# Patient Record
Sex: Female | Born: 1935 | Race: White | Hispanic: No | Smoking: Never smoker
Health system: Southern US, Community
[De-identification: ages and names within clinical notes are randomized; demographics above are authoritative.]

## PROBLEM LIST (undated history)

## (undated) DIAGNOSIS — E785 Hyperlipidemia, unspecified: Secondary | ICD-10-CM

## (undated) DIAGNOSIS — R011 Cardiac murmur, unspecified: Secondary | ICD-10-CM

## (undated) DIAGNOSIS — Z950 Presence of cardiac pacemaker: Secondary | ICD-10-CM

## (undated) DIAGNOSIS — F32A Depression, unspecified: Secondary | ICD-10-CM

## (undated) DIAGNOSIS — M179 Osteoarthritis of knee, unspecified: Secondary | ICD-10-CM

## (undated) DIAGNOSIS — I499 Cardiac arrhythmia, unspecified: Secondary | ICD-10-CM

## (undated) DIAGNOSIS — M1710 Unilateral primary osteoarthritis, unspecified knee: Secondary | ICD-10-CM

## (undated) DIAGNOSIS — C801 Malignant (primary) neoplasm, unspecified: Secondary | ICD-10-CM

## (undated) DIAGNOSIS — Z86010 Personal history of colonic polyps: Secondary | ICD-10-CM

## (undated) DIAGNOSIS — Z860101 Personal history of adenomatous and serrated colon polyps: Secondary | ICD-10-CM

## (undated) DIAGNOSIS — F329 Major depressive disorder, single episode, unspecified: Secondary | ICD-10-CM

## (undated) HISTORY — PX: CHOLECYSTECTOMY: SHX55

## (undated) HISTORY — DX: Personal history of colonic polyps: Z86.010

## (undated) HISTORY — DX: Personal history of adenomatous and serrated colon polyps: Z86.0101

## (undated) HISTORY — DX: Hyperlipidemia, unspecified: E78.5

## (undated) HISTORY — PX: CHOLECYSTECTOMY, LAPAROSCOPIC: SHX56

## (undated) HISTORY — DX: Depression, unspecified: F32.A

## (undated) HISTORY — DX: Osteoarthritis of knee, unspecified: M17.9

## (undated) HISTORY — PX: KNEE ARTHROSCOPY: SHX127

## (undated) HISTORY — PX: ATRIAL FLUTTER ABLATION: SHX5733

## (undated) HISTORY — PX: TOOTH EXTRACTION: SUR596

## (undated) HISTORY — DX: Unilateral primary osteoarthritis, unspecified knee: M17.10

## (undated) HISTORY — DX: Major depressive disorder, single episode, unspecified: F32.9

## (undated) HISTORY — PX: OTHER SURGICAL HISTORY: SHX169

---

## 1998-01-10 ENCOUNTER — Emergency Department (HOSPITAL_COMMUNITY): Admission: EM | Admit: 1998-01-10 | Discharge: 1998-01-10 | Payer: Self-pay | Admitting: Emergency Medicine

## 2000-04-02 ENCOUNTER — Other Ambulatory Visit: Admission: RE | Admit: 2000-04-02 | Discharge: 2000-04-02 | Payer: Self-pay | Admitting: Obstetrics & Gynecology

## 2001-04-07 ENCOUNTER — Other Ambulatory Visit: Admission: RE | Admit: 2001-04-07 | Discharge: 2001-04-07 | Payer: Self-pay | Admitting: Obstetrics & Gynecology

## 2002-06-30 ENCOUNTER — Other Ambulatory Visit: Admission: RE | Admit: 2002-06-30 | Discharge: 2002-06-30 | Payer: Self-pay | Admitting: Obstetrics & Gynecology

## 2002-12-21 ENCOUNTER — Other Ambulatory Visit: Admission: RE | Admit: 2002-12-21 | Discharge: 2002-12-21 | Payer: Self-pay | Admitting: Obstetrics & Gynecology

## 2003-02-26 ENCOUNTER — Ambulatory Visit (HOSPITAL_COMMUNITY): Admission: RE | Admit: 2003-02-26 | Discharge: 2003-02-26 | Payer: Self-pay | Admitting: Gastroenterology

## 2003-07-02 ENCOUNTER — Other Ambulatory Visit: Admission: RE | Admit: 2003-07-02 | Discharge: 2003-07-02 | Payer: Self-pay | Admitting: Obstetrics & Gynecology

## 2004-03-27 ENCOUNTER — Other Ambulatory Visit: Admission: RE | Admit: 2004-03-27 | Discharge: 2004-03-27 | Payer: Self-pay | Admitting: Obstetrics & Gynecology

## 2004-06-15 ENCOUNTER — Encounter (INDEPENDENT_AMBULATORY_CARE_PROVIDER_SITE_OTHER): Payer: Self-pay | Admitting: *Deleted

## 2004-06-15 ENCOUNTER — Ambulatory Visit (HOSPITAL_COMMUNITY): Admission: RE | Admit: 2004-06-15 | Discharge: 2004-06-15 | Payer: Self-pay | Admitting: Obstetrics & Gynecology

## 2004-11-28 ENCOUNTER — Other Ambulatory Visit: Admission: RE | Admit: 2004-11-28 | Discharge: 2004-11-28 | Payer: Self-pay | Admitting: Obstetrics & Gynecology

## 2005-01-16 ENCOUNTER — Encounter (INDEPENDENT_AMBULATORY_CARE_PROVIDER_SITE_OTHER): Payer: Self-pay | Admitting: Specialist

## 2005-01-16 ENCOUNTER — Ambulatory Visit (HOSPITAL_COMMUNITY): Admission: RE | Admit: 2005-01-16 | Discharge: 2005-01-16 | Payer: Self-pay | Admitting: General Surgery

## 2005-07-19 ENCOUNTER — Other Ambulatory Visit: Admission: RE | Admit: 2005-07-19 | Discharge: 2005-07-19 | Payer: Self-pay | Admitting: Obstetrics & Gynecology

## 2005-11-19 ENCOUNTER — Encounter: Admission: RE | Admit: 2005-11-19 | Discharge: 2005-11-19 | Payer: Self-pay | Admitting: Internal Medicine

## 2005-11-26 ENCOUNTER — Encounter: Admission: RE | Admit: 2005-11-26 | Discharge: 2005-11-26 | Payer: Self-pay | Admitting: Internal Medicine

## 2005-12-07 ENCOUNTER — Encounter: Admission: RE | Admit: 2005-12-07 | Discharge: 2005-12-07 | Payer: Self-pay | Admitting: Internal Medicine

## 2008-03-30 LAB — HM COLONOSCOPY

## 2008-06-11 HISTORY — PX: BREAST EXCISIONAL BIOPSY: SUR124

## 2009-02-22 ENCOUNTER — Encounter (INDEPENDENT_AMBULATORY_CARE_PROVIDER_SITE_OTHER): Payer: Self-pay | Admitting: Diagnostic Radiology

## 2009-02-22 ENCOUNTER — Encounter: Admission: RE | Admit: 2009-02-22 | Discharge: 2009-02-22 | Payer: Self-pay | Admitting: Obstetrics & Gynecology

## 2009-03-02 ENCOUNTER — Encounter: Admission: RE | Admit: 2009-03-02 | Discharge: 2009-03-02 | Payer: Self-pay | Admitting: Obstetrics & Gynecology

## 2009-03-21 ENCOUNTER — Encounter: Admission: RE | Admit: 2009-03-21 | Discharge: 2009-03-21 | Payer: Self-pay | Admitting: Surgery

## 2009-03-22 ENCOUNTER — Ambulatory Visit (HOSPITAL_BASED_OUTPATIENT_CLINIC_OR_DEPARTMENT_OTHER): Admission: RE | Admit: 2009-03-22 | Discharge: 2009-03-22 | Payer: Self-pay | Admitting: Surgery

## 2009-03-22 ENCOUNTER — Encounter: Admission: RE | Admit: 2009-03-22 | Discharge: 2009-03-22 | Payer: Self-pay | Admitting: Surgery

## 2009-03-22 ENCOUNTER — Encounter (INDEPENDENT_AMBULATORY_CARE_PROVIDER_SITE_OTHER): Payer: Self-pay | Admitting: Surgery

## 2009-03-25 ENCOUNTER — Ambulatory Visit: Payer: Self-pay | Admitting: Oncology

## 2009-03-30 LAB — CBC WITH DIFFERENTIAL/PLATELET
Basophils Absolute: 0.1 10*3/uL (ref 0.0–0.1)
EOS%: 3.7 % (ref 0.0–7.0)
HGB: 13.5 g/dL (ref 11.6–15.9)
MCH: 35.3 pg — ABNORMAL HIGH (ref 25.1–34.0)
NEUT#: 5.3 10*3/uL (ref 1.5–6.5)
RDW: 13.1 % (ref 11.2–14.5)
WBC: 8.4 10*3/uL (ref 3.9–10.3)
lymph#: 1.7 10*3/uL (ref 0.9–3.3)

## 2009-03-30 LAB — COMPREHENSIVE METABOLIC PANEL
AST: 18 U/L (ref 0–37)
Albumin: 3.3 g/dL — ABNORMAL LOW (ref 3.5–5.2)
Alkaline Phosphatase: 57 U/L (ref 39–117)
BUN: 15 mg/dL (ref 6–23)
Creatinine, Ser: 0.69 mg/dL (ref 0.40–1.20)
Glucose, Bld: 93 mg/dL (ref 70–99)
Potassium: 3.9 mEq/L (ref 3.5–5.3)
Total Bilirubin: 1 mg/dL (ref 0.3–1.2)

## 2009-03-31 LAB — VITAMIN D 25 HYDROXY (VIT D DEFICIENCY, FRACTURES): Vit D, 25-Hydroxy: 34 ng/mL (ref 30–89)

## 2009-03-31 LAB — CANCER ANTIGEN 27.29: CA 27.29: 15 U/mL (ref 0–39)

## 2009-06-11 DIAGNOSIS — C50919 Malignant neoplasm of unspecified site of unspecified female breast: Secondary | ICD-10-CM | POA: Insufficient documentation

## 2009-07-29 ENCOUNTER — Ambulatory Visit: Payer: Self-pay | Admitting: Oncology

## 2009-08-25 ENCOUNTER — Ambulatory Visit: Payer: Self-pay | Admitting: Internal Medicine

## 2009-09-04 DIAGNOSIS — F329 Major depressive disorder, single episode, unspecified: Secondary | ICD-10-CM

## 2009-09-04 DIAGNOSIS — E785 Hyperlipidemia, unspecified: Secondary | ICD-10-CM

## 2009-09-04 DIAGNOSIS — R609 Edema, unspecified: Secondary | ICD-10-CM | POA: Insufficient documentation

## 2009-09-04 DIAGNOSIS — K8020 Calculus of gallbladder without cholecystitis without obstruction: Secondary | ICD-10-CM | POA: Insufficient documentation

## 2009-09-04 DIAGNOSIS — Z860100 Personal history of colon polyps, unspecified: Secondary | ICD-10-CM | POA: Insufficient documentation

## 2009-09-04 DIAGNOSIS — M1710 Unilateral primary osteoarthritis, unspecified knee: Secondary | ICD-10-CM

## 2009-09-04 DIAGNOSIS — M179 Osteoarthritis of knee, unspecified: Secondary | ICD-10-CM | POA: Insufficient documentation

## 2009-09-04 DIAGNOSIS — D0590 Unspecified type of carcinoma in situ of unspecified breast: Secondary | ICD-10-CM

## 2009-10-14 ENCOUNTER — Ambulatory Visit: Payer: Self-pay | Admitting: Internal Medicine

## 2010-01-25 ENCOUNTER — Telehealth: Payer: Self-pay | Admitting: Internal Medicine

## 2010-01-26 ENCOUNTER — Encounter: Payer: Self-pay | Admitting: Internal Medicine

## 2010-01-26 ENCOUNTER — Ambulatory Visit: Payer: Self-pay | Admitting: Internal Medicine

## 2010-01-26 DIAGNOSIS — M549 Dorsalgia, unspecified: Secondary | ICD-10-CM

## 2010-01-26 DIAGNOSIS — N390 Urinary tract infection, site not specified: Secondary | ICD-10-CM

## 2010-01-26 LAB — CONVERTED CEMR LAB
Nitrite: NEGATIVE
Total Protein, Urine: 100 mg/dL
pH: 6.5 (ref 5.0–8.0)

## 2010-03-23 ENCOUNTER — Encounter: Admission: RE | Admit: 2010-03-23 | Discharge: 2010-03-23 | Payer: Self-pay | Admitting: Obstetrics & Gynecology

## 2010-04-11 ENCOUNTER — Ambulatory Visit: Payer: Self-pay | Admitting: Oncology

## 2010-04-13 LAB — COMPREHENSIVE METABOLIC PANEL
ALT: 17 U/L (ref 0–35)
AST: 19 U/L (ref 0–37)
Alkaline Phosphatase: 44 U/L (ref 39–117)
BUN: 13 mg/dL (ref 6–23)
Chloride: 106 mEq/L (ref 96–112)
Creatinine, Ser: 0.81 mg/dL (ref 0.40–1.20)

## 2010-04-13 LAB — CBC WITH DIFFERENTIAL/PLATELET
BASO%: 0.5 % (ref 0.0–2.0)
Basophils Absolute: 0.1 10*3/uL (ref 0.0–0.1)
EOS%: 0.6 % (ref 0.0–7.0)
HCT: 40 % (ref 34.8–46.6)
HGB: 13.9 g/dL (ref 11.6–15.9)
MCH: 34.7 pg — ABNORMAL HIGH (ref 25.1–34.0)
MCHC: 34.7 g/dL (ref 31.5–36.0)
MCV: 100 fL (ref 79.5–101.0)
MONO%: 9.2 % (ref 0.0–14.0)
NEUT%: 67.7 % (ref 38.4–76.8)
RDW: 13.4 % (ref 11.2–14.5)
lymph#: 2.4 10*3/uL (ref 0.9–3.3)

## 2010-04-20 ENCOUNTER — Encounter: Payer: Self-pay | Admitting: Internal Medicine

## 2010-07-13 NOTE — Assessment & Plan Note (Signed)
Summary: acute pain in lower back/norins/pt walk in/lb   Vital Signs:  Patient profile:   75 year old female Height:      63 inches Weight:      161 pounds BMI:     28.62 O2 Sat:      97 % on Room air Temp:     99.5 degrees F oral Pulse rate:   75 / minute Pulse rhythm:   regular Resp:     16 per minute BP sitting:   120 / 78  (left arm) Cuff size:   regular  Vitals Entered By: Bill Salinas CMA (January 26, 2010 9:27 AM)  Nutrition Counseling: Patient's BMI is greater than 25 and therefore counseled on weight management options.  O2 Flow:  Room air CC: pt here with c/o low back pain/ ab, Back pain, Dysuria Is Patient Diabetic? No Pain Assessment Patient in pain? yes     Location: lower back Intensity: 3 Type: dull Onset of pain  today   Primary Care Provider:  Jacques Navy MD  CC:  pt here with c/o low back pain/ ab, Back pain, and Dysuria.  History of Present Illness:  Back Pain      This is a 75 year old woman who presents with Back pain.  The symptoms began 1 day ago.  The intensity is described as moderate.  The patient reports dysuria, but denies fever, chills, weakness, loss of sensation, fecal incontinence, urinary incontinence, urinary retention, rest pain, inability to work, and inability to care for self.  The pain is located in the mid low back.  The pain began at home and gradually.  The pain is made worse by activity.  The pain is made better by inactivity.  Risk factors for serious underlying conditions include age >= 50 years and history of osteoporosis.    Dysuria      The patient also presents with Dysuria.  The symptoms began 2 days ago.  The intensity is described as mild.  The patient reports burning with urination, urinary frequency, and urgency, but denies hematuria, vaginal discharge, vaginal itching, and vaginal sores.  Associated symptoms include back pain.  The patient denies the following associated symptoms: nausea, vomiting, fever, shaking  chills, flank pain, abdominal pain, pelvic pain, and arthralgias.  The patient denies the following risk factors: diabetes, prior antibiotics, immunosuppression, history of GU anomaly, history of pyelonephritis, pregnancy, history of STD, and analgesic abuse.  History is significant for no urinary tract problems.    Preventive Screening-Counseling & Management  Alcohol-Tobacco     Alcohol drinks/day: 0     Smoking Status: never  Hep-HIV-STD-Contraception     Hepatitis Risk: no risk noted     HIV Risk: no risk noted     STD Risk: no risk noted      Sexual History:  currently monogamous.        Drug Use:  never.        Blood Transfusions:  no.    Medications Prior to Update: 1)  Zocor 5 Mg Tabs (Simvastatin) .Marland Kitchen.. 1 Daily 2)  Tamoxifen Citrate 20 Mg Tabs (Tamoxifen Citrate) .Marland Kitchen.. 1 Tab Daily 3)  Fluoxetine Hcl 10 Mg Caps (Fluoxetine Hcl) .Marland Kitchen.. 1 Cap Daily 4)  Triamterene-Hctz 37.5-25 Mg Tabs (Triamterene-Hctz) .... Every Other Day  Current Medications (verified): 1)  Zocor 5 Mg Tabs (Simvastatin) .Marland Kitchen.. 1 Daily 2)  Tamoxifen Citrate 20 Mg Tabs (Tamoxifen Citrate) .Marland Kitchen.. 1 Tab Daily 3)  Fluoxetine Hcl 10 Mg Caps (  Fluoxetine Hcl) .Marland Kitchen.. 1 Cap Daily 4)  Triamterene-Hctz 37.5-25 Mg Tabs (Triamterene-Hctz) .... Every Other Day 5)  Ciprofloxacin Hcl 250 Mg Tabs (Ciprofloxacin Hcl) .... One By Mouth Two Times A Day For 7 Days 6)  Tramadol Hcl 50 Mg Tabs (Tramadol Hcl) .Marland Kitchen.. 1-2 By Mouth Qid As Needed For Pain  Allergies (verified): No Known Drug Allergies  Past History:  Past Medical History: Last updated: 09/19/2009 UCD  CARCINOMA IN SITU OF BREAST (ICD-233.0) COLONIC POLYPS, ADENOMATOUS, HX OF (ICD-V12.72) OTHER AND UNSPECIFIED HYPERLIPIDEMIA (ICD-272.4) DEPRESSION, PROLONGED (ICD-309.1) Hx of CHOLELITHIASIS (ICD-574.20) DEGENERATIVE JOINT DISEASE, KNEES, BILATERAL (EAV-409.81)  Past Surgical History: Last updated: 2009/09/19 lumpectomy -remote: benign Arthroscopy-left knee '00  (Rendal) Arthroscopy - right knee '08 Lap chole - '06 [facility] Amos) Left breast lumpectomy - Oct '10  G3P3  Family History: Last updated: 09-19-09 Father - deceased @ 57: liver cancer Mother - deceased @ 15: NIDDM, CAD/MI-fatal Neg - breast or colon cancer  Social History: Last updated: 19-Sep-2009 HSG Married '59 3 daughters - '61, '64, '73; 2 grandchildren work - Museum/gallery exhibitions officer x 20 years, 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 Marriage is in good heatlh exercise - goes to gym 5/wk  Risk Factors: Alcohol Use: 0 (01/26/2010) Diet: pt planning to start new diet changes under recommendation of personal trainer (10/14/2009) Exercise: yes (10/14/2009)  Risk Factors: Smoking Status: never (01/26/2010)  Family History: Reviewed history from 19-Sep-2009 and no changes required. Father - deceased @ 23: liver cancer Mother - deceased @ 20: NIDDM, CAD/MI-fatal Neg - breast or colon cancer  Social History: Reviewed history from 09/19/09 and no changes required. 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 Marriage is in good heatlh exercise - goes to gym 5/wk Hepatitis Risk:  no risk noted HIV Risk:  no risk noted STD Risk:  no risk noted Sexual History:  currently monogamous Drug Use:  never Blood Transfusions:  no  Review of Systems  The patient denies anorexia, fever, weight loss, weight gain, chest pain, syncope, dyspnea on exertion, peripheral edema, prolonged cough, headaches, hemoptysis, abdominal pain, melena, hematuria, incontinence, suspicious skin lesions, difficulty walking, abnormal bleeding, enlarged lymph nodes, and angioedema.    Physical Exam  General:  alert, well-developed, well-nourished, well-hydrated, appropriate dress, normal appearance, healthy-appearing, cooperative to examination, good hygiene, and overweight-appearing.   Head:  normocephalic, atraumatic, no abnormalities observed,  and no abnormalities palpated.   Eyes:  vision grossly intact, pupils equal, pupils round, and pupils reactive to light.   Ears:  R ear normal and L ear normal.   Nose:  External nasal examination shows no deformity or inflammation. Nasal mucosa are pink and moist without lesions or exudates. Mouth:  Oral mucosa and oropharynx without lesions or exudates.  Teeth in good repair. Neck:  supple, full ROM, no masses, no thyromegaly, no thyroid nodules or tenderness, and normal carotid upstroke.   Lungs:  normal respiratory effort, no intercostal retractions, no accessory muscle use, normal breath sounds, no dullness, no fremitus, no crackles, and no wheezes.   Heart:  normal rate, regular rhythm, no murmur, no gallop, no rub, and no JVD.   Abdomen:  soft, non-tender, normal bowel sounds, no distention, no masses, no guarding, no rigidity, no rebound tenderness, no abdominal hernia, no inguinal hernia, no hepatomegaly, and no splenomegaly.  No CVAT. Msk:  normal ROM, no joint tenderness, no joint swelling, no joint warmth, no redness over joints, no joint deformities, no joint instability, no crepitation,  and no muscle atrophy.   Pulses:  R and L carotid,radial,femoral,dorsalis pedis and posterior tibial pulses are full and equal bilaterally Extremities:  No clubbing, cyanosis, edema, or deformity noted with normal full range of motion of all joints.   Neurologic:  No cranial nerve deficits noted. Station and gait are normal. Plantar reflexes are down-going bilaterally. DTRs are symmetrical throughout. Sensory, motor and coordinative functions appear intact. Skin:  turgor normal, color normal, no rashes, no suspicious lesions, no ecchymoses, no petechiae, no purpura, no ulcerations, and no edema.   Cervical Nodes:  no anterior cervical adenopathy and no posterior cervical adenopathy.   Axillary Nodes:  no R axillary adenopathy and no L axillary adenopathy.   Inguinal Nodes:  no R inguinal adenopathy and  no L inguinal adenopathy.   Psych:  Cognition and judgment appear intact. Alert and cooperative with normal attention span and concentration. No apparent delusions, illusions, hallucinations   Detailed Back/Spine Exam  General:    obese.    Gait:    Normal heel-toe gait pattern bilaterally.    Lumbosacral Exam:  Inspection-deformity:    Normal Palpation-spinal tenderness:  Normal Range of Motion:    Forward Flexion:   85 degrees    Hyperextension:   30 degrees    Right Lateral Bend:   30 degrees    Left Lateral Bend:   30 degrees Squatting:  normal Lying Straight Leg Raise:    Right:  negative    Left:  negative Sitting Straight Leg Raise:    Right:  negative    Left:  negative Contralateral Straight Leg Raise:    Right:  negative    Left:  negative Sciatic Notch:    There is no sciatic notch tenderness. Toe Walking:    Right:  normal    Left:  normal Heel Walking:    Right:  normal    Left:  normal   Impression & Recommendations:  Problem # 1:  UTI (ICD-599.0)  UA shows TNTC WBC's and some RBC's Her updated medication list for this problem includes:    Ciprofloxacin Hcl 250 Mg Tabs (Ciprofloxacin hcl) ..... One by mouth two times a day for 7 days  Encouraged to push clear liquids, get enough rest, and take acetaminophen as needed. To be seen in 10 days if no improvement, sooner if worse.  Problem # 2:  BACK PAIN (ICD-724.5)  Orders: T-Lumbar Spine Complete, 5 Views (81191YN) Admin of Therapeutic Inj  intramuscular or subcutaneous (82956) Ketorolac-Toradol 15mg  (O1308)  Her updated medication list for this problem includes:    Tramadol Hcl 50 Mg Tabs (Tramadol hcl) .Marland Kitchen... 1-2 by mouth qid as needed for pain  Discussed use of moist heat or ice, modified activities, medications, and stretching/strengthening exercises. Back care instructions given. To be seen in 2 weeks if no improvement; sooner if worsening of symptoms.   Complete Medication List: 1)   Zocor 5 Mg Tabs (Simvastatin) .Marland Kitchen.. 1 daily 2)  Tamoxifen Citrate 20 Mg Tabs (Tamoxifen citrate) .Marland Kitchen.. 1 tab daily 3)  Fluoxetine Hcl 10 Mg Caps (Fluoxetine hcl) .Marland Kitchen.. 1 cap daily 4)  Triamterene-hctz 37.5-25 Mg Tabs (Triamterene-hctz) .... Every other day 5)  Ciprofloxacin Hcl 250 Mg Tabs (Ciprofloxacin hcl) .... One by mouth two times a day for 7 days 6)  Tramadol Hcl 50 Mg Tabs (Tramadol hcl) .Marland Kitchen.. 1-2 by mouth qid as needed for pain  Patient Instructions: 1)  Please schedule a follow-up appointment in 2 weeks. 2)  Take 650-1000mg  of Tylenol every  4-6 hours as needed for relief of pain or comfort of fever AVOID taking more than 4000mg   in a 24 hour period (can cause liver damage in higher doses). 3)  Take 400-600mg  of Ibuprofen (Advil, Motrin) with food every 4-6 hours as needed for relief of pain or comfort of fever. 4)  Take your antibiotic as prescribed until ALL of it is gone, but stop if you develop a rash or swelling and contact our office as soon as possible. 5)  Most patients (90%) with low back pain will improve with time (2-6 weeks). Keep active but avoid activities that are painful. Apply moist heat and/or ice to lower back several times a day. Prescriptions: TRAMADOL HCL 50 MG TABS (TRAMADOL HCL) 1-2 by mouth QID as needed for pain  #35 x 1   Entered and Authorized by:   Etta Grandchild MD   Signed by:   Etta Grandchild MD on 01/26/2010   Method used:   Print then Give to Patient   RxID:   [id number] CIPROFLOXACIN HCL 250 MG TABS (CIPROFLOXACIN HCL) One by mouth two times a day for 7 days  #14 x 0   Entered and Authorized by:   Etta Grandchild MD   Signed by:   Etta Grandchild MD on 01/26/2010   Method used:   Print then Give to Patient   RxID:   [id number]    Immunization History:  Tetanus/Td Immunization History:    Tetanus/Td:  historical (08/11/2002)  Influenza Immunization History:    Influenza:  historical (03/15/2009)   Medication  Administration  Injection # 1:    Medication: Ketorolac-Toradol 15mg     Diagnosis: BACK PAIN (ICD-724.5)    Route: IM    Site: RUOQ gluteus    Exp Date: 10/10/2010    Lot #: [id number]    Mfr: [email or web address]    Comments: Patient got 60mg     Patient tolerated injection without complications    Given by: Rock Nephew CMA (January 26, 2010 10:04 AM)  Orders Added: 1)  T-Lumbar Spine Complete, 5 Views [71110TC] 2)  Admin of Therapeutic Inj  intramuscular or subcutaneous [96372] 3)  Ketorolac-Toradol 15mg  [J1885] 4)  Est. Patient Level IV [64403]

## 2010-07-13 NOTE — Assessment & Plan Note (Signed)
Summary: re-establish it has been a while per pt/cd - ok MEN.   Vital Signs:  Patient profile:   75 year old female Height:      63 inches Weight:      170 pounds BMI:     30.22 O2 Sat:      98 % on Room air Temp:     97.9 degrees F oral Pulse rate:   70 / minute BP sitting:   124 / 72  (left arm) Cuff size:   regular  Vitals Entered By: Bill Salinas CMA (August 25, 2009 11:55 AM)  O2 Flow:  Room air CC: pt here t re- est care with primary/ ab   CC:  pt here t re- est care with primary/ ab.  History of Present Illness: Ms. Delia presents on the recommendation of her oncologist, Dr. Darnelle Catalan, to reestablish for on-going continuity care. She was last seens April '00. In the interval she has been diagnosed and treated for breast cancer with lumpectomy and adjuvant therapy. By her report she has not had any other major medical problems and is doing well at this time. Her medical record is pulled from storage for complete review in addition to a full history interview today.   Current Medications (verified): 1)  Zocor 5 Mg Tabs (Simvastatin) .Marland Kitchen.. 1 Daily 2)  Tamoxifen Citrate 20 Mg Tabs (Tamoxifen Citrate) .Marland Kitchen.. 1 Tab Daily 3)  Fluoxetine Hcl 10 Mg Caps (Fluoxetine Hcl) .Marland Kitchen.. 1 Cap Daily 4)  Triamterene-Hctz 37.5-25 Mg Tabs (Triamterene-Hctz) .... Every Other Day  Allergies (verified): No Known Drug Allergies  Past History:  Past Medical History: UCD  CARCINOMA IN SITU OF BREAST (ICD-233.0) COLONIC POLYPS, ADENOMATOUS, HX OF (ICD-V12.72) OTHER AND UNSPECIFIED HYPERLIPIDEMIA (ICD-272.4) DEPRESSION, PROLONGED (ICD-309.1) Hx of CHOLELITHIASIS (ICD-574.20) DEGENERATIVE JOINT DISEASE, KNEES, BILATERAL (ZOX-096.04)  Past Surgical History: lumpectomy -remote: benign Arthroscopy-left knee '00 (Rendal) Arthroscopy - right knee '08 Lap chole - '06 [facility]agnes Amos) Left breast lumpectomy - Oct '10  G3P3  Family History: Father - deceased @ 72: liver cancer Mother - deceased @ 68:  NIDDM, CAD/MI-fatal Neg - breast or colon cancer  Social History: HSG Married '59 3 daughters - '61, '64, '73; 2 grandchildren work - Museum/gallery exhibitions officer x 20 years, 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 Marriage is in good heatlh exercise - goes to gym 5/wk  Review of Systems       The patient complains of peripheral edema.  The patient denies anorexia, fever, weight loss, weight gain, decreased hearing, chest pain, syncope, dyspnea on exertion, prolonged cough, abdominal pain, severe indigestion/heartburn, incontinence, muscle weakness, suspicious skin lesions, difficulty walking, abnormal bleeding, and enlarged lymph nodes.    Physical Exam  General:  WNWD white female in no distress Head:  normocephalic and atraumatic.   Eyes:  pupils equal, pupils round, corneas and lenses clear, and no injection.   Ears:  no external deformities.   Nose:  no external deformity and no external erythema.   Lungs:  normal respiratory effort and normal breath sounds.   Heart:  normal rate and regular rhythm.   Neurologic:  alert & oriented X3, cranial nerves II-XII intact, and gait normal.   Skin:  turgor normal and color normal.   Psych:  Oriented X3, memory intact for recent and remote, normally interactive, and good eye contact.     Impression & Recommendations:  Problem # 1:  CARCINOMA IN SITU OF BREAST (ICD-233.0) Doing well. Continues to see Dr. Darnelle Catalan. She is on tamoxfin adjuvant therapy.  Problem #  2:  COLONIC POLYPS, ADENOMATOUS, HX OF (ICD-V12.72) Per patient last colonoscopy Mar 30, 2008  Problem # 3:  OTHER AND UNSPECIFIED HYPERLIPIDEMIA (ICD-272.4) Currently on Zocor 20mg .  Plan - will obtain whatever labs are available and repeat labs as appropriate.  Her updated medication list for this problem includes:    Zocor 5 Mg Tabs (Simvastatin) .Marland Kitchen... 1 daily  Problem # 4:  DEPRESSION, PROLONGED (ICD-309.1) Seems to be in good spirits. She continues to take fluoxetine.  Problem # 5:   DEGENERATIVE JOINT DISEASE, KNEES, BILATERAL (ICD-715.96) She has had knee surgery x 2. She reports that she continues to have a great deal of pain but she does get adequate relief with Aleve.  Problem # 6:  PERIPHERAL EDEMA (ICD-782.3) Patient reports that she continues to have peripheral edema which by description may be venous insufficieincy.  Plan - mechanical treatment with elevation of legs and use of support hose if she is going to be sitting or standing for long periods           Maxzide every other day as needed.  Her updated medication list for this problem includes:    Triamterene-hctz 37.5-25 Mg Tabs (Triamterene-hctz) ..... Every other day  Problem # 7:  Preventive Health Care (ICD-V70.0) Past records reviewed - Mrs. Fitzgerald is apparently up-to-date with colorectal cancer screening and with mammography. Given her age and long-term marriage she may not need routine pelvic/PAP. Will obtain recent labs for review with ordering of labs as appropriate. She is asked to return for general physical exam.   Complete Medication List: 1)  Zocor 5 Mg Tabs (Simvastatin) .Marland Kitchen.. 1 daily 2)  Tamoxifen Citrate 20 Mg Tabs (Tamoxifen citrate) .Marland Kitchen.. 1 tab daily 3)  Fluoxetine Hcl 10 Mg Caps (Fluoxetine hcl) .Marland Kitchen.. 1 cap daily 4)  Triamterene-hctz 37.5-25 Mg Tabs (Triamterene-hctz) .... Every other day

## 2010-07-13 NOTE — Letter (Signed)
Summary: Results Follow-up Letter  [facility]  [address] [place], [place] [address]   Phone: [phone]  Fax: [phone]    01/26/2010  [address] [place], [place]  [address]  Dear Ms. Halbert,   The following are the results of your recent test(s):  Test     Result     Back Xray     slipped disc and arthritis   _________________________________________________________  Please call for an appointment in 2 weeks _________________________________________________________ _________________________________________________________ _________________________________________________________  Sincerely,  Sanda Linger MD [facility]

## 2010-07-13 NOTE — Progress Notes (Signed)
Summary: U/A?  Phone Note Call from Patient Call back at [facility] Phone [phone]   Summary of Call: Patient c/o urinary frequency. U/a? or office visit?  Initial call taken by: Lamar Sprinkles, CMA,  January 25, 2010 11:16 AM  Follow-up for Phone Call        U/A  595.0 Follow-up by: Jacques Navy MD,  January 25, 2010 12:28 PM  Additional Follow-up for Phone Call Additional follow up Details #1::        Pt informed, order in idx. Hold phone note for results. Additional Follow-up by: Lamar Sprinkles, CMA,  January 25, 2010 3:35 PM    Additional Follow-up for Phone Call Additional follow up Details #2::    Patient was given cipro at office visit today w/Dr Yetta Barre. Follow-up by: Lamar Sprinkles, CMA,  January 26, 2010 12:29 PM

## 2010-07-13 NOTE — Assessment & Plan Note (Signed)
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 She also states that she has had pnuemonia vaccine, she did not have them here and is unsure of dates. 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 She also states that she has had pnuemonia vaccine, and she did not have them here and is unsure of dates. Pt has yearly mammograms and gets annual paps with Dr Aldona Bar ab.  History of Present Illness: Patient is here for follow-up after re-establishing care in March.  She has had no changes in her health since her previous visit and has no current concerns about her health.  Her history was reviewed with her in detail and was found to be correct.  Preventive Screening-Counseling & Management  Alcohol-Tobacco     Smoking Status: never  Caffeine-Diet-Exercise     Diet Comments: pt planning to start new diet changes under recommendation of personal trainer     Does Patient Exercise: yes     Type of exercise: pt planning to start a new exercise program with a personal trainer     Times/week: 5  Current Medications (verified): 1)  Zocor 5 Mg Tabs (Simvastatin) .Marland Kitchen.. 1 Daily 2)  Tamoxifen Citrate 20 Mg Tabs (Tamoxifen Citrate) .Marland Kitchen.. 1 Tab Daily 3)  Fluoxetine Hcl 10 Mg Caps (Fluoxetine Hcl) .Marland Kitchen.. 1 Cap Daily 4)  Triamterene-Hctz 37.5-25 Mg Tabs (Triamterene-Hctz) .... Every Other Day  Allergies (verified): No Known Drug Allergies  Past History:  Past Medical History: Last updated: 09-22-09 UCD    CARCINOMA IN SITU OF BREAST (ICD-233.0) COLONIC POLYPS, ADENOMATOUS, HX OF (ICD-V12.72) OTHER AND UNSPECIFIED HYPERLIPIDEMIA (ICD-272.4) DEPRESSION, PROLONGED (ICD-309.1) Hx of CHOLELITHIASIS (ICD-574.20) DEGENERATIVE JOINT DISEASE, KNEES, BILATERAL (ZOX-096.04)  Past Surgical History: Last updated: 09/22/2009 lumpectomy -remote: benign Arthroscopy-left knee '00 (Rendal) Arthroscopy - right knee '08 Lap chole - '06 Maryagnes Amos) Left breast lumpectomy - Oct '10  G3P3  Family History: Last updated: September 22, 2009 Father - deceased @ 37: liver cancer Mother - deceased @ 56: NIDDM, CAD/MI-fatal Neg - breast or colon cancer  Social History: Last updated: 09-22-2009 HSG Married '59 3 daughters - '61, '64, '73; 2 grandchildren work - Museum/gallery exhibitions officer x 20 years, 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 Marriage is in good heatlh exercise - goes to gym 5/wk  Social History: Smoking Status:  never Does Patient Exercise:  yes  Review of Systems       The patient complains of weight gain and peripheral edema.  The patient denies anorexia, fever, weight loss, chest pain, dyspnea on exertion, and abdominal pain.         Peripheral edema is a long term issue- no recent worsening.  Physical Exam  General:  alert, well-developed, and healthy-appearing.   Head:  normocephalic and  atraumatic.   Eyes:  vision grossly intact, pupils equal, pupils round, pupils reactive to light, pupils react to accomodation, and no injection.   Ears:  R ear normal and L ear normal.   Nose:  no external deformity.   Mouth:  good dentition and pharynx pink and moist.   Neck:  supple, no masses, no thyromegaly, and no thyroid nodules or tenderness.   Lungs:  normal respiratory effort, no accessory muscle use, normal breath sounds, no crackles, and no wheezes.   Heart:  normal rate, regular rhythm, no murmur, no gallop, and no rub.   Abdomen:  soft, non-tender, normal bowel sounds, no distention, no masses, no guarding, and no  rigidity.   Msk:  small nodule at right index DIP joint Pulses:  R radial normal, R posterior tibial normal, L radial normal, and L posterior tibial normal.   Extremities:  no pedal edema noted Neurologic:  alert & oriented X3, cranial nerves II-XII intact, and gait normal.   Skin:  turgor normal.   Cervical Nodes:  no anterior cervical adenopathy and no posterior cervical adenopathy.   Psych:  Oriented X3, memory intact for recent and remote, normally interactive, and not depressed appearing.     Impression & Recommendations:  Problem # 1:  CARCINOMA IN SITU OF BREAST (ICD-233.0) s/p lumpectomy Oct 2010.  Followed by Dr. Darnelle Catalan  Problem # 2:  Preventive Health Care (ICD-V70.0) Colon screening- History of adenomatous polyps.  Most recent colonoscopy in Oct 2009  Breast screening- Breast exam and yearly mammogram with Dr. Aldona Bar.  Pap- Completed yearly by Dr. Aldona Bar.    Vaccinations- Patient believes she has received the pneumovax.  Was given a prescription today to get the Zostavax.  Eye exam one month ago with no new problems.  Dental exam within the past month with no problems.  Problem # 3:  DEGENERATIVE JOINT DISEASE, KNEES, BILATERAL (ICD-715.96) Patient continues to have bilateral knee pain.  She has been taking Aleve as needed or when she is planning to be on her feet more than usual.  She was counseled that it is acceptable to take Aleve daily to reduce pain.  She expressed concerns about possible stomach side effects and was told that she could take OTC Pepcid or Zantac daily to help prevent damage to her stomach.  Problem # 4:  PERIPHERAL EDEMA (ICD-782.3) She reports her edema to be fairly stable.  She is not noted to have any swelling on exam today.  She takes Maxzide approximately every other day to reduce swelling.     Her updated medication list for this problem includes:    Triamterene-hctz 37.5-25 Mg Tabs (Triamterene-hctz) ..... Every other day  Complete  Medication List: 1)  Zocor 5 Mg Tabs (Simvastatin) .Marland Kitchen.. 1 daily 2)  Tamoxifen Citrate 20 Mg Tabs (Tamoxifen citrate) .Marland Kitchen.. 1 tab daily 3)  Fluoxetine Hcl 10 Mg Caps (Fluoxetine hcl) .Marland Kitchen.. 1 cap daily 4)  Triamterene-hctz 37.5-25 Mg Tabs (Triamterene-hctz) .... Every other day   Preventive Care Screening  Colonoscopy:    Date:  03/30/2008    Results:  Adenomatous Polyp

## 2010-07-18 ENCOUNTER — Other Ambulatory Visit: Payer: Self-pay | Admitting: Oncology

## 2010-07-18 DIAGNOSIS — C50919 Malignant neoplasm of unspecified site of unspecified female breast: Secondary | ICD-10-CM

## 2010-07-26 ENCOUNTER — Ambulatory Visit (HOSPITAL_COMMUNITY): Payer: Self-pay

## 2010-07-26 ENCOUNTER — Encounter (HOSPITAL_COMMUNITY): Payer: Self-pay

## 2010-07-27 NOTE — Letter (Signed)
Summary: [facility]  [facility]   Imported By: Sherian Rein 07/20/2010 07:29:20  _____________________________________________________________________  External Attachment:    Type:   Image     Comment:   External Document

## 2010-08-02 ENCOUNTER — Encounter (HOSPITAL_COMMUNITY): Payer: Self-pay

## 2010-08-02 ENCOUNTER — Ambulatory Visit (HOSPITAL_COMMUNITY)
Admission: RE | Admit: 2010-08-02 | Discharge: 2010-08-02 | Disposition: A | Discharge status: Home / Self Care | Payer: MEDICARE | Source: Ambulatory Visit | Attending: Oncology | Admitting: Oncology

## 2010-08-02 ENCOUNTER — Encounter (HOSPITAL_COMMUNITY)
Admission: RE | Admit: 2010-08-02 | Discharge: 2010-08-02 | Disposition: A | Discharge status: Home / Self Care | Payer: Medicare Other | Source: Ambulatory Visit | Attending: Oncology | Admitting: Oncology

## 2010-08-02 DIAGNOSIS — Z09 Encounter for follow-up examination after completed treatment for conditions other than malignant neoplasm: Secondary | ICD-10-CM | POA: Insufficient documentation

## 2010-08-02 DIAGNOSIS — Z853 Personal history of malignant neoplasm of breast: Secondary | ICD-10-CM | POA: Insufficient documentation

## 2010-08-02 DIAGNOSIS — C50919 Malignant neoplasm of unspecified site of unspecified female breast: Secondary | ICD-10-CM

## 2010-08-02 HISTORY — DX: Malignant (primary) neoplasm, unspecified: C80.1

## 2010-08-02 MED ORDER — TECHNETIUM TC 99M MEDRONATE IV KIT
23.5000 | PACK | Freq: Once | INTRAVENOUS | Status: AC | PRN
  Administered 2010-08-02: 24 via INTRAVENOUS

## 2010-09-14 LAB — COMPREHENSIVE METABOLIC PANEL
ALT: 19 U/L (ref 0–35)
BUN: 16 mg/dL (ref 6–23)
CO2: 29 mEq/L (ref 19–32)
Calcium: 9.4 mg/dL (ref 8.4–10.5)
Creatinine, Ser: 0.71 mg/dL (ref 0.4–1.2)
GFR calc non Af Amer: >60 mL/min (ref >60)
Glucose, Bld: 95 mg/dL (ref 70–99)
Total Protein: 6.4 g/dL (ref 6.0–8.3)

## 2010-09-14 LAB — DIFFERENTIAL
Eosinophils Absolute: 0.1 10*3/uL (ref 0.0–0.7)
Lymphs Abs: 1.7 10*3/uL (ref 0.7–4.0)
Neutro Abs: 4.9 10*3/uL (ref 1.7–7.7)
Neutrophils Relative %: 66 % (ref 43–77)

## 2010-09-14 LAB — CBC
HCT: 41.1 % (ref 36.0–46.0)
Hemoglobin: 14.2 g/dL (ref 12.0–15.0)
MCHC: 34.5 g/dL (ref 30.0–36.0)
MCV: 102.8 fL — ABNORMAL HIGH (ref 78.0–100.0)
RBC: 4 MIL/uL (ref 3.87–5.11)
RDW: 13.4 % (ref 11.5–15.5)

## 2010-10-19 ENCOUNTER — Other Ambulatory Visit: Payer: Self-pay | Admitting: Oncology

## 2010-10-19 ENCOUNTER — Encounter (HOSPITAL_BASED_OUTPATIENT_CLINIC_OR_DEPARTMENT_OTHER): Payer: MEDICARE | Admitting: Oncology

## 2010-10-19 DIAGNOSIS — Z9889 Other specified postprocedural states: Secondary | ICD-10-CM

## 2010-10-19 DIAGNOSIS — C50919 Malignant neoplasm of unspecified site of unspecified female breast: Secondary | ICD-10-CM

## 2010-10-19 DIAGNOSIS — C50419 Malignant neoplasm of upper-outer quadrant of unspecified female breast: Secondary | ICD-10-CM

## 2010-10-19 DIAGNOSIS — R232 Flushing: Secondary | ICD-10-CM

## 2010-10-19 LAB — COMPREHENSIVE METABOLIC PANEL
Albumin: 4.2 g/dL (ref 3.5–5.2)
BUN: 15 mg/dL (ref 6–23)
CO2: 21 mEq/L (ref 19–32)
Glucose, Bld: 92 mg/dL (ref 70–99)
Potassium: 4.1 mEq/L (ref 3.5–5.3)
Sodium: 136 mEq/L (ref 135–145)
Total Protein: 6.9 g/dL (ref 6.0–8.3)

## 2010-10-19 LAB — CBC WITH DIFFERENTIAL/PLATELET
Basophils Absolute: 0.1 10*3/uL (ref 0.0–0.1)
Eosinophils Absolute: 0.1 10*3/uL (ref 0.0–0.5)
HGB: 14 g/dL (ref 11.6–15.9)
LYMPH%: 25.5 % (ref 14.0–49.7)
MONO#: 0.8 10*3/uL (ref 0.1–0.9)
NEUT#: 5.1 10*3/uL (ref 1.5–6.5)
Platelets: 184 10*3/uL (ref 145–400)
RBC: 4.11 10*6/uL (ref 3.70–5.45)
RDW: 13 % (ref 11.2–14.5)
WBC: 8 10*3/uL (ref 3.9–10.3)

## 2010-10-27 NOTE — Op Note (Signed)
NAME:  Sheila Schmidt, Sheila Schmidt NO.:  [id number]   MEDICAL RECORD NO.:  [id number]          PATIENT TYPE:  AMB   LOCATION:  SDC                           FACILITY:  [facility]   PHYSICIAN:  Gerrit Friends. Aldona Bar, M.D.   DATE OF BIRTH:  Jul 12, 1935   DATE OF PROCEDURE:  06/15/2004  DATE OF DISCHARGE:                                 OPERATIVE REPORT   PREOPERATIVE DIAGNOSES:  CIN 2 of the uterine cervix, indecisive colposcopy.   POSTOPERATIVE DIAGNOSES:  CIN 2 of the uterine cervix, indecisive  colposcopy.   PATHOLOGY:  Pending.   PROCEDURE:  Conization of the cervix carried out with the LEEP apparatus.   SURGEON:  Gerrit Friends. Aldona Bar, M.D.   ANESTHESIA:  Intravenous conscious sedation and local 1% Xylocaine with  epinephrine circumferentially to the cervix.   HISTORY:  This 75 year old female has had through the years some atypical  squamous cells on her Pap smears off and on but in 2005 had a Pap smear  returned as showing moderate dysplasia. She underwent colposcopy which was  indecisive--the transformation zone was not well visualized and the  endocervical curetting produced some atypical cells. She is now taken to the  operating room for conization of her cervix for further definition of her  problem as well as hopeful treatment of her problem.   Once in the operating room, the patient was placed in the modified lithotomy  position in short Allen stirrups. She was prepped and draped and her bladder  was drained.  A speculum was placed. The cervix did not pull down well. A  single tooth tenaculum was placed anteriorly and then posteriorly and  actually worked better posteriorly. The cervix was then circumferentially  injected with 25 mL of 1% Xylocaine without epinephrine.  The endocervix  appeared to sound to a depth of approximately 1 to 1 1/2 cm.  Thereafter  using the large electrode wire LEEP conization was carried out. The first  swipe produced the exocervix and some of the  endocervix.  Thereafter using a  smaller wire. The remnants of the endocervix were removed but unfortunately  were fragmented but sent appropriately labeled nonetheless.  Thereafter  sounding of the endocervix found that we were almost at the internal os.  Good coagulation of the cervical bed then was carried out using the ball and  at the conclusion of this everything was noted to be dry.  Nonetheless  Monsel solution was placed in the cervical bed and at this time the  procedure was felt to be completed and was terminated. The specimen was  pinned and labeled appropriately.   The patient was transported to the recovery room in satisfactory condition  having tolerated the procedure well.  She will be discharged to home with  appropriate instructions. She will return to the office for followup in  approximately 3-4 weeks time to look at her cervix. Her left bundle branch  block noted on her preoperative EKG will be addressed by referring her to a  cardiologist. She is asymptomatic at this point.  She will use Advil or  Aleve  as needed for cramping and as mentioned have appropriate instructions  of her followup.   The lesion which apparently was in the endocervix with the amount of  instruction carried out with the electrode ball for coagulation should have  been adequately treated although the specimen itself being that the  endocervical button was somewhat fragmented may limit the end diagnoses.   Arrival in the recovery room satisfactory.      RMW/MEDQ  D:  06/15/2004  T:  06/15/2004  Job:  [id number]

## 2010-10-27 NOTE — Op Note (Signed)
NAME:  Sheila Schmidt, VANBERGEN NO.:  [id number]   MEDICAL RECORD NO.:  [id number]          PATIENT TYPE:  AMB   LOCATION:  DAY                          FACILITY:  [facility]   PHYSICIAN:  Gita Kudo, M.D. DATE OF BIRTH:  [birth year]   DATE OF PROCEDURE:  01/16/2005  DATE OF DISCHARGE:                                 OPERATIVE REPORT   CHIEF COMPLAINT:  Gallstones.   OPERATIVE PROCEDURE:  Laparoscopic cholecystectomy with intraoperative  cholangiogram.   SURGEON:  Gita Kudo, M.D.   ASSISTANT:  Anselm Pancoast. Zachery Dakins, M.D.   ANESTHESIA:  General endotracheal.   PREOPERATIVE DIAGNOSIS:  Gallstones.   POSTOPERATIVE DIAGNOSIS:  Gallstones,  normal cholangiogram.   CLINICAL SUMMARY:  A 75 year old female admitted for elective  cholecystectomy. She has had bouts of abdominal pain. Gallbladder ultrasound  has multiple small stones. She has never been jaundiced.   OPERATIVE FINDINGS:  The gallbladder was thin-walled, had some filmy  adhesions around it. The cholangiogram looked normal - no obstruction or  dilatation. Good filling.   OPERATIVE PROCEDURE:  Under satisfactory general endotracheal anesthesia,  the patient was positioned, prepped and draped in the standard fashion. She  received 1.0 grams Ancef preop.   A total of 30 mL of 0.5% Marcaine was infiltrated for postop analgesia. Then  a midline incision made below the umbilicus, peritoneum entered. Defect  approximated and secured with a #0 Vicryl suture and Hussan port inserted.  Good CO2 pneumoperitoneum was established and camera placed. Under direct  vision, two #5 ports placed laterally and a second #10 medially. Operating  through the medial port, I took adhesions down from the gallbladder and then  the cystic duct identified, circumferentially dissected and incised. A  percutaneous catheter placed, cholangiogram obtained which looked normal.  Following this, the catheter was removed and the duct  controlled with  multiple clips and divided. The cystic artery was likewise dissected,  clipped and divided. The gallbladder then removed from below upward using  coagulating current for hemostasis and dissection. After the gallbladder was  taken off the liver bed, the bed was lavaged with saline, made hemostatic by  cautery, suctioned dry.   Camera moved to the upper port and through the lower port, a large grasper  used to remove the gallbladder intact without spillage or complication.  Operative site again checked, lavaged and suction. Then all CO2 released.  Ports  removed. The wound then closed. Midline approximated with a previous figure-  of-eight and a second #0 Vicryl suture. Following this, the subcu was  approximated with 4-0 Vicryl. Steri-Strips used to approximate the skin. No  complications and the sponge and needle counts correct.       MRL/MEDQ  D:  01/16/2005  T:  01/16/2005  Job:  [id number]

## 2010-10-27 NOTE — Op Note (Signed)
   NAME:  Sheila Schmidt, Sheila Schmidt                         ACCOUNT NO.:  [id number]   MEDICAL RECORD NO.:  [id number]                   PATIENT TYPE:  AMB   LOCATION:  ENDO                                 [facility]AC[facility]  [facility]   PHYSICIAN:  John C. Madilyn Fireman, M.D.                 DATE OF BIRTH:  05-Jan-1936   DATE OF PROCEDURE:  02/26/2003  DATE OF DISCHARGE:                                 OPERATIVE REPORT   PROCEDURE:  Colonoscopy.   INDICATION FOR PROCEDURE:  Colon cancer screening as well as left upper  quadrant pain.   DESCRIPTION OF PROCEDURE:  The patient was placed in the left lateral  decubitus position and placed on the pulse monitor with continuous low-flow  oxygen delivered by nasal cannula.  She was sedated with 100 mcg IV fentanyl  and 10 mg IV Versed.  The Olympus video colonoscope was inserted into the  rectum and advanced to the cecum, confirmed by transillumination at  McBurney's point and visualization of the ileocecal valve and appendiceal  orifice.  The prep was excellent.  The cecum, ascending, transverse,  descending, and sigmoid colon all appeared normal with no masses, polyps,  diverticula, or other mucosal abnormalities.  The rectum likewise appeared  normal, and retroflexed view of the anus revealed no obvious internal  hemorrhoids.  The scope was then withdrawn and the patient returned to the  recovery room in stable condition.  She tolerated the procedure well, and  there were no immediate complications.   IMPRESSION:  Normal colonoscopy.   PLAN:  Next colon screening by flexible sigmoidoscopy in five years.                                               John C. Madilyn Fireman, M.D.    JCH/MEDQ  D:  02/26/2003  T:  02/26/2003  Job:  [id number]   cc:   Gerrit Friends. Aldona Bar, M.D.  [address]  [place]  [place] [address]  Fax: [phone]

## 2010-12-11 ENCOUNTER — Encounter: Payer: Self-pay | Admitting: Internal Medicine

## 2010-12-12 ENCOUNTER — Ambulatory Visit (INDEPENDENT_AMBULATORY_CARE_PROVIDER_SITE_OTHER): Payer: 59 | Admitting: Internal Medicine

## 2010-12-12 VITALS — BP 128/76 | HR 70 | Temp 97.1°F | Wt 168.0 lb

## 2010-12-12 DIAGNOSIS — Z01810 Encounter for preprocedural cardiovascular examination: Secondary | ICD-10-CM

## 2010-12-12 DIAGNOSIS — R9431 Abnormal electrocardiogram [ECG] [EKG]: Secondary | ICD-10-CM

## 2010-12-12 DIAGNOSIS — E785 Hyperlipidemia, unspecified: Secondary | ICD-10-CM

## 2010-12-12 DIAGNOSIS — M1710 Unilateral primary osteoarthritis, unspecified knee: Secondary | ICD-10-CM

## 2010-12-12 DIAGNOSIS — Z136 Encounter for screening for cardiovascular disorders: Secondary | ICD-10-CM

## 2010-12-12 DIAGNOSIS — IMO0002 Reserved for concepts with insufficient information to code with codable children: Secondary | ICD-10-CM

## 2010-12-12 NOTE — Assessment & Plan Note (Signed)
Patient is for left TKR. AS part of pre-op she has an abnormal EKG.  Plan - further cardiologic testing prior to clearing for surgery.

## 2010-12-12 NOTE — Progress Notes (Signed)
  Subjective:    Patient ID: Sheila Schmidt, female    DOB: 02-May-1936, 75 y.o.   MRN: [id number]  HPI Mrs. Bhullar presents for pre-surgical clearance for TKR left. She has been doing well: she follows with her oncologist and surgeon every 6 months due to h/o breast cancer in '10. Her last mammogram Oct '11 was negative; whole body bone scan Feb '12 was negative. She has no h/o heart disease but has several risk factors: post-menopausal, hyperlipidemia on medication, mother died of CAD/MI. She has had no cardiac symptoms.   Past Medical History  Diagnosis Date  . Cancer     breast carcinoma in situ  . Depression   . Hyperlipidemia   . Hx of adenomatous colonic polyps   . Cholelithiasis   . DJD (degenerative joint disease) of knee     bilateral   Past Surgical History  Procedure Date  . Breast lumpectomy oct '10    Left  . Knee arthroscopy     Left '00 Rendall/ Right '08  . Cholecystectomy, laparoscopic '06    [name]  . Lumpectomy- remote     benign   Family History  Problem Relation Age of Onset  . Diabetes Mother   . Heart disease Mother     cad/MI-fatal  . Breast cancer Neg Hx   . Colon cancer Neg Hx   . Cancer Father     liver   History   Social History  . Marital Status: Married    Spouse Name: N/A    Number of Children: 3  . Years of Education: N/A   Occupational History  . 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 Smoking status: Not on file  . Smokeless tobacco: Not on file  . Alcohol Use:   . Drug Use:   . Sexually Active:    Other Topics Concern  . Not on file   Social History Narrative   [employer]. HSG. Married '59, 3 daughters- '61, '64, '73; 2 grandchildren.Marriage in good health. Exercise - goes to gym 5/wk.       Review of Systems Review of Systems  Constitutional:  Negative for fever, chills, activity change and unexpected weight change.  HEENT:  Negative for hearing loss, ear pain,  congestion, neck stiffness and postnasal drip. Negative for sore throat or swallowing problems. Negative for dental complaints.   Eyes: Negative for vision loss or change in visual acuity.  Respiratory: Negative for chest tightness and wheezing.   Cardiovascular: Negative for chest pain and palpitation. No decreased exercise tolerance Gastrointestinal: No change in bowel habit. No bloating or gas. No reflux or indigestion Genitourinary: Negative for urgency, frequency, flank pain and difficulty urinating.  Musculoskeletal: Negative for myalgias, back pain, arthralgias and gait problem.  Neurological: Negative for dizziness, tremors, weakness and headaches.  Hematological: Negative for adenopathy.  Psychiatric/Behavioral: Negative for behavioral problems and dysphoric mood.       Objective:   Physical Exam Vital reviewed Gen'l - WNWD overweight white woman in no distress HEENT - [place]/AT, C&S clear, PERRLA, funduscopic exam is normal Neck - supple Chest - CTAP Cor- no JVD, no carotid bruits, RRR with occasional PVCs, no murmur Abdomen - obese, soft, BS + , no guarding or rebound Ext - normal Skin - too tanned.        Assessment & Plan:

## 2010-12-12 NOTE — Assessment & Plan Note (Signed)
On treatment. Sheila Schmidt believes that Dr. Aldona Bar has checked her cholesterol in the last several months.   Plan - have requested copy of lab.

## 2010-12-28 ENCOUNTER — Ambulatory Visit (HOSPITAL_COMMUNITY): Payer: Medicare Other | Attending: Internal Medicine | Admitting: Radiology

## 2010-12-28 DIAGNOSIS — Z8249 Family history of ischemic heart disease and other diseases of the circulatory system: Secondary | ICD-10-CM | POA: Insufficient documentation

## 2010-12-28 DIAGNOSIS — Z01810 Encounter for preprocedural cardiovascular examination: Secondary | ICD-10-CM | POA: Insufficient documentation

## 2010-12-28 DIAGNOSIS — R9431 Abnormal electrocardiogram [ECG] [EKG]: Secondary | ICD-10-CM | POA: Insufficient documentation

## 2010-12-28 DIAGNOSIS — E785 Hyperlipidemia, unspecified: Secondary | ICD-10-CM | POA: Insufficient documentation

## 2010-12-28 DIAGNOSIS — E669 Obesity, unspecified: Secondary | ICD-10-CM | POA: Insufficient documentation

## 2010-12-28 DIAGNOSIS — I447 Left bundle-branch block, unspecified: Secondary | ICD-10-CM

## 2010-12-28 MED ORDER — TECHNETIUM TC 99M TETROFOSMIN IV KIT
11.0000 | PACK | Freq: Once | INTRAVENOUS | Status: AC | PRN
  Administered 2010-12-28: 11 via INTRAVENOUS

## 2010-12-28 MED ORDER — ADENOSINE (DIAGNOSTIC) 3 MG/ML IV SOLN
0.5600 mg/kg | Freq: Once | INTRAVENOUS | Status: AC
  Administered 2010-12-28 (×2): 43.5 mg via INTRAVENOUS

## 2010-12-28 MED ORDER — TECHNETIUM TC 99M TETROFOSMIN IV KIT
33.0000 | PACK | Freq: Once | INTRAVENOUS | Status: AC | PRN
  Administered 2010-12-28: 33 via INTRAVENOUS

## 2010-12-28 NOTE — Progress Notes (Signed)
[facility] SITE 3 NUCLEAR MED [address] [place] [address] [phone]  Cardiology Nuclear Med Study  Sheila Schmidt is a 75 y.o. female [id number] [birth year]   Nuclear Med Background Indication for Stress Test:  Evaluation for Ischemia, Surgical Clearance: pending (L) TKR by Dr. Priscille Kluver, and Abnormal EKG History:  No previous documented CAD Cardiac Risk Factors: Family History - CAD and Lipids  Symptoms:  Palpitations   Nuclear Pre-Procedure Caffeine/Decaff Intake:  None NPO After: 7:00pm   Lungs:  clear IV 0.9% NS with Angio Cath:  22g  IV Site: R Hand  IV Started by:  Irean Hong, RN  Chest Size (in):  36 Cup Size: C  Height: 5\' 3"  (1.6 m)  Weight:  171 lb (77.565 kg)  BMI:  Body mass index is 30.29 kg/(m^2). Tech Comments:  BP/IV (R) arm    Nuclear Med Study 1 or 2 day study: 1 day  Stress Test Type:  Adenosine  Reading MD: Dietrich Pates, MD  Order Authorizing Provider:  M.Norins  Resting Radionuclide: Technetium 47m Tetrofosmin  Resting Radionuclide Dose: 11 mCi   Stress Radionuclide:  Technetium 32m Tetrofosmin  Stress Radionuclide Dose: 33 mCi           Stress Protocol Rest HR: 69 Stress HR: 71  Rest BP: 129/84 Stress BP: 123/68  Exercise Time (min): n/a METS: n/a   Predicted Max HR: 148 bpm % Max HR: 47.97 bpm Rate Pressure Product: 8733   Dose of Adenosine (mg):  43.5 Dose of Lexiscan: n/a mg  Dose of Atropine (mg): n/a Dose of Dobutamine: n/a mcg/kg/min (at max HR)  Stress Test Technologist: Milana Na, EMT-P  Nuclear Technologist:  Domenic Polite, CNMT     Rest Procedure:  Myocardial perfusion imaging was performed at rest 45 minutes following the intravenous administration of Technetium 40m Tetrofosmin. Rest ECG: NSR-LBBB  Stress Procedure:  The patient received IV adenosine at 140 mcg/kg/min for 4 minutes.  There were no significant changes with infusion. No symptoms with infusion. Technetium 60m Tetrofosmin was  injected at the 2 minute mark and quantitative spect images were obtained after a 45 minute delay. Stress ECG: No significant change from baseline ECG  QPS Raw Data Images:  Images were motion corrected.  Soft tissue (diaphragm, breast) surround heart. Stress Images:  Mild thinning in the anterior wall (mid, distal).  Otherwise normal perfusion. Rest Images:  Mild improvement from the stress images. Subtraction (SDS):  No evidence of ischemia. Transient Ischemic Dilatation (Normal <1.22):  1.15 Lung/Heart Ratio (Normal <0.45):  .28  Quantitative Gated Spect Images QGS EDV:  109 ml QGS ESV:  41 ml QGS cine images:  NL LV Function; NL Wall Motion QGS EF: 63%  Impression Exercise Capacity:  Lexiscan with no exercise. BP Response:  Normal blood pressure response. Clinical Symptoms:  No chest pain. ECG Impression:  No significant ST segment change suggestive of ischemia. Comparison with Prior Nuclear Study: No prior study.  Overall Impression:  Probable normal perfusion and some shift soft tissue attenuation anteriorly.  There does not appear to be any significant ischemia.

## 2010-12-29 NOTE — Progress Notes (Signed)
NUCLEAR REPORT ROUTED TO DR. Debby Bud. Sheila Schmidt, Sheila Schmidt

## 2011-01-01 ENCOUNTER — Telehealth: Payer: Self-pay | Admitting: *Deleted

## 2011-01-01 NOTE — Telephone Encounter (Signed)
Patient requesting results of stress test when avail.

## 2011-01-02 NOTE — Telephone Encounter (Signed)
Study from the 19th read ot as a normal study : no evidence to suggest any blockages

## 2011-01-02 NOTE — Telephone Encounter (Signed)
Husband informed

## 2011-01-03 ENCOUNTER — Telehealth: Payer: Self-pay | Admitting: *Deleted

## 2011-01-03 NOTE — Telephone Encounter (Signed)
Patient requesting clearance for knee surgery. She had stress test last week.

## 2011-01-04 NOTE — Telephone Encounter (Signed)
Faxed to Dr Priscille Kluver, Patient informed

## 2011-01-04 NOTE — Telephone Encounter (Signed)
Myoview is OK - ok for surgery. Please send a copy of the report to her surgeon. thanks

## 2011-02-05 HISTORY — PX: TOTAL KNEE ARTHROPLASTY: SHX125

## 2011-02-18 ENCOUNTER — Emergency Department (INDEPENDENT_AMBULATORY_CARE_PROVIDER_SITE_OTHER): Payer: Medicare Other

## 2011-02-18 ENCOUNTER — Encounter (HOSPITAL_BASED_OUTPATIENT_CLINIC_OR_DEPARTMENT_OTHER): Payer: Self-pay | Admitting: Emergency Medicine

## 2011-02-18 ENCOUNTER — Emergency Department (HOSPITAL_BASED_OUTPATIENT_CLINIC_OR_DEPARTMENT_OTHER)
Admission: EM | Admit: 2011-02-18 | Discharge: 2011-02-18 | Disposition: A | Discharge status: Home / Self Care | Payer: Medicare Other | Attending: Emergency Medicine | Admitting: Emergency Medicine

## 2011-02-18 DIAGNOSIS — R1084 Generalized abdominal pain: Secondary | ICD-10-CM

## 2011-02-18 DIAGNOSIS — E785 Hyperlipidemia, unspecified: Secondary | ICD-10-CM | POA: Insufficient documentation

## 2011-02-18 DIAGNOSIS — F329 Major depressive disorder, single episode, unspecified: Secondary | ICD-10-CM | POA: Insufficient documentation

## 2011-02-18 DIAGNOSIS — N390 Urinary tract infection, site not specified: Secondary | ICD-10-CM

## 2011-02-18 DIAGNOSIS — R509 Fever, unspecified: Secondary | ICD-10-CM | POA: Insufficient documentation

## 2011-02-18 DIAGNOSIS — R109 Unspecified abdominal pain: Secondary | ICD-10-CM | POA: Insufficient documentation

## 2011-02-18 DIAGNOSIS — R739 Hyperglycemia, unspecified: Secondary | ICD-10-CM

## 2011-02-18 DIAGNOSIS — F3289 Other specified depressive episodes: Secondary | ICD-10-CM | POA: Insufficient documentation

## 2011-02-18 DIAGNOSIS — R7309 Other abnormal glucose: Secondary | ICD-10-CM | POA: Insufficient documentation

## 2011-02-18 DIAGNOSIS — R1031 Right lower quadrant pain: Secondary | ICD-10-CM

## 2011-02-18 LAB — CBC
MCH: 34.4 pg — ABNORMAL HIGH (ref 26.0–34.0)
MCHC: 35.8 g/dL (ref 30.0–36.0)
MCV: 96.2 fL (ref 78.0–100.0)
Platelets: 208 10*3/uL (ref 150–400)
RBC: 3.98 MIL/uL (ref 3.87–5.11)
RDW: 11.3 % — ABNORMAL LOW (ref 11.5–15.5)

## 2011-02-18 LAB — COMPREHENSIVE METABOLIC PANEL
ALT: 9 U/L (ref 0–35)
AST: 12 U/L (ref 0–37)
Alkaline Phosphatase: 49 U/L (ref 39–117)
CO2: 25 mEq/L (ref 19–32)
Chloride: 105 mEq/L (ref 96–112)
Creatinine, Ser: 0.8 mg/dL (ref 0.50–1.10)
GFR calc non Af Amer: >60 mL/min (ref >60)
Potassium: 3.8 mEq/L (ref 3.5–5.1)
Total Bilirubin: 0.6 mg/dL (ref 0.3–1.2)

## 2011-02-18 LAB — URINALYSIS, ROUTINE W REFLEX MICROSCOPIC
Nitrite: NEGATIVE
Specific Gravity, Urine: 1.026 (ref 1.005–1.030)
Urobilinogen, UA: 1 mg/dL (ref 0.0–1.0)
pH: 6 (ref 5.0–8.0)

## 2011-02-18 LAB — DIFFERENTIAL
Basophils Absolute: 0 10*3/uL (ref 0.0–0.1)
Basophils Relative: 0 % (ref 0–1)
Eosinophils Absolute: 0.1 10*3/uL (ref 0.0–0.7)
Eosinophils Relative: 1 % (ref 0–5)
Neutrophils Relative %: 82 % — ABNORMAL HIGH (ref 43–77)

## 2011-02-18 LAB — URINE MICROSCOPIC-ADD ON

## 2011-02-18 MED ORDER — CEFUROXIME AXETIL 500 MG PO TABS: 500.0000 mg | ORAL_TABLET | Freq: Two times a day (BID) | ORAL | Status: AC

## 2011-02-18 NOTE — ED Provider Notes (Signed)
History     CSN: [id number] Arrival date & time: 02/18/2011  9:51 AM  Chief Complaint  Patient presents with  . Abdominal Pain  . Fever   HPI complains of diffuse abdominal pain onset 4 days ago crampy waxes and wanes resolved since yesterday. Patient presently asymptomatic. Had one episode of diarrhea 4 days ago and subjective fever. She did not take her temperature treated herself with an over-the-counter pain reliever 4 days ago no treatment since. No vomiting no anorexia no other complaint comes to get "checked out" and she is scheduled for elective knee surgery several days now. Nothing makes symptoms better or worse however she is presently asymptomatic . She ate a BLT sandwich morning HPI  Past Medical History  Diagnosis Date  . Cancer     breast carcinoma in situ  . Depression   . Hyperlipidemia   . Hx of adenomatous colonic polyps   . Cholelithiasis   . DJD (degenerative joint disease) of knee     bilateral    Past Surgical History  Procedure Date  . Breast lumpectomy oct '10    Left  . Knee arthroscopy     Left '00 Rendall/ Right '08  . Cholecystectomy, laparoscopic '06    [name]  . Lumpectomy- remote     benign    Family History  Problem Relation Age of Onset  . Diabetes Mother   . Heart disease Mother     cad/MI-fatal  . Breast cancer Neg Hx   . Colon cancer Neg Hx   . Cancer Father     liver    History  Substance Use Topics  . Smoking status: Never Smoker   . Smokeless tobacco: Not on file  . Alcohol Use: 4.2 oz/week    7 Glasses of wine per week     daily    OB History    Grav Para Term Preterm Abortions TAB SAB Ect Mult Living   3 3              Review of Systems  Constitutional: Positive for fever.  HENT: Negative.   Respiratory: Negative.   Cardiovascular: Negative.   Gastrointestinal: Positive for abdominal pain and diarrhea.  Musculoskeletal: Negative.   Skin: Negative.   Neurological: Negative.   Hematological: Negative.     Psychiatric/Behavioral: Negative.     Physical Exam  BP 150/84  Pulse 91  Temp 98.1 F (36.7 C)  Resp 22  Ht 5\' 4"  (1.626 m)  SpO2 97%  Physical Exam  Nursing note and vitals reviewed. Constitutional: She appears well-developed and well-nourished.  HENT:  Head: Normocephalic and atraumatic.  Eyes: Conjunctivae are normal. Pupils are equal, round, and reactive to light.  Neck: Neck supple. No tracheal deviation present. No thyromegaly present.  Cardiovascular: Normal rate and regular rhythm.   No murmur heard. Pulmonary/Chest: Effort normal and breath sounds normal.  Abdominal: Soft. Bowel sounds are normal. She exhibits no distension.       Normal active bowel sounds mild diffuse tenderness  Musculoskeletal: Normal range of motion. She exhibits no edema and no tenderness.  Neurological: She is alert. Coordination normal.  Skin: Skin is warm and dry. No rash noted.  Psychiatric: She has a normal mood and affect.    ED Course  Procedures At 11:43 AM patient resting comfortably offers no complaint MDM Urine consistent with urinary tract infection. Hyperglycemia noted. Plan prescription Ceftin . Patient instructed to call Dr. Debby Bud to be seen in the office this week  urine sent for culture      Doug Sou, MD 02/18/11 1148

## 2011-02-18 NOTE — ED Notes (Signed)
Pt having lower abdominal pain x 1 week.  No N/V.  Some diarrhea.  Low grade fever for a few days.  No dysuria.  Pain initiated in LUQ and has radiated to pelvic area.  No vaginal discharge.

## 2011-02-20 LAB — URINE CULTURE: Colony Count: >=100000

## 2011-03-01 ENCOUNTER — Other Ambulatory Visit (HOSPITAL_COMMUNITY): Payer: Self-pay | Admitting: Orthopedic Surgery

## 2011-03-01 ENCOUNTER — Other Ambulatory Visit: Payer: Self-pay | Admitting: Orthopedic Surgery

## 2011-03-01 ENCOUNTER — Encounter (HOSPITAL_COMMUNITY): Payer: Medicare Other

## 2011-03-01 ENCOUNTER — Ambulatory Visit (HOSPITAL_COMMUNITY)
Admission: RE | Admit: 2011-03-01 | Discharge: 2011-03-01 | Disposition: A | Discharge status: Home / Self Care | Payer: Medicare Other | Source: Ambulatory Visit | Attending: Orthopedic Surgery | Admitting: Orthopedic Surgery

## 2011-03-01 DIAGNOSIS — Z9089 Acquired absence of other organs: Secondary | ICD-10-CM | POA: Insufficient documentation

## 2011-03-01 DIAGNOSIS — Z01818 Encounter for other preprocedural examination: Secondary | ICD-10-CM | POA: Insufficient documentation

## 2011-03-01 DIAGNOSIS — Z01811 Encounter for preprocedural respiratory examination: Secondary | ICD-10-CM

## 2011-03-01 DIAGNOSIS — M1710 Unilateral primary osteoarthritis, unspecified knee: Secondary | ICD-10-CM | POA: Insufficient documentation

## 2011-03-01 DIAGNOSIS — Z01812 Encounter for preprocedural laboratory examination: Secondary | ICD-10-CM | POA: Insufficient documentation

## 2011-03-01 LAB — PROTIME-INR
INR: 1.03 (ref 0.00–1.49)
Prothrombin Time: 13.7 seconds (ref 11.6–15.2)

## 2011-03-01 LAB — DIFFERENTIAL
Basophils Absolute: 0.1 10*3/uL (ref 0.0–0.1)
Basophils Relative: 1 % (ref 0–1)
Lymphocytes Relative: 28 % (ref 12–46)
Monocytes Absolute: 0.6 10*3/uL (ref 0.1–1.0)
Neutro Abs: 4.8 10*3/uL (ref 1.7–7.7)

## 2011-03-01 LAB — CBC
HCT: 40.5 % (ref 36.0–46.0)
Hemoglobin: 13.9 g/dL (ref 12.0–15.0)
MCHC: 34.3 g/dL (ref 30.0–36.0)
RDW: 11.6 % (ref 11.5–15.5)
WBC: 7.7 10*3/uL (ref 4.0–10.5)

## 2011-03-01 LAB — COMPREHENSIVE METABOLIC PANEL
ALT: 11 U/L (ref 0–35)
Albumin: 3.5 g/dL (ref 3.5–5.2)
Alkaline Phosphatase: 45 U/L (ref 39–117)
BUN: 10 mg/dL (ref 6–23)
Chloride: 104 mEq/L (ref 96–112)
Glucose, Bld: 94 mg/dL (ref 70–99)
Potassium: 3.8 mEq/L (ref 3.5–5.1)
Sodium: 138 mEq/L (ref 135–145)
Total Bilirubin: 0.4 mg/dL (ref 0.3–1.2)
Total Protein: 7.1 g/dL (ref 6.0–8.3)

## 2011-03-01 LAB — URINALYSIS, ROUTINE W REFLEX MICROSCOPIC
Bilirubin Urine: NEGATIVE
Glucose, UA: NEGATIVE mg/dL
Ketones, ur: NEGATIVE mg/dL
Protein, ur: NEGATIVE mg/dL
pH: 6.5 (ref 5.0–8.0)

## 2011-03-01 LAB — APTT: aPTT: 29 seconds (ref 24–37)

## 2011-03-01 LAB — ABO/RH: ABO/RH(D): O POS

## 2011-03-01 LAB — SURGICAL PCR SCREEN: MRSA, PCR: NEGATIVE

## 2011-03-08 ENCOUNTER — Inpatient Hospital Stay (HOSPITAL_COMMUNITY)
Admission: RE | Admit: 2011-03-08 | Discharge: 2011-03-11 | DRG: 470 | Disposition: A | Discharge status: Home Health | Payer: Medicare Other | Source: Ambulatory Visit | Attending: Orthopedic Surgery | Admitting: Orthopedic Surgery

## 2011-03-08 DIAGNOSIS — Z01812 Encounter for preprocedural laboratory examination: Secondary | ICD-10-CM

## 2011-03-08 DIAGNOSIS — E785 Hyperlipidemia, unspecified: Secondary | ICD-10-CM | POA: Diagnosis present

## 2011-03-08 DIAGNOSIS — M1710 Unilateral primary osteoarthritis, unspecified knee: Principal | ICD-10-CM | POA: Diagnosis present

## 2011-03-08 DIAGNOSIS — M21169 Varus deformity, not elsewhere classified, unspecified knee: Secondary | ICD-10-CM | POA: Diagnosis present

## 2011-03-08 DIAGNOSIS — R7309 Other abnormal glucose: Secondary | ICD-10-CM | POA: Diagnosis present

## 2011-03-08 DIAGNOSIS — Z791 Long term (current) use of non-steroidal anti-inflammatories (NSAID): Secondary | ICD-10-CM

## 2011-03-08 DIAGNOSIS — I259 Chronic ischemic heart disease, unspecified: Secondary | ICD-10-CM | POA: Diagnosis present

## 2011-03-08 DIAGNOSIS — I10 Essential (primary) hypertension: Secondary | ICD-10-CM | POA: Diagnosis present

## 2011-03-08 DIAGNOSIS — D62 Acute posthemorrhagic anemia: Secondary | ICD-10-CM | POA: Diagnosis not present

## 2011-03-08 DIAGNOSIS — Z79899 Other long term (current) drug therapy: Secondary | ICD-10-CM

## 2011-03-08 DIAGNOSIS — C50919 Malignant neoplasm of unspecified site of unspecified female breast: Secondary | ICD-10-CM | POA: Diagnosis present

## 2011-03-08 LAB — TYPE AND SCREEN

## 2011-03-09 LAB — BASIC METABOLIC PANEL
CO2: 29 mEq/L (ref 19–32)
Calcium: 8 mg/dL — ABNORMAL LOW (ref 8.4–10.5)
Creatinine, Ser: 0.54 mg/dL (ref 0.50–1.10)
GFR calc non Af Amer: >60 mL/min (ref >60)
Sodium: 134 mEq/L — ABNORMAL LOW (ref 135–145)

## 2011-03-09 LAB — CBC
MCH: 33.3 pg (ref 26.0–34.0)
MCHC: 33.3 g/dL (ref 30.0–36.0)
MCV: 100 fL (ref 78.0–100.0)
Platelets: 178 10*3/uL (ref 150–400)
RBC: 2.85 MIL/uL — ABNORMAL LOW (ref 3.87–5.11)
RDW: 12.2 % (ref 11.5–15.5)

## 2011-03-10 LAB — CBC
MCV: 100.4 fL — ABNORMAL HIGH (ref 78.0–100.0)
Platelets: 161 10*3/uL (ref 150–400)
RDW: 12.2 % (ref 11.5–15.5)
WBC: 9.6 10*3/uL (ref 4.0–10.5)

## 2011-03-10 LAB — BASIC METABOLIC PANEL
Calcium: 8.6 mg/dL (ref 8.4–10.5)
Chloride: 107 mEq/L (ref 96–112)
Creatinine, Ser: 0.48 mg/dL — ABNORMAL LOW (ref 0.50–1.10)
GFR calc Af Amer: >60 mL/min (ref >60)
Sodium: 139 mEq/L (ref 135–145)

## 2011-03-11 LAB — CBC
HCT: 26.6 % — ABNORMAL LOW (ref 36.0–46.0)
Platelets: 161 10*3/uL (ref 150–400)
RBC: 2.65 MIL/uL — ABNORMAL LOW (ref 3.87–5.11)
RDW: 12.2 % (ref 11.5–15.5)
WBC: 9.2 10*3/uL (ref 4.0–10.5)

## 2011-03-14 NOTE — Op Note (Signed)
NAME:  Sheila Schmidt, Sheila Schmidt NO.:  [id number]  MEDICAL RECORD NO.:  [id number]  LOCATION:  1610                         FACILITY:  [facility]  PHYSICIAN:  Kier Smead L. Rendall, M.D.  DATE OF BIRTH:  07-24-1935  DATE OF PROCEDURE:  03/08/2011 DATE OF DISCHARGE:                              OPERATIVE REPORT   PREOPERATIVE DIAGNOSIS:  Osteoarthritis, left knee.  SURGICAL PROCEDURES:  Left total knee arthroplasty with computer navigation assistance.  POSTOPERATIVE DIAGNOSIS:  Osteoarthritis, left knee.  SURGEON:  Avyay Coger L. Rendall, MD  ASSISTANT:  Legrand Pitts. Duffy, P.A. - present and participating in entire procedure.  ANESTHESIA:  General with femoral nerve block.  PATHOLOGY:  The patient is bone against bone medially left knee.  She has failed all conservative measures.  There are spurs all about the distal femur.  She is also a breast cancer survivor and wants to optimize her time.  PROCEDURE:  Under general anesthesia, the left leg was prepared with DuraPrep and draped as a sterile field.  Sterile tourniquet was applied proximally.  Legs wrapped out with the Esmarch and the tourniquet was used to 350 mm.  Standard time-out was observed.  Antibiotics were given before the tourniquet.  At this point, a midline incision was made.  The patella was everted.  The knee was debrided in preparation for computer navigation.  Schanz pins were placed to the superomedial tibia and distal femur.  The comparison of the femoral size with trial suggests a standard femur.  Using the arrays on the distal femur and proximal tibia, computer mapping is completed, then using the first guide, the proximal tibial resection is carried out within 1 degree of anatomic alignment.  It should be noted that the leg was in 8.4 degrees varus and 4 degrees of fixed flexion prior to start of the procedure.  At this point, the collateral ligaments were balanced using the balancer. Attention was then turned  to the femur.  The anterior and posterior flare of the distal femur were resected within 1 degree of anatomic rotation.  Distal femoral cut was then made within 1 degree of anatomic alignment.  Flexion and extension gaps were balanced at 10 mm.  Lamina spreader was inserted.  Remnants of the menisci and cruciates were resected.  Spurs were taken off the back of the femoral condyles.  The recessing guide is then used.  With the femur prepared, attention was turned to the tibia.  A Mchale was placed posteriorly, Hohmann laterally.  It is sized to 2-1/2, a center peg hole with keel were inserted.  The trial was then done with a 10 mm bearing and a standard femur.  Excellent fit, alignment and stability were encountered with excellent stability and alignment within 1 degree of anatomic.  At this point, the patella was osteotomized through peg hole.  Patella was trialed and with excellent fit, alignment and stability, the arrays were taken down.  Permanent components obtained.  Cement was mixed while bony surfaces were prepared with pulse irrigation.  Cement, once it was hardened, the tourniquet was let down after 1 hour and 1 minute.  All excess cement was removed.  Multiple small vessels were cauterized.  A synovectomy was done while cement was hardening.  The knee was then closed in layers using #1 Tycron, 0 Vicryl, 2-0 Vicryl, and skin clips.  Component sizes, standard LCS femur 10 mm rotating bearing, 2-1/2 tibial tray, and standard patella.  The patient tolerated the procedure well and returned to recovery in excellent condition with a medium Hemovac drain.     Suleman Gunning L. Priscille Kluver, M.D.     Renato Gails  D:  03/08/2011  T:  03/08/2011  Job:  [id number]  Electronically Signed by Erasmo Leventhal M.D. on 03/14/2011 02:05:41 PM

## 2011-03-27 ENCOUNTER — Other Ambulatory Visit: Payer: MEDICARE

## 2011-03-30 NOTE — Discharge Summary (Signed)
NAME:  Sheila Schmidt, Sheila Schmidt NO.:  [id number]  MEDICAL RECORD NO.:  [id number]  LOCATION:  1610                         FACILITY:  [facility]  PHYSICIAN:  December Hedtke L. Rendall, M.D.  DATE OF BIRTH:  [birth year]  DATE OF ADMISSION:  03/08/2011 DATE OF DISCHARGE:  03/11/2011                              DISCHARGE SUMMARY   ADMISSION DIAGNOSES: 1. End-stage osteoarthritis, left knee. 2. Ischemic heart disease. 3. Hyperlipidemia. 4. History of breast cancer.  DISCHARGE DIAGNOSES: 1. End-stage osteoarthritis, left knee; status post left total knee     arthroplasty. 2. Acute blood loss anemia secondary to surgery. 3. Ischemic heart disease. 4. Hyperlipidemia. 5. History of breast cancer.  SURGICAL PROCEDURE:  On March 08, 2011, Sheila Schmidt underwent a left total knee arthroplasty with computer navigation by Dr. Jonny Ruiz L. Rendall assisted by Legrand Pitts. Duffy, P.A.C.  COMPLICATIONS:  None.  During the procedure she had and an LCS primary femoral component, cemented size standard left, placed with an LCS complete tibial insert rotating platform 10 mm thickness standard.  A tibial tray rotating platform MBT keel size 2.5 cemented and LCS complete metal-backed patella cemented size standard.  CONSULTS: 1. Physical therapy consult, March 09, 2011. 2. Occupational therapy consult, March 10, 2011. 3. Case management consult, March 11, 2011.  HISTORY OF PRESENT ILLNESS:  This 75 year old white female patient presented with history of left knee pain since 2000.  She has failed conservative care with NSAIDS, steroid injections, PT, and rest; and the pain has increased enough that is interfering with her ADL, sleep, and is preventing good ambulation and safe transfers.  X-rays show bone-on- bone in the medial compartment of the knee with varus deformity and because of this, she wishes to proceed with a left knee replacement.  HOSPITAL COURSE:  Sheila Schmidt tolerated  her surgical procedure well without immediate postoperative complications.  She was transferred to the orthopedic floor.  She did have some difficulty with drainage from the knee initially, but that is low.  She was afebrile.  Vitals stable on postop day #1 with hemoglobin 9.5, hematocrit 28.5.  She was tolerating the CPM.  She was weaned off the oxygen and started on therapy per protocol.  Postop day #2, T-max 73 99.5, BP a little low at 98/63, hemoglobin 9.3, hematocrit 28.1.  She tolerated CPM and continued on therapy.  She had some difficulty with low O2 with ambulation and that was monitored.  On March 11, 2011, she was tolerating her diet and voiding without difficulty.  Hemoglobin 8.7, hematocrit 26.6.  She was felt to be ready for discharge to home, was discharged home later that day.  DISCHARGE INSTRUCTIONS:  Diet:  She is to resume her regular prehospitalization diet.  Medications:  Please see the patient med discharge instruction sheet for complete documentation of her meds, but we did place her on new meds: Celebrex, Robaxin, Percocet, and Xarelto, and we had her hold her ibuprofen.  Activity:  She can be out of ,bed weightbearing as tolerated on the left leg with use of a walker.  No lifting or driving for 6 weeks.  Please see the white total joint discharge sheet for further activity instructions.  Wound care:  Please see the white total joint discharge sheet for further wound care instructions.  Follow up:  She is to follow up with Dr. Priscille Kluver in our office on Tuesday, March 20, 2011, and needs to call [phone] for that appointment.  LABORATORY DATA:  Hemoglobin and hematocrit ranged from 9.5 and 28.5 on March 09, 2011; 8.7 and 26.6 on March 11, 2011.  Sodium went from 134 on March 09, 2011, to 139 on March 10, 2011.  Glucose went from 104 on March 09, 2011, to 109 on September 29; BUN and creatinine were 9 and 0.54 on March 09, 2011,  and 8 and 0.48 on March 10, 2011.  Calcium was low at 8 on March 09, 2011, and then was back within normal limits.  All other laboratory studies were within normal limits.     Legrand Pitts Duffy, P.A.   ______________________________ [name], M.D.    KED/MEDQ  D:  03/22/2011  T:  03/22/2011  Job:  [id number]  Electronically Signed by Otilio Jefferson. on 03/27/2011 08:03:13 AM Electronically Signed by Erasmo Leventhal M.D. on 03/30/2011 08:21:44 AM

## 2011-04-16 ENCOUNTER — Other Ambulatory Visit: Payer: Self-pay | Admitting: Oncology

## 2011-04-16 ENCOUNTER — Other Ambulatory Visit (HOSPITAL_BASED_OUTPATIENT_CLINIC_OR_DEPARTMENT_OTHER): Payer: Medicare Other | Admitting: Lab

## 2011-04-16 DIAGNOSIS — C50419 Malignant neoplasm of upper-outer quadrant of unspecified female breast: Secondary | ICD-10-CM

## 2011-04-16 LAB — COMPREHENSIVE METABOLIC PANEL
Albumin: 4 g/dL (ref 3.5–5.2)
BUN: 16 mg/dL (ref 6–23)
CO2: 25 mEq/L (ref 19–32)
Calcium: 9.8 mg/dL (ref 8.4–10.5)
Chloride: 107 mEq/L (ref 96–112)
Glucose, Bld: 90 mg/dL (ref 70–99)
Potassium: 4.2 mEq/L (ref 3.5–5.3)

## 2011-04-16 LAB — CBC WITH DIFFERENTIAL/PLATELET
Basophils Absolute: 0 10*3/uL (ref 0.0–0.1)
Eosinophils Absolute: 0.1 10*3/uL (ref 0.0–0.5)
HGB: 12.6 g/dL (ref 11.6–15.9)
NEUT#: 4.8 10*3/uL (ref 1.5–6.5)
RDW: 14 % (ref 11.2–14.5)
lymph#: 1.7 10*3/uL (ref 0.9–3.3)

## 2011-04-16 LAB — CANCER ANTIGEN 27.29: CA 27.29: 8 U/mL (ref 0–39)

## 2011-04-23 ENCOUNTER — Ambulatory Visit (HOSPITAL_BASED_OUTPATIENT_CLINIC_OR_DEPARTMENT_OTHER): Payer: Medicare Other | Admitting: Oncology

## 2011-04-23 ENCOUNTER — Telehealth: Payer: Self-pay | Admitting: *Deleted

## 2011-04-23 VITALS — BP 160/93 | HR 71 | Temp 97.9°F | Ht 62.5 in | Wt 166.1 lb

## 2011-04-23 DIAGNOSIS — Z23 Encounter for immunization: Secondary | ICD-10-CM

## 2011-04-23 DIAGNOSIS — Z171 Estrogen receptor negative status [ER-]: Secondary | ICD-10-CM

## 2011-04-23 DIAGNOSIS — C50419 Malignant neoplasm of upper-outer quadrant of unspecified female breast: Secondary | ICD-10-CM

## 2011-04-23 NOTE — Progress Notes (Signed)
ID: Phoebe Perch Boulden   Interval History: Lyliana returns today for followup of her breast cancer in the interval she has had a left total knee replacement under John Rendall. . She didn't feel it warranted it occasionally she has bilateral ankle swelling she knows to use a water  The review of systems is very stable and she has minimal problems with hot flashes and she never started the gabapentin at bedtime She was not aware that she needed to take antibiotic prophylaxis before dental procedures and so her teeth cleaning had to be canceled. She also ended up responding her mammography which actually will be done tomorrow so we do not yet have those results. Aside from all this she is generally doing well and she is planning to spend the Thanksgiving holidays with her daughter in [place] There has been no bleeding and no fever She does have some pain and she is using mostly Aleve for that She is getting rehabilitation 3 times a week and a ready feels she is doing better than before, not using a walker anymore, and really not even needing a walking cane.  ROS: The review of systems is generally stable and aside from on file hot flashes in the postoperative issues it is noncontributory. She does have bilateral ankle swelling at times and sometimes she takes a "water pill" for this. Otherwise having no unusual headaches visual changes cough phlegm production pleurisy shortness of breath change in bowel or bladder habits bleeding fever or rash. Has been no unusual pain and no unexplained weight loss or fatigue. A detailed review of systems was otherwise noncontributory  Medications: I have reviewed the patient's current medications.   Current Outpatient Prescriptions  Medication Sig Dispense Refill  . Diphenhydramine-APAP 25-500 MG TABS Take 1 capsule by mouth at bedtime as needed.        Marland Kitchen FLUoxetine (PROZAC) 10 MG capsule Take 10 mg by mouth daily.        Marland Kitchen ibuprofen (ADVIL,MOTRIN) 800 MG tablet Take 800  mg by mouth every 8 (eight) hours as needed.        . simvastatin (ZOCOR) 5 MG tablet Take 20 mg by mouth at bedtime.       . TAMOXIFEN CITRATE PO Take 20 mg by mouth daily.       . traMADol (ULTRAM) 50 MG tablet Take 50-100 mg by mouth 4 (four) times daily as needed.        . triamterene-hydrochlorothiazide (MAXZIDE-25) 37.5-25 MG per tablet Take 1 tablet by mouth every morning.          Objective: Vital signs in last 24 hours: BP 160/93  Pulse 71  Temp(Src) 97.9 F (36.6 C) (Oral)  Ht 5' 2.5" (1.588 m)  Wt 166 lb 1.6 oz (75.342 kg)  BMI 29.90 kg/m2   Physical Exam:    Sclerae unicteric  Oropharynx clear  No peripheral adenopathy  Lungs clear -- no rales or rhonchi  Heart regular rate and rhythm  Abdomen benign  MSK no focal spinal tenderness, no peripheral edema  Neuro nonfocal  Breast exam: Right breast no suspicious findings left breast status post lumpectomy no evidence of local recurrence  Lab Results:   BMET    Component Value Date/Time   NA 139 04/16/2011 1210   NA 139 04/16/2011 1210   K 4.2 04/16/2011 1210   K 4.2 04/16/2011 1210   CL 107 04/16/2011 1210   CL 107 04/16/2011 1210   CO2 25 04/16/2011 1210  CO2 25 04/16/2011 1210   GLUCOSE 90 04/16/2011 1210   GLUCOSE 90 04/16/2011 1210   BUN 16 04/16/2011 1210   BUN 16 04/16/2011 1210   CREATININE 0.57 04/16/2011 1210   CREATININE 0.57 04/16/2011 1210   CALCIUM 9.8 04/16/2011 1210   CALCIUM 9.8 04/16/2011 1210   GFRNONAA >60 03/10/2011 0500   GFRAA >60 03/10/2011 0500     CMP     Component Value Date/Time   NA 139 04/16/2011 1210   NA 139 04/16/2011 1210   K 4.2 04/16/2011 1210   K 4.2 04/16/2011 1210   CL 107 04/16/2011 1210   CL 107 04/16/2011 1210   CO2 25 04/16/2011 1210   CO2 25 04/16/2011 1210   GLUCOSE 90 04/16/2011 1210   GLUCOSE 90 04/16/2011 1210   BUN 16 04/16/2011 1210   BUN 16 04/16/2011 1210   CREATININE 0.57 04/16/2011 1210   CREATININE 0.57 04/16/2011 1210   CALCIUM 9.8 04/16/2011 1210   CALCIUM 9.8  04/16/2011 1210   PROT 6.3 04/16/2011 1210   PROT 6.3 04/16/2011 1210   ALBUMIN 4.0 04/16/2011 1210   ALBUMIN 4.0 04/16/2011 1210   AST 12 04/16/2011 1210   AST 12 04/16/2011 1210   ALT 8 04/16/2011 1210   ALT 8 04/16/2011 1210   ALKPHOS 47 04/16/2011 1210   ALKPHOS 47 04/16/2011 1210   BILITOT 0.8 04/16/2011 1210   BILITOT 0.8 04/16/2011 1210   GFRNONAA >60 03/10/2011 0500   GFRAA >60 03/10/2011 0500    CBC    Component Value Date/Time   WBC 9.2 03/11/2011 0531   RBC 2.65* 03/11/2011 0531   HGB 12.6 04/16/2011 1210   HGB 8.7* 03/11/2011 0531   HCT 37.5 04/16/2011 1210   HCT 26.6* 03/11/2011 0531   PLT 206 04/16/2011 1210   PLT 161 03/11/2011 0531   MCV 96.0 04/16/2011 1210   MCV 100.4* 03/11/2011 0531   MCH 32.8 03/11/2011 0531   MCHC 32.7 03/11/2011 0531   RDW 12.2 03/11/2011 0531   LYMPHSABS 2.1 03/01/2011 1145   MONOABS 0.6 03/01/2011 1145   EOSABS 0.1 04/16/2011 1210   EOSABS 0.1 03/01/2011 1145   BASOSABS 0.0 04/16/2011 1210   BASOSABS 0.1 03/01/2011 1145        Studies/Results: No results found.  Assessment: 75 year old [place] woman status post left lumpectomy and sentinel lymph node sampling October of 2010 for a T1BN0 grade 1 invasive ductal carcinoma which was 100% estrogen and progesterone receptor positive, HER-2/neu negative with an MIB-1-1 of 11%. She decided against radiation and has been on tamoxifen since October of 2010 with good tolerance   Plan: We spent the better part of her visit today discussing whether or not to switch to an aromatase inhibitor and she understands the possible side effects toxicities and complications of these medications in addition to the possible benefits after full discussion we decided what we would do is stop tamoxifen December 1 and start letrozole 2.5 mg on January 1 she will call us with any problems and particularly with any side effects otherwise the plan is for her to return in 6 months and continue anti-estrogens to total 5 years at which  will be October of 2015  I should note she received her flu shot today.  Barbie Croston C 04/23/2011    2

## 2011-04-23 NOTE — Telephone Encounter (Signed)
GAVE PATIENT APPOINTMENT FOR MAMMOGRAM AND BONE DENSITY ON 04-24-2011 STARTING AT10:00AM. GAVE PATIENT APPOINTMENT FOR LAB 11-2011 AND THE WEEK AFTER SEE DR.MAGRINAT [phone]

## 2011-04-24 ENCOUNTER — Ambulatory Visit
Admission: RE | Admit: 2011-04-24 | Discharge: 2011-04-24 | Disposition: A | Discharge status: Home / Self Care | Payer: Medicare Other | Source: Ambulatory Visit | Attending: Oncology | Admitting: Oncology

## 2011-04-24 DIAGNOSIS — C50919 Malignant neoplasm of unspecified site of unspecified female breast: Secondary | ICD-10-CM

## 2011-04-24 DIAGNOSIS — Z9889 Other specified postprocedural states: Secondary | ICD-10-CM

## 2011-04-27 ENCOUNTER — Encounter: Payer: Self-pay | Admitting: *Deleted

## 2011-11-06 ENCOUNTER — Ambulatory Visit (INDEPENDENT_AMBULATORY_CARE_PROVIDER_SITE_OTHER): Payer: Self-pay | Admitting: Surgery

## 2011-11-13 ENCOUNTER — Other Ambulatory Visit: Payer: Self-pay | Admitting: *Deleted

## 2011-11-13 DIAGNOSIS — D0590 Unspecified type of carcinoma in situ of unspecified breast: Secondary | ICD-10-CM

## 2011-11-14 ENCOUNTER — Encounter (INDEPENDENT_AMBULATORY_CARE_PROVIDER_SITE_OTHER): Payer: Self-pay | Admitting: Surgery

## 2011-11-14 ENCOUNTER — Other Ambulatory Visit (HOSPITAL_BASED_OUTPATIENT_CLINIC_OR_DEPARTMENT_OTHER): Payer: Medicare Other | Admitting: Lab

## 2011-11-14 ENCOUNTER — Ambulatory Visit (INDEPENDENT_AMBULATORY_CARE_PROVIDER_SITE_OTHER): Payer: Medicare Other | Admitting: Surgery

## 2011-11-14 VITALS — BP 136/88 | HR 68 | Temp 98.2°F | Resp 18 | Ht 63.0 in | Wt 166.4 lb

## 2011-11-14 DIAGNOSIS — D0590 Unspecified type of carcinoma in situ of unspecified breast: Secondary | ICD-10-CM

## 2011-11-14 DIAGNOSIS — Z853 Personal history of malignant neoplasm of breast: Secondary | ICD-10-CM

## 2011-11-14 LAB — COMPREHENSIVE METABOLIC PANEL
Alkaline Phosphatase: 55 U/L (ref 39–117)
BUN: 19 mg/dL (ref 6–23)
Creatinine, Ser: 0.9 mg/dL (ref 0.50–1.10)
Glucose, Bld: 90 mg/dL (ref 70–99)
Sodium: 141 mEq/L (ref 135–145)
Total Bilirubin: 0.9 mg/dL (ref 0.3–1.2)

## 2011-11-14 LAB — CBC WITH DIFFERENTIAL/PLATELET
Basophils Absolute: 0.1 10*3/uL (ref 0.0–0.1)
EOS%: 1 % (ref 0.0–7.0)
HGB: 13.1 g/dL (ref 11.6–15.9)
LYMPH%: 20.4 % (ref 14.0–49.7)
MCH: 33.8 pg (ref 25.1–34.0)
MCV: 99 fL (ref 79.5–101.0)
MONO%: 7.8 % (ref 0.0–14.0)
NEUT%: 70 % (ref 38.4–76.8)
Platelets: 213 10*3/uL (ref 145–400)
RDW: 14 % (ref 11.2–14.5)

## 2011-11-14 NOTE — Patient Instructions (Signed)
Return 1 year. 

## 2011-11-14 NOTE — Progress Notes (Signed)
NAME: MASAKO OVERALL Bazen       DOB: 23-Aug-1935           DATE: 11/14/2011       MRN: [id number]   Kalley Nicholl Newcom is a 76 y.o.Marland Kitchenfemale who presents for routine followup of her Stage 1 left breast cancerdiagnosed in 10/10 /10 and treated with breast conservation . She has no problems or concerns on either side.  PFSH: She has had no significant changes since the last visit here.  ROS: There have been no significant changes since the last visit here  EXAM:  VS: BP 136/88  Pulse 68  Temp(Src) 98.2 F (36.8 C) (Temporal)  Resp 18  Ht 5\' 3"  (1.6 m)  Wt 166 lb 6.4 oz (75.479 kg)  BMI 29.48 kg/m2   General: The patient is alert, oriented, generally healty appearing, NAD. Mood and affect are normal.  Breasts:  Left breast with post surgical changes. No mass. Right breast normal.  No mass  Lymphatics: She has no axillary or supraclavicular adenopathy on either side.  Extremities: Full ROM of the surgical side with no lymphedema noted.  Data Reviewed: mammogram 11/12  birads 2  Impression: Doing well, with no evidence of recurrent cancer or new cancer  Plan: Will continue to follow up on an annual basis here.

## 2011-11-21 ENCOUNTER — Ambulatory Visit (HOSPITAL_BASED_OUTPATIENT_CLINIC_OR_DEPARTMENT_OTHER): Payer: Medicare Other | Admitting: Oncology

## 2011-11-21 VITALS — BP 157/91 | HR 66 | Temp 98.0°F | Ht 63.0 in | Wt 165.1 lb

## 2011-11-21 DIAGNOSIS — D0590 Unspecified type of carcinoma in situ of unspecified breast: Secondary | ICD-10-CM

## 2011-11-21 NOTE — Progress Notes (Signed)
ID: Numa Heatwole Dashner   DOB: [birth year]  MR#: [id number]  WUJ#:[id number]  HISTORY OF PRESENT ILLNESS: Sheila Schmidt had a screening mammogram at Dr. Mechele Collin office at [facility] 02/11/2009, which showed a possible mass in the left breast.  She was referred to [facility] for diagnostic studies and on 02/22/2009, Dr. Chilton Si confirmed the presence of a spiculated mass in the left breast at the 9 o'clock location, which was not palpable by physical exam.  Ultrasound showed a spiculated hypoechoic shadowing mass in the left breast measuring 6 mm corresponding to the screening abnormality.  Biopsy was recommended and performed the same day.  This showed ([id number] and [id number]), a low-grade ductal carcinoma which was ER and PR positive, both at 100%, with a low MIB-1 at 11%.  The tumor did not over-express HER-2 by CISH, with a ratio of 1.27.    The patient was referred to Dr. Luisa Hart and on 03/02/2009 bilateral breast MRIs were obtained.  This showed no additional abnormality.  The solitary irregular mass in the left breast measured 9 mm by MRI.  There was no evidence of abnormal adenopathy, and no findings in the right breast.  There was a small intramammary lymph node in the middle third of the outer left breast corresponding to a stable intramammary lymph node dating back all the way to 2007.  With this information, the patient proceeded to definitive left lumpectomy and sentinel node sampling 03/22/2009.  The final pathology there ([id number]) confirmed a 7-mm invasive ductal carcinoma, grade 1, with negative and ample margins.  There was no lymphovascular invasion; 0 of 2 sentinel lymph nodes were involved. Her subsequent history is as detailed below.  INTERVAL HISTORY: Sheila Schmidt returns today for followup of her breast cancer. The interval history is generally unremarkable. She switched from tamoxifen to letrozole in January of this year. So far she is tolerating it quite well.  REVIEW OF SYSTEMS: In  particular she has had no worsening problems with hot flashes. She does have vaginal dryness issues and she tells me she is getting a lubricant through Dr. Mechele Collin office. Gene was at the gym a week ago, on the treadmill, and she developed some back pain after words. There was no fall, she was not carrying anything, and she was not playing golf for doing a twisting motions at that time. The pain was bad enough that it took her to and after care clinic, where she tells me that they did do films of her lower back and hips and told her "everything was fine". They put her on Motrin and methocarbamol, which are really not helping very much. He feels better to her when she is walking. There has been no swelling, erythema, or focal weakness. A detailed review of systems was otherwise noncontributory  PAST MEDICAL HISTORY: Past Medical History  Diagnosis Date  . Depression   . Hyperlipidemia   . Hx of adenomatous colonic polyps   . Cholelithiasis   . 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 Cancer      Left breast carcinoma in situ    PAST SURGICAL HISTORY: Past Surgical History  Procedure Date  . Breast lumpectomy oct '10    Left  . Knee arthroscopy     Left '00 Rendall/ Right '08  . Cholecystectomy, laparoscopic '06    [name]  . Lumpectomy- remote     benign  . Total knee arthroplasty 02/05/11    left    FAMILY  HISTORY Family History  Problem Relation Age of Onset  . Diabetes Mother   . Heart disease Mother     cad/MI-fatal  . Breast cancer Neg Hx   . Colon cancer Neg Hx   . Cancer Father     liver  The patient's father died at the age of 69 from "liver cancer".  The patient's mother died in her 62s from heart problems.  The patient has 1 sister.  There is no history of breast or ovarian cancer in the family to her knowledge  GYNECOLOGIC HISTORY: She is GX, P3, first pregnancy to term in her early 46s.  She went through the change of life around age 3 and took  hormones "for many years" without any complications, to her knowledge.  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  Her husband, Ed, worked for [employer] for many years.  They are both now retired and avid golfers.  Daughter, Fannie Knee, lives in [place] and works for [employer].  Daughter, Andrey Campanile, lives in [place] and has 2 daughters.  Daughter Melvenia Beam, lives in [place], [place].  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  The patient attends [employer] in [place].   ADVANCED DIRECTIVES:  HEALTH MAINTENANCE: History  Substance Use Topics  . Smoking status: Never Smoker   . Smokeless tobacco: Not on file  . Alcohol Use: 4.2 oz/week    7 Glasses of wine per week     daily     Colonoscopy:  PAP:  Bone density:  Lipid panel:  Not on File  Current Outpatient Prescriptions  Medication Sig Dispense Refill  . FLUoxetine (PROZAC) 10 MG capsule Take 10 mg by mouth daily.        Marland Kitchen ibuprofen (ADVIL,MOTRIN) 800 MG tablet Take 800 mg by mouth every 8 (eight) hours as needed.        Marland Kitchen letrozole (FEMARA) 2.5 MG tablet Take 2.5 mg by mouth daily.        . methocarbamol (ROBAXIN) 500 MG tablet Take 500 mg by mouth 4 (four) times daily.      . simvastatin (ZOCOR) 5 MG tablet Take 20 mg by mouth at bedtime.       . triamterene-hydrochlorothiazide (MAXZIDE-25) 37.5-25 MG per tablet Take 1 tablet by mouth every morning.         OBJECTIVE: Middle-aged white woman who appears slightly uncomfortable Filed Vitals:   11/21/11 1132  BP: 157/91  Pulse: 66  Temp: 98 F (36.7 C)     Body mass index is 29.25 kg/(m^2).    ECOG FS: 1  Sclerae unicteric Oropharynx clear No cervical or supraclavicular adenopathy Lungs no rales or rhonchi Heart regular rate and rhythm Abd benign MSK no focal spinal tenderness to careful palpation and percussion; no peripheral edema. She is able to do straight leg raising to 85 bilaterally, but with pain  at the hip joint on the left  Neuro: nonfocal Breasts: The right breast is unremarkable. The left breast is status post lumpectomy and radiation. There is no evidence of local recurrence.  LAB RESULTS: Lab Results  Component Value Date   WBC 8.0 11/14/2011   NEUTROABS 5.6 11/14/2011   HGB 13.1 11/14/2011   HCT 38.3 11/14/2011   MCV 99.0 11/14/2011   PLT 213 11/14/2011      Chemistry      Component Value Date/Time   NA 141 11/14/2011 1015   K 4.5 11/14/2011 1015  CL 107 11/14/2011 1015   CO2 25 11/14/2011 1015   BUN 19 11/14/2011 1015   CREATININE 0.90 11/14/2011 1015      Component Value Date/Time   CALCIUM 9.7 11/14/2011 1015   ALKPHOS 55 11/14/2011 1015   AST 11 11/14/2011 1015   ALT 12 11/14/2011 1015   BILITOT 0.9 11/14/2011 1015       Lab Results  Component Value Date   LABCA2 8 04/16/2011    No components found with this basename: [id number]    No results found for this basename: INR:1;PROTIME:1 in the last 168 hours  Urinalysis    Component Value Date/Time   COLORURINE YELLOW 03/01/2011 1140   APPEARANCEUR CLEAR 03/01/2011 1140   LABSPEC 1.013 03/01/2011 1140   PHURINE 6.5 03/01/2011 1140   GLUCOSEU NEGATIVE 03/01/2011 1140   HGBUR NEGATIVE 03/01/2011 1140   BILIRUBINUR NEGATIVE 03/01/2011 1140   KETONESUR NEGATIVE 03/01/2011 1140   PROTEINUR NEGATIVE 03/01/2011 1140   UROBILINOGEN 0.2 03/01/2011 1140   NITRITE NEGATIVE 03/01/2011 1140   LEUKOCYTESUR NEGATIVE 03/01/2011 1140    STUDIES: Bone density November 2012 showed a T score of -0.9 at the spine, and -1.6 at the left femoral neck. Mammography at the same time was unchanged  ASSESSMENT: 76 y.o. [place] woman status post left lumpectomy and sentinel lymph node sampling in October 2010 for a T1B N0, stage IA invasive ductal carcinoma,  grade 1, 100% ER/PR-positive, HER2/neu negative with a low MIB-1 at 11%.  Decided against radiation, on tamoxifen between October 2010 and January 2013 at which time she switched to letrozole.   PLAN:  She is doing very well on the letrozole, and today we discussed issues relating to bone density. She was on calcium but not vitamin D so I have asked her to take vitamin D 1000 mg daily. I have encouraged her to walk a minimum of 45 minutes 5 times a week. As far as her left hip discomfort concerned I, clinically it seems to have been a muscle sprain, and I think with some rest, gentle range of motion exercises and a gradual resumption of her normal activities, it should be fine. If not she will let her primary care physician now.  She will see [place] again in one year. She will see Dr. Luisa Hart again in 6 months from now. She knows to call for any problems that may develop before the next visit      Gor Vestal C    11/21/2011

## 2012-04-02 ENCOUNTER — Ambulatory Visit (INDEPENDENT_AMBULATORY_CARE_PROVIDER_SITE_OTHER): Payer: 59 | Admitting: Internal Medicine

## 2012-04-02 ENCOUNTER — Encounter: Payer: Self-pay | Admitting: Internal Medicine

## 2012-04-02 ENCOUNTER — Other Ambulatory Visit (INDEPENDENT_AMBULATORY_CARE_PROVIDER_SITE_OTHER): Payer: 59

## 2012-04-02 VITALS — BP 130/80 | HR 76 | Temp 97.0°F | Resp 16 | Wt 166.0 lb

## 2012-04-02 DIAGNOSIS — IMO0002 Reserved for concepts with insufficient information to code with codable children: Secondary | ICD-10-CM

## 2012-04-02 DIAGNOSIS — Z23 Encounter for immunization: Secondary | ICD-10-CM

## 2012-04-02 DIAGNOSIS — M1710 Unilateral primary osteoarthritis, unspecified knee: Secondary | ICD-10-CM

## 2012-04-02 DIAGNOSIS — E785 Hyperlipidemia, unspecified: Secondary | ICD-10-CM

## 2012-04-02 DIAGNOSIS — D0590 Unspecified type of carcinoma in situ of unspecified breast: Secondary | ICD-10-CM

## 2012-04-02 DIAGNOSIS — Z0000 Encounter for general adult medical examination without abnormal findings: Secondary | ICD-10-CM

## 2012-04-02 LAB — LIPID PANEL
Cholesterol: 183 mg/dL (ref 0–200)
HDL: 64.5 mg/dL (ref >39.00)
LDL Cholesterol: 105 mg/dL — ABNORMAL HIGH (ref 0–99)
Total CHOL/HDL Ratio: 3
Triglycerides: 70 mg/dL (ref 0.0–149.0)
VLDL: 14 mg/dL (ref 0.0–40.0)

## 2012-04-02 NOTE — Progress Notes (Signed)
Subjective:    Patient ID: Sheila Schmidt, female    DOB: 03-13-1936, 76 y.o.   MRN: [id number]  HPI Ms. Revard presents for evaluation of swelling in the hands right > left and stiffness in the hands. She has had changes in the joints as well.   She is followed by Dr. Darnelle Catalan with last visit in May '13 and she was doing fine.  She is current with Dr. Aldona Bar and is doing well.   Chart reviewed - last colonoscopy was in oct '09; immunizations - Tetanus March '04; Shingles March '12. For Pneumonia vaccine today.   She is on simvastatin 5 mg for a while and tolerates this well but there is no lipid panel in [facility].  She did have an episode back pain and was treated by Dr. Andre Lefort and was treated with steroid. She had a TKR left Sept '12 and has done well with a full recovery.   The patient is here for annual Medicare wellness examination and management of other chronic and acute problems.   The risk factors are reflected in the social history.  The roster of all physicians providing medical care to patient - is listed in the Snapshot section of the chart.  Activities of daily living:  The patient is 100% inedpendent in all ADLs: dressing, toileting, feeding as well as independent mobility  Home safety : The patient has smoke detectors in the home. Fall - no falls, no grab bars in bathroom. They wear seatbelts.  firearms are present in the home, kept in a safe fashion. There is no violence in the home.   There is no risks for hepatitis, STDs or HIV. There is no   history of blood transfusion. They have no travel history to infectious disease endemic areas of the world.  The patient has seen their dentist in the last six month. They have seen their eye doctor in the last year. They deny any hearing difficulty and have not had audiologic testing in the last year.    They do have excessive sun exposure. Discussed the need for sun protection: hats, long sleeves and use of sunscreen if there is  significant sun exposure.   Diet: the importance of a healthy diet is discussed. They do have a healthy diet.  The patient has a regular exercise program: gym - variety of things including spin class , 45-60 min duration, 5 per week.  The benefits of regular aerobic exercise were discussed.  Depression screen: there are no signs or vegative symptoms of depression- irritability, change in appetite, anhedonia, sadness/tearfullness.  Cognitive assessment: the patient manages all their financial and personal affairs and is actively engaged.   Vision, hearing, body mass index were assessed and reviewed.   During the course of the visit the patient was educated and counseled about appropriate screening and preventive services including : fall prevention , diabetes screening, nutrition counseling, colorectal cancer screening, and recommended immunizations.   Past Medical History  Diagnosis Date  . Depression   . Hyperlipidemia   . Hx of adenomatous colonic polyps   . Cholelithiasis   . 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 Cancer      Left breast carcinoma in situ   Past Surgical History  Procedure Date  . Breast lumpectomy oct '10    Left  . Knee arthroscopy     Left '00 Rendall/ Right '08  . Cholecystectomy, laparoscopic '06    [name]  .  Lumpectomy- remote     benign  . Total knee arthroplasty 02/05/11    left   Family History  Problem Relation Age of Onset  . Diabetes Mother   . Heart disease Mother     cad/MI-fatal  . Breast cancer Neg Hx   . Colon cancer Neg Hx   . Cancer Father     liver   History   Social History  . Marital Status: Married    Spouse Name: N/A    Number of Children: 3  . Years of Education: N/A   Occupational History  . 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 Smoking status: Never Smoker   . Smokeless tobacco: Never Used  . Alcohol Use: 4.2 oz/week    7 Glasses of  wine per week     daily  . Drug Use: No  . Sexually Active: No   Other Topics Concern  . Not on file   Social History Narrative   [employer]. [employer]. Married '59, 3 daughters- '61, '64, '73; 2 grandchildren.Marriage in good health. Exercise - goes to gym 5/wk. ACP - directed to the [email or web address]    Current Outpatient Prescriptions on File Prior to Visit  Medication Sig Dispense Refill  . FLUoxetine (PROZAC) 10 MG capsule Take 10 mg by mouth daily.        Marland Kitchen ibuprofen (ADVIL,MOTRIN) 800 MG tablet Take 800 mg by mouth every 8 (eight) hours as needed.        Marland Kitchen letrozole (FEMARA) 2.5 MG tablet Take 2.5 mg by mouth daily.        . methocarbamol (ROBAXIN) 500 MG tablet Take 500 mg by mouth 4 (four) times daily.      . simvastatin (ZOCOR) 5 MG tablet Take 20 mg by mouth at bedtime.       . triamterene-hydrochlorothiazide (MAXZIDE-25) 37.5-25 MG per tablet Take 1 tablet by mouth every morning.           Review of Systems Constitutional:  Negative for fever, chills, activity change and unexpected weight change.  HEENT:  Negative for hearing loss, ear pain, congestion, neck stiffness and postnasal drip. Negative for sore throat or swallowing problems. Negative for dental complaints.   Eyes: Negative for vision loss or change in visual acuity.  Respiratory: Negative for chest tightness and wheezing. Negative for DOE.   Cardiovascular: Negative for chest pain or palpitations. No decreased exercise tolerance Gastrointestinal: No change in bowel habit. No bloating or gas. No reflux or indigestion Genitourinary: Negative for urgency, frequency, flank pain and difficulty urinating.  Musculoskeletal: Negative for myalgias, back pain, arthralgias and gait problem. Positive for transient paresthesia in both hands.  Neurological: Negative for dizziness, tremors, weakness and headaches.  Hematological: Negative for adenopathy.  Psychiatric/Behavioral: Negative for behavioral problems and dysphoric  mood.       Objective:   Physical Exam Filed Vitals:   04/02/12 1418  BP: 130/80  Pulse: 76  Temp: 97 F (36.1 C)  Resp: 16   Wt Readings from Last 3 Encounters:  04/02/12 166 lb (75.297 kg)  11/21/11 165 lb 1.6 oz (74.889 kg)  11/14/11 166 lb 6.4 oz (75.479 kg)   Gen'l: well nourished, well developed white woman in no distress HEENT - Claude/AT, EACs/TMs normal, oropharynx with native dentition in good condition, no buccal or palatal lesions, posterior pharynx clear, mucous membranes moist. C&S clear, PERRLA, fundi - normal Neck - supple, no  thyromegaly Nodes- negative submental, cervical, supraclavicular regions Chest - no deformity, no CVAT Lungs - clear without rales, wheezes. No increased work of breathing Breast - deferred Cardiovascular - regular rate and rhythm, quiet precordium, no murmurs, rubs or gallops, 2+ radial, DP and PT pulses Abdomen - BS+ x 4, no HSM, no guarding or rebound or tenderness Pelvic - deferred  Rectal - deferred  Extremities - no clubbing, cyanosis, edema or deformity.  Neuro - A&O x 3, CN II-XII normal, motor strength normal and equal, DTRs 2+ and symmetrical biceps, radial, and patellar tendons. Cerebellar - no tremor, no rigidity, fluid movement and normal gait. Derm - Head, neck, back, abdomen and extremities without suspicious lesions  Lab Results  Component Value Date   WBC 8.0 11/14/2011   HGB 13.1 11/14/2011   HCT 38.3 11/14/2011   PLT 213 11/14/2011   GLUCOSE 90 11/14/2011   CHOL 183 04/02/2012   TRIG 70.0 04/02/2012   HDL 64.50 04/02/2012   LDLCALC 105* 04/02/2012   ALT 12 11/14/2011   AST 11 11/14/2011   NA 141 11/14/2011   K 4.5 11/14/2011   CL 107 11/14/2011   CREATININE 0.90 11/14/2011   BUN 19 11/14/2011   CO2 25 11/14/2011   INR 1.03 03/01/2011           Assessment & Plan:

## 2012-04-02 NOTE — Patient Instructions (Addendum)
Hand swelling and stiffness with enlarged joints is consistent with osteoarthritis. Plan-  Use it or loose it  Heat helps - when your hands are stiff a warm soak will help a lot  For any pain it is ok to use otc Aleve or Motrin (generics always good)  Elevated cholesterol - will need to check lab today with recommendations to follow.   Immunizations - will give you pneumonia vaccine today and your will be done for life.   Mammogram - I don't know if "3-D" is better or not.   Bone health - bone density study with mild bone loss = osteopenia. Treatment: calcium 1200 mg daily (diet + supplement), Vitamin D 800-1,000 iu daily. You did have a vitamin D level check and it was 34 = normal.   In general - you seem to be doing fine.

## 2012-04-06 DIAGNOSIS — Z0000 Encounter for general adult medical examination without abnormal findings: Secondary | ICD-10-CM | POA: Insufficient documentation

## 2012-04-06 NOTE — Assessment & Plan Note (Signed)
Stable and doing well. She follows with Dr. Darnelle Catalan - recent notes reviewed.

## 2012-04-06 NOTE — Assessment & Plan Note (Signed)
On very low dose simvastatin LDL is just fine.  Plan - continue present medication

## 2012-04-06 NOTE — Assessment & Plan Note (Signed)
Chronic problem that does limit her activities to some degree  Plan  She doesn't want surgery at this point  She should discuss hyaluronidase injections (synvisc,etc) with her orthopedist.

## 2012-04-06 NOTE — Assessment & Plan Note (Signed)
Interval history notable for on-going knee trouble otherwise she is doing well. Limited physical exam is normal. Labs reviewed and are in normal range. She is current with colorectal cancer screening and up to date with radiographic follow up of her breast cancer. Immunizations are up to date.  IN summary - a delightful woman who appears to be medically stable at this time. She will return in 1 year, sooner as needed.

## 2012-05-12 ENCOUNTER — Other Ambulatory Visit: Payer: Self-pay | Admitting: Oncology

## 2012-05-12 DIAGNOSIS — D0590 Unspecified type of carcinoma in situ of unspecified breast: Secondary | ICD-10-CM

## 2012-06-18 ENCOUNTER — Other Ambulatory Visit: Payer: Self-pay | Admitting: Oncology

## 2012-06-18 DIAGNOSIS — Z853 Personal history of malignant neoplasm of breast: Secondary | ICD-10-CM

## 2012-06-26 ENCOUNTER — Ambulatory Visit
Admission: RE | Admit: 2012-06-26 | Discharge: 2012-06-26 | Disposition: A | Discharge status: Home / Self Care | Payer: 59 | Source: Ambulatory Visit | Attending: Oncology | Admitting: Oncology

## 2012-06-26 DIAGNOSIS — Z853 Personal history of malignant neoplasm of breast: Secondary | ICD-10-CM

## 2012-09-19 ENCOUNTER — Encounter (INDEPENDENT_AMBULATORY_CARE_PROVIDER_SITE_OTHER): Payer: Self-pay | Admitting: Surgery

## 2012-09-24 ENCOUNTER — Telehealth: Payer: Self-pay | Admitting: *Deleted

## 2012-09-24 NOTE — Telephone Encounter (Signed)
sw pt informed her that AGB will be on pal 6/12. gv appt d/t for 6/11. Pt is aware...td

## 2012-10-09 ENCOUNTER — Emergency Department (HOSPITAL_BASED_OUTPATIENT_CLINIC_OR_DEPARTMENT_OTHER): Payer: Medicare Other

## 2012-10-09 ENCOUNTER — Emergency Department (HOSPITAL_BASED_OUTPATIENT_CLINIC_OR_DEPARTMENT_OTHER)
Admission: EM | Admit: 2012-10-09 | Discharge: 2012-10-09 | Disposition: A | Discharge status: Home / Self Care | Payer: Medicare Other | Attending: Emergency Medicine | Admitting: Emergency Medicine

## 2012-10-09 ENCOUNTER — Encounter (HOSPITAL_BASED_OUTPATIENT_CLINIC_OR_DEPARTMENT_OTHER): Payer: Self-pay

## 2012-10-09 DIAGNOSIS — Z8719 Personal history of other diseases of the digestive system: Secondary | ICD-10-CM | POA: Insufficient documentation

## 2012-10-09 DIAGNOSIS — Z853 Personal history of malignant neoplasm of breast: Secondary | ICD-10-CM | POA: Insufficient documentation

## 2012-10-09 DIAGNOSIS — Z860100 Personal history of colon polyps, unspecified: Secondary | ICD-10-CM | POA: Insufficient documentation

## 2012-10-09 DIAGNOSIS — H6120 Impacted cerumen, unspecified ear: Secondary | ICD-10-CM | POA: Insufficient documentation

## 2012-10-09 DIAGNOSIS — F3289 Other specified depressive episodes: Secondary | ICD-10-CM | POA: Insufficient documentation

## 2012-10-09 DIAGNOSIS — Z79899 Other long term (current) drug therapy: Secondary | ICD-10-CM | POA: Insufficient documentation

## 2012-10-09 DIAGNOSIS — J189 Pneumonia, unspecified organism: Secondary | ICD-10-CM | POA: Insufficient documentation

## 2012-10-09 DIAGNOSIS — H6121 Impacted cerumen, right ear: Secondary | ICD-10-CM

## 2012-10-09 DIAGNOSIS — Z8739 Personal history of other diseases of the musculoskeletal system and connective tissue: Secondary | ICD-10-CM | POA: Insufficient documentation

## 2012-10-09 DIAGNOSIS — F329 Major depressive disorder, single episode, unspecified: Secondary | ICD-10-CM | POA: Insufficient documentation

## 2012-10-09 DIAGNOSIS — J069 Acute upper respiratory infection, unspecified: Secondary | ICD-10-CM | POA: Insufficient documentation

## 2012-10-09 DIAGNOSIS — E785 Hyperlipidemia, unspecified: Secondary | ICD-10-CM | POA: Insufficient documentation

## 2012-10-09 MED ORDER — CEFTRIAXONE SODIUM 1 G IJ SOLR
1.0000 g | Freq: Once | INTRAMUSCULAR | Status: AC
  Administered 2012-10-09: 1 g via INTRAMUSCULAR
  Filled 2012-10-09: qty 10

## 2012-10-09 MED ORDER — LIDOCAINE HCL (PF) 1 % IJ SOLN
INTRAMUSCULAR | Status: AC
  Administered 2012-10-09: 5 mL
  Filled 2012-10-09: qty 5

## 2012-10-09 MED ORDER — DOCUSATE SODIUM 50 MG/5ML PO LIQD
ORAL | Status: AC
  Administered 2012-10-09: 100 mg
  Filled 2012-10-09: qty 10

## 2012-10-09 MED ORDER — AZITHROMYCIN 250 MG PO TABS: ORAL_TABLET | ORAL | Status: DC

## 2012-10-09 NOTE — ED Notes (Signed)
C/o dry cough, runny nose and right ear "stopped up" x 4 days

## 2012-10-09 NOTE — ED Notes (Signed)
Chart reviewed and care assumed. 

## 2012-10-09 NOTE — ED Notes (Signed)
Pt. Reports she may have a sinus infection due to her R ear is stopped up and she has had a runny nose.

## 2012-10-09 NOTE — ED Provider Notes (Signed)
History     CSN: [id number]  Arrival date & time 10/09/12  1311   First MD Initiated Contact with Patient 10/09/12 1431      Chief Complaint  Patient presents with  . URI    (Consider location/radiation/quality/duration/timing/severity/associated sxs/prior treatment) Patient is a 77 y.o. female presenting with URI. The history is provided by the patient. No language interpreter was used.  URI Presenting symptoms: rhinorrhea   Severity:  Moderate Onset quality:  Sudden Duration:  4 days Timing:  Constant Progression:  Worsening Chronicity:  New Relieved by:  Nothing Worsened by:  Nothing tried Ineffective treatments:  None tried Risk factors: being elderly   Pt also complains of decreased hearing from right ear  Past Medical History  Diagnosis Date  . Depression   . Hyperlipidemia   . Hx of adenomatous colonic polyps   . Cholelithiasis   . 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 Cancer      Left breast carcinoma in situ    Past Surgical History  Procedure Laterality Date  . Breast lumpectomy  oct '10    Left  . Knee arthroscopy      Left '00 Rendall/ Right '08  . Cholecystectomy, laparoscopic  '06    [name]  . Lumpectomy- remote      benign  . Total knee arthroplasty  02/05/11    left  . Cholecystectomy      Family History  Problem Relation Age of Onset  . Diabetes Mother   . Heart disease Mother     cad/MI-fatal  . Breast cancer Neg Hx   . Colon cancer Neg Hx   . Cancer Father     liver    History  Substance Use Topics  . Smoking status: Never Smoker   . Smokeless tobacco: Never Used  . Alcohol Use: 0.0 oz/week    OB History   Grav Para Term Preterm Abortions TAB SAB Ect Mult Living   3 3              Review of Systems  HENT: Positive for rhinorrhea.   All other systems reviewed and are negative.    Allergies  Review of patient's allergies indicates no known allergies.  Home Medications   Current Outpatient Rx   Name  Route  Sig  Dispense  Refill  . FLUoxetine (PROZAC) 10 MG capsule   Oral   Take 10 mg by mouth daily.           Marland Kitchen ibuprofen (ADVIL,MOTRIN) 800 MG tablet   Oral   Take 800 mg by mouth every 8 (eight) hours as needed.           Marland Kitchen letrozole (FEMARA) 2.5 MG tablet      TAKE 1 TABLET BY MOUTH ONE TIME DAILY STARTING JANUARY 1 , 2013   30 tablet   6   . methocarbamol (ROBAXIN) 500 MG tablet   Oral   Take 500 mg by mouth 4 (four) times daily.         . simvastatin (ZOCOR) 5 MG tablet   Oral   Take 20 mg by mouth at bedtime.          . triamterene-hydrochlorothiazide (MAXZIDE-25) 37.5-25 MG per tablet   Oral   Take 1 tablet by mouth every morning.            BP 130/77  Pulse 72  Temp(Src) 97.9 F (36.6 C) (Oral)  Resp 20  Ht 5\' 2"  (1.575 m)  Wt 155 lb (70.308 kg)  BMI 28.34 kg/m2  SpO2 95%  Physical Exam  Nursing note and vitals reviewed. Constitutional: She appears well-developed and well-nourished.  HENT:  Head: Normocephalic and atraumatic.  rm tm occluded by wax,  Eyes: Conjunctivae and EOM are normal. Pupils are equal, round, and reactive to light.  Neck: Normal range of motion. Neck supple.  Cardiovascular: Normal rate.   Pulmonary/Chest: Effort normal and breath sounds normal.  Abdominal: Soft.  Musculoskeletal: Normal range of motion.  Neurological: She is alert.  Skin: Skin is warm.    ED Course  Procedures (including critical care time)  Labs Reviewed - No data to display Dg Chest 2 View  10/09/2012  *RADIOLOGY REPORT*  Clinical Data: Cough, chest pain  CHEST - 2 VIEW  Comparison: 03/01/2011  Findings: Mild patchy opacity in the left upper lobe, possibly reflecting pneumonia.  No pleural effusion or pneumothorax.  The heart is normal in size.  Mild degenerative changes of the visualized thoracolumbar spine.  Cholecystectomy clips.  IMPRESSION: Mild patchy opacity in the left upper lobe, possibly reflecting pneumonia.   Original Report  Authenticated By: Charline Bills, M.D.      No diagnosis found.    MDM  Ears irrigatted,   Chest xray shows possible pneumonia.  Pt given rocephin and zithromax        Elson Areas, PA-C 10/09/12 1533

## 2012-10-10 NOTE — ED Provider Notes (Signed)
Medical screening examination/treatment/procedure(s) were conducted as a shared visit with non-physician practitioner(s) and myself.  I personally evaluated the patient during the encounter   Rolan Bucco, MD 10/10/12 1451

## 2012-10-17 ENCOUNTER — Ambulatory Visit (INDEPENDENT_AMBULATORY_CARE_PROVIDER_SITE_OTHER): Payer: 59 | Admitting: Internal Medicine

## 2012-10-17 ENCOUNTER — Encounter: Payer: Self-pay | Admitting: Internal Medicine

## 2012-10-17 VITALS — BP 138/90 | HR 68 | Temp 98.4°F | Resp 16 | Wt 169.0 lb

## 2012-10-17 DIAGNOSIS — J189 Pneumonia, unspecified organism: Secondary | ICD-10-CM | POA: Insufficient documentation

## 2012-10-17 NOTE — Progress Notes (Signed)
  Subjective:    Patient ID: Sheila Schmidt, female    DOB: 1936/01/09, 77 y.o.   MRN: [id number]  HPI  F/u ER visit -for CAP - treated. Doing well  5/1/14CXR: IMPRESSION:  Mild patchy opacity in the left upper lobe, possibly reflecting  pneumonia.  Review of Systems  Constitutional: Negative.  Negative for fever, chills, diaphoresis, activity change, appetite change, fatigue and unexpected weight change.  HENT: Negative for hearing loss, ear pain, nosebleeds, congestion, sore throat, facial swelling, rhinorrhea, sneezing, mouth sores, trouble swallowing, neck pain, neck stiffness, postnasal drip, sinus pressure and tinnitus.   Eyes: Negative for pain, discharge, redness, itching and visual disturbance.  Respiratory: Positive for cough (much better). Negative for chest tightness, shortness of breath, wheezing and stridor.   Cardiovascular: Negative for chest pain, palpitations and leg swelling.  Gastrointestinal: Negative for nausea, diarrhea, constipation, blood in stool, abdominal distention, anal bleeding and rectal pain.  Genitourinary: Negative for dysuria, urgency, frequency, hematuria, flank pain, vaginal bleeding, vaginal discharge, difficulty urinating, genital sores and pelvic pain.  Musculoskeletal: Negative for back pain, joint swelling, arthralgias and gait problem.  Skin: Negative.  Negative for rash.  Neurological: Negative for dizziness, tremors, seizures, syncope, speech difficulty, weakness, numbness and headaches.  Hematological: Negative for adenopathy. Does not bruise/bleed easily.  Psychiatric/Behavioral: Negative for suicidal ideas, behavioral problems, sleep disturbance, dysphoric mood and decreased concentration. The patient is not nervous/anxious.        Objective:   Physical Exam  Constitutional: She appears well-developed. No distress.  obese  HENT:  Head: Normocephalic.  Right Ear: External ear normal.  Left Ear: External ear normal.  Nose: Nose normal.   Mouth/Throat: Oropharynx is clear and moist.  Eyes: Conjunctivae are normal. Pupils are equal, round, and reactive to light. Right eye exhibits no discharge. Left eye exhibits no discharge.  Neck: Normal range of motion. Neck supple. No JVD present. No tracheal deviation present. No thyromegaly present.  Cardiovascular: Normal rate, regular rhythm and normal heart sounds.   Pulmonary/Chest: No stridor. No respiratory distress. She has no wheezes. She has no rales. She exhibits no tenderness.  Abdominal: Soft. Bowel sounds are normal. She exhibits no distension and no mass. There is no tenderness. There is no rebound and no guarding.  Musculoskeletal: She exhibits no edema and no tenderness.  Lymphadenopathy:    She has no cervical adenopathy.  Neurological: She displays normal reflexes. No cranial nerve deficit. She exhibits normal muscle tone. Coordination normal.  Skin: No rash noted. No erythema.  Psychiatric: She has a normal mood and affect. Her behavior is normal. Judgment and thought content normal.      CXR    Assessment & Plan:

## 2012-10-17 NOTE — Assessment & Plan Note (Addendum)
5/1/14CXR: IMPRESSION:  Mild patchy opacity in the left upper lobe, possibly reflecting  pneumonia.   Better, finished Z pac - clinically is doing well RTC 2 mo w/CXR Dr Debby Bud

## 2012-11-13 ENCOUNTER — Other Ambulatory Visit (HOSPITAL_BASED_OUTPATIENT_CLINIC_OR_DEPARTMENT_OTHER): Payer: Medicare Other | Admitting: Lab

## 2012-11-13 DIAGNOSIS — Z23 Encounter for immunization: Secondary | ICD-10-CM

## 2012-11-13 DIAGNOSIS — C50119 Malignant neoplasm of central portion of unspecified female breast: Secondary | ICD-10-CM

## 2012-11-13 LAB — CBC WITH DIFFERENTIAL/PLATELET
Basophils Absolute: 0.1 10*3/uL (ref 0.0–0.1)
Eosinophils Absolute: 0.1 10*3/uL (ref 0.0–0.5)
HGB: 14.5 g/dL (ref 11.6–15.9)
MONO#: 0.8 10*3/uL (ref 0.1–0.9)
NEUT#: 4.7 10*3/uL (ref 1.5–6.5)
RDW: 12.6 % (ref 11.2–14.5)
lymph#: 2.6 10*3/uL (ref 0.9–3.3)

## 2012-11-13 LAB — COMPREHENSIVE METABOLIC PANEL (CC13)
AST: 13 U/L (ref 5–34)
Albumin: 3.7 g/dL (ref 3.5–5.0)
BUN: 13.1 mg/dL (ref 7.0–26.0)
Calcium: 10.1 mg/dL (ref 8.4–10.4)
Chloride: 106 mEq/L (ref 98–107)
Glucose: 95 mg/dl (ref 70–99)
Potassium: 4.4 mEq/L (ref 3.5–5.1)

## 2012-11-19 ENCOUNTER — Telehealth: Payer: Self-pay | Admitting: *Deleted

## 2012-11-19 ENCOUNTER — Ambulatory Visit (HOSPITAL_BASED_OUTPATIENT_CLINIC_OR_DEPARTMENT_OTHER): Payer: Medicare Other | Admitting: Physician Assistant

## 2012-11-19 ENCOUNTER — Encounter: Payer: Self-pay | Admitting: Physician Assistant

## 2012-11-19 VITALS — BP 138/78 | HR 68 | Temp 98.4°F | Resp 20 | Ht 62.0 in | Wt 167.5 lb

## 2012-11-19 DIAGNOSIS — Z853 Personal history of malignant neoplasm of breast: Secondary | ICD-10-CM

## 2012-11-19 DIAGNOSIS — M8580 Other specified disorders of bone density and structure, unspecified site: Secondary | ICD-10-CM | POA: Insufficient documentation

## 2012-11-19 DIAGNOSIS — D0592 Unspecified type of carcinoma in situ of left breast: Secondary | ICD-10-CM

## 2012-11-19 DIAGNOSIS — R17 Unspecified jaundice: Secondary | ICD-10-CM

## 2012-11-19 DIAGNOSIS — Z780 Asymptomatic menopausal state: Secondary | ICD-10-CM

## 2012-11-19 DIAGNOSIS — Z170 Estrogen receptor positive status [ER+]: Secondary | ICD-10-CM

## 2012-11-19 DIAGNOSIS — C50119 Malignant neoplasm of central portion of unspecified female breast: Secondary | ICD-10-CM

## 2012-11-19 MED ORDER — LETROZOLE 2.5 MG PO TABS: 2.5000 mg | ORAL_TABLET | Freq: Every day | ORAL | Status: DC

## 2012-11-19 NOTE — Telephone Encounter (Signed)
appts made and printed.the patient is aware that Dr. Luisa Hart office will call her with her Jan. appt in Dec....td

## 2012-11-19 NOTE — Progress Notes (Signed)
ID: Sheila Schmidt   DOB: 1936-04-02  MR#: [id number]  WUJ#:[id number]  PCP:  Illene Regulus, MD GYN:  Annamaria Helling, MD SU:  Harriette Bouillon, MD Other:      HISTORY OF PRESENT ILLNESS: Sheila Schmidt had a screening mammogram at Dr. Mechele Collin office at [facility] 02/11/2009, which showed a possible mass in the left breast.  She was referred to [facility] for diagnostic studies and on 02/22/2009, Dr. Chilton Si confirmed the presence of a spiculated mass in the left breast at the 9 o'clock location, which was not palpable by physical exam.  Ultrasound showed a spiculated hypoechoic shadowing mass in the left breast measuring 6 mm corresponding to the screening abnormality.  Biopsy was recommended and performed the same day.  This showed ([id number] and [id number]), a low-grade ductal carcinoma which was ER and PR positive, both at 100%, with a low MIB-1 at 11%.  The tumor did not over-express HER-2 by CISH, with a ratio of 1.27.    The patient was referred to Dr. Luisa Hart and on 03/02/2009 bilateral breast MRIs were obtained.  This showed no additional abnormality.  The solitary irregular mass in the left breast measured 9 mm by MRI.  There was no evidence of abnormal adenopathy, and no findings in the right breast.  There was a small intramammary lymph node in the middle third of the outer left breast corresponding to a stable intramammary lymph node dating back all the way to 2007.  With this information, the patient proceeded to definitive left lumpectomy and sentinel node sampling 03/22/2009.  The final pathology there ([id number]) confirmed a 7-mm invasive ductal carcinoma, grade 1, with negative and ample margins.  There was no lymphovascular invasion; 0 of 2 sentinel lymph nodes were involved. Her subsequent history is as detailed below.  INTERVAL HISTORY: Sheila Schmidt returns today for followup of her left breast cancer. She continues on letrozole which she is tolerating well. She has occasional hot flashes,  although they're now to be problematic. She has mild vaginal dryness. She denies any increased joint pain.  Shahad continues to stay very active, going to the gym at least 5 days weekly, and usually playing golf on Sundays. Interval history is remarkable only for recent diagnosis of pneumonia in early May for which she is recovering well. This is being followed by Dr. Debby Bud.     REVIEW OF SYSTEMS: Sheila Schmidt has had no recent fevers or chills. She's had no abnormal bruising or bleeding. She's eating and drinking well and has had no nausea or change in bowel or bladder habits. She denies any abdominal pain. She is due for her next colonoscopy later this year in October. She denies any increased cough, phlegm production, shortness of breath, chest pain, palpitations. She's had no recent headaches and denies any dizziness or change in vision. She's had no peripheral swelling.  A detailed review of systems is otherwise stable and noncontributory.    PAST MEDICAL HISTORY: Past Medical History  Diagnosis Date  . Depression   . Hyperlipidemia   . Hx of adenomatous colonic polyps   . Cholelithiasis   . 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 Cancer      Left breast carcinoma in situ    PAST SURGICAL HISTORY: Past Surgical History  Procedure Laterality Date  . Breast lumpectomy  oct '10    Left  . Knee arthroscopy      Left '00 Rendall/ Right '08  . Cholecystectomy, laparoscopic  '  06    [name]  . Lumpectomy- remote      benign  . Total knee arthroplasty  02/05/11    left  . Cholecystectomy      FAMILY HISTORY Family History  Problem Relation Age of Onset  . Diabetes Mother   . Heart disease Mother     cad/MI-fatal  . Breast cancer Neg Hx   . Colon cancer Neg Hx   . Cancer Father     liver  The patient's father died at the age of 16 from "liver cancer".  The patient's mother died in her 41s from heart problems.  The patient has 1 sister.  There is no history of  breast or ovarian cancer in the family to her knowledge  GYNECOLOGIC HISTORY: She is GX, P3, first pregnancy to term in her early 42s.  She went through the change of life around age 33 and took hormones "for many years" without any complications, to her knowledge.  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  Her husband, Ed, worked for [employer] for many years.  They are both now retired and avid golfers.  Daughter, Fannie Knee, lives in [place] and works for [employer].  Daughter, Andrey Campanile, lives in [place] and has 2 daughters.  Daughter Melvenia Beam, lives in [place], [place].  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  The patient attends [employer] in [place].   ADVANCED DIRECTIVES:  HEALTH MAINTENANCE: History  Substance Use Topics  . Smoking status: Never Smoker   . Smokeless tobacco: Never Used  . Alcohol Use: 0.0 oz/week     Colonoscopy: Oct 2009, Dr. Madilyn Fireman, 1 polyp removed  PAP: Dr. Aldona Bar, June 2014  Bone density:  04/24/2011, osteopenia  Lipid panel:  UTD, Dr. Debby Bud   No Known Allergies  Current Outpatient Prescriptions  Medication Sig Dispense Refill  . Cholecalciferol 1000 UNITS tablet Take 1,000 Units by mouth daily.      Marland Kitchen FLUoxetine (PROZAC) 10 MG capsule Take 10 mg by mouth daily.        Marland Kitchen ibuprofen (ADVIL,MOTRIN) 800 MG tablet Take 800 mg by mouth every 8 (eight) hours as needed.        Marland Kitchen letrozole (FEMARA) 2.5 MG tablet Take 1 tablet (2.5 mg total) by mouth daily.  90 tablet  3  . simvastatin (ZOCOR) 5 MG tablet Take 20 mg by mouth at bedtime.       . triamterene-hydrochlorothiazide (MAXZIDE-25) 37.5-25 MG per tablet Take 1 tablet by mouth every morning.        No current facility-administered medications for this visit.    OBJECTIVE: Middle-aged white woman who appears comfortable and is in no acute distress Filed Vitals:   11/19/12 1136  BP: 138/78  Pulse: 68  Temp: 98.4 F (36.9 C)  Resp: 20      Body mass index is 30.63 kg/(m^2).    ECOG FS: 1 Filed Weights   11/19/12 1136  Weight: 167 lb 8 oz (75.978 kg)    Sclerae unicteric Oropharynx clear No cervical or supraclavicular adenopathy Lungs clear to auscultation bilaterally, no wheezes, no rales or rhonchi Heart regular rate and rhythm Abdomen soft, nontender to palpation, positive bowel sounds, no hepatomegaly MSK no focal spinal tenderness to palpation No peripheral edema.  Neuro: nonfocal,  Well oriented,  friendly affect Breasts: The right breast is unremarkable. The left breast is status post lumpectomy and radiation. There is no evidence of local recurrence.  Axillae are benign bilaterally with no palpable adenopathy    LAB RESULTS: Lab Results  Component Value Date   WBC 8.2 11/13/2012   NEUTROABS 4.7 11/13/2012   HGB 14.5 11/13/2012   HCT 42.7 11/13/2012   MCV 98.6 11/13/2012   PLT 232 11/13/2012      Chemistry      Component Value Date/Time   NA 141 11/13/2012 1100   NA 141 11/14/2011 1015   K 4.4 11/13/2012 1100   K 4.5 11/14/2011 1015   CL 106 11/13/2012 1100   CL 107 11/14/2011 1015   CO2 29 11/13/2012 1100   CO2 25 11/14/2011 1015   BUN 13.1 11/13/2012 1100   BUN 19 11/14/2011 1015   CREATININE 0.8 11/13/2012 1100   CREATININE 0.90 11/14/2011 1015      Component Value Date/Time   CALCIUM 10.1 11/13/2012 1100   CALCIUM 9.7 11/14/2011 1015   ALKPHOS 84 11/13/2012 1100   ALKPHOS 55 11/14/2011 1015   AST 13 11/13/2012 1100   AST 11 11/14/2011 1015   ALT 10 11/13/2012 1100   ALT 12 11/14/2011 1015   BILITOT 1.48* 11/13/2012 1100   BILITOT 0.9 11/14/2011 1015       STUDIES: Bone density November 2012 showed osteopenia with a T score of -0.9 at the spine, and -1.6 at the left femoral neck.  Most recent bilateral mammogram was on 06/26/2012 and was unremarkable.      ASSESSMENT: 78 y.o. [place] woman   (1)  status post left lumpectomy and sentinel lymph node sampling in October 2010 for a T1B N0, stage IA invasive ductal carcinoma,   grade 1, 100% ER/PR-positive, HER2/neu negative with a low MIB-1 at 11%.    (2)  Decided against radiation,   (3)  on tamoxifen between October 2010 and January 2013 at which time she switched to letrozole.    PLAN:  With regards to her breast cancer, Pluma continues to do very well, with no clinical evidence of disease recurrence. She'll continue on the letrozole which I have refilled for another year.  I am going to ask her to come back in approximately 3 weeks to recheck her metabolic panel, specifically to follow the slight elevation in total bilirubin which is a new abnormality. She'll continue to followup with Dr. Debby Bud with whom she has an appointment in July. Per her report, he is planning to repeat a chest x-ray at that time to make sure her pneumonia has cleared.  Otherwise, she is due for her next mammogram and bone density in January, after which she'll be seeing Dr. Luisa Hart for routine followup. We will then see her one year from now, June 2015 for repeat labs and physical exam. Patient voices understanding and agreement with our plan, and knows to call with any changes or problems prior to her next appointment.    Fount Bahe    11/19/2012

## 2012-11-20 ENCOUNTER — Ambulatory Visit: Payer: Medicare Other | Admitting: Physician Assistant

## 2012-12-08 ENCOUNTER — Other Ambulatory Visit: Payer: Self-pay | Admitting: Physician Assistant

## 2012-12-08 ENCOUNTER — Other Ambulatory Visit (HOSPITAL_BASED_OUTPATIENT_CLINIC_OR_DEPARTMENT_OTHER): Payer: Medicare Other

## 2012-12-08 DIAGNOSIS — C50419 Malignant neoplasm of upper-outer quadrant of unspecified female breast: Secondary | ICD-10-CM

## 2012-12-08 DIAGNOSIS — Z853 Personal history of malignant neoplasm of breast: Secondary | ICD-10-CM

## 2012-12-08 DIAGNOSIS — R17 Unspecified jaundice: Secondary | ICD-10-CM

## 2012-12-08 LAB — COMPREHENSIVE METABOLIC PANEL (CC13)
ALT: 12 U/L (ref 0–55)
AST: 12 U/L (ref 5–34)
Calcium: 10.1 mg/dL (ref 8.4–10.4)
Chloride: 106 mEq/L (ref 98–109)
Creatinine: 0.8 mg/dL (ref 0.6–1.1)
Potassium: 4 mEq/L (ref 3.5–5.1)

## 2012-12-15 ENCOUNTER — Ambulatory Visit (INDEPENDENT_AMBULATORY_CARE_PROVIDER_SITE_OTHER): Payer: Self-pay | Admitting: Surgery

## 2012-12-16 ENCOUNTER — Encounter: Payer: Self-pay | Admitting: Internal Medicine

## 2012-12-16 ENCOUNTER — Ambulatory Visit (INDEPENDENT_AMBULATORY_CARE_PROVIDER_SITE_OTHER): Payer: Medicare Other | Admitting: Internal Medicine

## 2012-12-16 VITALS — BP 150/92 | HR 64 | Temp 97.9°F | Resp 12 | Ht 62.0 in | Wt 165.8 lb

## 2012-12-16 DIAGNOSIS — J189 Pneumonia, unspecified organism: Secondary | ICD-10-CM

## 2012-12-16 NOTE — Assessment & Plan Note (Signed)
Soft call on diagnosis of pneumonia but successful treatment with no evidence of residual disease.

## 2012-12-16 NOTE — Progress Notes (Signed)
Subjective:    Patient ID: Sheila Schmidt, female    DOB: 20-Jul-1935, 77 y.o.   MRN: [id number]  HPI Sheila Schmidt presents for follow up. May 1 she was seen at [facility] for cerumen impaction. Due to a cough she had a cxr revealing questionable LUL opacity. She had no fever, no purulent sputum, normal chest exam and no lab was done. She was treated with single dose of Rocephin and a course of azithromycin. She saw Dr. Roena Malady in follow up May 9th and she was clear.  At the cancer center she had lab June 5th: CBCD was normal; total Bilirubin had risen from 0.7 to 1.48. Repeat lab June 30th with total bilirubin 1.25. At both times all other liver functions, as well as Bmet, were normal.   She feels good. She is going going to the gym 5 days/wk with 2 days being a "body pump" class for an hour. She plays golf on Sunday.  Past Medical History  Diagnosis Date  . Depression   . Hyperlipidemia   . Hx of adenomatous colonic polyps   . Cholelithiasis   . 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 Cancer      Left breast carcinoma in situ   Past Surgical History  Procedure Laterality Date  . Breast lumpectomy  oct '10    Left  . Knee arthroscopy      Left '00 Rendall/ Right '08  . Cholecystectomy, laparoscopic  '06    [name]  . Lumpectomy- remote      benign  . Total knee arthroplasty  02/05/11    left  . Cholecystectomy     Family History  Problem Relation Age of Onset  . Diabetes Mother   . Heart disease Mother     cad/MI-fatal  . Breast cancer Neg Hx   . Colon cancer Neg Hx   . Cancer Father     liver   History   Social History  . Marital Status: Married    Spouse Name: N/A    Number of Children: 3  . Years of Education: N/A   Occupational History  . 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 Smoking status: Never Smoker   . Smokeless tobacco: Never Used  . Alcohol Use: 0.0 oz/week  . Drug Use: No   . Sexually Active: Not on file   Other Topics Concern  . Not on file   Social History Narrative   [employer]. HSG. Married '59, 3 daughters- '61, '64, '73; 2 grandchildren.Marriage in good health. Exercise - goes to gym 5/wk. ACP - directed to the [email or web address]    Current Outpatient Prescriptions on File Prior to Visit  Medication Sig Dispense Refill  . Cholecalciferol 1000 UNITS tablet Take 1,000 Units by mouth daily.      Marland Kitchen FLUoxetine (PROZAC) 10 MG capsule Take 10 mg by mouth daily.        Marland Kitchen ibuprofen (ADVIL,MOTRIN) 800 MG tablet Take 800 mg by mouth every 8 (eight) hours as needed.        Marland Kitchen letrozole (FEMARA) 2.5 MG tablet Take 1 tablet (2.5 mg total) by mouth daily.  90 tablet  3  . simvastatin (ZOCOR) 5 MG tablet Take 20 mg by mouth at bedtime.       . triamterene-hydrochlorothiazide (MAXZIDE-25) 37.5-25 MG per tablet Take 1 tablet by mouth every morning.  No current facility-administered medications on file prior to visit.      Review of Systems System review is negative for any constitutional, cardiac, pulmonary, GI or neuro symptoms or complaints other than as described in the HPI.     Objective:   Physical Exam Filed Vitals:   12/16/12 1309  BP: 150/92  Pulse: 64  Temp: 97.9 F (36.6 C)  Resp: 12   Wt Readings from Last 3 Encounters:  12/16/12 165 lb 12.8 oz (75.206 kg)  11/19/12 167 lb 8 oz (75.978 kg)  10/17/12 169 lb (76.658 kg)   Gen'l: well nourished, well developed white Woman in no distress HEENT - [place]/AT, EACs/TMs normal, oropharynx with native dentition in good condition, no buccal or palatal lesions, posterior pharynx clear, mucous membranes moist. C&S clear, PERRLA Neck - supple, no thyromegaly Nodes- negative submental, cervical, supraclavicular regions Chest - no deformity, no CVAT Lungs - clear without rales, wheezes. No increased work of breathing Breast - deferred Cardiovascular - regular rate and rhythm, quiet precordium, no  murmurs with careful auscultation, rubs or gallops, 2+ radial Pelvic - deferred to gyn Rectal - deferred to gyn Extremities - no clubbing, cyanosis, edema or deformity.  Neuro - A&O x 3, CN II-XII normal, motor strength normal and equal, DTRs 2+ and symmetrical biceps, radial, and patellar tendons. Cerebellar - no tremor, no rigidity, fluid movement and normal gait. Derm - Head, neck, back, abdomen and extremities without suspicious lesions        Assessment & Plan:  Reviewed the data from May 1: only 2 of 5 criteria met for true pneumonia and the chest x-ray is a soft call. Follow up with Dr. Posey Rea - all was fine. Exam today is normal.  Labs from cancer center reviewed and all was normal except for a mild elevation in total Bilirubin which when rechecked was coming down. No serious issue here  You are current with immunizations, mammography, Gyn exam and colonoscopy.  You are doing GREAT. Enjoy.

## 2012-12-16 NOTE — Patient Instructions (Addendum)
Good to see you - I am very proud of you for making such an investment in your health by going to the gym 5 days a week. On the golf course - broad brimmed hat and sun screen!!  Reviewed the data from May 1: only 2 of 5 criteria met for true pneumonia and the chest x-ray is a soft call. Follow up with Dr. Posey Rea - all was fine. Exam today is normal.  Labs from cancer center reviewed and all was normal except for a mild elevation in total Bilirubin which when rechecked was coming down. No serious issue here  You are current with immunizations, mammography, Gyn exam and colonoscopy.  You are doing GREAT. Enjoy.

## 2013-03-23 ENCOUNTER — Encounter: Payer: Self-pay | Admitting: Internal Medicine

## 2013-03-23 ENCOUNTER — Ambulatory Visit (INDEPENDENT_AMBULATORY_CARE_PROVIDER_SITE_OTHER): Payer: Medicare Other | Admitting: Internal Medicine

## 2013-03-23 VITALS — BP 130/90 | HR 64 | Temp 98.5°F | Wt 169.4 lb

## 2013-03-23 DIAGNOSIS — H6121 Impacted cerumen, right ear: Secondary | ICD-10-CM

## 2013-03-23 DIAGNOSIS — M76892 Other specified enthesopathies of left lower limb, excluding foot: Secondary | ICD-10-CM

## 2013-03-23 DIAGNOSIS — Z23 Encounter for immunization: Secondary | ICD-10-CM

## 2013-03-23 DIAGNOSIS — M6580 Other synovitis and tenosynovitis, unspecified site: Secondary | ICD-10-CM

## 2013-03-23 DIAGNOSIS — H6120 Impacted cerumen, unspecified ear: Secondary | ICD-10-CM

## 2013-03-23 NOTE — Progress Notes (Signed)
Subjective:    Patient ID: Sheila Schmidt, female    DOB: 13-Jan-1936, 77 y.o.   MRN: [id number]  HPI Flesch presents for 3-4 right ear pain/fullness and decreased hearing. She has had no URI symptoms: no fever or chills, no coryza, no rhinorrhea.  A second problem has been pain in the lateral aspect of the left knee that is worse with walking. No crepitus is reported in the knee and no prior h/o knee problems.  Past Medical History  Diagnosis Date  . Depression   . Hyperlipidemia   . Hx of adenomatous colonic polyps   . Cholelithiasis   . 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 Cancer      Left breast carcinoma in situ   Past Surgical History  Procedure Laterality Date  . Breast lumpectomy  oct '10    Left  . Knee arthroscopy      Left '00 Rendall/ Right '08  . Cholecystectomy, laparoscopic  '06    [name]  . Lumpectomy- remote      benign  . Total knee arthroplasty  02/05/11    left  . Cholecystectomy     Family History  Problem Relation Age of Onset  . Diabetes Mother   . Heart disease Mother     cad/MI-fatal  . Breast cancer Neg Hx   . Colon cancer Neg Hx   . Cancer Father     liver   History   Social History  . Marital Status: Married    Spouse Name: N/A    Number of Children: 3  . Years of Education: N/A   Occupational History  . 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 Smoking status: Never Smoker   . Smokeless tobacco: Never Used  . Alcohol Use: 0.0 oz/week  . Drug Use: No  . Sexual Activity: Not on file   Other Topics Concern  . Not on file   Social History Narrative   [employer]. HSG. Married '59, 3 daughters- '61, '64, '73; 2 grandchildren.Marriage in good health. Exercise - goes to gym 5/wk. ACP - directed to the [email or web address]    Current Outpatient Prescriptions on File Prior to Visit  Medication Sig Dispense Refill  . Cholecalciferol 1000 UNITS tablet Take  1,000 Units by mouth daily.      Marland Kitchen FLUoxetine (PROZAC) 10 MG capsule Take 10 mg by mouth daily.        Marland Kitchen ibuprofen (ADVIL,MOTRIN) 800 MG tablet Take 800 mg by mouth every 8 (eight) hours as needed.        Marland Kitchen letrozole (FEMARA) 2.5 MG tablet Take 1 tablet (2.5 mg total) by mouth daily.  90 tablet  3  . simvastatin (ZOCOR) 5 MG tablet Take 20 mg by mouth at bedtime.       . triamterene-hydrochlorothiazide (MAXZIDE-25) 37.5-25 MG per tablet Take 1 tablet by mouth every morning.        No current facility-administered medications on file prior to visit.      Review of Systems System review is negative for any constitutional, cardiac, pulmonary, GI or neuro symptoms or complaints other than as described in the HPI.     Objective:   Physical Exam Filed Vitals:   03/23/13 1057  BP: 130/90  Pulse: 64  Temp: 98.5 F (36.9 C)   Wt Readings from Last 3 Encounters:  03/23/13 169 lb 6.4 oz (76.839  kg)  12/16/12 165 lb 12.8 oz (75.206 kg)  11/19/12 167 lb 8 oz (75.978 kg)   Gen'l- pleasant woman in no distress HEENT - right EAC occluded with cerumen. Left EAC/TM normal Cor- 2+ radial pulse Pulm - normal respirations MSK - left knee with full ROM, no crepitus. Point tenderness is elicited at the lateral aspect of the Tibia at the site of insertion of the vastus lateralis/ tensor fascia latae.       Assessment & Plan:  1. Cerumen impaction right Plan simple irrigation done without difficulty by Ms. Caralee Ates, CMA  2. Tendonitis left knee  Plan Lineament of choice  Patient handout on tendonitis.

## 2013-05-19 ENCOUNTER — Ambulatory Visit (INDEPENDENT_AMBULATORY_CARE_PROVIDER_SITE_OTHER): Payer: Medicare Other | Admitting: Surgery

## 2013-05-19 ENCOUNTER — Encounter (INDEPENDENT_AMBULATORY_CARE_PROVIDER_SITE_OTHER): Payer: Self-pay | Admitting: Surgery

## 2013-05-19 VITALS — BP 156/88 | HR 60 | Resp 14 | Ht 62.0 in | Wt 171.2 lb

## 2013-05-19 DIAGNOSIS — Z853 Personal history of malignant neoplasm of breast: Secondary | ICD-10-CM

## 2013-05-19 NOTE — Patient Instructions (Signed)
Return 1 year. 

## 2013-05-19 NOTE — Progress Notes (Signed)
NAME: Sheila Schmidt       DOB: 11/14/1935           DATE: 05/19/2013       MRN: [id number]   Sheila Schmidt is a 77 y.o.Marland Kitchenfemale who presents for routine followup of her Stage 1 left breast cancerdiagnosed in 10/10 /10 and treated with breast conservation . She has no problems or concerns on either side.  PFSH: She has had no significant changes since the last visit here.  ROS: There have been no significant changes since the last visit here  EXAM:  VS: BP 156/88  Pulse 60  Resp 14  Ht 5\' 2"  (1.575 m)  Wt 171 lb 3.2 oz (77.656 kg)  BMI 31.31 kg/m2   General: The patient is alert, oriented, generally healty appearing, NAD. Mood and affect are normal.  Breasts:  Left breast with post surgical changes. No mass. Right breast normal.  No mass  Lymphatics: She has no axillary or supraclavicular adenopathy on either side.  Extremities: Full ROM of the surgical side with no lymphedema noted.  Data Reviewed: Clinical Data: 77 year old female for annual bilateral mammograms  - history of left breast cancer and lumpectomy in 2010.  DIGITAL DIAGNOSTIC BILATERAL MAMMOGRAM WITH CAD  Comparison: 04/24/2011 and prior mammograms dating back to  03/23/2010.  Findings:  ACR Breast Density Category 2: There is a scattered fibroglandular  pattern.  Routine views of both breasts and a magnification view of the  lumpectomy site demonstrate left breast scarring.  There is no evidence of suspicious mass, nonsurgical distortion or  worrisome calcifications bilaterally.  Mammographic images were processed with CAD.  IMPRESSION:  No mammographic evidence of breast malignancy.  Left breast scarring.  BI-RADS CATEGORY 2: Benign finding(s).  RECOMMENDATION:  Bilateral diagnostic mammograms in 1 year.  I discussed the findings and recommendations with the patient and  her questions answered. She was encouraged to begin/continue  monthly self exams and to contact her primary physician if any   changes noted. A written report was given to the patient.  Original Report Authenticated By: Harmon Pier, M.D.   Impression: Doing well, with no evidence of recurrent cancer or new cancer  Plan: Will continue to follow up on an annual basis here.

## 2013-06-29 ENCOUNTER — Other Ambulatory Visit: Payer: 59

## 2013-07-07 ENCOUNTER — Ambulatory Visit
Admission: RE | Admit: 2013-07-07 | Discharge: 2013-07-07 | Disposition: A | Discharge status: Home / Self Care | Payer: Self-pay | Source: Ambulatory Visit | Attending: Physician Assistant | Admitting: Physician Assistant

## 2013-07-07 ENCOUNTER — Ambulatory Visit
Admission: RE | Admit: 2013-07-07 | Discharge: 2013-07-07 | Disposition: A | Discharge status: Home / Self Care | Payer: Medicare Other | Source: Ambulatory Visit | Attending: Physician Assistant | Admitting: Physician Assistant

## 2013-07-07 DIAGNOSIS — Z780 Asymptomatic menopausal state: Secondary | ICD-10-CM

## 2013-07-07 DIAGNOSIS — M8580 Other specified disorders of bone density and structure, unspecified site: Secondary | ICD-10-CM

## 2013-07-07 DIAGNOSIS — Z853 Personal history of malignant neoplasm of breast: Secondary | ICD-10-CM

## 2013-08-21 ENCOUNTER — Encounter: Payer: Self-pay | Admitting: Internal Medicine

## 2013-09-13 ENCOUNTER — Encounter: Payer: Self-pay | Admitting: Internal Medicine

## 2013-09-25 ENCOUNTER — Encounter: Payer: Self-pay | Admitting: Physician Assistant

## 2013-09-25 ENCOUNTER — Ambulatory Visit (INDEPENDENT_AMBULATORY_CARE_PROVIDER_SITE_OTHER): Payer: Medicare Other | Admitting: Physician Assistant

## 2013-09-25 VITALS — BP 138/86 | HR 68 | Temp 98.0°F | Resp 16 | Ht 62.0 in | Wt 170.2 lb

## 2013-09-25 DIAGNOSIS — Z299 Encounter for prophylactic measures, unspecified: Secondary | ICD-10-CM

## 2013-09-25 DIAGNOSIS — Z23 Encounter for immunization: Secondary | ICD-10-CM

## 2013-09-25 DIAGNOSIS — Z0000 Encounter for general adult medical examination without abnormal findings: Secondary | ICD-10-CM

## 2013-09-25 DIAGNOSIS — E785 Hyperlipidemia, unspecified: Secondary | ICD-10-CM

## 2013-09-25 DIAGNOSIS — H6691 Otitis media, unspecified, right ear: Secondary | ICD-10-CM | POA: Insufficient documentation

## 2013-09-25 DIAGNOSIS — H6690 Otitis media, unspecified, unspecified ear: Secondary | ICD-10-CM

## 2013-09-25 LAB — LIPID PANEL
CHOLESTEROL: 179 mg/dL (ref 0–200)
HDL: 66 mg/dL (ref >39)
LDL Cholesterol: 95 mg/dL (ref 0–99)
TRIGLYCERIDES: 89 mg/dL (ref <150)
Total CHOL/HDL Ratio: 2.7 Ratio
VLDL: 18 mg/dL (ref 0–40)

## 2013-09-25 MED ORDER — AMOXICILLIN 875 MG PO TABS: 875.0000 mg | ORAL_TABLET | Freq: Two times a day (BID) | ORAL | Status: DC

## 2013-09-25 NOTE — Assessment & Plan Note (Signed)
Repeat lipid panel and CMP.

## 2013-09-25 NOTE — Progress Notes (Signed)
Pre visit review using our clinic review tool, if applicable. No additional management support is needed unless otherwise documented below in the visit note/SLS  

## 2013-09-25 NOTE — Progress Notes (Signed)
Sheila Schmidt presents to clinic today to transfer care from Dr. Linda Hedges who has just retired.  Sheila Schmidt also has acute concern.  Sheila Schmidt is c/o right ear pain, pressure, mild tinnitus and decrease in hearing.  Sheila Schmidt with history of deviated septum  Denies allergies.  Denies sinus pain but notes r-sided sinus pressure.  Denies cough, ST, watery eyes, sneezing.   Chronic Issues: Hyperlipidemia -- states lipids checked by Oncology. Last Lipid in system is from 2013.  Sheila Schmidt is fasting.  Endorses taking Zocor daily without myalgias/arthraligias.  Hx Breast Cancer -- followed by Oncology.  Is seen twice per year.  Currently on Femara daily.  Depression -- well-controlled on Prozac daily.  Denies SI/HI.  Medication is prescribed by her OB/GYN.  Health Maintenance: Dentist -- [facility] -- Overdue. Colonoscopy -- last in 2009.  Found a polyp.  Sheila Schmidt is due for repeat colonoscopy. Mammogram -- last in 1/15.  Sheila Schmidt followed by OB/GYN. DEXA -- Endorses osteopenia.  Followed by OB/GYN.  On supplement and exercise regimen.  Past Medical History  Diagnosis Date  . Depression   . Hyperlipidemia   . Hx of adenomatous colonic polyps   . Cholelithiasis   . 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 Cancer      Left breast carcinoma in situ    Current Outpatient Prescriptions on File Prior to Visit  Medication Sig Dispense Refill  . Cholecalciferol 1000 UNITS tablet Take 1,000 Units by mouth daily.      Marland Kitchen FLUoxetine (PROZAC) 10 MG capsule Take 10 mg by mouth daily.        Marland Kitchen ibuprofen (ADVIL,MOTRIN) 800 MG tablet Take 800 mg by mouth every 8 (eight) hours as needed.        Marland Kitchen letrozole (FEMARA) 2.5 MG tablet Take 1 tablet (2.5 mg total) by mouth daily.  90 tablet  3  . simvastatin (ZOCOR) 5 MG tablet Take 20 mg by mouth at bedtime.       . triamterene-hydrochlorothiazide (MAXZIDE-25) 37.5-25 MG per tablet Take 1 tablet by mouth every morning.        No current facility-administered  medications on file prior to visit.    No Known Allergies  Family History  Problem Relation Age of Onset  . Diabetes Mother     Deceased in 68s  . Heart disease Mother     cad/MI-fatal  . Breast cancer Neg Hx   . Colon cancer Neg Hx   . Cancer Father     liver, Deceased in 58s  . Heart attack Mother   . Heart disease Sister     History   Social History  . Marital Status: Married    Spouse Name: N/A    Number of Children: 3  . Years of Education: N/A   Occupational History  . 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 Smoking status: Never Smoker   . Smokeless tobacco: Never Used  . Alcohol Use: 6.0 oz/week    10 Glasses of wine per week  . Drug Use: No  . Sexual Activity: None   Other Topics Concern  . None   Social History Narrative   [employer]. Married '59, 3 daughters- '61, '64, '73; 2 grandchildren.Marriage in good health. Exercise - goes to gym 5/wk. ACP - directed to the [email or web address]   Review of Systems - See HPI.  All other ROS are negative.  BP 138/86  Pulse 68  Temp(Src) 98 F (36.7 C) (Oral)  Resp 16  Ht 5\' 2"  (1.575 m)  Wt 170 lb 4 oz (77.225 kg)  BMI 31.13 kg/m2  SpO2 97%  Physical Exam  Vitals reviewed. Constitutional: She is oriented to person, place, and time and well-developed, well-nourished, and in no distress.  HENT:  Head: Normocephalic and atraumatic.  Right Ear: External ear and ear canal normal. Tympanic membrane is erythematous and bulging. Decreased hearing is noted.  Left Ear: Tympanic membrane, external ear and ear canal normal.  Nose: Mucosal edema, rhinorrhea and septal deviation present. Right sinus exhibits no maxillary sinus tenderness and no frontal sinus tenderness. Left sinus exhibits no maxillary sinus tenderness and no frontal sinus tenderness.  Mouth/Throat: Uvula is midline, oropharynx is clear and moist and mucous membranes are normal.  Eyes:  Conjunctivae and EOM are normal. Pupils are equal, round, and reactive to light.  Neck: Neck supple.  Cardiovascular: Normal rate, regular rhythm, normal heart sounds and intact distal pulses.   Pulmonary/Chest: Effort normal and breath sounds normal. No respiratory distress. She has no wheezes. She has no rales. She exhibits no tenderness.  Lymphadenopathy:    She has no cervical adenopathy.  Neurological: She is alert and oriented to person, place, and time.  Skin: Skin is warm and dry. No rash noted.  Psychiatric: Affect normal.   Assessment/Plan: Hyperlipidemia Repeat lipid panel and CMP.  Routine health maintenance TDap given by nursing.  Sheila Schmidt to schedule colonoscopy.  Right otitis media Ear canal irrigated for better visualization of TM.  TM erythematous and bulging.  Rx Amoxicillin.  Encourage Flonase.  If symptoms persist, will need referral to ENT.

## 2013-09-25 NOTE — Assessment & Plan Note (Signed)
TDap given by nursing.  Patient to schedule colonoscopy.

## 2013-09-25 NOTE — Patient Instructions (Signed)
Please obtain labs.  I will call you with your results.  For Ear Infection -- Take antibiotic twice daily as prescribed.  Take with food.  Increase your fluid intake.  Use Flonase daily.  If symptoms do not improve with antibiotic I want you to call the office so that I can set you up with an ENT specialist.    Please follow-up with your OB/GYN as directed.  Also call your Gastroenterologist to set up a Colonoscopy.  Preventive Care for Adults, Female A healthy lifestyle and preventive care can promote health and wellness. Preventive health guidelines for women include the following key practices.  A routine yearly physical is a good way to check with your health care provider about your health and preventive screening. It is a chance to share any concerns and updates on your health and to receive a thorough exam.  Visit your dentist for a routine exam and preventive care every 6 months. Brush your teeth twice a day and floss once a day. Good oral hygiene prevents tooth decay and gum disease.  The frequency of eye exams is based on your age, health, family medical history, use of contact lenses, and other factors. Follow your health care provider's recommendations for frequency of eye exams.  Eat a healthy diet. Foods like vegetables, fruits, whole grains, low-fat dairy products, and lean protein foods contain the nutrients you need without too many calories. Decrease your intake of foods high in solid fats, added sugars, and salt. Eat the right amount of calories for you.Get information about a proper diet from your health care provider, if necessary.  Regular physical exercise is one of the most important things you can do for your health. Most adults should get at least 150 minutes of moderate-intensity exercise (any activity that increases your heart rate and causes you to sweat) each week. In addition, most adults need muscle-strengthening exercises on 2 or more days a week.  Maintain a  healthy weight. The body mass index (BMI) is a screening tool to identify possible weight problems. It provides an estimate of body fat based on height and weight. Your health care provider can find your BMI, and can help you achieve or maintain a healthy weight.For adults 20 years and older:  A BMI below 18.5 is considered underweight.  A BMI of 18.5 to 24.9 is normal.  A BMI of 25 to 29.9 is considered overweight.  A BMI of 30 and above is considered obese.  Maintain normal blood lipids and cholesterol levels by exercising and minimizing your intake of saturated fat. Eat a balanced diet with plenty of fruit and vegetables. Blood tests for lipids and cholesterol should begin at age 19 and be repeated every 5 years. If your lipid or cholesterol levels are high, you are over 50, or you are at high risk for heart disease, you may need your cholesterol levels checked more frequently.Ongoing high lipid and cholesterol levels should be treated with medicines if diet and exercise are not working.  If you smoke, find out from your health care provider how to quit. If you do not use tobacco, do not start.  Lung cancer screening is recommended for adults aged 57 80 years who are at high risk for developing lung cancer because of a history of smoking. A yearly low-dose CT scan of the lungs is recommended for people who have at least a 30-pack-year history of smoking and are a current smoker or have quit within the past 15 years. A  pack year of smoking is smoking an average of 1 pack of cigarettes a day for 1 year (for example: 1 pack a day for 30 years or 2 packs a day for 15 years). Yearly screening should continue until the smoker has stopped smoking for at least 15 years. Yearly screening should be stopped for people who develop a health problem that would prevent them from having lung cancer treatment.  If you are pregnant, do not drink alcohol. If you are breastfeeding, be very cautious about drinking  alcohol. If you are not pregnant and choose to drink alcohol, do not have more than 1 drink per day. One drink is considered to be 12 ounces (355 mL) of beer, 5 ounces (148 mL) of wine, or 1.5 ounces (44 mL) of liquor.  Avoid use of street drugs. Do not share needles with anyone. Ask for help if you need support or instructions about stopping the use of drugs.  High blood pressure causes heart disease and increases the risk of stroke. Your blood pressure should be checked at least every 1 to 2 years. Ongoing high blood pressure should be treated with medicines if weight loss and exercise do not work.  If you are 65 76 years old, ask your health care provider if you should take aspirin to prevent strokes.  Diabetes screening involves taking a blood sample to check your fasting blood sugar level. This should be done once every 3 years, after age 83, if you are within normal weight and without risk factors for diabetes. Testing should be considered at a younger age or be carried out more frequently if you are overweight and have at least 1 risk factor for diabetes.  Breast cancer screening is essential preventive care for women. You should practice "breast self-awareness." This means understanding the normal appearance and feel of your breasts and may include breast self-examination. Any changes detected, no matter how small, should be reported to a health care provider. Women in their 52s and 30s should have a clinical breast exam (CBE) by a health care provider as part of a regular health exam every 1 to 3 years. After age 26, women should have a CBE every year. Starting at age 30, women should consider having a mammogram (breast X-ray test) every year. Women who have a family history of breast cancer should talk to their health care provider about genetic screening. Women at a high risk of breast cancer should talk to their health care providers about having an MRI and a mammogram every year.  Breast  cancer gene (BRCA)-related cancer risk assessment is recommended for women who have family members with BRCA-related cancers. BRCA-related cancers include breast, ovarian, tubal, and peritoneal cancers. Having family members with these cancers may be associated with an increased risk for harmful changes (mutations) in the breast cancer genes BRCA1 and BRCA2. Results of the assessment will determine the need for genetic counseling and BRCA1 and BRCA2 testing.  The Pap test is a screening test for cervical cancer. A Pap test can show cell changes on the cervix that might become cervical cancer if left untreated. A Pap test is a procedure in which cells are obtained and examined from the lower end of the uterus (cervix).  Women should have a Pap test starting at age 23.  Between ages 48 and 68, Pap tests should be repeated every 2 years.  Beginning at age 53, you should have a Pap test every 3 years as long as the past 3  Pap tests have been normal.  Some women have medical problems that increase the chance of getting cervical cancer. Talk to your health care provider about these problems. It is especially important to talk to your health care provider if a new problem develops soon after your last Pap test. In these cases, your health care provider may recommend more frequent screening and Pap tests.  The above recommendations are the same for women who have or have not gotten the vaccine for human papillomavirus (HPV).  If you had a hysterectomy for a problem that was not cancer or a condition that could lead to cancer, then you no longer need Pap tests. Even if you no longer need a Pap test, a regular exam is a good idea to make sure no other problems are starting.  If you are between ages 20 and 68 years, and you have had normal Pap tests going back 10 years, you no longer need Pap tests. Even if you no longer need a Pap test, a regular exam is a good idea to make sure no other problems are  starting.  If you have had past treatment for cervical cancer or a condition that could lead to cancer, you need Pap tests and screening for cancer for at least 20 years after your treatment.  If Pap tests have been discontinued, risk factors (such as a new sexual partner) need to be reassessed to determine if screening should be resumed.  The HPV test is an additional test that may be used for cervical cancer screening. The HPV test looks for the virus that can cause the cell changes on the cervix. The cells collected during the Pap test can be tested for HPV. The HPV test could be used to screen women aged 52 years and older, and should be used in women of any age who have unclear Pap test results. After the age of 76, women should have HPV testing at the same frequency as a Pap test.  Colorectal cancer can be detected and often prevented. Most routine colorectal cancer screening begins at the age of 37 years and continues through age 50 years. However, your health care provider may recommend screening at an earlier age if you have risk factors for colon cancer. On a yearly basis, your health care provider may provide home test kits to check for hidden blood in the stool. Use of a small camera at the end of a tube, to directly examine the colon (sigmoidoscopy or colonoscopy), can detect the earliest forms of colorectal cancer. Talk to your health care provider about this at age 28, when routine screening begins. Direct exam of the colon should be repeated every 5 10 years through age 30 years, unless early forms of pre-cancerous polyps or small growths are found.  People who are at an increased risk for hepatitis B should be screened for this virus. You are considered at high risk for hepatitis B if:  You were born in a country where hepatitis B occurs often. Talk with your health care provider about which countries are considered high risk.  Your parents were born in a high-risk country and you  have not received a shot to protect against hepatitis B (hepatitis B vaccine).  You have HIV or AIDS.  You use needles to inject street drugs.  You live with, or have sex with, someone who has Hepatitis B.  You get hemodialysis treatment.  You take certain medicines for conditions like cancer, organ transplantation, and autoimmune  conditions.  Hepatitis C blood testing is recommended for all people born from [birth year] through 1965 and any individual with known risks for hepatitis C.  Practice safe sex. Use condoms and avoid high-risk sexual practices to reduce the spread of sexually transmitted infections (STIs). STIs include gonorrhea, chlamydia, syphilis, trichomonas, herpes, HPV, and human immunodeficiency virus (HIV). Herpes, HIV, and HPV are viral illnesses that have no cure. They can result in disability, cancer, and death. Sexually active women aged 75 years and younger should be checked for chlamydia. Older women with new or multiple partners should also be tested for chlamydia. Testing for other STIs is recommended if you are sexually active and at increased risk.  Osteoporosis is a disease in which the bones lose minerals and strength with aging. This can result in serious bone fractures or breaks. The risk of osteoporosis can be identified using a bone density scan. Women ages 59 years and over and women at risk for fractures or osteoporosis should discuss screening with their health care providers. Ask your health care provider whether you should take a calcium supplement or vitamin D to reduce the rate of osteoporosis.  Menopause can be associated with physical symptoms and risks. Hormone replacement therapy is available to decrease symptoms and risks. You should talk to your health care provider about whether hormone replacement therapy is right for you.  Use sunscreen. Apply sunscreen liberally and repeatedly throughout the day. You should seek shade when your shadow is shorter than  you. Protect yourself by wearing long sleeves, pants, a wide-brimmed hat, and sunglasses year round, whenever you are outdoors.  Once a month, do a whole body skin exam, using a mirror to look at the skin on your back. Tell your health care provider of new moles, moles that have irregular borders, moles that are larger than a pencil eraser, or moles that have changed in shape or color.  Stay current with required vaccines (immunizations).  Influenza vaccine. All adults should be immunized every year.  Tetanus, diphtheria, and acellular pertussis (Td, Tdap) vaccine. Pregnant women should receive 1 dose of Tdap vaccine during each pregnancy. The dose should be obtained regardless of the length of time since the last dose. Immunization is preferred during the 27th 36th week of gestation. An adult who has not previously received Tdap or who does not know her vaccine status should receive 1 dose of Tdap. This initial dose should be followed by tetanus and diphtheria toxoids (Td) booster doses every 10 years. Adults with an unknown or incomplete history of completing a 3-dose immunization series with Td-containing vaccines should begin or complete a primary immunization series including a Tdap dose. Adults should receive a Td booster every 10 years.  Varicella vaccine. An adult without evidence of immunity to varicella should receive 2 doses or a second dose if she has previously received 1 dose. Pregnant females who do not have evidence of immunity should receive the first dose after pregnancy. This first dose should be obtained before leaving the health care facility. The second dose should be obtained 4 8 weeks after the first dose.  Human papillomavirus (HPV) vaccine. Females aged 75 26 years who have not received the vaccine previously should obtain the 3-dose series. The vaccine is not recommended for use in pregnant females. However, pregnancy testing is not needed before receiving a dose. If a female  is found to be pregnant after receiving a dose, no treatment is needed. In that case, the remaining doses should be delayed  until after the pregnancy. Immunization is recommended for any person with an immunocompromised condition through the age of 80 years if she did not get any or all doses earlier. During the 3-dose series, the second dose should be obtained 4 8 weeks after the first dose. The third dose should be obtained 24 weeks after the first dose and 16 weeks after the second dose.  Zoster vaccine. One dose is recommended for adults aged 69 years or older unless certain conditions are present.  Measles, mumps, and rubella (MMR) vaccine. Adults born before 1 generally are considered immune to measles and mumps. Adults born in [birth year] or later should have 1 or more doses of MMR vaccine unless there is a contraindication to the vaccine or there is laboratory evidence of immunity to each of the three diseases. A routine second dose of MMR vaccine should be obtained at least 28 days after the first dose for students attending postsecondary schools, health care workers, or international travelers. People who received inactivated measles vaccine or an unknown type of measles vaccine during 1963 1967 should receive 2 doses of MMR vaccine. People who received inactivated mumps vaccine or an unknown type of mumps vaccine before 1979 and are at high risk for mumps infection should consider immunization with 2 doses of MMR vaccine. For females of childbearing age, rubella immunity should be determined. If there is no evidence of immunity, females who are not pregnant should be vaccinated. If there is no evidence of immunity, females who are pregnant should delay immunization until after pregnancy. Unvaccinated health care workers born before [birth year] who lack laboratory evidence of measles, mumps, or rubella immunity or laboratory confirmation of disease should consider measles and mumps immunization with 2 doses of  MMR vaccine or rubella immunization with 1 dose of MMR vaccine.  Pneumococcal 13-valent conjugate (PCV13) vaccine. When indicated, a person who is uncertain of her immunization history and has no record of immunization should receive the PCV13 vaccine. An adult aged 66 years or older who has certain medical conditions and has not been previously immunized should receive 1 dose of PCV13 vaccine. This PCV13 should be followed with a dose of pneumococcal polysaccharide (PPSV23) vaccine. The PPSV23 vaccine dose should be obtained at least 8 weeks after the dose of PCV13 vaccine. An adult aged 76 years or older who has certain medical conditions and previously received 1 or more doses of PPSV23 vaccine should receive 1 dose of PCV13. The PCV13 vaccine dose should be obtained 1 or more years after the last PPSV23 vaccine dose.  Pneumococcal polysaccharide (PPSV23) vaccine. When PCV13 is also indicated, PCV13 should be obtained first. All adults aged 73 years and older should be immunized. An adult younger than age 6 years who has certain medical conditions should be immunized. Any person who resides in a nursing home or long-term care facility should be immunized. An adult smoker should be immunized. People with an immunocompromised condition and certain other conditions should receive both PCV13 and PPSV23 vaccines. People with human immunodeficiency virus (HIV) infection should be immunized as soon as possible after diagnosis. Immunization during chemotherapy or radiation therapy should be avoided. Routine use of PPSV23 vaccine is not recommended for American Indians, [place] Natives, or people younger than 65 years unless there are medical conditions that require PPSV23 vaccine. When indicated, people who have unknown immunization and have no record of immunization should receive PPSV23 vaccine. One-time revaccination 5 years after the first dose of PPSV23 is recommended for people aged  66 64 years who have chronic  kidney failure, nephrotic syndrome, asplenia, or immunocompromised conditions. People who received 1 2 doses of PPSV23 before age 81 years should receive another dose of PPSV23 vaccine at age 28 years or later if at least 5 years have passed since the previous dose. Doses of PPSV23 are not needed for people immunized with PPSV23 at or after age 51 years.  Meningococcal vaccine. Adults with asplenia or persistent complement component deficiencies should receive 2 doses of quadrivalent meningococcal conjugate (MenACWY-D) vaccine. The doses should be obtained at least 2 months apart. Microbiologists working with certain meningococcal bacteria, [employer] recruits, people at risk during an outbreak, and people who travel to or live in countries with a high rate of meningitis should be immunized. A first-year college student up through age 77 years who is living in a residence hall should receive a dose if she did not receive a dose on or after her 16th birthday. Adults who have certain high-risk conditions should receive one or more doses of vaccine.  Hepatitis A vaccine. Adults who wish to be protected from this disease, have certain high-risk conditions, work with hepatitis A-infected animals, work in hepatitis A research labs, or travel to or work in countries with a high rate of hepatitis A should be immunized. Adults who were previously unvaccinated and who anticipate close contact with an international adoptee during the first 60 days after arrival in the [place] from a country with a high rate of hepatitis A should be immunized.  Hepatitis B vaccine. Adults who wish to be protected from this disease, have certain high-risk conditions, may be exposed to blood or other infectious body fluids, are household contacts or sex partners of hepatitis B positive people, are clients or workers in certain care facilities, or travel to or work in countries with a high rate of hepatitis B should be  immunized.  Haemophilus influenzae type b (Hib) vaccine. A previously unvaccinated person with asplenia or sickle cell disease or having a scheduled splenectomy should receive 1 dose of Hib vaccine. Regardless of previous immunization, a recipient of a hematopoietic stem cell transplant should receive a 3-dose series 6 12 months after her successful transplant. Hib vaccine is not recommended for adults with HIV infection. Preventive Services / Frequency Ages 11 to 39years  Blood pressure check.** / Every 1 to 2 years.  Lipid and cholesterol check.** / Every 5 years beginning at age 74.  Clinical breast exam.** / Every 3 years for women in their 48s and 57s.  BRCA-related cancer risk assessment.** / For women who have family members with a BRCA-related cancer (breast, ovarian, tubal, or peritoneal cancers).  Pap test.** / Every 2 years from ages 48 through 8. Every 3 years starting at age 25 through age 36 or 7 with a history of 3 consecutive normal Pap tests.  HPV screening.** / Every 3 years from ages 92 through ages 4 to 39 with a history of 3 consecutive normal Pap tests.  Hepatitis C blood test.** / For any individual with known risks for hepatitis C.  Skin self-exam. / Monthly.  Influenza vaccine. / Every year.  Tetanus, diphtheria, and acellular pertussis (Tdap, Td) vaccine.** / Consult your health care provider. Pregnant women should receive 1 dose of Tdap vaccine during each pregnancy. 1 dose of Td every 10 years.  Varicella vaccine.** / Consult your health care provider. Pregnant females who do not have evidence of immunity should receive the first dose after pregnancy.  HPV  vaccine. / 3 doses over 6 months, if 26 and younger. The vaccine is not recommended for use in pregnant females. However, pregnancy testing is not needed before receiving a dose.  Measles, mumps, rubella (MMR) vaccine.** / You need at least 1 dose of MMR if you were born in 1957 or later. You may also  need a 2nd dose. For females of childbearing age, rubella immunity should be determined. If there is no evidence of immunity, females who are not pregnant should be vaccinated. If there is no evidence of immunity, females who are pregnant should delay immunization until after pregnancy.  Pneumococcal 13-valent conjugate (PCV13) vaccine.** / Consult your health care provider.  Pneumococcal polysaccharide (PPSV23) vaccine.** / 1 to 2 doses if you smoke cigarettes or if you have certain conditions.  Meningococcal vaccine.** / 1 dose if you are age 68 to 36 years and a Orthoptist living in a residence hall, or have one of several medical conditions, you need to get vaccinated against meningococcal disease. You may also need additional booster doses.  Hepatitis A vaccine.** / Consult your health care provider.  Hepatitis B vaccine.** / Consult your health care provider.  Haemophilus influenzae type b (Hib) vaccine.** / Consult your health care provider. Ages 58 to 64years  Blood pressure check.** / Every 1 to 2 years.  Lipid and cholesterol check.** / Every 5 years beginning at age 3 years.  Lung cancer screening. / Every year if you are aged 24 80 years and have a 30-pack-year history of smoking and currently smoke or have quit within the past 15 years. Yearly screening is stopped once you have quit smoking for at least 15 years or develop a health problem that would prevent you from having lung cancer treatment.  Clinical breast exam.** / Every year after age 41 years.  BRCA-related cancer risk assessment.** / For women who have family members with a BRCA-related cancer (breast, ovarian, tubal, or peritoneal cancers).  Mammogram.** / Every year beginning at age 43 years and continuing for as long as you are in good health. Consult with your health care provider.  Pap test.** / Every 3 years starting at age 28 years through age 35 or 65 years with a history of 3 consecutive  normal Pap tests.  HPV screening.** / Every 3 years from ages 42 years through ages 71 to 5 years with a history of 3 consecutive normal Pap tests.  Fecal occult blood test (FOBT) of stool. / Every year beginning at age 67 years and continuing until age 77 years. You may not need to do this test if you get a colonoscopy every 10 years.  Flexible sigmoidoscopy or colonoscopy.** / Every 5 years for a flexible sigmoidoscopy or every 10 years for a colonoscopy beginning at age 24 years and continuing until age 21 years.  Hepatitis C blood test.** / For all people born from [birth year] through 1963 and any individual with known risks for hepatitis C.  Skin self-exam. / Monthly.  Influenza vaccine. / Every year.  Tetanus, diphtheria, and acellular pertussis (Tdap/Td) vaccine.** / Consult your health care provider. Pregnant women should receive 1 dose of Tdap vaccine during each pregnancy. 1 dose of Td every 10 years.  Varicella vaccine.** / Consult your health care provider. Pregnant females who do not have evidence of immunity should receive the first dose after pregnancy.  Zoster vaccine.** / 1 dose for adults aged 78 years or older.  Measles, mumps, rubella (MMR) vaccine.** / You need  at least 1 dose of MMR if you were born in 1957 or later. You may also need a 2nd dose. For females of childbearing age, rubella immunity should be determined. If there is no evidence of immunity, females who are not pregnant should be vaccinated. If there is no evidence of immunity, females who are pregnant should delay immunization until after pregnancy.  Pneumococcal 13-valent conjugate (PCV13) vaccine.** / Consult your health care provider.  Pneumococcal polysaccharide (PPSV23) vaccine.** / 1 to 2 doses if you smoke cigarettes or if you have certain conditions.  Meningococcal vaccine.** / Consult your health care provider.  Hepatitis A vaccine.** / Consult your health care provider.  Hepatitis B vaccine.** /  Consult your health care provider.  Haemophilus influenzae type b (Hib) vaccine.** / Consult your health care provider. Ages 65 years and over  Blood pressure check.** / Every 1 to 2 years.  Lipid and cholesterol check.** / Every 5 years beginning at age 40 years.  Lung cancer screening. / Every year if you are aged 63 80 years and have a 30-pack-year history of smoking and currently smoke or have quit within the past 15 years. Yearly screening is stopped once you have quit smoking for at least 15 years or develop a health problem that would prevent you from having lung cancer treatment.  Clinical breast exam.** / Every year after age 9 years.  BRCA-related cancer risk assessment.** / For women who have family members with a BRCA-related cancer (breast, ovarian, tubal, or peritoneal cancers).  Mammogram.** / Every year beginning at age 47 years and continuing for as long as you are in good health. Consult with your health care provider.  Pap test.** / Every 3 years starting at age 81 years through age 6 or 51 years with 3 consecutive normal Pap tests. Testing can be stopped between 65 and 70 years with 3 consecutive normal Pap tests and no abnormal Pap or HPV tests in the past 10 years.  HPV screening.** / Every 3 years from ages 86 years through ages 24 or 75 years with a history of 3 consecutive normal Pap tests. Testing can be stopped between 65 and 70 years with 3 consecutive normal Pap tests and no abnormal Pap or HPV tests in the past 10 years.  Fecal occult blood test (FOBT) of stool. / Every year beginning at age 2 years and continuing until age 31 years. You may not need to do this test if you get a colonoscopy every 10 years.  Flexible sigmoidoscopy or colonoscopy.** / Every 5 years for a flexible sigmoidoscopy or every 10 years for a colonoscopy beginning at age 45 years and continuing until age 84 years.  Hepatitis C blood test.** / For all people born from [birth year] through 1963  and any individual with known risks for hepatitis C.  Osteoporosis screening.** / A one-time screening for women ages 44 years and over and women at risk for fractures or osteoporosis.  Skin self-exam. / Monthly.  Influenza vaccine. / Every year.  Tetanus, diphtheria, and acellular pertussis (Tdap/Td) vaccine.** / 1 dose of Td every 10 years.  Varicella vaccine.** / Consult your health care provider.  Zoster vaccine.** / 1 dose for adults aged 58 years or older.  Pneumococcal 13-valent conjugate (PCV13) vaccine.** / Consult your health care provider.  Pneumococcal polysaccharide (PPSV23) vaccine.** / 1 dose for all adults aged 12 years and older.  Meningococcal vaccine.** / Consult your health care provider.  Hepatitis A vaccine.** / Consult your health  care provider.  Hepatitis B vaccine.** / Consult your health care provider.  Haemophilus influenzae type b (Hib) vaccine.** / Consult your health care provider. ** Family history and personal history of risk and conditions may change your health care provider's recommendations. Document Released: 07/24/2001 Document Revised: 03/18/2013 Document Reviewed: 10/23/2010 [facility] Patient Information 2014 [place], [place].

## 2013-09-25 NOTE — Assessment & Plan Note (Signed)
Ear canal irrigated for better visualization of TM.  TM erythematous and bulging.  Rx Amoxicillin.  Encourage Flonase.  If symptoms persist, will need referral to ENT.

## 2013-09-26 LAB — URINALYSIS, ROUTINE W REFLEX MICROSCOPIC
BILIRUBIN URINE: NEGATIVE
Glucose, UA: NEGATIVE mg/dL
HGB URINE DIPSTICK: NEGATIVE
KETONES UR: NEGATIVE mg/dL
Leukocytes, UA: NEGATIVE
Nitrite: NEGATIVE
PH: 8 (ref 5.0–8.0)
Protein, ur: NEGATIVE mg/dL
SPECIFIC GRAVITY, URINE: 1.016 (ref 1.005–1.030)
Urobilinogen, UA: 0.2 mg/dL (ref 0.0–1.0)

## 2013-11-12 ENCOUNTER — Other Ambulatory Visit (HOSPITAL_BASED_OUTPATIENT_CLINIC_OR_DEPARTMENT_OTHER): Payer: Medicare Other

## 2013-11-12 DIAGNOSIS — C50119 Malignant neoplasm of central portion of unspecified female breast: Secondary | ICD-10-CM

## 2013-11-12 DIAGNOSIS — D0592 Unspecified type of carcinoma in situ of left breast: Secondary | ICD-10-CM

## 2013-11-12 LAB — CBC WITH DIFFERENTIAL/PLATELET
BASO%: 1.1 % (ref 0.0–2.0)
BASOS ABS: 0.1 10*3/uL (ref 0.0–0.1)
EOS ABS: 0.2 10*3/uL (ref 0.0–0.5)
EOS%: 1.8 % (ref 0.0–7.0)
HCT: 45 % (ref 34.8–46.6)
HEMOGLOBIN: 14.9 g/dL (ref 11.6–15.9)
LYMPH%: 23.7 % (ref 14.0–49.7)
MCH: 33.5 pg (ref 25.1–34.0)
MCHC: 33.2 g/dL (ref 31.5–36.0)
MCV: 100.9 fL (ref 79.5–101.0)
MONO#: 0.9 10*3/uL (ref 0.1–0.9)
MONO%: 9 % (ref 0.0–14.0)
NEUT%: 64.4 % (ref 38.4–76.8)
NEUTROS ABS: 6.1 10*3/uL (ref 1.5–6.5)
PLATELETS: 246 10*3/uL (ref 145–400)
RBC: 4.46 10*6/uL (ref 3.70–5.45)
RDW: 13 % (ref 11.2–14.5)
WBC: 9.5 10*3/uL (ref 3.9–10.3)
lymph#: 2.2 10*3/uL (ref 0.9–3.3)

## 2013-11-12 LAB — COMPREHENSIVE METABOLIC PANEL (CC13)
ALBUMIN: 3.8 g/dL (ref 3.5–5.0)
ALK PHOS: 80 U/L (ref 40–150)
ALT: 13 U/L (ref 0–55)
AST: 13 U/L (ref 5–34)
Anion Gap: 13 mEq/L — ABNORMAL HIGH (ref 3–11)
BILIRUBIN TOTAL: 0.93 mg/dL (ref 0.20–1.20)
BUN: 10.8 mg/dL (ref 7.0–26.0)
CO2: 23 mEq/L (ref 22–29)
CREATININE: 0.7 mg/dL (ref 0.6–1.1)
Calcium: 9.6 mg/dL (ref 8.4–10.4)
Chloride: 108 mEq/L (ref 98–109)
GLUCOSE: 98 mg/dL (ref 70–140)
Potassium: 4.5 mEq/L (ref 3.5–5.1)
Sodium: 144 mEq/L (ref 136–145)
Total Protein: 7.3 g/dL (ref 6.4–8.3)

## 2013-11-19 ENCOUNTER — Ambulatory Visit (HOSPITAL_BASED_OUTPATIENT_CLINIC_OR_DEPARTMENT_OTHER): Payer: Medicare Other | Admitting: Oncology

## 2013-11-19 VITALS — BP 161/94 | HR 73 | Temp 98.5°F | Resp 18 | Ht 62.0 in | Wt 171.0 lb

## 2013-11-19 DIAGNOSIS — Z170 Estrogen receptor positive status [ER+]: Secondary | ICD-10-CM

## 2013-11-19 DIAGNOSIS — D0590 Unspecified type of carcinoma in situ of unspecified breast: Secondary | ICD-10-CM

## 2013-11-19 DIAGNOSIS — C50119 Malignant neoplasm of central portion of unspecified female breast: Secondary | ICD-10-CM

## 2013-11-19 MED ORDER — LETROZOLE 2.5 MG PO TABS: 2.5000 mg | ORAL_TABLET | Freq: Every day | ORAL | Status: DC

## 2013-11-19 NOTE — Progress Notes (Signed)
ID: Sheila Schmidt   DOB: 28-Mar-1936  MR#: [id number]  LHI#:[id number]  PCP:  Sheila Climes, PA-C GYN:  Sheila Helling, MD SU:  Sheila Bouillon, MD Other:      HISTORY OF PRESENT ILLNESS: From the original intake note:  Sheila Schmidt had a screening mammogram at Dr. Mechele Schmidt office at [facility] 02/11/2009, which showed a possible mass in the left breast.  She was referred to [facility] for diagnostic studies and on 02/22/2009, Dr. Chilton Schmidt confirmed the presence of a spiculated mass in the left breast at the 9 o'clock location, which was not palpable by physical exam.  Ultrasound showed a spiculated hypoechoic shadowing mass in the left breast measuring 6 mm corresponding to the screening abnormality.  Biopsy was recommended and performed the same day.  This showed ([id number] and [id number]), a low-grade ductal carcinoma which was ER and PR positive, both at 100%, with a low MIB-1 at 11%.  The tumor did not over-express HER-2 by CISH, with a ratio of 1.27.    The patient was referred to Dr. Luisa Schmidt and on 03/02/2009 bilateral breast MRIs were obtained.  This showed no additional abnormality.  The solitary irregular mass in the left breast measured 9 mm by MRI.  There was no evidence of abnormal adenopathy, and no findings in the right breast.  There was a small intramammary lymph node in the middle third of the outer left breast corresponding to a stable intramammary lymph node dating back all the way to 2007.  With this information, the patient proceeded to definitive left lumpectomy and sentinel node sampling 03/22/2009.  The final pathology there ([id number]) confirmed a 7-mm invasive ductal carcinoma, grade 1, with negative and ample margins.  There was no lymphovascular invasion; 0 of 2 sentinel lymph nodes were involved.   Her subsequent history is as detailed below.  INTERVAL HISTORY: Sheila Schmidt returns today for followup of her left breast cancer. The interval history is unremarkable. She  goes to the gym 5 days a week and she plays golf on Saturdays that the weather is good. Family is doing fine. She is tolerating letrozole with minimal hot flashes and some vaginal dryness issues as the only concerns  REVIEW OF SYSTEMS: A detailed review of systems today was entirely negative  PAST MEDICAL HISTORY: Past Medical History  Diagnosis Date  . Depression   . Hyperlipidemia   . Hx of adenomatous colonic polyps   . Cholelithiasis   . 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 Cancer      Left breast carcinoma in situ    PAST SURGICAL HISTORY: Past Surgical History  Procedure Laterality Date  . Breast lumpectomy  oct '10    Left  . Schmidt arthroscopy      Left '00 Sheila Schmidt/ Right '08  . Cholecystectomy, laparoscopic  '06    Sheila Schmidt  . Lumpectomy- remote      benign  . Total Schmidt arthroplasty  02/05/11    left  . Tooth extraction      FAMILY HISTORY Family History  Problem Relation Age of Onset  . Diabetes Mother     Deceased in 61s  . Heart disease Mother     cad/MI-fatal  . Breast cancer Neg Hx   . Colon cancer Neg Hx   . Cancer Father     liver, Deceased in 32s  . Heart attack Mother   . Heart disease Sister   The patient's father died at  the age of 37 from "liver cancer".  The patient's mother died in her 23s from heart problems.  The patient has 1 sister.  There is no history of breast or ovarian cancer in the family to her knowledge  GYNECOLOGIC HISTORY: She is GX, P3, first pregnancy to term in her early 32s.  She went through the change of life around age 17 and took hormones "for many years" without any complications, to her knowledge.  SOCIAL HISTORY: She worked for [employer] in [employer] in [employer], corporate jobs.  Her husband, Sheila Schmidt, worked for [employer] for many years.  They are both now retired and avid golfers.  Daughter, Sheila Schmidt, lives in [place] and works for [employer].  Daughter, Sheila Schmidt, lives in [place] and has 2 daughters.  Daughter  Sheila Schmidt, lives in [place], [place].  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  The patient attends [employer] in [place].   ADVANCED DIRECTIVES:  HEALTH MAINTENANCE: History  Substance Use Topics  . Smoking status: Never Smoker   . Smokeless tobacco: Never Used  . Alcohol Use: 6.0 oz/week    10 Glasses of wine per week     Colonoscopy: Oct 2009, Dr. Madilyn Schmidt, 1 polyp removed  PAP: Dr. Aldona Schmidt, June 2014  Bone density:  04/24/2011, osteopenia  Lipid panel:  UTD, Dr. Debby Schmidt   No Known Allergies  Current Outpatient Prescriptions  Medication Sig Dispense Refill  . amoxicillin (AMOXIL) 875 MG tablet Take 1 tablet (875 mg total) by mouth 2 (two) times daily.  20 tablet  0  . Cholecalciferol 1000 UNITS tablet Take 1,000 Units by mouth daily.      Marland Kitchen FLUoxetine (PROZAC) 10 MG capsule Take 10 mg by mouth daily.        Marland Kitchen ibuprofen (ADVIL,MOTRIN) 800 MG tablet Take 800 mg by mouth every 8 (eight) hours as needed.        Marland Kitchen letrozole (FEMARA) 2.5 MG tablet Take 1 tablet (2.5 mg total) by mouth daily.  90 tablet  3  . simvastatin (ZOCOR) 5 MG tablet Take 20 mg by mouth at bedtime.       . triamterene-hydrochlorothiazide (MAXZIDE-25) 37.5-25 MG per tablet Take 1 tablet by mouth every morning.        No current facility-administered medications for this visit.    OBJECTIVE: Middle-aged white woman who appears stated age  23 Vitals:   11/19/13 1045  BP: 161/94  Pulse: 73  Temp: 98.5 F (36.9 C)  Resp: 18     Body mass index is 31.27 kg/(m^2).    ECOG FS: 0 Filed Weights   11/19/13 1045  Weight: 171 lb (77.565 kg)    Sclerae unicteric, EOMs intact  Oropharynx clear and moist  No cervical or supraclavicular adenopathy Lungs clear to auscultation bilaterally Heart regular rate and rhythm Abdomen soft, nontender, positive bowel sounds MSK no focal spinal tenderness to palpation Neuro: nonfocal,  well  oriented,  positive  affect Breasts: The right  breast is unremarkable. The left breast is status post lumpectomy and radiation. There is no evidence of local recurrence.  The left axilla is benign.     LAB RESULTS: Lab Results  Component Value Date   WBC 9.5 11/12/2013   NEUTROABS 6.1 11/12/2013   HGB 14.9 11/12/2013   HCT 45.0 11/12/2013   MCV 100.9 11/12/2013   PLT 246 11/12/2013      Chemistry      Component Value Date/Time   NA 144 11/12/2013  0958   NA 141 11/14/2011 1015   K 4.5 11/12/2013 0958   K 4.5 11/14/2011 1015   CL 106 11/13/2012 1100   CL 107 11/14/2011 1015   CO2 23 11/12/2013 0958   CO2 25 11/14/2011 1015   BUN 10.8 11/12/2013 0958   BUN 19 11/14/2011 1015   CREATININE 0.7 11/12/2013 0958   CREATININE 0.90 11/14/2011 1015      Component Value Date/Time   CALCIUM 9.6 11/12/2013 0958   CALCIUM 9.7 11/14/2011 1015   ALKPHOS 80 11/12/2013 0958   ALKPHOS 55 11/14/2011 1015   AST 13 11/12/2013 0958   AST 11 11/14/2011 1015   ALT 13 11/12/2013 0958   ALT 12 11/14/2011 1015   BILITOT 0.93 11/12/2013 0958   BILITOT 0.9 11/14/2011 1015       STUDIES:  EXAM:  DUAL X-RAY ABSORPTIOMETRY (DXA) FOR BONE MINERAL DENSITY  COMPARISON: There has been a statistically significant 5.4%  decrease in BMD in the spine and a statistically significant 4.6%  increase in BMD in the total left hip as compared to 04/24/2011.  FINDINGS:  AP LUMBAR SPINE (L2, L4)  Bone Mineral Density (BMD): 0.910 g/cm2  Young Adult T Score: -1.5  Z Score: 1.0  LEFT FEMUR (NECK)  Bone Mineral Density (BMD): 0.686 g/cm2  Young Adult T Score: -1.5  Z Score: 0.6  ASSESSMENT: Patient's diagnostic category is LOW BONE MASS by WHO  Criteria.    DIGITAL DIAGNOSTIC BILATERAL MAMMOGRAM WITH CAD  COMPARISON: 06/26/2012, 04/24/2011, 03/23/2010  ACR Breast Density Category a: The breast tissue is almost entirely  fatty.  FINDINGS:  Left lumpectomy changes are present. There is no suspicious dominant  mass, nonsurgical architectural distortion, or calcification to  suggest malignancy.   Mammographic images were processed with CAD.  IMPRESSION:  No mammographic evidence of malignancy.  RECOMMENDATION:  Yearly diagnostic mammography is suggested.  I have discussed the findings and recommendations with the patient.  Results were also provided in writing at the conclusion of the  visit. If applicable, a reminder letter will be sent to the patient  regarding the next appointment.  BI-RADS CATEGORY 2: Benign finding(s).  Electronically Signed  By: Ulyess Blossom M.D.  On: 07/07/2013 12:14    ASSESSMENT: 78 y.o. [place] woman   (1)  status post left lumpectomy and sentinel lymph node sampling in October 2010 for a T1B N0, stage IA invasive ductal carcinoma,  grade 1, 100% ER/PR-positive, HER2/neu negative with a low MIB-1 at 11%.    (2)  Decided against radiation,   (3)  on tamoxifen between October 2010 and January 2013 at which time she switched to letrozole.   (4) bone density January 2015 shows osteopenia with a T score of -1.5.   PLAN:  Sheila Schmidt will be completing 5 years of antiestrogen therapy at the end of September. She is ready to "graduate" from followup here. She understands that as far as breast cancer followup is concerned all she will need is yearly mammography and yearly physician breast exam.  I will be glad to see Sheila Schmidt at any point in the future as the need arises, but as of now we are making no further routine followup appointment for her here.  MAGRINAT,GUSTAV C    11/19/2013

## 2013-11-19 NOTE — Addendum Note (Signed)
Addended by: Chauncey Cruel on: 11/19/2013 12:17 PM   Modules accepted: Orders

## 2013-11-24 ENCOUNTER — Other Ambulatory Visit: Payer: Self-pay | Admitting: Obstetrics & Gynecology

## 2013-11-25 LAB — CYTOLOGY - PAP

## 2013-12-08 ENCOUNTER — Telehealth: Payer: Self-pay | Admitting: Physician Assistant

## 2013-12-08 ENCOUNTER — Ambulatory Visit (INDEPENDENT_AMBULATORY_CARE_PROVIDER_SITE_OTHER): Payer: Medicare Other | Admitting: Family

## 2013-12-08 ENCOUNTER — Encounter: Payer: Self-pay | Admitting: Family

## 2013-12-08 VITALS — BP 128/84 | HR 63 | Temp 98.0°F | Resp 16 | Ht 62.0 in | Wt 171.0 lb

## 2013-12-08 DIAGNOSIS — M5430 Sciatica, unspecified side: Secondary | ICD-10-CM | POA: Insufficient documentation

## 2013-12-08 DIAGNOSIS — M5432 Sciatica, left side: Secondary | ICD-10-CM

## 2013-12-08 MED ORDER — MELOXICAM 7.5 MG PO TABS: 7.5000 mg | ORAL_TABLET | Freq: Every day | ORAL | Status: DC

## 2013-12-08 MED ORDER — CYCLOBENZAPRINE HCL 5 MG PO TABS: ORAL_TABLET | ORAL | Status: DC

## 2013-12-08 NOTE — Progress Notes (Signed)
Subjective:    Patient ID: Sheila Schmidt, female    DOB: 11-10-1935, 78 y.o.   MRN: [id number]  HPI  Sheila Schmidt is a 78 yr old female who presents today with chief complaint of hip pain. Pain is located in the left hip. Started during her Thursday AM. Played golf on Sunday.  Pain is constant and radiates down the back of the left leg. Using tylenol and saw her massage therapist. Symptoms improved initially but came back. Trouble sleeping due to the pain. She believes that this occurred many years ago.  Pain radiates down the left buttock down to the foot.    Review of Systems    see HPI  Past Medical History  Diagnosis Date  . Depression   . Hyperlipidemia   . Hx of adenomatous colonic polyps   . Cholelithiasis   . 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 Cancer      Left breast carcinoma in situ    History   Social History  . Marital Status: Married    Spouse Name: N/A    Number of Children: 3  . Years of Education: N/A   Occupational History  . 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 Smoking status: Never Smoker   . Smokeless tobacco: Never Used  . Alcohol Use: 6.0 oz/week    10 Glasses of wine per week  . Drug Use: No  . Sexual Activity: Not on file   Other Topics Concern  . Not on file   Social History Narrative   [employer]. [employer]. Married '59, 3 daughters- '61, '64, '73; 2 grandchildren.Marriage in good health. Exercise - goes to gym 5/wk. ACP - directed to the [email or web address]/.    Past Surgical History  Procedure Laterality Date  . Breast lumpectomy  oct '10    Left  . Knee arthroscopy      Left '00 [place]/ Right '08  . Cholecystectomy, laparoscopic  '06    [place]  . Lumpectomy- remote      benign  . Total knee arthroplasty  02/05/11    left  . Tooth extraction      Family History  Problem Relation Age of Onset  . Diabetes Mother     Deceased in 63s  . Heart disease  Mother     cad/MI-fatal  . Breast cancer Neg Hx   . Colon cancer Neg Hx   . Cancer Father     liver, Deceased in 36s  . Heart attack Mother   . Heart disease Sister     No Known Allergies  Current Outpatient Prescriptions on File Prior to Visit  Medication Sig Dispense Refill  . Cholecalciferol 1000 UNITS tablet Take 1,000 Units by mouth daily.      Marland Kitchen FLUoxetine (PROZAC) 10 MG capsule Take 10 mg by mouth daily.        Marland Kitchen ibuprofen (ADVIL,MOTRIN) 800 MG tablet Take 800 mg by mouth every 8 (eight) hours as needed.        . simvastatin (ZOCOR) 5 MG tablet Take 20 mg by mouth at bedtime.       . triamterene-hydrochlorothiazide (MAXZIDE-25) 37.5-25 MG per tablet Take 1 tablet by mouth daily as needed.        No current facility-administered medications on file prior to visit.    BP 128/84  Pulse 63  Temp(Src) 98 F (36.7 C) (  Oral)  Resp 16  Ht 5\' 2"  (1.575 m)  Wt 171 lb (77.565 kg)  BMI 31.27 kg/m2  SpO2 97%    Objective:   Physical Exam  Constitutional: She is oriented to person, place, and time. She appears well-developed and well-nourished. No distress.  HENT:  Head: Normocephalic and atraumatic.  Cardiovascular: Normal rate and regular rhythm.   No murmur heard. Pulmonary/Chest: Effort normal and breath sounds normal. No respiratory distress. She has no wheezes. She has no rales. She exhibits no tenderness.  Musculoskeletal: She exhibits no edema.  Neurological: She is alert and oriented to person, place, and time.  5/5 bilateral LE strength 2+ R patellar reflex.  L patellar reflex absent (s/p L TKR)  Psychiatric: She has a normal mood and affect. Her behavior is normal. Judgment and thought content normal.          Assessment & Plan:

## 2013-12-08 NOTE — Patient Instructions (Signed)
Sciatica Sciatica is pain, weakness, numbness, or tingling along the path of the sciatic nerve. The nerve starts in the lower back and runs down the back of each leg. The nerve controls the muscles in the lower leg and in the back of the knee, while also providing sensation to the back of the thigh, lower leg, and the sole of your foot. Sciatica is a symptom of another medical condition. For instance, nerve damage or certain conditions, such as a herniated disk or bone spur on the spine, pinch or put pressure on the sciatic nerve. This causes the pain, weakness, or other sensations normally associated with sciatica. Generally, sciatica only affects one side of the body. CAUSES   Herniated or slipped disc.  Degenerative disk disease.  A pain disorder involving the narrow muscle in the buttocks (piriformis syndrome).  Pelvic injury or fracture.  Pregnancy.  Tumor (rare). SYMPTOMS  Symptoms can vary from mild to very severe. The symptoms usually travel from the low back to the buttocks and down the back of the leg. Symptoms can include:  Mild tingling or dull aches in the lower back, leg, or hip.  Numbness in the back of the calf or sole of the foot.  Burning sensations in the lower back, leg, or hip.  Sharp pains in the lower back, leg, or hip.  Leg weakness.  Severe back pain inhibiting movement. These symptoms may get worse with coughing, sneezing, laughing, or prolonged sitting or standing. Also, being overweight may worsen symptoms. DIAGNOSIS  Your caregiver will perform a physical exam to look for common symptoms of sciatica. He or she may ask you to do certain movements or activities that would trigger sciatic nerve pain. Other tests may be performed to find the cause of the sciatica. These may include:  Blood tests.  X-rays.  Imaging tests, such as an MRI or CT scan. TREATMENT  Treatment is directed at the cause of the sciatic pain. Sometimes, treatment is not necessary  and the pain and discomfort goes away on its own. If treatment is needed, your caregiver may suggest:  Over-the-counter medicines to relieve pain.  Prescription medicines, such as anti-inflammatory medicine, muscle relaxants, or narcotics.  Applying heat or ice to the painful area.  Steroid injections to lessen pain, irritation, and inflammation around the nerve.  Reducing activity during periods of pain.  Exercising and stretching to strengthen your abdomen and improve flexibility of your spine. Your caregiver may suggest losing weight if the extra weight makes the back pain worse.  Physical therapy.  Surgery to eliminate what is pressing or pinching the nerve, such as a bone spur or part of a herniated disk. HOME CARE INSTRUCTIONS   Only take over-the-counter or prescription medicines for pain or discomfort as directed by your caregiver.  Apply ice to the affected area for 20 minutes, 3-4 times a day for the first 48-72 hours. Then try heat in the same way.  Exercise, stretch, or perform your usual activities if these do not aggravate your pain.  Attend physical therapy sessions as directed by your caregiver.  Keep all follow-up appointments as directed by your caregiver.  Do not wear high heels or shoes that do not provide proper support.  Check your mattress to see if it is too soft. A firm mattress may lessen your pain and discomfort. SEEK IMMEDIATE MEDICAL CARE IF:   You lose control of your bowel or bladder (incontinence).  You have increasing weakness in the lower back, pelvis, buttocks,   or legs.  You have redness or swelling of your back.  You have a burning sensation when you urinate.  You have pain that gets worse when you lie down or awakens you at night.  Your pain is worse than you have experienced in the past.  Your pain is lasting longer than 4 weeks.  You are suddenly losing weight without reason. MAKE SURE YOU:  Understand these  instructions.  Will watch your condition.  Will get help right away if you are not doing well or get worse. Document Released: 05/22/2001 Document Revised: 11/27/2011 Document Reviewed: 10/07/2011 Exit[employer] Patient Information 2015 [employer]. This information is not intended to replace advice given to you by your health care Sheila Schmidt. Make sure you discuss any questions you have with your health care Sheila Schmidt.  

## 2013-12-08 NOTE — Telephone Encounter (Signed)
Received paperwork for PA on cyclobenzaprine, forward to nurse

## 2013-12-08 NOTE — Progress Notes (Signed)
Pre visit review using our clinic review tool, if applicable. No additional management support is needed unless otherwise documented below in the visit note. 

## 2013-12-08 NOTE — Assessment & Plan Note (Signed)
New. Trial of meloxicam, HS flexeril.  She is instructed not to take ibuprofen while using mobic. Call if symptoms worsen or if symptoms are not improved in 1 week.

## 2013-12-09 NOTE — Telephone Encounter (Signed)
Not prescribed by PCP; forwarded back to [facility] [Jennifer] for correction/SLS

## 2013-12-14 ENCOUNTER — Ambulatory Visit: Payer: Medicare Other | Admitting: Family

## 2013-12-14 ENCOUNTER — Telehealth: Payer: Self-pay | Admitting: *Deleted

## 2013-12-14 NOTE — Telephone Encounter (Signed)
Received fax from [employer] for PA for cyclobenzaprine. States drug is not covered on plan, no formulary alternatives listed on fax.  Form forwarded to Provider for completion/signature.

## 2013-12-15 NOTE — Telephone Encounter (Signed)
I would recommend that she consider paying out of pocket as we have only prescribed a few pills and this is short term. Otherwise, just use mobic and let us know if symptoms do not improve.

## 2013-12-16 NOTE — Telephone Encounter (Signed)
Left message for pt to return my call.

## 2013-12-17 NOTE — Telephone Encounter (Signed)
Patient returned phone call. Best # [phone]

## 2013-12-18 NOTE — Telephone Encounter (Signed)
Notified pt and she states that the mobic has helped and she doesn't feel she needs the cyclobenzaprine at this time but voices understanding re: insurance coverage for medication.

## 2014-02-24 ENCOUNTER — Encounter: Payer: Self-pay | Admitting: Physician Assistant

## 2014-02-24 ENCOUNTER — Ambulatory Visit (INDEPENDENT_AMBULATORY_CARE_PROVIDER_SITE_OTHER): Payer: Medicare Other | Admitting: Physician Assistant

## 2014-02-24 VITALS — BP 150/74 | HR 69 | Temp 98.4°F | Resp 16 | Ht 62.0 in | Wt 172.5 lb

## 2014-02-24 DIAGNOSIS — L821 Other seborrheic keratosis: Secondary | ICD-10-CM

## 2014-02-24 DIAGNOSIS — L723 Sebaceous cyst: Secondary | ICD-10-CM

## 2014-02-24 DIAGNOSIS — L720 Epidermal cyst: Secondary | ICD-10-CM | POA: Insufficient documentation

## 2014-02-24 DIAGNOSIS — Z23 Encounter for immunization: Secondary | ICD-10-CM

## 2014-02-24 DIAGNOSIS — I10 Essential (primary) hypertension: Secondary | ICD-10-CM

## 2014-02-24 NOTE — Assessment & Plan Note (Signed)
Flu shot given by nursing staff. 

## 2014-02-24 NOTE — Progress Notes (Signed)
Pre visit review using our clinic review tool, if applicable. No additional management support is needed unless otherwise documented below in the visit note/SLS  

## 2014-02-24 NOTE — Assessment & Plan Note (Signed)
BP elevated in clinic today.  Asymptomatic. Patient has not taken her Maxxide today.  Encouraged patient to use medication daily as directed.  Limit salt intake.  Will routinely monitor BP at visits.

## 2014-02-24 NOTE — Progress Notes (Signed)
Patient presents to clinic today c/o two skin lesions.  The first lesion is of the face just superior to the left buccal region.  Has been present for 1 week. Patient describes lesion as brownish growth of face.  Denies tenderness, erythema, drainage or pruritus.  Denies trauma to the face or skin.  The second lesion of concern in located on the lower left shin.  Has been present for 3-4 weeks. Patient endorses this lesion is purple in color.  Denies pain, erythema, drainage or pruritus. Sometimes get "nicked" when shaving legs.  Denies change in lesion since discovery.  Past Medical History  Diagnosis Date  . Depression   . Hyperlipidemia   . Hx of adenomatous colonic polyps   . Cholelithiasis   . 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 Cancer      Left breast carcinoma in situ    Current Outpatient Prescriptions on File Prior to Visit  Medication Sig Dispense Refill  . Cholecalciferol 1000 UNITS tablet Take 1,000 Units by mouth daily.      Marland Kitchen FLUoxetine (PROZAC) 10 MG capsule Take 10 mg by mouth daily.        Marland Kitchen ibuprofen (ADVIL,MOTRIN) 800 MG tablet Take 800 mg by mouth every 8 (eight) hours as needed.        Marland Kitchen letrozole (FEMARA) 2.5 MG tablet Take 2.5 mg by mouth daily. Will complete in September.      . simvastatin (ZOCOR) 5 MG tablet Take 20 mg by mouth at bedtime.       . triamterene-hydrochlorothiazide (MAXZIDE-25) 37.5-25 MG per tablet Take 1 tablet by mouth daily as needed.        No current facility-administered medications on file prior to visit.    No Known Allergies  Family History  Problem Relation Age of Onset  . Diabetes Mother     Deceased in 44s  . Heart disease Mother     cad/MI-fatal  . Breast cancer Neg Hx   . Colon cancer Neg Hx   . Cancer Father     liver, Deceased in 17s  . Heart attack Mother   . Heart disease Sister     History   Social History  . Marital Status: Married    Spouse Name: N/A    Number of Children: 3  .  Years of Education: N/A   Occupational History  . 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 Smoking status: Never Smoker   . Smokeless tobacco: Never Used  . Alcohol Use: 6.0 oz/week    10 Glasses of wine per week  . Drug Use: No  . Sexual Activity: None   Other Topics Concern  . None   Social History Narrative   [employer]. HSG. Married '59, 3 daughters- '61, '64, '73; 2 grandchildren.Marriage in good health. Exercise - goes to gym 5/wk. ACP - directed to the [email or web address]   Review of Systems - See HPI.  All other ROS are negative.  BP 150/74  Pulse 69  Temp(Src) 98.4 F (36.9 C) (Oral)  Resp 16  Ht 5\' 2"  (1.575 m)  Wt 172 lb 8 oz (78.245 kg)  BMI 31.54 kg/m2  SpO2 97%  Physical Exam  Vitals reviewed. Constitutional: She is oriented to person, place, and time and well-developed, well-nourished, and in no distress.  HENT:  Head: Normocephalic and atraumatic.  Eyes: Conjunctivae are normal. Pupils  are equal, round, and reactive to light.  Neck: Neck supple.  Cardiovascular: Normal rate, regular rhythm, normal heart sounds and intact distal pulses.   Pulmonary/Chest: Effort normal and breath sounds normal. No respiratory distress. She has no wheezes. She has no rales. She exhibits no tenderness.  Neurological: She is alert and oriented to person, place, and time.  Skin: Skin is warm and dry.      Assessment/Plan: Seborrheic keratosis Discussed with patient that this is a benign lesion.  Only to be removed for cosmetic concerns as lesion is not dangerous or currently causing irritation.  No further treatment warranted at present.  Epidermal cyst Discussed benign nature of this type of skin abnormality.  No need for removal at present unless for cosmetic concerns. Encouraged patient of course to monitor lesion for changes in appearance.  If this occurs, patient to return to clinic for biopsy or removal.   Need  for prophylactic vaccination and inoculation against influenza Flu shot given by nursing staff.  Essential hypertension, benign BP elevated in clinic today.  Asymptomatic. Patient has not taken her Maxxide today.  Encouraged patient to use medication daily as directed.  Limit salt intake.  Will routinely monitor BP at visits.

## 2014-02-24 NOTE — Patient Instructions (Signed)
The lesions you are concerned about today are benign.  The lesion on your face is consistent with a developing Seborrheic Keratosis.  The lesion on your leg seems consistent with a benign cyst.  Please read information below on these diagnoses.  Keep an eye on them.  If you notice any major changes to size, shape or the borders of these lesions, please call or come see [place] again in office.  As always, it is a pleasure participating in your care.  Seborrheic Keratosis Seborrheic keratosis is a common, noncancerous (benign) skin growth that can occur anywhere on the skin.It looks like "stuck-on," waxy, rough, tan, brown, or black spots on the skin. These skin growths can be flat or raised.They are often called "barnacles" because of their pasted-on appearance.Usually, these skin growths appear in adulthood, around age 57, and increase in number as you age. They may also develop during pregnancy or following estrogen therapy. Many people may only have one growth appear in their lifetime, while some people may develop many growths. CAUSES It is unknown what causes these skin growths, but they appear to run in families. SYMPTOMS Seborrheic keratosis is often located on the face, chest, shoulders, back, or other areas. These growths are:  Usually painless, but may become irritated and itchy.  Yellow, brown, black, or other colors.  Slightly raised or have a flat surface.  Sometimes rough or wart-like in texture.  Often waxy on the surface.  Round or oval-shaped.  Sometimes "stuck-on" in appearance.  Sometimes single, but there are usually many growths. Any growth that bleeds, itches on a regular basis, becomes inflamed, or becomes irritated needs to be evaluated by a skin specialist (dermatologist). DIAGNOSIS Diagnosis is mainly based on the way the growths appear. In some cases, it can be difficult to tell this type of skin growth from skin cancer. A skin growth tissue sample (biopsy) may be  used to confirm the diagnosis. TREATMENT Most often, treatment is not needed because the skin growths are benign.If the skin growth is irritated easily by clothing or jewelry, causing it to scab or bleed, treatment may be recommended. Patients may also choose to have the growths removed because they do not like their appearance. Most commonly, these growths are treated with cryosurgery. In cryosurgery, liquid nitrogen is applied to "freeze" the growth. The growth usually falls off within a matter of days. A blister may form and dry into a scab that will also fall off. After the growth or scab falls off, it may leave a dark or light spot on the skin. This color may fade over time, or it may remain permanent on the skin. HOME CARE INSTRUCTIONS If the skin growths are treated with cryosurgery, the treated area needs to be kept clean with water and soap. SEEK MEDICAL CARE IF:  You have questions about these growths or other skin problems.  You develop new symptoms, including:  A change in the appearance of the skin growth.  New growths.  Any bleeding, itching, or pain in the growths.  A skin growth that looks similar to seborrheic keratosis. Document Released: 06/30/2010 Document Revised: 08/20/2011 Document Reviewed: 06/30/2010 [facility] Patient Information 2015 [place], [place]. This information is not intended to replace advice given to you by your health care provider. Make sure you discuss any questions you have with your health care provider.  Epidermal Cyst An epidermal cyst is usually a small, painless lump under the skin. Cysts often occur on the face, neck, stomach, chest, or genitals.  The cyst may be filled with a bad smelling paste. Do not pop your cyst. Popping the cyst can cause pain and puffiness (swelling). HOME CARE   Only take medicines as told by your doctor.  Take your medicine (antibiotics) as told. Finish it even if you start to feel better. GET HELP RIGHT AWAY  IF:  Your cyst is tender, red, or puffy.  You are not getting better, or you are getting worse.  You have any questions or concerns. MAKE SURE YOU:  Understand these instructions.  Will watch your condition.  Will get help right away if you are not doing well or get worse. Document Released: 07/05/2004 Document Revised: 11/27/2011 Document Reviewed: 12/04/2010 [facility] Patient Information 2015 [place], [place]. This information is not intended to replace advice given to you by your health care provider. Make sure you discuss any questions you have with your health care provider.

## 2014-02-24 NOTE — Assessment & Plan Note (Signed)
Discussed with patient that this is a benign lesion.  Only to be removed for cosmetic concerns as lesion is not dangerous or currently causing irritation.  No further treatment warranted at present.

## 2014-02-24 NOTE — Assessment & Plan Note (Signed)
Discussed benign nature of this type of skin abnormality.  No need for removal at present unless for cosmetic concerns. Encouraged patient of course to monitor lesion for changes in appearance.  If this occurs, patient to return to clinic for biopsy or removal.

## 2014-03-22 ENCOUNTER — Encounter: Payer: Self-pay | Admitting: Physician Assistant

## 2014-03-22 ENCOUNTER — Ambulatory Visit (INDEPENDENT_AMBULATORY_CARE_PROVIDER_SITE_OTHER): Payer: Medicare Other | Admitting: Physician Assistant

## 2014-03-22 VITALS — BP 142/65 | HR 66 | Temp 98.1°F | Resp 16 | Ht 62.0 in | Wt 171.5 lb

## 2014-03-22 DIAGNOSIS — I10 Essential (primary) hypertension: Secondary | ICD-10-CM

## 2014-03-22 NOTE — Assessment & Plan Note (Signed)
Will attempt 62-month trial of TLC and DASH diet due to SE with medications.  Monitor BP at home.  Follow-up in 1 month. Sooner if BP begins creeping up.

## 2014-03-22 NOTE — Patient Instructions (Signed)
We will attempt a 1 month trial of lifestyle interventions to control blood pressure and prevent leg swelling.  Limiting salt intake is crucial in this. Please read the information below on dietary changes to make to help keep your BP good.  Check your blood pressure a few times each week and write these down.  Bring to follow-up visit in 1 month.  If you notice BP begin to creep up, please call or return sooner.  DASH Eating Plan DASH stands for "Dietary Approaches to Stop Hypertension." The DASH eating plan is a healthy eating plan that has been shown to reduce high blood pressure (hypertension). Additional health benefits may include reducing the risk of type 2 diabetes mellitus, heart disease, and stroke. The DASH eating plan may also help with weight loss. WHAT DO I NEED TO KNOW ABOUT THE DASH EATING PLAN? For the DASH eating plan, you will follow these general guidelines:  Choose foods with a percent daily value for sodium of less than 5% (as listed on the food label).  Use salt-free seasonings or herbs instead of table salt or sea salt.  Check with your health care provider or pharmacist before using salt substitutes.  Eat lower-sodium products, often labeled as "lower sodium" or "no salt added."  Eat fresh foods.  Eat more vegetables, fruits, and low-fat dairy products.  Choose whole grains. Look for the word "whole" as the first word in the ingredient list.  Choose fish and skinless chicken or [place] more often than red meat. Limit fish, poultry, and meat to 6 oz (170 g) each day.  Limit sweets, desserts, sugars, and sugary drinks.  Choose heart-healthy fats.  Limit cheese to 1 oz (28 g) per day.  Eat more home-cooked food and less restaurant, buffet, and fast food.  Limit fried foods.  Cook foods using methods other than frying.  Limit canned vegetables. If you do use them, rinse them well to decrease the sodium.  When eating at a restaurant, ask that your food be  prepared with less salt, or no salt if possible. WHAT FOODS CAN I EAT? Seek help from a dietitian for individual calorie needs. Grains Whole grain or whole wheat bread. Brown rice. Whole grain or whole wheat pasta. Quinoa, bulgur, and whole grain cereals. Low-sodium cereals. Corn or whole wheat flour tortillas. Whole grain cornbread. Whole grain crackers. Low-sodium crackers. Vegetables Fresh or frozen vegetables (raw, steamed, roasted, or grilled). Low-sodium or reduced-sodium tomato and vegetable juices. Low-sodium or reduced-sodium tomato sauce and paste. Low-sodium or reduced-sodium canned vegetables.  Fruits All fresh, canned (in natural juice), or frozen fruits. Meat and Other Protein Products Ground beef (85% or leaner), grass-fed beef, or beef trimmed of fat. Skinless chicken or [place]. Ground chicken or [place]. Pork trimmed of fat. All fish and seafood. Eggs. Dried beans, peas, or lentils. Unsalted nuts and seeds. Unsalted canned beans. Dairy Low-fat dairy products, such as skim or 1% milk, 2% or reduced-fat cheeses, low-fat ricotta or cottage cheese, or plain low-fat yogurt. Low-sodium or reduced-sodium cheeses. Fats and Oils Tub margarines without trans fats. Light or reduced-fat mayonnaise and salad dressings (reduced sodium). Avocado. Safflower, olive, or canola oils. Natural peanut or almond butter. Other Unsalted popcorn and pretzels. The items listed above may not be a complete list of recommended foods or beverages. Contact your dietitian for more options. WHAT FOODS ARE NOT RECOMMENDED? Grains White bread. White pasta. White rice. Refined cornbread. Bagels and croissants. Crackers that contain trans fat. Vegetables Creamed or fried vegetables.  Vegetables in a cheese sauce. Regular canned vegetables. Regular canned tomato sauce and paste. Regular tomato and vegetable juices. Fruits Dried fruits. Canned fruit in light or heavy syrup. Fruit juice. Meat and Other Protein  Products Fatty cuts of meat. Ribs, chicken wings, bacon, sausage, bologna, salami, chitterlings, fatback, hot dogs, bratwurst, and packaged luncheon meats. Salted nuts and seeds. Canned beans with salt. Dairy Whole or 2% milk, cream, half-and-half, and cream cheese. Whole-fat or sweetened yogurt. Full-fat cheeses or blue cheese. Nondairy creamers and whipped toppings. Processed cheese, cheese spreads, or cheese curds. Condiments Onion and garlic salt, seasoned salt, table salt, and sea salt. Canned and packaged gravies. Worcestershire sauce. Tartar sauce. Barbecue sauce. Teriyaki sauce. Soy sauce, including reduced sodium. Steak sauce. Fish sauce. Oyster sauce. Cocktail sauce. Horseradish. Ketchup and mustard. Meat flavorings and tenderizers. Bouillon cubes. Hot sauce. Tabasco sauce. Marinades. Taco seasonings. Relishes. Fats and Oils Butter, stick margarine, lard, shortening, ghee, and bacon fat. Coconut, palm kernel, or palm oils. Regular salad dressings. Other Pickles and olives. Salted popcorn and pretzels. The items listed above may not be a complete list of foods and beverages to avoid. Contact your dietitian for more information. WHERE CAN I FIND MORE INFORMATION? National Heart, [employer], and [employer] Institute: travelstabloid.com Document Released: 05/17/2011 Document Revised: 10/12/2013 Document Reviewed: 04/01/2013 [facility] Patient Information 2015 [place], [place]. This information is not intended to replace advice given to you by your health care provider. Make sure you discuss any questions you have with your health care provider.

## 2014-03-22 NOTE — Progress Notes (Signed)
Pre visit review using our clinic review tool, if applicable. No additional management support is needed unless otherwise documented below in the visit note/SLS  

## 2014-03-22 NOTE — Progress Notes (Signed)
Patient presents to clinic today c/o leg cramping and fatigue with Maxxide for peripheral edema and BP.  Has stopped medication.  Symptoms resolved with cessation of medication.  BP 142/65 in clinic.  Asymptomatic.  Past Medical History  Diagnosis Date  . Depression   . Hyperlipidemia   . Hx of adenomatous colonic polyps   . Cholelithiasis   . 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 Cancer      Left breast carcinoma in situ    Current Outpatient Prescriptions on File Prior to Visit  Medication Sig Dispense Refill  . Cholecalciferol 1000 UNITS tablet Take 1,000 Units by mouth daily.      Marland Kitchen FLUoxetine (PROZAC) 10 MG capsule Take 10 mg by mouth daily.        Marland Kitchen ibuprofen (ADVIL,MOTRIN) 800 MG tablet Take 800 mg by mouth every 8 (eight) hours as needed.        Marland Kitchen letrozole (FEMARA) 2.5 MG tablet Take 2.5 mg by mouth daily. Will complete in September.      . simvastatin (ZOCOR) 5 MG tablet Take 20 mg by mouth at bedtime.        No current facility-administered medications on file prior to visit.    No Known Allergies  Family History  Problem Relation Age of Onset  . Diabetes Mother     Deceased in 38s  . Heart disease Mother     cad/MI-fatal  . Breast cancer Neg Hx   . Colon cancer Neg Hx   . Cancer Father     liver, Deceased in 78s  . Heart attack Mother   . Heart disease Sister     History   Social History  . Marital Status: Married    Spouse Name: N/A    Number of Children: 3  . Years of Education: N/A   Occupational History  . 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 Smoking status: Never Smoker   . Smokeless tobacco: Never Used  . Alcohol Use: 6.0 oz/week    10 Glasses of wine per week  . Drug Use: No  . Sexual Activity: None   Other Topics Concern  . None   Social History Narrative   [employer]. HSG. Married '59, 3 daughters- '61, '64, '73; 2 grandchildren.Marriage in good health.  Exercise - goes to gym 5/wk. ACP - directed to the [email or web address]   Review of Systems - See HPI.  All other ROS are negative.  BP 142/65  Pulse 66  Temp(Src) 98.1 F (36.7 C) (Oral)  Resp 16  Ht 5\' 2"  (1.575 m)  Wt 171 lb 8 oz (77.792 kg)  BMI 31.36 kg/m2  SpO2 98%  Physical Exam  Vitals reviewed. Constitutional: She is oriented to person, place, and time and well-developed, well-nourished, and in no distress.  HENT:  Head: Normocephalic and atraumatic.  Eyes: Conjunctivae are normal.  Cardiovascular: Normal rate, regular rhythm, normal heart sounds and intact distal pulses.   Pulses 2+ bilaterally.  No evidence of pedal edema on examination.  Pulmonary/Chest: Effort normal and breath sounds normal. No respiratory distress. She has no wheezes. She has no rales. She exhibits no tenderness.  Neurological: She is alert and oriented to person, place, and time.  Skin: Skin is warm and dry. No rash noted.  Psychiatric: Affect normal.   Assessment/Plan: Essential hypertension, benign Will attempt 75-month trial of TLC and DASH  diet due to SE with medications.  Monitor BP at home.  Follow-up in 1 month. Sooner if BP begins creeping up.

## 2014-04-12 ENCOUNTER — Encounter: Payer: Self-pay | Admitting: Physician Assistant

## 2014-06-08 ENCOUNTER — Ambulatory Visit (INDEPENDENT_AMBULATORY_CARE_PROVIDER_SITE_OTHER): Payer: Medicare Other | Admitting: Physician Assistant

## 2014-06-08 ENCOUNTER — Encounter: Payer: Self-pay | Admitting: Physician Assistant

## 2014-06-08 VITALS — BP 146/84 | HR 64 | Temp 98.0°F | Resp 16 | Ht 62.0 in | Wt 171.2 lb

## 2014-06-08 DIAGNOSIS — I10 Essential (primary) hypertension: Secondary | ICD-10-CM

## 2014-06-08 NOTE — Progress Notes (Signed)
Patient presents to clinic today for BP recheck after attempting trial of TLC.  Patient states she has been going to the gym daily with the exception of Christmas eve and christmas day.  Is trying to watch diet but states that has been hard over the holidays. Endorses checking BP at home and at PT.  Endorses BP ranges from 130-140-80-90.  Denies chest pain, palpitations, LH, dizziness or vision change.  Past Medical History  Diagnosis Date  . Depression   . Hyperlipidemia   . Hx of adenomatous colonic polyps   . Cholelithiasis   . 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 Cancer      Left breast carcinoma in situ    Current Outpatient Prescriptions on File Prior to Visit  Medication Sig Dispense Refill  . Cholecalciferol 1000 UNITS tablet Take 1,000 Units by mouth daily.    Marland Kitchen FLUoxetine (PROZAC) 10 MG capsule Take 10 mg by mouth daily.      Marland Kitchen ibuprofen (ADVIL,MOTRIN) 800 MG tablet Take 800 mg by mouth every 8 (eight) hours as needed.      Marland Kitchen OVER THE COUNTER MEDICATION at bedtime as needed. Sleep Aid     No current facility-administered medications on file prior to visit.    No Known Allergies  Family History  Problem Relation Age of Onset  . Diabetes Mother     Deceased in 90s  . Heart disease Mother     cad/MI-fatal  . Breast cancer Neg Hx   . Colon cancer Neg Hx   . Cancer Father     liver, Deceased in 83s  . Heart attack Mother   . Heart disease Sister     History   Social History  . Marital Status: Married    Spouse Name: N/A    Number of Children: 3  . Years of Education: N/A   Occupational History  . 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 Smoking status: Never Smoker   . Smokeless tobacco: Never Used  . Alcohol Use: 6.0 oz/week    10 Glasses of wine per week  . Drug Use: No  . Sexual Activity: None   Other Topics Concern  . None   Social History Narrative   [employer]. HSG.  Married '59, 3 daughters- '61, '64, '73; 2 grandchildren.Marriage in good health. Exercise - goes to gym 5/wk. ACP - directed to the [email or web address]   Review of Systems - See HPI.  All other ROS are negative.  BP 146/84 mmHg  Pulse 64  Temp(Src) 98 F (36.7 C) (Oral)  Resp 16  Ht 5\' 2"  (1.575 m)  Wt 171 lb 4 oz (77.678 kg)  BMI 31.31 kg/m2  SpO2 97%  Physical Exam  Constitutional: She is oriented to person, place, and time and well-developed, well-nourished, and in no distress.  HENT:  Head: Normocephalic and atraumatic.  Eyes: Conjunctivae are normal.  Cardiovascular: Normal rate, regular rhythm, normal heart sounds and intact distal pulses.   Pulmonary/Chest: Effort normal and breath sounds normal. No respiratory distress. She has no wheezes. She has no rales. She exhibits no tenderness.  Neurological: She is alert and oriented to person, place, and time.  Skin: Skin is warm and dry. No rash noted.  Psychiatric: Affect normal.  Vitals reviewed.  Assessment/Plan: Essential hypertension, benign BP at 140/78 on recheck.  Think it is acceptable to continue TLC with  stricter dietary precautions.  Will routinely monitor BP.  Patient to call if home BP recordings are elevating above 150/90 on a persistent basis.

## 2014-06-08 NOTE — Assessment & Plan Note (Signed)
BP at 140/78 on recheck.  Think it is acceptable to continue TLC with stricter dietary precautions.  Will routinely monitor BP.  Patient to call if home BP recordings are elevating above 150/90 on a persistent basis.

## 2014-06-08 NOTE — Progress Notes (Signed)
Pre visit review using our clinic review tool, if applicable. No additional management support is needed unless otherwise documented below in the visit note/SLS  

## 2014-06-08 NOTE — Patient Instructions (Signed)
Please continue your lifestyle changes.  Really watch that salt intake.  We will monitor BP routinely to make sure it is staying at an acceptable range.    Follow-up in 6 months.  DASH Eating Plan DASH stands for "Dietary Approaches to Stop Hypertension." The DASH eating plan is a healthy eating plan that has been shown to reduce high blood pressure (hypertension). Additional health benefits may include reducing the risk of type 2 diabetes mellitus, heart disease, and stroke. The DASH eating plan may also help with weight loss. WHAT DO I NEED TO KNOW ABOUT THE DASH EATING PLAN? For the DASH eating plan, you will follow these general guidelines:  Choose foods with a percent daily value for sodium of less than 5% (as listed on the food label).  Use salt-free seasonings or herbs instead of table salt or sea salt.  Check with your health care provider or pharmacist before using salt substitutes.  Eat lower-sodium products, often labeled as "lower sodium" or "no salt added."  Eat fresh foods.  Eat more vegetables, fruits, and low-fat dairy products.  Choose whole grains. Look for the word "whole" as the first word in the ingredient list.  Choose fish and skinless chicken or [place] more often than red meat. Limit fish, poultry, and meat to 6 oz (170 g) each day.  Limit sweets, desserts, sugars, and sugary drinks.  Choose heart-healthy fats.  Limit cheese to 1 oz (28 g) per day.  Eat more home-cooked food and less restaurant, buffet, and fast food.  Limit fried foods.  Cook foods using methods other than frying.  Limit canned vegetables. If you do use them, rinse them well to decrease the sodium.  When eating at a restaurant, ask that your food be prepared with less salt, or no salt if possible. WHAT FOODS CAN I EAT? Seek help from a dietitian for individual calorie needs. Grains Whole grain or whole wheat bread. Brown rice. Whole grain or whole wheat pasta. Quinoa, bulgur, and  whole grain cereals. Low-sodium cereals. Corn or whole wheat flour tortillas. Whole grain cornbread. Whole grain crackers. Low-sodium crackers. Vegetables Fresh or frozen vegetables (raw, steamed, roasted, or grilled). Low-sodium or reduced-sodium tomato and vegetable juices. Low-sodium or reduced-sodium tomato sauce and paste. Low-sodium or reduced-sodium canned vegetables.  Fruits All fresh, canned (in natural juice), or frozen fruits. Meat and Other Protein Products Ground beef (85% or leaner), grass-fed beef, or beef trimmed of fat. Skinless chicken or [place]. Ground chicken or [place]. Pork trimmed of fat. All fish and seafood. Eggs. Dried beans, peas, or lentils. Unsalted nuts and seeds. Unsalted canned beans. Dairy Low-fat dairy products, such as skim or 1% milk, 2% or reduced-fat cheeses, low-fat ricotta or cottage cheese, or plain low-fat yogurt. Low-sodium or reduced-sodium cheeses. Fats and Oils Tub margarines without trans fats. Light or reduced-fat mayonnaise and salad dressings (reduced sodium). Avocado. Safflower, olive, or canola oils. Natural peanut or almond butter. Other Unsalted popcorn and pretzels. The items listed above may not be a complete list of recommended foods or beverages. Contact your dietitian for more options. WHAT FOODS ARE NOT RECOMMENDED? Grains White bread. White pasta. White rice. Refined cornbread. Bagels and croissants. Crackers that contain trans fat. Vegetables Creamed or fried vegetables. Vegetables in a cheese sauce. Regular canned vegetables. Regular canned tomato sauce and paste. Regular tomato and vegetable juices. Fruits Dried fruits. Canned fruit in light or heavy syrup. Fruit juice. Meat and Other Protein Products Fatty cuts of meat. Ribs, chicken wings, bacon,  sausage, bologna, salami, chitterlings, fatback, hot dogs, bratwurst, and packaged luncheon meats. Salted nuts and seeds. Canned beans with salt. Dairy Whole or 2% milk, cream,  half-and-half, and cream cheese. Whole-fat or sweetened yogurt. Full-fat cheeses or blue cheese. Nondairy creamers and whipped toppings. Processed cheese, cheese spreads, or cheese curds. Condiments Onion and garlic salt, seasoned salt, table salt, and sea salt. Canned and packaged gravies. Worcestershire sauce. Tartar sauce. Barbecue sauce. Teriyaki sauce. Soy sauce, including reduced sodium. Steak sauce. Fish sauce. Oyster sauce. Cocktail sauce. Horseradish. Ketchup and mustard. Meat flavorings and tenderizers. Bouillon cubes. Hot sauce. Tabasco sauce. Marinades. Taco seasonings. Relishes. Fats and Oils Butter, stick margarine, lard, shortening, ghee, and bacon fat. Coconut, palm kernel, or palm oils. Regular salad dressings. Other Pickles and olives. Salted popcorn and pretzels. The items listed above may not be a complete list of foods and beverages to avoid. Contact your dietitian for more information. WHERE CAN I FIND MORE INFORMATION? National Heart, Lung, and Blood Institute: travelstabloid.com Document Released: 05/17/2011 Document Revised: 10/12/2013 Document Reviewed: 04/01/2013 [facility] Patient Information 2015 [place], [place]. This information is not intended to replace advice given to you by your health care provider. Make sure you discuss any questions you have with your health care provider.

## 2014-06-29 ENCOUNTER — Other Ambulatory Visit: Payer: Self-pay | Admitting: Oncology

## 2014-06-29 DIAGNOSIS — Z9889 Other specified postprocedural states: Secondary | ICD-10-CM

## 2014-06-29 DIAGNOSIS — Z853 Personal history of malignant neoplasm of breast: Secondary | ICD-10-CM

## 2014-07-08 ENCOUNTER — Ambulatory Visit
Admission: RE | Admit: 2014-07-08 | Discharge: 2014-07-08 | Disposition: A | Discharge status: Home / Self Care | Payer: Medicare Other | Source: Ambulatory Visit | Attending: Oncology | Admitting: Oncology

## 2014-07-08 DIAGNOSIS — Z853 Personal history of malignant neoplasm of breast: Secondary | ICD-10-CM

## 2014-07-08 DIAGNOSIS — Z9889 Other specified postprocedural states: Secondary | ICD-10-CM

## 2014-12-07 ENCOUNTER — Ambulatory Visit (INDEPENDENT_AMBULATORY_CARE_PROVIDER_SITE_OTHER): Payer: Medicare Other | Admitting: Physician Assistant

## 2014-12-07 ENCOUNTER — Encounter: Payer: Self-pay | Admitting: Physician Assistant

## 2014-12-07 VITALS — BP 138/80 | HR 64 | Temp 98.4°F | Ht 62.0 in | Wt 167.8 lb

## 2014-12-07 DIAGNOSIS — I10 Essential (primary) hypertension: Secondary | ICD-10-CM

## 2014-12-07 NOTE — Assessment & Plan Note (Signed)
BP stable. Asymptomatic. Continue TLC. Will routinely monitor.

## 2014-12-07 NOTE — Progress Notes (Signed)
Patient presents to clinic today for 81-month follow-up of HTN, previously well-controlled with diet and exercise regimen. Patient denies chest pain, palpitations, lightheadedness, dizziness, vision changes or frequent headaches.   BP Readings from Last 3 Encounters:  12/07/14 138/80  06/08/14 146/84  03/22/14 142/65   Past Medical History  Diagnosis Date  . Depression   . Hyperlipidemia   . Hx of adenomatous colonic polyps   . Cholelithiasis   . 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 Cancer      Left breast carcinoma in situ    Current Outpatient Prescriptions on File Prior to Visit  Medication Sig Dispense Refill  . Cholecalciferol 1000 UNITS tablet Take 1,000 Units by mouth daily.    Marland Kitchen FLUoxetine (PROZAC) 10 MG capsule Take 10 mg by mouth daily.      Marland Kitchen ibuprofen (ADVIL,MOTRIN) 800 MG tablet Take 800 mg by mouth every 8 (eight) hours as needed.      Marland Kitchen OVER THE COUNTER MEDICATION at bedtime as needed. Sleep Aid    . simvastatin (ZOCOR) 40 MG tablet Take 40 mg by mouth at bedtime.    . Misc Natural Products (TURMERIC CURCUMIN) CAPS Take 2 capsules by mouth as directed.     No current facility-administered medications on file prior to visit.    No Known Allergies  Family History  Problem Relation Age of Onset  . Diabetes Mother     Deceased in 80s  . Heart disease Mother     cad/MI-fatal  . Breast cancer Neg Hx   . Colon cancer Neg Hx   . Cancer Father     liver, Deceased in 80s  . Heart attack Mother   . Heart disease Sister     History   Social History  . Marital Status: Married    Spouse Name: N/A  . Number of Children: 3  . Years of Education: N/A   Occupational History  . 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 Smoking status: Never Smoker   . Smokeless tobacco: Never Used  . Alcohol Use: 6.0 oz/week    10 Glasses of wine per week  . Drug Use: No  . Sexual Activity: Not  on file   Other Topics Concern  . None   Social History Narrative   [employer]. HSG. Married '59, 3 daughters- '61, '64, '73; 2 grandchildren.Marriage in good health. Exercise - goes to gym 5/wk. ACP - directed to the [email or web address].net/.   Review of Systems - See HPI.  All other ROS are negative.  BP 138/80 mmHg  Pulse 64  Temp(Src) 98.4 F (36.9 C) (Oral)  Ht 5\' 2"  (1.575 m)  Wt 167 lb 12.8 oz (76.114 kg)  BMI 30.68 kg/m2  SpO2 96%  Physical Exam  Constitutional: She is oriented to person, place, and time and well-developed, well-nourished, and in no distress.  HENT:  Head: Normocephalic and atraumatic.  Cardiovascular: Normal rate, regular rhythm, normal heart sounds and intact distal pulses.   Pulmonary/Chest: Effort normal and breath sounds normal. No respiratory distress. She has no wheezes. She has no rales. She exhibits no tenderness.  Neurological: She is alert and oriented to person, place, and time.  Skin: Skin is warm and dry. No rash noted.  Psychiatric: Affect normal.  Vitals reviewed.  Assessment/Plan: Essential hypertension, benign BP stable. Asymptomatic. Continue TLC. Will routinely monitor.

## 2014-12-07 NOTE — Progress Notes (Signed)
Pre visit review using our clinic review tool, if applicable. No additional management support is needed unless otherwise documented below in the visit note. 

## 2014-12-07 NOTE — Patient Instructions (Signed)
Your blood pressure looks good today. Continue the DASH diet (see below).  We will follow-up routinely for physicals.  I will call you once I have received recent lab work from your specialist.  [place] stands for "Dietary Approaches to Stop Hypertension." The DASH eating plan is a healthy eating plan that has been shown to reduce high blood pressure (hypertension). Additional health benefits may include reducing the risk of type 2 diabetes mellitus, heart disease, and stroke. The DASH eating plan may also help with weight loss. WHAT DO I NEED TO KNOW ABOUT THE DASH EATING PLAN? For the DASH eating plan, you will follow these general guidelines:  Choose foods with a percent daily value for sodium of less than 5% (as listed on the food label).  Use salt-free seasonings or herbs instead of table salt or sea salt.  Check with your health care provider or pharmacist before using salt substitutes.  Eat lower-sodium products, often labeled as "lower sodium" or "no salt added."  Eat fresh foods.  Eat more vegetables, fruits, and low-fat dairy products.  Choose whole grains. Look for the word "whole" as the first word in the ingredient list.  Choose fish and skinless chicken or [place] more often than red meat. Limit fish, poultry, and meat to 6 oz (170 g) each day.  Limit sweets, desserts, sugars, and sugary drinks.  Choose heart-healthy fats.  Limit cheese to 1 oz (28 g) per day.  Eat more home-cooked food and less restaurant, buffet, and fast food.  Limit fried foods.  Cook foods using methods other than frying.  Limit canned vegetables. If you do use them, rinse them well to decrease the sodium.  When eating at a restaurant, ask that your food be prepared with less salt, or no salt if possible. WHAT FOODS CAN I EAT? Seek help from a dietitian for individual calorie needs. Grains Whole grain or whole wheat bread. Brown rice. Whole grain or whole wheat pasta. Quinoa,  bulgur, and whole grain cereals. Low-sodium cereals. Corn or whole wheat flour tortillas. Whole grain cornbread. Whole grain crackers. Low-sodium crackers. Vegetables Fresh or frozen vegetables (raw, steamed, roasted, or grilled). Low-sodium or reduced-sodium tomato and vegetable juices. Low-sodium or reduced-sodium tomato sauce and paste. Low-sodium or reduced-sodium canned vegetables.  Fruits All fresh, canned (in natural juice), or frozen fruits. Meat and Other Protein Products Ground beef (85% or leaner), grass-fed beef, or beef trimmed of fat. Skinless chicken or [place]. Ground chicken or [place]. Pork trimmed of fat. All fish and seafood. Eggs. Dried beans, peas, or lentils. Unsalted nuts and seeds. Unsalted canned beans. Dairy Low-fat dairy products, such as skim or 1% milk, 2% or reduced-fat cheeses, low-fat ricotta or cottage cheese, or plain low-fat yogurt. Low-sodium or reduced-sodium cheeses. Fats and Oils Tub margarines without trans fats. Light or reduced-fat mayonnaise and salad dressings (reduced sodium). Avocado. Safflower, olive, or canola oils. Natural peanut or almond butter. Other Unsalted popcorn and pretzels. The items listed above may not be a complete list of recommended foods or beverages. Contact your dietitian for more options. WHAT FOODS ARE NOT RECOMMENDED? Grains White bread. White pasta. White rice. Refined cornbread. Bagels and croissants. Crackers that contain trans fat. Vegetables Creamed or fried vegetables. Vegetables in a cheese sauce. Regular canned vegetables. Regular canned tomato sauce and paste. Regular tomato and vegetable juices. Fruits Dried fruits. Canned fruit in light or heavy syrup. Fruit juice. Meat and Other Protein Products Fatty cuts of meat. Ribs, chicken wings, bacon,  sausage, bologna, salami, chitterlings, fatback, hot dogs, bratwurst, and packaged luncheon meats. Salted nuts and seeds. Canned beans with salt. Dairy Whole or 2% milk,  cream, half-and-half, and cream cheese. Whole-fat or sweetened yogurt. Full-fat cheeses or blue cheese. Nondairy creamers and whipped toppings. Processed cheese, cheese spreads, or cheese curds. Condiments Onion and garlic salt, seasoned salt, table salt, and sea salt. Canned and packaged gravies. Worcestershire sauce. Tartar sauce. Barbecue sauce. Teriyaki sauce. Soy sauce, including reduced sodium. Steak sauce. Fish sauce. Oyster sauce. Cocktail sauce. Horseradish. Ketchup and mustard. Meat flavorings and tenderizers. Bouillon cubes. Hot sauce. Tabasco sauce. Marinades. Taco seasonings. Relishes. Fats and Oils Butter, stick margarine, lard, shortening, ghee, and bacon fat. Coconut, palm kernel, or palm oils. Regular salad dressings. Other Pickles and olives. Salted popcorn and pretzels. The items listed above may not be a complete list of foods and beverages to avoid. Contact your dietitian for more information. WHERE CAN I FIND MORE INFORMATION? National Heart, Lung, and Blood Institute: travelstabloid.com Document Released: 05/17/2011 Document Revised: 10/12/2013 Document Reviewed: 04/01/2013 [facility] Patient Information 2015 [place], [place]. This information is not intended to replace advice given to you by your health care provider. Make sure you discuss any questions you have with your health care provider.

## 2015-03-16 ENCOUNTER — Ambulatory Visit (INDEPENDENT_AMBULATORY_CARE_PROVIDER_SITE_OTHER): Payer: Medicare Other | Admitting: Physician Assistant

## 2015-03-16 ENCOUNTER — Encounter (HOSPITAL_BASED_OUTPATIENT_CLINIC_OR_DEPARTMENT_OTHER): Payer: Self-pay

## 2015-03-16 ENCOUNTER — Encounter: Payer: Self-pay | Admitting: Physician Assistant

## 2015-03-16 ENCOUNTER — Emergency Department (HOSPITAL_BASED_OUTPATIENT_CLINIC_OR_DEPARTMENT_OTHER)
Admission: EM | Admit: 2015-03-16 | Discharge: 2015-03-16 | Disposition: A | Discharge status: Home / Self Care | Payer: Medicare Other | Attending: Emergency Medicine | Admitting: Emergency Medicine

## 2015-03-16 VITALS — BP 132/98 | HR 147 | Temp 98.1°F | Resp 16 | Ht 62.0 in | Wt 173.2 lb

## 2015-03-16 DIAGNOSIS — E785 Hyperlipidemia, unspecified: Secondary | ICD-10-CM | POA: Insufficient documentation

## 2015-03-16 DIAGNOSIS — Z853 Personal history of malignant neoplasm of breast: Secondary | ICD-10-CM | POA: Diagnosis not present

## 2015-03-16 DIAGNOSIS — R000 Tachycardia, unspecified: Secondary | ICD-10-CM

## 2015-03-16 DIAGNOSIS — I4892 Unspecified atrial flutter: Secondary | ICD-10-CM | POA: Insufficient documentation

## 2015-03-16 DIAGNOSIS — Z79899 Other long term (current) drug therapy: Secondary | ICD-10-CM | POA: Diagnosis not present

## 2015-03-16 DIAGNOSIS — Z8739 Personal history of other diseases of the musculoskeletal system and connective tissue: Secondary | ICD-10-CM | POA: Insufficient documentation

## 2015-03-16 DIAGNOSIS — F329 Major depressive disorder, single episode, unspecified: Secondary | ICD-10-CM | POA: Insufficient documentation

## 2015-03-16 DIAGNOSIS — Z86010 Personal history of colonic polyps: Secondary | ICD-10-CM | POA: Insufficient documentation

## 2015-03-16 DIAGNOSIS — Z8719 Personal history of other diseases of the digestive system: Secondary | ICD-10-CM | POA: Insufficient documentation

## 2015-03-16 LAB — BASIC METABOLIC PANEL
ANION GAP: 7 (ref 5–15)
BUN: 13 mg/dL (ref 6–20)
CHLORIDE: 108 mmol/L (ref 101–111)
CO2: 23 mmol/L (ref 22–32)
Calcium: 9.7 mg/dL (ref 8.9–10.3)
Creatinine, Ser: 0.68 mg/dL (ref 0.44–1.00)
GFR calc Af Amer: >60 mL/min (ref >60)
GLUCOSE: 94 mg/dL (ref 65–99)
POTASSIUM: 3.9 mmol/L (ref 3.5–5.1)
SODIUM: 138 mmol/L (ref 135–145)

## 2015-03-16 LAB — CBC
HCT: 45.4 % (ref 36.0–46.0)
HEMOGLOBIN: 15.1 g/dL — AB (ref 12.0–15.0)
MCH: 33.4 pg (ref 26.0–34.0)
MCHC: 33.3 g/dL (ref 30.0–36.0)
MCV: 100.4 fL — AB (ref 78.0–100.0)
PLATELETS: 219 10*3/uL (ref 150–400)
RBC: 4.52 MIL/uL (ref 3.87–5.11)
RDW: 12.2 % (ref 11.5–15.5)
WBC: 10.3 10*3/uL (ref 4.0–10.5)

## 2015-03-16 LAB — MAGNESIUM: MAGNESIUM: 2.1 mg/dL (ref 1.7–2.4)

## 2015-03-16 MED ORDER — DILTIAZEM HCL 100 MG IV SOLR
5.0000 mg/h | Freq: Once | INTRAVENOUS | Status: AC
  Administered 2015-03-16: 5 mg/h via INTRAVENOUS
  Filled 2015-03-16: qty 100

## 2015-03-16 MED ORDER — RIVAROXABAN 15 MG PO TABS
15.0000 mg | ORAL_TABLET | Freq: Once | ORAL | Status: AC
  Administered 2015-03-16: 15 mg via ORAL
  Filled 2015-03-16: qty 1

## 2015-03-16 MED ORDER — DILTIAZEM HCL ER COATED BEADS 120 MG PO CP24: 120.0000 mg | ORAL_CAPSULE | Freq: Every day | ORAL | Status: DC

## 2015-03-16 MED ORDER — SODIUM CHLORIDE 0.9 % IV BOLUS (SEPSIS)
1000.0000 mL | Freq: Once | INTRAVENOUS | Status: AC
  Administered 2015-03-16: 1000 mL via INTRAVENOUS

## 2015-03-16 MED ORDER — RIVAROXABAN 20 MG PO TABS: 20.0000 mg | ORAL_TABLET | Freq: Every day | ORAL | Status: DC

## 2015-03-16 MED ORDER — DILTIAZEM HCL 25 MG/5ML IV SOLN
20.0000 mg | Freq: Once | INTRAVENOUS | Status: AC
  Administered 2015-03-16: 20 mg via INTRAVENOUS
  Filled 2015-03-16: qty 5

## 2015-03-16 MED ORDER — DILTIAZEM HCL ER COATED BEADS 120 MG PO CP24
120.0000 mg | ORAL_CAPSULE | Freq: Once | ORAL | Status: AC
  Administered 2015-03-16: 120 mg via ORAL
  Filled 2015-03-16: qty 1

## 2015-03-16 NOTE — ED Notes (Signed)
MD at bedside. 

## 2015-03-16 NOTE — ED Provider Notes (Signed)
CSN: [id number]     Arrival date & time 03/16/15  1646 History   First MD Initiated Contact with Patient 03/16/15 1656     Chief Complaint  Patient presents with  . Tachycardia     (Consider location/radiation/quality/duration/timing/severity/associated sxs/prior Treatment) Patient is a 79 y.o. female presenting with palpitations. The history is provided by the patient.  Palpitations Palpitations quality:  Fast Onset quality:  Sudden Timing:  Intermittent Progression:  Unchanged Chronicity:  New Context: not caffeine and not illicit drugs   Relieved by:  Nothing Worsened by:  Nothing Associated symptoms: back pain (mild)   Associated symptoms: no chest pain, no chest pressure, no cough, no dizziness, no near-syncope, no shortness of breath and no vomiting     Past Medical History  Diagnosis Date  . Depression   . Hyperlipidemia   . Hx of adenomatous colonic polyps   . Cholelithiasis   . 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 Cancer (HCC)      Left breast carcinoma in situ   Past Surgical History  Procedure Laterality Date  . Breast lumpectomy  oct '10    Left  . Knee arthroscopy      Left '00 [place]/ Right '08  . Cholecystectomy, laparoscopic  '06    [facility]  . Lumpectomy- remote      benign  . Total knee arthroplasty  02/05/11    left  . Tooth extraction    . Cholecystectomy     Family History  Problem Relation Age of Onset  . Diabetes Mother     Deceased in 4s  . Heart disease Mother     cad/MI-fatal  . Breast cancer Neg Hx   . Colon cancer Neg Hx   . Cancer Father     liver, Deceased in 25s  . Heart attack Mother   . Heart disease Sister    Social History  Substance Use Topics  . Smoking status: Never Smoker   . Smokeless tobacco: Never Used  . Alcohol Use: 6.0 oz/week    10 Glasses of wine per week     Comment: daily   OB History    Gravida Para Term Preterm AB TAB SAB Ectopic Multiple Living   3 3             Review of  Systems  Constitutional: Negative for fever.  Respiratory: Negative for cough and shortness of breath.   Cardiovascular: Positive for palpitations. Negative for chest pain and near-syncope.  Gastrointestinal: Negative for vomiting and abdominal pain.  Musculoskeletal: Positive for back pain (mild).  Neurological: Negative for dizziness.  All other systems reviewed and are negative.     Allergies  Review of patient's allergies indicates no known allergies.  Home Medications   Prior to Admission medications   Medication Sig Start Date End Date Taking? Authorizing Provider  Cholecalciferol 1000 UNITS tablet Take 1,000 Units by mouth daily.    Historical Provider, MD  FLUoxetine (PROZAC) 10 MG capsule Take 10 mg by mouth daily.      Historical Provider, MD  ibuprofen (ADVIL,MOTRIN) 800 MG tablet Take 800 mg by mouth every 8 (eight) hours as needed.      Historical Provider, MD  Misc Natural Products (TURMERIC CURCUMIN) CAPS Take 2 capsules by mouth as directed.    Historical Provider, MD  OVER THE COUNTER MEDICATION at bedtime as needed. Sleep Aid    Historical Provider, MD  simvastatin (ZOCOR) 40 MG tablet Take  40 mg by mouth at bedtime.    Historical Provider, MD   BP 147/113 mmHg  Pulse 145  Temp(Src) 98.5 F (36.9 C) (Oral)  Resp 20  Ht 5\' 3"  (1.6 m)  Wt 150 lb (68.04 kg)  BMI 26.58 kg/m2  SpO2 97% Physical Exam  Constitutional: She is oriented to person, place, and time. She appears well-developed and well-nourished. No distress.  HENT:  Head: Normocephalic and atraumatic.  Mouth/Throat: Oropharynx is clear and moist.  Eyes: EOM are normal. Pupils are equal, round, and reactive to light.  Neck: Normal range of motion. Neck supple.  Cardiovascular: Regular rhythm.  Tachycardia present.  Exam reveals no friction rub.   No murmur heard. Pulmonary/Chest: Effort normal and breath sounds normal. No respiratory distress. She has no wheezes. She has no rales.  Abdominal: Soft.  She exhibits no distension. There is no tenderness. There is no rebound.  Musculoskeletal: Normal range of motion. She exhibits no edema.  Neurological: She is alert and oriented to person, place, and time.  Skin: No rash noted. She is not diaphoretic.  Nursing note and vitals reviewed.   ED Course  Procedures (including critical care time) Labs Review Labs Reviewed  CBC  BASIC METABOLIC PANEL  MAGNESIUM    Imaging Review No results found. I have personally reviewed and evaluated these images and lab results as part of my medical decision-making.   EKG Interpretation   Date/Time:  Wednesday March 16 2015 17:08:59 EDT Ventricular Rate:  148 PR Interval:  92 QRS Duration: 110 QT Interval:  310 QTC Calculation: 486 R Axis:   90 Text Interpretation:  Sinus tachycardia with short PR Rightward axis  Septal infarct , age undetermined Marked ST abnormality, possible inferior  subendocardial injury Abnormal ECG Short PR new, tachycardia new Confirmed  by Mingo Amber  MD, [place] ([id number]) on 03/16/2015 5:11:45 PM      CRITICAL CARE Performed by: Osvaldo Shipper   Total critical care time: 30 minutes  Critical care time was exclusive of separately billable procedures and treating other patients.  Critical care was necessary to treat or prevent imminent or life-threatening deterioration.  Critical care was time spent personally by me on the following activities: development of treatment plan with patient and/or surrogate as well as nursing, discussions with consultants, evaluation of patient's response to treatment, examination of patient, obtaining history from patient or surrogate, ordering and performing treatments and interventions, ordering and review of laboratory studies, ordering and review of radiographic studies, pulse oximetry and re-evaluation of patient's condition.  MDM   Final diagnoses:  Atrial flutter with rapid ventricular response ([place])    79 year old  female sent from her earlier care doctor's office upstairs for palpitations. She is found to be in atrial flutter at the office. She's had palpitations since Saturday, but she denies any chest pain, shortness of breath. Palpitations have been intermittent. No alleviating or exacerbating factors. Here she is in sinus tachycardia with a short PR interval, this is new. Will speak with cardiology for concern of possible WPW. I do not see flutter waves that it makes his commencing of atrial flutter. The EKG has a regular rhythm, so Atrial Fib seems unlikely.  Repeat EKG after diltiazem with sinus rhythm. Will admit for further management. She's been in [place] intermittently for several days, and it could be continuously. She didn't feel like she was having palpitations today when she was in Atrial Flutter.  She converted quickly after diltiazem infusion. I spoke with Dr. Roel Cluck of  internal medicine, she recommended this patient can likely be discharged. I agree. I ran the patient by Dr. Debara Pickett of Cardiology - he recommended continuing on low dose diltiazem and initiation of Xarelto. Her CHADS2VASC score is 3.  No further A-flutter with RVR. Stable for discharge.   Evelina Bucy, MD 03/16/15 2052

## 2015-03-16 NOTE — Progress Notes (Signed)
Pre visit review using our clinic review tool, if applicable. No additional management support is needed unless otherwise documented below in the visit note/SLS  

## 2015-03-16 NOTE — Progress Notes (Signed)
Patient presents to clinic today c/o palpitations intermittently since Friday associated with SOB and left-sided back pain. Denies change to diet or stress levels. Is experiencing racing heart at present but denies chest pain or SOB presently.  Past Medical History  Diagnosis Date  . Depression   . Hyperlipidemia   . Hx of adenomatous colonic polyps   . Cholelithiasis   . 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 Cancer (HCC)      Left breast carcinoma in situ    Current Outpatient Prescriptions on File Prior to Visit  Medication Sig Dispense Refill  . Cholecalciferol 1000 UNITS tablet Take 1,000 Units by mouth daily.    Marland Kitchen FLUoxetine (PROZAC) 10 MG capsule Take 10 mg by mouth daily.      Marland Kitchen ibuprofen (ADVIL,MOTRIN) 800 MG tablet Take 800 mg by mouth every 8 (eight) hours as needed.      . Misc Natural Products (TURMERIC CURCUMIN) CAPS Take 2 capsules by mouth as directed.    Marland Kitchen OVER THE COUNTER MEDICATION at bedtime as needed. Sleep Aid    . simvastatin (ZOCOR) 40 MG tablet Take 40 mg by mouth at bedtime.     No current facility-administered medications on file prior to visit.    No Known Allergies  Family History  Problem Relation Age of Onset  . Diabetes Mother     Deceased in 36s  . Heart disease Mother     cad/MI-fatal  . Breast cancer Neg Hx   . Colon cancer Neg Hx   . Cancer Father     liver, Deceased in 72s  . Heart attack Mother   . Heart disease Sister     Social History   Social History  . Marital Status: Married    Spouse Name: N/A  . Number of Children: 3  . Years of Education: N/A   Occupational History  . 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 Smoking status: Never Smoker   . Smokeless tobacco: Never Used  . Alcohol Use: 6.0 oz/week    10 Glasses of wine per week  . Drug Use: No  . Sexual Activity: Not Asked   Other Topics Concern  . None   Social History  Narrative   [employer]. HSG. Married '59, 3 daughters- '61, '64, '73; 2 grandchildren.Marriage in good health. Exercise - goes to gym 5/wk. ACP - directed to the [email or web address].[email or web address]/.   Review of Systems - See HPI.  All other ROS are negative.  BP 132/98 mmHg  Pulse 147  Temp(Src) 98.1 F (36.7 C) (Oral)  Resp 16  Ht 5\' 2"  (1.575 m)  Wt 173 lb 4 oz (78.586 kg)  BMI 31.68 kg/m2  SpO2 97%  Physical Exam  Constitutional: She is oriented to person, place, and time and well-developed, well-nourished, and in no distress.  HENT:  Head: Normocephalic and atraumatic.  Cardiovascular: Normal heart sounds and intact distal pulses.  A regularly irregular rhythm present. Tachycardia present.   Pulmonary/Chest: Effort normal and breath sounds normal. No respiratory distress. She has no wheezes. She has no rales. She exhibits no tenderness.  Neurological: She is alert and oriented to person, place, and time.  Vitals reviewed.   No results found for this or any previous visit (from the past 2160 hour(s)).  Assessment/Plan: Atrial flutter by electrocardiogram [facility]) With some EKG signs of old infarct and ischemia. Giving  recent SOB and left-sided back pain along with new onset a. Flutter, patient triaged to ER downstairs for assessment and management. Dr. Mingo Amber (ER) informed and awaits her arrival.

## 2015-03-16 NOTE — ED Notes (Signed)
Pt states she has felt heart racing intermittent since saturday-felt SOB with exercise class yesterday-sent from PCP in house for Afib 147

## 2015-03-16 NOTE — Discharge Instructions (Signed)
Atrial Flutter °Atrial flutter is a heart rhythm that can cause the heart to beat very fast (tachycardia). It originates in the upper chambers of the heart (atria). In atrial flutter, the top chambers of the heart (atria) often beat much faster than the bottom chambers of the heart (ventricles). Atrial flutter has a regular "saw toothed" appearance in an EKG readout. An EKG is a test that records the electrical activity of the heart. Atrial flutter can cause the heart to beat up to 150 beats per minute (BPM). Atrial flutter can either be short lived (paroxysmal) or permanent.  °CAUSES  °Causes of atrial flutter can be many. Some of these include: °· Heart related issues: °¨ Heart attack (myocardial infarction). °¨ Heart failure. °¨ Heart valve problems. °¨ Poorly controlled high blood pressure (hypertension). °¨ After open heart surgery. °· Lung related issues: °¨ A blood clot in the lungs (pulmonary embolism). °¨ Chronic obstructive pulmonary disease (COPD). Medications used to treat COPD can attribute to atrial flutter. °· Other related causes: °¨ Hyperthyroidism. °¨ Caffeine. °¨ Some decongestant cold medications. °¨ Low electrolyte levels such as potassium or magnesium. °¨ Cocaine. °SYMPTOMS °· An awareness of your heart beating rapidly (palpitations). °· Shortness of breath. °· Chest pain. °· Low blood pressure (hypotension). °· Dizziness or fainting. °DIAGNOSIS  °Different tests can be performed to diagnose atrial flutter.  °· An EKG. °· Holter monitor. This is a 24-hour recording of your heart rhythm. You will also be given a diary. Write down all symptoms that you have and what you were doing at the time you experienced symptoms. °· Cardiac event monitor. This small device can be worn for up to 30 days. When you have heart symptoms, you will push a button on the device. This will then record your heart rhythm. °· Echocardiogram. This is an imaging test to look at your heart. Your caregiver will look at your  heart valves and the ventricles. °· Stress test. This test can help determine if the atrial flutter is related to exercise or if coronary artery disease is present. °· Laboratory studies will look at certain blood levels like: °¨ Complete blood count (CBC). °¨ Potassium. °¨ Magnesium. °¨ Thyroid function. °TREATMENT  °Treatment of atrial flutter varies. A combination of therapies may be used or sometimes atrial flutter may need only 1 type of treatment.  °Lab work: °If your blood work, such as your electrolytes (potassium, magnesium) or your thyroid function tests, are abnormal, your caregiver will treat them accordingly.  °Medication:  °There are several different types of medications that can convert your heart to a normal rhythm and prevent atrial flutter from reoccurring.  °Nonsurgical procedures: °Nonsurgical techniques may be used to control atrial flutter. Some examples include: °· Cardioversion. This technique uses either drugs or an electrical shock to restore a normal heart rhythm: °¨ Cardioversion drugs may be given through an intravenous (IV) line to help "reset" the heart rhythm. °¨ In electrical cardioversion, your caregiver shocks your heart with electrical energy. This helps to reset the heartbeat to a normal rhythm. °· Ablation. If atrial flutter is a persistent problem, an ablation may be needed. This procedure is done under mild sedation. High frequency radio-wave energy is used to destroy the area of heart tissue responsible for atrial flutter. °SEEK IMMEDIATE MEDICAL CARE IF:  °You have: °· Dizziness. °· Near fainting or fainting. °· Shortness of breath. °· Chest pain or pressure. °· Sudden nausea or vomiting. °· Profuse sweating. °If you have the above symptoms,   call your local emergency service immediately! Do not drive yourself to the hospital. °MAKE SURE YOU:  °· Understand these instructions. °· Will watch your condition. °· Will get help right away if you are not doing well or get worse. °   °This information is not intended to replace advice given to you by your health care provider. Make sure you discuss any questions you have with your health care provider. °  °Document Released: 10/14/2008 Document Revised: 06/18/2014 Document Reviewed: 12/10/2014 °Elsevier Interactive Patient Education ©2016 [employer] ° °

## 2015-03-16 NOTE — Assessment & Plan Note (Signed)
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 Giving recent SOB and left-sided back pain along with new onset a. Flutter, patient triaged to ER downstairs for assessment and management. Dr. Mingo Amber (ER) informed and awaits her arrival.

## 2015-03-17 ENCOUNTER — Encounter: Payer: Self-pay | Admitting: Physician Assistant

## 2015-03-23 ENCOUNTER — Ambulatory Visit (INDEPENDENT_AMBULATORY_CARE_PROVIDER_SITE_OTHER): Payer: Medicare Other | Admitting: Physician Assistant

## 2015-03-23 ENCOUNTER — Encounter: Payer: Self-pay | Admitting: Physician Assistant

## 2015-03-23 VITALS — BP 128/86 | HR 78 | Temp 98.0°F | Resp 16 | Ht 62.0 in | Wt 167.1 lb

## 2015-03-23 DIAGNOSIS — I4892 Unspecified atrial flutter: Secondary | ICD-10-CM | POA: Diagnosis not present

## 2015-03-23 NOTE — Progress Notes (Signed)
Patient presents to clinic today for ER follow-up of Atrial flutter with RVR. Was sent to ER on 03/16/15 after seeing me in office where diagnosis was made via EKG. Patient converted to NSR with IV Cardizem. Cardiology was consulted and felt patient stable for discharge on low-dose of diltiazem and Xarelto.  Since discharge, patient endorses taking Diltiazem 120 mg daily and Xarelto 20 mg daily as directed. Endorses taking medications as directed. Patient denies chest pain, palpitations, lightheadedness, dizziness, vision changes or frequent headaches. Has not seen Cardiology -- has appointment on the 27th.  Past Medical History  Diagnosis Date  . Depression   . Hyperlipidemia   . Hx of adenomatous colonic polyps   . Cholelithiasis   . 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 Cancer (HCC)      Left breast carcinoma in situ    Current Outpatient Prescriptions on File Prior to Visit  Medication Sig Dispense Refill  . Cholecalciferol 1000 UNITS tablet Take 1,000 Units by mouth daily.    Marland Kitchen diltiazem (CARDIZEM CD) 120 MG 24 hr capsule Take 1 capsule (120 mg total) by mouth daily. 30 capsule 1  . FLUoxetine (PROZAC) 10 MG capsule Take 10 mg by mouth daily.      Marland Kitchen ibuprofen (ADVIL,MOTRIN) 800 MG tablet Take 800 mg by mouth every 8 (eight) hours as needed.      . Misc Natural Products (TURMERIC CURCUMIN) CAPS Take 2 capsules by mouth as directed.    Marland Kitchen OVER THE COUNTER MEDICATION at bedtime as needed. Sleep Aid    . rivaroxaban (XARELTO) 20 MG TABS tablet Take 1 tablet (20 mg total) by mouth daily with supper. 30 tablet 1  . simvastatin (ZOCOR) 40 MG tablet Take 40 mg by mouth at bedtime.     No current facility-administered medications on file prior to visit.    No Known Allergies  Family History  Problem Relation Age of Onset  . Diabetes Mother     Deceased in 34s  . Heart disease Mother     cad/MI-fatal  . Breast cancer Neg Hx   . Colon cancer Neg Hx   . Cancer  Father     liver, Deceased in 12s  . Heart attack Mother   . Heart disease Sister     Social History   Social History  . Marital Status: Married    Spouse Name: N/A  . Number of Children: 3  . Years of Education: N/A   Occupational History  . 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 Smoking status: Never Smoker   . Smokeless tobacco: Never Used  . Alcohol Use: 6.0 oz/week    10 Glasses of wine per week     Comment: daily  . Drug Use: No  . Sexual Activity: Not Asked   Other Topics Concern  . None   Social History Narrative   [employer]. Married '59, 3 daughters- '61, '64, '73; 2 grandchildren.Marriage in good health. Exercise - goes to gym 5/wk. ACP - directed to the [email or web address]   Review of Systems - See HPI.  All other ROS are negative.  BP 128/86 mmHg  Pulse 78  Temp(Src) 98 F (36.7 C) (Oral)  Resp 16  Ht 5\' 2"  (1.575 m)  Wt 167 lb 2 oz (75.807 kg)  BMI 30.56 kg/m2  SpO2 95%  Physical Exam  Constitutional: She is  oriented to person, place, and time and well-developed, well-nourished, and in no distress.  HENT:  Head: Normocephalic and atraumatic.  Eyes: Conjunctivae are normal.  Neck: Neck supple.  Cardiovascular: Normal rate, regular rhythm, normal heart sounds and intact distal pulses.   Pulmonary/Chest: Effort normal and breath sounds normal. No respiratory distress. She has no wheezes. She has no rales. She exhibits no tenderness.  Neurological: She is alert and oriented to person, place, and time.  Skin: Skin is warm and dry. No rash noted.  Psychiatric: Affect normal.  Vitals reviewed.   Recent Results (from the past 2160 hour(s))  CBC     Status: Abnormal   Collection Time: 03/16/15  5:30 PM  Result Value Ref Range   WBC 10.3 4.0 - 10.5 K/uL   RBC 4.52 3.87 - 5.11 MIL/uL   Hemoglobin 15.1 (H) 12.0 - 15.0 g/dL   HCT 45.4 36.0 - 46.0 %   MCV 100.4 (H) 78.0 - 100.0 fL   MCH  33.4 26.0 - 34.0 pg   MCHC 33.3 30.0 - 36.0 g/dL   RDW 12.2 11.5 - 15.5 %   Platelets 219 150 - 400 K/uL  Basic metabolic panel     Status: None   Collection Time: 03/16/15  5:30 PM  Result Value Ref Range   Sodium 138 135 - 145 mmol/L   Potassium 3.9 3.5 - 5.1 mmol/L   Chloride 108 101 - 111 mmol/L   CO2 23 22 - 32 mmol/L   Glucose, Bld 94 65 - 99 mg/dL   BUN 13 6 - 20 mg/dL   Creatinine, Ser 0.68 0.44 - 1.00 mg/dL   Calcium 9.7 8.9 - 10.3 mg/dL   GFR calc non Af Amer >60 >60 mL/min   GFR calc Af Amer >60 >60 mL/min    Comment: (NOTE) The eGFR has been calculated using the CKD EPI equation. This calculation has not been validated in all clinical situations. eGFR's persistently <60 mL/min signify possible Chronic Kidney Disease.    Anion gap 7 5 - 15  Magnesium     Status: None   Collection Time: 03/16/15  5:30 PM  Result Value Ref Range   Magnesium 2.1 1.7 - 2.4 mg/dL    Assessment/Plan: Atrial flutter by electrocardiogram (HCC) Ventricular rate controlled with pulse a 78. Tolerating medications well. Regular rate and rhythm on auscultation today. No residual symptoms. Is taking Xarelto as directed. Patient to continue medications and follow-up with Cardiology as scheduled. Alarm signs/symptoms discussed with patient that would prompt return to ER.

## 2015-03-23 NOTE — Progress Notes (Signed)
Pre visit review using our clinic review tool, if applicable. No additional management support is needed unless otherwise documented below in the visit note/SLS  

## 2015-03-23 NOTE — Patient Instructions (Signed)
Please continue medications as directed. Limit caffeine and alcohol.  Stay well hydrated. Resume normal activities. Follow-up with Cardiology as scheduled.  If you develop any racing heart with shortness of breath, lightheadedness/dizziness, please call 911.

## 2015-03-23 NOTE — Assessment & Plan Note (Signed)
Ventricular rate controlled with pulse a 78. Tolerating medications well. Regular rate and rhythm on auscultation today. No residual symptoms. Is taking Xarelto as directed. Patient to continue medications and follow-up with Cardiology as scheduled. Alarm signs/symptoms discussed with patient that would prompt return to ER.

## 2015-04-07 ENCOUNTER — Encounter: Payer: Self-pay | Admitting: Cardiology

## 2015-04-07 ENCOUNTER — Ambulatory Visit (INDEPENDENT_AMBULATORY_CARE_PROVIDER_SITE_OTHER): Payer: Medicare Other | Admitting: Cardiology

## 2015-04-07 VITALS — BP 124/80 | HR 50 | Ht 62.0 in | Wt 167.4 lb

## 2015-04-07 DIAGNOSIS — I480 Paroxysmal atrial fibrillation: Secondary | ICD-10-CM | POA: Diagnosis not present

## 2015-04-07 DIAGNOSIS — E785 Hyperlipidemia, unspecified: Secondary | ICD-10-CM

## 2015-04-07 DIAGNOSIS — Z7901 Long term (current) use of anticoagulants: Secondary | ICD-10-CM | POA: Diagnosis not present

## 2015-04-07 MED ORDER — SIMVASTATIN 20 MG PO TABS: 20.0000 mg | ORAL_TABLET | Freq: Every day | ORAL | Status: DC

## 2015-04-07 MED ORDER — DILTIAZEM HCL ER COATED BEADS 120 MG PO CP24: 120.0000 mg | ORAL_CAPSULE | Freq: Every day | ORAL | Status: DC

## 2015-04-07 MED ORDER — RIVAROXABAN 20 MG PO TABS: 20.0000 mg | ORAL_TABLET | Freq: Every day | ORAL | Status: DC

## 2015-04-07 NOTE — Patient Instructions (Signed)
Medication Instructions:  The current medical regimen is effective;  continue present plan and medications.  Testing/Procedures: Your physician has requested that you have an echocardiogram. Echocardiography is a painless test that uses sound waves to create images of your heart. It provides your doctor with information about the size and shape of your heart and how well your heart's chambers and valves are working. This procedure takes approximately one hour. There are no restrictions for this procedure.  Follow-Up: Follow up in 2 months with Dr Skains.  If you need a refill on your cardiac medications before your next appointment, please call your pharmacy.  Thank you for choosing [facility]!!     

## 2015-04-07 NOTE — Progress Notes (Signed)
Cardiology Office Note   Date:  04/07/2015   ID:  Sheila Schmidt, DOB 1936-05-21, MRN [id number]  PCP:  Sheila Rio, PA-C  Cardiologist:   Sheila Furbish, MD       History of Present Illness: Sheila Schmidt is a 79 y.o. female who presents for evaluation of atrial fibrillation with rapid ventricular response seen in emergency department on 03/16/15. She converted to normal sinus rhythm after IV diltiazem. Stable for discharge. Low-dose diltiazem and Xarelto.  Noted AFIB once or twice while sleeping. Went to see Sheila Schmidt. No CP, no SOB, no syncope, no bleeding. No Fever, no cough. She was there in the office, they wheeled her down to the emergency room for further evaluation. She spontaneously converted after IV diltiazem.  She has not felt any atrial fibrillation since then. No chest pain, no shortness of breath.  She has not had any bleeding since on Xarelto. She does not snore. She does enjoy wine.  No smoking  Mother and sister had heart issues of unknown etiology.   Former breast cancer., Left knee replacement.    Past Medical History  Diagnosis Date  . Depression   . Hyperlipidemia   . Hx of adenomatous colonic polyps   . Cholelithiasis   . 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 Cancer (HCC)      Left breast carcinoma in situ    Past Surgical History  Procedure Laterality Date  . Breast lumpectomy  oct '10    Left  . Knee arthroscopy      Left '00 [place]/ Right '08  . Cholecystectomy, laparoscopic  '06    [place]  . Lumpectomy- remote      benign  . Total knee arthroplasty  02/05/11    left  . Tooth extraction    . Cholecystectomy       Current Outpatient Prescriptions  Medication Sig Dispense Refill  . acetaminophen (TYLENOL) 500 MG tablet Take 500 mg by mouth daily.    . Cholecalciferol 1000 UNITS tablet Take 1,000 Units by mouth daily.    Marland Kitchen diltiazem (CARDIZEM CD) 120 MG 24 hr capsule Take 1 capsule (120 mg total)  by mouth daily. 30 capsule 1  . FLUoxetine (PROZAC) 10 MG capsule Take 10 mg by mouth daily.      Marland Kitchen OVER THE COUNTER MEDICATION at bedtime as needed. Sleep Aid    . rivaroxaban (XARELTO) 20 MG TABS tablet Take 1 tablet (20 mg total) by mouth daily with supper. 30 tablet 1   No current facility-administered medications for this visit.    Allergies:   Review of patient's allergies indicates no known allergies.    Social History:  The patient  reports that she has never smoked. She has never used smokeless tobacco. She reports that she drinks about 6.0 oz of alcohol per week. She reports that she does not use illicit drugs.   Family History:  The patient's family history includes Cancer in her father; Diabetes in her mother; Heart attack in her mother; Heart disease in her mother and sister. There is no history of Breast cancer or Colon cancer.    ROS:  Please see the history of present illness.   Otherwise, review of systems are positive for none.   All other systems are reviewed and negative.    PHYSICAL EXAM: VS:  BP 124/80 mmHg  Pulse 50  Ht 5\' 2"  (1.575 m)  Wt 167 lb 6.4  oz (75.932 kg)  BMI 30.61 kg/m2  SpO2 97% , BMI Body mass index is 30.61 kg/(m^2). GEN: Well nourished, well developed, in no acute distress HEENT: normal Neck: no JVD, carotid bruits, or masses Cardiac: Irregularly irregular, mildly tachycardic briefly, at first regular bradycardic no murmurs, rubs, or gallops,no edema  Respiratory:  clear to auscultation bilaterally, normal work of breathing GI: soft, nontender, nondistended, + BS MS: no deformity or atrophy Skin: warm and dry, no rash Neuro:  Strength and sensation are intact Psych: euthymic mood, full affect   EKG:  EKG from 03/16/15 reviewed, atrial fibrillation with rapid ventricular response   Recent Labs: 03/16/2015: BUN 13; Creatinine, Ser 0.68; Hemoglobin 15.1*; Magnesium 2.1; Platelets 219; Potassium 3.9; Sodium 138    Lipid Panel      Component Value Date/Time   CHOL 179 09/25/2013 0920   TRIG 89 09/25/2013 0920   HDL 66 09/25/2013 0920   CHOLHDL 2.7 09/25/2013 0920   VLDL 18 09/25/2013 0920   LDLCALC 95 09/25/2013 0920      Wt Readings from Last 3 Encounters:  04/07/15 167 lb 6.4 oz (75.932 kg)  03/23/15 167 lb 2 oz (75.807 kg)  03/16/15 150 lb (68.04 kg)      Other studies Reviewed: Additional studies/ records that were reviewed today include: Office note, lab work, EKGs. Review of the above records demonstrates: As above   ASSESSMENT AND PLAN:  1.  Atrial fibrillation, paroxysmal  - Anticoagulation Xarelto. CHADS-VASc 3  - Describe risks of bleeding.  - Continue with diltiazem CD 120 mg once a day.  - Seems to still have some paroxysmal episodes although when I was listening to her and she was in atrial fibrillation she was asymptomatic with this.  - We will check an echocardiogram to ensure proper structure and function.  - Prior TSH was normal.  - Try to avoid stimulants, excessive caffeine, decongestants, excessive alcohol  2. Hyperlipidemia  - We will decrease simvastatin to 20 mg since she is taking diltiazem  3. Chronic anticoagulation  - Xarelto as above     Current medicines are reviewed at length with the patient today.  The patient does not have concerns regarding medicines.  The following changes have been made:  As abvoe  Labs/ tests ordered today include:  No orders of the defined types were placed in this encounter.     Disposition:   FU with Sheila Schmidt  in 2 months  Signed, Sheila Furbish, MD  04/07/2015 2:52 PM    [facility] [address], [place], [place]  [address] Phone: [phone]; Fax: [phone]

## 2015-04-19 ENCOUNTER — Ambulatory Visit (HOSPITAL_COMMUNITY): Payer: Medicare Other | Attending: Cardiology

## 2015-04-19 ENCOUNTER — Other Ambulatory Visit: Payer: Self-pay

## 2015-04-19 ENCOUNTER — Telehealth: Payer: Self-pay

## 2015-04-19 DIAGNOSIS — I340 Nonrheumatic mitral (valve) insufficiency: Secondary | ICD-10-CM | POA: Insufficient documentation

## 2015-04-19 DIAGNOSIS — I351 Nonrheumatic aortic (valve) insufficiency: Secondary | ICD-10-CM | POA: Diagnosis not present

## 2015-04-19 DIAGNOSIS — E785 Hyperlipidemia, unspecified: Secondary | ICD-10-CM | POA: Diagnosis not present

## 2015-04-19 DIAGNOSIS — I480 Paroxysmal atrial fibrillation: Secondary | ICD-10-CM | POA: Diagnosis present

## 2015-04-19 DIAGNOSIS — I371 Nonrheumatic pulmonary valve insufficiency: Secondary | ICD-10-CM | POA: Insufficient documentation

## 2015-04-19 NOTE — Telephone Encounter (Signed)
Left a message to schedule annual wellness visit

## 2015-04-21 ENCOUNTER — Telehealth: Payer: Self-pay | Admitting: Cardiology

## 2015-04-21 NOTE — Telephone Encounter (Signed)
Spoke with pt and reviewed echo results with her.

## 2015-04-21 NOTE — Telephone Encounter (Signed)
New problem   Pt returning call concerning Echo results.

## 2015-05-19 ENCOUNTER — Encounter: Payer: Self-pay | Admitting: Cardiology

## 2015-05-19 ENCOUNTER — Telehealth: Payer: Self-pay | Admitting: Cardiology

## 2015-05-19 ENCOUNTER — Ambulatory Visit (INDEPENDENT_AMBULATORY_CARE_PROVIDER_SITE_OTHER): Payer: Medicare Other | Admitting: Cardiology

## 2015-05-19 VITALS — BP 120/80 | HR 140 | Ht 62.0 in | Wt 164.1 lb

## 2015-05-19 DIAGNOSIS — I10 Essential (primary) hypertension: Secondary | ICD-10-CM

## 2015-05-19 DIAGNOSIS — Z7901 Long term (current) use of anticoagulants: Secondary | ICD-10-CM

## 2015-05-19 DIAGNOSIS — I480 Paroxysmal atrial fibrillation: Secondary | ICD-10-CM | POA: Diagnosis not present

## 2015-05-19 MED ORDER — DILTIAZEM HCL ER COATED BEADS 180 MG PO CP24: 180.0000 mg | ORAL_CAPSULE | Freq: Every day | ORAL | Status: DC

## 2015-05-19 NOTE — Progress Notes (Signed)
Cardiology Office Note   Date:  05/19/2015   ID:  Sheila Schmidt, DOB 08/31/1935, MRN [id number]  PCP:  Leeanne Rio, PA-C  Cardiologist:   Candee Furbish, MD       History of Present Illness: Sheila Schmidt is a 79 y.o. female who presents for evaluation of atrial fibrillation with rapid ventricular response seen in emergency department on 03/16/15. She converted to normal sinus rhythm after IV diltiazem. Stable for discharge. Low-dose diltiazem and Xarelto.  Noted AFIB once or twice while sleeping. Went to see [name]. No CP, no SOB, no syncope, no bleeding. No Fever, no cough. She was there in the office, they wheeled her down to the emergency room for further evaluation. She spontaneously converted after IV diltiazem.  She has not felt any atrial fibrillation since then. Denies chest pain, palpitations, lightheaded, syncope, orthopnea, PND, leg swelling, easy bruising.  Dyspneic when she climbs one flight of stairs at the gym.  Goes to gym (spin once per week, pilates/yoga once per week, weights twice per week, walking/biking once per week, golf once per week) without dyspnea.  Since it has been cold she occasionally notices blood on tissue when she blows her nose in AM; she has noticed this in the past during the winter, before she was on Xarelto.    HR 140s in office and she is asymptomatic. She says she has not yet taken diltiazem today because she has not yet eaten breakfast.  She normally takes it with breakfast around 8AM.   Past Medical History  Diagnosis Date  . Depression   . Hyperlipidemia   . Hx of adenomatous colonic polyps   . Cholelithiasis   . 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 Cancer (HCC)      Left breast carcinoma in situ    Past Surgical History  Procedure Laterality Date  . Breast lumpectomy  oct '10    Left  . Knee arthroscopy      Left '00 [place]/ Right '08  . Cholecystectomy, laparoscopic  '06    [place]  .  Lumpectomy- remote      benign  . Total knee arthroplasty  02/05/11    left  . Tooth extraction    . Cholecystectomy       Current Outpatient Prescriptions  Medication Sig Dispense Refill  . acetaminophen (TYLENOL) 500 MG tablet Take 500 mg by mouth daily.    . Cholecalciferol 1000 UNITS tablet Take 1,000 Units by mouth daily.    Marland Kitchen diltiazem (CARDIZEM CD) 120 MG 24 hr capsule Take 1 capsule (120 mg total) by mouth daily. 30 capsule 11  . FLUoxetine (PROZAC) 10 MG capsule Take 10 mg by mouth daily.      Marland Kitchen OVER THE COUNTER MEDICATION at bedtime as needed. Sleep Aid    . rivaroxaban (XARELTO) 20 MG TABS tablet Take 1 tablet (20 mg total) by mouth daily with supper. 30 tablet 11  . simvastatin (ZOCOR) 20 MG tablet Take 1 tablet (20 mg total) by mouth at bedtime. 30 tablet 11   No current facility-administered medications for this visit.    Allergies:   Review of patient's allergies indicates no known allergies.    Social History:  The patient  reports that she has never smoked. She has never used smokeless tobacco. She reports that she drinks about 6.0 oz of alcohol per week. She reports that she does not use illicit drugs.  Family History:  The patient's family history includes Cancer in her father; Diabetes in her mother; Heart attack in her mother; Heart disease in her mother and sister. There is no history of Breast cancer or Colon cancer.    ROS:  Please see the history of present illness.   Otherwise, review of systems are positive for none.   All other systems are reviewed and negative.    PHYSICAL EXAM: VS:  BP 120/80 mmHg  Pulse 140  Ht 5\' 2"  (1.575 m)  Wt 164 lb 1.9 oz (74.444 kg)  BMI 30.01 kg/m2  SpO2 97% , BMI Body mass index is 30.01 kg/(m^2). GEN: Well nourished, well developed, in no acute distress HEENT: normal Neck: no JVD, carotid bruits, or masses Cardiac: Irregularly irregular, mildly tachycardic briefly, at first regular bradycardic no murmurs, rubs, or  gallops,no edema  Respiratory:  clear to auscultation bilaterally, normal work of breathing GI: soft, nontender, nondistended, + BS MS: no deformity or atrophy Skin: warm and dry, no rash Neuro:  Strength and sensation are intact Psych: euthymic mood, full affect   EKG:  EKG from 03/16/15 reviewed, atrial fibrillation with rapid ventricular response   Recent Labs: 03/16/2015: BUN 13; Creatinine, Ser 0.68; Hemoglobin 15.1*; Magnesium 2.1; Platelets 219; Potassium 3.9; Sodium 138    Lipid Panel    Component Value Date/Time   CHOL 179 09/25/2013 0920   TRIG 89 09/25/2013 0920   HDL 66 09/25/2013 0920   CHOLHDL 2.7 09/25/2013 0920   VLDL 18 09/25/2013 0920   LDLCALC 95 09/25/2013 0920      Wt Readings from Last 3 Encounters:  05/19/15 164 lb 1.9 oz (74.444 kg)  04/07/15 167 lb 6.4 oz (75.932 kg)  03/23/15 167 lb 2 oz (75.807 kg)      Other studies Reviewed: Additional studies/ records that were reviewed today include: Office note, lab work, EKGs. Review of the above records demonstrates: As above   ASSESSMENT AND PLAN:  1.  Atrial fibrillation, persistent.  Asymptomatic, no significant bleeding.  But given rate 140s after missing just one dose will increase to 180.  She took her 120mg  tablet in the office.  - continue anticoagulation with Xarelto. CHADS-VASc 3  - Continue with diltiazem CD, increase from 120mg  to 180mg  per day (starting tomorrow). - She will call us later today and let us know what her pulse is so that we know her rate is controlled again.  If still in RVR she was instructed to take a second 120mg  Cardizem tablet today.  - Try to avoid stimulants, excessive caffeine, decongestants, excessive alcohol  2. Hyperlipidemia  - continue simvastatin 20 mg (lower dose on diltiazem)  3. Chronic anticoagulation  - Xarelto as above  Current medicines are reviewed at length with the patient today.  The patient does not have concerns regarding medicines.  The  following changes have been made:  As abvoe  Labs/ tests ordered today include:  No orders of the defined types were placed in this encounter.    Disposition:   FU with Skains  in 2 months  Signed, Candee Furbish, MD  05/19/2015 9:36 AM    [facility] [place], [place], [place]  [address] Phone: [phone]; Fax: [phone]

## 2015-05-19 NOTE — Patient Instructions (Signed)
Medication Instructions:  Please start Cardizem CD 180 mg a day tomorrow. Continue all other medications as listed.  Follow-Up: Follow up in 2 months with Dr Marlou Porch.  Any Other Special Instructions Will Be Listed Below. Please recheck your heart rate around 1 pm and call us back.  If your heart rate is above 115 bpm you will need to take an extra 120 mg of Cardizem.  If you need a refill on your cardiac medications before your next appointment, please call your pharmacy.  Thank you for choosing [facility]!!    How to Take a Pulse Your pulse is the increase in pressure inside the blood vessels that carry blood from your heart to the rest of your body (arteries). Every time your heart beats, you can feel your pulse in an artery near the surface of your skin. You can easily feel your pulse in the artery in your wrist (radial artery) and in the artery in your neck (carotid artery). Learning to take your pulse can tell you how fast your heart is beating and whether it has a normal rhythm. You can also tell whether your heart is beating strongly or weakly. WHAT YOU NEED TO KNOW ABOUT PULSE RATES  Your pulse is the same as your heart rate. Both are measured in beats per minute (bpm). A normal resting heart rate varies depending on a person's age.   Infants under 1 year of age: Normal heart rate of 100-160 bpm.  Children 44-55 years of age: Normal heart rate of 90-150 bpm.  Children 44-24 years of age: Normal heart rate of 80-140 bpm.  Children 67-85 years of age: Normal heart rate of 70-120 bpm.  Everyone over 65 years of age: Normal heart rate of 60-100 bpm. There can be a lot of variation in your pulse. It can be different depending on the time of day or the amount of exercise you get. It changes with your fitness level. Many things can change the speed and regularity of your pulse. These include:  Exercise.  Fever.  Stress.  Heart problems.  Poor  circulation.  Medicines. HOW TO TAKE YOUR PULSE To take your pulse, all you need is a digital stopwatch or a clock or watch with a second hand. The best time to measure your resting pulse is in the morning before you start moving around. Take it as soon as you wake up or after resting for about 10 minutes. There are no firm rules about how often to check your pulse. In general, it is a good idea to check your pulse at least once a month. Measuring your pulse is a good way to check your heart health. Checking your pulse before and after exercise can tell you if you are getting the right amount of exercise. This is called finding your target heart rate. Your target heart rate depends on your age, fitness, and health. Ask your health care provider what is a safe target heart rate for you during exercise. Radial Pulse To check the pulse in your radial artery: 1. Turn one hand palm up and relax your arm. 2. Place the first two fingers of your opposite hand gently over your wrist, just below the base of your thumb. 3. Place your fingertips just inside the bone that runs along the outside of your arm. 4. Slowly increase pressure until you feel a pulsing beneath your fingers. You may need to move your fingers slightly. 5. Do not press too hard. Too much pressure  may cut off blood supply. 6. Count how many pulse beats you feel in 1 minute. You can also count for 30 seconds and double the number. 7. Pay attention to the rhythm of the pulse. It should be steady and even. Carotid Pulse To check the pulse in your carotid artery: 1. Place two fingers just to one side of your Adam's apple so that you feel a pulsing beneath your fingers. 2. Do not press too hard. Too much pressure may cut off blood supply and can make you dizzy. 3. Count how many pulse beats you feel in 1 minute. You can also count for 30 seconds and double the number. 4. Pay attention to the rhythm of the pulse. It should be steady and  even. SEEK MEDICAL CARE IF:   Your pulse is too slow or too fast.  Your pulse is weak or hard to find.  You have skipped beats or extra beats.  Your pulse has an irregular rhythm.  You have an abnormal pulse along with dizziness, fatigue, or shortness of breath.   This information is not intended to replace advice given to you by your health care provider. Make sure you discuss any questions you have with your health care provider.   Document Released: 12/02/2002 Document Revised: 06/18/2014 Document Reviewed: 05/01/2013 Elsevier Interactive Patient Education [employer].

## 2015-05-19 NOTE — Telephone Encounter (Signed)
New Message  Pt states that she was advised to call back and give her Pulse. She reports that he Pulse was 75. Please call back to disucss.

## 2015-05-19 NOTE — Telephone Encounter (Signed)
Left message for pt that because her HR was 75 she will not need to take extra Cardizem today but to please start the 180 mg as ordered tomorrow.  Requested she call back to discuss further if necessary.

## 2015-05-20 ENCOUNTER — Telehealth: Payer: Self-pay | Admitting: Physician Assistant

## 2015-05-20 NOTE — Telephone Encounter (Signed)
error:[id number] ° °

## 2015-05-30 ENCOUNTER — Encounter: Payer: Self-pay | Admitting: Physician Assistant

## 2015-05-31 ENCOUNTER — Ambulatory Visit: Payer: Medicare Other

## 2015-06-08 ENCOUNTER — Encounter: Payer: Medicare Other | Admitting: Physician Assistant

## 2015-07-12 ENCOUNTER — Other Ambulatory Visit: Payer: Self-pay | Admitting: Oncology

## 2015-07-12 DIAGNOSIS — Z9889 Other specified postprocedural states: Secondary | ICD-10-CM

## 2015-07-19 ENCOUNTER — Ambulatory Visit
Admission: RE | Admit: 2015-07-19 | Discharge: 2015-07-19 | Disposition: A | Discharge status: Home / Self Care | Payer: Medicare Other | Source: Ambulatory Visit | Attending: Oncology | Admitting: Oncology

## 2015-07-19 DIAGNOSIS — Z9889 Other specified postprocedural states: Secondary | ICD-10-CM

## 2015-07-21 ENCOUNTER — Ambulatory Visit (INDEPENDENT_AMBULATORY_CARE_PROVIDER_SITE_OTHER): Payer: Medicare Other | Admitting: Cardiology

## 2015-07-21 ENCOUNTER — Encounter: Payer: Self-pay | Admitting: Cardiology

## 2015-07-21 VITALS — BP 130/74 | HR 52 | Ht 62.0 in | Wt 167.0 lb

## 2015-07-21 DIAGNOSIS — I10 Essential (primary) hypertension: Secondary | ICD-10-CM | POA: Diagnosis not present

## 2015-07-21 DIAGNOSIS — E785 Hyperlipidemia, unspecified: Secondary | ICD-10-CM

## 2015-07-21 DIAGNOSIS — Z7901 Long term (current) use of anticoagulants: Secondary | ICD-10-CM | POA: Diagnosis not present

## 2015-07-21 DIAGNOSIS — I480 Paroxysmal atrial fibrillation: Secondary | ICD-10-CM | POA: Diagnosis not present

## 2015-07-21 NOTE — Patient Instructions (Signed)

## 2015-07-21 NOTE — Progress Notes (Signed)
Cardiology Office Note   Date:  07/21/2015   ID:  Sheila Schmidt, DOB 14-Jun-1935, MRN [id number]  PCP:  Leeanne Rio, PA-C  Cardiologist:   Candee Furbish, MD       History of Present Illness: Sheila Schmidt is a 80 y.o. female who presents for follow up of atrial fibrillation with rapid ventricular response seen in emergency department on 03/16/15. She converted to normal sinus rhythm after IV diltiazem. Stable for discharge. Low-dose diltiazem and Xarelto.  Noted AFIB once or twice while sleeping. Went to see [name]. No CP, no SOB, no syncope, no bleeding. No Fever, no cough. She was there in the office, they wheeled her down to the emergency room for further evaluation. She spontaneously converted after IV diltiazem.  She has not felt any atrial fibrillation since then. Denies chest pain, palpitations, lightheaded, syncope, orthopnea, PND, leg swelling, easy bruising.  Dyspneic when she climbs one flight of stairs at the gym.  Goes to gym (spin once per week, pilates/yoga once per week, weights twice per week, walking/biking once per week, golf once per week) without dyspnea.  Since it has been cold she occasionally notices blood on tissue when she blows her nose in AM; she has noticed this in the past during the winter, before she was on Xarelto.    At prior visit HR 140s in office and she is asymptomatic. She says she has not yet taken diltiazem today because she has not yet eaten breakfast.  She normally takes it with breakfast around 8AM.  07/21/15-overall she is doing well. No further palpitations since increasing her diltiazem. Unfortunately, her Xarelto first prescription was $370. This was until she met her deductible of $400. No bleeding, no syncope, no chest pain. She plays golf, goes to the gym 5 days a week. Her right knee is bothering her. Osteoarthritis. Left knee replacement in the past.   Past Medical History  Diagnosis Date  . Depression   . Hyperlipidemia    . Hx of adenomatous colonic polyps   . Cholelithiasis   . 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 Cancer (HCC)      Left breast carcinoma in situ    Past Surgical History  Procedure Laterality Date  . Breast lumpectomy  oct '10    Left  . Knee arthroscopy      Left '00 [place]/ Right '08  . Cholecystectomy, laparoscopic  '06    [place]  . Lumpectomy- remote      benign  . Total knee arthroplasty  02/05/11    left  . Tooth extraction    . Cholecystectomy       Current Outpatient Prescriptions  Medication Sig Dispense Refill  . acetaminophen (TYLENOL) 500 MG tablet Take 500 mg by mouth daily.    . Cholecalciferol 1000 UNITS tablet Take 1,000 Units by mouth daily.    Marland Kitchen diltiazem (CARDIZEM CD) 180 MG 24 hr capsule Take 1 capsule (180 mg total) by mouth daily. 30 capsule 11  . FLUoxetine (PROZAC) 10 MG capsule Take 10 mg by mouth daily.      Marland Kitchen OVER THE COUNTER MEDICATION at bedtime as needed. Sleep Aid    . rivaroxaban (XARELTO) 20 MG TABS tablet Take 1 tablet (20 mg total) by mouth daily with supper. 30 tablet 11  . simvastatin (ZOCOR) 20 MG tablet Take 1 tablet (20 mg total) by mouth at bedtime. 30 tablet 11   No  current facility-administered medications for this visit.    Allergies:   Review of patient's allergies indicates no known allergies.    Social History:  The patient  reports that she has never smoked. She has never used smokeless tobacco. She reports that she drinks about 6.0 oz of alcohol per week. She reports that she does not use illicit drugs.   Family History:  The patient's family history includes Cancer in her father; Diabetes in her mother; Heart attack in her mother; Heart disease in her mother and sister. There is no history of Breast cancer or Colon cancer.    ROS:  Please see the history of present illness.   Otherwise, review of systems are positive for none.   All other systems are reviewed and negative.    PHYSICAL EXAM: VS:  BP  130/74 mmHg  Pulse 52  Ht '5\' 2"'$  (1.575 m)  Wt 167 lb (75.751 kg)  BMI 30.54 kg/m2 , BMI Body mass index is 30.54 kg/(m^2). GEN: Well nourished, well developed, in no acute distress HEENT: normal Neck: no JVD, carotid bruits, or masses Cardiac: Irregularly irregular normal rate, at first regular bradycardic no murmurs, rubs, or gallops,no edema  Respiratory:  clear to auscultation bilaterally, normal work of breathing GI: soft, nontender, nondistended, + BS MS: no deformity or atrophy Skin: warm and dry, no rash Neuro:  Strength and sensation are intact Psych: euthymic mood, full affect   EKG:  EKG from 03/16/15 reviewed, atrial fibrillation with rapid ventricular response   Recent Labs: 03/16/2015: BUN 13; Creatinine, Ser 0.68; Hemoglobin 15.1*; Magnesium 2.1; Platelets 219; Potassium 3.9; Sodium 138    Lipid Panel    Component Value Date/Time   CHOL 179 09/25/2013 0920   TRIG 89 09/25/2013 0920   HDL 66 09/25/2013 0920   CHOLHDL 2.7 09/25/2013 0920   VLDL 18 09/25/2013 0920   LDLCALC 95 09/25/2013 0920      Wt Readings from Last 3 Encounters:  07/21/15 167 lb (75.751 kg)  05/19/15 164 lb 1.9 oz (74.444 kg)  04/07/15 167 lb 6.4 oz (75.932 kg)      Other studies Reviewed: Additional studies/ records that were reviewed today include: Office note, lab work, EKGs. Review of the above records demonstrates: As above   ASSESSMENT AND PLAN:  1.  Atrial fibrillation, persistent.  Asymptomatic, no significant bleeding.  But given prior rate 140s after missing just one dose, increasef to 180.   - continue anticoagulation with Xarelto. CHADS-VASc 3  - Continue with diltiazem CD, increased from '120mg'$  to '180mg'$  per day.  - Try to avoid stimulants, excessive caffeine, decongestants, excessive alcohol  2. Hyperlipidemia  - continue simvastatin 20 mg (lower dose on diltiazem)  3. Chronic anticoagulation  - Xarelto as above  Current medicines are reviewed at length with the  patient today.  The patient does not have concerns regarding medicines.  The following changes have been made:  As abvoe  Labs/ tests ordered today include:  No orders of the defined types were placed in this encounter.    Disposition:   FU with [facility]  in 6 months  Signed, Candee Furbish, MD  07/21/2015 8:48 AM    [facility] [place], [place], [place]  [address] Phone: [phone]; Fax: [phone]

## 2015-12-15 ENCOUNTER — Ambulatory Visit (INDEPENDENT_AMBULATORY_CARE_PROVIDER_SITE_OTHER): Payer: Medicare Other | Admitting: Physician Assistant

## 2015-12-15 ENCOUNTER — Encounter: Payer: Self-pay | Admitting: Physician Assistant

## 2015-12-15 VITALS — BP 140/82 | HR 145 | Ht 62.0 in | Wt 166.0 lb

## 2015-12-15 DIAGNOSIS — I480 Paroxysmal atrial fibrillation: Secondary | ICD-10-CM | POA: Diagnosis not present

## 2015-12-15 DIAGNOSIS — R0602 Shortness of breath: Secondary | ICD-10-CM

## 2015-12-15 DIAGNOSIS — Z7901 Long term (current) use of anticoagulants: Secondary | ICD-10-CM | POA: Diagnosis not present

## 2015-12-15 DIAGNOSIS — E785 Hyperlipidemia, unspecified: Secondary | ICD-10-CM

## 2015-12-15 DIAGNOSIS — I10 Essential (primary) hypertension: Secondary | ICD-10-CM

## 2015-12-15 MED ORDER — METOPROLOL TARTRATE 25 MG PO TABS: 25.0000 mg | ORAL_TABLET | Freq: Two times a day (BID) | ORAL | Status: DC

## 2015-12-15 NOTE — Progress Notes (Signed)
Cardiology Office Note    Date:  12/15/2015   ID:  ABHIGNA HOOLE, DOB [birth year], MRN [id number]  PCP:  Leeanne Rio, PA-C  Cardiologist: Dr. Marlou Porch  Chief Complaint  Patient presents with  . Follow-up  . Atrial Fibrillation  . Fatigue  . Shortness of Breath    History of Present Illness:  Sheila Schmidt is a 80 y.o. female  With history of atrial fibrillation with rapid ventricular response seen in emergency department on 03/16/15. She converted to normal sinus rhythm after IV diltiazem. Stable for discharge. Low-dose diltiazem and Xarelto.  Noted AFIB once or twice while sleeping. Went to see [name]. No CP, no SOB, no syncope, no bleeding. No Fever, no cough. She was there in the office, they wheeled her down to the emergency room for further evaluation. She spontaneously converted after IV diltiazem.  Last saw Dr. Marlou Porch 07/21/15. He increased her diltiazem to 180 mg daily.  Patient now says she has had trouble off and on since Father's Day in June. She was in [place] had and felt dyspnea on exertion, fatigue and some leg edema. It resolved but over the past few days she has felt fatigued again. She went walking for 45 minutes this morning and had to stop twice to catch her breath.    Past Medical History  Diagnosis Date  . Depression   . Hyperlipidemia   . Hx of adenomatous colonic polyps   . Cholelithiasis   . 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 Cancer (HCC)      Left breast carcinoma in situ    Past Surgical History  Procedure Laterality Date  . Breast lumpectomy  oct '10    Left  . Knee arthroscopy      Left '00 [place]/ Right '08  . Cholecystectomy, laparoscopic  '06    [place]  . Lumpectomy- remote      benign  . Total knee arthroplasty  02/05/11    left  . Tooth extraction    . Cholecystectomy      Current Medications: Outpatient Prescriptions Prior to Visit  Medication Sig Dispense Refill  . acetaminophen (TYLENOL)  500 MG tablet Take 500 mg by mouth daily.    . Cholecalciferol 1000 UNITS tablet Take 1,000 Units by mouth daily.    Marland Kitchen diltiazem (CARDIZEM CD) 180 MG 24 hr capsule Take 1 capsule (180 mg total) by mouth daily. 30 capsule 11  . FLUoxetine (PROZAC) 10 MG capsule Take 10 mg by mouth daily.      Marland Kitchen OVER THE COUNTER MEDICATION at bedtime as needed. Sleep Aid    . rivaroxaban (XARELTO) 20 MG TABS tablet Take 1 tablet (20 mg total) by mouth daily with supper. 30 tablet 11  . simvastatin (ZOCOR) 20 MG tablet Take 1 tablet (20 mg total) by mouth at bedtime. 30 tablet 11   No facility-administered medications prior to visit.     Allergies:   Review of patient's allergies indicates no known allergies.   Social History   Social History  . Marital Status: Married    Spouse Name: N/A  . Number of Children: 3  . Years of Education: N/A   Occupational History  . 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 Smoking status: Never Smoker   . Smokeless tobacco: Never Used  . Alcohol Use: 6.0 oz/week    10  Glasses of wine per week     Comment: daily  . Drug Use: No  . Sexual Activity: Not Asked   Other Topics Concern  . None   Social History Narrative   [employer]. [employer]. Married '59, 3 daughters- '61, '64, '73; 2 grandchildren.Marriage in good health. Exercise - goes to gym 5/wk. ACP - directed to the [email or web address]     Family History:  The patient's    family history includes Cancer in her father; Diabetes in her mother; Heart attack in her mother; Heart disease in her mother and sister. There is no history of Breast cancer or Colon cancer.   ROS:   Please see the history of present illness.    ROS All other systems reviewed and are negative.   PHYSICAL EXAM:   VS:  BP 140/82 mmHg  Pulse 145  Ht 5\' 2"  (1.575 m)  Wt 166 lb (75.297 kg)  BMI 30.35 kg/m2  Physical Exam  GEN: Well nourished, well developed, in no acute distress Neck:  no JVD, carotid bruits, or masses Cardiac: Irregular irregular; no murmurs, rubs, or gallops  Respiratory:  clear to auscultation bilaterally, normal work of breathing GI: soft, nontender, nondistended, + BS Ext: without cyanosis, clubbing, or edema, Good distal pulses bilaterally MS: no deformity or atrophy Skin: warm and dry, no rash Neuro:  Alert and Oriented x 3, Strength and sensation are intact Psych: euthymic mood, full affect  Wt Readings from Last 3 Encounters:  12/15/15 166 lb (75.297 kg)  07/21/15 167 lb (75.751 kg)  05/19/15 164 lb 1.9 oz (74.444 kg)      Studies/Labs Reviewed:   EKG:  EKG is ordered today.  The ekg ordered today demonstrates Atrial flutter at 145 bpm  Recent Labs: 03/16/2015: BUN 13; Creatinine, Ser 0.68; Hemoglobin 15.1*; Magnesium 2.1; Platelets 219; Potassium 3.9; Sodium 138   Lipid Panel    Component Value Date/Time   CHOL 179 09/25/2013 0920   TRIG 89 09/25/2013 0920   HDL 66 09/25/2013 0920   CHOLHDL 2.7 09/25/2013 0920   VLDL 18 09/25/2013 0920   LDLCALC 95 09/25/2013 0920    Additional studies/ records that were reviewed today include:   2Decho 04/2015 - Left ventricle: The cavity size was normal. Systolic function was   normal. The estimated ejection fraction was in the range of 50%   to 55%. Hypokinesis of the anteroseptal and inferoseptal   myocardium. The study is not technically sufficient to allow   evaluation of LV diastolic function. - Aortic valve: There was mild regurgitation. - Mitral valve: There was trivial regurgitation. - Left atrium: The atrium was moderately dilated. - Right ventricle: The cavity size was normal. Wall thickness was   normal. Systolic function was normal. - Pulmonic valve: There was mild regurgitation.  Overall reassuring function. Moderate dilated LA - AFIB.      ASSESSMENT:    1. SOB (shortness of breath)   2. Paroxysmal atrial fibrillation (HCC)   3. Chronic anticoagulation   4.  Essential hypertension, benign   5. Hyperlipidemia      PLAN:  In order of problems listed above:  Shortness of breath and rapid atrial fibrillation/ flutter discussed patient with Dr. Radford Pax. We will add low-dose metoprolol 25 mg twice a day. Patient is to take her heart rate at home and call if it does not slow down. She will return tomorrow for an EKG. I will see her back in 3-4 weeks. She has not missed any Xarelto doses so  potentially could have cardioversion but she's never needed this in the past. Follow-up with Dr. Luther Parody in 2 months.  Chronic anticoagulation with Xarelto has not missed any doses  Hypertension blood pressure stable to add metoprolol   hyperlipidemia continue Zocor      Medication Adjustments/Labs and Tests Ordered: Current medicines are reviewed at length with the patient today.  Concerns regarding medicines are outlined above.  Medication changes, Labs and Tests ordered today are listed in the Patient Instructions below. Patient Instructions  Medication Instructions:  Your physician has recommended you make the following change in your medication: START METOPROLOL 25 MB  TWICE  DAILY  Labwork:   NONE  Follow-Up: Your physician recommends that you schedule a follow-up appointment in:  NEXT AVAILABLE WITH  DR SKAINS  IF NOTHING  WITHIN  3-4 WEEKS   SEE  MICHELE  LENZE PA   NURSE VISIT TOMORROW  EKG   CHECK  HEART RATE   Any Other Special Instructions Will Be Listed Below (If Applicable).     If you need a refill on your cardiac medications before your next appointment, please call your pharmacy.       Sumner Boast, PA-C  12/15/2015 11:52 AM    [facility] [place], [place], [place]  [address] Phone: [phone]; Fax: [phone]

## 2015-12-15 NOTE — Patient Instructions (Addendum)
Medication Instructions:  Your physician has recommended you make the following change in your medication: START METOPROLOL 25 MB  TWICE  DAILY  Labwork:   NONE  Follow-Up: Your physician recommends that you schedule a follow-up appointment in:  NEXT AVAILABLE WITH  DR SKAINS  IF NOTHING  WITHIN  3-4 WEEKS   SEE  MICHELE  LENZE PA   NURSE VISIT TOMORROW  EKG   CHECK  HEART RATE   Any Other Special Instructions Will Be Listed Below (If Applicable).     If you need a refill on your cardiac medications before your next appointment, please call your pharmacy.

## 2015-12-16 ENCOUNTER — Ambulatory Visit (INDEPENDENT_AMBULATORY_CARE_PROVIDER_SITE_OTHER): Payer: Medicare Other | Admitting: Cardiovascular Disease

## 2015-12-16 VITALS — HR 68

## 2015-12-16 DIAGNOSIS — I4819 Other persistent atrial fibrillation: Secondary | ICD-10-CM

## 2015-12-16 DIAGNOSIS — I480 Paroxysmal atrial fibrillation: Secondary | ICD-10-CM | POA: Diagnosis not present

## 2015-12-16 DIAGNOSIS — I481 Persistent atrial fibrillation: Secondary | ICD-10-CM

## 2015-12-16 NOTE — Progress Notes (Signed)
  1.) Reason for visit: ekg  2.) Name of MD requesting visit: DR  TURNER    Pt remains in atrial fib. HR is slower after the addition of the metoprolol Continue current meds     Mertie Moores, MD  12/16/2015 5:17 PM    [facility] [place],  [place], [place]  [address] Pager [phone] Phone: [phone]; Fax: [phone]

## 2015-12-19 ENCOUNTER — Telehealth: Payer: Self-pay | Admitting: Cardiology

## 2015-12-19 DIAGNOSIS — R0602 Shortness of breath: Secondary | ICD-10-CM

## 2015-12-19 NOTE — Telephone Encounter (Signed)
New message    Pt c/o medication issue:  1. Name of Medication: Metoprolol 2. How are you currently taking this medication (dosage and times per day)? 25 mg po twice daily  3. Are you having a reaction (difficulty breathing--STAT)? yes  4. What is your medication issue? The pt seems to think she getting sob from the medication    Pt c/o Shortness Of Breath: STAT if SOB developed within the last 24 hours or pt is noticeably SOB on the phone   1. Are you currently SOB (can you hear that pt is SOB on the phone)? no  2. How long have you been experiencing SOB? Last week  3. Are you SOB when sitting or when up moving around? Sitting daown  4. Are you currently experiencing any other symptoms? no

## 2015-12-19 NOTE — Telephone Encounter (Signed)
Spoke with pt who is reporting since starting Metoprolol she has had a gradual increase in SOB.  Today when she went to the gym she was so SOB she had to sit down for about 10 mins before she could exercise.  She reports she does not feel like she is in At Fib and her HR was 70 bpm in the GXT this morning.  She has been taking Metoprolol 25 mg BID and is still on Diltiazem 180 mg a day.  Advised SOB is not a typical side effect we see with this medication and I will forward this information to Dr Marlou Porch for review and further recommendations.

## 2015-12-20 ENCOUNTER — Telehealth (HOSPITAL_COMMUNITY): Payer: Self-pay | Admitting: *Deleted

## 2015-12-20 MED ORDER — METOPROLOL TARTRATE 25 MG PO TABS: 25.0000 mg | ORAL_TABLET | Freq: Two times a day (BID) | ORAL | Status: DC | PRN

## 2015-12-20 NOTE — Telephone Encounter (Signed)
Stop newly added medicine metoprolol 25 mg twice a day. She may keep this intake 25 mg of metoprolol as needed if she feels her heart rate increased. Send her for consultation to atrial fibrillation clinic. EKG from 03/16/15 shows atrial fibrillation. Is likely that she has been fairly persistent with this and then recently has had difficulty with rapid ventricular response. Next may be adding antiarrhythmic.  Let's also set her up for a pharmacologic stress test to ensure that she does not have any signs of ischemia. This will also help [place] if she does need an antiarrhythmic in the future.  Candee Furbish, MD

## 2015-12-20 NOTE — Telephone Encounter (Signed)
Orders placed as instructed.  Called to review orders and instructions with patient however she was unavailable.  Reviewed orders and instructions with patient's husband (on [place]) who stated understanding.  Reviewed instructions for myoview in detail.  He will have the patient call back if any questions or concerns.  He is aware someone will be calling to schedule the appts for the myoview and At [facility] appt.

## 2015-12-20 NOTE — Telephone Encounter (Signed)
Left msg on pts mobile for her to call to sched appt.   Spoke to pts spouse and he will give her the msg to call us as well.

## 2015-12-20 NOTE — Telephone Encounter (Signed)
-----   Message from Jeanie Sewer sent at 12/20/2015 10:58 AM EDT ----- Regarding: FW: at fib clinic appt  Please schedule pt a appt with afib clinic.  Thank you Sonya  ----- Message -----    From: Shellia Cleverly, RN    Sent: 12/20/2015  10:45 AM      To: [facility] Subject: at fib clinic appt                             Pt has been referred to At [facility] and needs to be called to schedule an appt.  She also needs a Myoview - order placed and instructions given.  I couldn't find the pool to send a message to the At Fib clinic.  Do you just send a message to an individual person?  Thanks,  Pam

## 2015-12-21 ENCOUNTER — Telehealth (HOSPITAL_COMMUNITY): Payer: Self-pay | Admitting: *Deleted

## 2015-12-21 NOTE — Telephone Encounter (Signed)
Patient given detailed instructions per Myocardial Perfusion Study Information Sheet for the test on 12/22/15 at 10:00. Patient notified to arrive 15 minutes early and that it is imperative to arrive on time for appointment to keep from having the test rescheduled.  If you need to cancel or reschedule your appointment, please call the office within 24 hours of your appointment. Failure to do so may result in a cancellation of your appointment, and a $50 no show fee. Patient verbalized understanding.Sheila Schmidt

## 2015-12-22 ENCOUNTER — Ambulatory Visit (HOSPITAL_COMMUNITY): Payer: Medicare Other | Attending: Internal Medicine

## 2015-12-22 DIAGNOSIS — R5383 Other fatigue: Secondary | ICD-10-CM | POA: Diagnosis not present

## 2015-12-22 DIAGNOSIS — R9439 Abnormal result of other cardiovascular function study: Secondary | ICD-10-CM | POA: Insufficient documentation

## 2015-12-22 DIAGNOSIS — R0609 Other forms of dyspnea: Secondary | ICD-10-CM | POA: Insufficient documentation

## 2015-12-22 DIAGNOSIS — R0602 Shortness of breath: Secondary | ICD-10-CM | POA: Diagnosis not present

## 2015-12-22 LAB — MYOCARDIAL PERFUSION IMAGING
CHL CUP NUCLEAR SRS: 9
CHL CUP NUCLEAR SSS: 14
CSEPPHR: 109 {beats}/min
LHR: 0.24
Rest HR: 78 {beats}/min
SDS: 5
TID: 1.12

## 2015-12-22 MED ORDER — TECHNETIUM TC 99M TETROFOSMIN IV KIT
10.6000 | PACK | Freq: Once | INTRAVENOUS | Status: AC | PRN
  Administered 2015-12-22: 11 via INTRAVENOUS
  Filled 2015-12-22: qty 11

## 2015-12-22 MED ORDER — REGADENOSON 0.4 MG/5ML IV SOLN
0.4000 mg | Freq: Once | INTRAVENOUS | Status: AC
  Administered 2015-12-22: 0.4 mg via INTRAVENOUS

## 2015-12-22 MED ORDER — TECHNETIUM TC 99M TETROFOSMIN IV KIT
33.0000 | PACK | Freq: Once | INTRAVENOUS | Status: AC | PRN
  Administered 2015-12-22: 33 via INTRAVENOUS
  Filled 2015-12-22: qty 33

## 2015-12-23 ENCOUNTER — Telehealth: Payer: Self-pay | Admitting: Cardiology

## 2015-12-23 NOTE — Telephone Encounter (Signed)
Reviewed results of myoview with patient who states understanding.  She will return to normal activities but knows to call back with any concerns.

## 2015-12-23 NOTE — Telephone Encounter (Signed)
F/u Message   Pt returning RN call about test results. Please call back to discuss

## 2016-01-03 ENCOUNTER — Ambulatory Visit (HOSPITAL_COMMUNITY)
Admission: RE | Admit: 2016-01-03 | Discharge: 2016-01-03 | Disposition: A | Discharge status: Home / Self Care | Payer: Medicare Other | Source: Ambulatory Visit | Attending: Nurse Practitioner | Admitting: Nurse Practitioner

## 2016-01-03 ENCOUNTER — Encounter (HOSPITAL_COMMUNITY): Payer: Self-pay | Admitting: Nurse Practitioner

## 2016-01-03 ENCOUNTER — Other Ambulatory Visit: Payer: Self-pay

## 2016-01-03 VITALS — BP 156/80 | HR 107 | Ht 62.0 in | Wt 165.2 lb

## 2016-01-03 DIAGNOSIS — I480 Paroxysmal atrial fibrillation: Secondary | ICD-10-CM | POA: Diagnosis not present

## 2016-01-03 DIAGNOSIS — Z7901 Long term (current) use of anticoagulants: Secondary | ICD-10-CM | POA: Insufficient documentation

## 2016-01-03 DIAGNOSIS — I4819 Other persistent atrial fibrillation: Secondary | ICD-10-CM

## 2016-01-03 DIAGNOSIS — Z79899 Other long term (current) drug therapy: Secondary | ICD-10-CM | POA: Insufficient documentation

## 2016-01-03 DIAGNOSIS — Z833 Family history of diabetes mellitus: Secondary | ICD-10-CM | POA: Diagnosis not present

## 2016-01-03 DIAGNOSIS — F329 Major depressive disorder, single episode, unspecified: Secondary | ICD-10-CM | POA: Diagnosis not present

## 2016-01-03 DIAGNOSIS — E785 Hyperlipidemia, unspecified: Secondary | ICD-10-CM | POA: Insufficient documentation

## 2016-01-03 DIAGNOSIS — I481 Persistent atrial fibrillation: Secondary | ICD-10-CM | POA: Diagnosis present

## 2016-01-03 DIAGNOSIS — M170 Bilateral primary osteoarthritis of knee: Secondary | ICD-10-CM | POA: Insufficient documentation

## 2016-01-03 DIAGNOSIS — Z8249 Family history of ischemic heart disease and other diseases of the circulatory system: Secondary | ICD-10-CM | POA: Insufficient documentation

## 2016-01-03 NOTE — Progress Notes (Signed)
Patient ID: Sheila Schmidt, female   DOB: [birth year], 80 y.o.   MRN: [id number]     Primary Care Physician: Leeanne Rio, PA-C Referring Physician: Dr. Richardo Priest Bialy is a 80 y.o. female with a h/o persistent afib that was newly diagnosed in October 2016 when she presented to PCP for an exam. She was unaware that she was in afib. She was sent to the ER and converted with cardizem.  She was placed on xarelto for a chadsvasc score of at least 2. She was found to be in aflutter early July and metoprolol was added. She did  Have slowing of HR, but did not tolerate the metoprolol. She is being seen today to determine further treatment of her afib.  She denies that she is aware that she is in afib. She feels great. EKG shows undetermined  Rhythm, grouped beats.. On blood thinner with pt tolerating, no missed does. States that she goes to the gym 5x a week. Does drink two glasses of wine a night. Denies snoring.  Today, she denies symptoms of palpitations, chest pain, shortness of breath, orthopnea, PND, lower extremity edema, dizziness, presyncope, syncope, or neurologic sequela. The patient is tolerating medications without difficulties and is otherwise without complaint today.   Past Medical History:  Diagnosis Date  . Cancer ([place])     Left breast carcinoma in situ  . Cholelithiasis   . Depression   . 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 Hx of adenomatous colonic polyps   . Hyperlipidemia    Past Surgical History:  Procedure Laterality Date  . BREAST LUMPECTOMY  oct '10   Left  . CHOLECYSTECTOMY    . CHOLECYSTECTOMY, LAPAROSCOPIC  '06   [place]  . KNEE ARTHROSCOPY     Left '00 [facility]/ Right '08  . lumpectomy- remote     benign  . TOOTH EXTRACTION    . TOTAL KNEE ARTHROPLASTY  02/05/11   left    Current Outpatient Prescriptions  Medication Sig Dispense Refill  . acetaminophen (TYLENOL) 500 MG tablet Take 500 mg by mouth daily.    .  Cholecalciferol 1000 UNITS tablet Take 1,000 Units by mouth daily.    Marland Kitchen diltiazem (CARDIZEM CD) 180 MG 24 hr capsule Take 1 capsule (180 mg total) by mouth daily. 30 capsule 11  . FLUoxetine (PROZAC) 10 MG capsule Take 10 mg by mouth daily.      . metoprolol tartrate (LOPRESSOR) 25 MG tablet Take 1 tablet (25 mg total) by mouth 2 (two) times daily as needed. 180 tablet 3  . OVER THE COUNTER MEDICATION at bedtime as needed. Sleep Aid    . rivaroxaban (XARELTO) 20 MG TABS tablet Take 1 tablet (20 mg total) by mouth daily with supper. 30 tablet 11  . simvastatin (ZOCOR) 20 MG tablet Take 1 tablet (20 mg total) by mouth at bedtime. 30 tablet 11   No current facility-administered medications for this encounter.     No Known Allergies  Social History   Social History  . Marital status: Married    Spouse name: N/A  . Number of children: 3  . Years of education: N/A   Occupational History  . 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 Smoking status: Never Smoker  . Smokeless tobacco: Never Used  . Alcohol use 6.0 oz/week    10 Glasses of wine per  week     Comment: daily  . Drug use: No  . Sexual activity: Not on file   Other Topics Concern  . Not on file   Social History Narrative   [employer]. [employer]. Married '59, 3 daughters- '61, '64, '73; 2 grandchildren.Marriage in good health. Exercise - goes to gym 5/wk. ACP - directed to the [email or web address]    Family History  Problem Relation Age of Onset  . Diabetes Mother     Deceased in 12s  . Heart disease Mother     cad/MI-fatal  . Breast cancer Neg Hx   . Colon cancer Neg Hx   . Cancer Father     liver, Deceased in 40s  . Heart attack Mother   . Heart disease Sister     ROS- All systems are reviewed and negative except as per the HPI above  Physical Exam: Vitals:   01/03/16 1102  BP: (!) 156/80  Pulse: (!) 107  Weight: 165 lb 3.2 oz (74.9 kg)  Height: 5\' 2"  (1.575  m)    GEN- The patient is well appearing, alert and oriented x 3 today.   Head- normocephalic, atraumatic Eyes-  Sclera clear, conjunctiva pink Ears- hearing intact Oropharynx- clear Neck- supple, no JVP Lymph- no cervical lymphadenopathy Lungs- Clear to ausculation bilaterally, normal work of breathing Heart- irregular/ regular rate and rhythm, no murmurs, rubs or gallops, PMI not laterally displaced GI- soft, NT, ND, + BS Extremities- no clubbing, cyanosis, or edema MS- no significant deformity or atrophy Skin- no rash or lesion Psych- euthymic mood, full affect Neuro- strength and sensation are intact  EKG- Undetermined rhythm with regular  grouped beats, Qrs int 114 ms, qtc 467 ms , v rate of 107 bpm  Echo- 04/2015- Left ventricle: The cavity size was normal. Systolic function was normal. The estimated ejection fraction was in the range of 50% to 55%. Hypokinesis of the anteroseptal and inferoseptal myocardium. The study is not technically sufficient to allow evaluation of LV diastolic function. - Aortic valve: There was mild regurgitation. - Mitral valve: There was trivial regurgitation. - Left atrium: The atrium was moderately dilated. - Right ventricle: The cavity size was normal. Wall thickness was normal. Systolic function was normal. - Pulmonic valve: There was mild regurgitation.  Overall reassuring function. Moderate dilated LA - AFIB.    Lexi myoview-12/22/15-There was no ST segment deviation noted during stress.  This is a low risk study. Mild anteroseptal decreased radiotracer uptake seen at both rest and stress consistent with left bundle branch block artifact. No ischemia identified. Atrial fibrillation-non-gated study, no ejection fraction calculated.  Assessment and Plan: 1.Persisitent afib Pt is asymptomatic and I am unsure of rhythm today Will place a 48 hour monitor on pt  and see back in 2 weeks Will further review with pt use of AAD's at  that time Right now, she does not desire change in therapy For now continue diltazem  Continue xarelto Cut back on wine intake to 2 glasses a week(pt not happy about this)  Geroge Baseman. Kamaiyah Uselton, [facility] [address] [place], [place] [address] [phone]

## 2016-01-09 ENCOUNTER — Ambulatory Visit (INDEPENDENT_AMBULATORY_CARE_PROVIDER_SITE_OTHER): Payer: Medicare Other

## 2016-01-09 DIAGNOSIS — I480 Paroxysmal atrial fibrillation: Secondary | ICD-10-CM | POA: Diagnosis not present

## 2016-01-10 ENCOUNTER — Ambulatory Visit: Payer: Medicare Other | Admitting: Physician Assistant

## 2016-01-19 ENCOUNTER — Ambulatory Visit (HOSPITAL_COMMUNITY)
Admission: RE | Admit: 2016-01-19 | Discharge: 2016-01-19 | Disposition: A | Discharge status: Home / Self Care | Payer: Medicare Other | Source: Ambulatory Visit | Attending: Nurse Practitioner | Admitting: Nurse Practitioner

## 2016-01-19 ENCOUNTER — Encounter (HOSPITAL_COMMUNITY): Payer: Self-pay | Admitting: Nurse Practitioner

## 2016-01-19 VITALS — BP 142/80 | HR 97 | Ht 62.0 in | Wt 168.0 lb

## 2016-01-19 DIAGNOSIS — E785 Hyperlipidemia, unspecified: Secondary | ICD-10-CM | POA: Diagnosis not present

## 2016-01-19 DIAGNOSIS — Z87898 Personal history of other specified conditions: Secondary | ICD-10-CM | POA: Diagnosis not present

## 2016-01-19 DIAGNOSIS — Z7901 Long term (current) use of anticoagulants: Secondary | ICD-10-CM | POA: Diagnosis not present

## 2016-01-19 DIAGNOSIS — I4819 Other persistent atrial fibrillation: Secondary | ICD-10-CM

## 2016-01-19 DIAGNOSIS — I481 Persistent atrial fibrillation: Secondary | ICD-10-CM

## 2016-01-19 DIAGNOSIS — I482 Chronic atrial fibrillation: Secondary | ICD-10-CM | POA: Diagnosis not present

## 2016-01-19 DIAGNOSIS — Z833 Family history of diabetes mellitus: Secondary | ICD-10-CM | POA: Diagnosis not present

## 2016-01-19 DIAGNOSIS — Z8249 Family history of ischemic heart disease and other diseases of the circulatory system: Secondary | ICD-10-CM | POA: Diagnosis not present

## 2016-01-19 DIAGNOSIS — Z79899 Other long term (current) drug therapy: Secondary | ICD-10-CM | POA: Diagnosis not present

## 2016-01-19 DIAGNOSIS — I4891 Unspecified atrial fibrillation: Secondary | ICD-10-CM | POA: Diagnosis present

## 2016-01-19 DIAGNOSIS — F329 Major depressive disorder, single episode, unspecified: Secondary | ICD-10-CM | POA: Insufficient documentation

## 2016-01-19 NOTE — Progress Notes (Signed)
Patient ID: Sheila Schmidt, female   DOB: 08-17-1935, 80 y.o.   MRN: [id number]     Primary Care Physician: Leeanne Rio, PA-C Referring Physician: Dr. Acie Fredrickson Cardiologist: Dr. Richardo Priest Munnerlyn is a 80 y.o. female with a h/o persistent afib that was newly diagnosed in October 2016 when she presented to PCP for an exam. She was unaware that she was in afib. She was sent to the ER and converted with cardizem.  She was placed on xarelto for a chadsvasc score of at least 2. She was in afib, when seen by Dr. Marlou Porch 2/17, and cardizem was increased. She was found to be in aflutter early July and metoprolol was added. She did  have slowing of HR, but did not tolerate the metoprolol. Seen today, 7/25 to determine further treatment of her afib.  She denies that she is aware that she is in afib. She feels great. EKG shows undetermined  Rhythm, grouped beats.. On blood thinner with pt tolerating, no missed does. States that she goes to the gym 5x a week. Does drink two glasses of wine a night. Denies snoring. Encouraged to decrease alcohol intake. 48 hour monitor showed afib, rate controlled.  Returns 8/10 and results of monitor is reviewed with pt and she still states that she feels well in afib and does not want to pursue further treatment. Cardioversion offered and /or antiarrythmic's. and she is happy with current status.  Today, she denies symptoms of palpitations, chest pain, shortness of breath, orthopnea, PND, lower extremity edema, dizziness, presyncope, syncope, or neurologic sequela. The patient is tolerating medications without difficulties and is otherwise without complaint today.   Past Medical History:  Diagnosis Date  . Cancer ([place])     Left breast carcinoma in situ  . Cholelithiasis   . Depression   . 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 Hx of adenomatous colonic polyps   . Hyperlipidemia    Past Surgical History:  Procedure Laterality Date  .  BREAST LUMPECTOMY  oct '10   Left  . CHOLECYSTECTOMY    . CHOLECYSTECTOMY, LAPAROSCOPIC  '06   [place]  . KNEE ARTHROSCOPY     Left '00 [facility]/ Right '08  . lumpectomy- remote     benign  . TOOTH EXTRACTION    . TOTAL KNEE ARTHROPLASTY  02/05/11   left    Current Outpatient Prescriptions  Medication Sig Dispense Refill  . acetaminophen (TYLENOL) 500 MG tablet Take 500 mg by mouth daily.    . Cholecalciferol 1000 UNITS tablet Take 1,000 Units by mouth daily.    Marland Kitchen diltiazem (CARDIZEM CD) 180 MG 24 hr capsule Take 1 capsule (180 mg total) by mouth daily. 30 capsule 11  . FLUoxetine (PROZAC) 10 MG capsule Take 10 mg by mouth daily.      . metoprolol tartrate (LOPRESSOR) 25 MG tablet Take 1 tablet (25 mg total) by mouth 2 (two) times daily as needed. 180 tablet 3  . OVER THE COUNTER MEDICATION at bedtime as needed. Sleep Aid    . rivaroxaban (XARELTO) 20 MG TABS tablet Take 1 tablet (20 mg total) by mouth daily with supper. 30 tablet 11  . simvastatin (ZOCOR) 20 MG tablet Take 1 tablet (20 mg total) by mouth at bedtime. 30 tablet 11   No current facility-administered medications for this encounter.     No Known Allergies  Social History   Social History  . Marital status: Married  Spouse name: N/A  . Number of children: 3  . Years of education: N/A   Occupational History  . 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 Smoking status: Never Smoker  . Smokeless tobacco: Never Used  . Alcohol use 6.0 oz/week    10 Glasses of wine per week     Comment: daily  . Drug use: No  . Sexual activity: Not on file   Other Topics Concern  . Not on file   Social History Narrative   [employer]. HS[employer]. Married '59, 3 daughters- '61, '64, '73; 2 grandchildren.Marriage in good health. Exercise - goes to gym 5/wk. ACP - directed to the [email or web address]    Family History  Problem Relation Age of Onset  . Diabetes Mother     Deceased  in 14s  . Heart disease Mother     cad/MI-fatal  . Breast cancer Neg Hx   . Colon cancer Neg Hx   . Cancer Father     liver, Deceased in 69s  . Heart attack Mother   . Heart disease Sister     ROS- All systems are reviewed and negative except as per the HPI above  Physical Exam: Vitals:   01/19/16 1415  BP: (!) 142/80  Pulse: 97  Weight: 168 lb (76.2 kg)  Height: 5\' 2"  (1.575 m)    GEN- The patient is well appearing, alert and oriented x 3 today.   Head- normocephalic, atraumatic Eyes-  Sclera clear, conjunctiva pink Ears- hearing intact Oropharynx- clear Neck- supple, no JVP Lymph- no cervical lymphadenopathy Lungs- Clear to ausculation bilaterally, normal work of breathing Heart- irregular/ regular rate and rhythm, no murmurs, rubs or gallops, PMI not laterally displaced GI- soft, NT, ND, + BS Extremities- no clubbing, cyanosis, or edema MS- no significant deformity or atrophy Skin- no rash or lesion Psych- euthymic mood, full affect Neuro- strength and sensation are intact  EKG-  Afib/flutter v rate 97 bpm, qrs int 116 ms, qtc 462 ms  Holter monitor-Notes Recorded by Jerline Pain, MD on 01/18/2016 at 7:02 AM EDT  Atrial fibrillation / flutter noted with variable conduction  Mean heart rate 99 BPM  No pauses greater than 3 seconds  PVC's occasional (240) noted  Rate controlled atrial fibrillation.  Candee Furbish, MD  Echo- 04/2015- Left ventricle: The cavity size was normal. Systolic function was normal. The estimated ejection fraction was in the range of 50% to 55%. Hypokinesis of the anteroseptal and inferoseptal myocardium. The study is not technically sufficient to allow evaluation of LV diastolic function. - Aortic valve: There was mild regurgitation. - Mitral valve: There was trivial regurgitation. - Left atrium: The atrium was moderately dilated. - Right ventricle: The cavity size was normal. Wall thickness was normal. Systolic function  was normal. - Pulmonic valve: There was mild regurgitation.  Overall reassuring function. Moderate dilated LA - AFIB.    Lexi myoview-12/22/15-There was no ST segment deviation noted during stress.  This is a low risk study. Mild anteroseptal decreased radiotracer uptake seen at both rest and stress consistent with left bundle branch block artifact. No ischemia identified. Atrial fibrillation-non-gated study, no ejection fraction calculated.  Assessment and Plan: 1.Chronic  afib Pt is asymptomatic Monitor shows reasonable rate control Defers  Cardioversion or AAD's Right now, she does not desire change in therapy Continue diltazem  Continue xarelto   F/u with Dr. Marlou Porch in 6-8 weeks  Geroge Baseman.  Faustine Tates, [facility] [address] [place], [place] [address] [phone]

## 2016-01-23 ENCOUNTER — Encounter: Payer: Self-pay | Admitting: Physician Assistant

## 2016-01-23 ENCOUNTER — Ambulatory Visit (INDEPENDENT_AMBULATORY_CARE_PROVIDER_SITE_OTHER): Payer: Medicare Other | Admitting: Physician Assistant

## 2016-01-23 VITALS — BP 103/72 | HR 72 | Temp 98.0°F | Resp 16 | Ht 62.0 in | Wt 165.4 lb

## 2016-01-23 DIAGNOSIS — C4491 Basal cell carcinoma of skin, unspecified: Secondary | ICD-10-CM

## 2016-01-23 DIAGNOSIS — S46211A Strain of muscle, fascia and tendon of other parts of biceps, right arm, initial encounter: Secondary | ICD-10-CM

## 2016-01-23 DIAGNOSIS — H6121 Impacted cerumen, right ear: Secondary | ICD-10-CM

## 2016-01-23 DIAGNOSIS — S46111A Strain of muscle, fascia and tendon of long head of biceps, right arm, initial encounter: Secondary | ICD-10-CM

## 2016-01-23 NOTE — Progress Notes (Signed)
Patient presents to clinic today with multiple complaints.  Patient endorses a sensation of right ear fullness and clogged feeling. Endorses occasional ringing in the ears with decreased hearing. Denies drainage from the ear. Denies ear pain or URI symptoms. Denies symptoms of left ear.  Patient also endorses of pain in lateral right bicep intermittently over the past 6 months. Patient states the pain only occurs when she is lifting weights above her head. Pain is aching in nature and pulling. Denies radiation of pain. Denies swelling, numbness, tingling of extremities. Has not taken anything for symptoms. Patient states pain is not reproduced if she uses light weights.  Patient also complains of a nonpainful, nonpruritic, raised skin lesion of chest, just superior and medial to the left breast. First noticed lesion 2 months ago. Denies history of skin cancer.    Past Medical History:  Diagnosis Date  . Cancer ([place])     Left breast carcinoma in situ  . Cholelithiasis   . Depression   . 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 Hx of adenomatous colonic polyps   . Hyperlipidemia     Current Outpatient Prescriptions on File Prior to Visit  Medication Sig Dispense Refill  . acetaminophen (TYLENOL) 500 MG tablet Take 500 mg by mouth daily.    . Cholecalciferol 1000 UNITS tablet Take 1,000 Units by mouth daily.    Marland Kitchen diltiazem (CARDIZEM CD) 180 MG 24 hr capsule Take 1 capsule (180 mg total) by mouth daily. 30 capsule 11  . FLUoxetine (PROZAC) 10 MG capsule Take 10 mg by mouth daily.      . metoprolol tartrate (LOPRESSOR) 25 MG tablet Take 1 tablet (25 mg total) by mouth 2 (two) times daily as needed. 180 tablet 3  . OVER THE COUNTER MEDICATION at bedtime as needed. Sleep Aid    . rivaroxaban (XARELTO) 20 MG TABS tablet Take 1 tablet (20 mg total) by mouth daily with supper. 30 tablet 11  . simvastatin (ZOCOR) 20 MG tablet Take 1 tablet (20 mg total) by mouth at bedtime. 30  tablet 11   No current facility-administered medications on file prior to visit.     No Known Allergies  Family History  Problem Relation Age of Onset  . Diabetes Mother     Deceased in 15s  . Heart disease Mother     cad/MI-fatal  . Breast cancer Neg Hx   . Colon cancer Neg Hx   . Cancer Father     liver, Deceased in 27s  . Heart attack Mother   . Heart disease Sister     Social History   Social History  . Marital status: Married    Spouse name: N/A  . Number of children: 3  . Years of education: N/A   Occupational History  . 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 Smoking status: Never Smoker  . Smokeless tobacco: Never Used  . Alcohol use 6.0 oz/week    10 Glasses of wine per week     Comment: daily  . Drug use: No  . Sexual activity: Not Asked   Other Topics Concern  . None   Social History Narrative   [employer]. HS[employer]. Married '59, 3 daughters- '61, '64, '73; 2 grandchildren.Marriage in good health. Exercise - goes to gym 5/wk. ACP - directed to the [email or web address]   Review of Systems - See HPI.  All other ROS are negative.  BP (!) 0/0 (BP Location: Left Arm, Patient Position: Sitting, Cuff Size: Normal)   Temp 98 F (36.7 C) (Oral)   Ht 5\' 2"  (1.575 m)   Wt 165 lb 6 oz (75 kg)   BMI 30.25 kg/m   Physical Exam  Constitutional: She is oriented to person, place, and time and well-developed, well-nourished, and in no distress.  HENT:  Head: Normocephalic and atraumatic.  Right Ear: External ear normal.  Left Ear: Tympanic membrane, external ear and ear canal normal.  Cerumen impaction noted of R ear canal. Exam otherwise unremarkable.  Eyes: Conjunctivae are normal.  Neck: Neck supple.  Cardiovascular: Normal rate, regular rhythm, normal heart sounds and intact distal pulses.   Pulmonary/Chest: Effort normal and breath sounds normal. No respiratory distress. She has no wheezes. She has no  rales. She exhibits no tenderness.  Musculoskeletal:       Right shoulder: Normal.       Right elbow: Normal.      Right upper arm: She exhibits no tenderness, no bony tenderness, no swelling, no edema, no deformity and no laceration.  Neurological: She is alert and oriented to person, place, and time.  Skin: Skin is warm and dry. No rash noted.     Psychiatric: Affect normal.  Vitals reviewed.   Recent Results (from the past 2160 hour(s))  Myocardial Perfusion Imaging     Status: None   Collection Time: 12/22/15 12:12 PM  Result Value Ref Range   Rest HR 78 bpm   Rest BP 114/76 mmHg   Exercise duration (min)  min   Exercise duration (sec)  sec   Estimated workload  METS   Peak HR 109 bpm   Peak BP 110/68 mmHg   MPHR  bpm   Percent HR  %   RPE     LV sys vol  mL   TID 1.12    LV dias vol  46 - 106 mL   LHR 0.24    SSS 14    SRS 9    SDS 5    Assessment/Plan: 1. Cerumen impaction, right Removed via irrigation. Tolerated well. Resolution of all symptoms with ear wax removal. Supportive measures reviewed. FU if any recurrence of symptoms.  2. Strain of right biceps, initial encounter No pain reproduced with examination. Supportive measures discussed. She is to limit weights while doing overhead resistance training. FU if not improving.  3. Basal cell carcinoma Noted on exam. Urgent referral to Dermatology placed.  - Ambulatory referral to Dermatology   Leeanne Rio, PA-C

## 2016-01-23 NOTE — Patient Instructions (Signed)
Please keep your phone on. You will be contacted by Dermatology for assessment and removal of the skin cancer. If you do not hear from them by Wednesday, please call me.  For the arm -- I am glad symptoms are rare. Limit the amount of weight you are lifted over the head as it is straining the biceps. Topical Icy hot or Aspercreme will be beneficial. Please let me know if symptoms worsen or become more frequent.  For the ear -- the impaction has been removed. Avoid using Qtips as this pushes the was deeper in the ear canal. Follow-up if any symptoms recur.

## 2016-01-24 ENCOUNTER — Other Ambulatory Visit: Payer: Self-pay | Admitting: Obstetrics & Gynecology

## 2016-01-25 LAB — CYTOLOGY - PAP

## 2016-02-15 ENCOUNTER — Encounter: Payer: Self-pay | Admitting: Cardiology

## 2016-02-20 ENCOUNTER — Ambulatory Visit (INDEPENDENT_AMBULATORY_CARE_PROVIDER_SITE_OTHER): Payer: Medicare Other | Admitting: Cardiology

## 2016-02-20 ENCOUNTER — Encounter: Payer: Self-pay | Admitting: Cardiology

## 2016-02-20 VITALS — BP 116/60 | HR 64 | Ht 62.0 in | Wt 168.6 lb

## 2016-02-20 DIAGNOSIS — I482 Chronic atrial fibrillation, unspecified: Secondary | ICD-10-CM

## 2016-02-20 DIAGNOSIS — Z7901 Long term (current) use of anticoagulants: Secondary | ICD-10-CM | POA: Diagnosis not present

## 2016-02-20 DIAGNOSIS — E785 Hyperlipidemia, unspecified: Secondary | ICD-10-CM

## 2016-02-20 MED ORDER — DILTIAZEM HCL ER COATED BEADS 180 MG PO CP24: 180.0000 mg | ORAL_CAPSULE | Freq: Every day | ORAL | 3 refills | Status: DC

## 2016-02-20 MED ORDER — RIVAROXABAN 20 MG PO TABS: 20.0000 mg | ORAL_TABLET | Freq: Every day | ORAL | 3 refills | Status: DC

## 2016-02-20 NOTE — Patient Instructions (Signed)

## 2016-02-20 NOTE — Progress Notes (Signed)
Cardiology Office Note   Date:  02/20/2016   ID:  Sheila Schmidt, DOB 03-20-1936, MRN [id number]  PCP:  Sheila Rio, PA-C  Cardiologist:   Sheila Furbish, MD       History of Present Illness: Sheila Schmidt is a 80 y.o. female who presents for follow up of atrial fibrillation with rapid ventricular response seen in emergency department on 03/16/15. She converted to normal sinus rhythm after IV diltiazem. Stable for discharge. Low-dose diltiazem and Xarelto.  Noted AFIB once or twice while sleeping. Went to see Sheila Schmidt. No CP, no SOB, no syncope, no bleeding. No Fever, no cough. She was there in the office, they wheeled her down to the emergency room for further evaluation. She spontaneously converted after IV diltiazem.  She has not felt any atrial fibrillation since then. Denies chest pain, palpitations, lightheaded, syncope, orthopnea, PND, leg swelling, easy bruising.  Dyspneic when she climbs one flight of stairs at the gym.  Goes to gym (spin once per week, pilates/yoga once per week, weights twice per week, walking/biking once per week, golf once per week) without dyspnea.  Since it has been cold she occasionally notices blood on tissue when she blows her nose in AM; she has noticed this in the past during the winter, before she was on Xarelto.    At prior visit HR 140s in office and she is asymptomatic. She says she has not yet taken diltiazem today because she has not yet eaten breakfast.  She normally takes it with breakfast around 8AM.  07/21/15-overall she is doing well. No further palpitations since increasing her diltiazem. Unfortunately, her Xarelto first prescription was $370. This was until she met her deductible of $400. No bleeding, no syncope, no chest pain. She plays golf, goes to the gym 5 days a week. Her right knee is bothering her. Osteoarthritis. Left knee replacement in the past.  02/20/16-overall Sheila Schmidt is doing very well. Sheila Schmidt saw Sheila Schmidt in the atrial  fibrillation clinic. Opted for deferring cardioversion or antiarrhythmics. She's doing well on diltiazem. She is no longer on metoprolol. States that she has been married for 59 years.   Past Medical History:  Diagnosis Date  . Cancer ([place])     Left breast carcinoma in situ  . Cholelithiasis   . Depression   . 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 Hx of adenomatous colonic polyps   . Hyperlipidemia     Past Surgical History:  Procedure Laterality Date  . BREAST LUMPECTOMY  oct '10   Left  . CHOLECYSTECTOMY    . CHOLECYSTECTOMY, LAPAROSCOPIC  '06   [place]  . KNEE ARTHROSCOPY     Left '00 [facility]/ Right '08  . lumpectomy- remote     benign  . TOOTH EXTRACTION    . TOTAL KNEE ARTHROPLASTY  02/05/11   left     Current Outpatient Prescriptions  Medication Sig Dispense Refill  . acetaminophen (TYLENOL) 500 MG tablet Take 500 mg by mouth daily.    . Cholecalciferol 1000 UNITS tablet Take 1,000 Units by mouth daily.    Marland Kitchen diltiazem (CARDIZEM CD) 180 MG 24 hr capsule Take 1 capsule (180 mg total) by mouth daily. 90 capsule 3  . FLUoxetine (PROZAC) 10 MG capsule Take 10 mg by mouth daily.      Marland Kitchen OVER THE COUNTER MEDICATION at bedtime as needed. Sleep Aid    . rivaroxaban (XARELTO) 20 MG TABS tablet Take 1  tablet (20 mg total) by mouth daily with supper. 90 tablet 3  . simvastatin (ZOCOR) 20 MG tablet Take 1 tablet (20 mg total) by mouth at bedtime. 30 tablet 11   No current facility-administered medications for this visit.     Allergies:   Review of patient's allergies indicates no known allergies.    Social History:  The patient  reports that she has never smoked. She has never used smokeless tobacco. She reports that she drinks about 6.0 oz of alcohol per week . She reports that she does not use drugs.   Family History:  The patient's family history includes Cancer in her father; Diabetes in her mother; Heart attack in her mother; Heart disease in her mother  and sister.    ROS:  Please see the history of present illness.   Otherwise, review of systems are positive for none.   All other systems are reviewed and negative.    PHYSICAL EXAM: VS:  BP 116/60   Pulse 64   Ht '5\' 2"'$  (1.575 m)   Wt 168 lb 9.6 oz (76.5 kg)   BMI 30.84 kg/m  , BMI Body mass index is 30.84 kg/m. GEN: Well nourished, well developed, in no acute distress  HEENT: normal  Neck: no JVD, carotid bruits, or masses Cardiac: Irregularly irregular normal rate, at first regular bradycardic no murmurs, rubs, or gallops,no edema  Respiratory:  clear to auscultation bilaterally, normal work of breathing GI: soft, nontender, nondistended, + BS MS: no deformity or atrophy  Skin: warm and dry, no rash Neuro:  Strength and sensation are intact Psych: euthymic mood, full affect   EKG:  EKG from 03/16/15 reviewed, atrial fibrillation with rapid ventricular response   Recent Labs: 03/16/2015: BUN 13; Creatinine, Ser 0.68; Hemoglobin 15.1; Magnesium 2.1; Platelets 219; Potassium 3.9; Sodium 138    Lipid Panel    Component Value Date/Time   CHOL 179 09/25/2013 0920   TRIG 89 09/25/2013 0920   HDL 66 09/25/2013 0920   CHOLHDL 2.7 09/25/2013 0920   VLDL 18 09/25/2013 0920   LDLCALC 95 09/25/2013 0920      Wt Readings from Last 3 Encounters:  02/20/16 168 lb 9.6 oz (76.5 kg)  01/23/16 165 lb 6 oz (75 kg)  01/19/16 168 lb (76.2 kg)      Other studies Reviewed: Additional studies/ records that were reviewed today include: Office note, lab work, EKGs. Review of the above records demonstrates: As above   ASSESSMENT AND PLAN:  1.  Atrial fibrillation, Chronic.  Asymptomatic, no significant bleeding.  But given prior rate 140s after missing just one dose, increased to 180. Metoprolol, low-dose also added.  - continue anticoagulation with Xarelto. CHADS-VASc 3  - Continue with diltiazem CD, increased from '120mg'$  to '180mg'$  per day.  - Try to avoid stimulants, excessive  caffeine, decongestants, excessive alcohol  - She has seen atrial fibrillation clinic and she defers any further cardioversion or antiarrhythmic. No change in therapy.  2. Hyperlipidemia  - continue simvastatin 20 mg (lower dose on diltiazem)  3. Chronic anticoagulation  - Xarelto as above  Current medicines are reviewed at length with the patient today.  The patient does not have concerns regarding medicines.  The following changes have been made:  As abvoe  Labs/ tests ordered today include:  No orders of the defined types were placed in this encounter.   Disposition:   FU with Sheila Schmidt  in 6 months  Signed, Sheila Furbish, MD  02/20/2016 4:10 PM  [facility] [place], [place], [place]  [address] Phone: [phone]; Fax: [phone]

## 2016-02-22 ENCOUNTER — Encounter: Payer: Self-pay | Admitting: Physician Assistant

## 2016-04-25 ENCOUNTER — Other Ambulatory Visit: Payer: Self-pay | Admitting: Cardiology

## 2016-04-26 ENCOUNTER — Encounter: Payer: Self-pay | Admitting: Family Medicine

## 2016-04-26 ENCOUNTER — Ambulatory Visit (INDEPENDENT_AMBULATORY_CARE_PROVIDER_SITE_OTHER): Payer: Medicare Other | Admitting: Family Medicine

## 2016-04-26 VITALS — BP 122/74 | HR 80 | Temp 97.8°F | Ht 62.5 in | Wt 169.2 lb

## 2016-04-26 DIAGNOSIS — J029 Acute pharyngitis, unspecified: Secondary | ICD-10-CM | POA: Diagnosis not present

## 2016-04-26 LAB — POCT RAPID STREP A (OFFICE): Rapid Strep A Screen: NEGATIVE

## 2016-04-26 MED ORDER — PROMETHAZINE HCL 6.25 MG/5ML PO SYRP: 6.2500 mg | ORAL_SOLUTION | Freq: Three times a day (TID) | ORAL | 0 refills | Status: DC | PRN

## 2016-04-26 NOTE — Progress Notes (Signed)
SUBJECTIVE:   Sheila Schmidt is a 80 y.o. female presents to the clinic for:  Chief Complaint  Patient presents with  . Sore Throat    Pt reports cough x3 days and sore throat     Complains of sore throat for 3 days.  Other associated symptoms: cough, ST, nasal congestion.  Denies: itchy watery eyes, ear pain, ear drainage, shortness of breath and fevers Sick Contacts: husband Therapy to date: Mucinex  History  Smoking Status  . Never Smoker  Smokeless Tobacco  . Never Used    ROS: Pertinent items are noted in HPI  Patient's medications, allergies, past medical, surgical, social and family histories were reviewed and updated as appropriate.  OBJECTIVE:  BP 122/74 (BP Location: Right Arm, Patient Position: Sitting, Cuff Size: Small)   Pulse 80   Temp 97.8 F (36.6 C) (Oral)   Ht 5' 2.5" (1.588 m)   Wt 169 lb 3.2 oz (76.7 kg)   SpO2 93%   BMI 30.45 kg/m  General: Awake, alert, appearing stated age Eyes: conjunctivae and sclerae clear Ears: normal TMs bilaterally Nose: no visible exudate Oropharynx: lips, mucosa, and tongue normal; teeth and gums normal Neck: supple, no significant adenopathy Lungs: clear to auscultation, no wheezes, rales or rhonchi, symmetric air entry, normal effort Heart: rate and rhythm regular Skin:reveals no rash Psych: Age appropriate judgment and insight  ASSESSMENT/PLAN:  Viral pharyngitis - Plan: promethazine (PHENERGAN) 6.25 MG/5ML syrup  Orders as above. Rapid strep neg. Supportive care, see AVS. F/u prn. Pt voiced understanding and agreement to the plan.  [place], DO 04/26/16 2:31 PM

## 2016-04-26 NOTE — Patient Instructions (Signed)
Claritin (loratadine), Allegra (fexofenadine), Zyrtec (cetirizine); these are listed in order from weakest to strongest. Generic, and therefore cheaper, options are in the parentheses.   Flonase (fluticasone); nasal spray that is over the counter. 2 sprays each nostril, once daily. Aim towards the same side eye when you spray.  There are available OTC, and the generic versions, which may be cheaper, are in parentheses. Show this to a pharmacist if you have trouble finding any of these items.  

## 2016-04-26 NOTE — Progress Notes (Signed)
Pre visit review using our clinic review tool, if applicable. No additional management support is needed unless otherwise documented below in the visit note. 

## 2016-06-12 ENCOUNTER — Other Ambulatory Visit: Payer: Self-pay | Admitting: Cardiology

## 2016-07-12 ENCOUNTER — Other Ambulatory Visit: Payer: Self-pay | Admitting: Obstetrics & Gynecology

## 2016-07-12 ENCOUNTER — Telehealth: Payer: Self-pay | Admitting: Physician Assistant

## 2016-07-12 DIAGNOSIS — Z853 Personal history of malignant neoplasm of breast: Secondary | ICD-10-CM

## 2016-07-12 DIAGNOSIS — Z1231 Encounter for screening mammogram for malignant neoplasm of breast: Secondary | ICD-10-CM

## 2016-07-12 NOTE — Telephone Encounter (Signed)
ok 

## 2016-07-12 NOTE — Telephone Encounter (Signed)
Ok with me 

## 2016-07-12 NOTE — Telephone Encounter (Signed)
Patient scheduled for 07/18/2016

## 2016-07-12 NOTE — Telephone Encounter (Signed)
Patient would like to transfer from [place] to Dr. Copland, please advise °

## 2016-07-17 ENCOUNTER — Ambulatory Visit: Payer: Medicare Other

## 2016-07-17 ENCOUNTER — Telehealth: Payer: Self-pay | Admitting: Behavioral Health

## 2016-07-17 ENCOUNTER — Encounter: Payer: Self-pay | Admitting: Behavioral Health

## 2016-07-17 NOTE — Addendum Note (Signed)
Addended by: Kathlen Brunswick on: 07/17/2016 04:32 PM   Modules accepted: Orders

## 2016-07-17 NOTE — Telephone Encounter (Signed)
Unable to reach patient at time of Pre-Visit Call.  Left message for patient to return call when available.    

## 2016-07-17 NOTE — Telephone Encounter (Signed)
Pre-Visit Call completed with patient and chart updated.   Pre-Visit Info documented in Specialty Comments under SnapShot.    

## 2016-07-17 NOTE — Telephone Encounter (Signed)
Pt is returning pre-visit call

## 2016-07-18 ENCOUNTER — Ambulatory Visit (INDEPENDENT_AMBULATORY_CARE_PROVIDER_SITE_OTHER): Payer: Medicare Other | Admitting: Family Medicine

## 2016-07-18 VITALS — BP 110/79 | HR 135 | Temp 97.9°F | Ht 63.0 in | Wt 172.4 lb

## 2016-07-18 DIAGNOSIS — Z5181 Encounter for therapeutic drug level monitoring: Secondary | ICD-10-CM | POA: Diagnosis not present

## 2016-07-18 DIAGNOSIS — R000 Tachycardia, unspecified: Secondary | ICD-10-CM

## 2016-07-18 DIAGNOSIS — Z23 Encounter for immunization: Secondary | ICD-10-CM

## 2016-07-18 DIAGNOSIS — H6121 Impacted cerumen, right ear: Secondary | ICD-10-CM | POA: Diagnosis not present

## 2016-07-18 DIAGNOSIS — E785 Hyperlipidemia, unspecified: Secondary | ICD-10-CM

## 2016-07-18 LAB — COMPREHENSIVE METABOLIC PANEL
ALBUMIN: 4.1 g/dL (ref 3.5–5.2)
ALK PHOS: 64 U/L (ref 39–117)
ALT: 16 U/L (ref 0–35)
AST: 19 U/L (ref 0–37)
BUN: 14 mg/dL (ref 6–23)
CALCIUM: 10 mg/dL (ref 8.4–10.5)
CO2: 28 meq/L (ref 19–32)
CREATININE: 0.67 mg/dL (ref 0.40–1.20)
Chloride: 104 mEq/L (ref 96–112)
GFR: 90.37 mL/min (ref >60.00)
Glucose, Bld: 89 mg/dL (ref 70–99)
Potassium: 4 mEq/L (ref 3.5–5.1)
Sodium: 138 mEq/L (ref 135–145)
Total Bilirubin: 1.3 mg/dL — ABNORMAL HIGH (ref 0.2–1.2)
Total Protein: 7.3 g/dL (ref 6.0–8.3)

## 2016-07-18 LAB — LIPID PANEL
CHOL/HDL RATIO: 3
Cholesterol: 169 mg/dL (ref 0–200)
HDL: 67.2 mg/dL (ref >39.00)
LDL CALC: 91 mg/dL (ref 0–99)
NONHDL: 102.09
Triglycerides: 57 mg/dL (ref 0.0–149.0)
VLDL: 11.4 mg/dL (ref 0.0–40.0)

## 2016-07-18 LAB — CBC
HCT: 43.4 % (ref 36.0–46.0)
HEMOGLOBIN: 14.7 g/dL (ref 12.0–15.0)
MCHC: 33.8 g/dL (ref 30.0–36.0)
MCV: 99.8 fl (ref 78.0–100.0)
PLATELETS: 255 10*3/uL (ref 150.0–400.0)
RBC: 4.35 Mil/uL (ref 3.87–5.11)
RDW: 13.4 % (ref 11.5–15.5)
WBC: 10.6 10*3/uL — ABNORMAL HIGH (ref 4.0–10.5)

## 2016-07-18 MED ORDER — DILTIAZEM HCL ER COATED BEADS 240 MG PO CP24: 240.0000 mg | ORAL_CAPSULE | Freq: Every day | ORAL | 3 refills | Status: DC

## 2016-07-18 NOTE — Progress Notes (Signed)
[place] at [facility] [address] [place], [place] [address] [phone]  Date:  07/18/2016   Name:  Sheila Schmidt   DOB:  [birth year]   MRN:  [id number]  PCP:  Leeanne Rio, PA-C    Chief Complaint: Hearing Problem (c/o hearing loss in right ear. First noticed during Thanksgiving. Pt states that at first she was able clear her ear and now she can't. )   History of Present Illness:  Sheila Schmidt is a 81 y.o. very pleasant female patient who presents with the following:  Former pt of Einar Pheasant- here today to establish care with me and discuss concern about her hearing She notes an issue with her RIGHT ear- a couple of weeks ago a friend did ear candling for her and did get some wax out. However she continues to feel like she cannot quite hear from the right ear and would like to have it cleared out if necessary  History of a fib, chronic anticoagulation on xarelto She is also on cardizem at 180.   History of hyperlipidemia, osteopenia  Her cardiologist is Dr. Marlou Porch - they had tried metoprolol for her but she did not tolerate this She is taking her medications regularly  Noted to be quite tachycardic today- she has not noted any CP, SOB, or palpitations however.  No syncope Called and discussed with Dr. Marlou Porch who recommended that we increase her dilt to 240- I will do so.    Patient Active Problem List   Diagnosis Date Noted  . Paroxysmal atrial fibrillation ([place]) 04/07/2015  . Chronic anticoagulation 04/07/2015  . Atrial flutter by electrocardiogram ([place]) 03/16/2015  . Seborrheic keratosis 02/24/2014  . Epidermal cyst 02/24/2014  . Essential hypertension, benign 02/24/2014  . Sciatica 12/08/2013  . Right otitis media 09/25/2013  . Hyperlipidemia 09/25/2013  . Osteopenia 11/19/2012  . Routine health maintenance 04/06/2012  . Need for prophylactic vaccination and inoculation against influenza 04/02/2012  . Need for  prophylactic vaccination against Streptococcus pneumoniae (pneumococcus) 04/02/2012  . BACK PAIN 01/26/2010  . Carcinoma in situ of breast 09/04/2009  . OTHER AND UNSPECIFIED HYPERLIPIDEMIA 09/04/2009  . DEPRESSION, PROLONGED 09/04/2009  . DEGENERATIVE JOINT DISEASE, KNEES, BILATERAL 09/04/2009  . PERIPHERAL EDEMA 09/04/2009  . COLONIC POLYPS, ADENOMATOUS, HX OF 09/04/2009    Past Medical History:  Diagnosis Date  . Cancer ([place])     Left breast carcinoma in situ  . Cholelithiasis   . Depression   . 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 Hx of adenomatous colonic polyps   . Hyperlipidemia     Past Surgical History:  Procedure Laterality Date  . BREAST LUMPECTOMY  oct '10   Left  . CHOLECYSTECTOMY    . CHOLECYSTECTOMY, LAPAROSCOPIC  '06   [place]  . KNEE ARTHROSCOPY     Left '00 [facility]/ Right '08  . lumpectomy- remote     benign  . TOOTH EXTRACTION    . TOTAL KNEE ARTHROPLASTY  02/05/11   left    Social History  Substance Use Topics  . Smoking status: Never Smoker  . Smokeless tobacco: Never Used  . Alcohol use 6.0 oz/week    10 Glasses of wine per week     Comment: daily    Family History  Problem Relation Age of Onset  . Diabetes Mother     Deceased in 23s  . Heart disease Mother  cad/MI-fatal  . Heart attack Mother   . Cancer Father     liver, Deceased in 95s  . Heart disease Sister   . Breast cancer Neg Hx   . Colon cancer Neg Hx     No Known Allergies  Medication list has been reviewed and updated.  Current Outpatient Prescriptions on File Prior to Visit  Medication Sig Dispense Refill  . acetaminophen (TYLENOL) 500 MG tablet Take 500 mg by mouth daily.    . Cholecalciferol 1000 UNITS tablet Take 1,000 Units by mouth daily.    Marland Kitchen diltiazem (CARDIZEM CD) 180 MG 24 hr capsule Take 1 capsule (180 mg total) by mouth daily. 90 capsule 3  . FLUoxetine (PROZAC) 10 MG capsule Take 10 mg by mouth daily.      Marland Kitchen OVER THE COUNTER  MEDICATION at bedtime as needed. Sleep Aid    . rivaroxaban (XARELTO) 20 MG TABS tablet Take 1 tablet (20 mg total) by mouth daily with supper. 90 tablet 3  . simvastatin (ZOCOR) 20 MG tablet TAKE 1 TABLET(20 MG) BY MOUTH AT BEDTIME 30 tablet 9   No current facility-administered medications on file prior to visit.     Review of Systems:  As per HPI- otherwise negative.   Physical Examination: Vitals:   07/18/16 1227  BP: 110/79  Pulse: (!) 135  Temp: 97.9 F (36.6 C)   Vitals:   07/18/16 1227  Weight: 172 lb 6.4 oz (78.2 kg)  Height: 5\' 3"  (1.6 m)   Body mass index is 30.54 kg/m. Ideal Body Weight: Weight in (lb) to have BMI = 25: 140.8  GEN: WDWN, NAD, Non-toxic, A & O x 3, overweight, looks well HEENT: Atraumatic, Normocephalic. Neck supple. No masses, No LAD.  Bilateral TM wnl once irrigated, oropharynx normal.  PEERL,EOMI.   Ears and Nose: No external deformity. CV: RRR but tachycardic to about 140, No M/G/R. No JVD. No thrill. No extra heart sounds. PULM: CTA B, no wheezes, crackles, rhonchi. No retractions. No resp. distress. No accessory muscle use. ABD: S, NT, ND, +BS. No rebound. No HSM. EXTR: No c/c/e NEURO Normal gait.  PSYCH: Normally interactive. Conversant. Not depressed or anxious appearing.  Calm demeanor.   Irrigated right ear until wax removed;  Pt felt better  EKG: compared with 01/2016- atrial fib with rate approx 135 BPM.  Aberrantl conduction of complexes   Assessment and Plan: Tachycardia - Plan: EKG 12-Lead  Hearing loss of right ear due to cerumen impaction  Dyslipidemia - Plan: Lipid panel  Medication monitoring encounter - Plan: CBC, Comprehensive metabolic panel  Need for influenza vaccination - Plan: Flu vaccine HIGH DOSE PF (Fluzone High dose)  Here today for a couple of concerns Basic labs pending as above, flu shot today Irrigated right ear and removed wax, pt hearing better Noted to have likely atrial fib with rate approx 135  BPM She is on appropriate anticoagulation and is asymptomatic  Discussed with Dr. Marlou Porch- will increase her dilt to 240. Also recommended having her see EP- she is willing to do so,  I will place this referral for her  Signed Lamar Blinks, MD

## 2016-07-18 NOTE — Patient Instructions (Signed)
We removed the wax from your right ear today- if you continue to notice that your hearing seems lacking you may want to have your hearing formally tested an at audiology clinic.  Your heart rate is fast today- I have a message in with Dr. Marlou Porch and will call you after I speak with him.  We may need to make an adjustment to your medication Take care!

## 2016-07-18 NOTE — Progress Notes (Signed)
Pre visit review using our clinic review tool, if applicable. No additional management support is needed unless otherwise documented below in the visit note. 

## 2016-07-20 ENCOUNTER — Encounter: Payer: Self-pay | Admitting: Family Medicine

## 2016-07-27 ENCOUNTER — Encounter: Payer: Self-pay | Admitting: Cardiology

## 2016-08-08 ENCOUNTER — Ambulatory Visit: Payer: Medicare Other | Admitting: Cardiology

## 2016-08-09 ENCOUNTER — Encounter: Payer: Self-pay | Admitting: Family Medicine

## 2016-08-09 ENCOUNTER — Ambulatory Visit
Admission: RE | Admit: 2016-08-09 | Discharge: 2016-08-09 | Disposition: A | Discharge status: Home / Self Care | Payer: Medicare Other | Source: Ambulatory Visit | Attending: Obstetrics & Gynecology | Admitting: Obstetrics & Gynecology

## 2016-08-09 DIAGNOSIS — Z853 Personal history of malignant neoplasm of breast: Secondary | ICD-10-CM

## 2016-08-09 DIAGNOSIS — Z1231 Encounter for screening mammogram for malignant neoplasm of breast: Secondary | ICD-10-CM

## 2016-08-15 ENCOUNTER — Encounter: Payer: Self-pay | Admitting: Cardiology

## 2016-08-15 ENCOUNTER — Ambulatory Visit: Payer: Medicare Other | Admitting: Cardiology

## 2016-08-15 ENCOUNTER — Ambulatory Visit (INDEPENDENT_AMBULATORY_CARE_PROVIDER_SITE_OTHER): Payer: Medicare Other | Admitting: Cardiology

## 2016-08-15 VITALS — BP 130/82 | HR 88 | Ht 62.0 in | Wt 173.8 lb

## 2016-08-15 DIAGNOSIS — I480 Paroxysmal atrial fibrillation: Secondary | ICD-10-CM | POA: Diagnosis not present

## 2016-08-15 NOTE — Progress Notes (Signed)
Electrophysiology Office Note   Date:  08/15/2016   ID:  Sheila Schmidt, DOB Jan 01, 1936, MRN [id number]  PCP:  Leeanne Rio, PA-C  Cardiologist:  Marlou Porch Primary Electrophysiologist:  Will Meredith Leeds, MD    Chief Complaint  Patient presents with  . New Patient (Initial Visit)    PAF     History of Present Illness: Sheila Schmidt is a 81 y.o. female who presents today for electrophysiology evaluation.   History of atrial fibrillation, atrial flutter, HTN, HLD presenting for workup of atrial fibrillation. No chest pain, SOB, PND or orthopnea. She has been working out at [employer] doing body pump and spin classes without issues. She is SOB when she climbs stairs. She is able to exercise without issues. She was started on diltiazem as her HR was elevated. Her HR now is better controlled with a rate of 88.     Today, she denies symptoms of palpitations, chest pain, shortness of breath, orthopnea, PND, lower extremity edema, claudication, dizziness, presyncope, syncope, bleeding, or neurologic sequela. The patient is tolerating medications without difficulties and is otherwise without complaint today.    Past Medical History:  Diagnosis Date  . Cancer ([place])     Left breast carcinoma in situ  . Cholelithiasis   . Depression   . 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 Hx of adenomatous colonic polyps   . Hyperlipidemia    Past Surgical History:  Procedure Laterality Date  . BREAST LUMPECTOMY  oct '10   Left  . CHOLECYSTECTOMY    . CHOLECYSTECTOMY, LAPAROSCOPIC  '06   [place]  . KNEE ARTHROSCOPY     Left '00 [facility]/ Right '08  . lumpectomy- remote     benign  . TOOTH EXTRACTION    . TOTAL KNEE ARTHROPLASTY  02/05/11   left     Current Outpatient Prescriptions  Medication Sig Dispense Refill  . acetaminophen (TYLENOL) 500 MG tablet Take 500 mg by mouth daily.    . Cholecalciferol 1000 UNITS tablet Take 1,000 Units by mouth daily.    Marland Kitchen diltiazem  (CARDIZEM CD) 240 MG 24 hr capsule Take 1 capsule (240 mg total) by mouth daily. 90 capsule 3  . FLUoxetine (PROZAC) 10 MG capsule Take 10 mg by mouth daily.      Marland Kitchen OVER THE COUNTER MEDICATION at bedtime as needed. Sleep Aid    . rivaroxaban (XARELTO) 20 MG TABS tablet Take 1 tablet (20 mg total) by mouth daily with supper. 90 tablet 3  . simvastatin (ZOCOR) 20 MG tablet TAKE 1 TABLET(20 MG) BY MOUTH AT BEDTIME 30 tablet 9   No current facility-administered medications for this visit.     Allergies:   Patient has no known allergies.   Social History:  The patient  reports that she has never smoked. She has never used smokeless tobacco. She reports that she drinks about 6.0 oz of alcohol per week . She reports that she does not use drugs.   Family History:  The patient's family history includes Cancer in her father; Diabetes in her mother; Heart attack in her mother; Heart disease in her mother and sister.    ROS:  Please see the history of present illness.   Otherwise, review of systems is positive for none.   All other systems are reviewed and negative.    PHYSICAL EXAM: VS:  BP 130/82   Pulse 88   Ht 5\' 2"  (1.575 m)   Wt  173 lb 12.8 oz (78.8 kg)   BMI 31.79 kg/m  , BMI Body mass index is 31.79 kg/m. GEN: Well nourished, well developed, in no acute distress  HEENT: normal  Neck: no JVD, carotid bruits, or masses Cardiac: iRRR; no murmurs, rubs, or gallops,no edema  Respiratory:  clear to auscultation bilaterally, normal work of breathing GI: soft, nontender, nondistended, + BS MS: no deformity or atrophy  Skin: warm and dry Neuro:  Strength and sensation are intact Psych: euthymic mood, full affect  EKG:  EKG is not ordered today. Personal review of the ekg ordered shows atrial flutter, rate 97   Recent Labs: 07/18/2016: ALT 16; BUN 14; Creatinine, Ser 0.67; Hemoglobin 14.7; Platelets 255.0; Potassium 4.0; Sodium 138    Lipid Panel     Component Value Date/Time   CHOL  169 07/18/2016 1319   TRIG 57.0 07/18/2016 1319   HDL 67.20 07/18/2016 1319   CHOLHDL 3 07/18/2016 1319   VLDL 11.4 07/18/2016 1319   LDLCALC 91 07/18/2016 1319     Wt Readings from Last 3 Encounters:  08/15/16 173 lb 12.8 oz (78.8 kg)  07/18/16 172 lb 6.4 oz (78.2 kg)  04/26/16 169 lb 3.2 oz (76.7 kg)      Other studies Reviewed: Additional studies/ records that were reviewed today include: TTE 2016, Myoview 2017, Holter 01/18/16  Review of the above records today demonstrates:  - Left ventricle: The cavity size was normal. Systolic function was   normal. The estimated ejection fraction was in the range of 50%   to 55%. Hypokinesis of the anteroseptal and inferoseptal   myocardium. The study is not technically sufficient to allow   evaluation of LV diastolic function. - Aortic valve: There was mild regurgitation. - Mitral valve: There was trivial regurgitation. - Left atrium: The atrium was moderately dilated. - Right ventricle: The cavity size was normal. Wall thickness was   normal. Systolic function was normal. - Pulmonic valve: There was mild regurgitation.   There was no ST segment deviation noted during stress.  This is a low risk study. Mild anteroseptal decreased radiotracer uptake seen at both rest and stress consistent with left bundle branch block artifact. No ischemia identified.  Atrial fibrillation-non-gated study, no ejection fraction calculated.   Atrial fibrillation / flutter noted with variable conduction  Mean heart rate 99 BPM  No pauses greater than 3 seconds  PVC's occasional (240) noted  ASSESSMENT AND PLAN:  1.  Permanent atrial fibrillation: currently rate controlled on diltiazem with Xarelto for anticoagulation. asymptomatic and able to exercise without issues. No medication changes planned. As she has been in AF for quite a while, it may be difficult to get her into sinus rhythm. Will plan a rate control strategy at this time.  2.  Hyperlipidemia: continue simvastatin      Current medicines are reviewed at length with the patient today.   The patient does not have concerns regarding her medicines.  The following changes were made today:  none  Labs/ tests ordered today include:  No orders of the defined types were placed in this encounter.    Disposition:   FU with Will Camnitz 3 months  Signed, Will Meredith Leeds, MD  08/15/2016 2:08 PM     [facility] [address] [place] [address] [phone] (office) [phone] (fax)

## 2016-08-15 NOTE — Patient Instructions (Addendum)
Medication Instructions:    Your physician recommends that you continue on your current medications as directed. Please refer to the Current Medication list given to you today.  --- If you need a refill on your cardiac medications before your next appointment, please call your pharmacy. ---  Labwork:  None ordered  Testing/Procedures:  None ordered  Follow-Up:  Your physician recommends that you schedule a follow-up appointment in: 3 months with Dr. Curt Bears.   We cancelled your appointment with Dr. Kingsley Plan later this month.  Thank you for choosing [facility]!!   Trinidad Curet, RN [phone]

## 2016-08-27 ENCOUNTER — Ambulatory Visit: Payer: Medicare Other | Admitting: Cardiology

## 2016-09-04 ENCOUNTER — Ambulatory Visit: Payer: Medicare Other | Admitting: Cardiology

## 2016-09-05 ENCOUNTER — Ambulatory Visit: Payer: Medicare Other | Admitting: Cardiology

## 2016-09-19 ENCOUNTER — Telehealth: Payer: Self-pay | Admitting: Family Medicine

## 2016-09-19 NOTE — Telephone Encounter (Signed)
Left pt message asking to call Allison back directly at [phone] to schedule AWV. Thanks! °

## 2016-10-15 ENCOUNTER — Encounter: Payer: Self-pay | Admitting: Family Medicine

## 2016-10-16 ENCOUNTER — Encounter: Payer: Self-pay | Admitting: Family Medicine

## 2016-10-16 ENCOUNTER — Ambulatory Visit (INDEPENDENT_AMBULATORY_CARE_PROVIDER_SITE_OTHER): Payer: Medicare Other | Admitting: Family Medicine

## 2016-10-16 VITALS — BP 126/88 | HR 68 | Temp 98.2°F | Resp 16 | Ht 62.0 in | Wt 169.4 lb

## 2016-10-16 DIAGNOSIS — I480 Paroxysmal atrial fibrillation: Secondary | ICD-10-CM

## 2016-10-16 DIAGNOSIS — R000 Tachycardia, unspecified: Secondary | ICD-10-CM | POA: Diagnosis not present

## 2016-10-16 DIAGNOSIS — R42 Dizziness and giddiness: Secondary | ICD-10-CM | POA: Diagnosis not present

## 2016-10-16 DIAGNOSIS — R8290 Unspecified abnormal findings in urine: Secondary | ICD-10-CM | POA: Diagnosis not present

## 2016-10-16 LAB — CBC WITH DIFFERENTIAL/PLATELET
BASOS PCT: 0.9 % (ref 0.0–3.0)
Basophils Absolute: 0.1 10*3/uL (ref 0.0–0.1)
EOS ABS: 0.1 10*3/uL (ref 0.0–0.7)
Eosinophils Relative: 1 % (ref 0.0–5.0)
HCT: 45.2 % (ref 36.0–46.0)
Hemoglobin: 15.1 g/dL — ABNORMAL HIGH (ref 12.0–15.0)
Lymphocytes Relative: 21.1 % (ref 12.0–46.0)
Lymphs Abs: 2 10*3/uL (ref 0.7–4.0)
MCHC: 33.5 g/dL (ref 30.0–36.0)
MCV: 100.6 fl — ABNORMAL HIGH (ref 78.0–100.0)
MONO ABS: 1 10*3/uL (ref 0.1–1.0)
Monocytes Relative: 10.2 % (ref 3.0–12.0)
NEUTROS ABS: 6.3 10*3/uL (ref 1.4–7.7)
Neutrophils Relative %: 66.8 % (ref 43.0–77.0)
PLATELETS: 242 10*3/uL (ref 150.0–400.0)
RBC: 4.49 Mil/uL (ref 3.87–5.11)
RDW: 13.8 % (ref 11.5–15.5)
WBC: 9.4 10*3/uL (ref 4.0–10.5)

## 2016-10-16 LAB — POC URINALSYSI DIPSTICK (AUTOMATED)
Bilirubin, UA: NEGATIVE
GLUCOSE UA: NEGATIVE
Ketones, UA: NEGATIVE
Leukocytes, UA: NEGATIVE
Nitrite, UA: NEGATIVE
Protein, UA: NEGATIVE
SPEC GRAV UA: 1.015 (ref 1.010–1.025)
UROBILINOGEN UA: NEGATIVE U/dL — AB
pH, UA: 6 (ref 5.0–8.0)

## 2016-10-16 LAB — COMPREHENSIVE METABOLIC PANEL
ALT: 12 U/L (ref 0–35)
AST: 15 U/L (ref 0–37)
Albumin: 4.3 g/dL (ref 3.5–5.2)
Alkaline Phosphatase: 61 U/L (ref 39–117)
BUN: 10 mg/dL (ref 6–23)
CALCIUM: 10 mg/dL (ref 8.4–10.5)
CHLORIDE: 105 meq/L (ref 96–112)
CO2: 28 meq/L (ref 19–32)
Creatinine, Ser: 0.73 mg/dL (ref 0.40–1.20)
GFR: 81.8 mL/min (ref >60.00)
Glucose, Bld: 100 mg/dL — ABNORMAL HIGH (ref 70–99)
POTASSIUM: 4.5 meq/L (ref 3.5–5.1)
SODIUM: 139 meq/L (ref 135–145)
Total Bilirubin: 1.6 mg/dL — ABNORMAL HIGH (ref 0.2–1.2)
Total Protein: 7.3 g/dL (ref 6.0–8.3)

## 2016-10-16 LAB — TSH: TSH: 1.86 u[IU]/mL (ref 0.35–4.50)

## 2016-10-16 MED ORDER — DILTIAZEM HCL ER COATED BEADS 300 MG PO CP24: 300.0000 mg | ORAL_CAPSULE | Freq: Every day | ORAL | 2 refills | Status: DC

## 2016-10-16 NOTE — Progress Notes (Signed)
Patient ID: Sheila Schmidt, female   DOB: 1936-01-11, 81 y.o.   MRN: [id number]     Subjective:    Patient ID: Sheila Schmidt, female    DOB: 01-18-1936, 81 y.o.   MRN: [id number]  Chief Complaint  Patient presents with  . Hypertension  . Tachycardia  . light headed    after body pump class.    HPI Patient is in today for elevated blood pressure, increased heart rate, light headedness.  She felt light headed after doing a body bump class yesterday.  She denies any chest pains today.    Past Medical History:  Diagnosis Date  . Cancer ([place])     Left breast carcinoma in situ  . Cholelithiasis   . Depression   . 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 Hx of adenomatous colonic polyps   . Hyperlipidemia     Past Surgical History:  Procedure Laterality Date  . BREAST LUMPECTOMY  oct '10   Left  . CHOLECYSTECTOMY    . CHOLECYSTECTOMY, LAPAROSCOPIC  '06   [place]  . KNEE ARTHROSCOPY     Left '00 [facility]/ Right '08  . lumpectomy- remote     benign  . TOOTH EXTRACTION    . TOTAL KNEE ARTHROPLASTY  02/05/11   left    Family History  Problem Relation Age of Onset  . Diabetes Mother     Deceased in 87s  . Heart disease Mother     cad/MI-fatal  . Heart attack Mother   . Cancer Father     liver, Deceased in 68s  . Heart disease Sister   . Breast cancer Neg Hx   . Colon cancer Neg Hx     Social History   Social History  . Marital status: Married    Spouse name: N/A  . Number of children: 3  . Years of education: N/A   Occupational History  . 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 Smoking status: Never Smoker  . Smokeless tobacco: Never Used  . Alcohol use 6.0 oz/week    10 Glasses of wine per week     Comment: daily  . Drug use: No  . Sexual activity: Not on file   Other Topics Concern  . Not on file   Social History Narrative   [employer]. [employer]. Married '59, 3 daughters- '61, '64, '73;  2 grandchildren.Marriage in good health. Exercise - goes to gym 5/wk. ACP - directed to the [email or web address]    Outpatient Medications Prior to Visit  Medication Sig Dispense Refill  . acetaminophen (TYLENOL) 500 MG tablet Take 500 mg by mouth daily.    . Cholecalciferol 1000 UNITS tablet Take 1,000 Units by mouth daily.    Marland Kitchen FLUoxetine (PROZAC) 10 MG capsule Take 10 mg by mouth daily.      Marland Kitchen OVER THE COUNTER MEDICATION at bedtime as needed. Sleep Aid    . rivaroxaban (XARELTO) 20 MG TABS tablet Take 1 tablet (20 mg total) by mouth daily with supper. 90 tablet 3  . simvastatin (ZOCOR) 20 MG tablet TAKE 1 TABLET(20 MG) BY MOUTH AT BEDTIME 30 tablet 9  . diltiazem (CARDIZEM CD) 240 MG 24 hr capsule Take 1 capsule (240 mg total) by mouth daily. 90 capsule 3   No facility-administered medications prior to visit.     No Known Allergies  Review of Systems  Constitutional: Negative  for fever and malaise/fatigue.  HENT: Negative for congestion.   Eyes: Negative for blurred vision.  Respiratory: Negative for cough and shortness of breath.   Cardiovascular: Negative for chest pain, palpitations and leg swelling.       Tachycardia   Gastrointestinal: Negative for vomiting.  Musculoskeletal: Negative for back pain.  Skin: Negative for rash.  Neurological: Negative for loss of consciousness and headaches.       Objective:    Physical Exam  Constitutional: She is oriented to person, place, and time. She appears well-developed and well-nourished. No distress.  HENT:  Head: Normocephalic and atraumatic.  Eyes: Conjunctivae are normal.  Neck: Normal range of motion. No thyromegaly present.  Cardiovascular: Normal rate and regular rhythm.   Pulmonary/Chest: Effort normal and breath sounds normal. She has no wheezes.  Abdominal: Soft. Bowel sounds are normal. There is no tenderness.  Musculoskeletal: Normal range of motion. She exhibits no edema or deformity.  Neurological: She is  alert and oriented to person, place, and time.  Skin: Skin is warm and dry. She is not diaphoretic.  Psychiatric: She has a normal mood and affect.    BP 126/88 (BP Location: Right Arm, Cuff Size: Large)   Pulse 68   Temp 98.2 F (36.8 C) (Oral)   Resp 16   Ht 5\' 2"  (1.575 m)   Wt 169 lb 6.4 oz (76.8 kg)   SpO2 97%   BMI 30.98 kg/m  Wt Readings from Last 3 Encounters:  10/16/16 169 lb 6.4 oz (76.8 kg)  08/15/16 173 lb 12.8 oz (78.8 kg)  07/18/16 172 lb 6.4 oz (78.2 kg)     Lab Results  Component Value Date   WBC 9.4 10/16/2016   HGB 15.1 (H) 10/16/2016   HCT 45.2 10/16/2016   PLT 242.0 10/16/2016   GLUCOSE 100 (H) 10/16/2016   CHOL 169 07/18/2016   TRIG 57.0 07/18/2016   HDL 67.20 07/18/2016   LDLCALC 91 07/18/2016   ALT 12 10/16/2016   AST 15 10/16/2016   NA 139 10/16/2016   K 4.5 10/16/2016   CL 105 10/16/2016   CREATININE 0.73 10/16/2016   BUN 10 10/16/2016   CO2 28 10/16/2016   TSH 1.86 10/16/2016   INR 1.03 03/01/2011    Lab Results  Component Value Date   TSH 1.86 10/16/2016   Lab Results  Component Value Date   WBC 9.4 10/16/2016   HGB 15.1 (H) 10/16/2016   HCT 45.2 10/16/2016   MCV 100.6 (H) 10/16/2016   PLT 242.0 10/16/2016   Lab Results  Component Value Date   NA 139 10/16/2016   K 4.5 10/16/2016   CHLORIDE 108 11/12/2013   CO2 28 10/16/2016   GLUCOSE 100 (H) 10/16/2016   BUN 10 10/16/2016   CREATININE 0.73 10/16/2016   BILITOT 1.6 (H) 10/16/2016   ALKPHOS 61 10/16/2016   AST 15 10/16/2016   ALT 12 10/16/2016   PROT 7.3 10/16/2016   ALBUMIN 4.3 10/16/2016   CALCIUM 10.0 10/16/2016   ANIONGAP 7 03/16/2015   GFR 81.80 10/16/2016   Lab Results  Component Value Date   CHOL 169 07/18/2016   Lab Results  Component Value Date   HDL 67.20 07/18/2016   Lab Results  Component Value Date   LDLCALC 91 07/18/2016   Lab Results  Component Value Date   TRIG 57.0 07/18/2016   Lab Results  Component Value Date   CHOLHDL 3  07/18/2016   No results found for: HGBA1C  EKG-- Afib 109 Assessment & Plan:   Problem List Items Addressed This Visit      Unprioritized   Paroxysmal atrial fibrillation (HCC) - Primary    A fib 109 on ekg-- pulse went up to 133 Pt said it was even higher last night bp was running high as well Inc cardizem cd to 300 mg  Check labs  f/u Thursday with pcp Call with any concerns Stick with low impact exercise for now      Relevant Medications   diltiazem (CARDIZEM CD) 300 MG 24 hr capsule   Other Relevant Orders   CBC with Differential/Platelet (Completed)   Comprehensive metabolic panel (Completed)   TSH (Completed)   POCT Urinalysis Dipstick (Automated) (Completed)    Other Visit Diagnoses    Tachycardia       Relevant Orders   EKG 12-Lead (Completed)   CBC with Differential/Platelet (Completed)   Comprehensive metabolic panel (Completed)   TSH (Completed)   POCT Urinalysis Dipstick (Automated) (Completed)   Lightheaded       Relevant Orders   CBC with Differential/Platelet (Completed)   Comprehensive metabolic panel (Completed)   TSH (Completed)   POCT Urinalysis Dipstick (Automated) (Completed)   Abnormal urine       Relevant Orders   Urine Culture      I have discontinued Ms. Hochman's diltiazem. I am also having her start on diltiazem. Additionally, I am having her maintain her FLUoxetine, Cholecalciferol, OVER THE COUNTER MEDICATION, acetaminophen, rivaroxaban, and simvastatin.  Meds ordered this encounter  Medications  . diltiazem (CARDIZEM CD) 300 MG 24 hr capsule    Sig: Take 1 capsule (300 mg total) by mouth daily.    Dispense:  30 capsule    Refill:  2    CMA served as scribe during this visit. History, Physical and Plan performed by medical provider. Documentation and orders reviewed and attested to.  Ann Held, DO

## 2016-10-16 NOTE — Progress Notes (Signed)
Pre visit review using our clinic review tool, if applicable. No additional management support is needed unless otherwise documented below in the visit note. 

## 2016-10-16 NOTE — Patient Instructions (Signed)

## 2016-10-16 NOTE — Assessment & Plan Note (Addendum)
A fib 109 on ekg-- pulse went up to 133 Pt said it was even higher last night bp was running high as well Inc cardizem cd to 300 mg  Check labs  f/u Thursday with pcp Call with any concerns Stick with low impact exercise for now

## 2016-10-17 LAB — URINE CULTURE

## 2016-10-18 ENCOUNTER — Ambulatory Visit (INDEPENDENT_AMBULATORY_CARE_PROVIDER_SITE_OTHER): Payer: Medicare Other | Admitting: Family Medicine

## 2016-10-18 VITALS — BP 132/90 | HR 129 | Temp 97.7°F | Ht 62.0 in | Wt 171.4 lb

## 2016-10-18 DIAGNOSIS — I480 Paroxysmal atrial fibrillation: Secondary | ICD-10-CM

## 2016-10-18 DIAGNOSIS — R000 Tachycardia, unspecified: Secondary | ICD-10-CM | POA: Diagnosis not present

## 2016-10-18 NOTE — Progress Notes (Signed)
[facility] [address], [place], [place] [address] [phone]  Date:  10/18/2016   Name:  Sheila Schmidt   DOB:  Jul 10, 1935   MRN:  [id number]  PCP:  Darreld Mclean, MD    Chief Complaint: Follow-up (Pt here for 2 day f/u. Last seen on 10/16/16 by Dr. Etter Sjogren. )   History of Present Illness:  Sheila Schmidt is a 81 y.o. very pleasant female patient who presents with the following:  I last saw this pt in February - HPI from that visit  Former pt of Einar Pheasant- here today to establish care with me and discuss concern about her hearing She notes an issue with her RIGHT ear- a couple of weeks ago a friend did ear candling for her and did get some wax out. However she continues to feel like she cannot quite hear from the right ear and would like to have it cleared out if necessary  History of a fib, chronic anticoagulation on xarelto She is also on cardizem at 180.   History of hyperlipidemia, osteopenia  Her cardiologist is Dr. Marlou Porch - they had tried metoprolol for her but she did not tolerate this She is taking her medications regularly  Noted to be quite tachycardic today- she has not noted any CP, SOB, or palpitations however.  No syncope Called and discussed with Dr. Marlou Porch who recommended that we increase her dilt to 240- I will do so.    Dr. Marlou Porch also suggested an EP consultation-   She saw Dr. Curt Bears with EP in March.  Plan to see him again next month  She was in again 2 days ago and saw Dr. Etter Sjogren- she had noted high BP, tachycardia and felt lightheaded after an exercise class.    Her pulse was up to approx 130, we increased her dilt from 240 to 300 mg ER. She is here today for a recheck Her labs from 5/8 look fine- TSH is normal  She has continued her xarelto,  No CP or SOB, her energy level is fine She has felt just fine- she did 45 min on the spin bike this am without any difficulty. She has not noted  any SE from the increase of diltiazem   BP Readings from Last 3 Encounters:  10/18/16 132/90  10/16/16 126/88  08/15/16 130/82     Patient Active Problem List   Diagnosis Date Noted  . Paroxysmal atrial fibrillation ([place]) 04/07/2015  . Chronic anticoagulation 04/07/2015  . Atrial flutter by electrocardiogram ([place]) 03/16/2015  . Seborrheic keratosis 02/24/2014  . Epidermal cyst 02/24/2014  . Essential hypertension, benign 02/24/2014  . Sciatica 12/08/2013  . Right otitis media 09/25/2013  . Hyperlipidemia 09/25/2013  . Osteopenia 11/19/2012  . Routine health maintenance 04/06/2012  . Need for prophylactic vaccination and inoculation against influenza 04/02/2012  . Need for prophylactic vaccination against Streptococcus pneumoniae (pneumococcus) 04/02/2012  . BACK PAIN 01/26/2010  . Carcinoma in situ of breast 09/04/2009  . OTHER AND UNSPECIFIED HYPERLIPIDEMIA 09/04/2009  . DEPRESSION, PROLONGED 09/04/2009  . DEGENERATIVE JOINT DISEASE, KNEES, BILATERAL 09/04/2009  . PERIPHERAL EDEMA 09/04/2009  . COLONIC POLYPS, ADENOMATOUS, HX OF 09/04/2009    Past Medical History:  Diagnosis Date  . Cancer ([place])     Left breast carcinoma in situ  . Cholelithiasis   . Depression   . 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 Hx of adenomatous  colonic polyps   . Hyperlipidemia     Past Surgical History:  Procedure Laterality Date  . BREAST LUMPECTOMY  oct '10   Left  . CHOLECYSTECTOMY    . CHOLECYSTECTOMY, LAPAROSCOPIC  '06   [place]  . KNEE ARTHROSCOPY     Left '00 [facility]/ Right '08  . lumpectomy- remote     benign  . TOOTH EXTRACTION    . TOTAL KNEE ARTHROPLASTY  02/05/11   left    Social History  Substance Use Topics  . Smoking status: Never Smoker  . Smokeless tobacco: Never Used  . Alcohol use 6.0 oz/week    10 Glasses of wine per week     Comment: daily    Family History  Problem Relation Age of Onset  . Diabetes Mother        Deceased in 49s   . Heart disease Mother        cad/MI-fatal  . Heart attack Mother   . Cancer Father        liver, Deceased in 5s  . Heart disease Sister   . Breast cancer Neg Hx   . Colon cancer Neg Hx     No Known Allergies  Medication list has been reviewed and updated.  Current Outpatient Prescriptions on File Prior to Visit  Medication Sig Dispense Refill  . acetaminophen (TYLENOL) 500 MG tablet Take 500 mg by mouth daily.    . Cholecalciferol 1000 UNITS tablet Take 1,000 Units by mouth daily.    Marland Kitchen diltiazem (CARDIZEM CD) 300 MG 24 hr capsule Take 1 capsule (300 mg total) by mouth daily. 30 capsule 2  . FLUoxetine (PROZAC) 10 MG capsule Take 10 mg by mouth daily.      Marland Kitchen OVER THE COUNTER MEDICATION at bedtime as needed. Sleep Aid    . rivaroxaban (XARELTO) 20 MG TABS tablet Take 1 tablet (20 mg total) by mouth daily with supper. 90 tablet 3  . simvastatin (ZOCOR) 20 MG tablet TAKE 1 TABLET(20 MG) BY MOUTH AT BEDTIME 30 tablet 9   No current facility-administered medications on file prior to visit.     Review of Systems:  .apser   Physical Examination: Vitals:   10/18/16 1334  BP: 132/90  Pulse: (!) 129  Temp: 97.7 F (36.5 C)   Vitals:   10/18/16 1334  Weight: 171 lb 6.4 oz (77.7 kg)  Height: 5\' 2"  (1.575 m)   Body mass index is 31.35 kg/m. Ideal Body Weight: Weight in (lb) to have BMI = 25: 136.4  GEN: WDWN, NAD, Non-toxic, A & O x 3, overweight, looks well and younger than age 37: Atraumatic, Normocephalic. Neck supple. No masses, No LAD. Ears and Nose: No external deformity. CV: irregular pulse c/w atrial fib, rate varies between approx 80- 110 BPM No M/G/R. No JVD. No thrill. No extra heart sounds. PULM: CTA B, no wheezes, crackles, rhonchi. No retractions. No resp. distress. No accessory muscle use. ABD: S, NT, ND, +BS. No rebound. No HSM. EXTR: No c/c/e NEURO Normal gait.  PSYCH: Normally interactive. Conversant. Not depressed or anxious appearing.  Calm  demeanor.    Assessment and Plan: Paroxysmal atrial fibrillation (HCC)  Tachycardia  Here today to follow-up on tachycardia. She came in 2 days ago because her fit bit watch said that her pulse was 180 and she got worried. Dr. Etter Sjogren noted her rate to be about 130, pt asymptomatic. We increased her diltiazem dose slightly. She is here today for a follow-up visit, feeling  well.  Her rate is lower today.  Checked several times and found to be approx 80- 110 BPM.   For the time being continue increased diltiazem dose. Message to Dr. Curt Bears about follow-up She will seek care if she is not feeling well, has any CP, etc  Signed Lamar Blinks, MD

## 2016-10-18 NOTE — Patient Instructions (Signed)
Continue your higher dose of diltiazem for now.  I will contact Dr. Curt Bears about you to see if he wants to see you sooner than next month.  If you have any problems- feel lightheaded, or have any chest pain or other concerns please seek care right away,  You can also call me while you are out of town in [place] if you need anything

## 2016-10-19 ENCOUNTER — Encounter: Payer: Self-pay | Admitting: Family Medicine

## 2016-10-22 ENCOUNTER — Encounter: Payer: Self-pay | Admitting: Family Medicine

## 2016-10-30 ENCOUNTER — Encounter: Payer: Self-pay | Admitting: Family Medicine

## 2016-10-30 ENCOUNTER — Ambulatory Visit (INDEPENDENT_AMBULATORY_CARE_PROVIDER_SITE_OTHER): Payer: Medicare Other | Admitting: Medical

## 2016-10-30 ENCOUNTER — Encounter: Payer: Self-pay | Admitting: Medical

## 2016-10-30 VITALS — BP 110/58 | HR 73 | Temp 98.9°F | Resp 16 | Wt 167.8 lb

## 2016-10-30 DIAGNOSIS — R059 Cough, unspecified: Secondary | ICD-10-CM

## 2016-10-30 DIAGNOSIS — J0100 Acute maxillary sinusitis, unspecified: Secondary | ICD-10-CM

## 2016-10-30 DIAGNOSIS — R05 Cough: Secondary | ICD-10-CM

## 2016-10-30 MED ORDER — FLUTICASONE PROPIONATE 50 MCG/ACT NA SUSP: 2.0000 | Freq: Every day | NASAL | 1 refills | Status: DC

## 2016-10-30 MED ORDER — HYDROCODONE-HOMATROPINE 5-1.5 MG/5ML PO SYRP: 5.0000 mL | ORAL_SOLUTION | Freq: Three times a day (TID) | ORAL | 0 refills | Status: DC | PRN

## 2016-10-30 MED ORDER — DOXYCYCLINE HYCLATE 100 MG PO TABS: 100.0000 mg | ORAL_TABLET | Freq: Two times a day (BID) | ORAL | 0 refills | Status: DC

## 2016-10-30 NOTE — Patient Instructions (Addendum)
You appear to have a sinus infection. I am prescribing doxycycline antibiotic for the infection. To help with the nasal congestion I prescribed flonase nasal steroid. For your associated cough, I prescribed cough medicine hycodan(rx advisement given)  Rest, hydrate, tylenol for fever.  Follow up in 7 days or as needed.

## 2016-10-30 NOTE — Progress Notes (Signed)
Subjective:    Patient ID: Sheila Schmidt, female    DOB: 08-30-1935, 81 y.o.   MRN: [id number]  HPI  Pt in with 4 days of nasal congestion but got progressively worse with sinus pressure. She has faint st and dry cough. Pt states moderate sinus pressure. Gradually worsening. When she bends over pain in frontal and maxillary sinus region.  Pt was out of town when he symptoms. Pt state in [place] when symptoms started. But was cold damp and rainy. Relative on trip were sick as well.  Cough keeping her up at night. But not productive.   Review of Systems  Constitutional: Negative for chills and fatigue.  HENT: Positive for congestion, postnasal drip, sinus pain, sinus pressure and sneezing. Negative for ear pain.   Respiratory: Positive for cough. Negative for chest tightness, shortness of breath and wheezing.   Cardiovascular: Negative for chest pain and palpitations.  Gastrointestinal: Negative for abdominal pain.  Musculoskeletal: Negative for back pain, myalgias and neck pain.  Skin: Negative for rash.  Neurological: Negative for dizziness, weakness, numbness and headaches.  Hematological: Negative for adenopathy. Does not bruise/bleed easily.  Psychiatric/Behavioral: Negative for behavioral problems and confusion.   Past Medical History:  Diagnosis Date  . Cancer ([place])     Left breast carcinoma in situ  . Cholelithiasis   . Depression   . 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 Hx of adenomatous colonic polyps   . Hyperlipidemia      Social History   Social History  . Marital status: Married    Spouse name: N/A  . Number of children: 3  . Years of education: N/A   Occupational History  . 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 Smoking status: Never Smoker  . Smokeless tobacco: Never Used  . Alcohol use 6.0 oz/week    10 Glasses of wine per week     Comment: daily  . Drug use: No  .  Sexual activity: Not on file   Other Topics Concern  . Not on file   Social History Narrative   [employer]. [employer]. Married '59, 3 daughters- '61, '64, '73; 2 grandchildren.Marriage in good health. Exercise - goes to gym 5/wk. ACP - directed to the [email or web address]    Past Surgical History:  Procedure Laterality Date  . BREAST LUMPECTOMY  oct '10   Left  . CHOLECYSTECTOMY    . CHOLECYSTECTOMY, LAPAROSCOPIC  '06   [place]  . KNEE ARTHROSCOPY     Left '00 [facility]/ Right '08  . lumpectomy- remote     benign  . TOOTH EXTRACTION    . TOTAL KNEE ARTHROPLASTY  02/05/11   left    Family History  Problem Relation Age of Onset  . Diabetes Mother        Deceased in 69s  . Heart disease Mother        cad/MI-fatal  . Heart attack Mother   . Cancer Father        liver, Deceased in 37s  . Heart disease Sister   . Breast cancer Neg Hx   . Colon cancer Neg Hx     No Known Allergies  Current Outpatient Prescriptions on File Prior to Visit  Medication Sig Dispense Refill  . acetaminophen (TYLENOL) 500 MG tablet Take 500 mg by mouth daily.    . Cholecalciferol 1000 UNITS tablet Take 1,000 Units by  mouth daily.    Marland Kitchen diltiazem (CARDIZEM CD) 300 MG 24 hr capsule Take 1 capsule (300 mg total) by mouth daily. 30 capsule 2  . FLUoxetine (PROZAC) 10 MG capsule Take 10 mg by mouth daily.      Marland Kitchen OVER THE COUNTER MEDICATION at bedtime as needed. Sleep Aid    . rivaroxaban (XARELTO) 20 MG TABS tablet Take 1 tablet (20 mg total) by mouth daily with supper. 90 tablet 3  . simvastatin (ZOCOR) 20 MG tablet TAKE 1 TABLET(20 MG) BY MOUTH AT BEDTIME 30 tablet 9   No current facility-administered medications on file prior to visit.     BP (!) 110/58 (BP Location: Right Arm, Patient Position: Sitting, Cuff Size: Normal)   Pulse 73   Temp 98.9 F (37.2 C) (Oral)   Resp 16   Wt 167 lb 12.8 oz (76.1 kg)   SpO2 98%   BMI 30.69 kg/m       Objective:   Physical Exam   General  Mental Status -  Alert. General Appearance - Well groomed. Not in acute distress.  Skin Rashes- No Rashes.  HEENT Head- Normal. Ear Auditory Canal - Left- Normal. Right - Normal.Tympanic Membrane- Left- Normal. Right- Normal. Eye Sclera/Conjunctiva- Left- Normal. Right- Normal. Nose & Sinuses Nasal Mucosa- Left-  Boggy and Congested. Right-  Boggy and  Congested.Bilateral maxillary but no frontal sinus pressure. Mouth & Throat Lips: Upper Lip- Normal: no dryness, cracking, pallor, cyanosis, or vesicular eruption. Lower Lip-Normal: no dryness, cracking, pallor, cyanosis or vesicular eruption. Buccal Mucosa- Bilateral- No Aphthous ulcers. Oropharynx- No Discharge or Erythema. Tonsils: Characteristics- Bilateral- No Erythema or Congestion. Size/Enlargement- Bilateral- No enlargement. Discharge- bilateral-None.  Neck Neck- Supple. No Masses.   Chest and Lung Exam Auscultation: Breath Sounds:-Clear even and unlabored.  Cardiovascular Auscultation:Rhythm- Regular, rate and rhythm. Murmurs & Other Heart Sounds:Auscultation of the heart reveal- No Murmurs.  Lymphatic Head & Neck General Head & Neck Lymphatics: Bilateral: Description- No Localized lymphadenopathy.      Assessment & Plan:  You appear to have a sinus infection. I am prescribing doxycycline antibiotic for the infection. To help with the nasal congestion I prescribed flonase nasal steroid. For your associated cough, I prescribed cough medicine hycodan(rx advisement given)  Rest, hydrate, tylenol for fever.  Follow up in 7 days or as needed.

## 2016-11-01 ENCOUNTER — Encounter: Payer: Self-pay | Admitting: Medical

## 2016-11-01 ENCOUNTER — Telehealth: Payer: Self-pay | Admitting: Medical

## 2016-11-01 NOTE — Telephone Encounter (Signed)
Pt seen on Tuesday. She states not getting better/worse. Will you call her and advise her to come in tomorrow. We are approaching 3 day weekend and may need to treat with injection such as rocephin or change meds. Need to examen first. 1:15 might be convenient appointment for her.

## 2016-11-01 NOTE — Telephone Encounter (Signed)
Appointment scheduled for tomorrow at 10:30 per patient preference.

## 2016-11-01 NOTE — Telephone Encounter (Signed)
Called patient and left message to return call

## 2016-11-01 NOTE — Telephone Encounter (Signed)
Patient returned call. Please call back

## 2016-11-02 ENCOUNTER — Ambulatory Visit (HOSPITAL_BASED_OUTPATIENT_CLINIC_OR_DEPARTMENT_OTHER)
Admission: RE | Admit: 2016-11-02 | Discharge: 2016-11-02 | Disposition: A | Discharge status: Home / Self Care | Payer: Medicare Other | Source: Ambulatory Visit | Attending: Medical | Admitting: Medical

## 2016-11-02 ENCOUNTER — Encounter: Payer: Self-pay | Admitting: Medical

## 2016-11-02 ENCOUNTER — Ambulatory Visit (INDEPENDENT_AMBULATORY_CARE_PROVIDER_SITE_OTHER): Payer: Medicare Other | Admitting: Medical

## 2016-11-02 VITALS — BP 110/58 | HR 64 | Temp 98.1°F | Ht 62.0 in | Wt 169.2 lb

## 2016-11-02 DIAGNOSIS — J90 Pleural effusion, not elsewhere classified: Secondary | ICD-10-CM | POA: Insufficient documentation

## 2016-11-02 DIAGNOSIS — I517 Cardiomegaly: Secondary | ICD-10-CM | POA: Diagnosis not present

## 2016-11-02 DIAGNOSIS — J40 Bronchitis, not specified as acute or chronic: Secondary | ICD-10-CM | POA: Diagnosis not present

## 2016-11-02 DIAGNOSIS — I480 Paroxysmal atrial fibrillation: Secondary | ICD-10-CM

## 2016-11-02 DIAGNOSIS — R05 Cough: Secondary | ICD-10-CM | POA: Diagnosis not present

## 2016-11-02 DIAGNOSIS — R059 Cough, unspecified: Secondary | ICD-10-CM

## 2016-11-02 DIAGNOSIS — J0100 Acute maxillary sinusitis, unspecified: Secondary | ICD-10-CM | POA: Diagnosis not present

## 2016-11-02 LAB — CBC WITH DIFFERENTIAL/PLATELET
BASOS ABS: 0.1 10*3/uL (ref 0.0–0.1)
Basophils Relative: 0.7 % (ref 0.0–3.0)
EOS PCT: 0.9 % (ref 0.0–5.0)
Eosinophils Absolute: 0.1 10*3/uL (ref 0.0–0.7)
HEMATOCRIT: 39.9 % (ref 36.0–46.0)
Hemoglobin: 13.5 g/dL (ref 12.0–15.0)
LYMPHS ABS: 1.7 10*3/uL (ref 0.7–4.0)
LYMPHS PCT: 14.9 % (ref 12.0–46.0)
MCHC: 33.8 g/dL (ref 30.0–36.0)
MCV: 99.6 fl (ref 78.0–100.0)
MONOS PCT: 13.6 % — AB (ref 3.0–12.0)
Monocytes Absolute: 1.5 10*3/uL — ABNORMAL HIGH (ref 0.1–1.0)
NEUTROS ABS: 7.8 10*3/uL — AB (ref 1.4–7.7)
NEUTROS PCT: 69.9 % (ref 43.0–77.0)
PLATELETS: 290 10*3/uL (ref 150.0–400.0)
RBC: 4.01 Mil/uL (ref 3.87–5.11)
RDW: 13.3 % (ref 11.5–15.5)
WBC: 11.2 10*3/uL — ABNORMAL HIGH (ref 4.0–10.5)

## 2016-11-02 LAB — BRAIN NATRIURETIC PEPTIDE: Pro B Natriuretic peptide (BNP): 205 pg/mL — ABNORMAL HIGH (ref 0.0–100.0)

## 2016-11-02 MED ORDER — ALBUTEROL SULFATE HFA 108 (90 BASE) MCG/ACT IN AERS: 2.0000 | INHALATION_SPRAY | Freq: Four times a day (QID) | RESPIRATORY_TRACT | 0 refills | Status: DC | PRN

## 2016-11-02 MED ORDER — CEFTRIAXONE SODIUM 1 G IJ SOLR
1.0000 g | Freq: Once | INTRAMUSCULAR | Status: AC
  Administered 2016-11-02: 1 g via INTRAMUSCULAR

## 2016-11-02 MED ORDER — METHYLPREDNISOLONE ACETATE 40 MG/ML IJ SUSP
40.0000 mg | Freq: Once | INTRAMUSCULAR | Status: AC
  Administered 2016-11-02: 40 mg via INTRAMUSCULAR

## 2016-11-02 NOTE — Progress Notes (Signed)
Subjective:    Patient ID: Sheila Schmidt, female    DOB: [birth year], 81 y.o.   MRN: [id number]  HPI  Pt in for follow up.   Pt states that she is worse. The most bothersome symptoms is coughing at night. Will sleep couple of hours but cough will keep her up. On hycodan and still has cough. I did not do cxr on last visit. Cough dry at times other times productive.   Pt is not diabetic. She has good gfr.   Pt sinus pressure has decreased and less nasal congestion.  On review she denies any prior inhaler use. Pt never smoked.  Review of Systems  Constitutional: Positive for fatigue. Negative for chills, diaphoresis and fever.       Not sleeping well.  HENT: Positive for congestion, postnasal drip, sinus pain and sinus pressure. Negative for sneezing.   Respiratory: Positive for cough. Negative for chest tightness, shortness of breath and wheezing.   Cardiovascular: Negative for chest pain and palpitations.  Gastrointestinal: Negative for abdominal pain.  Musculoskeletal: Negative for back pain.  Skin: Negative for rash.  Neurological: Negative for dizziness, light-headedness and headaches.  Hematological: Negative for adenopathy. Does not bruise/bleed easily.  Psychiatric/Behavioral: Negative for behavioral problems, self-injury and suicidal ideas. The patient is not nervous/anxious.    Past Medical History:  Diagnosis Date  . Cancer ([place])     Left breast carcinoma in situ  . Cholelithiasis   . Depression   . 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 Hx of adenomatous colonic polyps   . Hyperlipidemia      Social History   Social History  . Marital status: Married    Spouse name: N/A  . Number of children: 3  . Years of education: N/A   Occupational History  . 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 Smoking status: Never Smoker  . Smokeless tobacco: Never Used  . Alcohol use 6.0 oz/week   10 Glasses of wine per week     Comment: daily  . Drug use: No  . Sexual activity: Not on file   Other Topics Concern  . Not on file   Social History Narrative   [employer]. [employer]. Married '59, 3 daughters- '61, '64, '73; 2 grandchildren.Marriage in good health. Exercise - goes to gym 5/wk. ACP - directed to the [email or web address]    Past Surgical History:  Procedure Laterality Date  . BREAST LUMPECTOMY  oct '10   Left  . CHOLECYSTECTOMY    . CHOLECYSTECTOMY, LAPAROSCOPIC  '06   [place]  . KNEE ARTHROSCOPY     Left '00 [facility]/ Right '08  . lumpectomy- remote     benign  . TOOTH EXTRACTION    . TOTAL KNEE ARTHROPLASTY  02/05/11   left    Family History  Problem Relation Age of Onset  . Diabetes Mother        Deceased in 43s  . Heart disease Mother        cad/MI-fatal  . Heart attack Mother   . Cancer Father        liver, Deceased in 3s  . Heart disease Sister   . Breast cancer Neg Hx   . Colon cancer Neg Hx     No Known Allergies  Current Outpatient Prescriptions on File Prior to Visit  Medication Sig Dispense Refill  . acetaminophen (TYLENOL) 500 MG tablet  Take 500 mg by mouth daily.    . Cholecalciferol 1000 UNITS tablet Take 1,000 Units by mouth daily.    Marland Kitchen diltiazem (CARDIZEM CD) 300 MG 24 hr capsule Take 1 capsule (300 mg total) by mouth daily. 30 capsule 2  . doxycycline (VIBRA-TABS) 100 MG tablet Take 1 tablet (100 mg total) by mouth 2 (two) times daily. Can give caps or generic 20 tablet 0  . FLUoxetine (PROZAC) 10 MG capsule Take 10 mg by mouth daily.      . fluticasone (FLONASE) 50 MCG/ACT nasal spray Place 2 sprays into both nostrils daily. 16 g 1  . HYDROcodone-homatropine (HYCODAN) 5-1.5 MG/5ML syrup Take 5 mLs by mouth every 8 (eight) hours as needed for cough. 120 mL 0  . OVER THE COUNTER MEDICATION at bedtime as needed. Sleep Aid    . rivaroxaban (XARELTO) 20 MG TABS tablet Take 1 tablet (20 mg total) by mouth daily with supper. 90 tablet 3  .  simvastatin (ZOCOR) 20 MG tablet TAKE 1 TABLET(20 MG) BY MOUTH AT BEDTIME 30 tablet 9   No current facility-administered medications on file prior to visit.     BP (!) 110/58 (BP Location: Right Arm, Patient Position: Sitting, Cuff Size: Normal)   Pulse 64   Temp 98.1 F (36.7 C) (Oral)   Ht 5\' 2"  (1.575 m)   Wt 169 lb 3.2 oz (76.7 kg)   SpO2 97%   BMI 30.95 kg/m       Objective:   Physical Exam  General  Mental Status - Alert. General Appearance - Well groomed. Not in acute distress.  Skin Rashes- No Rashes.  HEENT Head- Normal. Ear Auditory Canal - Left- Normal. Right - Normal.Tympanic Membrane- Left- Normal. Right- Normal. Eye Sclera/Conjunctiva- Left- Normal. Right- Normal. Nose & Sinuses Nasal Mucosa- Left-  Boggy and Congested. Right-  Boggy and  Congested.Bilateral  Less maxillary pressure and no  frontal sinus pressure. Mouth & Throat Lips: Upper Lip- Normal: no dryness, cracking, pallor, cyanosis, or vesicular eruption. Lower Lip-Normal: no dryness, cracking, pallor, cyanosis or vesicular eruption. Buccal Mucosa- Bilateral- No Aphthous ulcers. Oropharynx- No Discharge or Erythema. Tonsils: Characteristics- Bilateral- No Erythema or Congestion. Size/Enlargement- Bilateral- No enlargement. Discharge- bilateral-None.  Neck Neck- Supple. No Masses.   Chest and Lung Exam Auscultation: Breath Sounds:-Clear even and unlabored.  Cardiovascular Auscultation:Rythm- Regular, rate and rhythm. Murmurs & Other Heart Sounds:Ausculatation of the heart reveal- No Murmurs.  Lymphatic Head & Neck General Head & Neck Lymphatics: Bilateral: Description- No Localized lymphadenopathy.  Lower ext- no pedal edema.       Assessment & Plan:  You have some improved sinus infection symptoms but cough still persists. Concerned for bronchitis vs pneumonia.(also concerned for reactive airways).   We gave you rocephin 1 gram today and depomedrol 40 mg im today.  Will get  labs cbc and bnp today.  Also get xray to see if any pneumonia present.  Continue doxycycline antibiotic and hycodan cough med.  We will follow test results see how you do and may add short taper dose prednsione.  Albuterol rx to use if any shortness or breath or wheezing.  Follow up 4 days or as needed

## 2016-11-02 NOTE — Patient Instructions (Signed)
You have some improved sinus infection symptoms but cough still persists. Concerned for bronchitis vs pneumonia.(also concerned for reactive airways).   We gave you rocephin 1 gram today and depomedrol 40 mg im today.  Will get labs cbc and bnp today.  Also get xray to see if any pneumonia present.  Continue doxycycline antibiotic and hycodan cough med.  We will follow test results see how you do and may add short taper dose prednsione.  Albuterol rx to use if any shortness or breath or wheezing.  Follow up 4 days or as needed

## 2016-11-07 ENCOUNTER — Encounter: Payer: Self-pay | Admitting: Medical

## 2016-11-07 ENCOUNTER — Ambulatory Visit (INDEPENDENT_AMBULATORY_CARE_PROVIDER_SITE_OTHER): Payer: Medicare Other | Admitting: Medical

## 2016-11-07 VITALS — BP 114/74 | HR 65 | Temp 98.2°F | Resp 16 | Ht 62.0 in | Wt 164.6 lb

## 2016-11-07 DIAGNOSIS — J0100 Acute maxillary sinusitis, unspecified: Secondary | ICD-10-CM | POA: Diagnosis not present

## 2016-11-07 DIAGNOSIS — Z8679 Personal history of other diseases of the circulatory system: Secondary | ICD-10-CM | POA: Diagnosis not present

## 2016-11-07 DIAGNOSIS — R05 Cough: Secondary | ICD-10-CM | POA: Diagnosis not present

## 2016-11-07 DIAGNOSIS — R450 Nervousness: Secondary | ICD-10-CM | POA: Diagnosis not present

## 2016-11-07 DIAGNOSIS — R059 Cough, unspecified: Secondary | ICD-10-CM

## 2016-11-07 LAB — COMPREHENSIVE METABOLIC PANEL
ALBUMIN: 3.9 g/dL (ref 3.5–5.2)
ALT: 38 U/L — ABNORMAL HIGH (ref 0–35)
AST: 17 U/L (ref 0–37)
Alkaline Phosphatase: 97 U/L (ref 39–117)
BUN: 10 mg/dL (ref 6–23)
CHLORIDE: 100 meq/L (ref 96–112)
CO2: 27 meq/L (ref 19–32)
Calcium: 9.8 mg/dL (ref 8.4–10.5)
Creatinine, Ser: 0.71 mg/dL (ref 0.40–1.20)
GFR: 84.45 mL/min (ref >60.00)
Glucose, Bld: 83 mg/dL (ref 70–99)
POTASSIUM: 3.8 meq/L (ref 3.5–5.1)
SODIUM: 135 meq/L (ref 135–145)
Total Bilirubin: 0.8 mg/dL (ref 0.2–1.2)
Total Protein: 7.7 g/dL (ref 6.0–8.3)

## 2016-11-07 NOTE — Telephone Encounter (Signed)
Scheduled for 5/30 after visit with [name]. If pt agrees.

## 2016-11-07 NOTE — Progress Notes (Signed)
Subjective:    Patient ID: Sheila Schmidt, female    DOB: [birth year], 81 y.o.   MRN: [id number]  HPI  Pt in states she feels a lot better. She reports cough is controlled. She is able to sleep. Pt states sinus pressure has eased up. Still has some faint fatigue. No obvious wheezing.   On last visit gave rocephin 1 gram im and depomedrol 40 mg im  Pt is finishing the doxy  antibiotic tomorrow.   Pt is almost through with hycodan as well.  Pt states about 90% better.  History of a fib,chronic anticoagulation on xarelto She is also diltiazem 300 mg er.   No chest pain, no sob.She has appointment cardiologist on November 15, 2016.Pulse 65 today when I checked.     Review of Systems  Constitutional: Positive for fatigue. Negative for chills and fever.       Mild brief jittery sensation today when at check in desk. Then resolved quickly.  HENT: Negative for congestion and drooling.   Respiratory: Positive for cough. Negative for chest tightness, shortness of breath and wheezing.        Faint residual cough.  Cardiovascular: Negative for chest pain and palpitations.  Gastrointestinal: Negative for abdominal pain, constipation and vomiting.  Genitourinary: Negative for dysuria and enuresis.  Musculoskeletal: Negative for back pain.  Skin: Negative for rash.  Neurological: Negative for dizziness and headaches.  Hematological: Negative for adenopathy. Does not bruise/bleed easily.  Psychiatric/Behavioral: Negative for behavioral problems, confusion and hallucinations. The patient is not nervous/anxious.     Past Medical History:  Diagnosis Date  . Cancer ([place])     Left breast carcinoma in situ  . Cholelithiasis   . Depression   . 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 Hx of adenomatous colonic polyps   . Hyperlipidemia      Social History   Social History  . Marital status: Married    Spouse name: N/A  . Number of children: 3  . Years of education: N/A     Occupational History  . 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 Smoking status: Never Smoker  . Smokeless tobacco: Never Used  . Alcohol use 6.0 oz/week    10 Glasses of wine per week     Comment: daily  . Drug use: No  . Sexual activity: Not on file   Other Topics Concern  . Not on file   Social History Narrative   [employer]. HS[employer]. Married '59, 3 daughters- '61, '64, '73; 2 grandchildren.Marriage in good health. Exercise - goes to gym 5/wk. ACP - directed to the [email or web address]    Past Surgical History:  Procedure Laterality Date  . BREAST LUMPECTOMY  oct '10   Left  . CHOLECYSTECTOMY    . CHOLECYSTECTOMY, LAPAROSCOPIC  '06   [place]  . KNEE ARTHROSCOPY     Left '00 [facility]/ Right '08  . lumpectomy- remote     benign  . TOOTH EXTRACTION    . TOTAL KNEE ARTHROPLASTY  02/05/11   left    Family History  Problem Relation Age of Onset  . Diabetes Mother        Deceased in 73s  . Heart disease Mother        cad/MI-fatal  . Heart attack Mother   . Cancer Father        liver, Deceased in 69s  .  Heart disease Sister   . Breast cancer Neg Hx   . Colon cancer Neg Hx     No Known Allergies  Current Outpatient Prescriptions on File Prior to Visit  Medication Sig Dispense Refill  . acetaminophen (TYLENOL) 500 MG tablet Take 500 mg by mouth daily.    Marland Kitchen albuterol (PROVENTIL HFA;VENTOLIN HFA) 108 (90 Base) MCG/ACT inhaler Inhale 2 puffs into the lungs every 6 (six) hours as needed for wheezing or shortness of breath. 1 Inhaler 0  . Cholecalciferol 1000 UNITS tablet Take 1,000 Units by mouth daily.    Marland Kitchen diltiazem (CARDIZEM CD) 300 MG 24 hr capsule Take 1 capsule (300 mg total) by mouth daily. 30 capsule 2  . doxycycline (VIBRA-TABS) 100 MG tablet Take 1 tablet (100 mg total) by mouth 2 (two) times daily. Can give caps or generic 20 tablet 0  . FLUoxetine (PROZAC) 10 MG capsule Take 10 mg by mouth daily.      .  fluticasone (FLONASE) 50 MCG/ACT nasal spray Place 2 sprays into both nostrils daily. 16 g 1  . HYDROcodone-homatropine (HYCODAN) 5-1.5 MG/5ML syrup Take 5 mLs by mouth every 8 (eight) hours as needed for cough. 120 mL 0  . OVER THE COUNTER MEDICATION at bedtime as needed. Sleep Aid    . rivaroxaban (XARELTO) 20 MG TABS tablet Take 1 tablet (20 mg total) by mouth daily with supper. 90 tablet 3  . simvastatin (ZOCOR) 20 MG tablet TAKE 1 TABLET(20 MG) BY MOUTH AT BEDTIME 30 tablet 9   No current facility-administered medications on file prior to visit.     BP 114/74 (BP Location: Right Arm, Patient Position: Sitting, Cuff Size: Normal)   Pulse 65 Comment: when i check with pulse ox and auscultatd extended duration.  Temp 98.2 F (36.8 C) (Oral)   Resp 16   Ht 5\' 2"  (1.575 m)   Wt 164 lb 9.6 oz (74.7 kg)   SpO2 96%   BMI 30.11 kg/m       Objective:   Physical Exam  General Mental Status- Alert. General Appearance- Not in acute distress.     HEENT Head- Normal. Ear Auditory Canal - Left- Normal. Right - Normal.Tympanic Membrane- Left- Normal. Right- Normal. Eye Sclera/Conjunctiva- Left- Normal. Right- Normal. Nose & Sinuses Nasal Mucosa- Left-  Boggy and Congested. Right-  Boggy and  Congested.Bilateral no  maxillary and no  frontal sinus pressure. Mouth & Throat Lips: Upper Lip- Normal: no dryness, cracking, pallor, cyanosis, or vesicular eruption. Lower Lip-Normal: no dryness, cracking, pallor, cyanosis or vesicular eruption. Buccal Mucosa- Bilateral- No Aphthous ulcers. Oropharynx- No Discharge or Erythema. Tonsils: Characteristics- Bilateral- No Erythema or Congestion. Size/Enlargement- Bilateral- No enlargement. Discharge- bilateral-None.   Skin General: Color- Normal Color. Moisture- Normal Moisture.  Neck Carotid Arteries- Normal color. Moisture- Normal Moisture. No carotid bruits. No JVD.  Chest and Lung Exam Auscultation: Breath  Sounds:-Normal.  Cardiovascular Auscultation:Rythm- Regular. Murmurs & Other Heart Sounds:Auscultation of the heart reveals- No Murmurs.  Abdomen Inspection:-Inspeection Normal. Palpation/Percussion:Note:No mass. Palpation and Percussion of the abdomen reveal- Non Tender, Non Distended + BS, no rebound or guarding.  Neurologic Cranial Nerve exam:- CN III-XII intact(No nystagmus), symmetric smile. Drift Test:- No drift. Romberg Exam:- Negative.  .Finger to Nose:- Normal/Intact Strength:- 5/5 equal and symmetric strength both upper and lower extremities.  Lower ext- no pedal edema.negative homans signs.      Assessment & Plan:  You are about 90% better from cough, sinus infection and bronchitis symptoms.  For atrial fibrillation  hx(paroxysmal) see cardiologist next week. Hold off on any strenuous exercise presently until you see cardiologist. Pulse when I checked was 65 but higher when MA originally checked. Allso might consider getting o2 sat monitor to check pulse and sat.  With mild bnp elevation and cxr finding mid edema, I want you to check weight daily. If weight is increasing and legs swelling would rx short course diuretics.  For jittery feeling earlier please get cmp.  Follow up in 2-3weeks or as needed   Marche Hottenstein, [place], [employer]

## 2016-11-07 NOTE — Patient Instructions (Addendum)
You are about 90% better from cough, sinus infection and bronchitis symptoms.  For atrial fibrillation hx(paroxysmal) see cardiologist next week. Hold off on any strenuous exercise presently until you see cardiologist. Pulse when I checked was 65 but higher when MA originally checked. Allso might consider getting o2 sat monitor to check pulse and sat.  With mild bnp elevation and cxr finding mid edema, I want you to check weight daily. If weight is increasing and legs swelling would rx short course diuretics.  For jittery feeling earlier please get cmp.  Follow up in 2-3weeks or as needed

## 2016-11-09 ENCOUNTER — Telehealth: Payer: Self-pay

## 2016-11-09 MED ORDER — ALBUTEROL SULFATE HFA 108 (90 BASE) MCG/ACT IN AERS: 2.0000 | INHALATION_SPRAY | Freq: Four times a day (QID) | RESPIRATORY_TRACT | 3 refills | Status: DC | PRN

## 2016-11-09 NOTE — Telephone Encounter (Signed)
proair hfa sent to her pharmacy.

## 2016-11-09 NOTE — Telephone Encounter (Signed)
Medication change request for Proventil plan insurance does not cover. Preferred ProAir [facility] or [facility] needs new rx for one for preferred medications.

## 2016-11-14 ENCOUNTER — Encounter: Payer: Self-pay | Admitting: Cardiology

## 2016-11-14 ENCOUNTER — Ambulatory Visit (INDEPENDENT_AMBULATORY_CARE_PROVIDER_SITE_OTHER): Payer: Medicare Other | Admitting: Cardiology

## 2016-11-14 VITALS — BP 122/78 | HR 66 | Ht 62.0 in | Wt 166.4 lb

## 2016-11-14 DIAGNOSIS — E785 Hyperlipidemia, unspecified: Secondary | ICD-10-CM | POA: Diagnosis not present

## 2016-11-14 DIAGNOSIS — I480 Paroxysmal atrial fibrillation: Secondary | ICD-10-CM | POA: Diagnosis not present

## 2016-11-14 NOTE — Progress Notes (Signed)
Electrophysiology Office Note   Date:  11/14/2016   ID:  Sheila Schmidt, DOB [birth year], MRN [id number]  PCP:  Darreld Mclean, MD  Cardiologist:  Marlou Porch Primary Electrophysiologist:  Laketia Vicknair Meredith Leeds, MD    Chief Complaint  Patient presents with  . Follow-up    PAF     History of Present Illness: Sheila Schmidt is a 81 y.o. female who presents today for electrophysiology evaluation.   History of atrial fibrillation, atrial flutter, HTN, HLD presenting for workup of atrial fibrillation. She has refused rhythm control in the past. She currently has no symptoms of atrial fibrillation. She has no fatigue or shortness of breath. She is recently getting over a respiratory illness. She feels like she is almost at 100%. She went on a trip to [place], and the whole family got sick. Her symptoms were mainly due to sore throat and congestion.   Today, denies symptoms of palpitations, chest pain, shortness of breath, orthopnea, PND, lower extremity edema, claudication, dizziness, presyncope, syncope, bleeding, or neurologic sequela. The patient is tolerating medications without difficulties and is otherwise without complaint today.     Past Medical History:  Diagnosis Date  . Cancer ([place])     Left breast carcinoma in situ  . Cholelithiasis   . Depression   . 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 Hx of adenomatous colonic polyps   . Hyperlipidemia    Past Surgical History:  Procedure Laterality Date  . BREAST LUMPECTOMY  oct '10   Left  . CHOLECYSTECTOMY    . CHOLECYSTECTOMY, LAPAROSCOPIC  '06   [place]  . KNEE ARTHROSCOPY     Left '00 [facility]/ Right '08  . lumpectomy- remote     benign  . TOOTH EXTRACTION    . TOTAL KNEE ARTHROPLASTY  02/05/11   left     Current Outpatient Prescriptions  Medication Sig Dispense Refill  . acetaminophen (TYLENOL) 500 MG tablet Take 500 mg by mouth daily.    Marland Kitchen albuterol (PROAIR HFA) 108 (90 Base) MCG/ACT inhaler  Inhale 2 puffs into the lungs every 6 (six) hours as needed for wheezing or shortness of breath. 18 g 3  . Cholecalciferol 1000 UNITS tablet Take 1,000 Units by mouth daily.    Marland Kitchen diltiazem (CARDIZEM CD) 300 MG 24 hr capsule Take 1 capsule (300 mg total) by mouth daily. 30 capsule 2  . FLUoxetine (PROZAC) 10 MG capsule Take 10 mg by mouth daily.      Marland Kitchen OVER THE COUNTER MEDICATION at bedtime as needed. Sleep Aid    . rivaroxaban (XARELTO) 20 MG TABS tablet Take 1 tablet (20 mg total) by mouth daily with supper. 90 tablet 3  . simvastatin (ZOCOR) 20 MG tablet TAKE 1 TABLET(20 MG) BY MOUTH AT BEDTIME 30 tablet 9   No current facility-administered medications for this visit.     Allergies:   Patient has no known allergies.   Social History:  The patient  reports that she has never smoked. She has never used smokeless tobacco. She reports that she drinks about 6.0 oz of alcohol per week . She reports that she does not use drugs.   Family History:  The patient's family history includes Cancer in her father; Diabetes in her mother; Heart attack in her mother; Heart disease in her mother and sister.    ROS:  Please see the history of present illness.   Otherwise, review of systems is positive for none.  All other systems are reviewed and negative.     PHYSICAL EXAM: VS:  BP 122/78   Pulse 66   Ht 5\' 2"  (1.575 m)   Wt 166 lb 6.4 oz (75.5 kg)   BMI 30.43 kg/m  , BMI Body mass index is 30.43 kg/m. GEN: Well nourished, well developed, in no acute distress  HEENT: normal  Neck: no JVD, carotid bruits, or masses Cardiac: iRRR; no murmurs, rubs, or gallops,no edema  Respiratory:  clear to auscultation bilaterally, normal work of breathing GI: soft, nontender, nondistended, + BS MS: no deformity or atrophy  Skin: warm and dry Neuro:  Strength and sensation are intact Psych: euthymic mood, full affect  EKG:  EKG is not ordered today. Personal review of the ekg ordered 10/16/16 shows atrial  fibrillation, rate 109   Recent Labs: 10/16/2016: TSH 1.86 11/02/2016: Hemoglobin 13.5; Platelets 290.0; Pro B Natriuretic peptide (BNP) 205.0 11/07/2016: ALT 38; BUN 10; Creatinine, Ser 0.71; Potassium 3.8; Sodium 135    Lipid Panel     Component Value Date/Time   CHOL 169 07/18/2016 1319   TRIG 57.0 07/18/2016 1319   HDL 67.20 07/18/2016 1319   CHOLHDL 3 07/18/2016 1319   VLDL 11.4 07/18/2016 1319   LDLCALC 91 07/18/2016 1319     Wt Readings from Last 3 Encounters:  11/14/16 166 lb 6.4 oz (75.5 kg)  11/07/16 164 lb 9.6 oz (74.7 kg)  11/02/16 169 lb 3.2 oz (76.7 kg)      Other studies Reviewed: Additional studies/ records that were reviewed today include: TTE 2016, Myoview 2017, Holter 01/18/16  Review of the above records today demonstrates:  - Left ventricle: The cavity size was normal. Systolic function was   normal. The estimated ejection fraction was in the range of 50%   to 55%. Hypokinesis of the anteroseptal and inferoseptal   myocardium. The study is not technically sufficient to allow   evaluation of LV diastolic function. - Aortic valve: There was mild regurgitation. - Mitral valve: There was trivial regurgitation. - Left atrium: The atrium was moderately dilated. - Right ventricle: The cavity size was normal. Wall thickness was   normal. Systolic function was normal. - Pulmonic valve: There was mild regurgitation.   There was no ST segment deviation noted during stress.  This is a low risk study. Mild anteroseptal decreased radiotracer uptake seen at both rest and stress consistent with left bundle branch block artifact. No ischemia identified.  Atrial fibrillation-non-gated study, no ejection fraction calculated.   Atrial fibrillation / flutter noted with variable conduction  Mean heart rate 99 BPM  No pauses greater than 3 seconds  PVC's occasional (240) noted  ASSESSMENT AND PLAN:  1.  Permanent atrial fibrillation: Currently on both diltiazem  and Xarelto. She has no symptoms from her atrial fibrillation at this time. Her rate is well controlled at 66. I have told her that she should feel free to exercise with her symptoms being her limiting factors.  This patients CHA2DS2-VASc Score and unadjusted Ischemic Stroke Rate (% per year) is equal to 3.2 % stroke rate/year from a score of 3  Above score calculated as 1 point each if present [CHF, HTN, DM, Vascular=MI/PAD/Aortic Plaque, Age if 65-74, or Female] Above score calculated as 2 points each if present [Age > 75, or Stroke/TIA/TE]    2. Hyperlipidemia: continue zocor   Current medicines are reviewed at length with the patient today.   The patient does not have concerns regarding her medicines.  The  following changes were made today:  none  Labs/ tests ordered today include:  No orders of the defined types were placed in this encounter.    Disposition:   FU with Morey Andonian 6 months  Signed, Denzil Bristol Meredith Leeds, MD  11/14/2016 2:42 PM     [address] [place] [id number] [phone] (office) [phone] (fax)

## 2016-11-14 NOTE — Patient Instructions (Signed)
Medication Instructions:  Your physician recommends that you continue on your current medications as directed. Please refer to the Current Medication list given to you today.  If you need a refill on your cardiac medications before your next appointment, please call your pharmacy.   Labwork: None ordered  Testing/Procedures: None ordered  Follow-Up: Your physician wants you to follow-up in: 6 months  with Dr. Camnitz.  You will receive a reminder letter in the mail two months in advance. If you don't receive a letter, please call our office to schedule the follow-up appointment.  Thank you for choosing [facility]!!   Ramona Ruark, RN [phone]  Any Other Special Instructions Will Be Listed Below (If Applicable).        

## 2016-11-21 ENCOUNTER — Ambulatory Visit (INDEPENDENT_AMBULATORY_CARE_PROVIDER_SITE_OTHER): Payer: Medicare Other | Admitting: Family Medicine

## 2016-11-21 VITALS — BP 132/88 | HR 72 | Temp 98.2°F | Ht 62.0 in | Wt 165.8 lb

## 2016-11-21 DIAGNOSIS — R000 Tachycardia, unspecified: Secondary | ICD-10-CM | POA: Diagnosis not present

## 2016-11-21 DIAGNOSIS — Z7189 Other specified counseling: Secondary | ICD-10-CM | POA: Diagnosis not present

## 2016-11-21 DIAGNOSIS — Z8679 Personal history of other diseases of the circulatory system: Secondary | ICD-10-CM

## 2016-11-21 NOTE — Patient Instructions (Addendum)
Plain mucinex- guaifenesin is ok for you to take when needed We will do your prevnar pneumonia booster at your next visit   I am glad that you sinuses are better finally!   Please see me in 6 months for a recheck visit and labs

## 2016-11-21 NOTE — Progress Notes (Signed)
[place] at [facility] [address] [place], [place] [address] [phone]  Date:  11/21/2016   Name:  Sheila Schmidt   DOB:  [birth year]   MRN:  [id number]  PCP:  Darreld Mclean, MD    Chief Complaint: Follow-up (Pt here for 2-3 week f/u visit. )   History of Present Illness:  Sheila Schmidt is a 81 y.o. very pleasant female patient who presents with the following:  She was in 3 times in May with a sinus infection- treated by Percell Miller. She is now 100% better per her report  She did see Dr. Curt Bears last week and he was pleased with how she is doing- he wants to see her in 6 months.  She is released for the gym and golf; but she is still taking things a little easy She has permanent a fib on diltiazem and xarelto, her rate has been ok  She has not had the prevnar 13 immunization as of yet- she did have her pneumovax over the age of 43 However she would like to defer this shot until she returns from an upcoming visit to [place] to visit her youngest child   Overall she is feeling quite well She wondered if she can safely use mucinex when needed for her sinuses   BP Readings from Last 3 Encounters:  11/21/16 132/88  11/14/16 122/78  11/07/16 114/74      Patient Active Problem List   Diagnosis Date Noted  . Paroxysmal atrial fibrillation ([place]) 04/07/2015  . Chronic anticoagulation 04/07/2015  . Atrial flutter by electrocardiogram ([place]) 03/16/2015  . Seborrheic keratosis 02/24/2014  . Epidermal cyst 02/24/2014  . Essential hypertension, benign 02/24/2014  . Sciatica 12/08/2013  . Right otitis media 09/25/2013  . Hyperlipidemia 09/25/2013  . Osteopenia 11/19/2012  . Routine health maintenance 04/06/2012  . Need for prophylactic vaccination and inoculation against influenza 04/02/2012  . Need for prophylactic vaccination against Streptococcus pneumoniae (pneumococcus) 04/02/2012  . BACK PAIN 01/26/2010  .  Carcinoma in situ of breast 09/04/2009  . OTHER AND UNSPECIFIED HYPERLIPIDEMIA 09/04/2009  . DEPRESSION, PROLONGED 09/04/2009  . DEGENERATIVE JOINT DISEASE, KNEES, BILATERAL 09/04/2009  . PERIPHERAL EDEMA 09/04/2009  . COLONIC POLYPS, ADENOMATOUS, HX OF 09/04/2009    Past Medical History:  Diagnosis Date  . Cancer ([place])     Left breast carcinoma in situ  . Cholelithiasis   . Depression   . 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 Hx of adenomatous colonic polyps   . Hyperlipidemia     Past Surgical History:  Procedure Laterality Date  . BREAST LUMPECTOMY  oct '10   Left  . CHOLECYSTECTOMY    . CHOLECYSTECTOMY, LAPAROSCOPIC  '06   [place]  . KNEE ARTHROSCOPY     Left '00 [place]/ Right '08  . lumpectomy- remote     benign  . TOOTH EXTRACTION    . TOTAL KNEE ARTHROPLASTY  02/05/11   left    Social History  Substance Use Topics  . Smoking status: Never Smoker  . Smokeless tobacco: Never Used  . Alcohol use 6.0 oz/week    10 Glasses of wine per week     Comment: daily    Family History  Problem Relation Age of Onset  . Diabetes Mother        Deceased in 72s  . Heart disease Mother        cad/MI-fatal  .  Heart attack Mother   . Cancer Father        liver, Deceased in 61s  . Heart disease Sister   . Breast cancer Neg Hx   . Colon cancer Neg Hx     No Known Allergies  Medication list has been reviewed and updated.  Current Outpatient Prescriptions on File Prior to Visit  Medication Sig Dispense Refill  . acetaminophen (TYLENOL) 500 MG tablet Take 500 mg by mouth daily.    Marland Kitchen albuterol (PROAIR HFA) 108 (90 Base) MCG/ACT inhaler Inhale 2 puffs into the lungs every 6 (six) hours as needed for wheezing or shortness of breath. 18 g 3  . Cholecalciferol 1000 UNITS tablet Take 1,000 Units by mouth daily.    Marland Kitchen diltiazem (CARDIZEM CD) 300 MG 24 hr capsule Take 1 capsule (300 mg total) by mouth daily. 30 capsule 2  . FLUoxetine (PROZAC) 10 MG capsule  Take 10 mg by mouth daily.      Marland Kitchen OVER THE COUNTER MEDICATION at bedtime as needed. Sleep Aid    . rivaroxaban (XARELTO) 20 MG TABS tablet Take 1 tablet (20 mg total) by mouth daily with supper. 90 tablet 3  . simvastatin (ZOCOR) 20 MG tablet TAKE 1 TABLET(20 MG) BY MOUTH AT BEDTIME 30 tablet 9   No current facility-administered medications on file prior to visit.     Review of Systems:  As per HPI- otherwise negative. No fever or chills, her sinuses feel good, no nausea, vomiting or diarrhea No abnormal bleeding    Physical Examination: Vitals:   11/21/16 1231  BP: 132/88  Pulse: 72  Temp: 98.2 F (36.8 C)   Vitals:   11/21/16 1231  Weight: 165 lb 12.8 oz (75.2 kg)  Height: 5\' 2"  (1.575 m)   Body mass index is 30.33 kg/m. Ideal Body Weight: Weight in (lb) to have BMI = 25: 136.4  GEN: WDWN, NAD, Non-toxic, A & O x 3 HEENT: Atraumatic, Normocephalic. Neck supple. No masses, No LAD. Ears and Nose: No external deformity. CV: a fib, tachycardic to about 100 BPM, No M/G/R. No JVD. No thrill. No extra heart sounds. PULM: CTA B, no wheezes, crackles, rhonchi. No retractions. No resp. distress. No accessory muscle use. EXTR: No c/c/e NEURO Normal gait.  PSYCH: Normally interactive. Conversant. Not depressed or anxious appearing.  Calm demeanor.    Assessment and Plan: Encounter for medication counseling  History of atrial fibrillation  Tachycardia  Advised that plain mucinex is ok for her to use She will see me in 6 months for a recheck visit For the time being she will continue her xarelto, simvastatin, diltiazem ER at 300 mg, prozac She will contact me if any concerns   Signed Lamar Blinks, MD

## 2016-11-28 ENCOUNTER — Other Ambulatory Visit: Payer: Self-pay | Admitting: Medical

## 2017-01-03 ENCOUNTER — Telehealth (INDEPENDENT_AMBULATORY_CARE_PROVIDER_SITE_OTHER): Payer: Self-pay | Admitting: Orthopaedic Surgery

## 2017-01-03 NOTE — Telephone Encounter (Signed)
Patient requesting another rx for antibotic for denial work Patient uses [facility] [phone].

## 2017-01-03 NOTE — Telephone Encounter (Signed)
Pt had surgery 03/08/2011 Left TKA. Does she still need antibiotic?

## 2017-01-04 ENCOUNTER — Other Ambulatory Visit (INDEPENDENT_AMBULATORY_CARE_PROVIDER_SITE_OTHER): Payer: Self-pay

## 2017-01-04 MED ORDER — CEPHALEXIN 500 MG PO CAPS: 500.0000 mg | ORAL_CAPSULE | Freq: Four times a day (QID) | ORAL | 0 refills | Status: DC

## 2017-01-04 NOTE — Telephone Encounter (Signed)
Sent rx to pharmacy

## 2017-01-04 NOTE — Telephone Encounter (Signed)
According to literature it is not necessary after that many years out from surgery.  However, it is a very cheap antibiotic and if you like, we can prescribe.  That certainly will drop the chance of infection from dental bacteria to ALMOST none

## 2017-01-04 NOTE — Addendum Note (Signed)
Addended by: Mervyn Skeeters on: 01/04/2017 04:08 PM   Modules accepted: Orders

## 2017-01-13 ENCOUNTER — Other Ambulatory Visit: Payer: Self-pay | Admitting: Family Medicine

## 2017-01-13 DIAGNOSIS — I480 Paroxysmal atrial fibrillation: Secondary | ICD-10-CM

## 2017-02-14 ENCOUNTER — Other Ambulatory Visit: Payer: Self-pay | Admitting: Family Medicine

## 2017-02-14 DIAGNOSIS — I480 Paroxysmal atrial fibrillation: Secondary | ICD-10-CM

## 2017-03-02 ENCOUNTER — Other Ambulatory Visit: Payer: Self-pay | Admitting: Cardiology

## 2017-03-04 NOTE — Telephone Encounter (Signed)
Xarelto 20mg  received; pt is 81 yrs old, wt-75.2kg, Crea-0.71 on 11/07/16, CrCl-76.75ml/min, last seen by Dr. Curt Bears on 11/14/16. Will send in refill to requested pharamacy.

## 2017-03-05 ENCOUNTER — Encounter: Payer: Self-pay | Admitting: Family Medicine

## 2017-03-07 MED ORDER — ATORVASTATIN CALCIUM 20 MG PO TABS
20.0000 mg | ORAL_TABLET | Freq: Every day | ORAL | 3 refills | Status: DC
Start: 2017-03-07 — End: 2017-09-25

## 2017-03-14 ENCOUNTER — Other Ambulatory Visit: Payer: Self-pay | Admitting: Family Medicine

## 2017-03-14 DIAGNOSIS — I480 Paroxysmal atrial fibrillation: Secondary | ICD-10-CM

## 2017-04-24 ENCOUNTER — Telehealth: Payer: Self-pay

## 2017-04-24 NOTE — Telephone Encounter (Signed)
   [facility] Pre-operative Risk Assessment    Request for surgical clearance:  1. What type of surgery is being performed? Cataract extraction left eye followed by right eye   2. When is this surgery scheduled? 05/13/17   3. Are there any medications that need to be held prior to surgery and how long?No   4. Practice name and name of physician performing surgery? [facility]   5. What is your office phone and fax number? Please fax signed clearance to [phone]   6. Anesthesia type (None, local, MAC, general) ? topical   Tod Persia 04/24/2017, 11:20 AM  _________________________________________________________________   (provider comments below)

## 2017-04-24 NOTE — Telephone Encounter (Signed)
    Chart reviewed as part of pre-operative protocol coverage.  Cardiologist:  Marlou Porch (02/2016) Primary Electrophysiologist:  Will Meredith Leeds, MD  (last seen 11/14/16)   Spoke with significant other. Patient will call back between 1:30-5:00pm.   Sahvannah Rieser, PA  04/24/2017, 3:52 PM

## 2017-04-25 NOTE — Telephone Encounter (Signed)
Follow Up: ° ° ° °Returning your call from yesterday. °

## 2017-04-25 NOTE — Telephone Encounter (Signed)
   Chart reviewed as part of pre-operative protocol coverage. Given past medical history and time since last visit, based on ACC/AHA guidelines, Sheila Schmidt would be at acceptable risk for the planned procedure without further cardiovascular testing. She will continue anticoagulation with Xarelto throughout the procedure as there is no indication to stop with cataract procedures.   I will route this recommendation to the requesting party via Epic fax function and remove from pre-op pool.  Please call with questions.  Christell Faith, PA-C 04/25/2017, 4:36 PM

## 2017-05-01 NOTE — Telephone Encounter (Signed)
F/u message  Sharyn Lull from Dr. Talbert Forest call to f/u on surgical clearance. Please call back to discuss

## 2017-05-01 NOTE — Telephone Encounter (Signed)
Returned [facility] from [facility] re: cardiac clearance. She stated that she hasn't received the clearance on said pt. I have re faxed it and she will call me back if she doesn't receive it.  She thanked me for calling her back.

## 2017-06-09 NOTE — Progress Notes (Signed)
[place] at [facility] [address], [place], [place] [address] [phone]  Date:  06/12/2017   Name:  Sheila Schmidt   DOB:  03-15-1936   MRN:  [id number]  PCP:  Darreld Mclean, MD    Chief Complaint: Cerumen Impaction (c/o poss ear wax in right ear. Flu vaccine given today.)   History of Present Illness:  Sheila Schmidt is a 81 y.o. very pleasant female patient who presents with the following: History of paroxysmal a fib, anticoagulation with Xarelto, HTN, hyperlipidemia  Would like a flu shot today Also concern of possible recurrent ear wax- she has noted sx of her right ear being occluded for about 2 weeks No pain- just feels clogged up  She is otherwise doing ok, but she does not seem to be tolerating atorvastatin that well We started this med in September- she notes dark spots on her face and some dry skin on her arms which she thinks may be related She is not having body or muscle aches  She drinks 1-2 glasses of wine a day  She has an old rx for a "water pill" that she will use when her feet swell She has HCTZ/ triamterne that is a few years old that she is using as needed  Her left cataract was removed a month ago, she is haivng her right eye done this week She has not been quite as active recently due to weather and her eyes and wonders if this is why she is swelling  She has had intermittent edema of her BLE for a few years No SOB No history of CHF Flu shot today  Wt Readings from Last 3 Encounters:  06/12/17 167 lb 3.2 oz (75.8 kg)  11/21/16 165 lb 12.8 oz (75.2 kg)  11/14/16 166 lb 6.4 oz (75.5 kg)     Patient Active Problem List   Diagnosis Date Noted  . Paroxysmal atrial fibrillation ([place]) 04/07/2015  . Chronic anticoagulation 04/07/2015  . Atrial flutter by electrocardiogram ([place]) 03/16/2015  . Seborrheic keratosis 02/24/2014  . Essential hypertension, benign 02/24/2014  . Sciatica 12/08/2013   . Hyperlipidemia 09/25/2013  . Osteopenia 11/19/2012  . BACK PAIN 01/26/2010  . Carcinoma in situ of breast 09/04/2009  . OTHER AND UNSPECIFIED HYPERLIPIDEMIA 09/04/2009  . DEPRESSION, PROLONGED 09/04/2009  . DEGENERATIVE JOINT DISEASE, KNEES, BILATERAL 09/04/2009  . PERIPHERAL EDEMA 09/04/2009  . COLONIC POLYPS, ADENOMATOUS, HX OF 09/04/2009    Past Medical History:  Diagnosis Date  . Cancer ([place])     Left breast carcinoma in situ  . Cholelithiasis   . Depression   . 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 Hx of adenomatous colonic polyps   . Hyperlipidemia     Past Surgical History:  Procedure Laterality Date  . BREAST LUMPECTOMY  oct '10   Left  . CHOLECYSTECTOMY    . CHOLECYSTECTOMY, LAPAROSCOPIC  '06   [place]  . KNEE ARTHROSCOPY     Left '00 [place]/ Right '08  . lumpectomy- remote     benign  . TOOTH EXTRACTION    . TOTAL KNEE ARTHROPLASTY  02/05/11   left    Social History   Tobacco Use  . Smoking status: Never Smoker  . Smokeless tobacco: Never Used  Substance Use Topics  . Alcohol use: Yes    Alcohol/week: 6.0 oz    Types: 10 Glasses of wine per week  Comment: daily  . Drug use: No    Family History  Problem Relation Age of Onset  . Diabetes Mother        Deceased in 1s  . Heart disease Mother        cad/MI-fatal  . Heart attack Mother   . Cancer Father        liver, Deceased in 59s  . Heart disease Sister   . Breast cancer Neg Hx   . Colon cancer Neg Hx     No Known Allergies  Medication list has been reviewed and updated.  Current Outpatient Medications on File Prior to Visit  Medication Sig Dispense Refill  . acetaminophen (TYLENOL) 500 MG tablet Take 500 mg by mouth daily.    Marland Kitchen albuterol (PROAIR HFA) 108 (90 Base) MCG/ACT inhaler Inhale 2 puffs into the lungs every 6 (six) hours as needed for wheezing or shortness of breath. 18 g 3  . atorvastatin (LIPITOR) 20 MG tablet Take 1 tablet (20 mg total) by mouth  daily. 90 tablet 3  . Cholecalciferol 1000 UNITS tablet Take 1,000 Units by mouth daily.    Marland Kitchen diltiazem (CARTIA XT) 300 MG 24 hr capsule Take 1 capsule (300 mg total) by mouth daily. 30 capsule 1  . FLUoxetine (PROZAC) 10 MG capsule Take 10 mg by mouth daily.      Marland Kitchen OVER THE COUNTER MEDICATION at bedtime as needed. Sleep Aid    . XARELTO 20 MG TABS tablet TAKE 1 TABLET(20 MG) BY MOUTH DAILY WITH SUPPER 90 tablet 2   No current facility-administered medications on file prior to visit.     Review of Systems:  As per HPI- otherwise negative.   Physical Examination: Vitals:   06/12/17 1026  BP: 130/86  Pulse: 71  Temp: 97.7 F (36.5 C)  SpO2: 99%   Vitals:   06/12/17 1026  Weight: 167 lb 3.2 oz (75.8 kg)  Height: 5\' 2"  (1.575 m)   Body mass index is 30.58 kg/m. Ideal Body Weight: Weight in (lb) to have BMI = 25: 136.4  GEN: WDWN, NAD, Non-toxic, A & O x 3. Obese, looks well HEENT: Atraumatic, Normocephalic. Neck supple. No masses, No LAD.  Bilateral TM wnl, oropharynx normal.  PEERL,EOMI.   Ears and Nose: No external deformity. CV: RRR, No M/G/R. No JVD. No thrill. No extra heart sounds. PULM: CTA B, no wheezes, crackles, rhonchi. No retractions. No resp. distress. No accessory muscle use. ABD: S, NT, ND, +BS. No rebound. No HSM. EXTR: No c/c. She has trace edema of both feet and a sock line at the mid shin bilaterlly  NEURO Normal gait.  PSYCH: Normally interactive. Conversant. Not depressed or anxious appearing.  Calm demeanor.  Right ear is occluded with cerumen - this was irrigated and resolved, ear then normal  Pt appears to have "age spots," mostly seb keratoses, on her face.  I do not see any sign of hives or other allergic type skin reactin  Assessment and Plan: Impacted cerumen of right ear  Immunization due - Plan: Flu vaccine HIGH DOSE PF (Fluzone High dose)  Lower extremity edema - Plan: triamterene-hydrochlorothiazide (MAXZIDE-25) 37.5-25 MG  tablet  Medication monitoring encounter - Plan: Comprehensive metabolic panel  Skin change  Impacted cerumen resolved. She still felt like her ear was wet, will let me know if this does not resolve Flu shot today Refilled her maxzide for prn use, check lytes today Suggested compression socks as well for this mild edema Do not  really think that her skin concerns are due to atorvastatin, but she can certainly try stopping this for a few weeks and see if there is any change.  She will update me    Received labs- message to pt  Your electrolytes and kidney function look great.   You liver is fine, except your bilirubin is high. The rest of your liver labs are normal, so I tend to think this is benign.  I am going to ask the lab to add on a follow-up liver test to give [place] more information, and will let you know what I find out.  Please let me know how your swelling does with the fluid pill, and let's plan to visit in about 3 months to check on how you are doing  Take care! Results for orders placed or performed in visit on 06/12/17  Comprehensive metabolic panel  Result Value Ref Range   Sodium 138 135 - 145 mEq/L   Potassium 4.6 3.5 - 5.1 mEq/L   Chloride 101 96 - 112 mEq/L   CO2 29 19 - 32 mEq/L   Glucose, Bld 91 70 - 99 mg/dL   BUN 13 6 - 23 mg/dL   Creatinine, Ser 0.71 0.40 - 1.20 mg/dL   Total Bilirubin 1.9 (H) 0.2 - 1.2 mg/dL   Alkaline Phosphatase 79 39 - 117 U/L   AST 15 0 - 37 U/L   ALT 14 0 - 35 U/L   Total Protein 7.6 6.0 - 8.3 g/dL   Albumin 4.3 3.5 - 5.2 g/dL   Calcium 10.1 8.4 - 10.5 mg/dL   GFR 84.32 >60.00 mL/min     Signed Lamar Blinks, MD

## 2017-06-10 ENCOUNTER — Other Ambulatory Visit: Payer: Self-pay | Admitting: Family Medicine

## 2017-06-10 DIAGNOSIS — I480 Paroxysmal atrial fibrillation: Secondary | ICD-10-CM

## 2017-06-12 ENCOUNTER — Encounter: Payer: Self-pay | Admitting: Family Medicine

## 2017-06-12 ENCOUNTER — Ambulatory Visit: Payer: Medicare Other | Admitting: Family Medicine

## 2017-06-12 VITALS — BP 130/86 | HR 71 | Temp 97.7°F | Ht 62.0 in | Wt 167.2 lb

## 2017-06-12 DIAGNOSIS — H6121 Impacted cerumen, right ear: Secondary | ICD-10-CM

## 2017-06-12 DIAGNOSIS — Z5181 Encounter for therapeutic drug level monitoring: Secondary | ICD-10-CM

## 2017-06-12 DIAGNOSIS — R600 Localized edema: Secondary | ICD-10-CM

## 2017-06-12 DIAGNOSIS — R239 Unspecified skin changes: Secondary | ICD-10-CM

## 2017-06-12 DIAGNOSIS — Z23 Encounter for immunization: Secondary | ICD-10-CM

## 2017-06-12 LAB — COMPREHENSIVE METABOLIC PANEL
ALT: 14 U/L (ref 0–35)
AST: 15 U/L (ref 0–37)
Albumin: 4.3 g/dL (ref 3.5–5.2)
Alkaline Phosphatase: 79 U/L (ref 39–117)
BILIRUBIN TOTAL: 1.9 mg/dL — AB (ref 0.2–1.2)
BUN: 13 mg/dL (ref 6–23)
CO2: 29 meq/L (ref 19–32)
Calcium: 10.1 mg/dL (ref 8.4–10.5)
Chloride: 101 mEq/L (ref 96–112)
Creatinine, Ser: 0.71 mg/dL (ref 0.40–1.20)
GFR: 84.32 mL/min (ref >60.00)
GLUCOSE: 91 mg/dL (ref 70–99)
POTASSIUM: 4.6 meq/L (ref 3.5–5.1)
SODIUM: 138 meq/L (ref 135–145)
Total Protein: 7.6 g/dL (ref 6.0–8.3)

## 2017-06-12 MED ORDER — TRIAMTERENE-HCTZ 37.5-25 MG PO TABS: ORAL_TABLET | ORAL | 1 refills | Status: DC

## 2017-06-12 NOTE — Patient Instructions (Addendum)
Try coming off the atorvastatin for 2-3 weeks and see if your concerns go away.  If so, the atorvastatin is likely to blame and we can try something else.  Please let me know either way!   I refilled your fluid pill for you to use as needed.  We will check on your electrolytes today to make sure you are tolerating this medication   JC

## 2017-07-14 ENCOUNTER — Encounter: Payer: Self-pay | Admitting: Family Medicine

## 2017-08-12 ENCOUNTER — Other Ambulatory Visit: Payer: Self-pay | Admitting: Obstetrics & Gynecology

## 2017-08-12 DIAGNOSIS — Z1231 Encounter for screening mammogram for malignant neoplasm of breast: Secondary | ICD-10-CM

## 2017-08-16 ENCOUNTER — Other Ambulatory Visit: Payer: Self-pay | Admitting: Family Medicine

## 2017-08-16 DIAGNOSIS — I480 Paroxysmal atrial fibrillation: Secondary | ICD-10-CM

## 2017-08-21 ENCOUNTER — Ambulatory Visit
Admission: RE | Admit: 2017-08-21 | Discharge: 2017-08-21 | Disposition: A | Discharge status: Home / Self Care | Payer: Medicare Other | Source: Ambulatory Visit | Attending: Obstetrics & Gynecology | Admitting: Obstetrics & Gynecology

## 2017-08-21 DIAGNOSIS — Z1231 Encounter for screening mammogram for malignant neoplasm of breast: Secondary | ICD-10-CM

## 2017-09-18 ENCOUNTER — Other Ambulatory Visit: Payer: Self-pay | Admitting: Family Medicine

## 2017-09-18 DIAGNOSIS — R000 Tachycardia, unspecified: Secondary | ICD-10-CM

## 2017-09-20 ENCOUNTER — Other Ambulatory Visit: Payer: Self-pay | Admitting: Family Medicine

## 2017-09-20 DIAGNOSIS — I480 Paroxysmal atrial fibrillation: Secondary | ICD-10-CM

## 2017-09-20 MED ORDER — DILTIAZEM HCL ER COATED BEADS 300 MG PO CP24: 300.0000 mg | ORAL_CAPSULE | Freq: Every day | ORAL | 0 refills | Status: DC

## 2017-09-25 ENCOUNTER — Ambulatory Visit (INDEPENDENT_AMBULATORY_CARE_PROVIDER_SITE_OTHER): Payer: Medicare Other | Admitting: Cardiology

## 2017-09-25 ENCOUNTER — Encounter: Payer: Self-pay | Admitting: Cardiology

## 2017-09-25 VITALS — BP 124/82 | HR 78 | Ht 62.0 in | Wt 169.0 lb

## 2017-09-25 DIAGNOSIS — I4821 Permanent atrial fibrillation: Secondary | ICD-10-CM

## 2017-09-25 DIAGNOSIS — I482 Chronic atrial fibrillation: Secondary | ICD-10-CM

## 2017-09-25 DIAGNOSIS — E785 Hyperlipidemia, unspecified: Secondary | ICD-10-CM | POA: Diagnosis not present

## 2017-09-25 NOTE — Progress Notes (Signed)
Electrophysiology Office Note   Date:  09/25/2017   ID:  ILEEN KAHRE, DOB 04-07-1936, MRN [id number]  PCP:  Darreld Mclean, MD  Cardiologist:  Marlou Porch Primary Electrophysiologist:  Nakaiya Beddow Meredith Leeds, MD    Chief Complaint  Patient presents with  . Follow-up    Permanent Afib     History of Present Illness: Sheila Schmidt is a 82 y.o. female who presents today for electrophysiology evaluation.   History of atrial fibrillation, atrial flutter, HTN, HLD presenting for workup of atrial fibrillation. She has refused rhythm control in the past.   Today, denies symptoms of palpitations, chest pain, shortness of breath, orthopnea, PND, lower extremity edema, claudication, dizziness, presyncope, syncope, bleeding, or neurologic sequela. The patient is tolerating medications without difficulties.  She is overall feeling well.  She has no chest pain or shortness of breath.  She has noted no further episodes of palpitations.  Past Medical History:  Diagnosis Date  . Cancer ([place])     Left breast carcinoma in situ  . Cholelithiasis   . Depression   . 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 Hx of adenomatous colonic polyps   . Hyperlipidemia    Past Surgical History:  Procedure Laterality Date  . BREAST EXCISIONAL BIOPSY Left 2010   benign  . CHOLECYSTECTOMY    . CHOLECYSTECTOMY, LAPAROSCOPIC  '06   [place]  . KNEE ARTHROSCOPY     Left '00 [facility]/ Right '08  . lumpectomy- remote     benign  . TOOTH EXTRACTION    . TOTAL KNEE ARTHROPLASTY  02/05/11   left     Current Outpatient Medications  Medication Sig Dispense Refill  . acetaminophen (TYLENOL) 500 MG tablet Take 500 mg by mouth daily.    . Cholecalciferol 1000 UNITS tablet Take 1,000 Units by mouth daily.    Marland Kitchen diltiazem (CARDIZEM CD) 300 MG 24 hr capsule Take 1 capsule (300 mg total) by mouth daily. 30 capsule 0  . FLUoxetine (PROZAC) 10 MG capsule Take 10 mg by mouth every other day.     Marland Kitchen OVER  THE COUNTER MEDICATION at bedtime as needed. Sleep Aid    . triamterene-hydrochlorothiazide (MAXZIDE-25) 37.5-25 MG tablet Take 1/2 or 1 daily as needed for foot swelling 90 tablet 1  . XARELTO 20 MG TABS tablet TAKE 1 TABLET(20 MG) BY MOUTH DAILY WITH SUPPER 90 tablet 2   No current facility-administered medications for this visit.     Allergies:   Patient has no known allergies.   Social History:  The patient  reports that she has never smoked. She has never used smokeless tobacco. She reports that she drinks about 6.0 oz of alcohol per week. She reports that she does not use drugs.   Family History:  The patient's family history includes Cancer in her father; Diabetes in her mother; Heart attack in her mother; Heart disease in her mother and sister.   ROS:  Please see the history of present illness.   Otherwise, review of systems is positive for none.   All other systems are reviewed and negative.   PHYSICAL EXAM: VS:  BP 124/82   Pulse 78   Ht 5\' 2"  (1.575 m)   Wt 169 lb (76.7 kg)   SpO2 96%   BMI 30.91 kg/m  , BMI Body mass index is 30.91 kg/m. GEN: Well nourished, well developed, in no acute distress  HEENT: normal  Neck: no JVD, carotid bruits, or  masses Cardiac: iRRR; no murmurs, rubs, or gallops,no edema  Respiratory:  clear to auscultation bilaterally, normal work of breathing GI: soft, nontender, nondistended, + BS MS: no deformity or atrophy  Skin: warm and dry Neuro:  Strength and sensation are intact Psych: euthymic mood, full affect  EKG:  EKG is ordered today. Personal review of the ekg ordered  shows atrial fibrillation, interventricular conduction block, septal Q waves, rate 78  Recent Labs: 10/16/2016: TSH 1.86 11/02/2016: Hemoglobin 13.5; Platelets 290.0; Pro B Natriuretic peptide (BNP) 205.0 06/12/2017: ALT 14; BUN 13; Creatinine, Ser 0.71; Potassium 4.6; Sodium 138    Lipid Panel     Component Value Date/Time   CHOL 169 07/18/2016 1319   TRIG 57.0  07/18/2016 1319   HDL 67.20 07/18/2016 1319   CHOLHDL 3 07/18/2016 1319   VLDL 11.4 07/18/2016 1319   LDLCALC 91 07/18/2016 1319     Wt Readings from Last 3 Encounters:  09/25/17 169 lb (76.7 kg)  06/12/17 167 lb 3.2 oz (75.8 kg)  11/21/16 165 lb 12.8 oz (75.2 kg)      Other studies Reviewed: Additional studies/ records that were reviewed today include: TTE 2016, Myoview 2017, Holter 01/18/16  Review of the above records today demonstrates:  - Left ventricle: The cavity size was normal. Systolic function was   normal. The estimated ejection fraction was in the range of 50%   to 55%. Hypokinesis of the anteroseptal and inferoseptal   myocardium. The study is not technically sufficient to allow   evaluation of LV diastolic function. - Aortic valve: There was mild regurgitation. - Mitral valve: There was trivial regurgitation. - Left atrium: The atrium was moderately dilated. - Right ventricle: The cavity size was normal. Wall thickness was   normal. Systolic function was normal. - Pulmonic valve: There was mild regurgitation.   There was no ST segment deviation noted during stress.  This is a low risk study. Mild anteroseptal decreased radiotracer uptake seen at both rest and stress consistent with left bundle branch block artifact. No ischemia identified.  Atrial fibrillation-non-gated study, no ejection fraction calculated.   Atrial fibrillation / flutter noted with variable conduction  Mean heart rate 99 BPM  No pauses greater than 3 seconds  PVC's occasional (240) noted  ASSESSMENT AND PLAN:  1.  Permanent atrial fibrillation: On diltiazem and anticoagulation with Xarelto.  She has no symptoms from her atrial fibrillation.  Her heart rate is well controlled.  She is exercising without issue.  No changes at this time.    This patients CHA2DS2-VASc Score and unadjusted Ischemic Stroke Rate (% per year) is equal to 3.2 % stroke rate/year from a score of 3  Above  score calculated as 1 point each if present [CHF, HTN, DM, Vascular=MI/PAD/Aortic Plaque, Age if 65-74, or Female] Above score calculated as 2 points each if present [Age > 75, or Stroke/TIA/TE]    2. Hyperlipidemia: He was taken off of her Lipitor by her primary physician.  She Ladarren Steiner discuss with her further therapies.   Current medicines are reviewed at length with the patient today.   The patient does not have concerns regarding her medicines.  The following changes were made today: None  Labs/ tests ordered today include:  Orders Placed This Encounter  Procedures  . EKG 12-Lead     Disposition:   FU with Chaselynn Kepple 6 months  Signed, Dorleen Kissel Meredith Leeds, MD  09/25/2017 2:43 PM     [facility]  [address] [address] [phone] (office) [phone] (fax)

## 2017-09-25 NOTE — Patient Instructions (Signed)
Medication Instructions:  Your physician recommends that you continue on your current medications as directed. Please refer to the Current Medication list given to you today.  Labwork: None ordered  Testing/Procedures: None ordered  Follow-Up: Your physician wants you to follow-up in: 6 months with Dr. Camnitz.  You will receive a reminder letter in the mail two months in advance. If you don't receive a letter, please call our office to schedule the follow-up appointment.  * If you need a refill on your cardiac medications before your next appointment, please call your pharmacy.   *Please note that any paperwork needing to be filled out by the provider will need to be addressed at the front desk prior to seeing the provider. Please note that any FMLA, disability or other documents regarding health condition is subject to a $25.00 charge that must be received prior to completion of paperwork in the form of a money order or check.  Thank you for choosing [facility]!!   Koralynn Greenspan, RN [phone]        

## 2017-10-24 ENCOUNTER — Other Ambulatory Visit: Payer: Self-pay | Admitting: Family Medicine

## 2017-10-24 DIAGNOSIS — I480 Paroxysmal atrial fibrillation: Secondary | ICD-10-CM

## 2017-11-02 ENCOUNTER — Other Ambulatory Visit: Payer: Self-pay | Admitting: Cardiology

## 2017-11-05 NOTE — Telephone Encounter (Signed)
Xarelto 20mg  refill request received; pt is 82 yrs old, wt-76.7kg, Crea-0.71 on 06/12/17, last seen by Dr. Curt Bears on 09/25/17 and CrCl-91.54ml/min; will send in refill to requested pharmacy.

## 2017-11-15 ENCOUNTER — Ambulatory Visit: Payer: Medicare Other | Admitting: Medical

## 2017-11-15 ENCOUNTER — Encounter: Payer: Self-pay | Admitting: Medical

## 2017-11-15 VITALS — BP 138/100 | HR 132 | Temp 98.2°F | Resp 16 | Ht 62.0 in | Wt 165.0 lb

## 2017-11-15 DIAGNOSIS — R0981 Nasal congestion: Secondary | ICD-10-CM | POA: Diagnosis not present

## 2017-11-15 DIAGNOSIS — J40 Bronchitis, not specified as acute or chronic: Secondary | ICD-10-CM

## 2017-11-15 DIAGNOSIS — J0100 Acute maxillary sinusitis, unspecified: Secondary | ICD-10-CM | POA: Diagnosis not present

## 2017-11-15 MED ORDER — AZITHROMYCIN 250 MG PO TABS: ORAL_TABLET | ORAL | 0 refills | Status: DC

## 2017-11-15 MED ORDER — FLUTICASONE PROPIONATE 50 MCG/ACT NA SUSP: 2.0000 | Freq: Every day | NASAL | 1 refills | Status: DC

## 2017-11-15 MED ORDER — BENZONATATE 100 MG PO CAPS: 100.0000 mg | ORAL_CAPSULE | Freq: Three times a day (TID) | ORAL | 0 refills | Status: DC | PRN

## 2017-11-15 NOTE — Patient Instructions (Signed)
   You appear to have bronchitis and sinusitis. Rest hydrate and tylenol for fever. I am prescribing cough medicine benzonatate, and azithromycin antibiotic. For your nasal congestion you could use otc the counter nasal steroid flonase.  You should gradually get better. If not then notify us and would recommend a chest xray.  Follow up in 7-10 days or as needed

## 2017-11-15 NOTE — Progress Notes (Signed)
Subjective:    Patient ID: Sheila Schmidt, female    DOB: February 17, 1936, 82 y.o.   MRN: [id number]  HPI  Pt in with 2 weeks of cough.  Pt states moderate cough. It is gradually getting better. She is taking otc cough drops and vicks syrup.  Pt able to sleep/cough does not wake.   Cough not productive  Some nasal congestion and clear nasal drainage in the morning.  Review of Systems  Constitutional: Positive for fatigue. Negative for chills and fever.       Tired but energy level gradually improving.  HENT: Positive for congestion, postnasal drip, rhinorrhea and sinus pressure. Negative for ear pain, sneezing and tinnitus.   Respiratory: Positive for cough. Negative for chest tightness, shortness of breath and wheezing.   Cardiovascular: Negative for chest pain and palpitations.  Gastrointestinal: Negative for abdominal pain.  Musculoskeletal: Negative for back pain and myalgias.  Neurological: Negative for dizziness, numbness and headaches.  Hematological: Negative for adenopathy. Does not bruise/bleed easily.  Psychiatric/Behavioral: Negative for behavioral problems and confusion.    Past Medical History:  Diagnosis Date  . Cancer ([place])     Left breast carcinoma in situ  . Cholelithiasis   . Depression   . 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 Hx of adenomatous colonic polyps   . Hyperlipidemia      Social History   Socioeconomic History  . Marital status: Married    Spouse name: Not on file  . Number of children: 3  . Years of education: Not on file  . Highest education level: Not on file  Occupational History  . 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 Financial resource strain: Not on file  . Food insecurity:    Worry: Not on file    Inability: Not on file  . Transportation needs:    Medical: Not on file    Non-medical: Not on file  Tobacco Use  . Smoking status: Never  Smoker  . Smokeless tobacco: Never Used  Substance and Sexual Activity  . Alcohol use: Yes    Alcohol/week: 6.0 oz    Types: 10 Glasses of wine per week    Comment: daily  . Drug use: No  . Sexual activity: Not on file  Lifestyle  . Physical activity:    Days per week: Not on file    Minutes per session: Not on file  . Stress: Not on file  Relationships  . Social connections:    Talks on phone: Not on file    Gets together: Not on file    Attends religious service: Not on file    Active member of club or organization: Not on file    Attends meetings of clubs or organizations: Not on file    Relationship status: Not on file  . Intimate partner violence:    Fear of current or ex partner: Not on file    Emotionally abused: Not on file    Physically abused: Not on file    Forced sexual activity: Not on file  Other Topics Concern  . Not on file  Social History Narrative   [employer]. [employer]. Married '59, 3 daughters- '61, '64, '73; 2 grandchildren.Marriage in good health. Exercise - goes to gym 5/wk. ACP - directed to the [email or web address]    Past Surgical History:  Procedure Laterality Date  . BREAST EXCISIONAL BIOPSY Left 2010  benign  . CHOLECYSTECTOMY    . CHOLECYSTECTOMY, LAPAROSCOPIC  '06   [place]  . KNEE ARTHROSCOPY     Left '00 [facility]/ Right '08  . lumpectomy- remote     benign  . TOOTH EXTRACTION    . TOTAL KNEE ARTHROPLASTY  02/05/11   left    Family History  Problem Relation Age of Onset  . Diabetes Mother        Deceased in 42s  . Heart disease Mother        cad/MI-fatal  . Heart attack Mother   . Cancer Father        liver, Deceased in 32s  . Heart disease Sister   . Breast cancer Neg Hx   . Colon cancer Neg Hx     No Known Allergies  Current Outpatient Medications on File Prior to Visit  Medication Sig Dispense Refill  . acetaminophen (TYLENOL) 500 MG tablet Take 500 mg by mouth daily.    . Cholecalciferol 1000 UNITS tablet Take 1,000 Units by  mouth daily.    Marland Kitchen diltiazem (CARDIZEM CD) 300 MG 24 hr capsule TAKE 1 CAPSULE(300 MG) BY MOUTH DAILY 30 capsule 3  . FLUoxetine (PROZAC) 10 MG capsule Take 10 mg by mouth every other day.     Marland Kitchen OVER THE COUNTER MEDICATION at bedtime as needed. Sleep Aid    . triamterene-hydrochlorothiazide (MAXZIDE-25) 37.5-25 MG tablet Take 1/2 or 1 daily as needed for foot swelling 90 tablet 1  . XARELTO 20 MG TABS tablet TAKE 1 TABLET(20 MG) BY MOUTH DAILY WITH SUPPER 90 tablet 3   No current facility-administered medications on file prior to visit.     BP (!) 138/100   Pulse (!) 132   Temp 98.2 F (36.8 C) (Oral)   Resp 16   Ht 5\' 2"  (1.575 m)   Wt 165 lb (74.8 kg)   SpO2 96%   BMI 30.18 kg/m       Objective:   Physical Exam  General  Mental Status - Alert. General Appearance - Well groomed. Not in acute distress.  Skin Rashes- No Rashes.  HEENT Head- Normal. Ear Auditory Canal - Left- Normal. Right - Normal.Tympanic Membrane- Left- Normal. Right- Normal. Eye Sclera/Conjunctiva- Left- Normal. Right- Normal. Nose & Sinuses Nasal Mucosa- Left-  Boggy and Congested. Right-  Boggy and  Congested.Bilateral maxillary and frontal sinus pressure. Mouth & Throat Lips: Upper Lip- Normal: no dryness, cracking, pallor, cyanosis, or vesicular eruption. Lower Lip-Normal: no dryness, cracking, pallor, cyanosis or vesicular eruption. Buccal Mucosa- Bilateral- No Aphthous ulcers. Oropharynx- No Discharge or Erythema. Tonsils: Characteristics- Bilateral- No Erythema or Congestion. Size/Enlargement- Bilateral- No enlargement. Discharge- bilateral-None.  Neck Neck- Supple. No Masses.   Chest and Lung Exam Auscultation: Breath Sounds:-Clear even and unlabored.  Cardiovascular Auscultation:Rythm- Regular, rate and rhythm. Murmurs & Other Heart Sounds:Ausculatation of the heart reveal- No Murmurs.  Lymphatic Head & Neck General Head & Neck Lymphatics: Bilateral: Description- No Localized  lymphadenopathy.       Assessment & Plan:   You appear to have bronchitis and sinusitis. Rest hydrate and tylenol for fever. I am prescribing cough medicine benzonatate, and azithromycin antibiotic. For your nasal congestion you could use otc the counter nasal steroid flonase.  You should gradually get better. If not then notify us and would recommend a chest xray.  Follow up in 7-10 days or as needed  [employer]

## 2017-12-25 ENCOUNTER — Other Ambulatory Visit: Payer: Self-pay | Admitting: Family Medicine

## 2017-12-25 DIAGNOSIS — R600 Localized edema: Secondary | ICD-10-CM

## 2018-02-24 ENCOUNTER — Other Ambulatory Visit: Payer: Self-pay | Admitting: Cardiology

## 2018-02-24 DIAGNOSIS — I480 Paroxysmal atrial fibrillation: Secondary | ICD-10-CM

## 2018-02-24 NOTE — Telephone Encounter (Signed)
This is a [facility] pt 

## 2018-02-24 NOTE — Telephone Encounter (Signed)
This is a Camnitz patient. Thanks! :)

## 2018-02-27 NOTE — Telephone Encounter (Signed)
Ok to fill 

## 2018-03-20 ENCOUNTER — Encounter: Payer: Self-pay | Admitting: Family Medicine

## 2018-03-29 NOTE — Progress Notes (Signed)
[facility] [address] [place], [place] [address] [phone]  Date:  03/31/2018   Name:  Sheila Schmidt   DOB:  January 02, 1936   MRN:  [id number]  PCP:  Darreld Mclean, MD    Chief Complaint: Lab Work and Flu Vaccine   History of Present Illness:  Sheila Schmidt is a 82 y.o. very pleasant female patient who presents with the following:  Following up today History of A fib on anticoagulation, HTN I last saw her in January She is seeing Dr. Curt Bears in November She is doing ok with xarelto, bruising some but no other bleeding   She sees Dr. Curt Bears, from their most recent visit in April: 1.  Permanent atrial fibrillation: On diltiazem and anticoagulation with Xarelto.  She has no symptoms from her atrial fibrillation.  Her heart rate is well controlled.  She is exercising without issue.  No changes at this time.   This patients CHA2DS2-VASc Score and unadjusted Ischemic Stroke Rate (% per year) is equal to 3.2 % stroke rate/year from a score of 3  Above score calculated as 1 point each if present [CHF, HTN, DM, Vascular=MI/PAD/Aortic Plaque, Age if 65-74, or Female] Above score calculated as 2 points each if present [Age > 75, or Stroke/TIA/TE] 2. Hyperlipidemia: He was taken off of her Lipitor by her primary physician.  She will discuss with her further therapies.  Lipids- ? Does she want to go back on lipitor.  We had stopped this as she was concerned about skin changes.  However coming off lipitor did not really change anything.  She is ok with going back on lipitro  Flu shot given today Due for labs, need to check her lipids- she had just a light meal so far today  She also has noted a bump on her lower lip- she is not sure what it is, if she needs to see derm   BP Readings from Last 3 Encounters:  03/31/18 122/70  11/15/17 (!) 138/100  09/25/17 124/82     Patient Active Problem List   Diagnosis  Date Noted  . Paroxysmal atrial fibrillation ([place]) 04/07/2015  . Chronic anticoagulation 04/07/2015  . Essential hypertension, benign 02/24/2014  . Sciatica 12/08/2013  . Hyperlipidemia 09/25/2013  . Osteopenia 11/19/2012  . BACK PAIN 01/26/2010  . Carcinoma in situ of breast 09/04/2009  . DEPRESSION, PROLONGED 09/04/2009  . DEGENERATIVE JOINT DISEASE, KNEES, BILATERAL 09/04/2009  . PERIPHERAL EDEMA 09/04/2009  . COLONIC POLYPS, ADENOMATOUS, HX OF 09/04/2009    Past Medical History:  Diagnosis Date  . Cancer ([place])     Left breast carcinoma in situ  . Cholelithiasis   . Depression   . 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 Hx of adenomatous colonic polyps   . Hyperlipidemia     Past Surgical History:  Procedure Laterality Date  . BREAST EXCISIONAL BIOPSY Left 2010   benign  . CHOLECYSTECTOMY    . CHOLECYSTECTOMY, LAPAROSCOPIC  '06   [place]  . KNEE ARTHROSCOPY     Left '00 [facility]/ Right '08  . lumpectomy- remote     benign  . TOOTH EXTRACTION    . TOTAL KNEE ARTHROPLASTY  02/05/11   left    Social History   Tobacco Use  . Smoking status: Never Smoker  . Smokeless tobacco: Never Used  Substance Use Topics  . Alcohol use: Yes  Alcohol/week: 10.0 standard drinks    Types: 10 Glasses of wine per week    Comment: daily  . Drug use: No    Family History  Problem Relation Age of Onset  . Diabetes Mother        Deceased in 18s  . Heart disease Mother        cad/MI-fatal  . Heart attack Mother   . Cancer Father        liver, Deceased in 80s  . Heart disease Sister   . Breast cancer Neg Hx   . Colon cancer Neg Hx     No Known Allergies  Medication list has been reviewed and updated.  Current Outpatient Medications on File Prior to Visit  Medication Sig Dispense Refill  . acetaminophen (TYLENOL) 500 MG tablet Take 500 mg by mouth daily.    . Cholecalciferol 1000 UNITS tablet Take 1,000 Units by mouth daily.    Marland Kitchen diltiazem (CARDIZEM  CD) 300 MG 24 hr capsule TAKE ONE CAPSULE BY MOUTH DAILY 60 capsule 5  . FLUoxetine (PROZAC) 10 MG capsule Take 10 mg by mouth every other day.     Marland Kitchen OVER THE COUNTER MEDICATION at bedtime as needed. Sleep Aid    . triamterene-hydrochlorothiazide (MAXZIDE-25) 37.5-25 MG tablet TAKE 1/2 OR 1 TABLET DAILY AS NEEDED FOR FOOT SWELLING 90 tablet 1  . XARELTO 20 MG TABS tablet TAKE 1 TABLET(20 MG) BY MOUTH DAILY WITH SUPPER 90 tablet 3   No current facility-administered medications on file prior to visit.     Review of Systems:  As per HPI- otherwise negative.   Physical Examination: Vitals:   03/31/18 1346  BP: 122/70  Pulse: (!) 54  Resp: 16  Temp: 98.1 F (36.7 C)  SpO2: 97%   Vitals:   03/31/18 1346  Weight: 167 lb (75.8 kg)  Height: 5\' 2"  (1.575 m)   Body mass index is 30.54 kg/m. Ideal Body Weight: Weight in (lb) to have BMI = 25: 136.4  GEN: WDWN, NAD, Non-toxic, A & O x 3, looks well, very active for age  46: Atraumatic, Normocephalic. Neck supple. No masses, No LAD.  Bilateral TM wnl, oropharynx normal.  PEERL,EOMI.   She has a large milia on the [place] border of the lower lip  Ears and Nose: No external deformity. CV: RRR, No M/G/R. No JVD. No thrill. No extra heart sounds. PULM: CTA B, no wheezes, crackles, rhonchi. No retractions. No resp. distress. No accessory muscle use. EXTR: No c/c/e NEURO Normal gait.  PSYCH: Normally interactive. Conversant. Not depressed or anxious appearing.  Calm demeanor.   VC obtained.  Used needle to unroof milia and expressed sebaceous material from papule.   Assessment and Plan: Lower extremity edema - Plan: CBC, Comprehensive metabolic panel  Medication monitoring encounter - Plan: Comprehensive metabolic panel  Immunization due  History of atrial fibrillation  Dyslipidemia - Plan: Lipid panel, atorvastatin (LIPITOR) 20 MG tablet  Edema is under control with maxzide  Continue her anticoagulation with xarelto-  provided savings card and discussed shopping around different pharmacies as this drug is hard for her to afford Will put her back on lipitor as coming off did not resolve her skin concerns  Signed Lamar Blinks, MD  Received her labs, message to pt   Your cholesterol is high- we will have you go back on atorvastatin as discussed and see how things look in a few months  For some reason your white cell count is a bit high  today.  This could be due to recent stress or illness perhaps- I will order a repeat CBc for you, please make a lab appt only for about one month from now and we will recheck.  Likely it will come back to normal Your metabolic profile looks ok- BUN is slightly high but this may be due to not eating/ drinking much prior to our visit today Let's plan on a routine visit in 6 months, take care JC   Results for orders placed or performed in visit on 03/31/18  CBC  Result Value Ref Range   WBC 14.1 (H) 4.0 - 10.5 K/uL   RBC 4.21 3.87 - 5.11 Mil/uL   Platelets 244.0 150.0 - 400.0 K/uL   Hemoglobin 14.3 12.0 - 15.0 g/dL   HCT 42.4 36.0 - 46.0 %   MCV 100.7 (H) 78.0 - 100.0 fl   MCHC 33.7 30.0 - 36.0 g/dL   RDW 12.9 11.5 - 15.5 %  Comprehensive metabolic panel  Result Value Ref Range   Sodium 137 135 - 145 mEq/L   Potassium 4.3 3.5 - 5.1 mEq/L   Chloride 100 96 - 112 mEq/L   CO2 28 19 - 32 mEq/L   Glucose, Bld 82 70 - 99 mg/dL   BUN 25 (H) 6 - 23 mg/dL   Creatinine, Ser 1.04 0.40 - 1.20 mg/dL   Total Bilirubin 0.9 0.2 - 1.2 mg/dL   Alkaline Phosphatase 54 39 - 117 U/L   AST 13 0 - 37 U/L   ALT 11 0 - 35 U/L   Total Protein 7.0 6.0 - 8.3 g/dL   Albumin 4.1 3.5 - 5.2 g/dL   Calcium 10.0 8.4 - 10.5 mg/dL   GFR 54.17 (L) >60.00 mL/min  Lipid panel  Result Value Ref Range   Cholesterol 234 (H) 0 - 200 mg/dL   Triglycerides 89.0 0.0 - 149.0 mg/dL   HDL 58.20 >39.00 mg/dL   VLDL 17.8 0.0 - 40.0 mg/dL   LDL Cholesterol 158 (H) 0 - 99 mg/dL   Total CHOL/HDL Ratio 4     NonHDL 175.62    Message to pt

## 2018-03-31 ENCOUNTER — Ambulatory Visit: Payer: Medicare Other | Admitting: Family Medicine

## 2018-03-31 ENCOUNTER — Encounter: Payer: Self-pay | Admitting: Family Medicine

## 2018-03-31 VITALS — BP 122/70 | HR 54 | Temp 98.1°F | Resp 16 | Ht 62.0 in | Wt 167.0 lb

## 2018-03-31 DIAGNOSIS — R600 Localized edema: Secondary | ICD-10-CM

## 2018-03-31 DIAGNOSIS — Z5181 Encounter for therapeutic drug level monitoring: Secondary | ICD-10-CM | POA: Diagnosis not present

## 2018-03-31 DIAGNOSIS — Z23 Encounter for immunization: Secondary | ICD-10-CM | POA: Diagnosis not present

## 2018-03-31 DIAGNOSIS — E785 Hyperlipidemia, unspecified: Secondary | ICD-10-CM | POA: Diagnosis not present

## 2018-03-31 DIAGNOSIS — Z8679 Personal history of other diseases of the circulatory system: Secondary | ICD-10-CM

## 2018-03-31 DIAGNOSIS — D72829 Elevated white blood cell count, unspecified: Secondary | ICD-10-CM

## 2018-03-31 LAB — LIPID PANEL
CHOLESTEROL: 234 mg/dL — AB (ref 0–200)
HDL: 58.2 mg/dL (ref >39.00)
LDL Cholesterol: 158 mg/dL — ABNORMAL HIGH (ref 0–99)
NonHDL: 175.62
TRIGLYCERIDES: 89 mg/dL (ref 0.0–149.0)
Total CHOL/HDL Ratio: 4
VLDL: 17.8 mg/dL (ref 0.0–40.0)

## 2018-03-31 LAB — COMPREHENSIVE METABOLIC PANEL
ALBUMIN: 4.1 g/dL (ref 3.5–5.2)
ALK PHOS: 54 U/L (ref 39–117)
ALT: 11 U/L (ref 0–35)
AST: 13 U/L (ref 0–37)
BILIRUBIN TOTAL: 0.9 mg/dL (ref 0.2–1.2)
BUN: 25 mg/dL — ABNORMAL HIGH (ref 6–23)
CALCIUM: 10 mg/dL (ref 8.4–10.5)
CO2: 28 mEq/L (ref 19–32)
CREATININE: 1.04 mg/dL (ref 0.40–1.20)
Chloride: 100 mEq/L (ref 96–112)
GFR: 54.17 mL/min — ABNORMAL LOW (ref >60.00)
Glucose, Bld: 82 mg/dL (ref 70–99)
Potassium: 4.3 mEq/L (ref 3.5–5.1)
SODIUM: 137 meq/L (ref 135–145)
TOTAL PROTEIN: 7 g/dL (ref 6.0–8.3)

## 2018-03-31 LAB — CBC
HCT: 42.4 % (ref 36.0–46.0)
Hemoglobin: 14.3 g/dL (ref 12.0–15.0)
MCHC: 33.7 g/dL (ref 30.0–36.0)
MCV: 100.7 fl — ABNORMAL HIGH (ref 78.0–100.0)
PLATELETS: 244 10*3/uL (ref 150.0–400.0)
RBC: 4.21 Mil/uL (ref 3.87–5.11)
RDW: 12.9 % (ref 11.5–15.5)
WBC: 14.1 10*3/uL — AB (ref 4.0–10.5)

## 2018-03-31 MED ORDER — ATORVASTATIN CALCIUM 20 MG PO TABS: 20.0000 mg | ORAL_TABLET | Freq: Every day | ORAL | 3 refills | Status: DC

## 2018-03-31 NOTE — Patient Instructions (Addendum)
Good to see you today!  I refilled your atorvastatin (lipitor) for your cholesterol- take one a day See if you can use the xarelto savings card I gave you.  If not you might want to check prices at [facility], [facility] for your medication.  Or [facility]  I'll be in touch with your labs asap

## 2018-04-18 ENCOUNTER — Encounter: Payer: Self-pay | Admitting: Cardiology

## 2018-04-21 ENCOUNTER — Ambulatory Visit (INDEPENDENT_AMBULATORY_CARE_PROVIDER_SITE_OTHER): Payer: Medicare Other | Admitting: Cardiology

## 2018-04-21 ENCOUNTER — Encounter: Payer: Self-pay | Admitting: Cardiology

## 2018-04-21 VITALS — BP 116/66 | HR 66 | Ht 62.0 in | Wt 166.0 lb

## 2018-04-21 DIAGNOSIS — E785 Hyperlipidemia, unspecified: Secondary | ICD-10-CM

## 2018-04-21 DIAGNOSIS — I4821 Permanent atrial fibrillation: Secondary | ICD-10-CM

## 2018-04-21 NOTE — Patient Instructions (Signed)
Medication Instructions:  Your physician recommends that you continue on your current medications as directed. Please refer to the Current Medication list given to you today.  If you need a refill on your cardiac medications before your next appointment, please call your pharmacy.   Labwork: None ordered  Testing/Procedures: None ordered  Follow-Up: Your physician wants you to follow-up in: 6 months with Dr. Camnitz.  You will receive a reminder letter in the mail two months in advance. If you don't receive a letter, please call our office to schedule the follow-up appointment.  Thank you for choosing [facility]!!   Yehonatan Grandison, RN [phone]         

## 2018-04-21 NOTE — Progress Notes (Signed)
Electrophysiology Office Note   Date:  04/21/2018   ID:  TERRIN IMPARATO, DOB [birth year], MRN [id number]  PCP:  Darreld Mclean, MD  Cardiologist:  Marlou Porch Primary Electrophysiologist:  Kebin Maye Meredith Leeds, MD    No chief complaint on file.    History of Present Illness: Sheila Schmidt is a 82 y.o. female who presents today for electrophysiology evaluation.   History of atrial fibrillation, atrial flutter, HTN, HLD presenting for workup of atrial fibrillation. She has refused rhythm control in the past.   Today, denies symptoms of palpitations, chest pain, shortness of breath, orthopnea, PND, lower extremity edema, claudication, dizziness, presyncope, syncope, bleeding, or neurologic sequela. The patient is tolerating medications without difficulties.  Overall she is doing well.  She has no chest pain or shortness of breath.  She is unaware of her atrial fibrillation.  She is able to exercise without issue.  Past Medical History:  Diagnosis Date  . Cancer ([place])     Left breast carcinoma in situ  . Cholelithiasis   . Depression   . 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 Hx of adenomatous colonic polyps   . Hyperlipidemia    Past Surgical History:  Procedure Laterality Date  . BREAST EXCISIONAL BIOPSY Left 2010   benign  . CHOLECYSTECTOMY    . CHOLECYSTECTOMY, LAPAROSCOPIC  '06   [place]  . KNEE ARTHROSCOPY     Left '00 [facility]/ Right '08  . lumpectomy- remote     benign  . TOOTH EXTRACTION    . TOTAL KNEE ARTHROPLASTY  02/05/11   left     Current Outpatient Medications  Medication Sig Dispense Refill  . acetaminophen (TYLENOL) 500 MG tablet Take 500 mg by mouth daily.    Marland Kitchen atorvastatin (LIPITOR) 20 MG tablet Take 1 tablet (20 mg total) by mouth daily. 90 tablet 3  . Cholecalciferol 1000 UNITS tablet Take 1,000 Units by mouth daily.    Marland Kitchen diltiazem (CARDIZEM CD) 300 MG 24 hr capsule TAKE ONE CAPSULE BY MOUTH DAILY 60 capsule 5  . FLUoxetine  (PROZAC) 10 MG capsule Take 10 mg by mouth every other day.     Marland Kitchen OVER THE COUNTER MEDICATION at bedtime as needed. Sleep Aid    . triamterene-hydrochlorothiazide (MAXZIDE-25) 37.5-25 MG tablet TAKE 1/2 OR 1 TABLET DAILY AS NEEDED FOR FOOT SWELLING 90 tablet 1  . XARELTO 20 MG TABS tablet TAKE 1 TABLET(20 MG) BY MOUTH DAILY WITH SUPPER 90 tablet 3   No current facility-administered medications for this visit.     Allergies:   Patient has no known allergies.   Social History:  The patient  reports that she has never smoked. She has never used smokeless tobacco. She reports that she drinks about 10.0 standard drinks of alcohol per week. She reports that she does not use drugs.   Family History:  The patient's family history includes Cancer in her father; Diabetes in her mother; Heart attack in her mother; Heart disease in her mother and sister.   ROS:  Please see the history of present illness.   Otherwise, review of systems is positive for none.   All other systems are reviewed and negative.   PHYSICAL EXAM: VS:  BP 116/66   Pulse 66   Ht 5\' 2"  (1.575 m)   Wt 166 lb (75.3 kg)   SpO2 99%   BMI 30.36 kg/m  , BMI Body mass index is 30.36 kg/m. GEN: Well nourished, well  developed, in no acute distress  HEENT: normal  Neck: no JVD, carotid bruits, or masses Cardiac: iRRR; no murmurs, rubs, or gallops,no edema  Respiratory:  clear to auscultation bilaterally, normal work of breathing GI: soft, nontender, nondistended, + BS MS: no deformity or atrophy  Skin: warm and dry Neuro:  Strength and sensation are intact Psych: euthymic mood, full affect  EKG:  EKG is not ordered today. Personal review of the ekg ordered 09/25/17 shows atrial fibrillation interventricular conduction delay  Recent Labs: 03/31/2018: ALT 11; BUN 25; Creatinine, Ser 1.04; Hemoglobin 14.3; Platelets 244.0; Potassium 4.3; Sodium 137    Lipid Panel     Component Value Date/Time   CHOL 234 (H) 03/31/2018 1427    TRIG 89.0 03/31/2018 1427   HDL 58.20 03/31/2018 1427   CHOLHDL 4 03/31/2018 1427   VLDL 17.8 03/31/2018 1427   LDLCALC 158 (H) 03/31/2018 1427     Wt Readings from Last 3 Encounters:  04/21/18 166 lb (75.3 kg)  03/31/18 167 lb (75.8 kg)  11/15/17 165 lb (74.8 kg)      Other studies Reviewed: Additional studies/ records that were reviewed today include: TTE 2016, Myoview 2017, Holter 01/18/16  Review of the above records today demonstrates:  - Left ventricle: The cavity size was normal. Systolic function was   normal. The estimated ejection fraction was in the range of 50%   to 55%. Hypokinesis of the anteroseptal and inferoseptal   myocardium. The study is not technically sufficient to allow   evaluation of LV diastolic function. - Aortic valve: There was mild regurgitation. - Mitral valve: There was trivial regurgitation. - Left atrium: The atrium was moderately dilated. - Right ventricle: The cavity size was normal. Wall thickness was   normal. Systolic function was normal. - Pulmonic valve: There was mild regurgitation.   There was no ST segment deviation noted during stress.  This is a low risk study. Mild anteroseptal decreased radiotracer uptake seen at both rest and stress consistent with left bundle branch block artifact. No ischemia identified.  Atrial fibrillation-non-gated study, no ejection fraction calculated.   Atrial fibrillation / flutter noted with variable conduction  Mean heart rate 99 BPM  No pauses greater than 3 seconds  PVC's occasional (240) noted  ASSESSMENT AND PLAN:  1.  Permanent atrial fibrillation: Currently on diltiazem and anticoagulated with Xarelto.  She has no symptoms from her atrial fibrillation.  She is able to do all of her heart rate is well controlled.  No changes.  This patients CHA2DS2-VASc Score and unadjusted Ischemic Stroke Rate (% per year) is equal to 3.2 % stroke rate/year from a score of 3  Above score calculated as  1 point each if present [CHF, HTN, DM, Vascular=MI/PAD/Aortic Plaque, Age if 65-74, or Female] Above score calculated as 2 points each if present [Age > 75, or Stroke/TIA/TE]    2. Hyperlipidemia: Currently on Lipitor.  No changes.  Plan per primary physician.   Current medicines are reviewed at length with the patient today.   The patient does not have concerns regarding her medicines.  The following changes were made today: None  Labs/ tests ordered today include:  No orders of the defined types were placed in this encounter.    Disposition:   FU with Naithan Delage 6 months  Signed, Nithila Sumners Meredith Leeds, MD  04/21/2018 3:44 PM     [facility] [address] [place] [address] [phone] (office) [phone] (fax)

## 2018-05-05 ENCOUNTER — Ambulatory Visit: Payer: Medicare Other | Admitting: Cardiology

## 2018-06-24 ENCOUNTER — Other Ambulatory Visit: Payer: Self-pay | Admitting: Family Medicine

## 2018-06-24 DIAGNOSIS — R600 Localized edema: Secondary | ICD-10-CM

## 2018-07-08 ENCOUNTER — Other Ambulatory Visit (INDEPENDENT_AMBULATORY_CARE_PROVIDER_SITE_OTHER): Payer: Self-pay | Admitting: *Deleted

## 2018-07-08 ENCOUNTER — Telehealth (INDEPENDENT_AMBULATORY_CARE_PROVIDER_SITE_OTHER): Payer: Self-pay | Admitting: Orthopaedic Surgery

## 2018-07-08 MED ORDER — CEPHALEXIN 500 MG PO CAPS: ORAL_CAPSULE | ORAL | 0 refills | Status: DC

## 2018-07-08 NOTE — Telephone Encounter (Signed)
thanks

## 2018-07-08 NOTE — Telephone Encounter (Signed)
FYI - RX sent for Cephalexin - Take 4 capsules 1 hour prior to dental procedures, I called patient.

## 2018-07-08 NOTE — Telephone Encounter (Signed)
Patient called requesting prescription refill of Cephalexin to be sent to [facility] on [address] in [place].  Patient states she uses the prescription for dental appointments.  Patient states she had knee replacement in 2010.

## 2018-07-18 NOTE — Telephone Encounter (Signed)
[email or web address]

## 2018-07-29 NOTE — Progress Notes (Signed)
[place] [facility] [address] [place], [place] [address] [phone]  Date:  07/31/2018   Name:  Sheila Schmidt   DOB:  December 28, 1935   MRN:  [id number]  PCP:  Darreld Mclean, MD    Chief Complaint: Clogged Ears (clogged ears after sleeping-fluid?, off and on several weeks, bilateral, congestion)   History of Present Illness:  Sheila Schmidt is a 83 y.o. very pleasant female patient who presents with the following:  Patient history of hypertension, paroxysmal A. fib, breast cancer.  She is on Xarelto, diltiazem, Maxide.  Here today with concern about her ears  I last saw her in October  She notes that both of her ears feel uncomfortable, like they need to pop She was in [place] the [place] and felt [place] and her ears started to feel congested She will sometimes get a "ticking" in her right ear- this is not new however She can hear pretty well  Her ear she is feeling very stuffy She did an ear candle at home recently, and got quite a bit of wax  She does not have diabetes No fever or chills, no significant cough Mild runny nose Patient Active Problem List   Diagnosis Date Noted  . Paroxysmal atrial fibrillation ([place]) 04/07/2015  . Chronic anticoagulation 04/07/2015  . Essential hypertension, benign 02/24/2014  . Sciatica 12/08/2013  . Hyperlipidemia 09/25/2013  . Osteopenia 11/19/2012  . BACK PAIN 01/26/2010  . Carcinoma in situ of breast 09/04/2009  . DEPRESSION, PROLONGED 09/04/2009  . DEGENERATIVE JOINT DISEASE, KNEES, BILATERAL 09/04/2009  . PERIPHERAL EDEMA 09/04/2009  . COLONIC POLYPS, ADENOMATOUS, HX OF 09/04/2009    Past Medical History:  Diagnosis Date  . Cancer ([place])     Left breast carcinoma in situ  . Cholelithiasis   . Depression   . 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 Hx of adenomatous colonic polyps   .  Hyperlipidemia     Past Surgical History:  Procedure Laterality Date  . BREAST EXCISIONAL BIOPSY Left 2010   benign  . CHOLECYSTECTOMY    . CHOLECYSTECTOMY, LAPAROSCOPIC  '06   [place]  . KNEE ARTHROSCOPY     Left '00 [place]/ Right '08  . lumpectomy- remote     benign  . TOOTH EXTRACTION    . TOTAL KNEE ARTHROPLASTY  02/05/11   left    Social History   Tobacco Use  . Smoking status: Never Smoker  . Smokeless tobacco: Never Used  Substance Use Topics  . Alcohol use: Yes    Alcohol/week: 10.0 standard drinks    Types: 10 Glasses of wine per week    Comment: daily  . Drug use: No    Family History  Problem Relation Age of Onset  . Diabetes Mother        Deceased in 74s  . Heart disease Mother        cad/MI-fatal  . Heart attack Mother   . Cancer Father        liver, Deceased in 28s  . Heart disease Sister   . Breast cancer Neg Hx   . Colon cancer Neg Hx     No Known Allergies  Medication list has been reviewed and updated.  Current Outpatient Medications on File Prior to Visit  Medication Sig Dispense Refill  . Cholecalciferol 1000 UNITS tablet  Take 1,000 Units by mouth daily.    Marland Kitchen diltiazem (CARDIZEM CD) 300 MG 24 hr capsule TAKE ONE CAPSULE BY MOUTH DAILY 60 capsule 5  . FLUoxetine (PROZAC) 10 MG capsule Take 10 mg by mouth every other day.     Marland Kitchen OVER THE COUNTER MEDICATION at bedtime as needed. Sleep Aid    . triamterene-hydrochlorothiazide (MAXZIDE-25) 37.5-25 MG tablet TAKE 1/2 OR 1 TABLET DAILY AS NEEDED FOR FOOT SWELLING 90 tablet 1  . XARELTO 20 MG TABS tablet TAKE 1 TABLET(20 MG) BY MOUTH DAILY WITH SUPPER 90 tablet 3   No current facility-administered medications on file prior to visit.     Review of Systems:  As per HPI- otherwise negative.   Physical Examination: Vitals:   07/31/18 1424  BP: 122/80  Pulse: 60  Resp: 16  Temp: 98.1 F (36.7 C)  SpO2: 98%   Vitals:   07/31/18 1424  Weight: 168 lb (76.2 kg)  Height: 5\' 2"  (1.575 m)    Body mass index is 30.73 kg/m. Ideal Body Weight: Weight in (lb) to have BMI = 25: 136.4  GEN: WDWN, NAD, Non-toxic, A & O x 3, overweight, looks well  HEENT: Atraumatic, Normocephalic. Neck supple. No masses, No LAD.  Bilateral TM obscured partially by cerumen, oropharynx normal.  PEERL,EOMI.   Ears and Nose: No external deformity. CV: RRR, No M/G/R. No JVD. No thrill. No extra heart sounds. PULM: CTA B, no wheezes, crackles, rhonchi. No retractions. No resp. distress. No accessory muscle use. ABD: S, NT, ND, +BS. No rebound. No HSM. EXTR: No c/c/e NEURO Normal gait.  PSYCH: Normally interactive. Conversant. Not depressed or anxious appearing.  Calm demeanor.  Irrigated both ears and removed some wax  TM then appeared normal   Assessment and Plan: Dysfunction of both eustachian tubes - Plan: DISCONTINUED: predniSONE (DELTASONE) 20 MG tablet  Here today with ear congestion, likely due to eustachian tube dysfunction.  Prescription for 20 mg of prednisone daily for 5 days.  She will let me know if not helpful  Signed Lamar Blinks, MD

## 2018-07-30 NOTE — Progress Notes (Addendum)
Subjective:   Sheila Schmidt is a 83 y.o. female who presents for an Initial Medicare Annual Wellness Visit.  Review of Systems   No ROS.  Medicare Wellness Visit. Additional risk factors are reflected in the social history. Cardiac Risk Factors include: advanced age (>37men, >51 women);hypertension;dyslipidemia Sleep patterns:   No issues Home Safety/Smoke Alarms: Feels safe in home. Smoke alarms in place.  Lives with husband in 1 story with basement. Walk-in shower with grab rail.   Female:   Mammo-08/21/17       Dexa scan-  07/07/13. Pt states she prefers to schedule with Dr.Wein         Objective:    Today's Vitals   07/31/18 1441  BP: 122/80  Pulse: 60  SpO2: 98%  Weight: 168 lb (76.2 kg)  Height: 5\' 2"  (1.575 m)   Body mass index is 30.73 kg/m.  Advanced Directives 07/31/2018 03/16/2015  Does Patient Have a Medical Advance Directive? No No  Does patient want to make changes to medical advance directive? Yes (MAU/Ambulatory/Procedural Areas - Information given) -  Would patient like information on creating a medical advance directive? - No - patient declined information    Current Medications (verified) Outpatient Encounter Medications as of 07/31/2018  Medication Sig  . Cholecalciferol 1000 UNITS tablet Take 1,000 Units by mouth daily.  Marland Kitchen diltiazem (CARDIZEM CD) 300 MG 24 hr capsule TAKE ONE CAPSULE BY MOUTH DAILY  . FLUoxetine (PROZAC) 10 MG capsule Take 10 mg by mouth every other day.   Marland Kitchen OVER THE COUNTER MEDICATION at bedtime as needed. Sleep Aid  . triamterene-hydrochlorothiazide (MAXZIDE-25) 37.5-25 MG tablet TAKE 1/2 OR 1 TABLET DAILY AS NEEDED FOR FOOT SWELLING  . XARELTO 20 MG TABS tablet TAKE 1 TABLET(20 MG) BY MOUTH DAILY WITH SUPPER  . predniSONE (DELTASONE) 5 MG tablet Take 1 tablet (5 mg total) by mouth daily with breakfast.  . [DISCONTINUED] acetaminophen (TYLENOL) 500 MG tablet Take 500 mg by mouth daily.  . [DISCONTINUED] atorvastatin (LIPITOR) 20 MG  tablet Take 1 tablet (20 mg total) by mouth daily.  . [DISCONTINUED] cephALEXin (KEFLEX) 500 MG capsule Take 4 capsules 1 hour prior to dental procedures.   No facility-administered encounter medications on file as of 07/31/2018.     Allergies (verified) Patient has no known allergies.   History: Past Medical History:  Diagnosis Date  . Cancer ([place])     Left breast carcinoma in situ  . Cholelithiasis   . Depression   . 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 Hx of adenomatous colonic polyps   . Hyperlipidemia    Past Surgical History:  Procedure Laterality Date  . BREAST EXCISIONAL BIOPSY Left 2010   benign  . CHOLECYSTECTOMY    . CHOLECYSTECTOMY, LAPAROSCOPIC  '06   [place]  . KNEE ARTHROSCOPY     Left '00 [place]/ Right '08  . lumpectomy- remote     benign  . TOOTH EXTRACTION    . TOTAL KNEE ARTHROPLASTY  02/05/11   left   Family History  Problem Relation Age of Onset  . Diabetes Mother        Deceased in 75s  . Heart disease Mother        cad/MI-fatal  . Heart attack Mother   . Cancer Father        liver, Deceased in 78s  . Heart disease Sister   . Breast cancer Neg Hx   . Colon cancer Neg Hx  Social History   Socioeconomic History  . Marital status: Married    Spouse name: Not on file  . Number of children: 3  . Years of education: Not on file  . Highest education level: Not on file  Occupational History  . 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 Financial resource strain: Not on file  . Food insecurity:    Worry: Not on file    Inability: Not on file  . Transportation needs:    Medical: Not on file    Non-medical: Not on file  Tobacco Use  . Smoking status: Never Smoker  . Smokeless tobacco: Never Used  Substance and Sexual Activity  . Alcohol use: Yes    Comment: 1 glass per day  . Drug use: No  . Sexual activity: Not on file  Lifestyle  . Physical activity:      Days per week: Not on file    Minutes per session: Not on file  . Stress: Not on file  Relationships  . Social connections:    Talks on phone: Not on file    Gets together: Not on file    Attends religious service: Not on file    Active member of club or organization: Not on file    Attends meetings of clubs or organizations: Not on file    Relationship status: Not on file  Other Topics Concern  . Not on file  Social History Narrative   [employer]. Married '59, 3 daughters- '61, '64, '73; 2 grandchildren.Marriage in good health. Exercise - goes to gym 5/wk. ACP - directed to the [email or web address]    Tobacco Counseling Counseling given: Not Answered   Clinical Intake:     Pain : No/denies pain                  Activities of Daily Living In your present state of health, do you have any difficulty performing the following activities: 07/31/2018  Hearing? N  Vision? N  Difficulty concentrating or making decisions? N  Comment plays games on Ipad  Walking or climbing stairs? N  Dressing or bathing? N  Doing errands, shopping? N  Preparing Food and eating ? N  Using the Toilet? N  In the past six months, have you accidently leaked urine? N  Do you have problems with loss of bowel control? N  Managing your Medications? N  Managing your Finances? N  Housekeeping or managing your Housekeeping? N  Some recent data might be hidden     Immunizations and Health Maintenance Immunization History  Administered Date(s) Administered  . Influenza Split 04/23/2011, 04/02/2012  . Influenza Whole 03/15/2009  . Influenza, High Dose Seasonal PF 03/23/2013, 07/18/2016, 06/12/2017, 03/31/2018  . Influenza,inj,Quad PF,6+ Mos 02/24/2014  . Influenza-Unspecified 03/11/2014  . Pneumococcal Polysaccharide-23 04/02/2012  . Td 08/11/2002  . Tdap 09/25/2013  . Zoster 09/21/2010   There are no preventive care reminders to display for this patient.  Patient Care  Team: Copland, Gay Filler, MD as PCP - General (Family Medicine) Constance Haw, MD as PCP - Electrophysiology (Cardiology) Magrinat, Virgie Dad, MD (Hematology and Oncology) Garald Balding, MD (Orthopedic Surgery) Vania Rea, MD (Obstetrics and Gynecology) Erroll Luna, MD (General Surgery) Deirdre Pippins, PA-C as Physician Assistant (Dermatology)  Indicate any recent Medical Services you may have received from other than [facility] providers in the past year (date may be approximate).     Assessment:  This is a routine wellness examination for [place]. Physical assessment deferred to PCP.  Hearing/Vision screen  Visual Acuity Screening   Right eye Left eye Both eyes  Without correction: 20/40 20/40 20/40   With correction:     Hearing Screening Comments: Here to see pcp for clogged ears. States she would not want to see audiology    Dietary issues and exercise activities discussed: Current Exercise Habits: Structured exercise class, Time (Minutes): 60, Frequency (Times/Week): 5, Weekly Exercise (Minutes/Week): 300, Intensity: Mild, Exercise limited by: None identified Diet (meal preparation, eat out, water intake, caffeinated beverages, dairy products, fruits and vegetables): well balanced, on average, 3 meals per day   Goals    . Maintain healthy active lifestyle.      Depression Screen PHQ 2/9 Scores 07/31/2018 04/26/2016 12/07/2014  PHQ - 2 Score 0 0 0    Fall Risk Fall Risk  07/31/2018 04/26/2016 01/23/2016 12/07/2014 06/08/2014  Falls in the past year? 0 No No No No   Cognitive Function: MMSE - Mini Mental State Exam 07/31/2018  Orientation to time 5  Orientation to Place 5  Registration 3  Attention/ Calculation 5  Recall 3  Language- name 2 objects 2  Language- repeat 1  Language- follow 3 step command 3  Language- read & follow direction 1  Write a sentence 1  Copy design 1  Total score 30        Screening Tests Health Maintenance  Topic Date Due   . TETANUS/TDAP  09/26/2023  . INFLUENZA VACCINE  Completed  . DEXA SCAN  Completed  . PNA vac Low Risk Adult  Completed     Plan:    Please schedule your next medicare wellness visit with me in 1 yr.  Continue to eat heart healthy diet (full of fruits, vegetables, whole grains, lean protein, water--limit salt, fat, and sugar intake) and increase physical activity as tolerated.  Continue doing brain stimulating activities (puzzles, reading, adult coloring books, staying active) to keep memory sharp.   Bring a copy of your living will and/or healthcare power of attorney to your next office visit.    I have personally reviewed and noted the following in the patient's chart:   . Medical and social history . Use of alcohol, tobacco or illicit drugs  . Current medications and supplements . Functional ability and status . Nutritional status . Physical activity . Advanced directives . List of other physicians . Hospitalizations, surgeries, and ER visits in previous 12 months . Vitals . Screenings to include cognitive, depression, and falls . Referrals and appointments  In addition, I have reviewed and discussed with patient certain preventive protocols, quality metrics, and best practice recommendations. A written personalized care plan for preventive services as well as general preventive health recommendations were provided to patient.     Shela Nevin, [place]   07/31/2018    I have reviewed the above Medicare wellness exam note by Ms. Vevelyn Royals, and agree with her documentation Denny Peon MD

## 2018-07-31 ENCOUNTER — Telehealth: Payer: Self-pay | Admitting: Family Medicine

## 2018-07-31 ENCOUNTER — Encounter: Payer: Self-pay | Admitting: Family Medicine

## 2018-07-31 ENCOUNTER — Ambulatory Visit (INDEPENDENT_AMBULATORY_CARE_PROVIDER_SITE_OTHER): Payer: Medicare Other | Admitting: *Deleted

## 2018-07-31 ENCOUNTER — Ambulatory Visit: Payer: Medicare Other | Admitting: Family Medicine

## 2018-07-31 ENCOUNTER — Encounter: Payer: Self-pay | Admitting: *Deleted

## 2018-07-31 VITALS — BP 122/80 | HR 60 | Temp 98.1°F | Resp 16 | Ht 62.0 in | Wt 168.0 lb

## 2018-07-31 VITALS — BP 122/80 | HR 60 | Ht 62.0 in | Wt 168.0 lb

## 2018-07-31 DIAGNOSIS — H6983 Other specified disorders of Eustachian tube, bilateral: Secondary | ICD-10-CM | POA: Diagnosis not present

## 2018-07-31 DIAGNOSIS — Z0000 Encounter for general adult medical examination without abnormal findings: Secondary | ICD-10-CM | POA: Diagnosis not present

## 2018-07-31 MED ORDER — PREDNISONE 20 MG PO TABS: ORAL_TABLET | ORAL | 0 refills | Status: DC

## 2018-07-31 MED ORDER — PREDNISONE 5 MG PO TABS: 5.0000 mg | ORAL_TABLET | Freq: Every day | ORAL | 0 refills | Status: DC

## 2018-07-31 NOTE — Telephone Encounter (Signed)
Called and corrected error  Meds ordered this encounter  Medications  . predniSONE (DELTASONE) 20 MG tablet    Sig: Take once a day for 5 days    Dispense:  5 tablet    Refill:  0

## 2018-07-31 NOTE — Telephone Encounter (Signed)
Sheila Schmidt, [facility] from [place] called to get clarification on the correct prescription they received for Prednisone. She says the received Prednisone 20 mg x 5 days first, then about 20 minutes later received Prednisione 5 mg daily x 5 days. She is asking for clarification on which one is the patient needing. I advised I was unable to clearly explain by the notes of Dr. Lorelei Pont, so I will send to her for clarification and someone will either call to clarify or send in a new Rx, Sheila Schmidt verbalized understanding.

## 2018-07-31 NOTE — Patient Instructions (Signed)
We will try a course of prednisone for 5 days- let me know if this does not help!  I gave you some samples of xarelto to use

## 2018-07-31 NOTE — Patient Instructions (Signed)
Please schedule your next medicare wellness visit with me in 1 yr.  Continue to eat heart healthy diet (full of fruits, vegetables, whole grains, lean protein, water--limit salt, fat, and sugar intake) and increase physical activity as tolerated.  Continue doing brain stimulating activities (puzzles, reading, adult coloring books, staying active) to keep memory sharp.   Bring a copy of your living will and/or healthcare power of attorney to your next office visit.   Sheila Schmidt , Thank you for taking time to come for your Medicare Wellness Visit. I appreciate your ongoing commitment to your health goals. Please review the following plan we discussed and let me know if I can assist you in the future.   These are the goals we discussed: Goals    . Maintain healthy active lifestyle.       This is a list of the screening recommended for you and due dates:  Health Maintenance  Topic Date Due  . Tetanus Vaccine  09/26/2023  . Flu Shot  Completed  . DEXA scan (bone density measurement)  Completed  . Pneumonia vaccines  Completed    Health Maintenance After Age 64 After age 41, you are at a higher risk for certain long-term diseases and infections as well as injuries from falls. Falls are a major cause of broken bones and head injuries in people who are older than age 83. Getting regular preventive care can help to keep you healthy and well. Preventive care includes getting regular testing and making lifestyle changes as recommended by your health care provider. Talk with your health care provider about:  Which screenings and tests you should have. A screening is a test that checks for a disease when you have no symptoms.  A diet and exercise plan that is right for you. What should I know about screenings and tests to prevent falls? Screening and testing are the best ways to find a health problem early. Early diagnosis and treatment give you the best chance of managing medical conditions  that are common after age 14. Certain conditions and lifestyle choices may make you more likely to have a fall. Your health care provider may recommend:  Regular vision checks. Poor vision and conditions such as cataracts can make you more likely to have a fall. If you wear glasses, make sure to get your prescription updated if your vision changes.  Medicine review. Work with your health care provider to regularly review all of the medicines you are taking, including over-the-counter medicines. Ask your health care provider about any side effects that may make you more likely to have a fall. Tell your health care provider if any medicines that you take make you feel dizzy or sleepy.  Osteoporosis screening. Osteoporosis is a condition that causes the bones to get weaker. This can make the bones weak and cause them to break more easily.  Blood pressure screening. Blood pressure changes and medicines to control blood pressure can make you feel dizzy.  Strength and balance checks. Your health care provider may recommend certain tests to check your strength and balance while standing, walking, or changing positions.  Foot health exam. Foot pain and numbness, as well as not wearing proper footwear, can make you more likely to have a fall.  Depression screening. You may be more likely to have a fall if you have a fear of falling, feel emotionally low, or feel unable to do activities that you used to do.  Alcohol use screening. Using too much alcohol  can affect your balance and may make you more likely to have a fall. What actions can I take to lower my risk of falls? General instructions  Talk with your health care provider about your risks for falling. Tell your health care provider if: ? You fall. Be sure to tell your health care provider about all falls, even ones that seem minor. ? You feel dizzy, sleepy, or off-balance.  Take over-the-counter and prescription medicines only as told by your  health care provider. These include any supplements.  Eat a healthy diet and maintain a healthy weight. A healthy diet includes low-fat dairy products, low-fat (lean) meats, and fiber from whole grains, beans, and lots of fruits and vegetables. Home safety  Remove any tripping hazards, such as rugs, cords, and clutter.  Install safety equipment such as grab bars in bathrooms and safety rails on stairs.  Keep rooms and walkways well-lit. Activity   Follow a regular exercise program to stay fit. This will help you maintain your balance. Ask your health care provider what types of exercise are appropriate for you.  If you need a cane or walker, use it as recommended by your health care provider.  Wear supportive shoes that have nonskid soles. Lifestyle  Do not drink alcohol if your health care provider tells you not to drink.  If you drink alcohol, limit how much you have: ? 0-1 drink a day for women. ? 0-2 drinks a day for men.  Be aware of how much alcohol is in your drink. In the [place] one drink equals one typical bottle of beer (12 oz), one-half glass of wine (5 oz), or one shot of hard liquor (1 oz).  Do not use any products that contain nicotine or tobacco, such as cigarettes and e-cigarettes. If you need help quitting, ask your health care provider. Summary  Having a healthy lifestyle and getting preventive care can help to protect your health and wellness after age 68.  Screening and testing are the best way to find a health problem early and help you avoid having a fall. Early diagnosis and treatment give you the best chance for managing medical conditions that are more common for people who are older than age 48.  Falls are a major cause of broken bones and head injuries in people who are older than age 2. Take precautions to prevent a fall at home.  Work with your health care provider to learn what changes you can make to improve your health and wellness and to prevent  falls. This information is not intended to replace advice given to you by your health care provider. Make sure you discuss any questions you have with your health care provider. Document Released: 04/10/2017 Document Revised: 04/10/2017 Document Reviewed: 04/10/2017 Elsevier Interactive Patient Education  2019 [employer].

## 2018-08-01 NOTE — Telephone Encounter (Signed)
[email or web address]

## 2018-08-07 ENCOUNTER — Other Ambulatory Visit: Payer: Self-pay | Admitting: Obstetrics & Gynecology

## 2018-08-07 DIAGNOSIS — Z1231 Encounter for screening mammogram for malignant neoplasm of breast: Secondary | ICD-10-CM

## 2018-09-04 ENCOUNTER — Ambulatory Visit: Payer: Medicare Other

## 2018-10-13 ENCOUNTER — Ambulatory Visit: Payer: Medicare Other

## 2018-12-01 ENCOUNTER — Other Ambulatory Visit: Payer: Self-pay | Admitting: Cardiology

## 2018-12-01 NOTE — Telephone Encounter (Signed)
Pt last saw Dr Curt Bears 04/21/18, last labs 03/31/18 Creat 1.04, age 83, weight 76.2kg, CrCl 51.9, based on CrCl pt is on appropriate dosage of Xarelto 20mg  QD.  Will refill rx.

## 2018-12-02 ENCOUNTER — Ambulatory Visit
Admission: RE | Admit: 2018-12-02 | Discharge: 2018-12-02 | Disposition: A | Discharge status: Home / Self Care | Payer: Medicare Other | Source: Ambulatory Visit | Attending: Obstetrics & Gynecology | Admitting: Obstetrics & Gynecology

## 2018-12-02 ENCOUNTER — Other Ambulatory Visit: Payer: Self-pay

## 2018-12-02 DIAGNOSIS — Z1231 Encounter for screening mammogram for malignant neoplasm of breast: Secondary | ICD-10-CM

## 2018-12-16 ENCOUNTER — Other Ambulatory Visit: Payer: Self-pay | Admitting: Family Medicine

## 2018-12-16 DIAGNOSIS — R600 Localized edema: Secondary | ICD-10-CM

## 2019-02-11 ENCOUNTER — Other Ambulatory Visit: Payer: Self-pay | Admitting: Cardiology

## 2019-02-11 DIAGNOSIS — I480 Paroxysmal atrial fibrillation: Secondary | ICD-10-CM

## 2019-03-30 ENCOUNTER — Ambulatory Visit (INDEPENDENT_AMBULATORY_CARE_PROVIDER_SITE_OTHER): Payer: Medicare Other | Admitting: Orthopaedic Surgery

## 2019-03-30 ENCOUNTER — Encounter: Payer: Self-pay | Admitting: Orthopaedic Surgery

## 2019-03-30 ENCOUNTER — Ambulatory Visit: Payer: Self-pay

## 2019-03-30 ENCOUNTER — Other Ambulatory Visit: Payer: Self-pay

## 2019-03-30 VITALS — Ht 62.0 in | Wt 168.0 lb

## 2019-03-30 DIAGNOSIS — M170 Bilateral primary osteoarthritis of knee: Secondary | ICD-10-CM | POA: Diagnosis not present

## 2019-03-30 DIAGNOSIS — G8929 Other chronic pain: Secondary | ICD-10-CM | POA: Diagnosis not present

## 2019-03-30 DIAGNOSIS — M25561 Pain in right knee: Secondary | ICD-10-CM

## 2019-03-30 NOTE — Progress Notes (Signed)
Office Visit Note   Patient: Sheila Schmidt           Date of Birth: 11/12/1935           MRN: [id number] Visit Date: 03/30/2019              Requested by: Darreld Mclean, MD [address] [place],  [place] [address] PCP: Darreld Mclean, MD   Assessment & Plan: Visit Diagnoses:  1. Chronic pain of right knee   2. Bilateral primary osteoarthritis of knee     Plan: Osteoarthritis right knee.  Long discussion with Sheila Schmidt regarding the diagnosis.  She has had a prior left total knee replacement by Dr. Telford Nab many years ago. Discussed use of Tylenol.  Taking Xarelto so she cannot use and NSAIDs.  Has pullover knee support at home.  Prefers not to have cortisone or other injections.  She had some concerns about DVT.  She did not have any calf pain.  Office visit over 30 minutes discussing all the above 50% of the time in counseling Follow-Up Instructions: Return if symptoms worsen or fail to improve.   Orders:  Orders Placed This Encounter  Procedures  . XR KNEE 3 VIEW RIGHT   No orders of the defined types were placed in this encounter.     Procedures: No procedures performed   Clinical Data: No additional findings.   Subjective: Chief Complaint  Patient presents with  . Right Leg - Pain  Patient presents today with right leg pain X 3 weeks. No known injury.She said that the backside of her leg is "tight". She only notices this with going up stairs. She said that she cannot flex her right knee like she use to when going to the gym. She takes tylenol as needed.   HPI  Review of Systems   Objective: Vital Signs: Ht 5\' 2"  (1.575 m)   Wt 168 lb (76.2 kg)   BMI 30.73 kg/m   Physical Exam Constitutional:      Appearance: She is well-developed.  Eyes:     Pupils: Pupils are equal, round, and reactive to light.  Pulmonary:     Effort: Pulmonary effort is normal.  Skin:    General: Skin is warm and dry.  Neurological:     Mental  Status: She is alert and oriented to person, place, and time.  Psychiatric:        Behavior: Behavior normal.     Ortho Exam awake alert and oriented x3.  Walks without a limp.  Has a little bit of ankle swelling neurologically intact.  No calf pain.  Full extension of right knee without effusion but slight varus with weightbearing.  Had does have medial joint pain.  There is some patellar crepitation but no patella pain with compression.  No lateral joint pain.  Straight leg raise negative.  No pain range of motion of right hip Specialty Comments:  No specialty comments available.  Imaging: Xr Knee 3 View Right  Result Date: 03/30/2019 Films of the right knee obtained in 3 projections standing.  There is several degrees of varus with narrowing of the medial joint space.  Faint calcification within the menisci possibly consistent with CPPD.  Osteophytes in all 3 compartments with subchondral sclerosis and small subchondral cyst in the medial compartment.  Patellofemoral arthritis with prominence of the lateral patellofemoral joint.  Films are consistent with advanced osteoarthritis possible associated CPPD    PMFS History: Patient  Active Problem List   Diagnosis Date Noted  . Bilateral primary osteoarthritis of knee 03/30/2019  . Paroxysmal atrial fibrillation ([place]) 04/07/2015  . Chronic anticoagulation 04/07/2015  . Essential hypertension, benign 02/24/2014  . Sciatica 12/08/2013  . Hyperlipidemia 09/25/2013  . Osteopenia 11/19/2012  . BACK PAIN 01/26/2010  . Carcinoma in situ of breast 09/04/2009  . DEPRESSION, PROLONGED 09/04/2009  . DEGENERATIVE JOINT DISEASE, KNEES, BILATERAL 09/04/2009  . PERIPHERAL EDEMA 09/04/2009  . COLONIC POLYPS, ADENOMATOUS, HX OF 09/04/2009   Past Medical History:  Diagnosis Date  . Cancer ([place])     Left breast carcinoma in situ  . Cholelithiasis   . Depression   . 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 Hx of adenomatous  colonic polyps   . Hyperlipidemia     Family History  Problem Relation Age of Onset  . Diabetes Mother        Deceased in 68s  . Heart disease Mother        cad/MI-fatal  . Heart attack Mother   . Cancer Father        liver, Deceased in 21s  . Heart disease Sister   . Breast cancer Neg Hx   . Colon cancer Neg Hx     Past Surgical History:  Procedure Laterality Date  . BREAST EXCISIONAL BIOPSY Left 2010   benign  . CHOLECYSTECTOMY    . CHOLECYSTECTOMY, LAPAROSCOPIC  '06   [place]  . KNEE ARTHROSCOPY     Left '00 [facility]/ Right '08  . lumpectomy- remote     benign  . TOOTH EXTRACTION    . TOTAL KNEE ARTHROPLASTY  02/05/11   left   Social History   Occupational History  . 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 Smoking status: Never Smoker  . Smokeless tobacco: Never Used  Substance and Sexual Activity  . Alcohol use: Yes    Comment: 1 glass per day  . Drug use: No  . Sexual activity: Not on file

## 2019-04-03 ENCOUNTER — Other Ambulatory Visit: Payer: Self-pay | Admitting: Family Medicine

## 2019-04-03 DIAGNOSIS — E785 Hyperlipidemia, unspecified: Secondary | ICD-10-CM

## 2019-04-06 ENCOUNTER — Other Ambulatory Visit: Payer: Self-pay

## 2019-04-06 ENCOUNTER — Encounter: Payer: Self-pay | Admitting: Cardiology

## 2019-04-06 ENCOUNTER — Ambulatory Visit (INDEPENDENT_AMBULATORY_CARE_PROVIDER_SITE_OTHER): Payer: Medicare Other | Admitting: Cardiology

## 2019-04-06 VITALS — BP 126/66 | HR 57 | Ht 62.0 in | Wt 175.0 lb

## 2019-04-06 DIAGNOSIS — I4821 Permanent atrial fibrillation: Secondary | ICD-10-CM | POA: Diagnosis not present

## 2019-04-06 MED ORDER — FUROSEMIDE 20 MG PO TABS: 20.0000 mg | ORAL_TABLET | Freq: Every day | ORAL | 2 refills | Status: DC | PRN

## 2019-04-06 NOTE — Progress Notes (Signed)
Electrophysiology Office Note   Date:  04/06/2019   ID:  Sheila Schmidt, DOB 1935/11/27, MRN [id number]  PCP:  Sheila Mclean, MD  Cardiologist:  Sheila Schmidt Primary Electrophysiologist:  Sheila Schmidt Sheila Leeds, MD    No chief complaint on file.    History of Present Illness: Sheila Schmidt is a 83 y.o. female who presents today for electrophysiology evaluation.   History of atrial fibrillation, atrial flutter, HTN, HLD presenting for workup of atrial fibrillation. She has refused rhythm control in the past.   Today, denies symptoms of palpitations, chest pain, shortness of breath, orthopnea, PND, claudication, dizziness, presyncope, syncope, bleeding, or neurologic sequela. The patient is tolerating medications without difficulties.  He is overall feeling well.  She is working out at [employer].  She has noted that she has a little bit of increased lower extremity swelling.  She has been taking her fluid pill, but this has not been a great help.  Past Medical History:  Diagnosis Date  . Cancer ([place])     Left breast carcinoma in situ  . Cholelithiasis   . Depression   . 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 Hx of adenomatous colonic polyps   . Hyperlipidemia    Past Surgical History:  Procedure Laterality Date  . BREAST EXCISIONAL BIOPSY Left 2010   benign  . CHOLECYSTECTOMY    . CHOLECYSTECTOMY, LAPAROSCOPIC  '06   [place]  . KNEE ARTHROSCOPY     Left '00 [facility]/ Right '08  . lumpectomy- remote     benign  . TOOTH EXTRACTION    . TOTAL KNEE ARTHROPLASTY  02/05/11   left     Current Outpatient Medications  Medication Sig Dispense Refill  . atorvastatin (LIPITOR) 20 MG tablet TAKE 1 TABLET(20 MG) BY MOUTH DAILY 90 tablet 3  . Cholecalciferol 1000 UNITS tablet Take 1,000 Units by mouth daily.    Marland Kitchen diltiazem (CARDIZEM CD) 300 MG 24 hr capsule TAKE ONE CAPSULE BY MOUTH DAILY 90 capsule 0  . FLUoxetine (PROZAC) 10 MG capsule Take 10 mg by mouth every  other day.     Marland Kitchen OVER THE COUNTER MEDICATION at bedtime as needed. Sleep Aid    . triamterene-hydrochlorothiazide (MAXZIDE-25) 37.5-25 MG tablet TAKE 1/2 TO 1 TABLET BY MOUTH DAILY AS NEEDED FOR FOOT SWELLING 90 tablet 1  . XARELTO 20 MG TABS tablet TAKE 1 TABLET(20 MG) BY MOUTH DAILY WITH SUPPER 90 tablet 1   No current facility-administered medications for this visit.     Allergies:   Patient has no known allergies.   Social History:  The patient  reports that she has never smoked. She has never used smokeless tobacco. She reports current alcohol use. She reports that she does not use drugs.   Family History:  The patient's family history includes Cancer in her father; Diabetes in her mother; Heart attack in her mother; Heart disease in her mother and sister.   ROS:  Please see the history of present illness.   Otherwise, review of systems is positive for none.   All other systems are reviewed and negative.   PHYSICAL EXAM: VS:  BP 126/66   Pulse (!) 57   Ht 5\' 2"  (1.575 m)   Wt 175 lb (79.4 kg)   SpO2 96%   BMI 32.01 kg/m  , BMI Body mass index is 32.01 kg/m. GEN: Well nourished, well developed, in no acute distress  HEENT: normal  Neck: no JVD,  carotid bruits, or masses Cardiac: RRR; no murmurs, rubs, or gallops, 1+ edema  Respiratory:  clear to auscultation bilaterally, normal work of breathing GI: soft, nontender, nondistended, + BS MS: no deformity or atrophy  Skin: warm and dry Neuro:  Strength and sensation are intact Psych: euthymic mood, full affect  EKG:  EKG is ordered today. Personal review of the ekg ordered shows atrial flutter, septal and lateral infarct, rate 57  Recent Labs: No results found for requested labs within last 8760 hours.    Lipid Panel     Component Value Date/Time   CHOL 234 (H) 03/31/2018 1427   TRIG 89.0 03/31/2018 1427   HDL 58.20 03/31/2018 1427   CHOLHDL 4 03/31/2018 1427   VLDL 17.8 03/31/2018 1427   LDLCALC 158 (H) 03/31/2018  1427     Wt Readings from Last 3 Encounters:  04/06/19 175 lb (79.4 kg)  03/30/19 168 lb (76.2 kg)  07/31/18 168 lb (76.2 kg)      Other studies Reviewed: Additional studies/ records that were reviewed today include: TTE 2016, Myoview 2017, Holter 01/18/16  Review of the above records today demonstrates:  - Left ventricle: The cavity size was normal. Systolic function was   normal. The estimated ejection fraction was in the range of 50%   to 55%. Hypokinesis of the anteroseptal and inferoseptal   myocardium. The study is not technically sufficient to allow   evaluation of LV diastolic function. - Aortic valve: There was mild regurgitation. - Mitral valve: There was trivial regurgitation. - Left atrium: The atrium was moderately dilated. - Right ventricle: The cavity size was normal. Wall thickness was   normal. Systolic function was normal. - Pulmonic valve: There was mild regurgitation.   There was no ST segment deviation noted during stress.  This is a low risk study. Mild anteroseptal decreased radiotracer uptake seen at both rest and stress consistent with left bundle branch block artifact. No ischemia identified.  Atrial fibrillation-non-gated study, no ejection fraction calculated.   Atrial fibrillation / flutter noted with variable conduction  Mean heart rate 99 BPM  No pauses greater than 3 seconds  PVC's occasional (240) noted  ASSESSMENT AND PLAN:  1.  Permanent atrial fibrillation: On diltiazem and Xarelto.  No symptoms from her atrial fibrillation.  No changes at this time.  This patients CHA2DS2-VASc Score and unadjusted Ischemic Stroke Rate (% per year) is equal to 3.2 % stroke rate/year from a score of 3  Above score calculated as 1 point each if present [CHF, HTN, DM, Vascular=MI/PAD/Aortic Plaque, Age if 65-74, or Female] Above score calculated as 2 points each if present [Age > 75, or Stroke/TIA/TE]    2. Hyperlipidemia: He on Lipitor.  Plan per  primary physician.  3.  Lower extremity edema: 1+ on exam today.  She has Maxide, but we Sheila Schmidt plan to stop that and start her on Lasix.  These can be taking on an as-needed basis.  Current medicines are reviewed at length with the patient today.   The patient does not have concerns regarding her medicines.  The following changes were made today: Add Lasix  Labs/ tests ordered today include:  Orders Placed This Encounter  Procedures  . EKG 12-Lead     Disposition:   FU with Chrysa Rampy 12 months  Signed, Dannia Snook Sheila Leeds, MD  04/06/2019 2:33 PM     [facility] [address] [place] [address] [phone] (office) [phone] (fax)

## 2019-04-06 NOTE — Patient Instructions (Addendum)
Medication Instructions:  Your physician has recommended you make the following change in your medication:  1.  STOP Maxide 2. START Lasix 20 mEq once daily AS NEEDED for swelling  - take this medication once daily for the next 3 days, then take as needed  * If you need a refill on your cardiac medications before your next appointment, please call your pharmacy.   Labwork: None ordered  Testing/Procedures: None ordered  Follow-Up: Your physician wants you to follow-up in: 1 year with Dr. Curt Bears.  You will receive a reminder letter in the mail two months in advance. If you don't receive a letter, please call our office to schedule the follow-up appointment.   Thank you for choosing [facility]!!   Trinidad Curet, RN [phone]  Any Other Special Instructions Will Be Listed Below (If Applicable). Please obtain BMET & CBC when you go see your primary doctor.  Handwritten prescription given to you today.

## 2019-04-06 NOTE — Addendum Note (Signed)
Addended by: Stanton Kidney on: 04/06/2019 02:46 PM   Modules accepted: Orders

## 2019-04-15 NOTE — Telephone Encounter (Signed)
Left message informing pt I would try and reach her Friday to discuss recommendation

## 2019-04-17 NOTE — Telephone Encounter (Signed)
Pt made aware to take Lasix 40 mg x 3 days.  Advised to let me know if continued swelling after taking for 3 additional days. Patient verbalized understanding and agreeable to plan.

## 2019-05-05 MED ORDER — TORSEMIDE 10 MG PO TABS: 10.0000 mg | ORAL_TABLET | Freq: Every day | ORAL | 0 refills | Status: DC | PRN

## 2019-05-05 NOTE — Telephone Encounter (Signed)
Advised pt to stop Furosemide for now.  Start Torsemide 10 mg daily PRN for swelling.  Advised to call office if medication change still doesn't improve fluid/swelling. Patient verbalized understanding and agreeable to plan.

## 2019-05-16 ENCOUNTER — Other Ambulatory Visit: Payer: Self-pay | Admitting: Cardiology

## 2019-05-16 DIAGNOSIS — I480 Paroxysmal atrial fibrillation: Secondary | ICD-10-CM

## 2019-05-18 ENCOUNTER — Other Ambulatory Visit: Payer: Self-pay | Admitting: Cardiology

## 2019-06-03 ENCOUNTER — Other Ambulatory Visit: Payer: Self-pay | Admitting: Cardiology

## 2019-06-03 DIAGNOSIS — I480 Paroxysmal atrial fibrillation: Secondary | ICD-10-CM

## 2019-06-03 NOTE — Telephone Encounter (Addendum)
Prescription refill request for Xarelto received.   Last office visit:04/06/2019, [place] Weight: 79.4 kg, 04/06/2019 Age: 83 y.o. Scr: 1.04, 03/31/2018 CrCl: 53 ml/min  Pt is overdue for labs

## 2019-06-03 NOTE — Telephone Encounter (Signed)
Pt called back and was able to schedule an appointment to get labs. Will refill Xarelto and give her a 1 month supply.

## 2019-06-03 NOTE — Telephone Encounter (Signed)
Called pt and LMOM.  

## 2019-06-22 ENCOUNTER — Encounter: Payer: Self-pay | Admitting: Family Medicine

## 2019-06-24 ENCOUNTER — Other Ambulatory Visit: Payer: Medicare Other

## 2019-07-06 ENCOUNTER — Other Ambulatory Visit: Payer: Self-pay | Admitting: Cardiology

## 2019-07-06 NOTE — Telephone Encounter (Signed)
Prescription refill request for Xarelto received.   Last office visit: 04/06/2019, [place] Weight: 79.4 kg Age: 84 y.o.  Scr: 1.04, 03/31/2018 CrCl: 53 ml/min   Called and spoke to pt. She stated that was completely out of Xarelto. Informed her that I would refill prescription and give her a 1 month supply. Pt is overdue for blood work informed her it is important for her to have updated blood work to make sure she is on the correct dose. She stated that she had a written prescription from Dr. Curt Bears for her to get a CBC and Bmet. She stated that she would go as soon as possible to get blood work.

## 2019-07-07 ENCOUNTER — Telehealth: Payer: Self-pay

## 2019-07-07 NOTE — Telephone Encounter (Signed)
Spoke to husband (DPR on file), wife not at home.  Discussed why the cost is so high.  Explained that depending on income they may qualify for reduced cost through the maker of [place]. Samples left downstairs (at [facility] office), along w/ assistance paperwork. Husband will inform pt and have her stop by to pick up samples. He appreciates our help.

## 2019-07-07 NOTE — Telephone Encounter (Signed)
**Note De-Identified Sheila Schmidt Obfuscation** Due to [facility] message from the pt (sent yesterday 1/25) concerning the cost of her Xarelto I have done a Xarelto tier exception through covermymeds in hopes of lowering Name Brand Xarelto to a lower tier which will reduce the pts deductible cost as well if approved. Key: [id number]

## 2019-07-08 NOTE — Telephone Encounter (Signed)
**Note De-Identified Elan Mcelvain Obfuscation** Letter received Sheila Schmidt fax from [employer] stating that they cancelled the pts "Prior Auth" for Xarelto as it is covered by the pts plan. I did a Xarelto Tier Exception through covermymeds, not a PA.  I called [employer] and s/w Lisabeth Devoid who stated that we can change the PA request (?) to a tier exception. She states that the form they received came to them as a PA request.  She started a new Xarelto tier exception and I answered all questions required on this form.  Per Lisabeth Devoid there is a 2 to 5 day turn around for this request and that they will notify us of their decision by fax.  New [id number]

## 2019-07-09 NOTE — Telephone Encounter (Signed)
**Note De-Identified Sheila Schmidt Obfuscation** Letter received from [employer] stating that they have denied the pts Xarelto tier exception. Reason:  Xarelto is already at a tier 2 and cannot be lowered further as a name brand name medication per Medicare part D rules. [id number]

## 2019-07-16 ENCOUNTER — Encounter: Payer: Self-pay | Admitting: Family Medicine

## 2019-07-21 ENCOUNTER — Other Ambulatory Visit: Payer: Medicare Other

## 2019-07-21 ENCOUNTER — Other Ambulatory Visit: Payer: Self-pay

## 2019-07-21 DIAGNOSIS — I480 Paroxysmal atrial fibrillation: Secondary | ICD-10-CM

## 2019-07-22 LAB — CBC
Hematocrit: 40.8 % (ref 34.0–46.6)
Hemoglobin: 14.3 g/dL (ref 11.1–15.9)
MCH: 34.1 pg — ABNORMAL HIGH (ref 26.6–33.0)
MCHC: 35 g/dL (ref 31.5–35.7)
MCV: 97 fL (ref 79–97)
Platelets: 276 10*3/uL (ref 150–450)
RBC: 4.19 x10E6/uL (ref 3.77–5.28)
RDW: 11.7 % (ref 11.7–15.4)
WBC: 12.1 10*3/uL — ABNORMAL HIGH (ref 3.4–10.8)

## 2019-07-22 LAB — BASIC METABOLIC PANEL
BUN/Creatinine Ratio: 14 (ref 12–28)
BUN: 12 mg/dL (ref 8–27)
CO2: 26 mmol/L (ref 20–29)
Calcium: 9.9 mg/dL (ref 8.7–10.3)
Chloride: 98 mmol/L (ref 96–106)
Creatinine, Ser: 0.84 mg/dL (ref 0.57–1.00)
GFR calc Af Amer: 75 mL/min/{1.73_m2} (ref >59)
GFR calc non Af Amer: 65 mL/min/{1.73_m2} (ref >59)
Glucose: 86 mg/dL (ref 65–99)
Potassium: 3.9 mmol/L (ref 3.5–5.2)
Sodium: 139 mmol/L (ref 134–144)

## 2019-07-29 ENCOUNTER — Other Ambulatory Visit: Payer: Self-pay

## 2019-07-29 MED ORDER — RIVAROXABAN 20 MG PO TABS: ORAL_TABLET | ORAL | 1 refills | Status: DC

## 2019-07-29 NOTE — Telephone Encounter (Signed)
Pt last saw Dr Curt Bears 04/06/19, last labs 07/21/19 Creat 0.84, age 84, weight 79.4kg, CrCl 65.84, based on CrCl pt is on appropriate dosage of Xarelto 20mg  QD.  Will refill rx.

## 2019-08-04 ENCOUNTER — Ambulatory Visit: Payer: Medicare Other | Admitting: *Deleted

## 2019-08-14 ENCOUNTER — Other Ambulatory Visit: Payer: Self-pay | Admitting: Cardiology

## 2019-08-14 DIAGNOSIS — I480 Paroxysmal atrial fibrillation: Secondary | ICD-10-CM

## 2019-08-19 ENCOUNTER — Encounter: Payer: Self-pay | Admitting: Family Medicine

## 2019-10-13 NOTE — Patient Instructions (Addendum)
It was great to see you again today Your ultrasound is scheduled at 10:30 tomorrow [facility]- if this does not work for you please stop by imaging on your way out and re-schedule.   Ok to use tylenol as needed for pain!

## 2019-10-13 NOTE — Progress Notes (Signed)
[place] at [facility] [address] [place], [place] [address] [phone]  Date:  10/14/2019   Name:  Sheila Schmidt   DOB:  10/01/1935   MRN:  [id number]  PCP:  Sheila Mclean, MD    Chief Complaint: Leg Pain (right leg, pulled muscle)   History of Present Illness:  Sheila Schmidt is a 84 y.o. very pleasant female patient who presents with the following:  Patient with history of hypertension, atrial fibrillation, hyperlipidemia, breast cancer.  I last saw her about 1 year ago Here today with concern of possible earwax, and a leg injury She saw Dr. Curt Schmidt for follow-up in the fall, he made an adjustment to her diuretic regimen  She is taking diltiazem and Xarelto, also Lipitor  COVID-19 vaccine; done  Colonoscopy 2009?  Follow-up BMP, CBC done in February.  She could use a lipid profile-patient declines today, would like to do at a later date  She was walking on her treadmill and felt a pull in her right leg.  This occurred about 6 weeks ago and is still bothering her.  She feels like the pain is in the posterior of her leg, from the thigh to the calf Her left knee has been replaced but not the right as of yet  She may get some pain at night, or with walking  Unfortunately her husband has what sounds like oropharyngeal squamous cell carcinoma-he is undergone surgery, patient reports he is currently 2-week for chemo but is doing radiation.  It sounds like he has a feeding tube.  The patient has put her own health on the back burner while her husband has been so ill Patient Active Problem List   Diagnosis Date Noted  . Bilateral primary osteoarthritis of knee 03/30/2019  . Paroxysmal atrial fibrillation ([place]) 04/07/2015  . Chronic anticoagulation 04/07/2015  . Essential hypertension, benign 02/24/2014  . Sciatica 12/08/2013  . Hyperlipidemia 09/25/2013  . Osteopenia 11/19/2012  . BACK PAIN 01/26/2010  . Carcinoma in  situ of breast 09/04/2009  . DEPRESSION, PROLONGED 09/04/2009  . DEGENERATIVE JOINT DISEASE, KNEES, BILATERAL 09/04/2009  . PERIPHERAL EDEMA 09/04/2009  . COLONIC POLYPS, ADENOMATOUS, HX OF 09/04/2009    Past Medical History:  Diagnosis Date  . Cancer ([place])     Left breast carcinoma in situ  . Cholelithiasis   . Depression   . 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 Hx of adenomatous colonic polyps   . Hyperlipidemia     Past Surgical History:  Procedure Laterality Date  . BREAST EXCISIONAL BIOPSY Left 2010   benign  . CHOLECYSTECTOMY    . CHOLECYSTECTOMY, LAPAROSCOPIC  '06   [place]  . KNEE ARTHROSCOPY     Left '00 [place]/ Right '08  . lumpectomy- remote     benign  . TOOTH EXTRACTION    . TOTAL KNEE ARTHROPLASTY  02/05/11   left    Social History   Tobacco Use  . Smoking status: Never Smoker  . Smokeless tobacco: Never Used  Substance Use Topics  . Alcohol use: Yes    Comment: 1 glass per day  . Drug use: No    Family History  Problem Relation Age of Onset  . Diabetes Mother        Deceased in 57s  . Heart disease Mother        cad/MI-fatal  . Heart attack Mother   . Cancer  Father        liver, Deceased in 49s  . Heart disease Sister   . Breast cancer Neg Hx   . Colon cancer Neg Hx     No Known Allergies  Medication list has been reviewed and updated.  Current Outpatient Medications on File Prior to Visit  Medication Sig Dispense Refill  . atorvastatin (LIPITOR) 20 MG tablet TAKE 1 TABLET(20 MG) BY MOUTH DAILY 90 tablet 3  . Cholecalciferol 1000 UNITS tablet Take 1,000 Units by mouth daily.    Marland Kitchen diltiazem (CARDIZEM CD) 300 MG 24 hr capsule TAKE 1 CAPSULE BY MOUTH DAILY 90 capsule 1  . FLUoxetine (PROZAC) 10 MG capsule Take 10 mg by mouth every other day.     . furosemide (LASIX) 20 MG tablet Take 1 tablet (20 mg total) by mouth daily as needed (for swelling). 30 tablet 2  . OVER THE COUNTER MEDICATION at bedtime as needed. Sleep  Aid    . rivaroxaban (XARELTO) 20 MG TABS tablet TAKE 1 TABLET(20 MG) BY MOUTH DAILY WITH SUPPER 90 tablet 1  . torsemide (DEMADEX) 10 MG tablet TAKE 1 TABLET(10 MG) BY MOUTH DAILY AS NEEDED FOR SWELLING 90 tablet 3   No current facility-administered medications on file prior to visit.    Review of Systems:  As per HPI- otherwise negative.   Physical Examination: Vitals:   10/14/19 1428  BP: 132/80  Pulse: 72  Resp: 16  Temp: (!) 97.5 F (36.4 C)  SpO2: 97%   Vitals:   10/14/19 1428  Weight: 164 lb (74.4 kg)  Height: 5\' 2"  (1.575 m)   Body mass index is 30 kg/m. Ideal Body Weight: Weight in (lb) to have BMI = 25: 136.4  GEN: no acute distress.  Overweight, looks well HEENT: Atraumatic, Normocephalic.   Cerumen present in both ear canals Ears and Nose: No external deformity. CV: RRR, No M/G/R. No JVD. No thrill. No extra heart sounds. PULM: CTA B, no wheezes, crackles, rhonchi. No retractions. No resp. distress. No accessory muscle use. ABD: S, NT, ND, +BS. No rebound. No HSM. EXTR: No c/c/e PSYCH: Normally interactive. Conversant.  Her right leg is benign to exam.  I am not able to reproduce tenderness.  She does have some crepitus at the right knee.  No calf swelling or tenderness is present.  The patient feels like her pain may be most intense behind the right knee Right hip is normal  Does ears are irrigated, cerumen is removed.  TMs within normal limits bilaterally Assessment and Plan: Right leg pain - Plan: US Venous Img Lower Unilateral Right  Cerumen in auditory canal on examination  Cerumen impaction resolved today Patient has noticed nonspecific posterior right leg pain for about 6 weeks.  Seem to start after she worked out on the treadmill.  Suspicious for Baker's cyst, will obtain ultrasound to look for Baker's cyst and also rule out DVT at the same time.  She is on Xarelto, so DVT is less likely She has not really been using any medication for discomfort,  I advised her that Tylenol would be a safe option for her We will plan follow-up pending her ultrasound results Moderate medical decision making today  This visit occurred during the SARS-CoV-2 public health emergency.  Safety protocols were in place, including screening questions prior to the visit, additional usage of staff PPE, and extensive cleaning of exam room while observing appropriate contact time as indicated for disinfecting solutions.    Signed Janett Billow  Keeshawn Fakhouri, MD

## 2019-10-14 ENCOUNTER — Encounter: Payer: Self-pay | Admitting: Family Medicine

## 2019-10-14 ENCOUNTER — Other Ambulatory Visit: Payer: Self-pay

## 2019-10-14 ENCOUNTER — Ambulatory Visit: Payer: Medicare Other | Admitting: Family Medicine

## 2019-10-14 VITALS — BP 132/80 | HR 72 | Temp 97.5°F | Resp 16 | Ht 62.0 in | Wt 164.0 lb

## 2019-10-14 DIAGNOSIS — M79604 Pain in right leg: Secondary | ICD-10-CM

## 2019-10-14 DIAGNOSIS — H6120 Impacted cerumen, unspecified ear: Secondary | ICD-10-CM

## 2019-10-15 ENCOUNTER — Ambulatory Visit (HOSPITAL_BASED_OUTPATIENT_CLINIC_OR_DEPARTMENT_OTHER): Payer: Medicare Other

## 2019-10-16 ENCOUNTER — Ambulatory Visit (HOSPITAL_BASED_OUTPATIENT_CLINIC_OR_DEPARTMENT_OTHER): Admission: RE | Admit: 2019-10-16 | Payer: Medicare Other | Source: Ambulatory Visit

## 2019-10-20 ENCOUNTER — Other Ambulatory Visit: Payer: Self-pay

## 2019-10-20 ENCOUNTER — Ambulatory Visit (HOSPITAL_BASED_OUTPATIENT_CLINIC_OR_DEPARTMENT_OTHER)
Admission: RE | Admit: 2019-10-20 | Discharge: 2019-10-20 | Disposition: A | Discharge status: Home / Self Care | Payer: Medicare Other | Source: Ambulatory Visit | Attending: Family Medicine | Admitting: Family Medicine

## 2019-10-20 DIAGNOSIS — M79604 Pain in right leg: Secondary | ICD-10-CM | POA: Diagnosis present

## 2019-10-21 ENCOUNTER — Encounter: Payer: Self-pay | Admitting: Family Medicine

## 2019-12-04 ENCOUNTER — Telehealth: Payer: Self-pay

## 2019-12-31 ENCOUNTER — Other Ambulatory Visit: Payer: Self-pay | Admitting: Obstetrics & Gynecology

## 2019-12-31 DIAGNOSIS — Z1231 Encounter for screening mammogram for malignant neoplasm of breast: Secondary | ICD-10-CM

## 2020-01-05 ENCOUNTER — Ambulatory Visit
Admission: RE | Admit: 2020-01-05 | Discharge: 2020-01-05 | Disposition: A | Discharge status: Home / Self Care | Payer: Medicare Other | Source: Ambulatory Visit | Attending: Obstetrics & Gynecology | Admitting: Obstetrics & Gynecology

## 2020-01-05 ENCOUNTER — Other Ambulatory Visit: Payer: Self-pay

## 2020-01-05 DIAGNOSIS — Z1231 Encounter for screening mammogram for malignant neoplasm of breast: Secondary | ICD-10-CM

## 2020-01-07 ENCOUNTER — Other Ambulatory Visit: Payer: Self-pay

## 2020-01-07 ENCOUNTER — Encounter: Payer: Self-pay | Admitting: Orthopaedic Surgery

## 2020-01-07 ENCOUNTER — Ambulatory Visit (INDEPENDENT_AMBULATORY_CARE_PROVIDER_SITE_OTHER): Payer: Medicare Other | Admitting: Orthopaedic Surgery

## 2020-01-07 ENCOUNTER — Ambulatory Visit: Payer: Self-pay

## 2020-01-07 VITALS — Ht 62.0 in | Wt 166.6 lb

## 2020-01-07 DIAGNOSIS — M170 Bilateral primary osteoarthritis of knee: Secondary | ICD-10-CM | POA: Diagnosis not present

## 2020-01-07 DIAGNOSIS — M5441 Lumbago with sciatica, right side: Secondary | ICD-10-CM | POA: Diagnosis not present

## 2020-01-07 DIAGNOSIS — M545 Low back pain, unspecified: Secondary | ICD-10-CM

## 2020-01-07 DIAGNOSIS — G8929 Other chronic pain: Secondary | ICD-10-CM

## 2020-01-07 DIAGNOSIS — M5442 Lumbago with sciatica, left side: Secondary | ICD-10-CM | POA: Diagnosis not present

## 2020-01-07 NOTE — Progress Notes (Signed)
Office Visit Note   Patient: Sheila Schmidt           Date of Birth: 09/27/1935           MRN: [id number] Visit Date: 01/07/2020              Requested by: Darreld Mclean, MD [address] [place],  [place] [address] PCP: Darreld Mclean, MD   Assessment & Plan: Visit Diagnoses:  1. Low back pain, unspecified back pain laterality, unspecified chronicity, unspecified whether sciatica present   2. Bilateral primary osteoarthritis of knee   3. Chronic bilateral low back pain with bilateral sciatica     Plan: Sheila Schmidt has end-stage osteoarthritis of her right knee and is considering total knee replacement.  She had a prior left total knee replacement by Dr. Telford Nab many years ago and has been happy with it.  In addition she has been experiencing discomfort in her back buttock and both legs particularly on the right when she stands for a length of time or ambulates any distance.  I think there is a reasonable chance that she has some spinal stenosis.  I think it is worth obtaining an MRI scan and looking for other causes of leg pain before we proceed with a knee replacement.  Long discussion and any questions were answered.  Return after the MRI scan  Follow-Up Instructions: Return After MRI scan lumbar spine.   Orders:  Orders Placed This Encounter  Procedures  . XR Lumbar Spine 2-3 Views   No orders of the defined types were placed in this encounter.     Procedures: No procedures performed   Clinical Data: No additional findings.   Subjective: Chief Complaint  Patient presents with  . Right Knee - Pain  Mrs. [name] is accompanied by her daughter and here for evaluation of right lower extremity pain.  I last saw her in February with a diagnosis of end-stage osteoarthritis of her right knee.  She had prior left total knee replacement by Dr. Tommie Raymond with a good result.  In the interim her husband became quite ill and passed away about 6 weeks ago.   She like to discuss knee replacement.  She also relates having posterior right thigh and calf discomfort but tickly when she is standing for any length of time or walking for any distance.  She has not had any bowel or bladder dysfunction.  She does have history of "heart problems" and is on Xarelto.  She is also on a "fluid pill" for chronic ankle swelling.  She has had a history of atrial fibrillation. Films of her right knee were obtained in October of last year.  These demonstrate end-stage osteoarthritis particularly in the medial compartment where there is near bone-on-bone and about 2 or 3 degrees of varus  HPI  Review of Systems   Objective: Vital Signs: Ht 5\' 2"  (1.575 m)   Wt 166 lb 9.6 oz (75.6 kg)   BMI 30.47 kg/m   Physical Exam Constitutional:      Appearance: She is well-developed.  Eyes:     Pupils: Pupils are equal, round, and reactive to light.  Pulmonary:     Effort: Pulmonary effort is normal.  Skin:    General: Skin is warm and dry.  Neurological:     Mental Status: She is alert and oriented to person, place, and time.  Psychiatric:        Behavior: Behavior normal.  Ortho Exam awake alert and oriented x3.  Comfortable sitting.  Does have mild pitting edema of both ankles.  Pes planus bilaterally.  +1 pulses.  Feet were warm.  No calf pain.  Does have some medial lateral joint pain right knee.  No effusion.  Full extension and over 120 degrees of flexion without instability.  Motor exam appears to be intact.  Painless range of motion both hips  Specialty Comments:  No specialty comments available.  Imaging: No results found.   PMFS History: Patient Active Problem List   Diagnosis Date Noted  . Bilateral primary osteoarthritis of knee 03/30/2019  . Paroxysmal atrial fibrillation ([place]) 04/07/2015  . Chronic anticoagulation 04/07/2015  . Essential hypertension, benign 02/24/2014  . Sciatica 12/08/2013  . Hyperlipidemia 09/25/2013  . Osteopenia  11/19/2012  . Low back pain 01/26/2010  . Carcinoma in situ of breast 09/04/2009  . DEPRESSION, PROLONGED 09/04/2009  . DEGENERATIVE JOINT DISEASE, KNEES, BILATERAL 09/04/2009  . PERIPHERAL EDEMA 09/04/2009  . COLONIC POLYPS, ADENOMATOUS, HX OF 09/04/2009   Past Medical History:  Diagnosis Date  . Cancer ([place])     Left breast carcinoma in situ  . Cholelithiasis   . Depression   . 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 Hx of adenomatous colonic polyps   . Hyperlipidemia     Family History  Problem Relation Age of Onset  . Diabetes Mother        Deceased in 32s  . Heart disease Mother        cad/MI-fatal  . Heart attack Mother   . Cancer Father        liver, Deceased in 25s  . Heart disease Sister   . Breast cancer Neg Hx   . Colon cancer Neg Hx     Past Surgical History:  Procedure Laterality Date  . BREAST EXCISIONAL BIOPSY Left 2010   benign  . CHOLECYSTECTOMY    . CHOLECYSTECTOMY, LAPAROSCOPIC  '06   [place]  . KNEE ARTHROSCOPY     Left '00 [place]/ Right '08  . lumpectomy- remote     benign  . TOOTH EXTRACTION    . TOTAL KNEE ARTHROPLASTY  02/05/11   left   Social History   Occupational History  . 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 Smoking status: Never Smoker  . Smokeless tobacco: Never Used  Vaping Use  . Vaping Use: Never used  Substance and Sexual Activity  . Alcohol use: Yes    Comment: 1 glass per day  . Drug use: No  . Sexual activity: Not on file     Garald Balding, MD   Note - This record has been created using [employer].  Chart creation errors have been sought, but may not always  have been located. Such creation errors do not reflect on  the standard of medical care.

## 2020-01-13 ENCOUNTER — Encounter: Payer: Self-pay | Admitting: Family Medicine

## 2020-01-23 ENCOUNTER — Other Ambulatory Visit: Payer: Self-pay | Admitting: Cardiology

## 2020-01-25 NOTE — Telephone Encounter (Signed)
Last OV 04/06/2019 Scr 0.84 ABW crcl 62

## 2020-02-15 ENCOUNTER — Other Ambulatory Visit: Payer: Self-pay | Admitting: Cardiology

## 2020-02-15 DIAGNOSIS — I480 Paroxysmal atrial fibrillation: Secondary | ICD-10-CM

## 2020-02-17 NOTE — Telephone Encounter (Signed)
This is a [facility] pt 

## 2020-02-29 ENCOUNTER — Other Ambulatory Visit: Payer: Self-pay

## 2020-02-29 ENCOUNTER — Telehealth (INDEPENDENT_AMBULATORY_CARE_PROVIDER_SITE_OTHER): Payer: Medicare Other | Admitting: Family Medicine

## 2020-02-29 DIAGNOSIS — Z09 Encounter for follow-up examination after completed treatment for conditions other than malignant neoplasm: Secondary | ICD-10-CM

## 2020-02-29 DIAGNOSIS — I480 Paroxysmal atrial fibrillation: Secondary | ICD-10-CM | POA: Diagnosis not present

## 2020-02-29 DIAGNOSIS — E785 Hyperlipidemia, unspecified: Secondary | ICD-10-CM | POA: Diagnosis not present

## 2020-02-29 DIAGNOSIS — I10 Essential (primary) hypertension: Secondary | ICD-10-CM

## 2020-02-29 MED ORDER — METOPROLOL TARTRATE 25 MG PO TABS: 25.0000 mg | ORAL_TABLET | Freq: Two times a day (BID) | ORAL | 0 refills | Status: DC

## 2020-02-29 NOTE — Progress Notes (Signed)
Sheila Schmidt at [facility] [address], [place], [place] [address] [phone]  Date:  02/29/2020   Name:  Sheila Schmidt   DOB:  1935-07-19   MRN:  [id number]  PCP:  Darreld Mclean, MD    Chief Complaint: No chief complaint on file.   History of Present Illness:  Sheila Schmidt is a 84 y.o. very pleasant female patient who presents with the following:  This is one of my primary patients, virtual visit today for hospital follow-up Patient location is home, provider location is home.  Patient identity confirmed with 2 factors, she gives consent for virtual visit today.  The patien,t myself, as well as her daughter are on the call today Last seen by myself in May of this year  She has history of A. fib on anticoagulation with Xarelto, hypertension, hyperlipidemia, breast cancer  Pt was in the hospital for 5 days recently with a fever- she was dx with pneumonia,  UTI, tachycardia She was visiting her daughter in [place] and was noted to have a high fever.  They took her to the ER she ended up being admitted  She had tachycardia and HTN due to her infection She was inpt in [facility] will request these records Admitted on 8/5 and stayed for about 5 days per the recollection She was treated by IM and cardiology while inpt  She is now feeling generally very well-better than she has in a while Her local cardiologist is Dr Curt Bears She may be transferring her cardiology care to [facility]- she really liked the doctor who she met there, she is often in [place] as her 2 daughters live there  No further fever since she got home   Her husband died over the summer- her older daughter is with her right now at her home in [place]  Patient Active Problem List   Diagnosis Date Noted  . Bilateral primary osteoarthritis of knee 03/30/2019  . Paroxysmal atrial fibrillation ([place]) 04/07/2015  . Chronic  anticoagulation 04/07/2015  . Essential hypertension, benign 02/24/2014  . Sciatica 12/08/2013  . Hyperlipidemia 09/25/2013  . Osteopenia 11/19/2012  . Low back pain 01/26/2010  . Carcinoma in situ of breast 09/04/2009  . DEPRESSION, PROLONGED 09/04/2009  . DEGENERATIVE JOINT DISEASE, KNEES, BILATERAL 09/04/2009  . PERIPHERAL EDEMA 09/04/2009  . COLONIC POLYPS, ADENOMATOUS, HX OF 09/04/2009    Past Medical History:  Diagnosis Date  . Cancer ([place])     Left breast carcinoma in situ  . Cholelithiasis   . Depression   . 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 Hx of adenomatous colonic polyps   . Hyperlipidemia     Past Surgical History:  Procedure Laterality Date  . BREAST EXCISIONAL BIOPSY Left 2010   benign  . CHOLECYSTECTOMY    . CHOLECYSTECTOMY, LAPAROSCOPIC  '06   [place]  . KNEE ARTHROSCOPY     Left '00 [place]/ Right '08  . lumpectomy- remote     benign  . TOOTH EXTRACTION    . TOTAL KNEE ARTHROPLASTY  02/05/11   left    Social History   Tobacco Use  . Smoking status: Never Smoker  . Smokeless tobacco: Never Used  Vaping Use  . Vaping Use: Never used  Substance Use Topics  . Alcohol use: Yes    Comment: 1 glass per day  . Drug use: No  Family History  Problem Relation Age of Onset  . Diabetes Mother        Deceased in 20s  . Heart disease Mother        cad/MI-fatal  . Heart attack Mother   . Cancer Father        liver, Deceased in 13s  . Heart disease Sister   . Breast cancer Neg Hx   . Colon cancer Neg Hx     No Known Allergies  Medication list has been reviewed and updated.  Current Outpatient Medications on File Prior to Visit  Medication Sig Dispense Refill  . atorvastatin (LIPITOR) 20 MG tablet TAKE 1 TABLET(20 MG) BY MOUTH DAILY 90 tablet 3  . Cholecalciferol 1000 UNITS tablet Take 1,000 Units by mouth daily.    Marland Kitchen diltiazem (CARDIZEM CD) 300 MG 24 hr capsule Take 1 capsule (300 mg total) by mouth daily. Please make  yearly appt with Dr. Curt Bears for October for future refills. 1st attempt 90 capsule 0  . FLUoxetine (PROZAC) 10 MG capsule Take 10 mg by mouth every other day.     . furosemide (LASIX) 20 MG tablet Take 1 tablet (20 mg total) by mouth daily as needed (for swelling). 30 tablet 2  . OVER THE COUNTER MEDICATION at bedtime as needed. Sleep Aid    . torsemide (DEMADEX) 10 MG tablet TAKE 1 TABLET(10 MG) BY MOUTH DAILY AS NEEDED FOR SWELLING 90 tablet 3  . XARELTO 20 MG TABS tablet TAKE 1 TABLET(20 MG) BY MOUTH DAILY WITH SUPPER 90 tablet 1   No current facility-administered medications on file prior to visit.    Review of Systems:  As per HPI- otherwise negative.   Physical Examination: There were no vitals filed for this visit. There were no vitals filed for this visit. There is no height or weight on file to calculate BMI. Ideal Body Weight:    Observed pt on video monitor  She looks well, her normal self Accompanied by her daughter They are not checking vitals at home right now  Assessment and Plan: Dyslipidemia  Paroxysmal atrial fibrillation (HCC)  Essential hypertension, benign - Plan: metoprolol tartrate (LOPRESSOR) 25 MG tablet  Virtual visit today to follow-up on recent hospital admission Patient was admitted for 5 days in [place] with what sounds like pneumonia and UTI.  While she was admitted, her blood pressure medications were adjusted somewhat, and she is currently feeling very well I made her an appointment to see me next week to give her flu shot and do routine labs.  I have requested her hospital discharge records and hope to have these by that time She is asked to let me know if any problems or concerns in the meantime  Signed Lamar Blinks, MD

## 2020-03-02 ENCOUNTER — Encounter: Payer: Self-pay | Admitting: Family Medicine

## 2020-03-06 NOTE — Progress Notes (Addendum)
[place] at [facility] [address], [place], [place] [address] [phone]  Date:  03/09/2020   Name:  Sheila Schmidt   DOB:  [birth year]   MRN:  [id number]  PCP:  Darreld Mclean, MD    Chief Complaint: Routine Lab Work and Flu Vaccine   History of Present Illness:  Sheila Schmidt is a 84 y.o. very pleasant female patient who presents with the following:  Patient today for follow-up visit-flu shot and routine labs Last seen by myself virtually last week for hospital follow-up; she was visiting her daughter in [place], became ill with fever.  She was admitted and treated for pneumonia and UTI We requested and received her hospital records, they have been scanned to chart   Her blood pressure medications were adjusted while in the hospital; DC dilt, decreased toprol dose.  She feels like she is doing well on this regiemn She lost her husband of many years over the summer- emotionally she is working on this adjustment  She has 2 adult daughters in the [place] area and one in [place] She is considering moving to [facility]- she has a follow-up with the cardiologist she saw during her recent admit in about one month.  They plan to try cardioversion  History of A fib for 4-5 years- rate control and anticoag She has not been cardioverted in the past  Weight is stable, she is not on any diuretic at this time  She rode her exercise bike at the gym yesterday for 45 minutes She does do some treadmill but it is hard with her knee   Her urinary sx are now resolved  She had a colonoscopy maybe 12 years ago- she was given 10 year follow-up No history of colon cancer We discussed if she wants to continue screening- she is not sure, will think about it   Wt Readings from Last 3 Encounters:  03/09/20 163 lb (73.9 kg)  01/07/20 166 lb 9.6 oz (75.6 kg)  10/14/19 164 lb (74.4 kg)     Pulse Readings from Last 3  Encounters:  03/09/20 85  10/14/19 72  04/06/19 (!) 57    Patient Active Problem List   Diagnosis Date Noted  . Bilateral primary osteoarthritis of knee 03/30/2019  . Paroxysmal atrial fibrillation ([place]) 04/07/2015  . Chronic anticoagulation 04/07/2015  . Essential hypertension, benign 02/24/2014  . Sciatica 12/08/2013  . Hyperlipidemia 09/25/2013  . Osteopenia 11/19/2012  . Low back pain 01/26/2010  . Carcinoma in situ of breast 09/04/2009  . DEPRESSION, PROLONGED 09/04/2009  . DEGENERATIVE JOINT DISEASE, KNEES, BILATERAL 09/04/2009  . PERIPHERAL EDEMA 09/04/2009  . COLONIC POLYPS, ADENOMATOUS, HX OF 09/04/2009    Past Medical History:  Diagnosis Date  . Cancer ([place])     Left breast carcinoma in situ  . Cholelithiasis   . Depression   . 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 Hx of adenomatous colonic polyps   . Hyperlipidemia     Past Surgical History:  Procedure Laterality Date  . BREAST EXCISIONAL BIOPSY Left 2010   benign  . CHOLECYSTECTOMY    . CHOLECYSTECTOMY, LAPAROSCOPIC  '06   [place]  . KNEE ARTHROSCOPY     Left '00 [place]/ Right '08  . lumpectomy- remote     benign  . TOOTH EXTRACTION    . TOTAL KNEE ARTHROPLASTY  02/05/11   left  Social History   Tobacco Use  . Smoking status: Never Smoker  . Smokeless tobacco: Never Used  Vaping Use  . Vaping Use: Never used  Substance Use Topics  . Alcohol use: Yes    Comment: 1 glass per day  . Drug use: No    Family History  Problem Relation Age of Onset  . Diabetes Mother        Deceased in 15s  . Heart disease Mother        cad/MI-fatal  . Heart attack Mother   . Cancer Father        liver, Deceased in 60s  . Heart disease Sister   . Breast cancer Neg Hx   . Colon cancer Neg Hx     No Known Allergies  Medication list has been reviewed and updated.  Current Outpatient Medications on File Prior to Visit  Medication Sig Dispense Refill  . atorvastatin (LIPITOR) 20 MG  tablet TAKE 1 TABLET(20 MG) BY MOUTH DAILY 90 tablet 3  . Cholecalciferol 1000 UNITS tablet Take 1,000 Units by mouth daily.    Marland Kitchen FLUoxetine (PROZAC) 10 MG capsule Take 10 mg by mouth every other day.     . metoprolol tartrate (LOPRESSOR) 25 MG tablet Take 1 tablet (25 mg total) by mouth 2 (two) times daily. 180 tablet 0  . OVER THE COUNTER MEDICATION at bedtime as needed. Sleep Aid    . XARELTO 20 MG TABS tablet TAKE 1 TABLET(20 MG) BY MOUTH DAILY WITH SUPPER 90 tablet 1   No current facility-administered medications on file prior to visit.    Review of Systems:  As per HPI- otherwise negative.   Physical Examination: Vitals:   03/09/20 1304 03/09/20 1336  BP: 122/86   Pulse: (!) 116 85  Resp: 17   SpO2: 95%    Vitals:   03/09/20 1304  Weight: 163 lb (73.9 kg)  Height: 5\' 2"  (1.575 m)   Body mass index is 29.81 kg/m. Ideal Body Weight: Weight in (lb) to have BMI = 25: 136.4  GEN: no acute distress. Looks well HEENT: Atraumatic, Normocephalic.  Ears and Nose: No external deformity. CV:a fib, counted rate for 30 sec approx  90 BPM, No M/G/R. No JVD. No thrill. No extra heart sounds. PULM: CTA B, no wheezes, crackles, rhonchi. No retractions. No resp. distress. No accessory muscle use. ABD: S, NT, ND, +BS. No rebound. No HSM. EXTR: No c/c/e PSYCH: Normally interactive. Conversant.   Assessment and Plan: Hospital discharge follow-up - Plan: Urine Culture, CBC, Comprehensive metabolic panel  Needs flu shot - Plan: Flu Vaccine QUAD High Dose(Fluad)  Paroxysmal atrial fibrillation (HCC)  Pt here today for a follow-up visit Following up in person from recent inpt stay with pneumonia and UTI Repeat urine culture today Appropriate plan for a fib in place Will plan further follow- up pending labs. She is asked to let me know if any problems or concerns  Flu shot given  This visit occurred during the SARS-CoV-2 public health emergency.  Safety protocols were in place,  including screening questions prior to the visit, additional usage of staff PPE, and extensive cleaning of exam room while observing appropriate contact time as indicated for disinfecting solutions.    Signed Lamar Blinks, MD  Addendum 9/30, received her labs so far as below  Results for orders placed or performed in visit on 03/09/20  CBC  Result Value Ref Range   WBC 10.7 3.8 - 10.8 Thousand/uL   RBC 4.31  3.80 - 5.10 Million/uL   Hemoglobin 14.7 11.7 - 15.5 g/dL   HCT 42.4 35 - 45 %   MCV 98.4 80.0 - 100.0 fL   MCH 34.1 (H) 27.0 - 33.0 pg   MCHC 34.7 32.0 - 36.0 g/dL   RDW 12.1 11.0 - 15.0 %   Platelets 230 140 - 400 Thousand/uL   MPV 11.3 7.5 - 12.5 fL  Comprehensive metabolic panel  Result Value Ref Range   Glucose, Bld 85 65 - 99 mg/dL   BUN 10 7 - 25 mg/dL   Creat 0.74 0.60 - 0.88 mg/dL   BUN/Creatinine Ratio NOT APPLICABLE 6 - 22 (calc)   Sodium 140 135 - 146 mmol/L   Potassium 4.7 3.5 - 5.3 mmol/L   Chloride 104 98 - 110 mmol/L   CO2 28 20 - 32 mmol/L   Calcium 9.8 8.6 - 10.4 mg/dL   Total Protein 6.6 6.1 - 8.1 g/dL   Albumin 3.9 3.6 - 5.1 g/dL   Globulin 2.7 1.9 - 3.7 g/dL (calc)   AG Ratio 1.4 1.0 - 2.5 (calc)   Total Bilirubin 1.3 (H) 0.2 - 1.2 mg/dL   Alkaline phosphatase (APISO) 75 37 - 153 U/L   AST 13 10 - 35 U/L   ALT 12 6 - 29 U/L   Message to patient

## 2020-03-07 ENCOUNTER — Encounter: Payer: Self-pay | Admitting: Orthopaedic Surgery

## 2020-03-09 ENCOUNTER — Encounter: Payer: Self-pay | Admitting: Family Medicine

## 2020-03-09 ENCOUNTER — Other Ambulatory Visit: Payer: Self-pay

## 2020-03-09 ENCOUNTER — Ambulatory Visit (INDEPENDENT_AMBULATORY_CARE_PROVIDER_SITE_OTHER): Payer: Medicare Other | Admitting: Family Medicine

## 2020-03-09 VITALS — BP 122/86 | HR 85 | Resp 17 | Ht 62.0 in | Wt 163.0 lb

## 2020-03-09 DIAGNOSIS — I480 Paroxysmal atrial fibrillation: Secondary | ICD-10-CM | POA: Diagnosis not present

## 2020-03-09 DIAGNOSIS — R82998 Other abnormal findings in urine: Secondary | ICD-10-CM

## 2020-03-09 DIAGNOSIS — Z09 Encounter for follow-up examination after completed treatment for conditions other than malignant neoplasm: Secondary | ICD-10-CM

## 2020-03-09 DIAGNOSIS — Z23 Encounter for immunization: Secondary | ICD-10-CM

## 2020-03-09 NOTE — Patient Instructions (Signed)
Good to see you again as always!  I will be in touch with your urine culture and blood work I think your plan to try cardioversion is fine- worth a try!   Let me know if you would like me to order a Cologuard for colon cancer screening- this is an at home test that is non- invasive.  However, we do need to consider the need for colonoscopy IF you test positive!

## 2020-03-10 ENCOUNTER — Encounter: Payer: Self-pay | Admitting: Family Medicine

## 2020-03-10 LAB — CBC
HCT: 42.4 % (ref 35.0–45.0)
Hemoglobin: 14.7 g/dL (ref 11.7–15.5)
MCH: 34.1 pg — ABNORMAL HIGH (ref 27.0–33.0)
MCHC: 34.7 g/dL (ref 32.0–36.0)
MCV: 98.4 fL (ref 80.0–100.0)
MPV: 11.3 fL (ref 7.5–12.5)
Platelets: 230 10*3/uL (ref 140–400)
RBC: 4.31 10*6/uL (ref 3.80–5.10)
RDW: 12.1 % (ref 11.0–15.0)
WBC: 10.7 10*3/uL (ref 3.8–10.8)

## 2020-03-10 LAB — COMPREHENSIVE METABOLIC PANEL
AG Ratio: 1.4 (calc) (ref 1.0–2.5)
ALT: 12 U/L (ref 6–29)
AST: 13 U/L (ref 10–35)
Albumin: 3.9 g/dL (ref 3.6–5.1)
Alkaline phosphatase (APISO): 75 U/L (ref 37–153)
BUN: 10 mg/dL (ref 7–25)
CO2: 28 mmol/L (ref 20–32)
Calcium: 9.8 mg/dL (ref 8.6–10.4)
Chloride: 104 mmol/L (ref 98–110)
Creat: 0.74 mg/dL (ref 0.60–0.88)
Globulin: 2.7 g/dL (calc) (ref 1.9–3.7)
Glucose, Bld: 85 mg/dL (ref 65–99)
Potassium: 4.7 mmol/L (ref 3.5–5.3)
Sodium: 140 mmol/L (ref 135–146)
Total Bilirubin: 1.3 mg/dL — ABNORMAL HIGH (ref 0.2–1.2)
Total Protein: 6.6 g/dL (ref 6.1–8.1)

## 2020-03-10 LAB — URINE CULTURE
MICRO NUMBER:: 11010344
SPECIMEN QUALITY:: ADEQUATE

## 2020-03-11 ENCOUNTER — Encounter: Payer: Self-pay | Admitting: Family Medicine

## 2020-03-18 ENCOUNTER — Other Ambulatory Visit: Payer: Self-pay | Admitting: Cardiology

## 2020-03-28 ENCOUNTER — Other Ambulatory Visit: Payer: Self-pay | Admitting: Family Medicine

## 2020-03-28 DIAGNOSIS — E785 Hyperlipidemia, unspecified: Secondary | ICD-10-CM

## 2020-04-26 ENCOUNTER — Other Ambulatory Visit: Payer: Medicare Other

## 2020-05-11 HISTORY — PX: CARDIOVERSION: SHX1299

## 2020-06-16 ENCOUNTER — Encounter: Payer: Self-pay | Admitting: Family Medicine

## 2020-07-01 ENCOUNTER — Other Ambulatory Visit: Payer: Self-pay

## 2020-07-02 NOTE — Progress Notes (Deleted)
[place] at [facility] [address] [place], [place] [address] [phone]  Date:  07/04/2020   Name:  Sheila Schmidt   DOB:  Oct 23, 1935   MRN:  [id number]  PCP:  Darreld Mclean, MD    Chief Complaint: No chief complaint on file.   History of Present Illness:  Sheila Schmidt is a 85 y.o. very pleasant female patient who presents with the following:  Patient today for periodic follow-up visit Last seen by myself in September; that time she had recent been hospitalized in [facility] with pneumonia and UTI  She has history of atrial fibrillation for about 5 years, treated with rate control and anticoagulation Also history of hypertension, breast cancer degenerative joint disease of her knees, osteopenia  COVID-19 vaccine series complete,?  Booster Flu vaccine Bone density scan Mammogram is up-to-date Blood work completed in September Patient Active Problem List   Diagnosis Date Noted  . Bilateral primary osteoarthritis of knee 03/30/2019  . Paroxysmal atrial fibrillation ([place]) 04/07/2015  . Chronic anticoagulation 04/07/2015  . Essential hypertension, benign 02/24/2014  . Sciatica 12/08/2013  . Hyperlipidemia 09/25/2013  . Osteopenia 11/19/2012  . Low back pain 01/26/2010  . Carcinoma in situ of breast 09/04/2009  . DEPRESSION, PROLONGED 09/04/2009  . DEGENERATIVE JOINT DISEASE, KNEES, BILATERAL 09/04/2009  . PERIPHERAL EDEMA 09/04/2009  . COLONIC POLYPS, ADENOMATOUS, HX OF 09/04/2009    Past Medical History:  Diagnosis Date  . Cancer ([place])     Left breast carcinoma in situ  . Cholelithiasis   . Depression   . 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 Hx of adenomatous colonic polyps   . Hyperlipidemia     Past Surgical History:  Procedure Laterality Date  . BREAST EXCISIONAL BIOPSY Left 2010   benign  . CHOLECYSTECTOMY    . CHOLECYSTECTOMY, LAPAROSCOPIC  '06   [place]   . KNEE ARTHROSCOPY     Left '00 [facility]/ Right '08  . lumpectomy- remote     benign  . TOOTH EXTRACTION    . TOTAL KNEE ARTHROPLASTY  02/05/11   left    Social History   Tobacco Use  . Smoking status: Never Smoker  . Smokeless tobacco: Never Used  Vaping Use  . Vaping Use: Never used  Substance Use Topics  . Alcohol use: Yes    Comment: 1 glass per day  . Drug use: No    Family History  Problem Relation Age of Onset  . Diabetes Mother        Deceased in 23s  . Heart disease Mother        cad/MI-fatal  . Heart attack Mother   . Cancer Father        liver, Deceased in 46s  . Heart disease Sister   . Breast cancer Neg Hx   . Colon cancer Neg Hx     No Known Allergies  Medication list has been reviewed and updated.  Current Outpatient Medications on File Prior to Visit  Medication Sig Dispense Refill  . atorvastatin (LIPITOR) 20 MG tablet TAKE 1 TABLET(20 MG) BY MOUTH DAILY 90 tablet 3  . Cholecalciferol 1000 UNITS tablet Take 1,000 Units by mouth daily.    Marland Kitchen FLUoxetine (PROZAC) 10 MG capsule Take 10 mg by mouth every other day.     . metoprolol tartrate (LOPRESSOR) 25 MG tablet Take 1 tablet (25 mg total) by mouth  2 (two) times daily. 180 tablet 0  . OVER THE COUNTER MEDICATION at bedtime as needed. Sleep Aid    . XARELTO 20 MG TABS tablet TAKE 1 TABLET(20 MG) BY MOUTH DAILY WITH SUPPER 90 tablet 1   No current facility-administered medications on file prior to visit.    Review of Systems:  As per HPI- otherwise negative.   Physical Examination: There were no vitals filed for this visit. There were no vitals filed for this visit. There is no height or weight on file to calculate BMI. Ideal Body Weight:    GEN: no acute distress. HEENT: Atraumatic, Normocephalic.  Ears and Nose: No external deformity. CV: RRR, No M/G/R. No JVD. No thrill. No extra heart sounds. PULM: CTA B, no wheezes, crackles, rhonchi. No retractions. No resp. distress. No accessory  muscle use. ABD: S, NT, ND, +BS. No rebound. No HSM. EXTR: No c/c/e PSYCH: Normally interactive. Conversant.    Assessment and Plan: *** This visit occurred during the SARS-CoV-2 public health emergency.  Safety protocols were in place, including screening questions prior to the visit, additional usage of staff PPE, and extensive cleaning of exam room while observing appropriate contact time as indicated for disinfecting solutions.    Signed Lamar Blinks, MD

## 2020-07-02 NOTE — Patient Instructions (Incomplete)
It was great to see you again today, take care

## 2020-07-04 ENCOUNTER — Ambulatory Visit: Payer: Medicare Other | Admitting: Family Medicine

## 2020-07-04 DIAGNOSIS — E785 Hyperlipidemia, unspecified: Secondary | ICD-10-CM

## 2020-07-04 DIAGNOSIS — M8580 Other specified disorders of bone density and structure, unspecified site: Secondary | ICD-10-CM

## 2020-07-04 DIAGNOSIS — I10 Essential (primary) hypertension: Secondary | ICD-10-CM

## 2020-07-04 DIAGNOSIS — Z853 Personal history of malignant neoplasm of breast: Secondary | ICD-10-CM

## 2020-07-04 DIAGNOSIS — I480 Paroxysmal atrial fibrillation: Secondary | ICD-10-CM

## 2020-07-08 ENCOUNTER — Telehealth: Payer: Self-pay | Admitting: Orthopaedic Surgery

## 2020-07-08 DIAGNOSIS — M545 Low back pain, unspecified: Secondary | ICD-10-CM

## 2020-07-08 NOTE — Telephone Encounter (Signed)
Yes, MRI of L-S spine and new films of knee when she returns to the office-thanks

## 2020-07-08 NOTE — Telephone Encounter (Signed)
I called and spoke with patient and her daughter. They feel that patient still needs MRI of her lumbar spine because her back continues to bother her. She is also having significant right knee pain. The right knee is giving out on her and is getting much worse. They were unsure if you needed to do a MRI of the knee at the same time as the lumbar MRI or if she would only need x-rays to see if the right knee is ready to be replaced. I advised that if you needed only x-rays of the knee, we could do that in the office when she comes in to review MRI of the lumbar.  Please advise on what you would like for me to submit.  MRI of lumbar only and then see her for her knee when she comes in to review MRI or would you like a scan on the knee as well?

## 2020-07-08 NOTE — Telephone Encounter (Signed)
Pt called and wants you to schedule her an appt to get an MRI on her knee. CB [phone]

## 2020-07-08 NOTE — Telephone Encounter (Signed)
MRI order entered. I called patient and advised of plan.

## 2020-07-08 NOTE — Telephone Encounter (Signed)
Please advise 

## 2020-07-08 NOTE — Telephone Encounter (Signed)
Per my last office note I was going to order an MRI of her lumbar spine not the knee-please  call pt and confirm-thanks

## 2020-07-08 NOTE — Addendum Note (Signed)
Addended by: Meyer Cory on: 07/08/2020 04:40 PM   Modules accepted: Orders

## 2020-07-21 ENCOUNTER — Encounter: Payer: Self-pay | Admitting: Orthopaedic Surgery

## 2020-07-21 ENCOUNTER — Ambulatory Visit: Payer: Self-pay

## 2020-07-21 ENCOUNTER — Ambulatory Visit (INDEPENDENT_AMBULATORY_CARE_PROVIDER_SITE_OTHER): Payer: Medicare Other | Admitting: Orthopaedic Surgery

## 2020-07-21 ENCOUNTER — Other Ambulatory Visit: Payer: Self-pay

## 2020-07-21 VITALS — Ht 62.0 in

## 2020-07-21 DIAGNOSIS — M25561 Pain in right knee: Secondary | ICD-10-CM | POA: Diagnosis not present

## 2020-07-21 DIAGNOSIS — M170 Bilateral primary osteoarthritis of knee: Secondary | ICD-10-CM | POA: Diagnosis not present

## 2020-07-21 DIAGNOSIS — M5442 Lumbago with sciatica, left side: Secondary | ICD-10-CM

## 2020-07-21 DIAGNOSIS — G8929 Other chronic pain: Secondary | ICD-10-CM

## 2020-07-21 DIAGNOSIS — M5441 Lumbago with sciatica, right side: Secondary | ICD-10-CM

## 2020-07-21 NOTE — Progress Notes (Signed)
Office Visit Note   Patient: Sheila Schmidt           Date of Birth: 1936/01/21           MRN: [id number] Visit Date: 07/21/2020              Requested by: Sheila Mclean, MD [address]ila [address] Sheila Schmidt,  Sheila Schmidt [id number] PCP: Sheila Mclean, MD   Assessment & Plan: Visit Diagnoses:  1. Chronic pain of right knee   2. Bilateral primary osteoarthritis of knee   3. Chronic bilateral low back pain with bilateral sciatica     Plan: Sheila Schmidt is accompanied by her daughter and here for follow-up evaluation of problems she is having with her back and her right knee. I had scheduled an MRI scan of her lumbar spine thinking some of the discomfort in her right leg was related to her back but she had to reschedule and now is to have the scan in the next week or 10 days. She does have considerable trouble with her right knee with x-rays demonstrating significant arthritis. She has contemplated a knee replacement and we talked about that at length today. I think she really is deconditioned and would like her to get involved in an exercise program and was specific about certain exercises. She has had a prior left knee replacement is done well but it was many years ago. She has a number of comorbidities. I will plan to see her back after the MRI scan of her lumbar spine. Also discussed bracing for knee and Tylenol for pain. She is on Xarelto for atrial flutter  Follow-Up Instructions: Return After MRI scan lumbar spine.   Orders:  Orders Placed This Encounter  Procedures  . XR KNEE 3 VIEW RIGHT   No orders of the defined types were placed in this encounter.     Procedures: No procedures performed   Clinical Data: No additional findings.   Subjective: Chief Complaint  Patient presents with  . Right Knee - Pain  Patient presents today for right knee pain. She was last evaluated in July of last year for her knee. She said that her knee continues to worsen. She has  pain anteriorly and medially. She cannot walk long distances due to pain. No swelling. Her knee does grind. She does not take anything for pain. She was suppose to get an MRI of her L-Spine last year, but other issues came up which led to a hospital stay and she has not had it done yet. Recently had a bout with atrial flutter, urinary tract infection and pneumonia. All are presently resolved. She is on Xarelto  HPI  Review of Systems   Objective: Vital Signs: Ht 5\' 2"  (1.575 m)   BMI 29.81 kg/m   Physical Exam Constitutional:      Appearance: She is well-developed and well-nourished.  HENT:     Mouth/Throat:     Mouth: Oropharynx is clear and moist.  Eyes:     Extraocular Movements: EOM normal.     Pupils: Pupils are equal, round, and reactive to light.  Pulmonary:     Effort: Pulmonary effort is normal.  Skin:    General: Skin is warm and dry.  Neurological:     Mental Status: She is alert and oriented to person, place, and time.  Psychiatric:        Mood and Affect: Mood and affect normal.  Behavior: Behavior normal.     Ortho Exam awake alert and oriented x3. Comfortable sitting. Full extension right knee. The knee was not hot red or swollen. Does have increased varus with weightbearing and some medial joint pain. There is patellar crepitation. No opening with varus valgus stress. No distal edema. Right foot was warm  Specialty Comments:  No specialty comments available.  Imaging: XR KNEE 3 VIEW RIGHT  Result Date: 07/21/2020 Films of the right knee were obtained in several projections standing. There is evidence of tricompartmental degenerative arthritis predominantly in the medial compartment where there is subchondral sclerosis irregularity of the joint surface and peripheral osteophytes. Approximately 2 to 3 degrees of varus. Also seems to have decreased bone mineralization. No acute changes or ectopic calcification    PMFS History: Patient Active Problem  List   Diagnosis Date Noted  . Bilateral primary osteoarthritis of knee 03/30/2019  . Paroxysmal atrial fibrillation ([place]) 04/07/2015  . Chronic anticoagulation 04/07/2015  . Essential hypertension, benign 02/24/2014  . Sciatica 12/08/2013  . Hyperlipidemia 09/25/2013  . Osteopenia 11/19/2012  . Low back pain 01/26/2010  . Carcinoma in situ of breast 09/04/2009  . DEPRESSION, PROLONGED 09/04/2009  . DEGENERATIVE JOINT DISEASE, KNEES, BILATERAL 09/04/2009  . PERIPHERAL EDEMA 09/04/2009  . COLONIC POLYPS, ADENOMATOUS, HX OF 09/04/2009   Past Medical History:  Diagnosis Date  . Cancer ([place])     Left breast carcinoma in situ  . Cholelithiasis   . Depression   . 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 Hx of adenomatous colonic polyps   . Hyperlipidemia     Family History  Problem Relation Age of Onset  . Diabetes Mother        Deceased in 65s  . Heart disease Mother        cad/MI-fatal  . Heart attack Mother   . Cancer Father        liver, Deceased in 3s  . Heart disease Sister   . Breast cancer Neg Hx   . Colon cancer Neg Hx     Past Surgical History:  Procedure Laterality Date  . BREAST EXCISIONAL BIOPSY Left 2010   benign  . CHOLECYSTECTOMY    . CHOLECYSTECTOMY, LAPAROSCOPIC  '06   [place]  . KNEE ARTHROSCOPY     Left '00 [facility]/ Right '08  . lumpectomy- remote     benign  . TOOTH EXTRACTION    . TOTAL KNEE ARTHROPLASTY  02/05/11   left   Social History   Occupational History  . 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 Smoking status: Never Smoker  . Smokeless tobacco: Never Used  Vaping Use  . Vaping Use: Never used  Substance and Sexual Activity  . Alcohol use: Yes    Comment: 1 glass per day  . Drug use: No  . Sexual activity: Not on file

## 2020-07-30 ENCOUNTER — Ambulatory Visit
Admission: RE | Admit: 2020-07-30 | Discharge: 2020-07-30 | Disposition: A | Discharge status: Home / Self Care | Payer: Medicare Other | Source: Ambulatory Visit | Attending: Orthopaedic Surgery | Admitting: Orthopaedic Surgery

## 2020-07-30 ENCOUNTER — Other Ambulatory Visit: Payer: Self-pay

## 2020-07-30 DIAGNOSIS — M545 Low back pain, unspecified: Secondary | ICD-10-CM

## 2020-08-10 ENCOUNTER — Other Ambulatory Visit: Payer: Self-pay

## 2020-08-10 ENCOUNTER — Ambulatory Visit (INDEPENDENT_AMBULATORY_CARE_PROVIDER_SITE_OTHER): Payer: Medicare Other | Admitting: Orthopaedic Surgery

## 2020-08-10 ENCOUNTER — Encounter: Payer: Self-pay | Admitting: Orthopaedic Surgery

## 2020-08-10 VITALS — Ht 62.0 in | Wt 163.0 lb

## 2020-08-10 DIAGNOSIS — M5442 Lumbago with sciatica, left side: Secondary | ICD-10-CM

## 2020-08-10 DIAGNOSIS — M170 Bilateral primary osteoarthritis of knee: Secondary | ICD-10-CM

## 2020-08-10 DIAGNOSIS — M5441 Lumbago with sciatica, right side: Secondary | ICD-10-CM

## 2020-08-10 DIAGNOSIS — G8929 Other chronic pain: Secondary | ICD-10-CM

## 2020-08-10 NOTE — Progress Notes (Signed)
Office Visit Note   Patient: Sheila Schmidt           Date of Birth: April 18, 1936           MRN: [id number] Visit Date: 08/10/2020              Requested by: Darreld Mclean, MD [address] [place],   [id number] PCP: Darreld Mclean, MD   Assessment & Plan: Visit Diagnoses:  1. Bilateral primary osteoarthritis of knee   2. Chronic bilateral low back pain with bilateral sciatica     Plan: Mrs. Manpreet had an MRI scan of her lumbar spine demonstrating generalized degenerative disease especially affecting the facets with an anterior listhesis at L3-4 and L5-S1.  There were no advanced or compressive stenoses.  Mild spinal stenosis at L3-4.  She does on occasion have some pain referred to her lower extremities.  I think most of her pain is related to her back.  I have suggested physical therapy.  She is going to a gym and working on her back exercises.  I have asked her to check with the instructor regarding specific exercises that she did not want to go to therapy.  She is also wearing a pullover knee support for her arthritic right knee and also feeling better.  She is working on lower extremity strengthening exercises.  We will plan to see her back on a as needed basis.  She does have significant arthritis of her right knee but think she is deconditioned at this point not a good candidate for knee replacement until she is strong  Follow-Up Instructions: Return if symptoms worsen or fail to improve.   Orders:  No orders of the defined types were placed in this encounter.  No orders of the defined types were placed in this encounter.     Procedures: No procedures performed   Clinical Data: No additional findings.   Subjective: Chief Complaint  Patient presents with  . Lower Back - Follow-up    MRI review  Patient presents today for follow up on her lower back. She had an MRI done and is here today for those results.  No change in symptoms.  She is  going to a gym and working with riding a bike and lower extremity strengthening exercises.  Still having some back pain occasional lower extremity pain  HPI  Review of Systems   Objective: Vital Signs: Ht 5\' 2"  (1.575 m)   Wt 163 lb (73.9 kg)   BMI 29.81 kg/m   Physical Exam Constitutional:      Appearance: She is well-developed and well-nourished.  HENT:     Mouth/Throat:     Mouth: Oropharynx is clear and moist.  Eyes:     Extraocular Movements: EOM normal.     Pupils: Pupils are equal, round, and reactive to light.  Pulmonary:     Effort: Pulmonary effort is normal.  Skin:    General: Skin is warm and dry.  Neurological:     Mental Status: She is alert and oriented to person, place, and time.  Psychiatric:        Mood and Affect: Mood and affect normal.        Behavior: Behavior normal.     Ortho Exam awake alert and oriented x3.  Comfortable sitting.  Walking without any ambulatory aid other than a pullover knee support on the right.  She lacks just a few degrees to full knee extension and  flex probably 95 degrees without instability.  Mostly medial joint pain.  Straight leg raise negative.  No percussible tenderness of the lumbar spine.  Minimal limp referable to her right lower extremity Specialty Comments:  No specialty comments available.  Imaging: No results found.   PMFS History: Patient Active Problem List   Diagnosis Date Noted  . Bilateral primary osteoarthritis of knee 03/30/2019  . Paroxysmal atrial fibrillation ([place]) 04/07/2015  . Chronic anticoagulation 04/07/2015  . Essential hypertension, benign 02/24/2014  . Sciatica 12/08/2013  . Hyperlipidemia 09/25/2013  . Osteopenia 11/19/2012  . Low back pain 01/26/2010  . Carcinoma in situ of breast 09/04/2009  . DEPRESSION, PROLONGED 09/04/2009  . DEGENERATIVE JOINT DISEASE, KNEES, BILATERAL 09/04/2009  . PERIPHERAL EDEMA 09/04/2009  . COLONIC POLYPS, ADENOMATOUS, HX OF 09/04/2009   Past Medical  History:  Diagnosis Date  . Cancer ([place])     Left breast carcinoma in situ  . Cholelithiasis   . Depression   . 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 Hx of adenomatous colonic polyps   . Hyperlipidemia     Family History  Problem Relation Age of Onset  . Diabetes Mother        Deceased in 23s  . Heart disease Mother        cad/MI-fatal  . Heart attack Mother   . Cancer Father        liver, Deceased in 98s  . Heart disease Sister   . Breast cancer Neg Hx   . Colon cancer Neg Hx     Past Surgical History:  Procedure Laterality Date  . BREAST EXCISIONAL BIOPSY Left 2010   benign  . CHOLECYSTECTOMY    . CHOLECYSTECTOMY, LAPAROSCOPIC  '06   [place]  . KNEE ARTHROSCOPY     Left '00 [facility]/ Right '08  . lumpectomy- remote     benign  . TOOTH EXTRACTION    . TOTAL KNEE ARTHROPLASTY  02/05/11   left   Social History   Occupational History  . 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 Smoking status: Never Smoker  . Smokeless tobacco: Never Used  Vaping Use  . Vaping Use: Never used  Substance and Sexual Activity  . Alcohol use: Yes    Comment: 1 glass per day  . Drug use: No  . Sexual activity: Not on file

## 2020-08-15 NOTE — Patient Instructions (Addendum)
It was good to see you again today!  Let's try increasing your fluoxetine to 20 mg daily (prozac) to see if we can help with anxiety We will send you a cologaurd kit Stop by imaging on the ground floor after you get your labs drawn today- they may be able to do your bone density now   If all is well please see me in 6 months    Health Maintenance After Age 83 After age 26, you are at a higher risk for certain long-term diseases and infections as well as injuries from falls. Falls are a major cause of broken bones and head injuries in people who are older than age 58. Getting regular preventive care can help to keep you healthy and well. Preventive care includes getting regular testing and making lifestyle changes as recommended by your health care provider. Talk with your health care provider about:  Which screenings and tests you should have. A screening is a test that checks for a disease when you have no symptoms.  A diet and exercise plan that is right for you. What should I know about screenings and tests to prevent falls? Screening and testing are the best ways to find a health problem early. Early diagnosis and treatment give you the best chance of managing medical conditions that are common after age 16. Certain conditions and lifestyle choices may make you more likely to have a fall. Your health care provider may recommend:  Regular vision checks. Poor vision and conditions such as cataracts can make you more likely to have a fall. If you wear glasses, make sure to get your prescription updated if your vision changes.  Medicine review. Work with your health care provider to regularly review all of the medicines you are taking, including over-the-counter medicines. Ask your health care provider about any side effects that may make you more likely to have a fall. Tell your health care provider if any medicines that you take make you feel dizzy or sleepy.  Osteoporosis screening.  Osteoporosis is a condition that causes the bones to get weaker. This can make the bones weak and cause them to break more easily.  Blood pressure screening. Blood pressure changes and medicines to control blood pressure can make you feel dizzy.  Strength and balance checks. Your health care provider may recommend certain tests to check your strength and balance while standing, walking, or changing positions.  Foot health exam. Foot pain and numbness, as well as not wearing proper footwear, can make you more likely to have a fall.  Depression screening. You may be more likely to have a fall if you have a fear of falling, feel emotionally low, or feel unable to do activities that you used to do.  Alcohol use screening. Using too much alcohol can affect your balance and may make you more likely to have a fall. What actions can I take to lower my risk of falls? General instructions  Talk with your health care provider about your risks for falling. Tell your health care provider if: ? You fall. Be sure to tell your health care provider about all falls, even ones that seem minor. ? You feel dizzy, sleepy, or off-balance.  Take over-the-counter and prescription medicines only as told by your health care provider. These include any supplements.  Eat a healthy diet and maintain a healthy weight. A healthy diet includes low-fat dairy products, low-fat (lean) meats, and fiber from whole grains, beans, and lots of fruits and  vegetables. Home safety  Remove any tripping hazards, such as rugs, cords, and clutter.  Install safety equipment such as grab bars in bathrooms and safety rails on stairs.  Keep rooms and walkways well-lit. Activity  Follow a regular exercise program to stay fit. This will help you maintain your balance. Ask your health care provider what types of exercise are appropriate for you.  If you need a cane or walker, use it as recommended by your health care provider.  Wear  supportive shoes that have nonskid soles.   Lifestyle  Do not drink alcohol if your health care provider tells you not to drink.  If you drink alcohol, limit how much you have: ? 0-1 drink a day for women. ? 0-2 drinks a day for men.  Be aware of how much alcohol is in your drink. In the [place] one drink equals one typical bottle of beer (12 oz), one-half glass of wine (5 oz), or one shot of hard liquor (1 oz).  Do not use any products that contain nicotine or tobacco, such as cigarettes and e-cigarettes. If you need help quitting, ask your health care provider. Summary  Having a healthy lifestyle and getting preventive care can help to protect your health and wellness after age 56.  Screening and testing are the best way to find a health problem early and help you avoid having a fall. Early diagnosis and treatment give you the best chance for managing medical conditions that are more common for people who are older than age 50.  Falls are a major cause of broken bones and head injuries in people who are older than age 46. Take precautions to prevent a fall at home.  Work with your health care provider to learn what changes you can make to improve your health and wellness and to prevent falls. This information is not intended to replace advice given to you by your health care provider. Make sure you discuss any questions you have with your health care provider. Document Revised: 09/18/2018 Document Reviewed: 04/10/2017 Elsevier Patient Education  2021 [employer].

## 2020-08-15 NOTE — Progress Notes (Addendum)
[facility] [place], [place], [place] [address] [phone]  Date:  08/17/2020   Name:  Sheila Schmidt   DOB:  Mar 01, 1936   MRN:  [id number]  PCP:  Darreld Mclean, MD    Chief Complaint: Atrial Fibrillation and Hyperlipidemia   History of Present Illness:  Sheila Schmidt is a 85 y.o. very pleasant female patient who presents with the following:  Here today for follow-up visit-history of atrial fibrillation treated with rate control and anticoagulation, hyperlipidemia, osteopenia, breast cancer Would also like to have her ears checked  Last seen by myself in September-that time she had recently become ill while visiting her daughter in [place] and was admitted to a local hospital with pneumonia and UTI  She is currently seeing Dr. Durward Fortes about her knee arthritis and low back pain She has fairly significant arthritis in her right knee, has been told that if she were younger she would likely get a knee replacement  I do not see a recent cardiology note- her cardiologist is in [place] and they are treating her cardiac concerns.  She had cardioversion for ?a fib done in [facility] in January Mammogram done in July, normal DEXA is overdue- ordered for her today  COVID-19 booster- encouraged her to do this  Can update labs today if she would like Flu is done  She has some earwax and would like to have it removed  She would like to do cologaurd testing this year Last colon in 2009  Amiodarone Lipitor B12 by mouth daily Fluoxetine 10 every other day Entresto Xarelto  She does drive herself and lives independently Patient Active Problem List   Diagnosis Date Noted  . Bilateral primary osteoarthritis of knee 03/30/2019  . Paroxysmal atrial fibrillation ([place]) 04/07/2015  . Chronic anticoagulation 04/07/2015  . Essential hypertension, benign 02/24/2014  . Sciatica 12/08/2013  . Hyperlipidemia  09/25/2013  . Osteopenia 11/19/2012  . Low back pain 01/26/2010  . Carcinoma in situ of breast 09/04/2009  . DEPRESSION, PROLONGED 09/04/2009  . DEGENERATIVE JOINT DISEASE, KNEES, BILATERAL 09/04/2009  . PERIPHERAL EDEMA 09/04/2009  . COLONIC POLYPS, ADENOMATOUS, HX OF 09/04/2009    Past Medical History:  Diagnosis Date  . Cancer ([place])     Left breast carcinoma in situ  . Cholelithiasis   . Depression   . 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 Hx of adenomatous colonic polyps   . Hyperlipidemia     Past Surgical History:  Procedure Laterality Date  . BREAST EXCISIONAL BIOPSY Left 2010   benign  . CHOLECYSTECTOMY    . CHOLECYSTECTOMY, LAPAROSCOPIC  '06   [place]  . KNEE ARTHROSCOPY     Left '00 [facility]/ Right '08  . lumpectomy- remote     benign  . TOOTH EXTRACTION    . TOTAL KNEE ARTHROPLASTY  02/05/11   left    Social History   Tobacco Use  . Smoking status: Never Smoker  . Smokeless tobacco: Never Used  Vaping Use  . Vaping Use: Never used  Substance Use Topics  . Alcohol use: Yes    Comment: 1 glass per day  . Drug use: No    Family History  Problem Relation Age of Onset  . Diabetes Mother        Deceased in 25s  . Heart disease Mother        cad/MI-fatal  .  Heart attack Mother   . Cancer Father        liver, Deceased in 25s  . Heart disease Sister   . Breast cancer Neg Hx   . Colon cancer Neg Hx     No Known Allergies  Medication list has been reviewed and updated.  Current Outpatient Medications on File Prior to Visit  Medication Sig Dispense Refill  . amiodarone (PACERONE) 200 MG tablet Take 200 mg by mouth daily.    Marland Kitchen atorvastatin (LIPITOR) 20 MG tablet TAKE 1 TABLET(20 MG) BY MOUTH DAILY 90 tablet 3  . Cholecalciferol 1000 UNITS tablet Take 1,000 Units by mouth daily.    . melatonin 5 MG TABS Take 5 mg by mouth.    Marland Kitchen OVER THE COUNTER MEDICATION at bedtime as needed. Sleep Aid    . sacubitril-valsartan (ENTRESTO) 24-26  MG Take 1 tablet by mouth 2 (two) times daily.    Alveda Reasons 20 MG TABS tablet TAKE 1 TABLET(20 MG) BY MOUTH DAILY WITH SUPPER 90 tablet 1   No current facility-administered medications on file prior to visit.    Review of Systems:  As per HPI- otherwise negative.   Physical Examination: Vitals:   08/17/20 1327  BP: (!) 146/78  Pulse: (!) 57  Resp: 16  Temp: 98.3 F (36.8 C)  SpO2: 96%   Vitals:   08/17/20 1327  Weight: 169 lb (76.7 kg)  Height: 5\' 2"  (1.575 m)   Body mass index is 30.91 kg/m. Ideal Body Weight: Weight in (lb) to have BMI = 25: 136.4  GEN: no acute distress.  Overweight, looks well HEENT: Atraumatic, Normocephalic.  Removed a small amount of earwax from both ears with curette, normal ear canal and TM Ears and Nose: No external deformity. CV: RRR, No M/G/R. No JVD. No thrill. No extra heart sounds. PULM: CTA B, no wheezes, crackles, rhonchi. No retractions. No resp. distress. No accessory muscle use. ABD: S, NT, ND, +BS. No rebound. No HSM. EXTR: No c/c/e PSYCH: Normally interactive. Conversant.    Assessment and Plan: Paroxysmal atrial fibrillation (HCC)  Essential hypertension, benign - Plan: Basic metabolic panel, CBC  Dyslipidemia  Carcinoma in situ of left breast, unspecified type  Screening for colon cancer  Estrogen deficiency - Plan: DG Bone Density  Adjustment disorder with anxious mood - Plan: FLUoxetine (PROZAC) 20 MG capsule  Patient today for a follow-up visit.  Her cardiology care is now being done in [facility].  She was cardioverted in January and remains in sinus rhythm Blood pressure is slightly elevated but not worrisome Ordered Cologuard to screen for colon cancer.  She understands this may necessitate colonoscopy if positive.  However, her general state of health is good enough that I would encourage continued screening Ordered bone density [name] has been through a lot of changes.  Her husband died within  the last year, she has been shuttling back and forth between her home and living with her daughters.  Her daughters sometimes do not get along with each other.  This has been somewhat stressful.  She is currently taking fluoxetine 10 mg daily, we discussed increasing it to 20 and she would like to try this  This visit occurred during the SARS-CoV-2 public health emergency.  Safety protocols were in place, including screening questions prior to the visit, additional usage of staff PPE, and extensive cleaning of exam room while observing appropriate contact time as indicated for disinfecting solutions.    Signed Lamar Blinks, MD  Addendum 3/10, received her labs as below  Results for orders placed or performed in visit on 94/70/76  Basic metabolic panel  Result Value Ref Range   Sodium 139 135 - 145 mEq/L   Potassium 4.4 3.5 - 5.1 mEq/L   Chloride 103 96 - 112 mEq/L   CO2 28 19 - 32 mEq/L   Glucose, Bld 75 70 - 99 mg/dL   BUN 13 6 - 23 mg/dL   Creatinine, Ser 0.89 0.40 - 1.20 mg/dL   GFR 60.32 >60.00 mL/min   Calcium 9.7 8.4 - 10.5 mg/dL  CBC  Result Value Ref Range   WBC 9.5 4.0 - 10.5 K/uL   RBC 4.27 3.87 - 5.11 Mil/uL   Platelets 194.0 150.0 - 400.0 K/uL   Hemoglobin 14.7 12.0 - 15.0 g/dL   HCT 43.1 36.0 - 46.0 %   MCV 101.1 (H) 78.0 - 100.0 fl   MCHC 34.1 30.0 - 36.0 g/dL   RDW 13.3 11.5 - 15.5 %

## 2020-08-17 ENCOUNTER — Other Ambulatory Visit: Payer: Self-pay

## 2020-08-17 ENCOUNTER — Ambulatory Visit: Payer: Medicare Other | Admitting: Family Medicine

## 2020-08-17 ENCOUNTER — Encounter: Payer: Self-pay | Admitting: Family Medicine

## 2020-08-17 VITALS — BP 146/78 | HR 57 | Temp 98.3°F | Resp 16 | Ht 62.0 in | Wt 169.0 lb

## 2020-08-17 DIAGNOSIS — E785 Hyperlipidemia, unspecified: Secondary | ICD-10-CM

## 2020-08-17 DIAGNOSIS — I480 Paroxysmal atrial fibrillation: Secondary | ICD-10-CM

## 2020-08-17 DIAGNOSIS — Z1211 Encounter for screening for malignant neoplasm of colon: Secondary | ICD-10-CM

## 2020-08-17 DIAGNOSIS — I10 Essential (primary) hypertension: Secondary | ICD-10-CM

## 2020-08-17 DIAGNOSIS — E2839 Other primary ovarian failure: Secondary | ICD-10-CM

## 2020-08-17 DIAGNOSIS — D0592 Unspecified type of carcinoma in situ of left breast: Secondary | ICD-10-CM | POA: Diagnosis not present

## 2020-08-17 DIAGNOSIS — F4322 Adjustment disorder with anxiety: Secondary | ICD-10-CM

## 2020-08-17 MED ORDER — FLUOXETINE HCL 20 MG PO CAPS: 20.0000 mg | ORAL_CAPSULE | ORAL | 3 refills | Status: DC

## 2020-08-18 ENCOUNTER — Encounter: Payer: Self-pay | Admitting: Family Medicine

## 2020-08-18 LAB — BASIC METABOLIC PANEL
BUN: 13 mg/dL (ref 6–23)
CO2: 28 mEq/L (ref 19–32)
Calcium: 9.7 mg/dL (ref 8.4–10.5)
Chloride: 103 mEq/L (ref 96–112)
Creatinine, Ser: 0.89 mg/dL (ref 0.40–1.20)
GFR: 60.32 mL/min (ref >60.00)
Glucose, Bld: 75 mg/dL (ref 70–99)
Potassium: 4.4 mEq/L (ref 3.5–5.1)
Sodium: 139 mEq/L (ref 135–145)

## 2020-08-18 LAB — CBC
HCT: 43.1 % (ref 36.0–46.0)
Hemoglobin: 14.7 g/dL (ref 12.0–15.0)
MCHC: 34.1 g/dL (ref 30.0–36.0)
MCV: 101.1 fl — ABNORMAL HIGH (ref 78.0–100.0)
Platelets: 194 10*3/uL (ref 150.0–400.0)
RBC: 4.27 Mil/uL (ref 3.87–5.11)
RDW: 13.3 % (ref 11.5–15.5)
WBC: 9.5 10*3/uL (ref 4.0–10.5)

## 2020-08-18 NOTE — Addendum Note (Signed)
Addended by: Wynonia Musty A on: 08/18/2020 10:55 AM   Modules accepted: Orders

## 2020-10-03 DIAGNOSIS — S81801A Unspecified open wound, right lower leg, initial encounter: Secondary | ICD-10-CM | POA: Insufficient documentation

## 2020-10-04 ENCOUNTER — Telehealth: Payer: Self-pay | Admitting: Family Medicine

## 2020-10-04 DIAGNOSIS — L089 Local infection of the skin and subcutaneous tissue, unspecified: Secondary | ICD-10-CM

## 2020-10-04 DIAGNOSIS — W57XXXA Bitten or stung by nonvenomous insect and other nonvenomous arthropods, initial encounter: Secondary | ICD-10-CM

## 2020-10-04 DIAGNOSIS — S80861A Insect bite (nonvenomous), right lower leg, initial encounter: Secondary | ICD-10-CM

## 2020-10-04 NOTE — Telephone Encounter (Signed)
Caller: Sherri -Pt Daughter  Call Back @ [phone]  [place] beach [place] and was bitten by an insect. Patient has been seen for bite at urgent care and ED. Patient is requesting a referral to a wound care specialist now due to her leg not getting any better. (Right Leg is red and swollen)   [place] the [place] and prefers a referral sent to   [facility] [address], [place], [place] [address] [phone] Fax # [phone]  Or   [facility] Address: [place], [place] [address] [place] [phone] Fax # [phone]

## 2020-10-04 NOTE — Telephone Encounter (Signed)
Referral has been placed. 

## 2020-10-05 ENCOUNTER — Encounter: Payer: Self-pay | Admitting: Family Medicine

## 2021-01-02 ENCOUNTER — Other Ambulatory Visit: Payer: Self-pay | Admitting: Orthopaedic Surgery

## 2021-01-02 DIAGNOSIS — M545 Low back pain, unspecified: Secondary | ICD-10-CM

## 2021-02-12 ENCOUNTER — Ambulatory Visit (INDEPENDENT_AMBULATORY_CARE_PROVIDER_SITE_OTHER): Payer: Medicare Other

## 2021-02-12 VITALS — Ht 62.0 in | Wt 169.0 lb

## 2021-02-12 DIAGNOSIS — N879 Dysplasia of cervix uteri, unspecified: Secondary | ICD-10-CM | POA: Insufficient documentation

## 2021-02-12 DIAGNOSIS — G4700 Insomnia, unspecified: Secondary | ICD-10-CM | POA: Insufficient documentation

## 2021-02-12 DIAGNOSIS — L292 Pruritus vulvae: Secondary | ICD-10-CM | POA: Insufficient documentation

## 2021-02-12 DIAGNOSIS — F411 Generalized anxiety disorder: Secondary | ICD-10-CM | POA: Insufficient documentation

## 2021-02-12 DIAGNOSIS — Z0000 Encounter for general adult medical examination without abnormal findings: Secondary | ICD-10-CM | POA: Diagnosis not present

## 2021-02-12 DIAGNOSIS — R609 Edema, unspecified: Secondary | ICD-10-CM | POA: Insufficient documentation

## 2021-02-12 NOTE — Patient Instructions (Signed)
Sheila Schmidt , Thank you for taking time to come for your Medicare Wellness Visit. I appreciate your ongoing commitment to your health goals. Please review the following plan we discussed and let me know if I can assist you in the future.   Screening recommendations/referrals: Colonoscopy: No longer required Mammogram: Done 01/05/2020 - Repeat annually  Bone Density: Done 07/07/2013 - Repeat every 2 years  Recommended yearly ophthalmology/optometry visit for glaucoma screening and checkup Recommended yearly dental visit for hygiene and checkup  Vaccinations: Influenza vaccine: Done 03/09/2020 - Repeat annually Pneumococcal vaccine: Done 04/02/2012, due for Prevnar Tdap vaccine: Done 09/25/2013 -  Repeat in 10 years Shingles vaccine: Zostavax 2012 - Shingrix discussed. Please contact your pharmacy for coverage information.     Covid-19: Done 09/14/19 & 08/31/19 - due for booster  Advanced directives: Advance directive discussed with you today. I have provided a copy for you to complete at home and have notarized. Once this is complete please bring a copy in to our office so we can scan it into your chart.   Conditions/risks identified: Aim for 30 minutes of exercise or brisk walking each day, drink 6-8 glasses of water and eat lots of fruits and vegetables.   Next appointment: Follow up in one year for your annual wellness visit    Preventive Care 65 Years and Older, Female Preventive care refers to lifestyle choices and visits with your health care provider that can promote health and wellness. What does preventive care include? A yearly physical exam. This is also called an annual well check. Dental exams once or twice a year. Routine eye exams. Ask your health care provider how often you should have your eyes checked. Personal lifestyle choices, including: Daily care of your teeth and gums. Regular physical activity. Eating a healthy diet. Avoiding tobacco and drug use. Limiting alcohol  use. Practicing safe sex. Taking low-dose aspirin every day. Taking vitamin and mineral supplements as recommended by your health care provider. What happens during an annual well check? The services and screenings done by your health care provider during your annual well check will depend on your age, overall health, lifestyle risk factors, and family history of disease. Counseling  Your health care provider may ask you questions about your: Alcohol use. Tobacco use. Drug use. Emotional well-being. Home and relationship well-being. Sexual activity. Eating habits. History of falls. Memory and ability to understand (cognition). Work and work Statistician. Reproductive health. Screening  You may have the following tests or measurements: Height, weight, and BMI. Blood pressure. Lipid and cholesterol levels. These may be checked every 5 years, or more frequently if you are over 26 years old. Skin check. Lung cancer screening. You may have this screening every year starting at age 100 if you have a 30-pack-year history of smoking and currently smoke or have quit within the past 15 years. Fecal occult blood test (FOBT) of the stool. You may have this test every year starting at age 55. Flexible sigmoidoscopy or colonoscopy. You may have a sigmoidoscopy every 5 years or a colonoscopy every 10 years starting at age 79. Hepatitis C blood test. Hepatitis B blood test. Sexually transmitted disease (STD) testing. Diabetes screening. This is done by checking your blood sugar (glucose) after you have not eaten for a while (fasting). You may have this done every 1-3 years. Bone density scan. This is done to screen for osteoporosis. You may have this done starting at age 52. Mammogram. This may be done every 1-2 years. Talk  to your health care provider about how often you should have regular mammograms. Talk with your health care provider about your test results, treatment options, and if necessary,  the need for more tests. Vaccines  Your health care provider may recommend certain vaccines, such as: Influenza vaccine. This is recommended every year. Tetanus, diphtheria, and acellular pertussis (Tdap, Td) vaccine. You may need a Td booster every 10 years. Zoster vaccine. You may need this after age 64. Pneumococcal 13-valent conjugate (PCV13) vaccine. One dose is recommended after age 53. Pneumococcal polysaccharide (PPSV23) vaccine. One dose is recommended after age 63. Talk to your health care provider about which screenings and vaccines you need and how often you need them. This information is not intended to replace advice given to you by your health care provider. Make sure you discuss any questions you have with your health care provider. Document Released: 06/24/2015 Document Revised: 02/15/2016 Document Reviewed: 03/29/2015 Elsevier Interactive Patient Education  2017 [employer] Prevention in the Home Falls can cause injuries. They can happen to people of all ages. There are many things you can do to make your home safe and to help prevent falls. What can I do on the outside of my home? Regularly fix the edges of walkways and driveways and fix any cracks. Remove anything that might make you trip as you walk through a door, such as a raised step or threshold. Trim any bushes or trees on the path to your home. Use bright outdoor lighting. Clear any walking paths of anything that might make someone trip, such as rocks or tools. Regularly check to see if handrails are loose or broken. Make sure that both sides of any steps have handrails. Any raised decks and porches should have guardrails on the edges. Have any leaves, snow, or ice cleared regularly. Use sand or salt on walking paths during winter. Clean up any spills in your garage right away. This includes oil or grease spills. What can I do in the bathroom? Use night lights. Install grab bars by the toilet and in the  tub and shower. Do not use towel bars as grab bars. Use non-skid mats or decals in the tub or shower. If you need to sit down in the shower, use a plastic, non-slip stool. Keep the floor dry. Clean up any water that spills on the floor as soon as it happens. Remove soap buildup in the tub or shower regularly. Attach bath mats securely with double-sided non-slip rug tape. Do not have throw rugs and other things on the floor that can make you trip. What can I do in the bedroom? Use night lights. Make sure that you have a light by your bed that is easy to reach. Do not use any sheets or blankets that are too big for your bed. They should not hang down onto the floor. Have a firm chair that has side arms. You can use this for support while you get dressed. Do not have throw rugs and other things on the floor that can make you trip. What can I do in the kitchen? Clean up any spills right away. Avoid walking on wet floors. Keep items that you use a lot in easy-to-reach places. If you need to reach something above you, use a strong step stool that has a grab bar. Keep electrical cords out of the way. Do not use floor polish or wax that makes floors slippery. If you must use wax, use non-skid floor wax. Do  not have throw rugs and other things on the floor that can make you trip. What can I do with my stairs? Do not leave any items on the stairs. Make sure that there are handrails on both sides of the stairs and use them. Fix handrails that are broken or loose. Make sure that handrails are as long as the stairways. Check any carpeting to make sure that it is firmly attached to the stairs. Fix any carpet that is loose or worn. Avoid having throw rugs at the top or bottom of the stairs. If you do have throw rugs, attach them to the floor with carpet tape. Make sure that you have a light switch at the top of the stairs and the bottom of the stairs. If you do not have them, ask someone to add them for  you. What else can I do to help prevent falls? Wear shoes that: Do not have high heels. Have rubber bottoms. Are comfortable and fit you well. Are closed at the toe. Do not wear sandals. If you use a stepladder: Make sure that it is fully opened. Do not climb a closed stepladder. Make sure that both sides of the stepladder are locked into place. Ask someone to hold it for you, if possible. Clearly mark and make sure that you can see: Any grab bars or handrails. First and last steps. Where the edge of each step is. Use tools that help you move around (mobility aids) if they are needed. These include: Canes. Walkers. Scooters. Crutches. Turn on the lights when you go into a dark area. Replace any light bulbs as soon as they burn out. Set up your furniture so you have a clear path. Avoid moving your furniture around. If any of your floors are uneven, fix them. If there are any pets around you, be aware of where they are. Review your medicines with your doctor. Some medicines can make you feel dizzy. This can increase your chance of falling. Ask your doctor what other things that you can do to help prevent falls. This information is not intended to replace advice given to you by your health care provider. Make sure you discuss any questions you have with your health care provider. Document Released: 03/24/2009 Document Revised: 11/03/2015 Document Reviewed: 07/02/2014 Elsevier Interactive Patient Education  2017 [employer].

## 2021-02-12 NOTE — Progress Notes (Signed)
Subjective:   Sheila Schmidt is a 85 y.o. female who presents for Medicare Annual (Subsequent) preventive examination.  Virtual Visit via Telephone Note  I connected with  Sheila Schmidt on 02/12/21 at 11:20 AM EDT by telephone and verified that I am speaking with the correct person using two identifiers.  Location: Patient: Home Provider: [facility] Persons participating in the virtual visit: patient/Nurse Health Advisor   I discussed the limitations, risks, security and privacy concerns of performing an evaluation and management service by telephone and the availability of in person appointments. The patient expressed understanding and agreed to proceed.  Interactive audio and video telecommunications were attempted between this nurse and patient, however failed, due to patient having technical difficulties OR patient did not have access to video capability.  We continued and completed visit with audio only.  Some vital signs may be absent or patient reported.   Letroy Vazguez E Naisha Wisdom, LPN   Review of Systems     Cardiac Risk Factors include: advanced age (>69mn, >>44women);dyslipidemia;hypertension;Other (see comment), Risk factor comments: A. Fib.     Objective:    Today's Vitals   02/12/21 1117 02/12/21 1118  Weight: 169 lb (76.7 kg)   Height: '5\' 2"'$  (1.575 m)   PainSc:  6    Body mass index is 30.91 kg/m.  Advanced Directives 02/12/2021 07/31/2018 03/16/2015  Does Patient Have a Medical Advance Directive? No No No  Does patient want to make changes to medical advance directive? - Yes (MAU/Ambulatory/Procedural Areas - Information given) -  Would patient like information on creating a medical advance directive? No - Patient declined - No - patient declined information    Current Medications (verified) Outpatient Encounter Medications as of 02/12/2021  Medication Sig   albuterol (VENTOLIN HFA) 108 (90 Base) MCG/ACT inhaler SMARTSIG:2 Puff(s) By Mouth Every 4 Hours PRN    amiodarone (PACERONE) 200 MG tablet Take 200 mg by mouth daily.   atorvastatin (LIPITOR) 20 MG tablet TAKE 1 TABLET(20 MG) BY MOUTH DAILY   Cholecalciferol 1000 UNITS tablet Take 1,000 Units by mouth daily.   famotidine (PEPCID) 40 MG tablet Take 40 mg by mouth daily.   FLUoxetine (PROZAC) 10 MG capsule Take 10 mg by mouth daily.   melatonin 5 MG TABS Take 5 mg by mouth.   sacubitril-valsartan (ENTRESTO) 24-26 MG Take 1 tablet by mouth 2 (two) times daily.   XARELTO 20 MG TABS tablet TAKE 1 TABLET(20 MG) BY MOUTH DAILY WITH SUPPER   [DISCONTINUED] FLUoxetine (PROZAC) 20 MG capsule Take 1 capsule (20 mg total) by mouth every other day.   [DISCONTINUED] OVER THE COUNTER MEDICATION at bedtime as needed. Sleep Aid   No facility-administered encounter medications on file as of 02/12/2021.    Allergies (verified) Patient has no known allergies.   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   Hx of adenomatous colonic polyps    Hyperlipidemia    Past Surgical History:  Procedure Laterality Date   ATRIAL FLUTTER ABLATION     BREAST EXCISIONAL BIOPSY Left 2010   benign   CHOLECYSTECTOMY     CHO[place], [place]  [place]06   [place]     Left '[place]/ Right '08   lumpectomy- remote     benign   TOOTH EXTRACTION  TOTAL KNEE ARTHROPLASTY  02/05/2011   left   Family History  Problem Relation Age of Onset   Diabetes Mother        Deceased in 44s   Heart disease Mother        cad/MI-fatal   Heart attack Mother    Cancer Father        liver, Deceased in 92s   Heart disease Sister    Breast cancer Neg Hx    Colon cancer Neg Hx    Social History   Socioeconomic History   Marital status: Widowed    Spouse name: Not on file   Number of children: 3   Years of education: Not on file   Highest education level: Not  on file  Occupational History   Occupation: retired     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. Married '59, 3 daughters- '61, '64, '73; 2 grandchildren. Exercise - goes to gym 5/wk. ACP - directed to the [email or web address]   Lives with her daughter - goes between daughters home in [place] and her own home here   Social Determinants of Health   Financial Resource Strain: Low Risk    Difficulty of Paying Living Expenses: Not hard at all  Food Insecurity: No Food Insecurity   Worried About Charity fundraiser in the Last Year: Never true   Arboriculturist in the Last Year: Never true  Transportation Needs: No Transportation Needs   Lack of Transportation (Medical): No   Lack of Transportation (Non-Medical): No  Physical Activity: Insufficiently Active   Days of Exercise per Week: 7 days   Minutes of Exercise per Session: 20 min  Stress: No Stress Concern Present   Feeling of Stress : Not at all  Social Connections: Socially Isolated   Frequency of Communication with Friends and Family: More than three times a week   Frequency of Social Gatherings with Friends and Family: More than three times a week   Attends Religious Services: Never   Marine scientist or Organizations: No   Attends Archivist Meetings: Never   Marital Status: Widowed    Tobacco Counseling Counseling given: Not Answered   Clinical Intake:  Pre-visit preparation completed: Yes  Pain : 0-10 Pain Score: 6  Pain Type: Chronic pain Pain Location: Knee Pain Orientation: Right Pain Descriptors / Indicators: Aching, Sore, Discomfort, Throbbing, Tightness Pain  Onset: More than a month ago Pain Frequency: Intermittent     BMI - recorded: 30.91 Nutritional Status: BMI > 30  Obese Nutritional Risks: None Diabetes: No  How often do you need to have someone help you when you read instructions, pamphlets, or other written materials from your doctor or pharmacy?: 1 - Never  Diabetic? No  Interpreter Needed?: No  Information entered by :: Phinley Schall, LPN   Activities of Daily Living In your present state of health, do you have any difficulty performing the following activities: 02/12/2021  Hearing? Y  Comment mild - declines hearing difficulties  Vision? N  Difficulty concentrating or making decisions? N  Walking  or climbing stairs? N  Dressing or bathing? N  Doing errands, shopping? N  Preparing Food and eating ? N  Using the Toilet? N  In the past six months, have you accidently leaked urine? N  Do you have problems with loss of bowel control? N  Managing your Medications? N  Managing your Finances? N  Housekeeping or managing your Housekeeping? N  Some recent data might be hidden    Patient Care Team: Copland, Gay Filler, MD as PCP - General (Family Medicine) Constance Haw, MD as PCP - Electrophysiology (Cardiology) Magrinat, Virgie Dad, MD (Hematology and Oncology) Garald Balding, MD (Orthopedic Surgery) Vania Rea, MD (Obstetrics and Gynecology) Erroll Luna, MD (General Surgery) Deirdre Pippins, PA-C as Physician Assistant (Dermatology)  Indicate any recent Medical Services you may have received from other than [facility] providers in the past year (date may be approximate).     Assessment:   This is a routine wellness examination for Sheila Schmidt.  Hearing/Vision screen Hearing Screening - Comments:: C/o mild hearing difficulties - declines hearing aids Vision Screening - Comments:: Wears reading glasses only prn - up to date with annual eye exams with [facility] on [address]  Dietary issues and exercise activities  discussed: Current Exercise Habits: Home exercise routine, Type of exercise: walking;strength training/weights, Time (Minutes): 30, Frequency (Times/Week): 5, Weekly Exercise (Minutes/Week): 150, Intensity: Mild, Exercise limited by: cardiac condition(s);orthopedic condition(s)   Goals Addressed             This Visit's Progress    Maintain healthy active lifestyle.   On track      Depression Screen PHQ 2/9 Scores 02/12/2021 07/31/2018 04/26/2016 12/07/2014  PHQ - 2 Score 0 0 0 0    Fall Risk Fall Risk  02/12/2021 03/09/2020 07/31/2018 04/26/2016 01/23/2016  Falls in the past year? 1 0 0 No No  Number falls in past yr: 1 0 - - -  Injury with Fall? 1 0 - - -  Risk for fall due to : Orthopedic patient;History of fall(s) - - - -  Follow up Education provided;Falls prevention discussed - - - -    FALL RISK PREVENTION PERTAINING TO THE HOME:  Any stairs in or around the home? Yes  If so, are there any without handrails? No  Home free of loose throw rugs in walkways, pet beds, electrical cords, etc? Yes  Adequate lighting in your home to reduce risk of falls? Yes   ASSISTIVE DEVICES UTILIZED TO PREVENT FALLS:  Life alert? No  Use of a cane, walker or w/c? No  Grab bars in the bathroom? Yes  Shower chair or bench in shower? No  Elevated toilet seat or a handicapped toilet? No   TIMED UP AND GO:  Was the test performed? No . Telephonic visit  Cognitive Function: Normal cognitive status assessed by direct observation by this Nurse Health Advisor. No abnormalities found.    MMSE - Mini Mental State Exam 07/31/2018  Orientation to time 5  Orientation to Place 5  Registration 3  Attention/ Calculation 5  Recall 3  Language- name 2 objects 2  Language- repeat 1  Language- follow 3 step command 3  Language- read & follow direction 1  Write a sentence 1  Copy design 1  Total score 30        Immunizations Immunization History  Administered Date(s) Administered   Fluad  Quad(high Dose 65+) 03/09/2020   Influenza Split 04/23/2011, 04/02/2012   Influenza Whole 03/15/2009  Influenza, High Dose Seasonal PF 03/23/2013, 07/18/2016, 06/12/2017, 03/31/2018   Influenza,inj,Quad PF,6+ Mos 02/24/2014   Influenza-Unspecified 03/11/2014   PFIZER(Purple Top)SARS-COV-2 Vaccination 08/31/2019, 09/14/2019   Pneumococcal Polysaccharide-23 04/02/2012   Td 08/11/2002   Tdap 09/25/2013   Zoster, Live 09/21/2010    TDAP status: Up to date  Flu Vaccine status: Up to date  Pneumococcal vaccine status: Due, Education has been provided regarding the importance of this vaccine. Advised may receive this vaccine at local pharmacy or Health Dept. Aware to provide a copy of the vaccination record if obtained from local pharmacy or Health Dept. Verbalized acceptance and understanding.  Covid-19 vaccine status: Completed vaccines  Qualifies for Shingles Vaccine? Yes   Zostavax completed Yes   Shingrix Completed?: No.    Education has been provided regarding the importance of this vaccine. Patient has been advised to call insurance company to determine out of pocket expense if they have not yet received this vaccine. Advised may also receive vaccine at local pharmacy or Health Dept. Verbalized acceptance and understanding.  Screening Tests Health Maintenance  Topic Date Due   Zoster Vaccines- Shingrix (1 of 2) Never done   COVID-19 Vaccine (3 - Mixed Product risk series) 10/12/2019   INFLUENZA VACCINE  01/09/2021   TETANUS/TDAP  09/26/2023   DEXA SCAN  Completed   PNA vac Low Risk Adult  Completed   HPV VACCINES  Aged Out    Health Maintenance  Health Maintenance Due  Topic Date Due   Zoster Vaccines- Shingrix (1 of 2) Never done   COVID-19 Vaccine (3 - Mixed Product risk series) 10/12/2019   INFLUENZA VACCINE  01/09/2021    Colorectal cancer screening: No longer required.   Mammogram status: Completed 01/05/2020. Repeat every year she may consider later  Bone  Density status: Completed 07/07/2013. Results reflect: Bone density results: OSTEOPENIA. Repeat every 2 years. She will consider later  Lung Cancer Screening: (Low Dose CT Chest recommended if Age 3-80 years, 30 pack-year currently smoking OR have quit w/in 15years.) does not qualify.   Additional Screening:  Hepatitis C Screening: does not qualify  Vision Screening: Recommended annual ophthalmology exams for early detection of glaucoma and other disorders of the eye. Is the patient up to date with their annual eye exam?  Yes  Who is the provider or what is the name of the office in which the patient attends annual eye exams? [facility] on [address] If pt is not established with a provider, would they like to be referred to a provider to establish care? No .   Dental Screening: Recommended annual dental exams for proper oral hygiene  Community Resource Referral / Chronic Care Management: CRR required this visit?  No   CCM required this visit?  No      Plan:     I have personally reviewed and noted the following in the patient's chart:   Medical and social history Use of alcohol, tobacco or illicit drugs  Current medications and supplements including opioid prescriptions.  Functional ability and status Nutritional status Physical activity Advanced directives List of other physicians Hospitalizations, surgeries, and ER visits in previous 12 months Vitals Screenings to include cognitive, depression, and falls Referrals and appointments  In addition, I have reviewed and discussed with patient certain preventive protocols, quality metrics, and best practice recommendations. A written personalized care plan for preventive services as well as general preventive health recommendations were provided to patient.     Sandrea Hammond, LPN   [id number]  Nurse Notes: Would like referral to a new cardiologist as well as a rheumatologist for vasculitis and pulmonologist - Says this was  recommended by cardiologist in [facility].

## 2021-02-17 NOTE — Progress Notes (Signed)
[facility] [employer] [facility], [place], [place] [address] [phone]  Date:  02/20/2021   Name:  Sheila Schmidt   DOB:  [birth year]   MRN:  [id number]  PCP:  Darreld Mclean, MD    Chief Complaint: 6 month follow up and Hospitalization Follow-up (Pneumonia, covid 11/19/2020)   History of Present Illness:  Sheila Schmidt is a 85 y.o. very pleasant female patient who presents with the following:  Pt seen today for 6 month follow-up  Last visit with myself in March of this year- history of atrial fibrillation treated with rate control and anticoagulation, hyperlipidemia, osteopenia, breast cancer  At our last visit Sheila Schmidt was spending some time in [place] with her daughter and had actually transferred her cardiology care to that location Also, she was adjusting to losing her husband in the last year - we increased her fluoxetine to 20 mg  She ended up being admitted to the hospital in [facility] back in June with covid pneumonia and UTI- she was in for 11 days  She had a cardioversion last year, as far she knows she is still in sinus rhythm.  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  I reassured her it is benign  Sheila Schmidt is pretty upset right now as her 2 daughters are fighting with each other.  This was bad enough that she decided to leave [facility] and moved back to [place].  One of her daughters is living in the home with her along with her large dog  No CP or SOB  She is taking her Claudie Leach and amiodarone  Lab Results  Component Value Date   TSH 1.86 10/16/2016     Patient Active Problem List   Diagnosis Date Noted   Anxiety state 02/12/2021   Body fluid retention 02/12/2021   Cervical intraepithelial neoplasia 02/12/2021   Insomnia 02/12/2021   Pruritus of vulva 02/12/2021    Leg wound, right 10/03/2020   Bilateral primary osteoarthritis of knee 03/30/2019   Paroxysmal atrial fibrillation ([place]) 04/07/2015   Chronic anticoagulation 04/07/2015   Essential hypertension, benign 02/24/2014   Sciatica 12/08/2013   Hyperlipidemia 09/25/2013   Osteopenia 11/19/2012   Low back pain 01/26/2010   Carcinoma in situ of breast 09/04/2009   DEPRESSION, PROLONGED 09/04/2009   DEGENERATIVE JOINT DISEASE, KNEES, BILATERAL 09/04/2009   PERIPHERAL EDEMA 09/04/2009   COLONIC POLYPS, ADENOMATOUS, HX OF 09/04/2009   Malignant tumor of breast ([place]) 06/11/2009    Past Medical History:  Diagnosis Date   Cancer ([place])     Left breast carcinoma in situ   Cholelithiasis    Depression    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   Hx of adenomatous colonic polyps    Hyperlipidemia     Past Surgical History:  Procedure Laterality Date   ATRIAL FLUTTER ABLATION     BREAST EXCISIONAL BIOPSY Left 2010   benign   CHOLECYSTECTOMY     CHOLECYSTECTOMY, LAPAROSCOPIC  '06   [place] ARTHROSCOPY     Left '00 [place]/ Right '08   lumpectomy- remote     benign   TOOTH EXTRACTION     TOTAL KNEE ARTHROPLASTY  02/05/2011   left    Social History   Tobacco Use   Smoking status:  Never   Smokeless tobacco: Never  Vaping Use   Vaping Use: Never used  Substance Use Topics   Alcohol use: Yes    Comment: 1 glass per day   Drug use: No    Family History  Problem Relation Age of Onset   Diabetes Mother        Deceased in 63s   Heart disease Mother        cad/MI-fatal   Heart attack Mother    Cancer Father        liver, Deceased in 52s   Heart disease Sister    Breast cancer Neg Hx    Colon cancer Neg Hx     No Known Allergies  Medication list has been reviewed and updated.  Current Outpatient Medications on File Prior to Visit  Medication Sig Dispense Refill   albuterol (VENTOLIN HFA) 108 (90 Base) MCG/ACT inhaler SMARTSIG:2 Puff(s) By Mouth  Every 4 Hours PRN     amiodarone (PACERONE) 200 MG tablet Take 200 mg by mouth daily.     atorvastatin (LIPITOR) 20 MG tablet TAKE 1 TABLET(20 MG) BY MOUTH DAILY 90 tablet 3   Cholecalciferol 1000 UNITS tablet Take 1,000 Units by mouth daily.     famotidine (PEPCID) 40 MG tablet Take 40 mg by mouth daily.     FLUoxetine (PROZAC) 10 MG capsule Take 10 mg by mouth daily.     melatonin 5 MG TABS Take 5 mg by mouth.     sacubitril-valsartan (ENTRESTO) 24-26 MG Take 1 tablet by mouth 2 (two) times daily.     XARELTO 20 MG TABS tablet TAKE 1 TABLET(20 MG) BY MOUTH DAILY WITH SUPPER 90 tablet 1   No current facility-administered medications on file prior to visit.    Review of Systems:  As per HPI- otherwise negative.   Physical Examination: Vitals:   02/20/21 1125  BP: 134/80  Pulse: 97  Resp: 15  Temp: 97.6 F (36.4 C)  SpO2: 98%   Vitals:   02/20/21 1125  Weight: 164 lb (74.4 kg)  Height: '5\' 2"'$  (1.575 m)   Body mass index is 30 kg/m. Ideal Body Weight: Weight in (lb) to have BMI = 25: 136.4  GEN: no acute distress.  Overweight, looks well HEENT: Atraumatic, Normocephalic.  Ears and Nose: No external deformity. CV: RRR, No M/G/R. No JVD. No thrill. No extra heart sounds. PULM: CTA B, no wheezes, crackles, rhonchi. No retractions. No resp. distress. No accessory muscle use. ABD: S, NT, ND, +BS. No rebound. No HSM. EXTR: No c/c/e PSYCH: Normally interactive. Conversant.   EKG: possible atrial fib with left BBB, vs bigeminy  Compared with tracing from 10/21- similar morphology with rhythm change Assessment and Plan: Paroxysmal atrial fibrillation ([place]) - Plan: EKG 12-Lead, Ambulatory referral to Cardiology, CANCELED: Ambulatory referral to Cardiology  Essential hypertension, benign  Dyslipidemia  Influenza vaccine administered - Plan: Flu Vaccine QUAD High Dose(Fluad)  Shortness of breath - Plan: Ambulatory referral to Pulmonology  Patient seen today for follow-up.   She has history of atrial fib and is anticoagulated.  She had been getting her cardiology care in [place] [facility] [place], but now is back in [place].  She needs to establish with a new cardiologist.  I placed a referral for her to be seen here at the [facility]  Her rate is controlled, blood pressure acceptable  Patient also reports that her cardiologist in [facility] wanted her to be seen by pulmonology.  I have placed  this referral for her today  We discussed COVID-19 booster.  Gave flu shot today  I will request records from her cardiologist from [place] Already requested records from her recent hospital stay This visit occurred during the SARS-CoV-2 public health emergency.  Safety protocols were in place, including screening questions prior to the visit, additional usage of staff PPE, and extensive cleaning of exam room while observing appropriate contact time as indicated for disinfecting solutions.   Signed Lamar Blinks, MD

## 2021-02-20 ENCOUNTER — Ambulatory Visit: Payer: Medicare Other | Admitting: Family Medicine

## 2021-02-20 ENCOUNTER — Other Ambulatory Visit: Payer: Self-pay

## 2021-02-20 VITALS — BP 134/80 | HR 97 | Temp 97.6°F | Resp 15 | Ht 62.0 in | Wt 164.0 lb

## 2021-02-20 DIAGNOSIS — I10 Essential (primary) hypertension: Secondary | ICD-10-CM | POA: Diagnosis not present

## 2021-02-20 DIAGNOSIS — R0602 Shortness of breath: Secondary | ICD-10-CM | POA: Diagnosis not present

## 2021-02-20 DIAGNOSIS — E785 Hyperlipidemia, unspecified: Secondary | ICD-10-CM

## 2021-02-20 DIAGNOSIS — I480 Paroxysmal atrial fibrillation: Secondary | ICD-10-CM | POA: Diagnosis not present

## 2021-02-20 DIAGNOSIS — Z23 Encounter for immunization: Secondary | ICD-10-CM | POA: Diagnosis not present

## 2021-02-20 NOTE — Patient Instructions (Addendum)
Good to see you again today!  I will get you set up with cardiology here at the [facility] got your flu shot today Please get the new covid booster when you can- the "bivalent" shot   Ok to exercise but take it slow If you have any chest pain or shortness of breath please let me know or go to the ER if it lasts!

## 2021-02-22 ENCOUNTER — Encounter: Payer: Self-pay | Admitting: Family Medicine

## 2021-02-22 ENCOUNTER — Other Ambulatory Visit: Payer: Self-pay | Admitting: Family Medicine

## 2021-02-22 DIAGNOSIS — I776 Arteritis, unspecified: Secondary | ICD-10-CM

## 2021-03-06 ENCOUNTER — Telehealth: Payer: Self-pay | Admitting: Family Medicine

## 2021-03-06 NOTE — Telephone Encounter (Signed)
Sonja called from [facility] regarding having orders sent to have pt do home health physical therapy until she has surgery. She would like to go to a rehab center if possible. Becky Sax can be reached at [phone] if any additional questions occur. Please advise.

## 2021-03-07 NOTE — Telephone Encounter (Signed)
Tried calling to get some clarification. Mailbox was full.

## 2021-04-12 ENCOUNTER — Encounter: Payer: Self-pay | Admitting: Cardiology

## 2021-04-12 ENCOUNTER — Other Ambulatory Visit: Payer: Self-pay

## 2021-04-12 ENCOUNTER — Ambulatory Visit (INDEPENDENT_AMBULATORY_CARE_PROVIDER_SITE_OTHER): Payer: Medicare Other

## 2021-04-12 ENCOUNTER — Ambulatory Visit (INDEPENDENT_AMBULATORY_CARE_PROVIDER_SITE_OTHER): Payer: Medicare Other | Admitting: Cardiology

## 2021-04-12 VITALS — BP 120/84 | HR 82 | Ht 62.0 in | Wt 169.0 lb

## 2021-04-12 DIAGNOSIS — I480 Paroxysmal atrial fibrillation: Secondary | ICD-10-CM | POA: Diagnosis not present

## 2021-04-12 DIAGNOSIS — I447 Left bundle-branch block, unspecified: Secondary | ICD-10-CM

## 2021-04-12 DIAGNOSIS — Z7901 Long term (current) use of anticoagulants: Secondary | ICD-10-CM

## 2021-04-12 DIAGNOSIS — I420 Dilated cardiomyopathy: Secondary | ICD-10-CM

## 2021-04-12 DIAGNOSIS — I10 Essential (primary) hypertension: Secondary | ICD-10-CM

## 2021-04-12 NOTE — Patient Instructions (Signed)
Medication Instructions:  Your physician recommends that you continue on your current medications as directed. Please refer to the Current Medication list given to you today.  *If you need a refill on your cardiac medications before your next appointment, please call your pharmacy*   Lab Work: None If you have labs (blood work) drawn today and your tests are completely normal, you will receive your results only by: [place] (if you have MyChart) OR A paper copy in the mail If you have any lab test that is abnormal or we need to change your treatment, we will call you to review the results.   Testing/Procedures: Your physician has requested that you have an echocardiogram. Echocardiography is a painless test that uses sound waves to create images of your heart. It provides your doctor with information about the size and shape of your heart and how well your heart's chambers and valves are working. This procedure takes approximately one hour. There are no restrictions for this procedure.   ZIO XT- Long Term Monitor Instructions  Your physician has requested you wear a ZIO patch monitor for 14 days.  This is a single patch monitor. [employer] supplies one patch monitor per enrollment. Additional stickers are not available. Please do not apply patch if you will be having a Nuclear Stress Test,  Echocardiogram, Cardiac CT, MRI, or Chest Xray during the period you would be wearing the  monitor. The patch cannot be worn during these tests. You cannot remove and re-apply the  ZIO XT patch monitor.  Your ZIO patch monitor will be mailed 3 day USPS to your address on file. It may take 3-5 days  to receive your monitor after you have been enrolled.  Once you have received your monitor, please review the enclosed instructions. Your monitor  has already been registered assigning a specific monitor serial # to you.   Follow-Up: At [facility], you and your health needs are our priority.  As  part of our continuing mission to provide you with exceptional heart care, we have created designated Provider Care Teams.  These Care Teams include your primary Cardiologist (physician) and Advanced Practice Providers (APPs -  Physician Assistants and Nurse Practitioners) who all work together to provide you with the care you need, when you need it.  We recommend signing up for the patient portal called "MyChart".  Sign up information is provided on this After Visit Summary.  MyChart is used to connect with patients for Virtual Visits (Telemedicine).  Patients are able to view lab/test results, encounter notes, upcoming appointments, etc.  Non-urgent messages can be sent to your provider as well.   To learn more about what you can do with MyChart, go to [email or web address].    Your next appointment:   6 week(s)  The format for your next appointment:   In Person  Provider:   Jenne Campus, MD   Other Instructions

## 2021-04-12 NOTE — Progress Notes (Signed)
Cardiology Consultation:    Date:  04/12/2021   ID:  Sheila Schmidt, DOB 1936/01/19, MRN [id number]  PCP:  Sheila Mclean, MD  Cardiologist:  Jenne Campus, MD   Referring MD: Sheila Mclean, MD   Chief Complaint  Patient presents with   Atrial Fibrillation    History of Present Illness:    Sheila Schmidt is a 85 y.o. female who is being seen today for the evaluation of I would like to be reestablished as a patient at the request of Copland, Gay Filler, MD. with very complex past medical history.  She does have history of atrial flutter and apparently last December she had cardioversion done at [facility].  Previously she was followed by our EP team.  And she was told to have permanent atrial fibrillation she was anticoagulated.  Apparently she also has some history of cardiomyopathy looking at the list of medication I see Entresto but I have no documentation of that.  She lives for few months in the [facility] after her husband passed and now she decided to move back to our area.  She is weak tired exhausted but overall seems to be doing well.  Does not feel any dizziness or passing out.  No chest pain tightness squeezing pressure burning chest.  Past Medical History:  Diagnosis Date   Cancer ([place])     Left breast carcinoma in situ   Cholelithiasis    Depression    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   Hx of adenomatous colonic polyps    Hyperlipidemia     Past Surgical History:  Procedure Laterality Date   ATRIAL FLUTTER ABLATION     BREAST EXCISIONAL BIOPSY Left 2010   benign   CARDIOVERSION  05/2020   in [place], LAPAROSCOPIC  '06   [place] ARTHROSCOPY     Left '00 [place]/ Right '08   lumpectomy- remote     benign   TOOTH EXTRACTION     TOTAL KNEE ARTHROPLASTY  02/05/2011   left    Current Medications: Current Meds  Medication Sig   albuterol (VENTOLIN HFA) 108 (90 Base) MCG/ACT inhaler  Inhale 2 puffs into the lungs every 6 (six) hours as needed for wheezing or shortness of breath.   amiodarone (PACERONE) 200 MG tablet Take 100 mg by mouth daily.   atorvastatin (LIPITOR) 20 MG tablet TAKE 1 TABLET(20 MG) BY MOUTH DAILY (Patient taking differently: Take 20 mg by mouth daily.)   Cholecalciferol 1000 UNITS tablet Take 2,000 Units by mouth daily.   famotidine (PEPCID) 40 MG tablet Take 40 mg by mouth as needed for heartburn or indigestion.   FLUoxetine (PROZAC) 10 MG capsule Take 10 mg by mouth daily.   melatonin 5 MG TABS Take 5 mg by mouth at bedtime as needed (sleep).   sacubitril-valsartan (ENTRESTO) 24-26 MG Take 1 tablet by mouth 2 (two) times daily.   XARELTO 20 MG TABS tablet TAKE 1 TABLET(20 MG) BY MOUTH DAILY WITH SUPPER (Patient taking differently: Take 20 mg by mouth daily with supper.)     Allergies:   Patient has no known allergies.   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. Married '59, 3 daughters- '61, '64, '73; 2 grandchildren. Exercise - goes to gym 5/wk. ACP - directed to the [email or web address]   Lives with her daughter - goes between daughters home in [place] and her own home here   Social Determinants of Health   Financial Resource Strain: Low Risk    Difficulty of Paying Living Expenses: Not hard at all  Food Insecurity: No Food Insecurity   Worried About Charity fundraiser in  the Last Year: Never true   Arboriculturist in the Last Year: Never true  Transportation Needs: No Transportation Needs   Lack of Transportation (Medical): No   Lack of Transportation (Non-Medical): No  Physical Activity: Insufficiently Active   Days of Exercise per Week: 7 days   Minutes of Exercise per Session: 20 min  Stress: No Stress Concern Present   Feeling of Stress : Not at all  Social Connections: Socially Isolated   Frequency of Communication with Friends and Family: More than three times a week   Frequency of Social Gatherings with Friends and Family: More than three times a week   Attends Religious Services: Never   Marine scientist or Organizations: No   Attends Archivist Meetings: Never   Marital Status: Widowed     Family History: The patient's family history includes Cancer in her father; Diabetes in her mother; Heart attack in her mother; Heart disease in her mother and sister. There is no history of Breast cancer or Colon cancer. ROS:   Please see the history of present illness.    All 14 point review of systems negative except as described per history of present illness.  EKGs/Labs/Other Studies Reviewed:    The following studies were reviewed today:   EKG:  EKG is  ordered today.  The ekg ordered today demonstrates atrial flutter with variable AV conduction versus atypical atrial tachycardia, intraventricular conduction delay left bundle branch block pattern which is similar to previously  Recent Labs: 08/17/2020: BUN 13; Creatinine, Ser 0.89; Hemoglobin 14.7; Platelets 194.0; Potassium 4.4; Sodium 139  Recent Lipid Panel    Component Value Date/Time   CHOL 234 (H) 03/31/2018 1427   TRIG 89.0 03/31/2018 1427   HDL 58.20 03/31/2018 1427   CHOLHDL 4 03/31/2018 1427   VLDL 17.8 03/31/2018 1427   LDLCALC 158 (H) 03/31/2018 1427    Physical Exam:    VS:  BP 120/84 (BP Location: Right Arm, Patient Position: Sitting)   Pulse 82   Ht 5\' 2"   (1.575 m)   Wt 169 lb (76.7 kg)   SpO2 93%   BMI 30.91 kg/m     Wt Readings from Last 3 Encounters:  04/12/21 169 lb (76.7 kg)  02/20/21 164 lb (74.4 kg)  02/12/21 169 lb (76.7 kg)     GEN:  Well nourished, well developed in no acute distress HEENT: Normal NECK: No JVD; No carotid bruits LYMPHATICS: No lymphadenopathy CARDIAC: RRR, no murmurs, no rubs, no gallops RESPIRATORY:  Clear to  auscultation without rales, wheezing or rhonchi  ABDOMEN: Soft, non-tender, non-distended MUSCULOSKELETAL:  No edema; No deformity  SKIN: Warm and dry NEUROLOGIC:  Alert and oriented x 3 PSYCHIATRIC:  Normal affect   ASSESSMENT:    1. Paroxysmal atrial fibrillation (HCC)   2. Essential hypertension, benign   3. Chronic anticoagulation   4. Left bundle branch block    PLAN:    In order of problems listed above:  Paroxysmal atrial fibrillation/atrial flutter.  Today it looks like she is in atrial flutter with variable of ventricular rate.  I see some documentation of prior bradycardia.  She is already on amiodarone but I will not increase the dose of this medication because of bradycardia before.  I will put a 7-day Zio patch on her to see what can forward now with dealing with and was dehydrated variabilities. She is on Entresto which I suspect is because of cardiomyopathy have no documentation of this.  We will try to get records from her prior cardiologist as well as I will schedule her to have an echocardiogram. Dyslipidemia she is on Lipitor.  I do have fasting lipid profile but from a years ago.  We will make arrangements for fasting lipid profile to be done. Overall is a very complex situation lady with atypical atrial flutter/atrial tachycardia, does have history of cardioversion that was done in December of last year maintained sinus rhythm after that.  But looks like being in this abnormal rhythm for at least a month.  Likely she is anticoagulant which is a very good move.  She is on  amiodarone and we have to reconsider either cardioversion and bring her back to normal rhythm with discontinuation of antiarrhythmic therapy.  Decision regarding that move will be made based on results of her echocardiogram as well as Zio patch.   Medication Adjustments/Labs and Tests Ordered: Current medicines are reviewed at length with the patient today.  Concerns regarding medicines are outlined above.  No orders of the defined types were placed in this encounter.  No orders of the defined types were placed in this encounter.   Signed, Park Liter, MD, [facility]. 04/12/2021 2:25 PM    [facility]

## 2021-04-13 ENCOUNTER — Telehealth: Payer: Self-pay

## 2021-04-13 NOTE — Telephone Encounter (Signed)
Medical records request fax to Dr. Arlyce Harman office (previous cardiologist -[place])

## 2021-04-19 DIAGNOSIS — I447 Left bundle-branch block, unspecified: Secondary | ICD-10-CM

## 2021-04-19 DIAGNOSIS — Z7901 Long term (current) use of anticoagulants: Secondary | ICD-10-CM

## 2021-04-19 DIAGNOSIS — I10 Essential (primary) hypertension: Secondary | ICD-10-CM | POA: Diagnosis not present

## 2021-04-19 DIAGNOSIS — I480 Paroxysmal atrial fibrillation: Secondary | ICD-10-CM | POA: Diagnosis not present

## 2021-04-19 DIAGNOSIS — I420 Dilated cardiomyopathy: Secondary | ICD-10-CM

## 2021-04-21 ENCOUNTER — Other Ambulatory Visit: Payer: Self-pay

## 2021-04-21 ENCOUNTER — Ambulatory Visit (HOSPITAL_COMMUNITY): Payer: Medicare Other | Attending: Cardiology

## 2021-04-21 DIAGNOSIS — I480 Paroxysmal atrial fibrillation: Secondary | ICD-10-CM | POA: Diagnosis present

## 2021-04-21 DIAGNOSIS — I10 Essential (primary) hypertension: Secondary | ICD-10-CM

## 2021-04-21 DIAGNOSIS — Z7901 Long term (current) use of anticoagulants: Secondary | ICD-10-CM

## 2021-04-21 DIAGNOSIS — I420 Dilated cardiomyopathy: Secondary | ICD-10-CM | POA: Diagnosis present

## 2021-04-21 DIAGNOSIS — I447 Left bundle-branch block, unspecified: Secondary | ICD-10-CM

## 2021-04-21 LAB — ECHOCARDIOGRAM COMPLETE
Area-P 1/2: 3.72 cm2
P 1/2 time: 624 msec
S' Lateral: 2.9 cm

## 2021-05-01 ENCOUNTER — Encounter: Payer: Self-pay | Admitting: Family Medicine

## 2021-05-01 DIAGNOSIS — E785 Hyperlipidemia, unspecified: Secondary | ICD-10-CM

## 2021-05-01 MED ORDER — ATORVASTATIN CALCIUM 20 MG PO TABS: 20.0000 mg | ORAL_TABLET | Freq: Every day | ORAL | 0 refills | Status: DC

## 2021-05-18 NOTE — Progress Notes (Addendum)
[facility] [address], [place], [place] [address] [phone]  Date:  05/22/2021   Name:  Sheila Schmidt   DOB:  Jul 31, 1935   MRN:  [id number]  PCP:  Darreld Mclean, MD    Chief Complaint: 3 month follow up (Concerns/ questions:  1. pt c/o knee pain- alternate tx while waiting on the okay for surgery. 2. Pt thinks she needs to go on the Fluoxetine 20 mg dose d/t the Holiday season./Flu shot: received already)    History of Present Illness:  Sheila Schmidt is a 85 y.o. very pleasant female patient who presents with the following:  Patient seen today for periodic follow-up visit Most recent visit with myself was in September- history of atrial fibrillation treated with rate control and anticoagulation, hyperlipidemia, osteopenia, breast cancer Recently Rhena has been back and forth between [place] and [place].  Unfortunately her daughters got into a disagreement, she came back to [place] with 1 daughter who is been living in her home with her  She lost her husband in the last 35 months She was seen by Dr. Raliegh Ip with [facility] cardiology last month: 1. Paroxysmal atrial fibrillation (HCC)   2. Essential hypertension, benign   3. Chronic anticoagulation   4. Left bundle branch block     PLAN:     In order of problems listed above: Paroxysmal atrial fibrillation/atrial flutter.  Today it looks like she is in atrial flutter with variable of ventricular rate.  I see some documentation of prior bradycardia.  She is already on amiodarone but I will not increase the dose of this medication because of bradycardia before.  I will put a 7-day Zio patch on her to see what can forward now with dealing with and was dehydrated variabilities. She is on Entresto which I suspect is because of cardiomyopathy have no documentation of this.  We will try to get records from her prior cardiologist as well as I will  schedule her to have an echocardiogram. Dyslipidemia she is on Lipitor.  I do have fasting lipid profile but from a years ago.  We will make arrangements for fasting lipid profile to be done.  Shingles vaccine Pneumonia booster-can give dose of Prevnar COVID booster-recommended Flu shot up-to-date Can update lab work today if she would like  Sheila Schmidt's main concern today is knee pain - her right knee is hurting her a lot She visited with the orthopedist who did her her left knee, he is no longer doing joint replacement  She is using tylenol but it is not giving sufficient pain control all the time  She is concerned about thinning hair  They would like to check a urine culture today due to frequent UTI   Patient Active Problem List   Diagnosis Date Noted   Cancer ([place]) 05/25/2021   Left bundle branch block 04/12/2021   Dilated cardiomyopathy ([place]) 04/12/2021   Vasculitis ([place]) 02/22/2021   Anxiety state 02/12/2021   Body fluid retention 02/12/2021   Cervical intraepithelial neoplasia 02/12/2021   Insomnia 02/12/2021   Pruritus of vulva 02/12/2021   Leg wound, right 10/03/2020   Bilateral primary osteoarthritis of knee 03/30/2019   Paroxysmal atrial fibrillation ([place]) 04/07/2015   Chronic anticoagulation 04/07/2015   Paroxysmal atrial flutter ([place]) 03/16/2015   Essential hypertension, benign 02/24/2014   Sciatica 12/08/2013   Hyperlipidemia 09/25/2013   Osteopenia 11/19/2012   Low back pain 01/26/2010  Carcinoma in situ of breast 09/04/2009   DEPRESSION, PROLONGED 09/04/2009   DEGENERATIVE JOINT DISEASE, KNEES, BILATERAL 09/04/2009   PERIPHERAL EDEMA 09/04/2009   COLONIC POLYPS, ADENOMATOUS, HX OF 09/04/2009   Malignant tumor of breast ([place]) 06/11/2009    Past Medical History:  Diagnosis Date   Cancer ([place])     Left breast carcinoma in situ   Cholelithiasis    Depression    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   Hx of adenomatous colonic polyps     Hyperlipidemia     Past Surgical History:  Procedure Laterality Date   ATRIAL FLUTTER ABLATION     BREAST EXCISIONAL BIOPSY Left 2010   benign   CARDIOVERSION  05/2020   in [place], LAPAROSCOPIC  '06   [place] ARTHROSCOPY     Left '00 [place]/ Right '08   lumpectomy- remote     benign   TOOTH EXTRACTION     TOTAL KNEE ARTHROPLASTY  02/05/2011   left    Social History   Tobacco Use   Smoking status: Never   Smokeless tobacco: Never  Vaping Use   Vaping Use: Never used  Substance Use Topics   Alcohol use: Yes    Comment: 1 glass per day   Drug use: No    Family History  Problem Relation Age of Onset   Diabetes Mother        Deceased in 59s   Heart disease Mother        cad/MI-fatal   Heart attack Mother    Cancer Father        liver, Deceased in 63s   Heart disease Sister    Breast cancer Neg Hx    Colon cancer Neg Hx     No Known Allergies  Medication list has been reviewed and updated.  Current Outpatient Medications on File Prior to Visit  Medication Sig Dispense Refill   amiodarone (PACERONE) 200 MG tablet Take 100 mg by mouth daily.     atorvastatin (LIPITOR) 20 MG tablet Take 1 tablet (20 mg total) by mouth daily. 90 tablet 0   Cholecalciferol 1000 UNITS tablet Take 2,000 Units by mouth daily.     famotidine (PEPCID) 40 MG tablet Take 40 mg by mouth as needed for heartburn or indigestion.     FLUoxetine (PROZAC) 10 MG capsule Take 10 mg by mouth daily.     melatonin 5 MG TABS Take 5 mg by mouth at bedtime as needed (sleep).     sacubitril-valsartan (ENTRESTO) 24-26 MG Take 1 tablet by mouth 2 (two) times daily.     XARELTO 20 MG TABS tablet TAKE 1 TABLET(20 MG) BY MOUTH DAILY WITH SUPPER (Patient taking differently: Take 20 mg by mouth daily with supper.) 90 tablet 1   FLUoxetine (PROZAC) 20 MG capsule Take by mouth.     No current facility-administered medications on file prior to visit.    Review of  Systems:  As per HPI- otherwise negative.   Physical Examination: Vitals:   05/22/21 1141  BP: 132/76  Pulse: 94  Resp: 18  SpO2: 99%   Vitals:   05/22/21 1141  Weight: 172 lb 12.8 oz (78.4 kg)  Height: 5\' 2"  (1.575 m)   Body mass index is 31.61 kg/m. Ideal Body Weight: Weight in (lb) to have BMI = 25: 136.4  GEN: no acute distress.  Elderly woman, mild obesity.  Looks well HEENT:  Atraumatic, Normocephalic.  Ears and Nose: No external deformity. CV: a fib rate control, No M/G/R. No JVD. No thrill. No extra heart sounds. PULM: CTA B, no wheezes, crackles, rhonchi. No retractions. No resp. distress. No accessory muscle use. ABD: S, NT, ND, +BS. No rebound. No HSM. EXTR: No c/c/e PSYCH: Normally interactive. Conversant.  Right knee joint is thickened without apparent effusion.  There are some stiffness and discomfort with range of motion  Assessment and Plan: Acute pain of right knee - Plan: Ambulatory referral to Orthopedic Surgery, acetaminophen-codeine (TYLENOL #3) 300-30 MG tablet  Shortness of breath - Plan: albuterol (VENTOLIN HFA) 108 (90 Base) MCG/ACT inhaler  Immunization due - Plan: Pneumococcal conjugate vaccine 20-valent (Prevnar 20)  Frequent UTI - Plan: Urine Culture  Hyperlipidemia, unspecified hyperlipidemia type  Essential hypertension, benign - Plan: CBC, Comprehensive metabolic panel  Screening for colon cancer - Plan: CANCELED: Cologuard  Patient seen today for follow-up.  Her main concern today is right knee pain, likely due to osteoarthritis.  She reports that when she saw orthopedist he took x-rays and told her that she likely would benefit from joint replacement-however, he was worried about her overall health/ability to tolerate surgery and was no longer doing joint replacements himself.  Patient reports her cardiologist feels she likely could have surgery if absolutely necessary  I will refer to orthopedics for another look, possibly steroid  injection could be helpful  Prescribed Tylenol with codeine to use as needed for more severe pain.  Cautioned that this can cause sedation  Pneumonia booster today  Labs and urine culture pending  Signed Lamar Blinks, MD  Received labs as below-message to patient  Results for orders placed or performed in visit on 05/22/21  Urine Culture   Specimen: Urine  Result Value Ref Range   MICRO NUMBER: [id number]    SPECIMEN QUALITY: Adequate    Sample Source NOT GIVEN    STATUS: FINAL    ISOLATE 1:      Mixed genital flora isolated. These superficial bacteria are not indicative of a urinary tract infection. No further organism identification is warranted on this specimen. If clinically indicated, recollect clean-catch, mid-stream urine and transfer  immediately to Urine Culture Transport Tube.   CBC  Result Value Ref Range   WBC 8.3 4.0 - 10.5 K/uL   RBC 3.82 (L) 3.87 - 5.11 Mil/uL   Platelets 195.0 150.0 - 400.0 K/uL   Hemoglobin 13.1 12.0 - 15.0 g/dL   HCT 39.7 36.0 - 46.0 %   MCV 103.9 (H) 78.0 - 100.0 fl   MCHC 33.1 30.0 - 36.0 g/dL   RDW 14.2 11.5 - 15.5 %  Comprehensive metabolic panel  Result Value Ref Range   Sodium 139 135 - 145 mEq/L   Potassium 4.6 3.5 - 5.1 mEq/L   Chloride 103 96 - 112 mEq/L   CO2 29 19 - 32 mEq/L   Glucose, Bld 91 70 - 99 mg/dL   BUN 19 6 - 23 mg/dL   Creatinine, Ser 1.09 0.40 - 1.20 mg/dL   Total Bilirubin 1.4 (H) 0.2 - 1.2 mg/dL   Alkaline Phosphatase 45 39 - 117 U/L   AST 17 0 - 37 U/L   ALT 15 0 - 35 U/L   Total Protein 6.4 6.0 - 8.3 g/dL   Albumin 4.0 3.5 - 5.2 g/dL   GFR 47.04 (L) >60.00 mL/min   Calcium 10.3 8.4 - 10.5 mg/dL   Received urine culture 12/20- message to pt

## 2021-05-18 NOTE — Patient Instructions (Addendum)
It was good to see you again today, assuming all is well we can plan to visit in about 6 months  Try the tylenol with codeine (#3) as needed for pain in your knee This may cause drowsiness- use with caution and do not combine with alcohol  We will get you set up to see orthopedics about other options for your knee  Let me know if your pain is not under decent control   Please stop by the lab for blood work and a urine culture

## 2021-05-22 ENCOUNTER — Ambulatory Visit: Payer: Medicare Other | Admitting: Family Medicine

## 2021-05-22 ENCOUNTER — Encounter: Payer: Self-pay | Admitting: Family Medicine

## 2021-05-22 VITALS — BP 132/76 | HR 94 | Resp 18 | Ht 62.0 in | Wt 172.8 lb

## 2021-05-22 DIAGNOSIS — I10 Essential (primary) hypertension: Secondary | ICD-10-CM | POA: Diagnosis not present

## 2021-05-22 DIAGNOSIS — R0602 Shortness of breath: Secondary | ICD-10-CM | POA: Diagnosis not present

## 2021-05-22 DIAGNOSIS — M25561 Pain in right knee: Secondary | ICD-10-CM | POA: Diagnosis not present

## 2021-05-22 DIAGNOSIS — Z23 Encounter for immunization: Secondary | ICD-10-CM

## 2021-05-22 DIAGNOSIS — Z1211 Encounter for screening for malignant neoplasm of colon: Secondary | ICD-10-CM

## 2021-05-22 DIAGNOSIS — N390 Urinary tract infection, site not specified: Secondary | ICD-10-CM

## 2021-05-22 DIAGNOSIS — E785 Hyperlipidemia, unspecified: Secondary | ICD-10-CM

## 2021-05-22 LAB — CBC
HCT: 39.7 % (ref 36.0–46.0)
Hemoglobin: 13.1 g/dL (ref 12.0–15.0)
MCHC: 33.1 g/dL (ref 30.0–36.0)
MCV: 103.9 fl — ABNORMAL HIGH (ref 78.0–100.0)
Platelets: 195 10*3/uL (ref 150.0–400.0)
RBC: 3.82 Mil/uL — ABNORMAL LOW (ref 3.87–5.11)
RDW: 14.2 % (ref 11.5–15.5)
WBC: 8.3 10*3/uL (ref 4.0–10.5)

## 2021-05-22 LAB — COMPREHENSIVE METABOLIC PANEL
ALT: 15 U/L (ref 0–35)
AST: 17 U/L (ref 0–37)
Albumin: 4 g/dL (ref 3.5–5.2)
Alkaline Phosphatase: 45 U/L (ref 39–117)
BUN: 19 mg/dL (ref 6–23)
CO2: 29 mEq/L (ref 19–32)
Calcium: 10.3 mg/dL (ref 8.4–10.5)
Chloride: 103 mEq/L (ref 96–112)
Creatinine, Ser: 1.09 mg/dL (ref 0.40–1.20)
GFR: 47.04 mL/min — ABNORMAL LOW (ref >60.00)
Glucose, Bld: 91 mg/dL (ref 70–99)
Potassium: 4.6 mEq/L (ref 3.5–5.1)
Sodium: 139 mEq/L (ref 135–145)
Total Bilirubin: 1.4 mg/dL — ABNORMAL HIGH (ref 0.2–1.2)
Total Protein: 6.4 g/dL (ref 6.0–8.3)

## 2021-05-22 MED ORDER — ALBUTEROL SULFATE HFA 108 (90 BASE) MCG/ACT IN AERS: 2.0000 | INHALATION_SPRAY | Freq: Four times a day (QID) | RESPIRATORY_TRACT | 6 refills | Status: DC | PRN

## 2021-05-22 MED ORDER — ACETAMINOPHEN-CODEINE #3 300-30 MG PO TABS: 1.0000 | ORAL_TABLET | Freq: Four times a day (QID) | ORAL | 0 refills | Status: DC | PRN

## 2021-05-23 ENCOUNTER — Other Ambulatory Visit (INDEPENDENT_AMBULATORY_CARE_PROVIDER_SITE_OTHER): Payer: Medicare Other

## 2021-05-23 ENCOUNTER — Encounter: Payer: Self-pay | Admitting: Family Medicine

## 2021-05-23 DIAGNOSIS — D7589 Other specified diseases of blood and blood-forming organs: Secondary | ICD-10-CM

## 2021-05-23 DIAGNOSIS — Z1231 Encounter for screening mammogram for malignant neoplasm of breast: Secondary | ICD-10-CM

## 2021-05-23 LAB — VITAMIN B12: Vitamin B-12: 267 pg/mL (ref 211–911)

## 2021-05-23 LAB — URINE CULTURE
MICRO NUMBER:: 12744346
SPECIMEN QUALITY:: ADEQUATE

## 2021-05-23 LAB — FOLATE: Folate: 20 ng/mL (ref >5.9)

## 2021-05-25 DIAGNOSIS — C801 Malignant (primary) neoplasm, unspecified: Secondary | ICD-10-CM | POA: Insufficient documentation

## 2021-05-30 ENCOUNTER — Ambulatory Visit (INDEPENDENT_AMBULATORY_CARE_PROVIDER_SITE_OTHER): Payer: Medicare Other | Admitting: Orthopaedic Surgery

## 2021-05-30 ENCOUNTER — Encounter: Payer: Self-pay | Admitting: Family Medicine

## 2021-05-30 ENCOUNTER — Other Ambulatory Visit: Payer: Self-pay

## 2021-05-30 ENCOUNTER — Ambulatory Visit (INDEPENDENT_AMBULATORY_CARE_PROVIDER_SITE_OTHER): Payer: Medicare Other

## 2021-05-30 DIAGNOSIS — M1711 Unilateral primary osteoarthritis, right knee: Secondary | ICD-10-CM

## 2021-05-30 DIAGNOSIS — M25561 Pain in right knee: Secondary | ICD-10-CM

## 2021-05-30 DIAGNOSIS — G8929 Other chronic pain: Secondary | ICD-10-CM

## 2021-05-31 ENCOUNTER — Ambulatory Visit (INDEPENDENT_AMBULATORY_CARE_PROVIDER_SITE_OTHER): Payer: Medicare Other | Admitting: Cardiology

## 2021-05-31 ENCOUNTER — Encounter: Payer: Self-pay | Admitting: Cardiology

## 2021-05-31 ENCOUNTER — Telehealth (HOSPITAL_BASED_OUTPATIENT_CLINIC_OR_DEPARTMENT_OTHER): Payer: Self-pay

## 2021-05-31 ENCOUNTER — Telehealth: Payer: Self-pay | Admitting: Orthopaedic Surgery

## 2021-05-31 VITALS — BP 126/84 | HR 72 | Ht 62.0 in | Wt 173.0 lb

## 2021-05-31 DIAGNOSIS — I447 Left bundle-branch block, unspecified: Secondary | ICD-10-CM | POA: Diagnosis not present

## 2021-05-31 DIAGNOSIS — I4892 Unspecified atrial flutter: Secondary | ICD-10-CM

## 2021-05-31 DIAGNOSIS — I420 Dilated cardiomyopathy: Secondary | ICD-10-CM | POA: Diagnosis not present

## 2021-05-31 DIAGNOSIS — E782 Mixed hyperlipidemia: Secondary | ICD-10-CM | POA: Diagnosis not present

## 2021-05-31 NOTE — Progress Notes (Signed)
Cardiology Office Note:    Date:  05/31/2021   ID:  Sheila Schmidt, DOB Aug 12, 1935, MRN [id number]  PCP:  Darreld Mclean, MD  Cardiologist:  Jenne Campus, MD    Referring MD: Darreld Mclean, MD   Chief Complaint  Patient presents with   Clearance TBD    Dr. Erlinda Hong TKR R side    Results    Monitor and ECHO    History of Present Illness:    Sheila Schmidt is a 85 y.o. female with a past medical history significant for paroxysmal atrial fibrillation/atrial flutter.  Also history of cardiomyopathy.  She is originally from [facility] she relocated to the area about a month ago.  Apparently she lost her husband there and eventually [place]  She was seen by cardiologist at [facility].  She did have cardiomyopathy managed appropriately with Entresto she also got paroxysmal atrial fibrillation with 1 cardioversion done in the summertime.  However when she presented to my office she was in atypical atrial flutter.  I put monitor on her which showed relatively rate controlled atrial flutter which was all the time.  Also she got echocardiogram done which showed ejection fraction 45 to 50% with moderately enlarged left atrium.  She comes today to talk about her issues overall she seems to be doing well except biggest problem is pain in her knees and she still cannot have a knee surgery on the right side.  I told her before we get her rhythm straight I would wait.  We talked at length about potential cardioversion.  She is taking Xarelto without interruption she is getting ready to have cardioversion done and we did explain procedure including all risk benefits as well as alternatives.  Past Medical History:  Diagnosis Date   Cancer ([place])     Left breast carcinoma in situ   Cholelithiasis    Depression    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   Hx of adenomatous colonic polyps    Hyperlipidemia     Past Surgical History:   Procedure Laterality Date   ATRIAL FLUTTER ABLATION     BREAST EXCISIONAL BIOPSY Left 2010   benign   CARDIOVERSION  05/2020   in [place], LAPAROSCOPIC  '06   [place] ARTHROSCOPY     Left '00 [place]/ Right '08   lumpectomy- remote     benign   TOOTH EXTRACTION     TOTAL KNEE ARTHROPLASTY  02/05/2011   left    Current Medications: Current Meds  Medication Sig   acetaminophen-codeine (TYLENOL #3) 300-30 MG tablet Take 1 tablet by mouth every 6 (six) hours as needed for moderate pain.   albuterol (VENTOLIN HFA) 108 (90 Base) MCG/ACT inhaler Inhale 2 puffs into the lungs every 6 (six) hours as needed for wheezing or shortness of breath.   amiodarone (PACERONE) 200 MG tablet Take 100 mg by mouth daily.   atorvastatin (LIPITOR) 20 MG tablet Take 1 tablet (20 mg total) by mouth daily.   Cholecalciferol 1000 UNITS tablet Take 2,000 Units by mouth daily.   famotidine (PEPCID) 40 MG tablet Take 40 mg by mouth as needed for heartburn or indigestion.   FLUoxetine (PROZAC) 10 MG capsule Take 10 mg by mouth daily.   FLUoxetine (PROZAC) 20 MG capsule Take 20 mg by mouth daily.   melatonin 5 MG TABS Take 5 mg  by mouth at bedtime as needed (sleep).   sacubitril-valsartan (ENTRESTO) 24-26 MG Take 1 tablet by mouth 2 (two) times daily.   XARELTO 20 MG TABS tablet TAKE 1 TABLET(20 MG) BY MOUTH DAILY WITH SUPPER (Patient taking differently: Take 20 mg by mouth daily with supper.)     Allergies:   Patient has no known allergies.   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 [employer]. Married '59, 3 daughters- '61, '64, '73; 2 grandchildren. Exercise - goes to gym 5/wk. ACP - directed to the [email or web address]   Lives with her daughter - goes between daughters home in [place] and her own home here   Social Determinants of Health   Financial Resource Strain: Low Risk    Difficulty of Paying Living Expenses: Not hard at all  Food Insecurity: No Food Insecurity   Worried About Charity fundraiser in the Last Year: Never true   Arboriculturist in the Last Year: Never true  Transportation Needs: No Transportation Needs   Lack of Transportation (Medical): No   Lack of Transportation (Non-Medical): No  Physical Activity: Insufficiently Active   Days of Exercise per Week: 7 days   Minutes of Exercise per Session: 20 min  Stress: No Stress Concern Present   Feeling of Stress : Not at all  Social Connections: Socially Isolated   Frequency of Communication with Friends and Family: More than three times a week   Frequency of Social Gatherings with Friends and Family: More than three times a week   Attends Religious Services: Never   Marine scientist or Organizations: No   Attends Archivist Meetings: Never   Marital Status: Widowed     Family History: The patient's family history includes Cancer in her father; Diabetes in her mother; Heart attack in her mother; Heart disease in her mother and sister. There is no history of Breast cancer or Colon cancer. ROS:   Please see the history of present illness.    All 14 point review of systems negative except as described per history of present illness  EKGs/Labs/Other Studies Reviewed:      Recent Labs: 05/22/2021: ALT 15; BUN 19; Creatinine, Ser 1.09; Hemoglobin 13.1; Platelets 195.0; Potassium 4.6; Sodium 139  Recent  Lipid Panel    Component Value Date/Time   CHOL 234 (H) 03/31/2018 1427   TRIG 89.0 03/31/2018 1427   HDL 58.20 03/31/2018 1427   CHOLHDL 4 03/31/2018 1427   VLDL 17.8 03/31/2018 1427   LDLCALC 158 (H) 03/31/2018 1427    Physical Exam:    VS:  BP 126/84 (BP Location: Right Arm, Patient Position: Sitting)    Pulse 72    Ht 5\' 2"  (1.575 m)    Wt 173 lb (78.5 kg)  SpO2 94%    BMI 31.64 kg/m     Wt Readings from Last 3 Encounters:  05/31/21 173 lb (78.5 kg)  05/22/21 172 lb 12.8 oz (78.4 kg)  04/12/21 169 lb (76.7 kg)     GEN:  Well nourished, well developed in no acute distress HEENT: Normal NECK: No JVD; No carotid bruits LYMPHATICS: No lymphadenopathy CARDIAC: RRR, no murmurs, no rubs, no gallops RESPIRATORY:  Clear to auscultation without rales, wheezing or rhonchi  ABDOMEN: Soft, non-tender, non-distended MUSCULOSKELETAL:  No edema; No deformity  SKIN: Warm and dry LOWER EXTREMITIES: no swelling NEUROLOGIC:  Alert and oriented x 3 PSYCHIATRIC:  Normal affect   ASSESSMENT:    1. Dilated cardiomyopathy ([place])   2. Left bundle branch block   3. Paroxysmal atrial flutter ([place])   4. Mixed hyperlipidemia    PLAN:    In order of problems listed above:  Dilated cardiomyopathy ejection fraction 45-50 she is on Entresto.  After cardioversion we will reevaluate her left ventricle ejection fraction anticipate need to increase the dose of Entresto.  She is not on beta-blocker secondary to the fact she was bradycardic.  She is on amiodarone dose of which has been decreased to only had a milligrams by her primary care physician and cardiologist from [facility].  I am reluctant to increase dose of amiodarone right now because of bradycardia she had previously. Left bundle branch block.  Noted. Mixed dyslipidemia last LDL from K PN is 158.  She is now on statin in form of Lipitor 20 which I will continue I will make arrangements for having fasting lipid profile to be checked. We  did talk about electrical cardioversion including all risk benefits as well as alternatives.  She is willing to proceed.   Medication Adjustments/Labs and Tests Ordered: Current medicines are reviewed at length with the patient today.  Concerns regarding medicines are outlined above.  No orders of the defined types were placed in this encounter.  Medication changes: No orders of the defined types were placed in this encounter.   Signed, Park Liter, MD, [facility] 05/31/2021 2:17 PM    [facility]

## 2021-05-31 NOTE — Telephone Encounter (Signed)
Pt's daughter Manuela Schwartz stated Dr. Erlinda Hong stated to them on her mother's appt he would have Kathlee Nations work her Thursday in for a cortisone injection in her right knee. Please call pt daughter about this matter at [phone].

## 2021-05-31 NOTE — Telephone Encounter (Signed)
Sure that's fine for Thursday morning

## 2021-05-31 NOTE — H&P (View-Only) (Signed)
Cardiology Office Note:    Date:  05/31/2021   ID:  Sheila Schmidt, DOB [birth year], MRN [id number]  PCP:  Sheila Mclean, MD  Cardiologist:  Sheila Campus, MD    Referring MD: Sheila Mclean, MD   Chief Complaint  Patient presents with   Clearance TBD    Dr. Erlinda Hong TKR R side    Results    Monitor and ECHO    History of Present Illness:    Sheila Schmidt is a 85 y.o. female with a past medical history significant for paroxysmal atrial fibrillation/atrial flutter.  Also history of cardiomyopathy.  She is originally from [facility] she relocated to the area about a month ago.  Apparently she lost her husband there and eventually [place].  She was seen by cardiologist at [facility].  She did have cardiomyopathy managed appropriately with Entresto she also got paroxysmal atrial fibrillation with 1 cardioversion done in the summertime.  However when she presented to my office she was in atypical atrial flutter.  I put monitor on her which showed relatively rate controlled atrial flutter which was all the time.  Also she got echocardiogram done which showed ejection fraction 45 to 50% with moderately enlarged left atrium.  She comes today to talk about her issues overall she seems to be doing well except biggest problem is pain in her knees and she still cannot have a knee surgery on the right side.  I told her before we get her rhythm straight I would wait.  We talked at length about potential cardioversion.  She is taking Xarelto without interruption she is getting ready to have cardioversion done and we did explain procedure including all risk benefits as well as alternatives.  Past Medical History:  Diagnosis Date   Cancer ([place])     Left breast carcinoma in situ   Cholelithiasis    Depression    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   Hx of adenomatous colonic polyps    Hyperlipidemia     Past Surgical History:   Procedure Laterality Date   ATRIAL FLUTTER ABLATION     BREAST EXCISIONAL BIOPSY Left 2010   benign   CARDIOVERSION  05/2020   in [place], LAPAROSCOPIC  '06   [place] ARTHROSCOPY     Left '00 [place]/ Right '08   lumpectomy- remote     benign   TOOTH EXTRACTION     TOTAL KNEE ARTHROPLASTY  02/05/2011   left    Current Medications: Current Meds  Medication Sig   acetaminophen-codeine (TYLENOL #3) 300-30 MG tablet Take 1 tablet by mouth every 6 (six) hours as needed for moderate pain.   albuterol (VENTOLIN HFA) 108 (90 Base) MCG/ACT inhaler Inhale 2 puffs into the lungs every 6 (six) hours as needed for wheezing or shortness of breath.   amiodarone (PACERONE) 200 MG tablet Take 100 mg by mouth daily.   atorvastatin (LIPITOR) 20 MG tablet Take 1 tablet (20 mg total) by mouth daily.   Cholecalciferol 1000 UNITS tablet Take 2,000 Units by mouth daily.   famotidine (PEPCID) 40 MG tablet Take 40 mg by mouth as needed for heartburn or indigestion.   FLUoxetine (PROZAC) 10 MG capsule Take 10 mg by mouth daily.   FLUoxetine (PROZAC) 20 MG capsule Take 20 mg by mouth daily.   melatonin 5 MG TABS Take 5 mg  by mouth at bedtime as needed (sleep).   sacubitril-valsartan (ENTRESTO) 24-26 MG Take 1 tablet by mouth 2 (two) times daily.   XARELTO 20 MG TABS tablet TAKE 1 TABLET(20 MG) BY MOUTH DAILY WITH SUPPER (Patient taking differently: Take 20 mg by mouth daily with supper.)     Allergies:   Patient has no known allergies.   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 [employer]. Married '59, 3 daughters- '61, '64, '73; 2 grandchildren. Exercise - goes to gym 5/wk. ACP - directed to the [email or web address]   Lives with her daughter - goes between daughters home in [place] and her own home here   Social Determinants of Health   Financial Resource Strain: Low Risk    Difficulty of Paying Living Expenses: Not hard at all  Food Insecurity: No Food Insecurity   Worried About Charity fundraiser in the Last Year: Never true   Arboriculturist in the Last Year: Never true  Transportation Needs: No Transportation Needs   Lack of Transportation (Medical): No   Lack of Transportation (Non-Medical): No  Physical Activity: Insufficiently Active   Days of Exercise per Week: 7 days   Minutes of Exercise per Session: 20 min  Stress: No Stress Concern Present   Feeling of Stress : Not at all  Social Connections: Socially Isolated   Frequency of Communication with Friends and Family: More than three times a week   Frequency of Social Gatherings with Friends and Family: More than three times a week   Attends Religious Services: Never   Marine scientist or Organizations: No   Attends Archivist Meetings: Never   Marital Status: Widowed     Family History: The patient's family history includes Cancer in her father; Diabetes in her mother; Heart attack in her mother; Heart disease in her mother and sister. There is no history of Breast cancer or Colon cancer. ROS:   Please see the history of present illness.    All 14 point review of systems negative except as described per history of present illness  EKGs/Labs/Other Studies Reviewed:      Recent Labs: 05/22/2021: ALT 15; BUN 19; Creatinine, Ser 1.09; Hemoglobin 13.1; Platelets 195.0; Potassium 4.6; Sodium 139  Recent  Lipid Panel    Component Value Date/Time   CHOL 234 (H) 03/31/2018 1427   TRIG 89.0 03/31/2018 1427   HDL 58.20 03/31/2018 1427   CHOLHDL 4 03/31/2018 1427   VLDL 17.8 03/31/2018 1427   LDLCALC 158 (H) 03/31/2018 1427    Physical Exam:    VS:  BP 126/84 (BP Location: Right Arm, Patient Position: Sitting)    Pulse 72    Ht 5\' 2"  (1.575 m)    Wt 173 lb (78.5 kg)  SpO2 94%    BMI 31.64 kg/m     Wt Readings from Last 3 Encounters:  05/31/21 173 lb (78.5 kg)  05/22/21 172 lb 12.8 oz (78.4 kg)  04/12/21 169 lb (76.7 kg)     GEN:  Well nourished, well developed in no acute distress HEENT: Normal NECK: No JVD; No carotid bruits LYMPHATICS: No lymphadenopathy CARDIAC: RRR, no murmurs, no rubs, no gallops RESPIRATORY:  Clear to auscultation without rales, wheezing or rhonchi  ABDOMEN: Soft, non-tender, non-distended MUSCULOSKELETAL:  No edema; No deformity  SKIN: Warm and dry LOWER EXTREMITIES: no swelling NEUROLOGIC:  Alert and oriented x 3 PSYCHIATRIC:  Normal affect   ASSESSMENT:    1. Dilated cardiomyopathy ([place])   2. Left bundle branch block   3. Paroxysmal atrial flutter ([place])   4. Mixed hyperlipidemia    PLAN:    In order of problems listed above:  Dilated cardiomyopathy ejection fraction 45-50 she is on Entresto.  After cardioversion we will reevaluate her left ventricle ejection fraction anticipate need to increase the dose of Entresto.  She is not on beta-blocker secondary to the fact she was bradycardic.  She is on amiodarone dose of which has been decreased to only had a milligrams by her primary care physician and cardiologist from [facility].  I am reluctant to increase dose of amiodarone right now because of bradycardia she had previously. Left bundle branch block.  Noted. Mixed dyslipidemia last LDL from K PN is 158.  She is now on statin in form of Lipitor 20 which I will continue I will make arrangements for having fasting lipid profile to be checked. We  did talk about electrical cardioversion including all risk benefits as well as alternatives.  She is willing to proceed.   Medication Adjustments/Labs and Tests Ordered: Current medicines are reviewed at length with the patient today.  Concerns regarding medicines are outlined above.  No orders of the defined types were placed in this encounter.  Medication changes: No orders of the defined types were placed in this encounter.   Signed, Park Liter, MD, [facility] 05/31/2021 2:17 PM    [facility]

## 2021-05-31 NOTE — Patient Instructions (Signed)
Medication Instructions:  Your physician recommends that you continue on your current medications as directed. Please refer to the Current Medication list given to you today.  *If you need a refill on your cardiac medications before your next appointment, please call your pharmacy*   Lab Work: Your physician recommends that you have a BMET and CBC 1 week before the procedure. You can come Monday through Friday 8:30 am to 12:00 pm and 1:15 to 4:30. You do not need to make an appointment as the order has already been placed.    If you have labs (blood work) drawn today and your tests are completely normal, you will receive your results only by: [place] (if you have MyChart) OR A paper copy in the mail If you have any lab test that is abnormal or we need to change your treatment, we will call you to review the results.   Testing/Procedures: Your physician has recommended that you have a Cardioversion (DCCV). Electrical Cardioversion uses a jolt of electricity to your heart either through paddles or wired patches attached to your chest. This is a controlled, usually prescheduled, procedure. Defibrillation is done under light anesthesia in the hospital, and you usually go home the day of the procedure. This is done to get your heart back into a normal rhythm. You are not awake for the procedure.   Diagnosis: atrial flutter  DATE AND TIME OF PROCEDURE: 06/21/21 at 10:30  Please register at the Admitting Department at: 9:00  DO NOT EAT OR DRINK ANYTHING after midnight prior to your procedure.  You should take your medications as usual with a sip of water.  If you are diabetic, DO NOT TAKE YOUR DIABETIC MEDICATIONS the morning OF THE PROCEDURE.  DO NOT STOP or miss any dosed of your Eliquis that you are taking. If you miss a dose let us know as soon as possible.  Have your blood drawn as intructed.  You will need someone with you to drive you home after the  procedure.    Follow-Up: At [facility], you and your health needs are our priority.  As part of our continuing mission to provide you with exceptional heart care, we have created designated Provider Care Teams.  These Care Teams include your primary Cardiologist (physician) and Advanced Practice Providers (APPs -  Physician Assistants and Nurse Practitioners) who all work together to provide you with the care you need, when you need it.  We recommend signing up for the patient portal called "MyChart".  Sign up information is provided on this After Visit Summary.  MyChart is used to connect with patients for Virtual Visits (Telemedicine).  Patients are able to view lab/test results, encounter notes, upcoming appointments, etc.  Non-urgent messages can be sent to your provider as well.   To learn more about what you can do with MyChart, go to [email or web address].    Your next appointment:   3 week(s) after Jan. 11,2023.  The format for your next appointment:   In Person  Provider:   Jenne Campus, MD   Other Instructions  Electrical Cardioversion Electrical cardioversion is the delivery of a jolt of electricity to restore a normal rhythm to the heart. A rhythm that is too fast or is not regular keeps the heart from pumping well. In this procedure, sticky patches or metal paddles are placed on the chest to deliver electricity to the heart from a device. This procedure may be done in an emergency if: There is low or no  blood pressure as a result of the heart rhythm. Normal rhythm must be restored as fast as possible to protect the brain and heart from further damage. It may save a life. This may also be a scheduled procedure for irregular or fast heart rhythms that are not immediately life-threatening. Tell a health care provider about: Any allergies you have. All medicines you are taking, including vitamins, herbs, eye drops, creams, and over-the-counter medicines. Any  problems you or family members have had with anesthetic medicines. Any blood disorders you have. Any surgeries you have had. Any medical conditions you have. Whether you are pregnant or may be pregnant. What are the risks? Generally, this is a safe procedure. However, problems may occur, including: Allergic reactions to medicines. A blood clot that breaks free and travels to other parts of your body. The possible return of an abnormal heart rhythm within hours or days after the procedure. Your heart stopping (cardiac arrest). This is rare. What happens before the procedure? Medicines Your health care provider may have you start taking: Blood-thinning medicines (anticoagulants) so your blood does not clot as easily. Medicines to help stabilize your heart rate and rhythm. Ask your health care provider about: Changing or stopping your regular medicines. This is especially important if you are taking diabetes medicines or blood thinners. Taking medicines such as aspirin and ibuprofen. These medicines can thin your blood. Do not take these medicines unless your health care provider tells you to take them. Taking over-the-counter medicines, vitamins, herbs, and supplements. General instructions Follow instructions from your health care provider about eating or drinking restrictions. Plan to have someone take you home from the hospital or clinic. If you will be going home right after the procedure, plan to have someone with you for 24 hours. Ask your health care provider what steps will be taken to help prevent infection. These may include washing your skin with a germ-killing soap. What happens during the procedure? An IV will be inserted into one of your veins. Sticky patches (electrodes) or metal paddles may be placed on your chest. You will be given a medicine to help you relax (sedative). An electrical shock will be delivered. The procedure may vary among health care providers and  hospitals.    What can I expect after the procedure? Your blood pressure, heart rate, breathing rate, and blood oxygen level will be monitored until you leave the hospital or clinic. Your heart rhythm will be watched to make sure it does not change. You may have some redness on the skin where the shocks were given. Follow these instructions at home: Do not drive for 24 hours if you were given a sedative during your procedure. Take over-the-counter and prescription medicines only as told by your health care provider. Ask your health care provider how to check your pulse. Check it often. Rest for 48 hours after the procedure or as told by your health care provider. Avoid or limit your caffeine use as told by your health care provider. Keep all follow-up visits as told by your health care provider. This is important. Contact a health care provider if: You feel like your heart is beating too quickly or your pulse is not regular. You have a serious muscle cramp that does not go away. Get help right away if: You have discomfort in your chest. You are dizzy or you feel faint. You have trouble breathing or you are short of breath. Your speech is slurred. You have trouble moving an arm or  leg on one side of your body. Your fingers or toes turn cold or blue. Summary Electrical cardioversion is the delivery of a jolt of electricity to restore a normal rhythm to the heart. This procedure may be done right away in an emergency or may be a scheduled procedure if the condition is not an emergency. Generally, this is a safe procedure. After the procedure, check your pulse often as told by your health care provider. This information is not intended to replace advice given to you by your health care provider. Make sure you discuss any questions you have with your health care provider. Document Revised: 12/29/2018 Document Reviewed: 12/29/2018 Elsevier Patient Education  [place].

## 2021-06-01 ENCOUNTER — Telehealth: Payer: Self-pay | Admitting: Orthopaedic Surgery

## 2021-06-01 NOTE — Telephone Encounter (Signed)
yes

## 2021-06-01 NOTE — Progress Notes (Signed)
Office Visit Note   Patient: Sheila Schmidt           Date of Birth: 04-Dec-1935           MRN: [id number] Visit Date: 05/30/2021              Requested by: Darreld Mclean, MD [address] [place],  [place] [address] PCP: Darreld Mclean, MD   Assessment & Plan: Visit Diagnoses:  1. Primary osteoarthritis of right knee     Plan: Impression is end-stage right knee degenerative joint disease.  At this point she no longer receives enough relief from conservative management and she has decided that she wants to pursue a right total knee replacement.  Risk benefits rehab recovery reviewed with the patient in detail.  My surgery scheduler Jackelyn Poling will call the patient as soon as we have the necessary preoperative clearances.  She decided to hold off on cortisone injection today.  Follow-Up Instructions: No follow-ups on file.   Orders:  Orders Placed This Encounter  Procedures   XR KNEE 3 VIEW RIGHT   No orders of the defined types were placed in this encounter.     Procedures: No procedures performed   Clinical Data: No additional findings.   Subjective: Chief Complaint  Patient presents with   Right Knee - Pain    Ms. Herrero is a very pleasant 85 year old female previous patient of Dr. Durward Fortes who comes in today for evaluation and follow-up of chronic pain of the right knee.  She has undergone years of conservative management with Dr. Durward Fortes.  She feels that she has gotten to the point where she could no longer manage the pain.  She is very limited by this and she has no quality of life.  She has had some issues with her heart in the last year and she is currently on Xarelto for atrial fibrillation.   Review of Systems  Constitutional: Negative.   HENT: Negative.    Eyes: Negative.   Respiratory: Negative.    Cardiovascular: Negative.   Endocrine: Negative.   Musculoskeletal: Negative.   Neurological: Negative.   Hematological:  Negative.   Psychiatric/Behavioral: Negative.    All other systems reviewed and are negative.   Objective: Vital Signs: There were no vitals taken for this visit.  Physical Exam Vitals and nursing note reviewed.  Constitutional:      Appearance: She is well-developed.  Pulmonary:     Effort: Pulmonary effort is normal.  Skin:    General: Skin is warm.     Capillary Refill: Capillary refill takes less than 2 seconds.  Neurological:     Mental Status: She is alert and oriented to person, place, and time.  Psychiatric:        Behavior: Behavior normal.        Thought Content: Thought content normal.        Judgment: Judgment normal.    Ortho Exam  Right knee shows 2+ patellofemoral crepitus with range of motion.  Collaterals and cruciates are stable.  Trace effusion.  Range of motion limited by pain.  Specialty Comments:  No specialty comments available.  Imaging: No results found.   PMFS History: Patient Active Problem List   Diagnosis Date Noted   Cancer ([place]) 05/25/2021   Left bundle branch block 04/12/2021   Dilated cardiomyopathy ([place]) 04/12/2021   Vasculitis ([place]) 02/22/2021   Anxiety state 02/12/2021   Body fluid retention 02/12/2021   Cervical intraepithelial  neoplasia 02/12/2021   Insomnia 02/12/2021   Pruritus of vulva 02/12/2021   Leg wound, right 10/03/2020   Bilateral primary osteoarthritis of knee 03/30/2019   Paroxysmal atrial fibrillation ([facility]) 04/07/2015   Chronic anticoagulation 04/07/2015   Paroxysmal atrial flutter ([place]) 03/16/2015   Essential hypertension, benign 02/24/2014   Sciatica 12/08/2013   Hyperlipidemia 09/25/2013   Osteopenia 11/19/2012   Low back pain 01/26/2010   Carcinoma in situ of breast 09/04/2009   DEPRESSION, PROLONGED 09/04/2009   DEGENERATIVE JOINT DISEASE, KNEES, BILATERAL 09/04/2009   PERIPHERAL EDEMA 09/04/2009   COLONIC POLYPS, ADENOMATOUS, HX OF 09/04/2009   Malignant tumor of breast ([place]) 06/11/2009   Past  Medical History:  Diagnosis Date   Cancer ([place])     Left breast carcinoma in situ   Cholelithiasis    Depression    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   Hx of adenomatous colonic polyps    Hyperlipidemia     Family History  Problem Relation Age of Onset   Diabetes Mother        Deceased in 21s   Heart disease Mother        cad/MI-fatal   Heart attack Mother    Cancer Father        liver, Deceased in 4s   Heart disease Sister    Breast cancer Neg Hx    Colon cancer Neg Hx     Past Surgical History:  Procedure Laterality Date   ATRIAL FLUTTER ABLATION     BREAST EXCISIONAL BIOPSY Left 2010   benign   CARDIOVERSION  05/2020   in [place], LAPAROSCOPIC  '06   [place] ARTHROSCOPY     Left '00 [place]/ Right '08   lumpectomy- remote     benign   TOOTH EXTRACTION     TOTAL KNEE ARTHROPLASTY  02/05/2011   left   Social History   Occupational History   Occupation: retired     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

## 2021-06-01 NOTE — Telephone Encounter (Signed)
Pt wondering if she can be worked in on 12/28 for a cortisone injection in her right knee.   CB [phone]

## 2021-06-01 NOTE — Telephone Encounter (Signed)
Called no answer

## 2021-06-02 NOTE — Telephone Encounter (Signed)
Appt made

## 2021-06-07 ENCOUNTER — Ambulatory Visit (INDEPENDENT_AMBULATORY_CARE_PROVIDER_SITE_OTHER): Payer: Medicare Other | Admitting: Orthopaedic Surgery

## 2021-06-07 ENCOUNTER — Other Ambulatory Visit: Payer: Self-pay

## 2021-06-07 ENCOUNTER — Encounter: Payer: Self-pay | Admitting: Orthopaedic Surgery

## 2021-06-07 DIAGNOSIS — M1711 Unilateral primary osteoarthritis, right knee: Secondary | ICD-10-CM | POA: Diagnosis not present

## 2021-06-07 NOTE — Progress Notes (Signed)
Office Visit Note   Patient: Sheila Schmidt           Date of Birth: 03-11-1936           MRN: [id number] Visit Date: 06/07/2021              Requested by: Sheila Mclean, MD [facility] [address] [place],  [place] [id number] PCP: Sheila Mclean, MD   Assessment & Plan: Visit Diagnoses:  1. Unilateral primary osteoarthritis, right knee     Plan: Impression is right knee osteoarthritis.  Based on treatment options, patient would like to repeat cortisone injection today.  She is scheduled to undergo cardioversion for [facility] January 11 and will need to wait a few months after before proceeding with total knee arthroplasty.  She will let us know when she is ready for surgery.  Call with concerns or questions in the meantime.  Follow-Up Instructions: Return if symptoms worsen or fail to improve.   Orders:  Orders Placed This Encounter  Procedures   Large Joint Inj: R knee   No orders of the defined types were placed in this encounter.     Procedures: Large Joint Inj: R knee on 06/07/2021 9:01 AM Indications: pain Details: 22 G needle, anterolateral approach Medications: 2 mL lidocaine 1 %; 2 mL bupivacaine 0.25 %; 40 mg methylPREDNISolone acetate 40 MG/ML     Clinical Data: No additional findings.   Subjective: Chief Complaint  Patient presents with   Right Knee - Follow-up    HPI patient is a pleasant 85 year old female previous patient of Dr. Rudene Schmidt who comes in today with right knee pain.  History of underlying degenerative joint disease.  When she was seen by [place] recently, she was interested in proceeding with total knee arthroplasty, however she has been seen by her cardiologist who has recommended cardioversion for her [facility] which is scheduled for June 21, 2021.  She has been told that she will need to wait a few months after before proceeding with knee replacement surgery.  She is here today requesting a right knee cortisone injection to help  her with the pain in the interim as the Tylenol she is currently taking does not seem to help.  Review of Systems as detailed in HPI.  All others reviewed and are negative.   Objective: Vital Signs: There were no vitals taken for this visit.  Physical Exam well-developed well-nourished female no acute distress.  Alert and oriented x3.  Ortho Exam right knee exam shows a trace effusion.  Range of motion 0 to 90 degrees.  Moderate full femoral crepitus.  Medial joint line tenderness.  Ligaments are stable.  She is neurovascular intact distally.  Specialty Comments:  No specialty comments available.  Imaging: No new imaging   PMFS History: Patient Active Problem List   Diagnosis Date Noted   Cancer ([place]) 05/25/2021   Left bundle branch block 04/12/2021   Dilated cardiomyopathy ([place]) 04/12/2021   Vasculitis ([place]) 02/22/2021   Anxiety state 02/12/2021   Body fluid retention 02/12/2021   Cervical intraepithelial neoplasia 02/12/2021   Insomnia 02/12/2021   Pruritus of vulva 02/12/2021   Leg wound, right 10/03/2020   Bilateral primary osteoarthritis of knee 03/30/2019   Paroxysmal atrial fibrillation ([place]) 04/07/2015   Chronic anticoagulation 04/07/2015   Paroxysmal atrial flutter ([place]) 03/16/2015   Essential hypertension, benign 02/24/2014   Sciatica 12/08/2013   Hyperlipidemia 09/25/2013   Osteopenia 11/19/2012   Low back pain 01/26/2010  Carcinoma in situ of breast 09/04/2009   DEPRESSION, PROLONGED 09/04/2009   DEGENERATIVE JOINT DISEASE, KNEES, BILATERAL 09/04/2009   PERIPHERAL EDEMA 09/04/2009   COLONIC POLYPS, ADENOMATOUS, HX OF 09/04/2009   Malignant tumor of breast ([place]) 06/11/2009   Past Medical History:  Diagnosis Date   Cancer ([place])     Left breast carcinoma in situ   Cholelithiasis    Depression    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   Hx of adenomatous colonic polyps    Hyperlipidemia     Family History  Problem Relation Age of  Onset   Diabetes Mother        Deceased in 47s   Heart disease Mother        cad/MI-fatal   Heart attack Mother    Cancer Father        liver, Deceased in 63s   Heart disease Sister    Breast cancer Neg Hx    Colon cancer Neg Hx     Past Surgical History:  Procedure Laterality Date   ATRIAL FLUTTER ABLATION     BREAST EXCISIONAL BIOPSY Left 2010   benign   CARDIOVERSION  05/2020   in [place], LAPAROSCOPIC  '06   [place] ARTHROSCOPY     Left '00 [place]/ Right '08   lumpectomy- remote     benign   TOOTH EXTRACTION     TOTAL KNEE ARTHROPLASTY  02/05/2011   left   Social History   Occupational History   Occupation: retired     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

## 2021-06-09 ENCOUNTER — Encounter (HOSPITAL_COMMUNITY): Payer: Self-pay | Admitting: Cardiovascular Disease

## 2021-06-09 MED ORDER — LIDOCAINE HCL 1 % IJ SOLN
2.0000 mL | INTRAMUSCULAR | Status: AC | PRN
  Administered 2021-06-07: 09:00:00 2 mL

## 2021-06-09 MED ORDER — METHYLPREDNISOLONE ACETATE 40 MG/ML IJ SUSP
40.0000 mg | INTRAMUSCULAR | Status: AC | PRN
  Administered 2021-06-07: 09:00:00 40 mg via INTRA_ARTICULAR

## 2021-06-09 MED ORDER — BUPIVACAINE HCL 0.25 % IJ SOLN
2.0000 mL | INTRAMUSCULAR | Status: AC | PRN
  Administered 2021-06-07: 09:00:00 2 mL via INTRA_ARTICULAR

## 2021-06-09 NOTE — Progress Notes (Signed)
Attempted to obtain medical history via telephone, unable to reach at this time. I left a voicemail to return pre surgical testing department's phone call.  

## 2021-06-12 ENCOUNTER — Encounter: Payer: Self-pay | Admitting: Family Medicine

## 2021-06-12 DIAGNOSIS — R0602 Shortness of breath: Secondary | ICD-10-CM

## 2021-06-13 NOTE — Addendum Note (Signed)
Addended by: Lamar Blinks C on: 06/13/2021 12:17 PM   Modules accepted: Orders

## 2021-06-15 LAB — CBC WITH DIFFERENTIAL/PLATELET
Basophils Absolute: 0.1 10*3/uL (ref 0.0–0.2)
Basos: 1 %
EOS (ABSOLUTE): 0.1 10*3/uL (ref 0.0–0.4)
Eos: 1 %
Hematocrit: 40.2 % (ref 34.0–46.6)
Hemoglobin: 14.3 g/dL (ref 11.1–15.9)
Immature Grans (Abs): 0 10*3/uL (ref 0.0–0.1)
Immature Granulocytes: 0 %
Lymphocytes Absolute: 4.2 10*3/uL — ABNORMAL HIGH (ref 0.7–3.1)
Lymphs: 39 %
MCH: 34.8 pg — ABNORMAL HIGH (ref 26.6–33.0)
MCHC: 35.6 g/dL (ref 31.5–35.7)
MCV: 98 fL — ABNORMAL HIGH (ref 79–97)
Monocytes Absolute: 0.8 10*3/uL (ref 0.1–0.9)
Monocytes: 7 %
Neutrophils Absolute: 5.4 10*3/uL (ref 1.4–7.0)
Neutrophils: 52 %
Platelets: 257 10*3/uL (ref 150–450)
RBC: 4.11 x10E6/uL (ref 3.77–5.28)
RDW: 11.9 % (ref 11.7–15.4)
WBC: 10.6 10*3/uL (ref 3.4–10.8)

## 2021-06-15 LAB — BASIC METABOLIC PANEL
BUN/Creatinine Ratio: 23 (ref 12–28)
BUN: 24 mg/dL (ref 8–27)
CO2: 23 mmol/L (ref 20–29)
Calcium: 9.8 mg/dL (ref 8.7–10.3)
Chloride: 102 mmol/L (ref 96–106)
Creatinine, Ser: 1.03 mg/dL — ABNORMAL HIGH (ref 0.57–1.00)
Glucose: 90 mg/dL (ref 70–99)
Potassium: 4.5 mmol/L (ref 3.5–5.2)
Sodium: 142 mmol/L (ref 134–144)
eGFR: 54 mL/min/{1.73_m2} — ABNORMAL LOW (ref >59)

## 2021-06-19 ENCOUNTER — Institutional Professional Consult (permissible substitution): Payer: Medicare Other | Admitting: Pulmonary Disease

## 2021-06-20 ENCOUNTER — Encounter: Payer: Self-pay | Admitting: Family Medicine

## 2021-06-21 ENCOUNTER — Ambulatory Visit (HOSPITAL_COMMUNITY)
Admission: RE | Admit: 2021-06-21 | Discharge: 2021-06-21 | Disposition: A | Discharge status: Home / Self Care | Payer: Medicare Other | Attending: Cardiovascular Disease | Admitting: Cardiovascular Disease

## 2021-06-21 ENCOUNTER — Other Ambulatory Visit: Payer: Self-pay

## 2021-06-21 ENCOUNTER — Ambulatory Visit (HOSPITAL_COMMUNITY): Payer: Medicare Other | Admitting: Certified Registered Nurse Anesthetist

## 2021-06-21 ENCOUNTER — Encounter (HOSPITAL_COMMUNITY)
Admission: RE | Disposition: A | Discharge status: Home / Self Care | Payer: Self-pay | Source: Home / Self Care | Attending: Cardiovascular Disease

## 2021-06-21 ENCOUNTER — Encounter (HOSPITAL_COMMUNITY): Payer: Self-pay | Admitting: Cardiovascular Disease

## 2021-06-21 DIAGNOSIS — I480 Paroxysmal atrial fibrillation: Secondary | ICD-10-CM | POA: Diagnosis not present

## 2021-06-21 DIAGNOSIS — I484 Atypical atrial flutter: Secondary | ICD-10-CM | POA: Insufficient documentation

## 2021-06-21 DIAGNOSIS — I420 Dilated cardiomyopathy: Secondary | ICD-10-CM | POA: Insufficient documentation

## 2021-06-21 DIAGNOSIS — I4891 Unspecified atrial fibrillation: Secondary | ICD-10-CM | POA: Diagnosis not present

## 2021-06-21 DIAGNOSIS — Z79899 Other long term (current) drug therapy: Secondary | ICD-10-CM | POA: Insufficient documentation

## 2021-06-21 DIAGNOSIS — E782 Mixed hyperlipidemia: Secondary | ICD-10-CM | POA: Insufficient documentation

## 2021-06-21 DIAGNOSIS — I447 Left bundle-branch block, unspecified: Secondary | ICD-10-CM | POA: Insufficient documentation

## 2021-06-21 HISTORY — PX: CARDIOVERSION: SHX1299

## 2021-06-21 SURGERY — CARDIOVERSION
Anesthesia: General

## 2021-06-21 MED ORDER — PROPOFOL 10 MG/ML IV BOLUS
INTRAVENOUS | Status: DC | PRN
  Administered 2021-06-21: 100 mg via INTRAVENOUS

## 2021-06-21 MED ORDER — SODIUM CHLORIDE 0.9 % IV SOLN
INTRAVENOUS | Status: AC | PRN
  Administered 2021-06-21: 500 mL via INTRAVENOUS

## 2021-06-21 MED ORDER — LIDOCAINE 2% (20 MG/ML) 5 ML SYRINGE
INTRAMUSCULAR | Status: DC | PRN
  Administered 2021-06-21: 20 mg via INTRAVENOUS

## 2021-06-21 MED ORDER — SODIUM CHLORIDE 0.9 % IV SOLN: INTRAVENOUS | Status: DC

## 2021-06-21 NOTE — CV Procedure (Signed)
° °  DIRECT CURRENT CARDIOVERSION  NAME:  Sheila Schmidt    MRN: [id number] DOB:  [birth year]    ADMIT DATE: 06/21/2021  Indication:  Symptomatic atrial fibrillation   Procedure Note:  The patient signed informed consent.  They have had had therapeutic anticoagulation with xarelto greater than 3 weeks.  Anesthesia was administered by Dr. Ola Spurr.  Adequate airway was maintained throughout and vital followed per protocol.  They were cardioverted x 1 with 200J of biphasic synchronized energy.  They converted to NSR.  There were no apparent complications.  The patient had normal neuro status and respiratory status post procedure with vitals stable as recorded elsewhere.    Follow up: They will continue on current medical therapy and follow up with cardiology as scheduled.  [place]ake Bells T. Audie Box, MD, [place]  [address], [place], [place] [address] [phone]  10:31 AM

## 2021-06-21 NOTE — Anesthesia Procedure Notes (Signed)
Procedure Name: General with mask airway Date/Time: 06/21/2021 10:28 AM Performed by: Lowella Dell, CRNA Pre-anesthesia Checklist: Patient identified, Emergency Drugs available, Suction available, Patient being monitored and Timeout performed Patient Re-evaluated:Patient Re-evaluated prior to induction Oxygen Delivery Method: Ambu bag Preoxygenation: Pre-oxygenation with 100% oxygen Induction Type: IV induction Ventilation: Mask ventilation without difficulty Placement Confirmation: positive ETCO2 Dental Injury: Teeth and Oropharynx as per pre-operative assessment

## 2021-06-21 NOTE — Anesthesia Postprocedure Evaluation (Signed)
Anesthesia Post Note  Patient: Sheila Schmidt  Procedure(s) Performed: CARDIOVERSION     Patient location during evaluation: PACU Anesthesia Type: General Level of consciousness: awake and alert Pain management: pain level controlled Vital Signs Assessment: post-procedure vital signs reviewed and stable Respiratory status: spontaneous breathing, nonlabored ventilation, respiratory function stable and patient connected to nasal cannula oxygen Cardiovascular status: blood pressure returned to baseline and stable Postop Assessment: no apparent nausea or vomiting Anesthetic complications: no   No notable events documented.  Last Vitals:  Vitals:   06/21/21 1045 06/21/21 1055  BP: 91/60 (!) 107/58  Pulse: (!) 48 (!) 54  Resp: 17 14  Temp:    SpO2: 100% 98%    Last Pain:  Vitals:   06/21/21 1055  TempSrc:   PainSc: 0-No pain                 Tiajuana Amass

## 2021-06-21 NOTE — Anesthesia Preprocedure Evaluation (Signed)
Anesthesia Evaluation  Patient identified by MRN, date of birth, ID band Patient awake    Reviewed: Allergy & Precautions, NPO status , Patient's Chart, lab work & pertinent test results  Airway Mallampati: II  TM Distance: >3 FB Neck ROM: Full    Dental  (+) Dental Advisory Given   Pulmonary neg pulmonary ROS,    breath sounds clear to auscultation       Cardiovascular hypertension, Pt. on medications + dysrhythmias Atrial Fibrillation  Rhythm:Irregular Rate:Normal     Neuro/Psych  Neuromuscular disease    GI/Hepatic negative GI ROS, Neg liver ROS,   Endo/Other  negative endocrine ROS  Renal/GU negative Renal ROS     Musculoskeletal  (+) Arthritis ,   Abdominal   Peds  Hematology negative hematology ROS (+)   Anesthesia Other Findings   Reproductive/Obstetrics                             Anesthesia Physical Anesthesia Plan  ASA: 2  Anesthesia Plan: General   Post-op Pain Management:    Induction: Intravenous  PONV Risk Score and Plan: Treatment may vary due to age or medical condition  Airway Management Planned: Natural Airway and Mask  Additional Equipment:   Intra-op Plan:   Post-operative Plan: Extubation in OR  Informed Consent: I have reviewed the patients History and Physical, chart, labs and discussed the procedure including the risks, benefits and alternatives for the proposed anesthesia with the patient or authorized representative who has indicated his/her understanding and acceptance.       Plan Discussed with:   Anesthesia Plan Comments:         Anesthesia Quick Evaluation

## 2021-06-21 NOTE — Transfer of Care (Signed)
Immediate Anesthesia Transfer of Care Note  Patient: Sheila Schmidt  Procedure(s) Performed: CARDIOVERSION  Patient Location: PACU and Endoscopy Unit  Anesthesia Type:General  Level of Consciousness: drowsy and patient cooperative  Airway & Oxygen Therapy: Patient Spontanous Breathing and Patient connected to nasal cannula oxygen  Post-op Assessment: Report given to RN and Post -op Vital signs reviewed and stable  Post vital signs: Reviewed and stable  Last Vitals:  Vitals Value Taken Time  BP 93/53   Temp    Pulse 57   Resp 19   SpO2 100     Last Pain:  Vitals:   06/21/21 0912  TempSrc: Temporal  PainSc: 0-No pain         Complications: No notable events documented.

## 2021-06-21 NOTE — Discharge Instructions (Signed)

## 2021-06-21 NOTE — Interval H&P Note (Signed)
History and Physical Interval Note:  06/21/2021 9:05 AM  Sheila Schmidt  has presented today for surgery, with the diagnosis of AFLUTTER.  The various methods of treatment have been discussed with the patient and family. After consideration of risks, benefits and other options for treatment, the patient has consented to  Procedure(s): CARDIOVERSION (N/A) as a surgical intervention.  The patient's history has been reviewed, patient examined, no change in status, stable for surgery.  I have reviewed the patient's chart and labs.  Questions were answered to the patient's satisfaction.    NPO for DCCV. On xarelto. No missed doses >3 weeks.   Lake Bells T. Audie Box, MD, [place]  [address] [place], [place] [address] [phone]  9:06 AM

## 2021-06-23 ENCOUNTER — Encounter (HOSPITAL_COMMUNITY): Payer: Self-pay | Admitting: Cardiovascular Disease

## 2021-07-05 ENCOUNTER — Encounter: Payer: Self-pay | Admitting: Family Medicine

## 2021-07-05 DIAGNOSIS — I480 Paroxysmal atrial fibrillation: Secondary | ICD-10-CM

## 2021-07-05 DIAGNOSIS — E785 Hyperlipidemia, unspecified: Secondary | ICD-10-CM

## 2021-07-05 MED ORDER — ATORVASTATIN CALCIUM 20 MG PO TABS: 20.0000 mg | ORAL_TABLET | Freq: Every day | ORAL | 0 refills | Status: DC

## 2021-07-05 MED ORDER — RIVAROXABAN 20 MG PO TABS: 20.0000 mg | ORAL_TABLET | Freq: Every day | ORAL | 3 refills | Status: DC

## 2021-07-05 MED ORDER — ATORVASTATIN CALCIUM 20 MG PO TABS: 20.0000 mg | ORAL_TABLET | Freq: Every day | ORAL | 3 refills | Status: DC

## 2021-07-09 ENCOUNTER — Other Ambulatory Visit: Payer: Self-pay | Admitting: Family Medicine

## 2021-07-09 DIAGNOSIS — I480 Paroxysmal atrial fibrillation: Secondary | ICD-10-CM

## 2021-07-09 DIAGNOSIS — E785 Hyperlipidemia, unspecified: Secondary | ICD-10-CM

## 2021-07-09 MED ORDER — RIVAROXABAN 20 MG PO TABS: 20.0000 mg | ORAL_TABLET | Freq: Every day | ORAL | 3 refills | Status: DC

## 2021-07-09 MED ORDER — ATORVASTATIN CALCIUM 20 MG PO TABS: 20.0000 mg | ORAL_TABLET | Freq: Every day | ORAL | 3 refills | Status: DC

## 2021-07-10 ENCOUNTER — Ambulatory Visit (HOSPITAL_BASED_OUTPATIENT_CLINIC_OR_DEPARTMENT_OTHER)
Admission: RE | Admit: 2021-07-10 | Discharge: 2021-07-10 | Disposition: A | Discharge status: Home / Self Care | Payer: Medicare Other | Source: Ambulatory Visit | Attending: Family Medicine | Admitting: Family Medicine

## 2021-07-10 ENCOUNTER — Encounter (HOSPITAL_BASED_OUTPATIENT_CLINIC_OR_DEPARTMENT_OTHER): Payer: Self-pay

## 2021-07-10 ENCOUNTER — Other Ambulatory Visit: Payer: Self-pay

## 2021-07-10 DIAGNOSIS — Z1231 Encounter for screening mammogram for malignant neoplasm of breast: Secondary | ICD-10-CM | POA: Insufficient documentation

## 2021-07-12 ENCOUNTER — Other Ambulatory Visit: Payer: Self-pay

## 2021-07-12 ENCOUNTER — Encounter: Payer: Self-pay | Admitting: Cardiology

## 2021-07-12 ENCOUNTER — Ambulatory Visit (INDEPENDENT_AMBULATORY_CARE_PROVIDER_SITE_OTHER): Payer: Medicare Other | Admitting: Cardiology

## 2021-07-12 VITALS — BP 144/90 | HR 47 | Ht 62.0 in | Wt 174.0 lb

## 2021-07-12 DIAGNOSIS — E782 Mixed hyperlipidemia: Secondary | ICD-10-CM | POA: Diagnosis not present

## 2021-07-12 DIAGNOSIS — I420 Dilated cardiomyopathy: Secondary | ICD-10-CM | POA: Diagnosis not present

## 2021-07-12 DIAGNOSIS — I4892 Unspecified atrial flutter: Secondary | ICD-10-CM

## 2021-07-12 DIAGNOSIS — I447 Left bundle-branch block, unspecified: Secondary | ICD-10-CM | POA: Diagnosis not present

## 2021-07-12 NOTE — Patient Instructions (Signed)
Medication Instructions:  Your physician has recommended you make the following change in your medication:   STOP: Amiodarone  *If you need a refill on your cardiac medications before your next appointment, please call your pharmacy*   Lab Work: None If you have labs (blood work) drawn today and your tests are completely normal, you will receive your results only by: [place] (if you have MyChart) OR A paper copy in the mail If you have any lab test that is abnormal or we need to change your treatment, we will call you to review the results.   Testing/Procedures: A zio monitor was ordered today. It will remain on for 14 days. You will then return monitor and event diary in provided box. It takes 1-2 weeks for report to be downloaded and returned to [place]. We will call you with the results. If monitor falls off or has orange flashing light, please call Zio for further instructions.     Follow-Up: At [facility], you and your health needs are our priority.  As part of our continuing mission to provide you with exceptional heart care, we have created designated Provider Care Teams.  These Care Teams include your primary Cardiologist (physician) and Advanced Practice Providers (APPs -  Physician Assistants and Nurse Practitioners) who all work together to provide you with the care you need, when you need it.  We recommend signing up for the patient portal called "MyChart".  Sign up information is provided on this After Visit Summary.  MyChart is used to connect with patients for Virtual Visits (Telemedicine).  Patients are able to view lab/test results, encounter notes, upcoming appointments, etc.  Non-urgent messages can be sent to your provider as well.   To learn more about what you can do with MyChart, go to [email or web address].    Your next appointment:   2 month(s)  The format for your next appointment:   In Person  Provider:   Jenne Campus, MD    Other  Instructions None

## 2021-07-12 NOTE — Progress Notes (Signed)
Cardiology Office Note:    Date:  07/12/2021   ID:  Sheila Schmidt, DOB Oct 21, 1935, MRN [id number]  PCP:  Darreld Mclean, MD  Cardiologist:  Jenne Campus, MD    Referring MD: Darreld Mclean, MD   No chief complaint on file. I am not doing well I am tired  History of Present Illness:    Sheila Schmidt is a 86 y.o. female   with a past medical history significant for paroxysmal atrial fibrillation/atrial flutter.  Also history of cardiomyopathy.  She is originally from [place], [place] she relocated to the area about a month ago.  Apparently she lost her husband there and eventually decided [place].  She was seen by cardiologist at [facility].  She did have cardiomyopathy managed appropriately with Entresto she also got paroxysmal atrial fibrillation with 1 cardioversion done in the summertime.  However when she presented to my office she was in atypical atrial flutter.  I put monitor on her which showed relatively rate controlled atrial flutter which was all the time.  Also she got echocardiogram done which showed ejection fraction 45 to 50% with moderately enlarged left atrium. She eventually end up having cardioversion done she received 200 J and converted to sinus rhythm actually sinus bradycardia first-degree AV block she comes today to my office for follow-up.  She complains of not feeling well she is exhausted and tired 1 episode of dizziness but no passing out.  She tried to exercise on the regular basis and now when she tried to go on the treadmill or stationary bike she will get short of breath.  She tried to push through the symptomatology but few times she have to stop it.  Her EKG today shows sinus bradycardia most likely junctional escape rhythm 47.  I am concerned about the fact that her symptomatology is related to significant bradycardia.  Past Medical History:  Diagnosis Date   Cancer ([place])     Left breast carcinoma in situ    Cholelithiasis    Depression    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   Hx of adenomatous colonic polyps    Hyperlipidemia     Past Surgical History:  Procedure Laterality Date   ATRIAL FLUTTER ABLATION     BREAST EXCISIONAL BIOPSY Left 2010   benign   [facility]   CARDIOVERSION N/A 06/21/2021   Procedure: CARDIOVERSION;  Surgeon: Geralynn Rile, MD;  Location: [facility];  Service: Cardiovascular;  Laterality: N/A;   CHOLECYSTECTOMY     CHOLECYSTECTOMY, LAPAROSCOPIC  '06   [place] ARTHROSCOPY     Left '00 [place]/ Right '08   lumpectomy- remote     benign   TOOTH EXTRACTION     TOTAL KNEE ARTHROPLASTY  02/05/2011   left    Current Medications: Current Meds  Medication Sig   acetaminophen-codeine (TYLENOL #3) 300-30 MG tablet Take 1 tablet by mouth every 6 (six) hours as needed for moderate pain.   albuterol (VENTOLIN HFA) 108 (90 Base) MCG/ACT inhaler Inhale 2 puffs into the lungs every 6 (six) hours as needed for wheezing or shortness of breath.   amiodarone (PACERONE) 200 MG tablet Take 200 mg by mouth daily. Take 0.5 tablet (100 mg) daily   atorvastatin (LIPITOR) 20 MG tablet Take 1 tablet (20 mg total) by mouth daily.   Cholecalciferol 1000 UNITS tablet Take 1,000 Units by mouth daily.  famotidine (PEPCID) 40 MG tablet Take 40 mg by mouth as needed for heartburn or indigestion.   FLUoxetine (PROZAC) 20 MG capsule Take 20 mg by mouth daily.   melatonin 5 MG TABS Take 5 mg by mouth at bedtime.   rivaroxaban (XARELTO) 20 MG TABS tablet Take 1 tablet (20 mg total) by mouth daily with supper.   sacubitril-valsartan (ENTRESTO) 24-26 MG Take 1 tablet by mouth 2 (two) times daily.     Allergies:   Patient has no known allergies.   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. Married '59, 3 daughters- '61, '64, '73; 2 grandchildren. Exercise - goes to gym 5/wk. ACP - directed to the [email or web address]   Lives with her daughter - goes between daughters home in [place] and her own home here   Social Determinants of Health   Financial Resource Strain: Low Risk    Difficulty of Paying Living Expenses: Not hard at all  Food Insecurity: No Food Insecurity   Worried About Charity fundraiser in the Last Year: Never true   Arboriculturist in the Last Year: Never true  Transportation Needs: No Transportation Needs   Lack of Transportation (Medical): No   Lack of Transportation (Non-Medical): No  Physical Activity: Insufficiently Active   Days of Exercise per Week: 7 days   Minutes of Exercise per Session: 20 min  Stress: No Stress Concern Present   Feeling of Stress : Not at all  Social Connections: Socially Isolated   Frequency of Communication with Friends and Family: More than three times a week   Frequency of Social Gatherings with Friends and Family: More than three times a week   Attends Religious Services: Never   Marine scientist or Organizations: No   Attends Archivist Meetings: Never   Marital Status: Widowed     Family History: The patient's family history includes Cancer in her father; Diabetes in her mother; Heart attack in her mother; Heart disease in her mother and sister. There is no history of Breast cancer or Colon cancer. ROS:   Please see the history of present illness.    All  14 point review of systems negative except as described per history of present illness  EKGs/Labs/Other Studies Reviewed:      Recent Labs: 05/22/2021: ALT 15 06/14/2021: BUN 24; Creatinine, Ser 1.03; Hemoglobin 14.3; Platelets 257; Potassium 4.5; Sodium 142  Recent Lipid Panel    Component Value Date/Time   CHOL 234 (H) 03/31/2018 1427   TRIG 89.0 03/31/2018 1427   HDL 58.20 03/31/2018 1427   CHOLHDL 4 03/31/2018 1427   VLDL 17.8 03/31/2018 1427   LDLCALC 158 (H) 03/31/2018 1427    Physical Exam:    VS:  BP (!) 144/90 (BP Location: Right Arm)    Pulse (!) 47    Ht 5\' 2"  (1.575 m)    Wt 174 lb (78.9 kg)    SpO2 96%    BMI 31.83 kg/m     Wt Readings from Last 3 Encounters:  07/12/21 174 lb (78.9 kg)  06/21/21 165 lb (74.8 kg)  05/31/21 173 lb (78.5 kg)     GEN:  Well nourished, well developed in no acute distress HEENT: Normal NECK: No JVD; No carotid bruits LYMPHATICS: No lymphadenopathy CARDIAC: RRR, no murmurs, no rubs, no gallops RESPIRATORY:  Clear to auscultation without rales, wheezing or rhonchi  ABDOMEN: Soft, non-tender, non-distended MUSCULOSKELETAL:  No edema; No deformity  SKIN: Warm and dry LOWER EXTREMITIES: no swelling NEUROLOGIC:  Alert and oriented x 3 PSYCHIATRIC:  Normal affect   ASSESSMENT:    1. Dilated cardiomyopathy ([place])   2. Left bundle branch block   3. Paroxysmal atrial flutter ([place])   4. Mixed hyperlipidemia    PLAN:    In order of problems listed above:  Dilated cardiomyopathy she is only on Entresto which I will continue.  We will not add any beta-blocker because of bradycardia.  We will continue this management. Junctional escape mechanism with left bundle branch which is chronic.  May be responsible for her not feeling well.  I asked her to stop amiodarone, we will put Zio patch on her week from now she will wear it for 2 weeks.  Of course we will be looking at chronotropic response.  She may require pacemaker because of  tachybradycardia syndrome as well as need for some medication that cannot be given in this clinical scenario.  I hope with discontinuation of amiodarone she will pick up with her ventricular rate and she will be able to do more if not pacemaker need to be considered. Mixed dyslipidemia, I do not have latest fasting lipid profile.  We will continue present management.   Medication Adjustments/Labs and Tests Ordered: Current medicines are reviewed at length with the patient today.  Concerns regarding medicines are outlined above.  No orders of the defined types were placed in this encounter.  Medication changes: No orders of the defined types were placed in this encounter.   Signed, Park Liter, MD, [facility] 07/12/2021 1:47 PM    [place]

## 2021-07-14 ENCOUNTER — Ambulatory Visit (INDEPENDENT_AMBULATORY_CARE_PROVIDER_SITE_OTHER): Payer: Medicare Other

## 2021-07-14 DIAGNOSIS — E782 Mixed hyperlipidemia: Secondary | ICD-10-CM

## 2021-07-14 DIAGNOSIS — I447 Left bundle-branch block, unspecified: Secondary | ICD-10-CM

## 2021-07-14 DIAGNOSIS — I420 Dilated cardiomyopathy: Secondary | ICD-10-CM

## 2021-07-14 DIAGNOSIS — I4892 Unspecified atrial flutter: Secondary | ICD-10-CM

## 2021-07-19 DIAGNOSIS — I4892 Unspecified atrial flutter: Secondary | ICD-10-CM

## 2021-07-19 DIAGNOSIS — I447 Left bundle-branch block, unspecified: Secondary | ICD-10-CM

## 2021-07-19 DIAGNOSIS — I420 Dilated cardiomyopathy: Secondary | ICD-10-CM

## 2021-07-19 DIAGNOSIS — E782 Mixed hyperlipidemia: Secondary | ICD-10-CM | POA: Diagnosis not present

## 2021-07-26 ENCOUNTER — Encounter: Payer: Self-pay | Admitting: Family Medicine

## 2021-07-26 MED ORDER — BENZONATATE 100 MG PO CAPS: 100.0000 mg | ORAL_CAPSULE | Freq: Three times a day (TID) | ORAL | 0 refills | Status: DC | PRN

## 2021-07-26 NOTE — Addendum Note (Signed)
Addended by: Darreld Mclean on: 07/26/2021 08:45 PM   Modules accepted: Orders

## 2021-07-27 ENCOUNTER — Encounter: Payer: Self-pay | Admitting: Family Medicine

## 2021-07-27 DIAGNOSIS — U071 COVID-19: Secondary | ICD-10-CM

## 2021-07-27 MED ORDER — MOLNUPIRAVIR EUA 200MG CAPSULE: 4.0000 | ORAL_CAPSULE | Freq: Two times a day (BID) | ORAL | 0 refills | Status: AC

## 2021-08-02 ENCOUNTER — Encounter: Payer: Self-pay | Admitting: Family Medicine

## 2021-08-07 ENCOUNTER — Telehealth: Payer: Self-pay

## 2021-08-07 MED ORDER — ENTRESTO 24-26 MG PO TABS: 1.0000 | ORAL_TABLET | Freq: Two times a day (BID) | ORAL | 3 refills | Status: DC

## 2021-08-07 MED ORDER — FLUOXETINE HCL 20 MG PO CAPS: 20.0000 mg | ORAL_CAPSULE | Freq: Every day | ORAL | 3 refills | Status: DC

## 2021-08-07 NOTE — Telephone Encounter (Signed)
Received fax for Refill request for Entresto and Fluoxetine to [facility]  but I don't see any provider names on either. Okay for Rx's? Pt does see cardiology.

## 2021-08-07 NOTE — Addendum Note (Signed)
Addended by: Lamar Blinks C on: 08/07/2021 05:04 PM   Modules accepted: Orders

## 2021-08-21 ENCOUNTER — Encounter: Payer: Self-pay | Admitting: Family Medicine

## 2021-08-21 DIAGNOSIS — E2839 Other primary ovarian failure: Secondary | ICD-10-CM

## 2021-08-30 ENCOUNTER — Telehealth: Payer: Self-pay

## 2021-08-30 NOTE — Telephone Encounter (Signed)
-----   Message from Park Liter, MD sent at 08/24/2021 10:28 AM EDT ----- ?Monitor showed very small burden of atrial fibrillation with controlled ventricular rate, she is already anticoagulated, therefore continue present management ?

## 2021-08-30 NOTE — Telephone Encounter (Signed)
Patient notified of results.

## 2021-08-31 ENCOUNTER — Other Ambulatory Visit: Payer: Self-pay

## 2021-08-31 ENCOUNTER — Ambulatory Visit (HOSPITAL_BASED_OUTPATIENT_CLINIC_OR_DEPARTMENT_OTHER)
Admission: RE | Admit: 2021-08-31 | Discharge: 2021-08-31 | Disposition: A | Discharge status: Home / Self Care | Payer: Medicare Other | Source: Ambulatory Visit | Attending: Family Medicine | Admitting: Family Medicine

## 2021-08-31 DIAGNOSIS — E2839 Other primary ovarian failure: Secondary | ICD-10-CM | POA: Diagnosis present

## 2021-09-01 ENCOUNTER — Ambulatory Visit (INDEPENDENT_AMBULATORY_CARE_PROVIDER_SITE_OTHER): Payer: Medicare Other | Admitting: Cardiology

## 2021-09-01 ENCOUNTER — Encounter: Payer: Self-pay | Admitting: Cardiology

## 2021-09-01 VITALS — BP 128/66 | HR 42 | Ht 62.0 in | Wt 175.1 lb

## 2021-09-01 DIAGNOSIS — Z7901 Long term (current) use of anticoagulants: Secondary | ICD-10-CM

## 2021-09-01 DIAGNOSIS — I4892 Unspecified atrial flutter: Secondary | ICD-10-CM

## 2021-09-01 DIAGNOSIS — I447 Left bundle-branch block, unspecified: Secondary | ICD-10-CM

## 2021-09-01 DIAGNOSIS — I420 Dilated cardiomyopathy: Secondary | ICD-10-CM | POA: Diagnosis not present

## 2021-09-01 NOTE — Progress Notes (Signed)
?Cardiology Office Note:   ? ?Date:  09/01/2021  ? ?ID:  Sheila Schmidt, DOB 03-Apr-1936, MRN [id number] ? ?PCP:  Darreld Mclean, MD  ?Cardiologist:  Jenne Campus, MD   ? ?Referring MD: Darreld Mclean, MD  ? ?Chief Complaint  ?Patient presents with  ? Follow-up  ? ? ?History of Present Illness:   ? ?Sheila Schmidt is a 86 y.o. female with past medical history significant for paroxysmal atrial fibrillation/atrial flutter, she also got history of cardiomyopathy, last echocardiogram done in November showed ejection fraction 45 to 50%, also intraventricular conduction delay left bundle branch block pattern also history of breast cancer, dyslipidemia, depression, arthritis. ?Originally she was referred to [place] because she relocated from [facility] and she was noted to be in atrial flutter.  Eventually we did a cardioversion with 200 J and she converted to sinus rhythm with first-degree AV block however when I saw her in the office last time in February she did have junctional escape rhythm with slow heart rate.  She is coming today 2 months for follow-up she complain of being weak tired exhausted.  She does not have much energy.  She is bradycardic EKG today shows sinus bradycardia rate of 42 last time I discontinue her amiodarone in spite of that her bradycardia persists.  She denies having any passing out may be dizzy symptoms but she is not convinced about that.  Denies have any chest pain tightness squeezing pressure burning chest. ? ?Past Medical History:  ?Diagnosis Date  ? Cancer [facility])   ?  Left breast carcinoma in situ  ? Cholelithiasis   ? Depression   ? 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 Hx of adenomatous colonic polyps   ? Hyperlipidemia   ? ? ?Past Surgical History:  ?Procedure Laterality Date  ? ATRIAL FLUTTER ABLATION    ? BREAST EXCISIONAL BIOPSY Left 2010  ? benign  ? CARDIOVERSION  05/2020  ? in [place]  ? CARDIOVERSION N/A 06/21/2021  ? Procedure: CARDIOVERSION;  Surgeon:  Geralynn Rile, MD;  Location: [place];  Service: Cardiovascular;  Laterality: N/A;  ? CHOLECYSTECTOMY    ? CHOLECYSTECTOMY, LAPAROSCOPIC  '06  ? [place]  ? KNEE ARTHROSCOPY    ? Left '00 [place]/ Right '08  ? lumpectomy- remote    ? benign  ? TOOTH EXTRACTION    ? TOTAL KNEE ARTHROPLASTY  02/05/2011  ? left  ? ? ?Current Medications: ?Current Meds  ?Medication Sig  ? acetaminophen-codeine (TYLENOL #3) 300-30 MG tablet Take 1 tablet by mouth every 6 (six) hours as needed for moderate pain.  ? albuterol (VENTOLIN HFA) 108 (90 Base) MCG/ACT inhaler Inhale 2 puffs into the lungs every 6 (six) hours as needed for wheezing or shortness of breath.  ? atorvastatin (LIPITOR) 20 MG tablet Take 1 tablet (20 mg total) by mouth daily.  ? benzonatate (TESSALON PERLES) 100 MG capsule Take 1 capsule (100 mg total) by mouth 3 (three) times daily as needed for cough.  ? Cholecalciferol 1000 UNITS tablet Take 1,000 Units by mouth daily.  ? famotidine (PEPCID) 40 MG tablet Take 40 mg by mouth as needed for heartburn or indigestion.  ? FLUoxetine (PROZAC) 20 MG capsule Take 1 capsule (20 mg total) by mouth daily.  ? melatonin 5 MG TABS Take 5 mg by mouth at bedtime.  ? rivaroxaban (XARELTO) 20 MG TABS tablet Take 1 tablet (20 mg total) by mouth daily with supper.  ?  sacubitril-valsartan (ENTRESTO) 24-26 MG Take 1 tablet by mouth 2 (two) times daily.  ?  ? ?Allergies:   Patient has no known allergies.  ? ?Social History  ? ?Socioeconomic History  ? Marital status: Widowed  ?  Spouse name: Not on file  ? Number of children: 3  ? Years of education: Not on file  ? Highest education level: Not on file  ?Occupational History  ? Occupation: retired   ?  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 Smoking status: Never  ?  Passive exposure: Never  ? Smokeless tobacco: Never  ?Vaping Use  ? Vaping Use: Never used  ?Substance and Sexual Activity  ? Alcohol use: Yes  ?  Comment: 1 glass per day  ? Drug  use: No  ? Sexual activity: Not on file  ?Other Topics Concern  ? Not on file  ?Social History Narrative  ? [employer]. Married '59, 3 daughters- '61, '64, '73; 2 grandchildren. Exercise - goes to gym 5/wk. ACP - directed to the [email or web address].[email or web address]/.  ? Lives with her daughter - goes between daughters home in [place] and her own home here  ? ?Social Determinants of Health  ? ?Financial Resource Strain: Low Risk   ? Difficulty of Paying Living Expenses: Not hard at all  ?Food Insecurity: No Food Insecurity  ? Worried About Charity fundraiser in the Last Year: Never true  ? Ran Out of Food in the Last Year: Never true  ?Transportation Needs: No Transportation Needs  ? Lack of Transportation (Medical): No  ? Lack of Transportation (Non-Medical): No  ?Physical Activity: Insufficiently Active  ? Days of Exercise per Week: 7 days  ? Minutes of Exercise per Session: 20 min  ?Stress: No Stress Concern Present  ? Feeling of Stress : Not at all  ?Social Connections: Socially Isolated  ? Frequency of Communication with Friends and Family: More than three times a week  ? Frequency of Social Gatherings with Friends and Family: More than three times a week  ? Attends Religious Services: Never  ? Active Member of Clubs or Organizations: No  ? Attends Archivist Meetings: Never  ? Marital Status: Widowed  ?  ? ?Family History: ?The patient's family history includes Cancer in her father; Diabetes in her mother; Heart attack in her mother; Heart disease in her mother and sister. There is no history of Breast cancer or Colon cancer. ?ROS:   ?Please see the history of present illness.    ?All 14 point review of systems negative except as described per history of present illness ? ?EKGs/Labs/Other Studies Reviewed:   ? ? ? ?Recent Labs: ?05/22/2021: ALT 15 ?06/14/2021: BUN 24; Creatinine, Ser 1.03; Hemoglobin 14.3; Platelets 257; Potassium 4.5; Sodium 142  ?Recent Lipid Panel ?   ?Component Value Date/Time  ? CHOL 234 (H)  03/31/2018 1427  ? TRIG 89.0 03/31/2018 1427  ? HDL 58.20 03/31/2018 1427  ? CHOLHDL 4 03/31/2018 1427  ? VLDL 17.8 03/31/2018 1427  ? LDLCALC 158 (H) 03/31/2018 1427  ? ? ?Physical Exam:   ? ?VS:  BP 128/66 (BP Location: Left Arm, Patient Position: Sitting)   Pulse (!) 42   Ht '5\' 2"'$  (1.575 m)   Wt 175 lb 1.3 oz (79.4 kg)   SpO2 93%   BMI 32.02 kg/m?    ? ?Wt Readings from Last 3 Encounters:  ?09/01/21 175 lb 1.3 oz (79.4 kg)  ?07/12/21 174 lb (78.9  kg)  ?06/21/21 165 lb (74.8 kg)  ?  ? ?GEN:  Well nourished, well developed in no acute distress ?HEENT: Normal ?NECK: No JVD; No carotid bruits ?LYMPHATICS: No lymphadenopathy ?CARDIAC: RRR, no murmurs, no rubs, no gallops ?RESPIRATORY:  Clear to auscultation without rales, wheezing or rhonchi  ?ABDOMEN: Soft, non-tender, non-distended ?MUSCULOSKELETAL:  No edema; No deformity  ?SKIN: Warm and dry ?LOWER EXTREMITIES: no swelling ?NEUROLOGIC:  Alert and oriented x 3 ?PSYCHIATRIC:  Normal affect  ? ?ASSESSMENT:   ? ?1. Dilated cardiomyopathy ([place])   ?2. Left bundle branch block   ?3. Paroxysmal atrial flutter ([place])   ?4. Chronic anticoagulation   ? ?PLAN:   ? ?In order of problems listed above: ? ?Paroxysmal atrial fibrillation: She is anticoagulated which I will continue.  She had 1 cardioversion done about a year ago and then another cardioversion just few months ago here after that she maintained very slow rhythm with sinus bradycardia.  Amiodarone has been discontinued in spite of that still very slow heart rate.  I think she is somebody who can benefit from pacemaker.  Pacemaker will allow [place] to use medication to prevent her tachycardia from happening, I will is also to use antiarrhythmic medications, she was on amiodarone before however that medication has been discontinued because of significant bradycardia which is symptomatic with fatigue weakness and tiredness.  On top of that she does have cardiomyopathy I would like to put her on beta-blocker obviously  because of bradycardia cannot do it. ?Intraventricular conduction delay left bundle branch block pattern but ejection fraction 45 to 50% what I would anticipate with this degree of intraventricular conduction de

## 2021-09-01 NOTE — Addendum Note (Signed)
Addended by: Edwyna Shell I on: 09/01/2021 11:58 AM ? ? Modules accepted: Orders ? ?

## 2021-09-01 NOTE — Patient Instructions (Signed)
Medication Instructions:  °Your physician recommends that you continue on your current medications as directed. Please refer to the Current Medication list given to you today. ° °*If you need a refill on your cardiac medications before your next appointment, please call your pharmacy* ° ° °Lab Work: °None °If you have labs (blood work) drawn today and your tests are completely normal, you will receive your results only by: °MyChart Message (if you have MyChart) OR °A paper copy in the mail °If you have any lab test that is abnormal or we need to change your treatment, we will call you to review the results. ° ° °Testing/Procedures: °None ° ° °Follow-Up: °At [facility], you and your health needs are our priority.  As part of our continuing mission to provide you with exceptional heart care, we have created designated Provider Care Teams.  These Care Teams include your primary Cardiologist (physician) and Advanced Practice Providers (APPs -  Physician Assistants and Nurse Practitioners) who all work together to provide you with the care you need, when you need it. ° °We recommend signing up for the patient portal called "MyChart".  Sign up information is provided on this After Visit Summary.  MyChart is used to connect with patients for Virtual Visits (Telemedicine).  Patients are able to view lab/test results, encounter notes, upcoming appointments, etc.  Non-urgent messages can be sent to your provider as well.   °To learn more about what you can do with MyChart, go to [email or web address].   ° °Your next appointment:   °3 month(s) ° °The format for your next appointment:   °In Person ° °Provider:   °Robert Krasowski, MD  ° ° °Other Instructions °None ° °

## 2021-09-05 ENCOUNTER — Encounter: Payer: Self-pay | Admitting: Family Medicine

## 2021-09-08 ENCOUNTER — Ambulatory Visit (INDEPENDENT_AMBULATORY_CARE_PROVIDER_SITE_OTHER): Payer: Medicare Other

## 2021-09-08 ENCOUNTER — Ambulatory Visit (INDEPENDENT_AMBULATORY_CARE_PROVIDER_SITE_OTHER): Payer: Medicare Other | Admitting: Pulmonary Disease

## 2021-09-08 ENCOUNTER — Encounter: Payer: Self-pay | Admitting: Pulmonary Disease

## 2021-09-08 VITALS — BP 124/72 | HR 48 | Temp 98.6°F | Ht 62.0 in | Wt 178.0 lb

## 2021-09-08 DIAGNOSIS — R0609 Other forms of dyspnea: Secondary | ICD-10-CM

## 2021-09-08 NOTE — Patient Instructions (Addendum)
Nice to see you ? ?We will try to get the pulmonary function tests and follow up visit with me April 18-20. ? ?Chest xray today ?

## 2021-09-12 ENCOUNTER — Ambulatory Visit (INDEPENDENT_AMBULATORY_CARE_PROVIDER_SITE_OTHER): Payer: Medicare Other | Admitting: Orthopaedic Surgery

## 2021-09-12 ENCOUNTER — Ambulatory Visit: Payer: Medicare Other | Admitting: Cardiology

## 2021-09-12 ENCOUNTER — Encounter: Payer: Self-pay | Admitting: Orthopaedic Surgery

## 2021-09-12 DIAGNOSIS — M1711 Unilateral primary osteoarthritis, right knee: Secondary | ICD-10-CM

## 2021-09-12 MED ORDER — BUPIVACAINE HCL 0.25 % IJ SOLN
2.0000 mL | INTRAMUSCULAR | Status: AC | PRN
  Administered 2021-09-12: 2 mL via INTRA_ARTICULAR

## 2021-09-12 MED ORDER — LIDOCAINE HCL 1 % IJ SOLN
2.0000 mL | INTRAMUSCULAR | Status: AC | PRN
  Administered 2021-09-12: 2 mL

## 2021-09-12 MED ORDER — METHYLPREDNISOLONE ACETATE 40 MG/ML IJ SUSP
40.0000 mg | INTRAMUSCULAR | Status: AC | PRN
  Administered 2021-09-12: 40 mg via INTRA_ARTICULAR

## 2021-09-12 NOTE — Progress Notes (Signed)
? ?Office Visit Note ?  ?Patient: Sheila Schmidt           ?Date of Birth: 02/28/1936           ?MRN: [id number] ?Visit Date: 09/12/2021 ?             ?Requested by: Darreld Mclean, MD ?[address] ?[address] ?[place],  [place] [address] ?PCP: Darreld Mclean, MD ? ? ?Assessment & Plan: ?Visit Diagnoses:  ?1. Unilateral primary osteoarthritis, right knee   ? ? ?Plan: Impression is right knee osteoarthritis.  Today, we discussed repeat cortisone injection.  I also discussed getting approval for viscosupplementation injection for which she is agreeable to.  She will follow-up with [place] once approved.  Call with concerns or questions. ? ?This patient is diagnosed with osteoarthritis of the knee(s).   ? ?Radiographs show evidence of joint space narrowing, osteophytes, subchondral sclerosis and/or subchondral cysts.  This patient has knee pain which interferes with functional and activities of daily living.   ? ?This patient has experienced inadequate response, adverse effects and/or intolerance with conservative treatments such as acetaminophen, NSAIDS, topical creams, physical therapy or regular exercise, knee bracing and/or weight loss.  ? ?This patient has experienced inadequate response or has a contraindication to intra articular steroid injections for at least 3 months.  ? ?This patient is not scheduled to have a total knee replacement within 6 months of starting treatment with viscosupplementation. ? ? ?Follow-Up Instructions: Return for once approved for visco inj.  ? ?Orders:  ?Orders Placed This Encounter  ?Procedures  ? Large Joint Inj: R knee  ? ?No orders of the defined types were placed in this encounter. ? ? ? ? Procedures: ?Large Joint Inj: R knee on 09/12/2021 3:52 PM ?Indications: pain ?Details: 22 G needle, anterolateral approach ?Medications: 2 mL lidocaine 1 %; 2 mL bupivacaine 0.25 %; 40 mg methylPREDNISolone acetate 40 MG/ML ? ? ? ? ?Clinical Data: ?No additional  findings. ? ? ?Subjective: ?Chief Complaint  ?Patient presents with  ? Right Knee - Follow-up  ? ? ?HPI patient is a pleasant 86 year old female who comes in today with chronic right knee pain.  History of underlying osteoarthritis.  She has been seeing [place] for this in the past where she is undergone cortisone injection which typically provides about 8 to 10 weeks relief.  She has not undergone viscosupplementation injection.  She has been interested in total knee arthroplasty but has been dealing with underlying A-fib.  She underwent cardioversion in January where she was told she now needs a pacemaker.  She is scheduled to see the cardiologist for this next month.  ? ?Review of Systems as detailed in HPI.  All others reviewed and negative. ? ? ?Objective: ?Vital Signs: There were no vitals taken for this visit. ? ?Physical Exam well-developed well-nourished female no acute distress.  Alert and oriented x3. ? ?Ortho Exam right knee exam shows no effusion.  Range of motion 0 to 120 degrees.  Medial and lateral joint line tenderness.  She is neurovascular intact distally. ? ?Specialty Comments:  ?No specialty comments available. ? ?Imaging: ?No new imaging ? ? ?PMFS History: ?Patient Active Problem List  ? Diagnosis Date Noted  ? Cancer ([place]) 05/25/2021  ? Left bundle branch block 04/12/2021  ? Dilated cardiomyopathy ([place]) 04/12/2021  ? Vasculitis ([place]) 02/22/2021  ? Anxiety state 02/12/2021  ? Body fluid retention 02/12/2021  ? Cervical intraepithelial neoplasia 02/12/2021  ? Insomnia 02/12/2021  ? Pruritus  of vulva 02/12/2021  ? Leg wound, right 10/03/2020  ? Bilateral primary osteoarthritis of knee 03/30/2019  ? Paroxysmal atrial fibrillation ([place]) 04/07/2015  ? Chronic anticoagulation 04/07/2015  ? Paroxysmal atrial flutter ([place]) 03/16/2015  ? Essential hypertension, benign 02/24/2014  ? Sciatica 12/08/2013  ? Hyperlipidemia 09/25/2013  ? Osteopenia 11/19/2012  ? Low back pain 01/26/2010  ? Carcinoma in situ of  breast 09/04/2009  ? DEPRESSION, PROLONGED 09/04/2009  ? DEGENERATIVE JOINT DISEASE, KNEES, BILATERAL 09/04/2009  ? PERIPHERAL EDEMA 09/04/2009  ? COLONIC POLYPS, ADENOMATOUS, HX OF 09/04/2009  ? Malignant tumor of breast ([place]) 06/11/2009  ? ?Past Medical History:  ?Diagnosis Date  ? Cancer [facility])   ?  Left breast carcinoma in situ  ? Cholelithiasis   ? Depression   ? 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 Hx of adenomatous colonic polyps   ? Hyperlipidemia   ?  ?Family History  ?Problem Relation Age of Onset  ? Diabetes Mother   ?     Deceased in 54s  ? Heart disease Mother   ?     cad/MI-fatal  ? Heart attack Mother   ? Cancer Father   ?     liver, Deceased in 64s  ? Heart disease Sister   ? Breast cancer Neg Hx   ? Colon cancer Neg Hx   ?  ?Past Surgical History:  ?Procedure Laterality Date  ? ATRIAL FLUTTER ABLATION    ? BREAST EXCISIONAL BIOPSY Left 2010  ? benign  ? CARDIOVERSION  05/2020  ? in [place]  ? CARDIOVERSION N/A 06/21/2021  ? Procedure: CARDIOVERSION;  Surgeon: Geralynn Rile, MD;  Location: [place];  Service: Cardiovascular;  Laterality: N/A;  ? CHOLECYSTECTOMY    ? CHOLECYSTECTOMY, LAPAROSCOPIC  '06  ? [place]  ? KNEE ARTHROSCOPY    ? Left '00 [place]/ Right '08  ? lumpectomy- remote    ? benign  ? TOOTH EXTRACTION    ? TOTAL KNEE ARTHROPLASTY  02/05/2011  ? left  ? ?Social History  ? ?Occupational History  ? Occupation: retired   ?  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 Smoking status: Never  ?  Passive exposure: Never  ? Smokeless tobacco: Never  ?Vaping Use  ? Vaping Use: Never used  ?Substance and Sexual Activity  ? Alcohol use: Yes  ?  Comment: 1 glass per day  ? Drug use: No  ? Sexual activity: Not on file  ? ? ? ? ? ? ?

## 2021-09-14 ENCOUNTER — Telehealth: Payer: Self-pay

## 2021-09-14 NOTE — Telephone Encounter (Signed)
Please submit for right knee gel inj 

## 2021-09-21 NOTE — Telephone Encounter (Signed)
Noted  

## 2021-10-02 ENCOUNTER — Encounter: Payer: Self-pay | Admitting: Cardiology

## 2021-10-02 DIAGNOSIS — I4892 Unspecified atrial flutter: Secondary | ICD-10-CM

## 2021-10-02 DIAGNOSIS — I480 Paroxysmal atrial fibrillation: Secondary | ICD-10-CM

## 2021-10-04 ENCOUNTER — Telehealth: Payer: Self-pay | Admitting: Cardiology

## 2021-10-04 NOTE — Telephone Encounter (Signed)
Error

## 2021-10-06 ENCOUNTER — Telehealth: Payer: Self-pay | Admitting: Cardiology

## 2021-10-06 NOTE — Telephone Encounter (Signed)
New message ? ?Pt would like to switch providers from Dr. Curt Bears to Dr. Lovena Le. Is this switch okay? ?

## 2021-10-10 NOTE — Telephone Encounter (Signed)
Sent MyChart message. ? ?Would advise Pt follow up with Dr. Quentin Ore for further treatment of afib. ? ?Will continue to follow. ?

## 2021-10-11 NOTE — Telephone Encounter (Signed)
Scheduled with Dr. Quentin Ore. ? ? ?

## 2021-10-12 ENCOUNTER — Encounter (HOSPITAL_BASED_OUTPATIENT_CLINIC_OR_DEPARTMENT_OTHER): Payer: Self-pay

## 2021-10-12 ENCOUNTER — Emergency Department (HOSPITAL_BASED_OUTPATIENT_CLINIC_OR_DEPARTMENT_OTHER)
Admission: EM | Admit: 2021-10-12 | Discharge: 2021-10-12 | Disposition: A | Discharge status: Home / Self Care | Payer: Medicare Other | Attending: Emergency Medicine | Admitting: Emergency Medicine

## 2021-10-12 ENCOUNTER — Emergency Department (HOSPITAL_BASED_OUTPATIENT_CLINIC_OR_DEPARTMENT_OTHER): Payer: Medicare Other

## 2021-10-12 ENCOUNTER — Ambulatory Visit: Payer: Medicare Other | Admitting: Cardiology

## 2021-10-12 ENCOUNTER — Other Ambulatory Visit: Payer: Self-pay

## 2021-10-12 DIAGNOSIS — Z7901 Long term (current) use of anticoagulants: Secondary | ICD-10-CM | POA: Diagnosis not present

## 2021-10-12 DIAGNOSIS — I4891 Unspecified atrial fibrillation: Secondary | ICD-10-CM | POA: Diagnosis not present

## 2021-10-12 DIAGNOSIS — R000 Tachycardia, unspecified: Secondary | ICD-10-CM | POA: Diagnosis present

## 2021-10-12 LAB — BASIC METABOLIC PANEL
Anion gap: 5 (ref 5–15)
BUN: 17 mg/dL (ref 8–23)
CO2: 25 mmol/L (ref 22–32)
Calcium: 9.2 mg/dL (ref 8.9–10.3)
Chloride: 107 mmol/L (ref 98–111)
Creatinine, Ser: 0.95 mg/dL (ref 0.44–1.00)
GFR, Estimated: 59 mL/min — ABNORMAL LOW (ref >60)
Glucose, Bld: 122 mg/dL — ABNORMAL HIGH (ref 70–99)
Potassium: 3.8 mmol/L (ref 3.5–5.1)
Sodium: 137 mmol/L (ref 135–145)

## 2021-10-12 LAB — CBC WITH DIFFERENTIAL/PLATELET
Abs Immature Granulocytes: 0.04 10*3/uL (ref 0.00–0.07)
Basophils Absolute: 0.1 10*3/uL (ref 0.0–0.1)
Basophils Relative: 1 %
Eosinophils Absolute: 0.1 10*3/uL (ref 0.0–0.5)
Eosinophils Relative: 1 %
HCT: 38.8 % (ref 36.0–46.0)
Hemoglobin: 13.4 g/dL (ref 12.0–15.0)
Immature Granulocytes: 0 %
Lymphocytes Relative: 30 %
Lymphs Abs: 3.4 10*3/uL (ref 0.7–4.0)
MCH: 35.2 pg — ABNORMAL HIGH (ref 26.0–34.0)
MCHC: 34.5 g/dL (ref 30.0–36.0)
MCV: 101.8 fL — ABNORMAL HIGH (ref 80.0–100.0)
Monocytes Absolute: 0.8 10*3/uL (ref 0.1–1.0)
Monocytes Relative: 7 %
Neutro Abs: 6.8 10*3/uL (ref 1.7–7.7)
Neutrophils Relative %: 61 %
Platelets: 197 10*3/uL (ref 150–400)
RBC: 3.81 MIL/uL — ABNORMAL LOW (ref 3.87–5.11)
RDW: 12.8 % (ref 11.5–15.5)
WBC: 11.2 10*3/uL — ABNORMAL HIGH (ref 4.0–10.5)
nRBC: 0 % (ref 0.0–0.2)

## 2021-10-12 LAB — MAGNESIUM: Magnesium: 1.8 mg/dL (ref 1.7–2.4)

## 2021-10-12 MED ORDER — SODIUM CHLORIDE 0.9 % IV BOLUS
1000.0000 mL | Freq: Once | INTRAVENOUS | Status: AC
  Administered 2021-10-12: 1000 mL via INTRAVENOUS

## 2021-10-12 NOTE — ED Provider Notes (Addendum)
?[place] EMERGENCY DEPARTMENT ?Provider Note ? ? ?CSN: [id number] ?Arrival date & time: 10/12/21  1909 ? ?  ? ?History ? ?Chief Complaint  ?Patient presents with  ? Irregular Heart Beat  ? ? ?Sheila Schmidt is a 86 y.o. female. ? ?86 yo F with a chief complaints of her heart rate increasing and decreasing very rapidly.  She does this on her fitness watch.  She was at the gym this morning and realized it was quite high.  She stopped exercising and went home and after which has been checking it fairly frequently.  A family member came over and noted that it was also was going up and down.  They then came here for evaluation.  Patient denies any symptoms with this.  She denies any chest pain denies shortness of breath denies fatigue denies abdominal pain denies cough congestion or fever.  Denies decreased oral intake.  Denies any recent medication changes. ? ? ? ?  ? ?Home Medications ?Prior to Admission medications   ?Medication Sig Start Date End Date Taking? Authorizing Provider  ?acetaminophen-codeine (TYLENOL #3) 300-30 MG tablet Take 1 tablet by mouth every 6 (six) hours as needed for moderate pain. 05/22/21   Copland, Gay Filler, MD  ?albuterol (VENTOLIN HFA) 108 (90 Base) MCG/ACT inhaler Inhale 2 puffs into the lungs every 6 (six) hours as needed for wheezing or shortness of breath. 05/22/21   Copland, Gay Filler, MD  ?atorvastatin (LIPITOR) 20 MG tablet Take 1 tablet (20 mg total) by mouth daily. 07/09/21   Copland, Gay Filler, MD  ?Cholecalciferol 1000 UNITS tablet Take 1,000 Units by mouth daily.    [provider]  ?famotidine (PEPCID) 40 MG tablet Take 40 mg by mouth as needed for heartburn or indigestion. 01/02/21   [provider]  ?FLUoxetine (PROZAC) 20 MG capsule Take 1 capsule (20 mg total) by mouth daily. 08/07/21   Copland, Gay Filler, MD  ?melatonin 5 MG TABS Take 5 mg by mouth at bedtime.    [provider]  ?rivaroxaban (XARELTO) 20 MG TABS tablet Take 1 tablet  (20 mg total) by mouth daily with supper. 07/09/21   Copland, Gay Filler, MD  ?sacubitril-valsartan (ENTRESTO) 24-26 MG Take 1 tablet by mouth 2 (two) times daily. 08/07/21   Copland, Gay Filler, MD  ?   ? ?Allergies    ?Patient has no known allergies.   ? ?Review of Systems   ?Review of Systems ? ?Physical Exam ?Updated Vital Signs ?BP 123/86   Pulse 76   Temp 98 ?F (36.7 ?C) (Oral)   Resp 16   Ht '5\' 2"'$  (1.575 m)   Wt 72.6 kg   SpO2 98%   BMI 29.26 kg/m?  ?Physical Exam ?Vitals and nursing note reviewed.  ?Constitutional:   ?   General: She is not in acute distress. ?   Appearance: She is well-developed. She is not diaphoretic.  ?HENT:  ?   Head: Normocephalic and atraumatic.  ?Eyes:  ?   Pupils: Pupils are equal, round, and reactive to light.  ?Cardiovascular:  ?   Rate and Rhythm: Normal rate. Rhythm irregular.  ?   Heart sounds: No murmur heard. ?  No friction rub. No gallop.  ?Pulmonary:  ?   Effort: Pulmonary effort is normal.  ?   Breath sounds: No wheezing or rales.  ?Abdominal:  ?   General: There is no distension.  ?   Palpations: Abdomen is soft.  ?   Tenderness: There  is no abdominal tenderness.  ?Musculoskeletal:     ?   General: No tenderness.  ?   Cervical back: Normal range of motion and neck supple.  ?Skin: ?   General: Skin is warm and dry.  ?Neurological:  ?   Mental Status: She is alert and oriented to person, place, and time.  ?Psychiatric:     ?   Behavior: Behavior normal.  ? ? ?ED Results / Procedures / Treatments   ?Labs ?(all labs ordered are listed, but only abnormal results are displayed) ?Labs Reviewed  ?CBC WITH DIFFERENTIAL/PLATELET - Abnormal; Notable for the following components:  ?    Result Value  ? WBC 11.2 (*)   ? RBC 3.81 (*)   ? MCV 101.8 (*)   ? MCH 35.2 (*)   ? All other components within normal limits  ?BASIC METABOLIC PANEL - Abnormal; Notable for the following components:  ? Glucose, Bld 122 (*)   ? GFR, Estimated 59 (*)   ? All other components within normal limits   ?MAGNESIUM  ? ? ?EKG ?EKG Interpretation ? ?Date/Time:  Thursday Oct 12 2021 19:20:53 EDT ?Ventricular Rate:  104 ?PR Interval:    ?QRS Duration: 135 ?QT Interval:  391 ?QTC Calculation: 515 ?R Axis:   124 ?Text Interpretation: Atrial fibrillation Atrial premature complex Nonspecific intraventricular conduction delay Otherwise no significant change Confirmed by Deno Etienne [phone]) on 10/12/2021 7:57:03 PM ? ?Radiology ?DG Chest Port 1 View ? ?Result Date: 10/12/2021 ?CLINICAL DATA:  Palpitations. EXAM: PORTABLE CHEST 1 VIEW COMPARISON:  Chest x-ray 03/01/2011. FINDINGS: The heart is mildly enlarged, a new finding. The lungs and costophrenic angles are clear. There is no pneumothorax. No acute fractures are seen. IMPRESSION: 1. New cardiomegaly. 2. The lungs are clear. Electronically Signed   By: Ronney Asters M.D.   On: 10/12/2021 19:40   ? ?Procedures ?.1-3 Lead EKG Interpretation ?Performed by: Deno Etienne, DO ?Authorized by: Deno Etienne, DO  ? ?  Interpretation: normal   ?  ECG rate:  70 ?  ECG rate assessment: normal   ?  Rhythm: atrial flutter   ?  Ectopy: none   ?  Conduction: normal    ? ? ?Medications Ordered in ED ?Medications  ?sodium chloride 0.9 % bolus 1,000 mL (1,000 mLs Intravenous New Bag/Given 10/12/21 1943)  ? ? ?ED Course/ Medical Decision Making/ A&P ?  ?                        ?Medical Decision Making ?Amount and/or Complexity of Data Reviewed ?Labs: ordered. ?Radiology: ordered. ? ? ?86 yo F with a chief complaints of palpitations.  She is asymptomatic with this but noted incidentally on a fitness watch while she was in the gym.  The patient then began checking her heart rate fairly frequently and realized that it was changing quite rapidly and decided to come to the Emergency Department for evaluation. ? ?We will obtain a laboratory evaluation to assess for electrolyte abnormality.  Bolus of IV fluids.  Chest x-ray independently interpreted by me with out focal infiltrate or pneumothorax.  Radiology  read with concern for new cardiomegaly. ? ?Patient's heart rate has improved somewhat with IV fluids.  She has a mild leukocytosis.  No significant electrolyte abnormality no significant anemia. ? ?Telemonitor now most consistent with aflutter w/3:1 block.  ? ?CHA2DS2/VAS Stroke Risk Points  Current as of 17 minutes ago  ?   4 >= 2 Points: High  Risk  ?1 - 1.99 Points: Medium Risk  ?0 Points: Low Risk  ?  Last Change: N/A   ?  ? Details   ? This score determines the patient's risk of having a stroke if the  ?patient has atrial fibrillation.  ?  ?  ? Points Metrics  ?0 Has Congestive Heart Failure:  No   ? Current as of 17 minutes ago  ?0 Has Vascular Disease:  No   ? Current as of 17 minutes ago  ?1 Has Hypertension:  Yes   ? Current as of 17 minutes ago  ?2 Age:  39   ? Current as of 17 minutes ago  ?0 Has Diabetes:  No   ? Current as of 17 minutes ago  ?0 Had Stroke:  No  Had TIA:  No  Had Thromboembolism:  No   ? Current as of 17 minutes ago  ?1 Female:  Yes   ? Current as of 17 minutes ago  ?  ?  ?  ?  ? ? ?8:38 PM:  I have discussed the diagnosis/risks/treatment options with the patient and family.  Evaluation and diagnostic testing in the emergency department does not suggest an emergent condition requiring admission or immediate intervention beyond what has been performed at this time.  They will follow up with  Cards. We also discussed returning to the ED immediately if new or worsening sx occur. We discussed the sx which are most concerning (e.g., sudden worsening pain, fever, inability to tolerate by mouth) that necessitate immediate return. Medications administered to the patient during their visit and any new prescriptions provided to the patient are listed below. ? ?Medications given during this visit ?Medications  ?sodium chloride 0.9 % bolus 1,000 mL (1,000 mLs Intravenous New Bag/Given 10/12/21 1943)  ? ? ? ?The patient appears reasonably screen and/or stabilized for discharge and I doubt any other  medical condition or other [facility] requiring further screening, evaluation, or treatment in the ED at this time prior to discharge.  ? ? ? ? ? ? ? ?Final Clinical Impression(s) / ED Diagnoses ?Final diagnoses:  ?Atrial fi

## 2021-10-12 NOTE — Discharge Instructions (Signed)
Continue to take your medications as prescribed.  Please return for lightheadedness feeling like you may pass out chest pain or difficulty breathing. ?

## 2021-10-12 NOTE — ED Triage Notes (Signed)
Family was checking her heart rate on her fitbit was shooting between the 50s and 110's.  ?Patient has no symptoms but is followed by cardiology due to bradycardia.  ?

## 2021-10-16 ENCOUNTER — Ambulatory Visit: Payer: Medicare Other | Admitting: Pulmonary Disease

## 2021-10-17 ENCOUNTER — Ambulatory Visit (INDEPENDENT_AMBULATORY_CARE_PROVIDER_SITE_OTHER): Payer: Medicare Other | Admitting: Cardiology

## 2021-10-17 ENCOUNTER — Encounter: Payer: Self-pay | Admitting: Cardiology

## 2021-10-17 VITALS — BP 130/62 | HR 108 | Ht 62.0 in | Wt 173.8 lb

## 2021-10-17 DIAGNOSIS — I447 Left bundle-branch block, unspecified: Secondary | ICD-10-CM

## 2021-10-17 DIAGNOSIS — E782 Mixed hyperlipidemia: Secondary | ICD-10-CM

## 2021-10-17 DIAGNOSIS — I420 Dilated cardiomyopathy: Secondary | ICD-10-CM | POA: Diagnosis not present

## 2021-10-17 DIAGNOSIS — I480 Paroxysmal atrial fibrillation: Secondary | ICD-10-CM

## 2021-10-17 DIAGNOSIS — I4892 Unspecified atrial flutter: Secondary | ICD-10-CM

## 2021-10-17 DIAGNOSIS — I10 Essential (primary) hypertension: Secondary | ICD-10-CM

## 2021-10-17 NOTE — Patient Instructions (Signed)
Medication Instructions:  Your physician recommends that you continue on your current medications as directed. Please refer to the Current Medication list given to you today.  *If you need a refill on your cardiac medications before your next appointment, please call your pharmacy*   Lab Work: None Ordered If you have labs (blood work) drawn today and your tests are completely normal, you will receive your results only by: MyChart Message (if you have MyChart) OR A paper copy in the mail If you have any lab test that is abnormal or we need to change your treatment, we will call you to review the results.   Testing/Procedures: None Ordered   Follow-Up: At [facility], you and your health needs are our priority.  As part of our continuing mission to provide you with exceptional heart care, we have created designated Provider Care Teams.  These Care Teams include your primary Cardiologist (physician) and Advanced Practice Providers (APPs -  Physician Assistants and Nurse Practitioners) who all work together to provide you with the care you need, when you need it.  We recommend signing up for the patient portal called "MyChart".  Sign up information is provided on this After Visit Summary.  MyChart is used to connect with patients for Virtual Visits (Telemedicine).  Patients are able to view lab/test results, encounter notes, upcoming appointments, etc.  Non-urgent messages can be sent to your provider as well.   To learn more about what you can do with MyChart, go to [email or web address].    Your next appointment:   4 month(s)  The format for your next appointment:   In Person  Provider:   Robert Krasowski, MD    Other Instructions NA  

## 2021-10-17 NOTE — Progress Notes (Signed)
?Cardiology Office Note:   ? ?Date:  10/17/2021  ? ?ID:  Sheila Schmidt, DOB [birth year], MRN [id number] ? ?PCP:  Darreld Mclean, MD  ?Cardiologist:  Jenne Campus, MD   ? ?Referring MD: Deno Etienne, DO  ? ?Chief Complaint  ?Patient presents with  ? Atrial Fibrillation  ?   ?  ? ? ?History of Present Illness:   ? ?Sheila Schmidt is a 86 y.o. female   with past medical history significant for paroxysmal atrial fibrillation/atrial flutter, she also got history of cardiomyopathy, last echocardiogram done in November showed ejection fraction 45 to 50%, also intraventricular conduction delay left bundle branch block pattern also history of breast cancer, dyslipidemia, depression, arthritis. ?Originally she was referred to [place] because she relocated from [facility] and she was noted to be in atrial flutter.  Eventually we did a cardioversion with 200 J and she converted to sinus rhythm with first-degree AV block however when I saw her in the office last time in February she did have junctional escape rhythm with slow heart rate.  ?At that time we discontinued her amiodarone since she was very weak tired and exhausted.  At that time we have made referral to our EP colleagues for consideration of pacemaker implantation and more advanced management of her atrial fibrillation/atrial flutter.  However there was some issue she did not like Dr. Curt Bears and cancel her appointment never follow-up with him.  Recently she ended up coming to the hospital emergency room because of palpitation she was found to be in atrial flutter with 3-1 AV conduction.  She was given some extra fluids and symptoms improved and that she is being discharged home.  She is follow-up with me today to the to talk about her issues she said overall most of the time she is doing well.  Denies have any chest pain tightness squeezing pressure burning chest no palpitations she denies have any dizziness or passing out. ? ?Past Medical History:  ?Diagnosis Date   ? Cancer [facility])   ?  Left breast carcinoma in situ  ? Cholelithiasis   ? Depression   ? 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 Hx of adenomatous colonic polyps   ? Hyperlipidemia   ? ? ?Past Surgical History:  ?Procedure Laterality Date  ? ATRIAL FLUTTER ABLATION    ? BREAST EXCISIONAL BIOPSY Left 2010  ? benign  ? CARDIOVERSION  05/2020  ? in [place]  ? CARDIOVERSION N/A 06/21/2021  ? Procedure: CARDIOVERSION;  Surgeon: Geralynn Rile, MD;  Location: [place];  Service: Cardiovascular;  Laterality: N/A;  ? CHOLECYSTECTOMY    ? CHOLECYSTECTOMY, LAPAROSCOPIC  '06  ? [place]  ? KNEE ARTHROSCOPY    ? Left '00 [place]/ Right '08  ? lumpectomy- remote    ? benign  ? TOOTH EXTRACTION    ? TOTAL KNEE ARTHROPLASTY  02/05/2011  ? left  ? ? ?Current Medications: ?Current Meds  ?Medication Sig  ? acetaminophen-codeine (TYLENOL #3) 300-30 MG tablet Take 1 tablet by mouth every 6 (six) hours as needed for moderate pain.  ? albuterol (VENTOLIN HFA) 108 (90 Base) MCG/ACT inhaler Inhale 2 puffs into the lungs every 6 (six) hours as needed for wheezing or shortness of breath.  ? atorvastatin (LIPITOR) 20 MG tablet Take 1 tablet (20 mg total) by mouth daily.  ? Cholecalciferol 1000 UNITS tablet Take 1,000 Units by mouth daily.  ? famotidine (PEPCID) 40 MG tablet Take 40 mg by  mouth as needed for heartburn or indigestion.  ? FLUoxetine (PROZAC) 20 MG capsule Take 1 capsule (20 mg total) by mouth daily.  ? melatonin 5 MG TABS Take 5 mg by mouth at bedtime.  ? rivaroxaban (XARELTO) 20 MG TABS tablet Take 1 tablet (20 mg total) by mouth daily with supper.  ? sacubitril-valsartan (ENTRESTO) 24-26 MG Take 1 tablet by mouth 2 (two) times daily.  ?  ? ?Allergies:   Patient has no known allergies.  ? ?Social History  ? ?Socioeconomic History  ? Marital status: Widowed  ?  Spouse name: Not on file  ? Number of children: 3  ? Years of education: Not on file  ? Highest education level: Not on file  ?Occupational History  ?  Occupation: retired   ?  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 Smoking status: Never  ?  Passive exposure: Never  ? Smokeless tobacco: Never  ?Vaping Use  ? Vaping Use: Never used  ?Substance and Sexual Activity  ? Alcohol use: Yes  ?  Comment: 1 glass per day  ? Drug use: No  ? Sexual activity: Not on file  ?Other Topics Concern  ? Not on file  ?Social History Narrative  ? [employer]. Married '59, 3 daughters- '61, '64, '73; 2 grandchildren. Exercise - goes to gym 5/wk. ACP - directed to the [email or web address]/.  ? Lives with her daughter - goes between daughters home in [place] and her own home here  ? ?Social Determinants of Health  ? ?Financial Resource Strain: Low Risk   ? Difficulty of Paying Living Expenses: Not hard at all  ?Food Insecurity: No Food Insecurity  ? Worried About Charity fundraiser in the Last Year: Never true  ? Ran Out of Food in the Last Year: Never true  ?Transportation Needs: No Transportation Needs  ? Lack of Transportation (Medical): No  ? Lack of Transportation (Non-Medical): No  ?Physical Activity: Insufficiently Active  ? Days of Exercise per Week: 7 days  ? Minutes of Exercise per Session: 20 min  ?Stress: No Stress Concern Present  ? Feeling of Stress : Not at all  ?Social Connections: Socially Isolated  ? Frequency of Communication with Friends and Family: More than three times a week  ? Frequency of Social Gatherings with Friends and Family: More than three times a week  ? Attends Religious Services: Never  ? Active Member of Clubs or Organizations: No  ? Attends Archivist Meetings: Never  ? Marital Status: Widowed  ?  ? ?Family History: ?The patient's family history includes Cancer in her father; Diabetes in her mother; Heart attack in her mother; Heart disease in her mother and sister. There is no history of Breast cancer or Colon cancer. ?ROS:   ?Please see the history of present illness.    ?All 14 point review of  systems negative except as described per history of present illness ? ?EKGs/Labs/Other Studies Reviewed:   ? ? ? ?Recent Labs: ?05/22/2021: ALT 15 ?10/12/2021: BUN 17; Creatinine, Ser 0.95; Hemoglobin 13.4; Magnesium 1.8; Platelets 197; Potassium 3.8; Sodium 137  ?Recent Lipid Panel ?   ?Component Value Date/Time  ? CHOL 234 (H) 03/31/2018 1427  ? TRIG 89.0 03/31/2018 1427  ? HDL 58.20 03/31/2018 1427  ? CHOLHDL 4 03/31/2018 1427  ? VLDL 17.8 03/31/2018 1427  ? LDLCALC 158 (H) 03/31/2018 1427  ? ? ?Physical Exam:   ? ?VS:  BP 130/62 (BP  Location: Right Arm, Patient Position: Sitting)   Pulse (!) 108   Ht '5\' 2"'$  (1.575 m)   Wt 173 lb 12.8 oz (78.8 kg)   SpO2 95%   BMI 31.79 kg/m?    ? ?Wt Readings from Last 3 Encounters:  ?10/17/21 173 lb 12.8 oz (78.8 kg)  ?10/12/21 160 lb (72.6 kg)  ?09/08/21 178 lb (80.7 kg)  ?  ? ?GEN:  Well nourished, well developed in no acute distress ?HEENT: Normal ?NECK: No JVD; No carotid bruits ?LYMPHATICS: No lymphadenopathy ?CARDIAC: RRR, no murmurs, no rubs, no gallops ?RESPIRATORY:  Clear to auscultation without rales, wheezing or rhonchi  ?ABDOMEN: Soft, non-tender, non-distended ?MUSCULOSKELETAL:  No edema; No deformity  ?SKIN: Warm and dry ?LOWER EXTREMITIES: no swelling ?NEUROLOGIC:  Alert and oriented x 3 ?PSYCHIATRIC:  Normal affect  ? ?ASSESSMENT:   ? ?1. Paroxysmal atrial flutter ([place])   ?2. Paroxysmal atrial fibrillation ([place])   ?3. Essential hypertension, benign   ?4. Dilated cardiomyopathy ([place])   ?5. Left bundle branch block   ?6. Mixed hyperlipidemia   ? ?PLAN:   ? ?In order of problems listed above: ? ?Paroxysmal atrial flutter, paroxysmal atrial fibrillation.  Today looks like she is in slow atrial flutter with 3-1 AV conduction.  She is anticoagulated which I will continue.  She is not on any AV blocking agent.  Rate appears to be controlled however look like she does have evidence of conduction disease problem.  She does have appoint with our EP team next month will  wait for their opinion.  I think she can benefit from pacemaker implantation which allowed [place] to use antiarrhythmic medication to control her rhythm.  However I explained to her that this is a relative indicati

## 2021-11-16 NOTE — Patient Instructions (Incomplete)
It was great to see you again today, I will be in touch with your lab work.  Please see me in about 6 months assuming all is well  I would recommend getting a COVID-19 booster and the shingles vaccine series if not done already-these can be given at your pharmacy Please try some OTC cortisone and/ or benadry cream on your bug bits.  Let me know if not clearing up We froze 2 skin lesion on your left cheek today - do be sure to follow-up with your dermatologist   Ask the electrophysiologist if you can exercise lightly- like gentle exercise bike  Let me know if you decide you would like to see gastroenterology to discuss colon cancer screening and I am glad to set this up for you

## 2021-11-16 NOTE — Progress Notes (Unsigned)
[place] at [facility] [address] [place], [place] [address] [phone]  Date:  11/20/2021   Name:  Sheila Schmidt   DOB:  10-Aug-1935   MRN:  [id number]  PCP:  Darreld Mclean, MD    Chief Complaint: No chief complaint on file.   History of Present Illness:  Sheila Schmidt is a 86 y.o. very pleasant female patient who presents with the following:  Patient seen today for 88-monthfollow-up- history of atrial fibrillation treated with rate control and anticoagulation, hyperlipidemia, osteopenia, breast cancer Her husband died within the last couple of years [place]  [facility]  She presented to the ER about 1 month ago with tachycardia-she was found to be in atrial fibs/flutter Seen outpatient by cardiology on 5/9: Paroxysmal atrial flutter, paroxysmal atrial fibrillation.  Today looks like she is in slow atrial flutter with 3-1 AV conduction.  She is anticoagulated which I will continue.  She is not on any AV blocking agent.  Rate appears to be controlled however look like she does have evidence of conduction disease problem.  She does have appoint with our EP team next month will wait for their opinion.  I think she can benefit from pacemaker implantation which allowed [place] use antiarrhythmic medication to control her rhythm.  However I explained to her that this is a relative indication for pacing.  And she understand that she will have discussion about all risk benefits as well as alternative approaches to this problem.  We can consider atrial flutter ablation in this situation however we will not be able to use antiarrhythmic therapy because of bradycardia. Cardiomyopathy ejection fraction 45% which I would anticipate to see in somebody with left bundle branch block.  She is compensated on  the physical exam and doing well overall.  On appropriate guideline directed medical therapy not on beta-blocker because of bradycardia Dyslipidemia followed by primary care physician Patient will be referred to our EP team for consideration of pacemaker implantation.  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  Therefore in my opinion she required pacemaker to protect her heart rate from slowing down too much and that would lead u[place]ato use antiarrhythmic medications.     Lipitor Fluoxetine Xarelto Entresto  Shingrix COVID-19 booster  Her thyroid has not been checked recently, this should be done due to arrhythmia. BMP done in the ER last month, normal Minimal leukocytosis present at that time Patient Active Problem List   Diagnosis Date Noted   Cancer ([place] 05/25/2021   Left bundle branch block 04/12/2021   Dilated cardiomyopathy ([place] 04/12/2021   Vasculitis ([place] 02/22/2021   Anxiety state 02/12/2021   Body fluid retention 02/12/2021   Cervical intraepithelial neoplasia 02/12/2021   Insomnia 02/12/2021   Pruritus of vulva 02/12/2021   Leg wound, right 10/03/2020   Bilateral primary osteoarthritis of knee 03/30/2019   Paroxysmal atrial fibrillation ([place] 04/07/2015   Chronic anticoagulation 04/07/2015   Paroxysmal atrial flutter ([place] 03/16/2015   Essential hypertension, benign 02/24/2014   Sciatica 12/08/2013   Hyperlipidemia 09/25/2013   Osteopenia 11/19/2012  Low back pain 01/26/2010   Carcinoma in situ of breast 09/04/2009   DEPRESSION, PROLONGED 09/04/2009   DEGENERATIVE JOINT DISEASE, KNEES, BILATERAL 09/04/2009   PERIPHERAL EDEMA 09/04/2009   COLONIC POLYPS, ADENOMATOUS, HX OF 09/04/2009   Malignant tumor of breast ([place]) 06/11/2009     Past Medical History:  Diagnosis Date   Cancer ([place])     Left breast carcinoma in situ   Cholelithiasis    Depression    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   Hx of adenomatous colonic polyps    Hyperlipidemia     Past Surgical History:  Procedure Laterality Date   ATRIAL FLUTTER ABLATION     BREAST EXCISIONAL BIOPSY Left 2010   benign   CARDIOVERSION  05/2020   in [place] 06/21/2021   Procedure: CARDIOVERSION;  Surgeon: Geralynn Rile, MD;  Location: [facility];  Service: Cardiovascular;  Laterality: N/A;   CHOLECYSTECTOMY     CHOLECYSTECTOMY, LAPAROSCOPIC  '06   [place] ARTHROSCOPY     Left '00 [place]/ Right '08   lumpectomy- remote     benign   TOOTH EXTRACTION     TOTAL KNEE ARTHROPLASTY  02/05/2011   left    Social History   Tobacco Use   Smoking status: Never    Passive exposure: Never   Smokeless tobacco: Never  Vaping Use   Vaping Use: Never used  Substance Use Topics   Alcohol use: Yes    Comment: 1 glass per day   Drug use: No    Family History  Problem Relation Age of Onset   Diabetes Mother        Deceased in 70s   Heart disease Mother        cad/MI-fatal   Heart attack Mother    Cancer Father        liver, Deceased in 14s   Heart disease Sister    Breast cancer Neg Hx    Colon cancer Neg Hx     No Known Allergies  Medication list has been reviewed and updated.  Current Outpatient Medications on File Prior to Visit  Medication Sig Dispense Refill   acetaminophen-codeine (TYLENOL #3) 300-30 MG tablet Take 1 tablet by mouth every 6 (six) hours as needed for moderate pain. 30 tablet 0   albuterol (VENTOLIN HFA) 108 (90 Base) MCG/ACT inhaler Inhale 2 puffs into the lungs every 6 (six) hours as needed for wheezing or shortness of breath. 18 g 6   atorvastatin (LIPITOR) 20 MG tablet Take 1 tablet (20 mg total) by mouth daily. 90 tablet 3   Cholecalciferol 1000 UNITS tablet Take 1,000 Units  by mouth daily.     famotidine (PEPCID) 40 MG tablet Take 40 mg by mouth as needed for heartburn or indigestion.     FLUoxetine (PROZAC) 20 MG capsule Take 1 capsule (20 mg total) by mouth daily. 90 capsule 3   melatonin 5 MG TABS Take 5 mg by mouth at bedtime.     rivaroxaban (XARELTO) 20 MG TABS tablet Take 1 tablet (20 mg total) by mouth daily with supper. 90 tablet 3   sacubitril-valsartan (ENTRESTO) 24-26 MG Take 1 tablet by mouth 2 (two) times daily. 180 tablet 3   No current facility-administered medications on file prior to visit.    Review of Systems:  As per HPI- otherwise negative.   Physical Examination: There were no vitals filed for this visit. There were no vitals  filed for this visit. There is no height or weight on file to calculate BMI. Ideal Body Weight:    GEN: no acute distress. HEENT: Atraumatic, Normocephalic.  Ears and Nose: No external deformity. CV: RRR, No M/G/R. No JVD. No thrill. No extra heart sounds. PULM: CTA B, no wheezes, crackles, rhonchi. No retractions. No resp. distress. No accessory muscle use. ABD: S, NT, ND, +BS. No rebound. No HSM. EXTR: No c/c/e PSYCH: Normally interactive. Conversant.    Assessment and Plan: ***  Signed Lamar Blinks, MD

## 2021-11-19 NOTE — Progress Notes (Incomplete)
$'@Patient'g$  ID: Sheila Schmidt, female    DOB: February 25, 1936, 86 y.o.   MRN: [id number]  Chief Complaint  Patient presents with  . Consult    Consult for SOB. Has been occurring for 2 years. Started when she got cardioverted for afib 2 years ago.     Referring provider: Copland, Gay Filler, MD  HPI:   PMH:  Smoker/ Smoking History:  Maintenance:   Pt of:   11/19/2021  - Visit     Questionaires / Pulmonary Flowsheets:   ACT:      No data to display          MMRC:     No data to display          Epworth:      No data to display          Tests:   FENO:  No results found for: "NITRICOXIDE"  PFT:     No data to display          WALK:      No data to display          Imaging: No results found.  Lab Results:  CBC    Component Value Date/Time   WBC 11.2 (H) 10/12/2021 1940   RBC 3.81 (L) 10/12/2021 1940   HGB 13.4 10/12/2021 1940   HGB 14.3 06/14/2021 1602   HGB 14.9 11/12/2013 0958   HCT 38.8 10/12/2021 1940   HCT 40.2 06/14/2021 1602   HCT 45.0 11/12/2013 0958   PLT 197 10/12/2021 1940   PLT 257 06/14/2021 1602   MCV 101.8 (H) 10/12/2021 1940   MCV 98 (H) 06/14/2021 1602   MCV 100.9 11/12/2013 0958   MCH 35.2 (H) 10/12/2021 1940   MCHC 34.5 10/12/2021 1940   RDW 12.8 10/12/2021 1940   RDW 11.9 06/14/2021 1602   RDW 13.0 11/12/2013 0958   LYMPHSABS 3.4 10/12/2021 1940   LYMPHSABS 4.2 (H) 06/14/2021 1602   LYMPHSABS 2.2 11/12/2013 0958   MONOABS 0.8 10/12/2021 1940   MONOABS 0.9 11/12/2013 0958   EOSABS 0.1 10/12/2021 1940   EOSABS 0.1 06/14/2021 1602   BASOSABS 0.1 10/12/2021 1940   BASOSABS 0.1 06/14/2021 1602   BASOSABS 0.1 11/12/2013 0958    BMET    Component Value Date/Time   NA 137 10/12/2021 1940   NA 142 06/14/2021 1602   NA 144 11/12/2013 0958   K 3.8 10/12/2021 1940   K 4.5 11/12/2013 0958   CL 107 10/12/2021 1940   CL 106 11/13/2012 1100   CO2 25 10/12/2021 1940   CO2 23 11/12/2013 0958   GLUCOSE 122  (H) 10/12/2021 1940   GLUCOSE 98 11/12/2013 0958   GLUCOSE 95 11/13/2012 1100   BUN 17 10/12/2021 1940   BUN 24 06/14/2021 1602   BUN 10.8 11/12/2013 0958   CREATININE 0.95 10/12/2021 1940   CREATININE 0.74 03/09/2020 1343   CREATININE 0.7 11/12/2013 0958   CALCIUM 9.2 10/12/2021 1940   CALCIUM 9.6 11/12/2013 0958   GFRNONAA 59 (L) 10/12/2021 1940   GFRAA 75 07/21/2019 1319    BNP No results found for: "BNP"  ProBNP    Component Value Date/Time   PROBNP 205.0 (H) 11/02/2016 1143    Specialty Problems   None   No Known Allergies  Immunization History  Administered Date(s) Administered  . Fluad Quad(high Dose 65+) 03/09/2020, 02/20/2021  . Influenza Split 04/23/2011, 04/02/2012  . Influenza Whole 03/15/2009  . Influenza, High Dose Seasonal PF 03/23/2013,  07/18/2016, 06/12/2017, 03/31/2018  . Influenza,inj,Quad PF,6+ Mos 02/24/2014  . Influenza-Unspecified 03/11/2014  . PFIZER(Purple Top)SARS-COV-2 Vaccination 08/31/2019, 09/14/2019  . PNEUMOCOCCAL CONJUGATE-20 05/22/2021  . Pneumococcal Polysaccharide-23 04/02/2012  . Td 08/11/2002  . Tdap 09/25/2013  . Zoster, Live 09/21/2010    Past Medical History:  Diagnosis Date  . Cancer ([place])     Left breast carcinoma in situ  . Cholelithiasis   . Depression   . 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 Hx of adenomatous colonic polyps   . Hyperlipidemia     Tobacco History: Social History   Tobacco Use  Smoking Status Never  . Passive exposure: Never  Smokeless Tobacco Never   Counseling given: Not Answered   Continue to not smoke  Outpatient Encounter Medications as of 09/08/2021  Medication Sig  . acetaminophen-codeine (TYLENOL #3) 300-30 MG tablet Take 1 tablet by mouth every 6 (six) hours as needed for moderate pain.  Marland Kitchen albuterol (VENTOLIN HFA) 108 (90 Base) MCG/ACT inhaler Inhale 2 puffs into the lungs every 6 (six) hours as needed for wheezing or shortness of breath.  Marland Kitchen atorvastatin  (LIPITOR) 20 MG tablet Take 1 tablet (20 mg total) by mouth daily.  . Cholecalciferol 1000 UNITS tablet Take 1,000 Units by mouth daily.  . famotidine (PEPCID) 40 MG tablet Take 40 mg by mouth as needed for heartburn or indigestion.  Marland Kitchen FLUoxetine (PROZAC) 20 MG capsule Take 1 capsule (20 mg total) by mouth daily.  . melatonin 5 MG TABS Take 5 mg by mouth at bedtime.  . rivaroxaban (XARELTO) 20 MG TABS tablet Take 1 tablet (20 mg total) by mouth daily with supper.  . sacubitril-valsartan (ENTRESTO) 24-26 MG Take 1 tablet by mouth 2 (two) times daily.  . [DISCONTINUED] benzonatate (TESSALON PERLES) 100 MG capsule Take 1 capsule (100 mg total) by mouth 3 (three) times daily as needed for cough.   No facility-administered encounter medications on file as of 09/08/2021.     Review of Systems  Review of Systems   Physical Exam  BP 124/72 (BP Location: Left Arm, Patient Position: Sitting, Cuff Size: Normal)   Pulse (!) 48 Comment: pt states she normally has low HR  Temp 98.6 F (37 C) (Oral)   Ht '5\' 2"'$  (1.575 m)   Wt 178 lb (80.7 kg)   SpO2 98%   BMI 32.56 kg/m   Wt Readings from Last 5 Encounters:  10/17/21 173 lb 12.8 oz (78.8 kg)  10/12/21 160 lb (72.6 kg)  09/08/21 178 lb (80.7 kg)  09/01/21 175 lb 1.3 oz (79.4 kg)  07/12/21 174 lb (78.9 kg)    BMI Readings from Last 5 Encounters:  10/17/21 31.79 kg/m  10/12/21 29.26 kg/m  09/08/21 32.56 kg/m  09/01/21 32.02 kg/m  07/12/21 31.83 kg/m     Physical Exam C   Assessment & Plan:    DOE: suspect largely driven by cardiac issues, reduced EF, dilated LA, atrial arrhythmia. Bradycardic on exam, query lack of chronotropic for exertion. PFTs for further evaluation. CXR today given no recent chest imaging.  Return in about 19 days (around 09/27/2021).   Lanier Clam, MD 11/19/2021

## 2021-11-20 ENCOUNTER — Ambulatory Visit: Payer: Medicare Other | Admitting: Family Medicine

## 2021-11-20 ENCOUNTER — Encounter: Payer: Self-pay | Admitting: Family Medicine

## 2021-11-20 VITALS — BP 124/80 | HR 116 | Temp 97.6°F | Resp 18 | Ht 62.0 in | Wt 175.6 lb

## 2021-11-20 DIAGNOSIS — R7309 Other abnormal glucose: Secondary | ICD-10-CM | POA: Diagnosis not present

## 2021-11-20 DIAGNOSIS — I10 Essential (primary) hypertension: Secondary | ICD-10-CM | POA: Diagnosis not present

## 2021-11-20 DIAGNOSIS — L821 Other seborrheic keratosis: Secondary | ICD-10-CM | POA: Diagnosis not present

## 2021-11-20 DIAGNOSIS — W57XXXA Bitten or stung by nonvenomous insect and other nonvenomous arthropods, initial encounter: Secondary | ICD-10-CM

## 2021-11-20 DIAGNOSIS — S80861A Insect bite (nonvenomous), right lower leg, initial encounter: Secondary | ICD-10-CM

## 2021-11-20 DIAGNOSIS — I480 Paroxysmal atrial fibrillation: Secondary | ICD-10-CM | POA: Diagnosis not present

## 2021-11-20 LAB — CBC
HCT: 41.2 % (ref 36.0–46.0)
Hemoglobin: 13.9 g/dL (ref 12.0–15.0)
MCHC: 33.8 g/dL (ref 30.0–36.0)
MCV: 104.2 fl — ABNORMAL HIGH (ref 78.0–100.0)
Platelets: 190 10*3/uL (ref 150.0–400.0)
RBC: 3.95 Mil/uL (ref 3.87–5.11)
RDW: 13.3 % (ref 11.5–15.5)
WBC: 8.5 10*3/uL (ref 4.0–10.5)

## 2021-11-20 LAB — HEMOGLOBIN A1C: Hgb A1c MFr Bld: 5 % (ref 4.6–6.5)

## 2021-11-20 LAB — TSH: TSH: 2.92 u[IU]/mL (ref 0.35–5.50)

## 2021-11-23 ENCOUNTER — Ambulatory Visit (INDEPENDENT_AMBULATORY_CARE_PROVIDER_SITE_OTHER): Payer: Medicare Other | Admitting: Cardiology

## 2021-11-23 ENCOUNTER — Ambulatory Visit (HOSPITAL_COMMUNITY): Payer: Medicare Other | Attending: Cardiology

## 2021-11-23 ENCOUNTER — Encounter: Payer: Self-pay | Admitting: Cardiology

## 2021-11-23 VITALS — BP 128/76 | HR 72 | Ht 62.2 in | Wt 175.2 lb

## 2021-11-23 DIAGNOSIS — I495 Sick sinus syndrome: Secondary | ICD-10-CM | POA: Insufficient documentation

## 2021-11-23 DIAGNOSIS — I10 Essential (primary) hypertension: Secondary | ICD-10-CM | POA: Diagnosis not present

## 2021-11-23 DIAGNOSIS — I480 Paroxysmal atrial fibrillation: Secondary | ICD-10-CM | POA: Insufficient documentation

## 2021-11-23 DIAGNOSIS — I4892 Unspecified atrial flutter: Secondary | ICD-10-CM

## 2021-11-23 LAB — ECHOCARDIOGRAM COMPLETE
Area-P 1/2: 6.83 cm2
Height: 62.2 in
MV M vel: 3.63 m/s
MV Peak grad: 52.6 mmHg
P 1/2 time: 347 msec
S' Lateral: 3.9 cm
Weight: 2803.2 oz

## 2021-11-23 NOTE — Patient Instructions (Addendum)
Medication Instructions:  Your physician recommends that you continue on your current medications as directed. Please refer to the Current Medication list given to you today.  Labwork: You will get labwork today: BMP  Testing/Procedures: Your physician has requested that you have an echocardiogram. Echocardiography is a painless test that uses sound waves to create images of your heart. It provides your doctor with information about the size and shape of your heart and how well your heart's chambers and valves are working. This procedure takes approximately one hour. There are no restrictions for this procedure.   Please schedule for Echocardiogram.    Follow-Up: See instruction letter...   Any Other Special Instructions Will Be Listed Below (If Applicable).  If you need a refill on your cardiac medications before your next appointment, please call your pharmacy.   Important Information About Sugar      Pacemaker Implantation, Adult Pacemaker implantation is a procedure to place a pacemaker inside the chest. A pacemaker is a small computer that sends electrical signals to the heart and helps the heart beat normally. A pacemaker also stores information about heart rhythms. You may need pacemaker implantation if you have: A slow heartbeat (bradycardia). Loss of consciousness that happens repeatedly (syncope) or repeated episodes of dizziness or light-headedness because of an irregular heart rate. Shortness of breath (dyspnea) due to heart problems. The pacemaker usually attaches to your heart through a wire called a lead. One or two leads may be needed. There are different types of pacemakers: Transvenous pacemaker. This type is placed under the skin or muscle of your upper chest area. The lead goes through a vein in the chest area to reach the inside of the heart. Epicardial pacemaker. This type is placed under the skin or muscle of your chest or abdomen. The lead goes through your  chest to the outside of the heart. Tell a health care provider about: Any allergies you have. All medicines you are taking, including vitamins, herbs, eye drops, creams, and over-the-counter medicines. Any problems you or family members have had with anesthetic medicines. Any blood or bone disorders you have. Any surgeries you have had. Any medical conditions you have. Whether you are pregnant or may be pregnant. What are the risks? Generally, this is a safe procedure. However, problems may occur, including: Infection. Bleeding. Failure of the pacemaker or the lead. Collapse of a lung or bleeding into a lung. Blood clot inside a blood vessel with a lead. Damage to the heart. Infection inside the heart (endocarditis). Allergic reactions to medicines. What happens before the procedure? Staying hydrated Follow instructions from your health care provider about hydration, which may include: Up to 2 hours before the procedure - you may continue to drink clear liquids, such as water, clear fruit juice, black coffee, and plain tea.  Eating and drinking restrictions Follow instructions from your health care provider about eating and drinking, which may include: 8 hours before the procedure - stop eating heavy meals or foods, such as meat, fried foods, or fatty foods. 6 hours before the procedure - stop eating light meals or foods, such as toast or cereal. 6 hours before the procedure - stop drinking milk or drinks that contain milk. 2 hours before the procedure - stop drinking clear liquids. Medicines Ask your health care provider about: Changing or stopping your regular medicines. This is especially important if you are taking diabetes medicines or blood thinners. Taking medicines such as aspirin and ibuprofen. These medicines can thin your blood.  Do not take these medicines unless your health care provider tells you to take them. Taking over-the-counter medicines, vitamins, herbs, and  supplements. Tests You may have: A heart evaluation. This may include: An electrocardiogram (ECG). This involves placing patches on your skin to check your heart rhythm. A chest X-ray. An echocardiogram. This is a test that uses sound waves (ultrasound) to produce an image of the heart. A cardiac rhythm monitor. This is used to record your heart rhythm and any events for a longer period of time. Blood tests. Genetic testing. General instructions Do not use any products that contain nicotine or tobacco for at least 4 weeks before the procedure. These products include cigarettes, e-cigarettes, and chewing tobacco. If you need help quitting, ask your health care provider. Ask your health care provider: How your surgery site will be marked. What steps will be taken to help prevent infection. These steps may include: Removing hair at the surgery site. Washing skin with a germ-killing soap. Receiving antibiotic medicine. Plan to have someone take you home from the hospital or clinic. If you will be going home right after the procedure, plan to have someone with you for 24 hours. What happens during the procedure? An IV will be inserted into one of your veins. You will be given one or more of the following: A medicine to help you relax (sedative). A medicine to numb the area (local anesthetic). A medicine to make you fall asleep (general anesthetic). The next steps vary depending on the type of pacemaker you will be getting. If you are getting a transvenous pacemaker: An incision will be made in your upper chest. A pocket will be made for the pacemaker. It may be placed under the skin or between layers of muscle. The lead will be inserted into a blood vessel that goes to the heart. While X-rays are taken by an imaging machine (fluoroscopy), the lead will be advanced through the vein to the inside of your heart. The other end of the lead will be tunneled under the skin and attached to the  pacemaker. If you are getting an epicardial pacemaker: An incision will be made near your ribs or breastbone (sternum) for the lead. The lead will be attached to the outside of your heart. Another incision will be made in your chest or upper abdomen to create a pocket for the pacemaker. The free end of the lead will be tunneled under the skin and attached to the pacemaker. The transvenous or epicardial pacemaker will be tested. Imaging studies may be done to check the lead position. The incisions will be closed with stitches (sutures), adhesive strips, or skin glue. Bandages (dressings) will be placed over the incisions. The procedure may vary among health care providers and hospitals. What happens after the procedure? Your blood pressure, heart rate, breathing rate, and blood oxygen level will be monitored until you leave the hospital or clinic. You may be given antibiotics. You will be given pain medicine. An ECG and chest X-rays will be done. You may need to wear a continuous type of ECG (Holter monitor) to check your heart rhythm. Your health care provider will program the pacemaker. If you were given a sedative during the procedure, it can affect you for several hours. Do not drive or operate machinery until your health care provider says that it is safe. You will be given a pacemaker identification card. This card lists the implant date, device model, and manufacturer of your pacemaker. Summary A pacemaker is  a small computer that sends electrical signals to the heart and helps the heart beat normally. There are different types of pacemakers. A pacemaker may be placed under the skin or muscle of your chest or abdomen. Follow instructions from your health care provider about eating and drinking and about taking medicines before the procedure. This information is not intended to replace advice given to you by your health care provider. Make sure you discuss any questions you have with your  health care provider. Document Revised: 02/07/2021 Document Reviewed: 04/29/2019 Elsevier Patient Education  [place].

## 2021-11-23 NOTE — Progress Notes (Signed)
Electrophysiology Office Note:    Date:  11/23/2021   ID:  AWA BACHICHA, DOB 07-10-1935, MRN [id number]  PCP:  Darreld Mclean, MD  [facility] Cardiologist:  None  [facility] Electrophysiologist:  Vickie Epley, MD   Referring MD: Darreld Mclean, MD   Chief Complaint: Consult for tachy-brady syndrome  History of Present Illness:    Sheila Schmidt is a 86 y.o. female who presents for an evaluation of tachy-brady syndrome and possible PPM at the request of Dr. Agustin Cree. Their medical history includes paroxysmal atrial fibrillation/atrial flutter, cardiomyopathy, left breast carcinoma in situ, cholelithiasis, hyperlipidemia, and depression.  Previously followed by Dr. Curt Bears. She was most recently seen in cardiology by Dr. Agustin Cree 10/17/2021. At that visit she was in slow atrial flutter with 3-1 AV conduction. She was referred to EP for consideration of pacemaker implantation.  Today, she is accompanied by her daughter. Overall, she is not able to feel her fast or slow heart beats. She does feel constantly fatigued and is frequently short of breath.  She also complains of right knee pain that prevents her from going outside to walk. She believes she will need a right total knee arthroplasty soon. She has tried going to the gym and using a treadmill and stationary bike. However, she is hesitant to exercise as she previously had atrial flutter triggered by her exercise at the gym.   Of note, she reports prior lymph node removal and lumpectomy due to her left breast cancer.  She denies any chest pain, or peripheral edema. No lightheadedness, headaches, syncope, orthopnea, or PND.     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 ARTHROSCOPY     Left '00 [place]/ Right '08   lumpectomy- remote     benign   TOOTH EXTRACTION     TOTAL KNEE ARTHROPLASTY  02/05/2011   left    Current Medications: Current Meds  Medication Sig   acetaminophen-codeine (TYLENOL #3) 300-30 MG tablet Take 1 tablet by mouth every 6 (six) hours as needed for moderate pain.   albuterol (VENTOLIN HFA) 108 (90 Base) MCG/ACT inhaler Inhale 2 puffs into the lungs every 6 (six) hours as needed for wheezing or shortness of breath.   atorvastatin (LIPITOR) 20 MG tablet Take 1 tablet (20 mg total) by mouth daily.   Cholecalciferol 1000 UNITS tablet Take 1,000 Units by mouth daily.   famotidine (PEPCID) 40 MG tablet Take 40 mg by mouth as needed for heartburn or indigestion.   FLUoxetine (PROZAC) 20 MG capsule Take 1 capsule (20 mg total) by mouth daily.   melatonin 5 MG TABS Take 5 mg by mouth at bedtime.   rivaroxaban (XARELTO) 20 MG TABS tablet Take 1 tablet (20 mg total) by mouth daily with supper.  sacubitril-valsartan (ENTRESTO) 24-26 MG Take 1 tablet by mouth 2 (two) times daily.     Allergies:   Patient has no known allergies.   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. Married '59, 3 daughters- '61, '64, '73; 2 grandchildren. Exercise - goes to gym 5/wk. ACP - directed to the [email or web address]   Lives with her daughter - goes between daughters home in [place] and her own home here   Social Determinants of Health   Financial Resource Strain: Low Risk  (02/12/2021)   Overall Financial Resource Strain (CARDIA)    Difficulty of Paying Living Expenses: Not hard at all  Food Insecurity: No Food Insecurity (02/12/2021)   Hunger Vital Sign    Worried About Running Out of Food in the Last Year: Never true    [place] in the Last Year: Never true  Transportation Needs: No Transportation Needs (02/12/2021)   PRAPARE - Hydrologist (Medical): No    Lack of Transportation (Non-Medical): No  Physical Activity: Insufficiently Active (02/12/2021)   Exercise Vital Sign    Days of Exercise per Week: 7 days    Minutes of Exercise per Session: 20 min  Stress: No Stress Concern Present (02/12/2021)   [place]    Feeling of Stress : Not at all  Social Connections: Socially Isolated (02/12/2021)   Social Connection and Isolation Panel [NHANES]    Frequency of Communication with Friends and Family: More than three times a week    Frequency of Social Gatherings with Friends and Family: More than three times a week    Attends Religious Services: Never    Marine scientist or Organizations: No    Attends Archivist Meetings: Never    Marital Status: Widowed     Family History: The patient's family history includes Cancer in her father; Diabetes in her mother; Heart attack in her mother; Heart disease in her mother and sister. There is no history of Breast cancer or Colon cancer.  ROS:    Please see the history of present illness.    (+) Right knee pain (+) Fatigue (+) Shortness of breath All other systems reviewed and are negative.  EKGs/Labs/Other Studies Reviewed:    The following studies were reviewed today:  08/2021  Monitor: Patch Wear Time:  13 days and 21 hours (2023-02-08T14:38:01-499 to 2023-02-22T12:18:48-0500)   Patient had a min HR of 31 bpm, max HR of 126 bpm, and avg HR of 51 bpm. Predominant underlying rhythm was Sinus Rhythm. 3 Supraventricular Tachycardia runs occurred, the run with the fastest interval lasting 12 beats with a max rate of 126 bpm (avg 114  bpm); the run with  the fastest interval was also the longest. Atrial Fibrillation/Flutter occurred (1% burden), ranging from 57-108 bpm (avg of 88 bpm), the longest lasting 3 hours 48 mins with an avg rate of 88 bpm. Atrial Flutter may be possible Atrial  Tachycardia with variable block. Isolated SVEs were rare (<1.0%), SVE Couplets were rare (<1.0%), and SVE Triplets were rare (<1.0%). Isolated VEs were rare (<1.0%), and no VE Couplets or VE Triplets were present.   Summary and conclusions: Episode of atrial fibrillation total burden of 1% with average heart rate of 88, total duration of atrial fibrillation 3 hours 48 minutes. 3 episode of supraventricular tachycardia.  04/21/2021  Echo:  1. Technically difficult study. Left ventricular ejection fraction, by  estimation, is 45 to 50%. The left ventricle has mildly decreased  function. The left ventricle demonstrates regional wall motion  abnormalities. Septal hypokinesis. Left ventricular  diastolic parameters are indeterminate.   2. Right ventricular systolic function is normal. The right ventricular  size is normal. There is mildly elevated pulmonary artery systolic  pressure. The estimated right ventricular systolic pressure is 09.3 mmHg.   3. Left atrial size was moderately dilated.   4. The mitral valve is normal in structure. Trivial mitral  valve  regurgitation.   5. The aortic valve was not well visualized. Aortic valve regurgitation  is trivial. No aortic stenosis is present.   6. Aortic dilatation noted. There is mild dilatation of the ascending  aorta, measuring 37 mm.   7. The inferior vena cava is normal in size with greater than 50%  respiratory variability, suggesting right atrial pressure of 3 mmHg.    EKG:   EKG is personally reviewed.  11/23/2021: Atrial flutter.   Recent Labs: 05/22/2021: ALT 15 10/12/2021: BUN 17; Creatinine, Ser 0.95; Magnesium 1.8; Potassium 3.8; Sodium 137 11/20/2021: Hemoglobin 13.9; Platelets 190.0; TSH 2.92   Recent Lipid Panel    Component Value Date/Time   CHOL 234 (H) 03/31/2018 1427   TRIG 89.0 03/31/2018 1427   HDL 58.20 03/31/2018 1427   CHOLHDL 4 03/31/2018 1427   VLDL 17.8 03/31/2018 1427   LDLCALC 158 (H) 03/31/2018 1427    Physical Exam:    VS:  BP 128/76   Pulse 72   Ht 5' 2.2" (1.58 m)   Wt 175 lb 3.2 oz (79.5 kg)   SpO2 97%   BMI 31.84 kg/m     Wt Readings from Last 3 Encounters:  11/23/21 175 lb 3.2 oz (79.5 kg)  11/20/21 175 lb 9.6 oz (79.7 kg)  10/17/21 173 lb 12.8 oz (78.8 kg)     GEN: Well nourished, well developed in no acute distress.  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 NEUROLOGIC:  Alert and oriented x 3 PSYCHIATRIC:  Normal affect       ASSESSMENT:    1. Paroxysmal atrial fibrillation (HCC)   2. Paroxysmal atrial flutter ([place])   3. Essential hypertension, benign   4. Tachycardia-bradycardia syndrome ([place])    PLAN:    In order of problems listed above:  #Paroxysmal atrial fibrillation and  flutter #Tachycardia-bradycardia syndrome The patient is having difficult to control arrhythmia secondary to her A-fib and flutter.  In the past she has had profound bradycardia that is  symptomatic when rate control efforts are pursued.  Today she is tachycardic and has associated fatigue and dyspnea. Antiarrhythmic drugs are not going to be a good option moving forward.  I discussed using a pacemaker to help regulate her heart rhythms.  Pacemaker would allow [place] to initiate beta-blockers or antiarrhythmic drugs more safely.  This strategy was discussed in detail the patient and her family.  I discussed the pacemaker implant procedure in detail including the risks and recovery.  She understands this and wishes to proceed.  I will get an echo before the procedure to confirm type of pacemaker (dual-chamber versus CRT-D).  She will hold her Xarelto for 3 days prior to the pacemaker implant.  Plan for [place] system.  Risks, benefits, alternatives to PPM implantation were discussed in detail with the patient today. The patient understands that the risks include but are not limited to bleeding, infection, pneumothorax, perforation, tamponade, vascular damage, renal failure, MI, stroke, death, and lead dislodgement and wishes to proceed.  We will therefore schedule device implantation at the next available time.  #Hypertension Controlled Continue current medications including Entresto.    Total time spent with patient today 60 minutes. This includes reviewing records, evaluating the patient and coordinating care.  Medication Adjustments/Labs and Tests Ordered: Current medicines are reviewed at length with the patient today.  Concerns regarding medicines are outlined above.  Orders Placed This Encounter  Procedures   Basic Metabolic Panel (BMET)   ECHOCARDIOGRAM COMPLETE   No orders of the defined types were placed in this encounter.   I,Mathew Stumpf,acting as a Education administrator for Vickie Epley, MD.,have documented all relevant documentation on the behalf of Vickie Epley, MD,as directed by  Vickie Epley, MD while in the presence of Vickie Epley, MD.  I, Vickie Epley, MD, have reviewed all documentation for this visit. The documentation on 11/23/21 for the exam, diagnosis, procedures, and orders are all accurate and complete.   Signed, Hilton Cork. Quentin Ore, MD, [facility], [facility] 11/23/2021 9:15 PM    Electrophysiology [facility]

## 2021-11-24 LAB — BASIC METABOLIC PANEL
BUN/Creatinine Ratio: 14 (ref 12–28)
BUN: 13 mg/dL (ref 8–27)
CO2: 19 mmol/L — ABNORMAL LOW (ref 20–29)
Calcium: 10.2 mg/dL (ref 8.7–10.3)
Chloride: 104 mmol/L (ref 96–106)
Creatinine, Ser: 0.91 mg/dL (ref 0.57–1.00)
Glucose: 85 mg/dL (ref 70–99)
Potassium: 4.3 mmol/L (ref 3.5–5.2)
Sodium: 140 mmol/L (ref 134–144)
eGFR: 63 mL/min/{1.73_m2} (ref >59)

## 2021-11-29 NOTE — Addendum Note (Signed)
Addended by: Carylon Perches on: 11/29/2021 07:49 AM   Modules accepted: Orders

## 2021-11-30 ENCOUNTER — Encounter: Payer: Self-pay | Admitting: Cardiology

## 2021-11-30 ENCOUNTER — Encounter: Payer: Self-pay | Admitting: Family Medicine

## 2021-12-04 ENCOUNTER — Encounter (HOSPITAL_COMMUNITY)
Admission: RE | Disposition: A | Discharge status: Home / Self Care | Payer: Self-pay | Source: Home / Self Care | Attending: Cardiology

## 2021-12-04 ENCOUNTER — Ambulatory Visit (HOSPITAL_COMMUNITY)
Admission: RE | Admit: 2021-12-04 | Discharge: 2021-12-05 | Disposition: A | Discharge status: Home / Self Care | Payer: Medicare Other | Attending: Cardiology | Admitting: Cardiology

## 2021-12-04 ENCOUNTER — Other Ambulatory Visit: Payer: Self-pay

## 2021-12-04 DIAGNOSIS — Z8249 Family history of ischemic heart disease and other diseases of the circulatory system: Secondary | ICD-10-CM | POA: Insufficient documentation

## 2021-12-04 DIAGNOSIS — I428 Other cardiomyopathies: Principal | ICD-10-CM | POA: Insufficient documentation

## 2021-12-04 DIAGNOSIS — I495 Sick sinus syndrome: Secondary | ICD-10-CM | POA: Insufficient documentation

## 2021-12-04 DIAGNOSIS — Z7901 Long term (current) use of anticoagulants: Secondary | ICD-10-CM | POA: Insufficient documentation

## 2021-12-04 DIAGNOSIS — Z853 Personal history of malignant neoplasm of breast: Secondary | ICD-10-CM | POA: Diagnosis not present

## 2021-12-04 DIAGNOSIS — I4892 Unspecified atrial flutter: Secondary | ICD-10-CM | POA: Diagnosis not present

## 2021-12-04 DIAGNOSIS — I10 Essential (primary) hypertension: Secondary | ICD-10-CM | POA: Diagnosis not present

## 2021-12-04 DIAGNOSIS — I4891 Unspecified atrial fibrillation: Secondary | ICD-10-CM | POA: Diagnosis present

## 2021-12-04 DIAGNOSIS — I472 Ventricular tachycardia, unspecified: Secondary | ICD-10-CM | POA: Diagnosis not present

## 2021-12-04 DIAGNOSIS — R0600 Dyspnea, unspecified: Secondary | ICD-10-CM | POA: Diagnosis not present

## 2021-12-04 DIAGNOSIS — I447 Left bundle-branch block, unspecified: Secondary | ICD-10-CM | POA: Insufficient documentation

## 2021-12-04 DIAGNOSIS — E785 Hyperlipidemia, unspecified: Secondary | ICD-10-CM | POA: Insufficient documentation

## 2021-12-04 DIAGNOSIS — I480 Paroxysmal atrial fibrillation: Secondary | ICD-10-CM | POA: Insufficient documentation

## 2021-12-04 HISTORY — PX: BIV PACEMAKER INSERTION CRT-P: EP1199

## 2021-12-04 SURGERY — BIV PACEMAKER INSERTION CRT-P

## 2021-12-04 MED ORDER — MIDAZOLAM HCL 5 MG/5ML IJ SOLN
INTRAMUSCULAR | Status: DC | PRN
  Administered 2021-12-04: 1 mg via INTRAVENOUS
  Administered 2021-12-04: .5 mg via INTRAVENOUS

## 2021-12-04 MED ORDER — ALBUTEROL SULFATE HFA 108 (90 BASE) MCG/ACT IN AERS
2.0000 | INHALATION_SPRAY | Freq: Four times a day (QID) | RESPIRATORY_TRACT | Status: DC | PRN
Start: 2021-12-04 — End: 2021-12-04

## 2021-12-04 MED ORDER — CEFAZOLIN SODIUM-DEXTROSE 2-4 GM/100ML-% IV SOLN
INTRAVENOUS | Status: AC
  Filled 2021-12-04: qty 100

## 2021-12-04 MED ORDER — MELATONIN 5 MG PO TABS
5.0000 mg | ORAL_TABLET | Freq: Every day | ORAL | Status: DC
  Administered 2021-12-04: 5 mg via ORAL
  Filled 2021-12-04: qty 1

## 2021-12-04 MED ORDER — ALBUTEROL SULFATE (2.5 MG/3ML) 0.083% IN NEBU
2.5000 mg | INHALATION_SOLUTION | Freq: Four times a day (QID) | RESPIRATORY_TRACT | Status: DC | PRN
Start: 2021-12-04 — End: 2021-12-05

## 2021-12-04 MED ORDER — LIDOCAINE HCL 1 % IJ SOLN
INTRAMUSCULAR | Status: AC
  Filled 2021-12-04: qty 60

## 2021-12-04 MED ORDER — ATORVASTATIN CALCIUM 10 MG PO TABS
20.0000 mg | ORAL_TABLET | Freq: Every day | ORAL | Status: DC
  Administered 2021-12-05: 20 mg via ORAL
  Filled 2021-12-04: qty 2

## 2021-12-04 MED ORDER — FENTANYL CITRATE (PF) 100 MCG/2ML IJ SOLN
INTRAMUSCULAR | Status: AC
  Filled 2021-12-04: qty 2

## 2021-12-04 MED ORDER — POVIDONE-IODINE 10 % EX SWAB: 2.0000 | Freq: Once | CUTANEOUS | Status: DC

## 2021-12-04 MED ORDER — SODIUM CHLORIDE 0.9 % IV SOLN: INTRAVENOUS | Status: DC

## 2021-12-04 MED ORDER — FLUOXETINE HCL 20 MG PO CAPS
20.0000 mg | ORAL_CAPSULE | Freq: Every day | ORAL | Status: DC
  Administered 2021-12-05: 20 mg via ORAL
  Filled 2021-12-04: qty 1

## 2021-12-04 MED ORDER — CEFAZOLIN SODIUM-DEXTROSE 2-4 GM/100ML-% IV SOLN
2.0000 g | INTRAVENOUS | Status: AC
  Administered 2021-12-04: 2 g via INTRAVENOUS

## 2021-12-04 MED ORDER — ACETAMINOPHEN 325 MG PO TABS
325.0000 mg | ORAL_TABLET | ORAL | Status: DC | PRN
  Administered 2021-12-04: 650 mg via ORAL
  Filled 2021-12-04: qty 2

## 2021-12-04 MED ORDER — HEPARIN (PORCINE) IN NACL 1000-0.9 UT/500ML-% IV SOLN
INTRAVENOUS | Status: DC | PRN
  Administered 2021-12-04: 500 mL

## 2021-12-04 MED ORDER — ACETAMINOPHEN-CODEINE #3 300-30 MG PO TABS
1.0000 | ORAL_TABLET | Freq: Four times a day (QID) | ORAL | Status: DC | PRN
  Filled 2021-12-04: qty 1

## 2021-12-04 MED ORDER — SODIUM CHLORIDE 0.9 % IV SOLN
INTRAVENOUS | Status: AC
  Filled 2021-12-04: qty 2

## 2021-12-04 MED ORDER — HEPARIN (PORCINE) IN NACL 1000-0.9 UT/500ML-% IV SOLN
INTRAVENOUS | Status: AC
  Filled 2021-12-04: qty 500

## 2021-12-04 MED ORDER — ONDANSETRON HCL 4 MG/2ML IJ SOLN
4.0000 mg | Freq: Four times a day (QID) | INTRAMUSCULAR | Status: DC | PRN
Start: 2021-12-04 — End: 2021-12-05

## 2021-12-04 MED ORDER — SODIUM CHLORIDE 0.9 % IV SOLN
80.0000 mg | INTRAVENOUS | Status: AC
  Administered 2021-12-04: 80 mg

## 2021-12-04 MED ORDER — CHLORHEXIDINE GLUCONATE 4 % EX LIQD: Freq: Once | CUTANEOUS | Status: DC

## 2021-12-04 MED ORDER — MIDAZOLAM HCL 5 MG/5ML IJ SOLN
INTRAMUSCULAR | Status: AC
  Filled 2021-12-04: qty 5

## 2021-12-04 MED ORDER — SACUBITRIL-VALSARTAN 24-26 MG PO TABS
1.0000 | ORAL_TABLET | Freq: Two times a day (BID) | ORAL | Status: DC
  Administered 2021-12-04 – 2021-12-05 (×2): 1 via ORAL
  Filled 2021-12-04 (×2): qty 1

## 2021-12-04 MED ORDER — FENTANYL CITRATE (PF) 100 MCG/2ML IJ SOLN
INTRAMUSCULAR | Status: DC | PRN
  Administered 2021-12-04: 25 ug via INTRAVENOUS
  Administered 2021-12-04: 12.5 ug via INTRAVENOUS

## 2021-12-04 MED ORDER — LIDOCAINE HCL (PF) 1 % IJ SOLN
INTRAMUSCULAR | Status: DC | PRN
  Administered 2021-12-04: 60 mL

## 2021-12-04 MED ORDER — IOHEXOL 350 MG/ML SOLN
INTRAVENOUS | Status: DC | PRN
  Administered 2021-12-04: 20 mL

## 2021-12-04 SURGICAL SUPPLY — 15 items
CABLE SURGICAL S-101-97-12 (CABLE) ×2 IMPLANT
CATH ATTAIN COM SURV 6250V-EH (CATHETERS) ×1 IMPLANT
LEAD QUARTET 1458Q-86CM (Lead) ×1 IMPLANT
LEAD TENDRIL MRI 52CM LPA1200M (Lead) ×1 IMPLANT
LEAD TENDRIL MRI 58CM LPA1200M (Lead) ×1 IMPLANT
PACEMAKER QUDR ALLR CRT PM3562 (Pacemaker) IMPLANT
PAD DEFIB RADIO PHYSIO CONN (PAD) ×2 IMPLANT
PMKR QUADRA ALLURE CRT PM3562 (Pacemaker) ×2 IMPLANT
SHEATH 8FR PRELUDE SNAP 13 (SHEATH) ×2 IMPLANT
SHEATH 9FR PRELUDE SNAP 13 (SHEATH) ×1 IMPLANT
SHEATH PROBE COVER 6X72 (BAG) ×1 IMPLANT
SLITTER 6232ADJ (MISCELLANEOUS) ×1 IMPLANT
TRAY PACEMAKER INSERTION (PACKS) ×2 IMPLANT
WIRE ACUITY WHISPER EDS 4648 (WIRE) ×1 IMPLANT
WIRE HI TORQ VERSACORE-J 145CM (WIRE) ×1 IMPLANT

## 2021-12-05 ENCOUNTER — Ambulatory Visit (HOSPITAL_COMMUNITY): Payer: Medicare Other

## 2021-12-05 ENCOUNTER — Encounter (HOSPITAL_COMMUNITY): Payer: Self-pay | Admitting: Cardiology

## 2021-12-05 DIAGNOSIS — I428 Other cardiomyopathies: Secondary | ICD-10-CM | POA: Diagnosis not present

## 2021-12-05 DIAGNOSIS — I495 Sick sinus syndrome: Secondary | ICD-10-CM | POA: Diagnosis not present

## 2021-12-05 DIAGNOSIS — I447 Left bundle-branch block, unspecified: Secondary | ICD-10-CM | POA: Diagnosis not present

## 2021-12-05 DIAGNOSIS — I4891 Unspecified atrial fibrillation: Secondary | ICD-10-CM

## 2021-12-05 DIAGNOSIS — I480 Paroxysmal atrial fibrillation: Secondary | ICD-10-CM | POA: Diagnosis not present

## 2021-12-05 MED ORDER — AMIODARONE HCL 200 MG PO TABS: 200.0000 mg | ORAL_TABLET | Freq: Every day | ORAL | 6 refills | Status: DC

## 2021-12-05 MED ORDER — AMIODARONE HCL 200 MG PO TABS: 400.0000 mg | ORAL_TABLET | Freq: Every day | ORAL | 0 refills | Status: DC

## 2021-12-05 MED ORDER — RIVAROXABAN 20 MG PO TABS: 20.0000 mg | ORAL_TABLET | Freq: Every day | ORAL | 3 refills | Status: DC

## 2021-12-05 NOTE — Progress Notes (Signed)
Went over discharge paperwork with patient and daughter. Answered all questions. PIV removed, all belongings at bedside.

## 2021-12-06 ENCOUNTER — Encounter: Payer: Self-pay | Admitting: Cardiology

## 2021-12-06 NOTE — Telephone Encounter (Signed)
The patient daughter called stating the patient could feel her heart pulsing last night. I had them send a manual transmission.

## 2021-12-06 NOTE — Telephone Encounter (Signed)
Spoke with patient and patients daughter informed them what patient if feeling is called phrenic nerve, this is common in patients who have an LV lead patient and patients daughter stated that they were able to get patient to a comfortable position last night to where she was not feeling the sensation they stated that she has not felt that sensation during activity today, offered patient and patients daughter sooner appointment if the phrenic nerve stimulation  was worrisome/bothersome they stated would wait until apt on 12/14/21 wound check

## 2021-12-14 ENCOUNTER — Ambulatory Visit (INDEPENDENT_AMBULATORY_CARE_PROVIDER_SITE_OTHER): Payer: Medicare Other

## 2021-12-14 DIAGNOSIS — I420 Dilated cardiomyopathy: Secondary | ICD-10-CM | POA: Diagnosis not present

## 2021-12-14 DIAGNOSIS — I447 Left bundle-branch block, unspecified: Secondary | ICD-10-CM | POA: Diagnosis not present

## 2021-12-14 NOTE — Patient Instructions (Signed)
   After Your Pacemaker   Monitor your pacemaker site for redness, swelling, and drainage. Call the device clinic at [phone] if you experience these symptoms or fever/chills.  Your incision was closed with Steri-strips or staples:  You may shower 7 days after your procedure and wash your incision with soap and water. Avoid lotions, ointments, or perfumes over your incision until it is well-healed.  You may use a hot tub or a pool after your wound check appointment if the incision is completely closed.  Do not lift, push or pull greater than 10 pounds with the affected arm until 6 weeks after your procedure. There are no other restrictions in arm movement after your wound check appointment. Until after [facility].   You may drive, unless driving has been restricted by your healthcare providers.  Your Pacemaker is MRI compatible.  Remote monitoring is used to monitor your pacemaker from home. This monitoring is scheduled every 91 days by our office. It allows [place] to keep an eye on the functioning of your device to ensure it is working properly. You will routinely see your Electrophysiologist annually (more often if necessary).

## 2021-12-17 LAB — CUP PACEART INCLINIC DEVICE CHECK
Battery Remaining Longevity: 96 mo
Battery Voltage: 3.05 V
Brady Statistic RA Percent Paced: 0 %
Brady Statistic RV Percent Paced: 27 %
Date Time Interrogation Session: 20230706162700
Implantable Lead Implant Date: 20230626
Implantable Lead Implant Date: 20230626
Implantable Lead Implant Date: 20230626
Implantable Lead Location: 753858
Implantable Lead Location: 753859
Implantable Lead Location: 753860
Implantable Pulse Generator Implant Date: 20230626
Lead Channel Impedance Value: 462.5 Ohm
Lead Channel Impedance Value: 575 Ohm
Lead Channel Impedance Value: 687.5 Ohm
Lead Channel Pacing Threshold Amplitude: 0.75 V
Lead Channel Pacing Threshold Amplitude: 1 V
Lead Channel Pacing Threshold Pulse Width: 0.5 ms
Lead Channel Pacing Threshold Pulse Width: 0.5 ms
Lead Channel Sensing Intrinsic Amplitude: 1.2 mV
Lead Channel Sensing Intrinsic Amplitude: 12 mV
Lead Channel Setting Pacing Amplitude: 3.5 V
Lead Channel Setting Pacing Amplitude: 3.5 V
Lead Channel Setting Pacing Amplitude: 3.5 V
Lead Channel Setting Pacing Pulse Width: 0.5 ms
Lead Channel Setting Pacing Pulse Width: 0.5 ms
Lead Channel Setting Sensing Sensitivity: 2 mV
Pulse Gen Model: 3562
Pulse Gen Serial Number: 8071621

## 2021-12-17 NOTE — Progress Notes (Signed)
Wound check appointment s/p Bi-V PPM implant 12/04/21. Steri-strips removed. Wound without redness or edema. Incision edges approximated, wound well healed. Normal device function. Thresholds, sensing, and impedances consistent with implant measurements. AT/AF burden 99%. +OAC. DCCV in 4 weeks. Bi-V pacing percentage low(27%) secondary to AT/AF burden. Device programmed at 3.5V/auto capture programmed on for extra safety margin until 3 month visit. Discussed V rate with Dr. Quentin Ore with no recommendations for change in therapy at this time. Histogram distribution appropriate for patient and level of activity.  Patient enrolled in remote monitoring with next remote scheduled 03/06/22. 91 day follow up with Dr. Quentin Ore 10/623. Patient educated about wound care, arm mobility, lifting restrictions.

## 2021-12-18 ENCOUNTER — Ambulatory Visit: Payer: Medicare Other | Admitting: Cardiology

## 2021-12-22 ENCOUNTER — Telehealth: Payer: Self-pay

## 2021-12-22 ENCOUNTER — Ambulatory Visit (INDEPENDENT_AMBULATORY_CARE_PROVIDER_SITE_OTHER): Payer: Medicare Other

## 2021-12-22 DIAGNOSIS — I4891 Unspecified atrial fibrillation: Secondary | ICD-10-CM

## 2021-12-22 LAB — CUP PACEART INCLINIC DEVICE CHECK
Battery Remaining Longevity: 105 mo
Battery Voltage: 3.05 V
Brady Statistic RA Percent Paced: 0 %
Brady Statistic RV Percent Paced: 26 %
Date Time Interrogation Session: 20230714145500
Implantable Lead Implant Date: 20230626
Implantable Lead Implant Date: 20230626
Implantable Lead Implant Date: 20230626
Implantable Lead Location: 753858
Implantable Lead Location: 753859
Implantable Lead Location: 753860
Implantable Pulse Generator Implant Date: 20230626
Lead Channel Impedance Value: 462.5 Ohm
Lead Channel Impedance Value: 575 Ohm
Lead Channel Impedance Value: 700 Ohm
Lead Channel Pacing Threshold Amplitude: 0.5 V
Lead Channel Pacing Threshold Amplitude: 0.875 V
Lead Channel Pacing Threshold Pulse Width: 0.5 ms
Lead Channel Pacing Threshold Pulse Width: 0.5 ms
Lead Channel Sensing Intrinsic Amplitude: 12 mV
Lead Channel Sensing Intrinsic Amplitude: 3.6 mV
Lead Channel Setting Pacing Amplitude: 2 V
Lead Channel Setting Pacing Amplitude: 3.5 V
Lead Channel Setting Pacing Amplitude: 3.5 V
Lead Channel Setting Pacing Pulse Width: 0.5 ms
Lead Channel Setting Pacing Pulse Width: 0.5 ms
Lead Channel Setting Sensing Sensitivity: 2 mV
Pulse Gen Model: 3562
Pulse Gen Serial Number: 8071621

## 2021-12-22 NOTE — Telephone Encounter (Signed)
Pt states she her pacemaker is pulsing. I spoke with Truddie Crumble and she told me to put her on the schedule today.

## 2021-12-22 NOTE — Progress Notes (Signed)
Pacemaker check in clinic after patient's daughter calls and states patients "side is occasionally jumping". Testing for Stim. Normal device function. Thresholds, sensing, impedances consistent with previous measurements. Device programmed to maximize longevity. AT/AF burden 99% with low bi-V pacing and poorly controlled V rates. Patient asymptomatic. Dr. Quentin Ore aware. Action plan in place. LV stim at high voltage. Discussed with industry. Multiple vectors tested with noted stim. Left LV programmed as at implant however turned on autocap per industry recommendation. Patient is advised to call clinic if stim returns. Device programmed at appropriate safety margins for acute leads. Histogram distribution appropriate for patient activity level. Patient enrolled in remote follow-up. Patient education completed.

## 2022-01-25 ENCOUNTER — Ambulatory Visit (INDEPENDENT_AMBULATORY_CARE_PROVIDER_SITE_OTHER): Payer: Medicare Other | Admitting: Cardiology

## 2022-01-25 ENCOUNTER — Encounter: Payer: Self-pay | Admitting: Cardiology

## 2022-01-25 VITALS — BP 116/78 | HR 89 | Ht 62.2 in | Wt 174.0 lb

## 2022-01-25 DIAGNOSIS — I4819 Other persistent atrial fibrillation: Secondary | ICD-10-CM

## 2022-01-25 DIAGNOSIS — I447 Left bundle-branch block, unspecified: Secondary | ICD-10-CM | POA: Diagnosis not present

## 2022-01-25 DIAGNOSIS — E782 Mixed hyperlipidemia: Secondary | ICD-10-CM

## 2022-01-25 DIAGNOSIS — I420 Dilated cardiomyopathy: Secondary | ICD-10-CM | POA: Diagnosis not present

## 2022-01-25 NOTE — Patient Instructions (Signed)

## 2022-01-25 NOTE — Progress Notes (Signed)
Cardiology Office Note:    Date:  01/25/2022   ID:  Sheila Schmidt, DOB 1936/05/01, MRN [id number]  PCP:  Darreld Mclean, MD  Cardiologist:  Jenne Campus, MD    Referring MD: Darreld Mclean, MD   No chief complaint on file. I am doing for  History of Present Illness:    Sheila Schmidt is a 86 y.o. female with past medical history significant for paroxysmal atrial fibrillation, atrial flutter, history of cardiomyopathy with last estimation in June with ejection fraction about 30%, status post BiV pacemaker done in June, also tachybradycardia phenomenon, she comes to my office today for follow-up.  She says she does not feel that good.  Still complain of having weakness fatigue tiredness and shortness of breath.  She cannot tell me if pacemaker made any difference.  Past Medical History:  Diagnosis Date   Cancer ([place])     Left breast carcinoma in situ   Cholelithiasis    Depression    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   Hx of adenomatous colonic polyps    Hyperlipidemia     Past Surgical History:  Procedure Laterality Date   ATRIAL FLUTTER ABLATION     BIV PACEMAKER INSERTION CRT-P N/A 12/04/2021   Procedure: BIV PACEMAKER INSERTION CRT-P;  Surgeon: Vickie Epley, MD;  Location: [facility];  Service: Cardiovascular;  Laterality: N/A;   BREAST EXCISIONAL BIOPSY Left 2010   benign   CARDIOVERSION  05/2020   in [place] N/A 06/21/2021   Procedure: CARDIOVERSION;  Surgeon: Geralynn Rile, MD;  Location: [facility];  Service: Cardiovascular;  Laterality: N/A;   CHOLECYSTECTOMY     CHOLECYSTECTOMY, LAPAROSCOPIC  '06   [place] ARTHROSCOPY     Left '00 [place]/ Right '08   lumpectomy- remote     benign   TOOTH EXTRACTION     TOTAL KNEE ARTHROPLASTY  02/05/2011   left    Current Medications: Current Meds  Medication Sig   acetaminophen (TYLENOL) 500 MG tablet Take 1,000 mg by mouth every 8 (eight) hours  as needed for moderate pain.   acetaminophen-codeine (TYLENOL #3) 300-30 MG tablet Take 1 tablet by mouth every 6 (six) hours as needed for moderate pain.   albuterol (VENTOLIN HFA) 108 (90 Base) MCG/ACT inhaler Inhale 2 puffs into the lungs every 6 (six) hours as needed for wheezing or shortness of breath.   amiodarone (PACERONE) 200 MG tablet Take 1 tablet (200 mg total) by mouth daily.   atorvastatin (LIPITOR) 20 MG tablet Take 1 tablet (20 mg total) by mouth daily.   Calcium Carbonate-Vit D-Min (CALTRATE 600+D PLUS MINERALS) 600-800 MG-UNIT CHEW Chew 1 each by mouth daily.   Cholecalciferol 50 MCG (2000 UT) TABS Take 2,000 Units by mouth daily.   famotidine (PEPCID) 40 MG tablet Take 40 mg by mouth daily as needed for heartburn or indigestion.   FLUoxetine (PROZAC) 20 MG capsule Take 1 capsule (20 mg total) by mouth daily.   melatonin 5 MG TABS Take 5 mg by mouth at bedtime.   rivaroxaban (XARELTO) 20 MG TABS tablet Take 1 tablet (20 mg total) by mouth daily with supper.   sacubitril-valsartan (ENTRESTO) 24-26 MG Take 1 tablet by mouth 2 (two) times daily.   vitamin B-12 (CYANOCOBALAMIN) 100 MCG tablet Take 100 mcg by mouth daily.     Allergies:   Patient has no known allergies.   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. Married '59, 3 daughters- '61, '64, '73; 2 grandchildren. Exercise - goes to  gym 5/wk. ACP - directed to the [email or web address]   Lives with her daughter - goes between daughters home in [place] and her own home here   Social Determinants of Health   Financial Resource Strain: Low Risk  (02/12/2021)   Overall Financial Resource Strain (CARDIA)    Difficulty of Paying Living Expenses: Not hard at all  Food Insecurity: No Food Insecurity (02/12/2021)   Hunger Vital Sign    Worried About Running Out of Food in the Last Year: Never true    [place] in the Last Year: Never true  Transportation Needs: No Transportation Needs (02/12/2021)   PRAPARE - Hydrologist (Medical): No    Lack of Transportation (Non-Medical): No  Physical Activity: Insufficiently Active (02/12/2021)   Exercise Vital Sign    Days of Exercise per Week: 7 days    Minutes of Exercise per Session: 20 min  Stress: No Stress Concern Present (02/12/2021)   [place]    Feeling of Stress : Not at all  Social Connections: Socially Isolated (02/12/2021)   Social Connection and Isolation Panel [NHANES]    Frequency of Communication with Friends and Family: More than three times a week    Frequency of Social Gatherings with Friends and Family: More than three times a week    Attends Religious Services: Never    Marine scientist or Organizations: No    Attends Archivist Meetings: Never    Marital Status: Widowed     Family History: The patient's family history includes Cancer in her father; Diabetes in her mother; Heart attack in her mother; Heart disease in her mother and sister. There is no history of Breast cancer or Colon cancer. ROS:   Please see the history of present illness.    All 14 point review of systems negative except as described per history of present illness  EKGs/Labs/Other Studies Reviewed:      Recent Labs: 05/22/2021: ALT 15 10/12/2021: Magnesium  1.8 11/20/2021: Hemoglobin 13.9; Platelets 190.0; TSH 2.92 11/23/2021: BUN 13; Creatinine, Ser 0.91; Potassium 4.3; Sodium 140  Recent Lipid Panel    Component Value Date/Time   CHOL 234 (H) 03/31/2018 1427   TRIG 89.0 03/31/2018 1427   HDL 58.20 03/31/2018 1427   CHOLHDL 4 03/31/2018 1427   VLDL 17.8 03/31/2018 1427   LDLCALC 158 (H) 03/31/2018 1427    Physical Exam:    VS:  BP 116/78 (BP Location: Right Arm, Patient Position: Sitting)   Pulse 89   Ht 5' 2.2" (1.58 m)   Wt 174 lb (78.9  kg)   SpO2 96%   BMI 31.62 kg/m     Wt Readings from Last 3 Encounters:  01/25/22 174 lb (78.9 kg)  12/04/21 170 lb (77.1 kg)  11/23/21 175 lb 3.2 oz (79.5 kg)     GEN:  Well nourished, well developed in no acute distress HEENT: Normal NECK: No JVD; No carotid bruits LYMPHATICS: No lymphadenopathy CARDIAC: RRR, no murmurs, no rubs, no gallops RESPIRATORY:  Clear to auscultation without rales, wheezing or rhonchi  ABDOMEN: Soft, non-tender, non-distended MUSCULOSKELETAL:  No edema; No deformity  SKIN: Warm and dry LOWER EXTREMITIES: no swelling NEUROLOGIC:  Alert and oriented x 3 PSYCHIATRIC:  Normal affect   ASSESSMENT:    1. Left bundle branch block   2. Dilated cardiomyopathy ([place])   3. Persistent atrial fibrillation ([place])   4. Mixed hyperlipidemia    PLAN:    In order of problems listed above:  Dilated cardiomyopathy with ejection fraction 30%, status post BiV pacing, I will make arrangements for echocardiogram to be repeated to recheck left ventricle ejection fraction.  In the meantime she is already on Entresto in the future if ejection fraction still diminished then will initiate beta-blocker. Atrial fibrillation with seems to be a problem she appears to be in atrial fibrillation all the time since the time of implant.  She was recently initiated with amiodarone may be feeling elevated better but no convincing her EKG today did not show sinus rhythm and look like still since  she is in atrial fibrillation.  Continue anticoagulation.  I think the long-term plan should be interrogation of her device to see if she got paroxysmal atrial fibrillation if not if she is permanently in atrial fibrillation over the next few weeks we should make arrangements for electrical cardioversion. Mixed dyslipidemia she is taking Lipitor 20 which I will continue.  I will schedule her to have fasting lipid profile done. Pacemaker present: I did review interrogation of the device 100% atrial fibrillation.   Medication Adjustments/Labs and Tests Ordered: Current medicines are reviewed at length with the patient today.  Concerns regarding medicines are outlined above.  Orders Placed This Encounter  Procedures   EKG 12-Lead   Medication changes: No orders of the defined types were placed in this encounter.   Signed, Park Liter, MD, [facility] 01/25/2022 4:46 PM    [place]

## 2022-01-26 ENCOUNTER — Telehealth: Payer: Self-pay

## 2022-01-26 NOTE — Telephone Encounter (Signed)
I spoke with the patient daughter and helped her send a manual transmission. She would like for the nurse to give her a call back. She has some questions about an episode the patient had.

## 2022-01-26 NOTE — Telephone Encounter (Signed)
Called patient to send a manual transmission requested by Dr. Agustin Cree. No answer, Left detailed message to send remote transmission from monitor and if she needs assistance to call the DC. Direct phone number left on VM. HIPPA compliance per DPR to do so on VM.

## 2022-01-26 NOTE — Telephone Encounter (Signed)
-----   Message from Stanton Kidney, RN sent at 01/26/2022 10:33 AM EDT -----  ----- Message ----- From: Park Liter, MD Sent: 01/25/2022   4:51 PM EDT To: Stanton Kidney, RN  Judeen Hammans, do mind to make arrangements for remote interrogation of the device of this lady.  She does have paroxysmal atrial fibrillation she was recently loaded with amiodarone I am trying to figure out if we can continue loading with amiodarone or convert her electrically. I appreciate Herbie Baltimore

## 2022-01-26 NOTE — Telephone Encounter (Signed)
Remote transmission received and reviewed. Normal device function. Presenting rhythm, AF with controlled VR. AT/AF burden 97%. Spoke to patient and updated on remote transmission results. Appreciative for f/u call.

## 2022-02-13 ENCOUNTER — Encounter: Payer: Self-pay | Admitting: Family Medicine

## 2022-02-13 DIAGNOSIS — M79644 Pain in right finger(s): Secondary | ICD-10-CM

## 2022-02-14 NOTE — Progress Notes (Signed)
[facility] at [facility] [address], [place], [place] [address] [phone]  Date:  02/15/2022   Name:  Sheila Schmidt   DOB:  February 08, 1936   MRN:  [id number]  PCP:  Darreld Mclean, MD    Chief Complaint: [place] ([place] She had a Bruise on left knee, bump between eyes and bruises on my right hand)   History of Present Illness:  Sheila Schmidt is a 86 y.o. very pleasant female patient who presents with the following:  Patient seen today with concern of a recent fall Most recent visit with myself was in June History of atrial fibrillation treated with rate control and anticoagulation, hyperlipidemia, osteopenia, breast cancer She also got a pacer placed in June of this year  Her husband died within the last couple of years She splits time between [place] [facility]  See most recent by her cardiologist, Dr. Raliegh Ip about 3 weeks ago-continued atrial fibs, plan for long-term anticoagulation.  Dilated cardiomyopathy with a EF of about 30%, on Entresto  She contacted me recently with the following: [place]. Bruise on left knee, bump between eyes and bruises on my right hand. The swelling has gone down on my right hand but my thumb is still swollen and it hurts. Do you think I need an exray ?  Pt tripped and fell over a loose cord - she scraped her nose, skinned her left knee and injured her right hand Accident occurred 02/06/22 Her knee and her facial abrasion are better, no headaches.  However, her right thumb continues to hurt.  It is swollen and bruised  Otherwise she feels well today Accompanied by her daughter Patient Active Problem List   Diagnosis Date Noted   Atrial fibrillation ([place]) 12/04/2021   Cancer ([place]) 05/25/2021   Left bundle branch block 04/12/2021   Dilated cardiomyopathy ([place]) 04/12/2021   Vasculitis ([place]) 02/22/2021   Anxiety state  02/12/2021   Body fluid retention 02/12/2021   Cervical intraepithelial neoplasia 02/12/2021   Insomnia 02/12/2021   Pruritus of vulva 02/12/2021   Leg wound, right 10/03/2020   Bilateral primary osteoarthritis of knee 03/30/2019   Paroxysmal atrial fibrillation ([place]) 04/07/2015   Chronic anticoagulation 04/07/2015   Paroxysmal atrial flutter ([place]) 03/16/2015   Essential hypertension, benign 02/24/2014   Sciatica 12/08/2013   Hyperlipidemia 09/25/2013   Osteopenia 11/19/2012   Low back pain 01/26/2010   Carcinoma in situ of breast 09/04/2009   DEPRESSION, PROLONGED 09/04/2009   DEGENERATIVE JOINT DISEASE, KNEES, BILATERAL 09/04/2009   PERIPHERAL EDEMA 09/04/2009   COLONIC POLYPS, ADENOMATOUS, HX OF 09/04/2009   Malignant tumor of breast ([place]) 06/11/2009    Past Medical History:  Diagnosis Date   Cancer ([place])     Left breast carcinoma in situ   Cholelithiasis    Depression    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   Hx of adenomatous colonic polyps    Hyperlipidemia     Past Surgical History:  Procedure Laterality Date   ATRIAL FLUTTER ABLATION     BIV PACEMAKER INSERTION CRT-P N/A 12/04/2021   Procedure: BIV PACEMAKER INSERTION CRT-P;  Surgeon: Vickie Epley, MD;  Location: [facility];  Service: Cardiovascular;  Laterality: N/A;   BREAST EXCISIONAL BIOPSY Left 2010   benign   CARD[facility]OVERSION  05/2020   in [facility]  CARDIOVERSION N/A 06/21/2021   Procedure: CARDIOVERSION;  Surgeon: Geralynn Rile, MD;  Location: [facility];  Service: Cardiovascular;  Laterality: N/A;   CHOLECYSTECTOMY     CHOLECYSTECTOMY, LAPAROSCOPIC  '06   [place] ARTHROSCOPY     Left '00 [place]/ Right '08   lumpectomy- remote     benign   TOOTH EXTRACTION     TOTAL KNEE ARTHROPLASTY  02/05/2011   left    Social History   Tobacco Use   Smoking status: Never    Passive exposure: Never   Smokeless tobacco: Never  Vaping Use   Vaping Use: Never used   Substance Use Topics   Alcohol use: Yes    Comment: 1 glass per day   Drug use: No    Family History  Problem Relation Age of Onset   Diabetes Mother        Deceased in 61s   Heart disease Mother        cad/MI-fatal   Heart attack Mother    Cancer Father        liver, Deceased in 40s   Heart disease Sister    Breast cancer Neg Hx    Colon cancer Neg Hx     No Known Allergies  Medication list has been reviewed and updated.  Current Outpatient Medications on File Prior to Visit  Medication Sig Dispense Refill   acetaminophen (TYLENOL) 500 MG tablet Take 1,000 mg by mouth every 8 (eight) hours as needed for moderate pain.     acetaminophen-codeine (TYLENOL #3) 300-30 MG tablet Take 1 tablet by mouth every 6 (six) hours as needed for moderate pain. 30 tablet 0   albuterol (VENTOLIN HFA) 108 (90 Base) MCG/ACT inhaler Inhale 2 puffs into the lungs every 6 (six) hours as needed for wheezing or shortness of breath. 18 g 6   amiodarone (PACERONE) 200 MG tablet Take 1 tablet (200 mg total) by mouth daily. 30 tablet 6   atorvastatin (LIPITOR) 20 MG tablet Take 1 tablet (20 mg total) by mouth daily. 90 tablet 3   Calcium Carbonate-Vit D-Min (CALTRATE 600+D PLUS MINERALS) 600-800 MG-UNIT CHEW Chew 1 each by mouth daily.     Cholecalciferol 50 MCG (2000 UT) TABS Take 2,000 Units by mouth daily.     famotidine (PEPCID) 40 MG tablet Take 40 mg by mouth daily as needed for heartburn or indigestion.     FLUoxetine (PROZAC) 20 MG capsule Take 1 capsule (20 mg total) by mouth daily. 90 capsule 3   melatonin 5 MG TABS Take 5 mg by mouth at bedtime.     rivaroxaban (XARELTO) 20 MG TABS tablet Take 1 tablet (20 mg total) by mouth daily with supper. 90 tablet 3   sacubitril-valsartan (ENTRESTO) 24-26 MG Take 1 tablet by mouth 2 (two) times daily. 180 tablet 3   vitamin B-12 (CYANOCOBALAMIN) 100 MCG tablet Take 100 mcg by mouth daily.     No current facility-administered medications on file prior to  visit.    Review of Systems:  As per HPI- otherwise negative.   Physical Examination: Vitals:   02/15/22 0958  BP: 132/84  Pulse: (!) 103  Resp: 18  Temp: 97.7 F (36.5 C)  SpO2: 95%   Vitals:   02/15/22 0958  Weight: 180 lb (81.6 kg)  Height: '5\' 2"'$  (1.575 m)   Body mass index is 32.92 kg/m. Ideal Body Weight: Weight in (lb) to have BMI = 25: 136.4  GEN: no acute distress.  Minimal overweight, looks well HEENT: Atraumatic, Normocephalic.  Ears and Nose: No external deformity. CV: RRR, No M/G/R. No JVD. No thrill. No extra heart sounds. PULM: CTA B, no wheezes, crackles, rhonchi. No retractions. No resp. distress. No accessory muscle use. EXTR: No c/c/e PSYCH: Normally interactive. Conversant.  Right hand: There is bruising and swelling of the right thumb.  It is tender to palpation, tender with movement at IP and MCP joints Left knee; healing abrasion, no particular tenderness with palpation or range of motion  DG Hand Complete Right  Result Date: 02/15/2022 CLINICAL DATA:  Status post fall.  Thumb pain. EXAM: RIGHT HAND - COMPLETE 3+ VIEW COMPARISON:  None Available. FINDINGS: There is an acute, transverse fracture the or mid E involving the proximal shaft of the first distal phalanx. The fracture fragments are in near anatomic alignment. Volar subluxation of the third MCP joint is identified which may reflect a chronic deformity. Marked degenerative changes are noted predominantly affecting the DIP joints with joint space narrowing and exuberant marginal spur formation. Bones appear mildly osteopenic. IMPRESSION: 1. Acute, transverse fracture involves the proximal shaft of the first distal phalanx. 2. Volar subluxation of the third MCP joint. 3. Advanced osteoarthritis. Electronically Signed   By: Kerby Moors M.D.   On: 02/15/2022 10:41    Assessment and Plan: Displaced fracture of distal phalanx of right thumb, initial encounter for closed fracture  Patient seen today  with concern of a recent fall and injury to her right thumb.  We obtained x-rays of her thumb as above, she does have a fracture Dr. Raeford Razor at sports medicine was able to see patient today, appreciate his care-sent down to his office directly from my office  Signed Lamar Blinks, MD

## 2022-02-14 NOTE — Addendum Note (Signed)
Addended by: Lamar Blinks C on: 02/14/2022 12:31 PM   Modules accepted: Orders

## 2022-02-15 ENCOUNTER — Ambulatory Visit: Payer: Medicare Other | Admitting: Family Medicine

## 2022-02-15 ENCOUNTER — Ambulatory Visit (HOSPITAL_BASED_OUTPATIENT_CLINIC_OR_DEPARTMENT_OTHER)
Admission: RE | Admit: 2022-02-15 | Discharge: 2022-02-15 | Disposition: A | Discharge status: Home / Self Care | Payer: Medicare Other | Source: Ambulatory Visit | Attending: Family Medicine | Admitting: Family Medicine

## 2022-02-15 ENCOUNTER — Encounter: Payer: Self-pay | Admitting: Family Medicine

## 2022-02-15 ENCOUNTER — Ambulatory Visit (INDEPENDENT_AMBULATORY_CARE_PROVIDER_SITE_OTHER): Payer: Medicare Other | Admitting: Family Medicine

## 2022-02-15 VITALS — BP 132/84 | Ht 62.0 in | Wt 180.0 lb

## 2022-02-15 VITALS — BP 132/84 | HR 70 | Temp 97.7°F | Resp 18 | Ht 62.0 in | Wt 180.0 lb

## 2022-02-15 DIAGNOSIS — S62521A Displaced fracture of distal phalanx of right thumb, initial encounter for closed fracture: Secondary | ICD-10-CM | POA: Diagnosis not present

## 2022-02-15 DIAGNOSIS — M79644 Pain in right finger(s): Secondary | ICD-10-CM | POA: Diagnosis present

## 2022-02-15 DIAGNOSIS — S62524A Nondisplaced fracture of distal phalanx of right thumb, initial encounter for closed fracture: Secondary | ICD-10-CM | POA: Diagnosis not present

## 2022-02-15 NOTE — Progress Notes (Signed)
  Sheila Schmidt - 86 y.o. female MRN [id number]  Date of birth: 07/03/1935  SUBJECTIVE:  Including CC & ROS.  No chief complaint on file.   Sheila Schmidt is a 86 y.o. female that is  presenting with acute right thumb pain.  She is having swelling and pain at the distal phalanx of the right thumb.  She recently fell at the [employer] [facility] [place]..   Independent review of the right hand x-ray from today shows a transverse fracture of the proximal phalanx of the thumb.  Review of Systems See HPI   HISTORY: Past Medical, Surgical, Social, and Family History Reviewed & Updated per EMR.   Pertinent Historical Findings include:  Past Medical History:  Diagnosis Date   Cancer ([place])     Left breast carcinoma in situ   Cholelithiasis    Depression    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   Hx of adenomatous colonic polyps    Hyperlipidemia     Past Surgical History:  Procedure Laterality Date   ATRIAL FLUTTER ABLATION     BIV PACEMAKER INSERTION CRT-P N/A 12/04/2021   Procedure: BIV PACEMAKER INSERTION CRT-P;  Surgeon: Vickie Epley, MD;  Location: [facility];  Service: Cardiovascular;  Laterality: N/A;   BREAST EXCISIONAL BIOPSY Left 2010   benign   CARDIOVERSION  05/2020   in [place] N/A 06/21/2021   Procedure: CARDIOVERSION;  Surgeon: Geralynn Rile, MD;  Location: [facility];  Service: Cardiovascular;  Laterality: N/A;   CHOLECYSTECTOMY     CHOLECYSTECTOMY, LAPAROSCOPIC  '06   [place] ARTHROSCOPY     Left '00 [place]/ Right '08   lumpectomy- remote     benign   TOOTH EXTRACTION     TOTAL KNEE ARTHROPLASTY  02/05/2011   left     PHYSICAL EXAM:  VS: BP 132/84   Ht '5\' 2"'$  (1.575 m)   Wt 180 lb (81.6 kg)   BMI 32.92 kg/m  Physical Exam Gen: NAD, alert, cooperative with exam, well-appearing MSK:  Neurovascularly intact       ASSESSMENT & PLAN:   Closed nondisplaced fracture of distal phalanx of right  thumb Acutely occurring.  [place]  Fracture appreciated on x-ray today. -Counseled on supportive care. -placed In a U-shaped splint today. -Follow-up in 3 weeks.

## 2022-02-15 NOTE — Patient Instructions (Signed)
Nice to meet you Please use the splint  Please use tylenol as needed   Please send me a message in MyChart with any questions or updates.  Please see me back in 3 weeks.   --Dr. Raeford Razor

## 2022-02-15 NOTE — Assessment & Plan Note (Signed)
Acutely occurring.  [place]  Fracture appreciated on x-ray today. -Counseled on supportive care. -placed In a U-shaped splint today. -Follow-up in 3 weeks.

## 2022-02-15 NOTE — Patient Instructions (Signed)
Please go to sports med [address] for your thumb fracture!

## 2022-02-22 ENCOUNTER — Telehealth: Payer: Self-pay | Admitting: Family Medicine

## 2022-02-22 ENCOUNTER — Ambulatory Visit: Payer: Medicare Other | Admitting: Cardiology

## 2022-02-22 NOTE — Telephone Encounter (Signed)
Left message for patient to call back and schedule Medicare Annual Wellness Visit (AWV).   Please offer to do virtually or by telephone.  Left office number and my jabber [phone].  Last AWV:02/12/2021  Please schedule at anytime with Nurse Health Advisor.

## 2022-03-06 ENCOUNTER — Ambulatory Visit (INDEPENDENT_AMBULATORY_CARE_PROVIDER_SITE_OTHER): Payer: Medicare Other

## 2022-03-06 DIAGNOSIS — I495 Sick sinus syndrome: Secondary | ICD-10-CM

## 2022-03-06 LAB — CUP PACEART REMOTE DEVICE CHECK
Battery Remaining Longevity: 70 mo
Battery Remaining Percentage: 95.5 %
Battery Voltage: 2.99 V
Brady Statistic AP VP Percent: 0 %
Brady Statistic AP VS Percent: 0 %
Brady Statistic AS VP Percent: 32 %
Brady Statistic AS VS Percent: 61 %
Brady Statistic RA Percent Paced: 1 %
Date Time Interrogation Session: 20230926040008
Implantable Lead Implant Date: 20230626
Implantable Lead Implant Date: 20230626
Implantable Lead Implant Date: 20230626
Implantable Lead Location: 753858
Implantable Lead Location: 753859
Implantable Lead Location: 753860
Implantable Pulse Generator Implant Date: 20230626
Lead Channel Impedance Value: 440 Ohm
Lead Channel Impedance Value: 530 Ohm
Lead Channel Impedance Value: 840 Ohm
Lead Channel Pacing Threshold Amplitude: 0.5 V
Lead Channel Pacing Threshold Amplitude: 0.875 V
Lead Channel Pacing Threshold Pulse Width: 0.5 ms
Lead Channel Pacing Threshold Pulse Width: 0.5 ms
Lead Channel Sensing Intrinsic Amplitude: 12 mV
Lead Channel Sensing Intrinsic Amplitude: 2.2 mV
Lead Channel Setting Pacing Amplitude: 2 V
Lead Channel Setting Pacing Amplitude: 3.5 V
Lead Channel Setting Pacing Amplitude: 3.5 V
Lead Channel Setting Pacing Pulse Width: 0.5 ms
Lead Channel Setting Pacing Pulse Width: 0.5 ms
Lead Channel Setting Sensing Sensitivity: 2 mV
Pulse Gen Model: 3562
Pulse Gen Serial Number: 8071621

## 2022-03-07 ENCOUNTER — Ambulatory Visit (INDEPENDENT_AMBULATORY_CARE_PROVIDER_SITE_OTHER): Payer: Medicare Other | Admitting: Family Medicine

## 2022-03-07 ENCOUNTER — Encounter: Payer: Self-pay | Admitting: Family Medicine

## 2022-03-07 VITALS — BP 124/78 | Ht 62.0 in | Wt 180.0 lb

## 2022-03-07 DIAGNOSIS — M1711 Unilateral primary osteoarthritis, right knee: Secondary | ICD-10-CM

## 2022-03-07 DIAGNOSIS — C4490 Unspecified malignant neoplasm of skin, unspecified: Secondary | ICD-10-CM | POA: Insufficient documentation

## 2022-03-07 DIAGNOSIS — S62524D Nondisplaced fracture of distal phalanx of right thumb, subsequent encounter for fracture with routine healing: Secondary | ICD-10-CM

## 2022-03-07 NOTE — Progress Notes (Signed)
  Sheila Schmidt - 86 y.o. female MRN [id number]  Date of birth: 1935-06-28  SUBJECTIVE:  Including CC & ROS.  No chief complaint on file.   Sheila Schmidt is a 86 y.o. female that is following up for her right thumb fracture and acute on chronic right knee pain.  The thumb continues to have some swelling.  The pain is improving.  She has acute on chronic right knee pain.  She cannot receive an arthroplasty while she is currently in A-fib..   Review of Systems See HPI   HISTORY: Past Medical, Surgical, Social, and Family History Reviewed & Updated per EMR.   Pertinent Historical Findings include:  Past Medical History:  Diagnosis Date   Cancer ([place])     Left breast carcinoma in situ   Cholelithiasis    Depression    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   Hx of adenomatous colonic polyps    Hyperlipidemia     Past Surgical History:  Procedure Laterality Date   ATRIAL FLUTTER ABLATION     BIV PACEMAKER INSERTION CRT-P N/A 12/04/2021   Procedure: BIV PACEMAKER INSERTION CRT-P;  Surgeon: Vickie Epley, MD;  Location: [facility];  Service: Cardiovascular;  Laterality: N/A;   BREAST EXCISIONAL BIOPSY Left 2010   benign   CARDIOVERSION  05/2020   in [place] N/A 06/21/2021   Procedure: CARDIOVERSION;  Surgeon: Geralynn Rile, MD;  Location: [facility];  Service: Cardiovascular;  Laterality: N/A;   CHOLECYSTECTOMY     CHOLECYSTECTOMY, LAPAROSCOPIC  '06   [place] ARTHROSCOPY     Left '00 [place]/ Right '08   lumpectomy- remote     benign   TOOTH EXTRACTION     TOTAL KNEE ARTHROPLASTY  02/05/2011   left     PHYSICAL EXAM:  VS: BP 124/78 (BP Location: Right Arm, Patient Position: Sitting)   Ht '5\' 2"'$  (1.575 m)   Wt 180 lb (81.6 kg)   BMI 32.92 kg/m  Physical Exam Gen: NAD, alert, cooperative with exam, well-appearing MSK:  Neurovascularly intact       ASSESSMENT & PLAN:   OA (osteoarthritis) of knee Acute on  chronic in nature.  She has tried steroid injections in the past.  She cannot have arthroplasty until her A-fib is controlled  -Counseled on home exercise therapy and supportive care. -Pursue Zilretta injection  Closed nondisplaced fracture of distal phalanx of right thumb Acutely occurring.  Initial injury around 9/6.  Having improvement of her pain.  Still ongoing swelling. -Counseled on home exercise therapy and supportive care. -Counseled on using Coban

## 2022-03-07 NOTE — Patient Instructions (Signed)
Good to see you Please use the coban  You can consider the compression  We'll run the benefits for the zilretta  Please send me a message in MyChart with any questions or updates.  Please see me back for the injection.   --Dr. Raeford Razor

## 2022-03-07 NOTE — Assessment & Plan Note (Signed)
Acute on chronic in nature.  She has tried steroid injections in the past.  She cannot have arthroplasty until her A-fib is controlled  -Counseled on home exercise therapy and supportive care. -Pursue Zilretta injection

## 2022-03-07 NOTE — Assessment & Plan Note (Signed)
Acutely occurring.  Initial injury around 9/6.  Having improvement of her pain.  Still ongoing swelling. -Counseled on home exercise therapy and supportive care. -Counseled on using Coban

## 2022-03-09 ENCOUNTER — Telehealth: Payer: Self-pay

## 2022-03-09 NOTE — Telephone Encounter (Signed)
Call received from Pt's daughter.  She was concerned that Pt has had 2 dizzy spells in the last week.  One dizzy spell was in a restaurant when she stood up after eating.  The dizzy spell today was after sitting up after tying her shoes.  We discussed that these 2 spells sound like orthostatic hypotension, and advised Pt to change positions slowly.  Reviewed Pt's last transmission.  Pt continues to be in atrial fibrillation.  Variable ventricular response.  Pt has follow up with Dr. Quentin Ore on March 16, 2022.  Pt has been taking amiodarone since beginning of August, 2023.  Explained that amiodarone takes time to build in the system to a therapeutic level.  Advised Dr. Quentin Ore would potentially want to order a cardioversion at her appointment next week.  Explained that her BIV device cannot work at it's maximum potential right now because of the atrial fibrillation with variable VR.    Pt and daughter indicate understanding and thanked nurse for follow up and education.

## 2022-03-09 NOTE — Telephone Encounter (Signed)
The patient daughter Manuela Schwartz (dpr) states the patient had two episodes of dizziness. One on Sunday and one 30 minutes ago. The patient was brushing her teeth when she got dizzy. Her heart rate got up to 101 bpm.  I let her know once the nurse review the transmission, she will give them a call back.

## 2022-03-12 ENCOUNTER — Telehealth: Payer: Self-pay | Admitting: Family Medicine

## 2022-03-12 NOTE — Telephone Encounter (Signed)
Submitted request today for right knee zilretta. Uploaded last office note.

## 2022-03-16 ENCOUNTER — Ambulatory Visit: Payer: Medicare Other | Attending: Cardiology | Admitting: Cardiology

## 2022-03-16 ENCOUNTER — Encounter: Payer: Self-pay | Admitting: Cardiology

## 2022-03-16 VITALS — BP 120/84 | HR 101 | Ht 62.0 in | Wt 179.6 lb

## 2022-03-16 DIAGNOSIS — I495 Sick sinus syndrome: Secondary | ICD-10-CM | POA: Diagnosis not present

## 2022-03-16 DIAGNOSIS — Z950 Presence of cardiac pacemaker: Secondary | ICD-10-CM | POA: Diagnosis not present

## 2022-03-16 DIAGNOSIS — I4892 Unspecified atrial flutter: Secondary | ICD-10-CM

## 2022-03-16 DIAGNOSIS — I480 Paroxysmal atrial fibrillation: Secondary | ICD-10-CM | POA: Diagnosis not present

## 2022-03-16 DIAGNOSIS — Z79899 Other long term (current) drug therapy: Secondary | ICD-10-CM

## 2022-03-16 LAB — PACEMAKER DEVICE OBSERVATION

## 2022-03-16 NOTE — Progress Notes (Signed)
Electrophysiology Office Follow up Visit Note:    Date:  03/16/2022   ID:  Sheila Schmidt, DOB September 24, 1935, MRN [id number]  PCP:  Darreld Mclean, MD  [facility] Cardiologist:  None  [facility] Electrophysiologist:  Vickie Epley, MD    Interval History:    Sheila Schmidt is a 86 y.o. female who presents for a follow up visit.   She had a CRT-P implanted December 04, 2021 given a history of nonischemic cardiomyopathy, left bundle branch block and an EF of 30%.  She has done well since device implant.  Remote interrogations have shown 100% burden of atrial flutter.  She is on [place].  Today, she reports that she has been very tired. She has been compliant with her Xarelto and has not missed any doses.  Patient's daughter states that she had an episode recently wherein she felt very lightheaded at a restaurant and again later that week. They contacted the device clinic after this second episode. They suspect that this may have been secondary to orthostatic hypotension.  After a prior cardioversion, she did go back into atrial flutter while overexerting herself on a stationary bike.  Patient recently saw Dr. Raeford Razor regarding her knee OA and they would like to know if it is okay for her to have a Zilretta injection. She was advised that this should be okay from a cardiac perspective but do not hold the Xarelto.     Past Medical History:  Diagnosis Date   Cancer ([place])     Left breast carcinoma in situ   Cholelithiasis    Depression    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   Hx of adenomatous colonic polyps    Hyperlipidemia     Past Surgical History:  Procedure Laterality Date   ATRIAL FLUTTER ABLATION     BIV PACEMAKER INSERTION CRT-P N/A 12/04/2021   Procedure: BIV PACEMAKER INSERTION CRT-P;  Surgeon: Vickie Epley, MD;  Location: [place] [facility];  Service: Cardiovascular;  Laterality: N/A;   BREAST EXCISIONAL BIOPSY Left 2010   benign    CARDIOVERSION  05/2020   in [place] N/A 06/21/2021   Procedure: CARDIOVERSION;  Surgeon: Geralynn Rile, MD;  Location: [place];  Service: Cardiovascular;  Laterality: N/A;   CHOLECYSTECTOMY     CHOLECYSTECTOMY, LAPAROSCOPIC  '06   [place] ARTHROSCOPY     Left '00 [place]/ Right '08   lumpectomy- remote     benign   TOOTH EXTRACTION     TOTAL KNEE ARTHROPLASTY  02/05/2011   left    Current Medications: Current Meds  Medication Sig   acetaminophen (TYLENOL) 500 MG tablet Take 1,000 mg by mouth every 8 (eight) hours as needed for moderate pain.   acetaminophen-codeine (TYLENOL #3) 300-30 MG tablet Take 1 tablet by mouth every 6 (six) hours as needed for moderate pain.   albuterol (VENTOLIN HFA) 108 (90 Base) MCG/ACT inhaler Inhale 2 puffs into the lungs every 6 (six) hours as needed for wheezing or shortness of breath.   amiodarone (PACERONE) 200 MG tablet Take 1 tablet (200 mg total) by mouth daily.   atorvastatin (LIPITOR) 20 MG tablet Take 1 tablet (20 mg total) by mouth daily.   Calcium Carbonate-Vit D-Min (CALTRATE 600+D PLUS MINERALS) 600-800 MG-UNIT CHEW Chew 1 each by mouth daily.   Cholecalciferol 50 MCG (2000 UT) TABS Take 2,000 Units by mouth daily.   famotidine (PEPCID) 40 MG tablet  Take 40 mg by mouth daily as needed for heartburn or indigestion.   FLUoxetine (PROZAC) 20 MG capsule Take 1 capsule (20 mg total) by mouth daily.   melatonin 5 MG TABS Take 5 mg by mouth at bedtime.   rivaroxaban (XARELTO) 20 MG TABS tablet Take 1 tablet (20 mg total) by mouth daily with supper.   sacubitril-valsartan (ENTRESTO) 24-26 MG Take 1 tablet by mouth 2 (two) times daily.   vitamin B-12 (CYANOCOBALAMIN) 100 MCG tablet Take 100 mcg by mouth daily.     Allergies:   Patient has no known allergies.   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 HS[employer]. Married '59, 3 daughters- '61, '64, '73; 2 grandchildren. Exercise - goes to gym 5/wk. ACP - directed to the [email or web address]   Lives with her daughter - goes between daughters home in [place] and her own home here   Social Determinants of Health   Financial Resource Strain: Low Risk  (02/12/2021)   Overall Financial Resource Strain (CARDIA)    Difficulty of Paying Living Expenses: Not hard at all  Food Insecurity: No Food Insecurity (02/12/2021)   Hunger Vital Sign    Worried About Running Out of Food in the Last Year: Never true    [place] in the Last Year: Never true  Transportation Needs: No Transportation Needs (02/12/2021)   PRAPARE - Hydrologist (Medical): No    Lack of Transportation (Non-Medical): No  Physical Activity: Insufficiently Active (02/12/2021)   Exercise Vital Sign    Days of Exercise per Week: 7 days    Minutes of Exercise per Session: 20 min  Stress: No Stress Concern Present (02/12/2021)   [place]    Feeling of Stress : Not at all  Social Connections: Socially Isolated (02/12/2021)   Social Connection and Isolation Panel [NHANES]    Frequency of Communication with Friends and Family: More than three times a week    Frequency of Social Gatherings with Friends and Family: More than three times a week    Attends Religious Services: Never    Marine scientist or Organizations: No     Attends Archivist Meetings: Never    Marital Status: Widowed     Family History: The patient's family history includes Cancer in her father; Diabetes in her mother; Heart attack in her mother; Heart disease in her mother and sister. There is no history of Breast cancer or Colon cancer.  ROS:   Please see the history of present illness.    All other systems reviewed and are negative.  EKGs/Labs/Other Studies Reviewed:    The following studies were reviewed today:  March 16, 2022 in  clinic device interrogation personally reviewed Battery longevity 4.8-7.1 years Lead parameters stable DDD 60-120 Presenting AFL (in flutter since 01/09/2022) BP 58%   Recent Labs: 05/22/2021: ALT 15 10/12/2021: Magnesium 1.8 11/20/2021: Hemoglobin 13.9; Platelets 190.0; TSH 2.92 11/23/2021: BUN 13; Creatinine, Ser 0.91; Potassium 4.3; Sodium 140  Recent Lipid Panel    Component Value Date/Time   CHOL 234 (H) 03/31/2018 1427   TRIG 89.0 03/31/2018 1427   HDL 58.20 03/31/2018 1427   CHOLHDL 4 03/31/2018 1427   VLDL 17.8 03/31/2018 1427   LDLCALC 158 (H) 03/31/2018 1427    Physical Exam:    VS:  BP 120/84   Pulse (!) 101   Ht '5\' 2"'$  (1.575 m)   Wt 179 lb 9.6 oz (81.5 kg)   SpO2 (!) 82%   BMI 32.85 kg/m     Wt Readings from Last 3 Encounters:  03/16/22 179 lb 9.6 oz (81.5 kg)  03/07/22 180 lb (81.6 kg)  02/15/22 180 lb (81.6 kg)     GEN: Well nourished, well developed in no acute distress HEENT: Normal NECK: No JVD; No carotid bruits LYMPHATICS: No lymphadenopathy CARDIAC: Irregularly irregular rhythm, tachycardic rate, no murmurs, rubs, gallops.  Prepectoral pocket well-healed. RESPIRATORY:  Clear to auscultation without rales, wheezing or rhonchi  ABDOMEN: Soft, non-tender, non-distended MUSCULOSKELETAL:  No edema; No deformity  SKIN: Warm and dry NEUROLOGIC:  Alert and oriented x 3 PSYCHIATRIC:  Normal affect        ASSESSMENT:    1. Paroxysmal atrial  fibrillation (HCC)   2. Paroxysmal atrial flutter ([place])   3. Tachycardia-bradycardia syndrome ([place])   4. Cardiac pacemaker in situ   5. Encounter for long-term (current) use of high-risk medication    PLAN:    In order of problems listed above:  #Paroxysmal atrial fibrillation and flutter High burden on recent device interrogations.  She has now been started on amiodarone.  She has been anticoagulated continuously for at least 6 weeks.  I will plan to schedule a cardioversion to see if we can restore normal rhythm now that the pacemaker is in place and functioning appropriately.  I discussed cardioversion in detail with the patient including the risks and she wishes to proceed.  If she were to have recurrence after DCCV while on amiodarone, would recommend proceeding with AV junction ablation in an effort to maximize CRT pacing.  #Tachybradycardia syndrome #Permanent pacemaker in situ Device functioning appropriately.  Continue remote monitoring.  #Amiodarone monitoring She needs to have updated CMP, TSH and free T4 today.  Follow-up 3 months and 6 months with APP.  Follow-up with me in 1 year or sooner as needed.    Total time spent with patient today 41 minutes. This includes reviewing records, evaluating the patient and coordinating care.   Medication Adjustments/Labs and Tests Ordered: Current medicines are reviewed at length with the patient today.  Concerns regarding medicines are outlined above.  Orders Placed This Encounter  Procedures   CBC   Comprehensive metabolic panel   T4, free   TSH   No orders of the defined types were placed in this encounter.  This document serves as a record of services personally performed by Lars Mage, MD. It was created on her behalf by Eugene Gavia, a trained medical scribe. The creation of this record is based on the scribe's personal observations and the provider's statements to them. This document has been checked and approved  by the attending provider.  I, Vickie Epley, MD, have reviewed  all documentation for this visit. The documentation on 03/16/22 for the exam, diagnosis, procedures, and orders are all accurate and complete.    Signed, Lars Mage, MD, [facility], [facility] 03/16/2022 6:58 PM    Electrophysiology [facility]

## 2022-03-16 NOTE — Patient Instructions (Addendum)
Medication Instructions:  Your physician recommends that you continue on your current medications as directed. Please refer to the Current Medication list given to you today.  *If you need a refill on your cardiac medications before your next appointment, please call your pharmacy*  Lab Work: CMET, CBC, TSH, T4 on Wednesday 10/11 between 7:15am and 5:00pm If you have labs (blood work) drawn today and your tests are completely normal, you will receive your results only by: [place] (if you have MyChart) OR A paper copy in the mail If you have any lab test that is abnormal or we need to change your treatment, we will call you to review the results.  Testing/Procedures: Your physician has recommended that you have a Cardioversion (DCCV). Electrical Cardioversion uses a jolt of electricity to your heart either through paddles or wired patches attached to your chest. This is a controlled, usually prescheduled, procedure. Defibrillation is done under light anesthesia in the hospital, and you usually go home the day of the procedure. This is done to get your heart back into a normal rhythm. You are not awake for the procedure. Please see the instruction sheet given to you today.  Follow-Up: At [facility], you and your health needs are our priority.  As part of our continuing mission to provide you with exceptional heart care, we have created designated Provider Care Teams.  These Care Teams include your primary Cardiologist (physician) and Advanced Practice Providers (APPs -  Physician Assistants and Nurse Practitioners) who all work together to provide you with the care you need, when you need it.  Your next appointment:   3 month(s)  The format for your next appointment:   In Person  Provider:   You will see one of the following Advanced Practice Providers on your designated Care Team:   Sheila Schmidt, Sheila Schmidt, Sheila  Important Information About  [place]

## 2022-03-16 NOTE — Progress Notes (Signed)
Remote pacemaker transmission.   

## 2022-03-16 NOTE — Progress Notes (Deleted)
Electrophysiology Office Follow up Visit Note:    Date:  03/16/2022   ID:  Sheila Schmidt, DOB 03-08-1936, MRN [id number]  PCP:  Darreld Mclean, MD  [facility] Cardiologist:  None  [facility] Electrophysiologist:  Vickie Epley, MD    Interval History:    Sheila Schmidt is a 86 y.o. female who presents for a follow up visit.   She had a CRT-P implanted December 04, 2021 given a history of nonischemic cardiomyopathy, left bundle branch block and an EF of 30%.  She has done well since device implant.  Remote interrogations have shown 100% burden of atrial flutter.  She is on [place].     Past Medical History:  Diagnosis Date   Cancer ([place])     Left breast carcinoma in situ   Cholelithiasis    Depression    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   Hx of adenomatous colonic polyps    Hyperlipidemia     Past Surgical History:  Procedure Laterality Date   ATRIAL FLUTTER ABLATION     BIV PACEMAKER INSERTION CRT-P N/A 12/04/2021   Procedure: BIV PACEMAKER INSERTION CRT-P;  Surgeon: Vickie Epley, MD;  Location: [place] [facility];  Service: Cardiovascular;  Laterality: N/A;   BREAST EXCISIONAL BIOPSY Left 2010   benign   CARDIOVERSION  05/2020   in [place] N/A 06/21/2021   Procedure: CARDIOVERSION;  Surgeon: Geralynn Rile, MD;  Location: [place];  Service: Cardiovascular;  Laterality: N/A;   CHOLECYSTECTOMY     CHOLECYSTECTOMY, LAPAROSCOPIC  '06   [place] ARTHROSCOPY     Left '00 [place]/ Right '08   lumpectomy- remote     benign   TOOTH EXTRACTION     TOTAL KNEE ARTHROPLASTY  02/05/2011   left    Current Medications: No outpatient medications have been marked as taking for the 03/16/22 encounter (Appointment) with Vickie Epley, MD.     Allergies:   Patient has no known allergies.   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 HS[employer]. Married '59, 3 daughters- '61, '64, '73; 2 grandchildren. Exercise - goes to gym 5/wk. ACP - directed to the [email or web address]   Lives with her daughter - goes between daughters home in [place] and her own home here   Social Determinants of Health   Financial Resource Strain: Low Risk  (02/12/2021)   Overall Financial Resource Strain (CARDIA)    Difficulty of Paying Living Expenses: Not hard at all  Food Insecurity: No Food Insecurity (02/12/2021)   Hunger Vital Sign    Worried About Running Out of Food in the Last Year: Never true    [place] in the  Last Year: Never true  Transportation Needs: No Transportation Needs (02/12/2021)   PRAPARE - Hydrologist (Medical): No    Lack of Transportation (Non-Medical): No  Physical Activity: Insufficiently Active (02/12/2021)   Exercise Vital Sign    Days of Exercise per Week: 7 days    Minutes of Exercise per Session: 20 min  Stress: No Stress Concern Present (02/12/2021)   [place]    Feeling of Stress : Not at all  Social Connections: Socially Isolated (02/12/2021)   Social Connection and Isolation Panel [NHANES]    Frequency of Communication with Friends and Family: More than three times a week    Frequency of Social Gatherings with Friends and Family: More than three times a week    Attends Religious Services: Never    Building surveyor or Organizations: No    Attends Archivist Meetings: Never    Marital Status: Widowed     Family History: The patient's family history includes Cancer in her father; Diabetes in her mother; Heart attack in her mother; Heart disease in her mother and sister. There is no history of Breast cancer or Colon cancer.  ROS:   Please see the history of present illness.    All other systems reviewed and are negative.  EKGs/Labs/Other Studies Reviewed:    The following studies were reviewed today:  March 16, 2022 in clinic device interrogation personally reviewed ***  EKG:  The ekg ordered today demonstrates ***  Recent Labs: 05/22/2021: ALT 15 10/12/2021: Magnesium 1.8 11/20/2021: Hemoglobin 13.9; Platelets 190.0; TSH 2.92 11/23/2021: BUN 13; Creatinine, Ser 0.91; Potassium 4.3; Sodium 140  Recent Lipid Panel    Component Value Date/Time   CHOL 234 (H) 03/31/2018 1427   TRIG 89.0 03/31/2018 1427   HDL 58.20 03/31/2018 1427   CHOLHDL 4 03/31/2018 1427   VLDL 17.8 03/31/2018 1427   LDLCALC 158 (H) 03/31/2018 1427    Physical Exam:    VS:  There were no vitals taken for this visit.    Wt Readings from Last 3 Encounters:  03/07/22 180 lb (81.6 kg)  02/15/22 180 lb (81.6 kg)  02/15/22 180 lb (81.6 kg)     GEN: *** Well nourished, well developed in no acute distress HEENT: Normal NECK: No JVD; No carotid bruits LYMPHATICS: No lymphadenopathy CARDIAC: ***RRR, no murmurs, rubs, gallops.  Prepectoral pocket well-healed.*** RESPIRATORY:  Clear to auscultation without rales, wheezing or rhonchi  ABDOMEN: Soft, non-tender, non-distended MUSCULOSKELETAL:  No edema; No deformity  SKIN: Warm and dry NEUROLOGIC:  Alert and oriented x 3 PSYCHIATRIC:  Normal affect        ASSESSMENT:    1. Paroxysmal atrial fibrillation (HCC)   2. Paroxysmal atrial flutter ([place])   3. Tachycardia-bradycardia syndrome ([place])   4. Cardiac pacemaker in situ   5. Encounter  for long-term (current) use of high-risk medication    PLAN:    In order of problems listed above:  #Paroxysmal atrial fibrillation and flutter High burden on recent device interrogations.  She has now been started on amiodarone.  She has been anticoagulated continuously for at least 6 weeks.  I will plan to schedule a cardioversion to see if we can restore normal rhythm now that the pacemaker is in place and functioning appropriately.  I discussed cardioversion in detail with the patient including the risks and she wishes to proceed.  #Tachybradycardia syndrome #Permanent pacemaker in situ Device  functioning appropriately.  Continue remote monitoring.  #Amiodarone monitoring She needs to have updated CMP, TSH and free T4 today.  Follow-up 3 months and 6 months with APP.  Follow-up with me in 1 year or sooner as needed.    Total time spent with patient today *** minutes. This includes reviewing records, evaluating the patient and coordinating care.   Medication Adjustments/Labs and Tests Ordered: Current medicines are reviewed at length with the patient today.  Concerns regarding medicines are outlined above.  No orders of the defined types were placed in this encounter.  No orders of the defined types were placed in this encounter.    Signed, Lars Mage, MD, [facility], [facility] 03/16/2022 5:43 AM    Electrophysiology [facility]

## 2022-03-20 NOTE — Telephone Encounter (Signed)
Left message for patient to call back.   [employer] summary of benefits for Zilretta states no PA is req'd. Patient will be responsible for 20% of Zilretta and CPT 20610 with the remaining covered at 80% by plan.

## 2022-03-21 ENCOUNTER — Ambulatory Visit: Payer: Medicare Other | Attending: Cardiology

## 2022-03-21 ENCOUNTER — Encounter: Payer: Self-pay | Admitting: Family Medicine

## 2022-03-21 DIAGNOSIS — Z79899 Other long term (current) drug therapy: Secondary | ICD-10-CM

## 2022-03-21 DIAGNOSIS — I495 Sick sinus syndrome: Secondary | ICD-10-CM

## 2022-03-21 DIAGNOSIS — I480 Paroxysmal atrial fibrillation: Secondary | ICD-10-CM

## 2022-03-21 DIAGNOSIS — Z950 Presence of cardiac pacemaker: Secondary | ICD-10-CM

## 2022-03-21 DIAGNOSIS — I4892 Unspecified atrial flutter: Secondary | ICD-10-CM

## 2022-03-22 ENCOUNTER — Encounter: Payer: Self-pay | Admitting: Cardiology

## 2022-03-22 LAB — CBC
Hematocrit: 42 % (ref 34.0–46.6)
Hemoglobin: 14.3 g/dL (ref 11.1–15.9)
MCH: 34.1 pg — ABNORMAL HIGH (ref 26.6–33.0)
MCHC: 34 g/dL (ref 31.5–35.7)
MCV: 100 fL — ABNORMAL HIGH (ref 79–97)
Platelets: 211 10*3/uL (ref 150–450)
RBC: 4.19 x10E6/uL (ref 3.77–5.28)
RDW: 12.4 % (ref 11.7–15.4)
WBC: 10 10*3/uL (ref 3.4–10.8)

## 2022-03-22 LAB — COMPREHENSIVE METABOLIC PANEL
ALT: 13 IU/L (ref 0–32)
AST: 17 IU/L (ref 0–40)
Albumin/Globulin Ratio: 1.6 (ref 1.2–2.2)
Albumin: 4 g/dL (ref 3.7–4.7)
Alkaline Phosphatase: 68 IU/L (ref 44–121)
BUN/Creatinine Ratio: 17 (ref 12–28)
BUN: 21 mg/dL (ref 8–27)
Bilirubin Total: 0.8 mg/dL (ref 0.0–1.2)
CO2: 23 mmol/L (ref 20–29)
Calcium: 9.6 mg/dL (ref 8.7–10.3)
Chloride: 103 mmol/L (ref 96–106)
Creatinine, Ser: 1.25 mg/dL — ABNORMAL HIGH (ref 0.57–1.00)
Globulin, Total: 2.5 g/dL (ref 1.5–4.5)
Glucose: 88 mg/dL (ref 70–99)
Potassium: 4.5 mmol/L (ref 3.5–5.2)
Sodium: 138 mmol/L (ref 134–144)
Total Protein: 6.5 g/dL (ref 6.0–8.5)
eGFR: 43 mL/min/{1.73_m2} — ABNORMAL LOW (ref >59)

## 2022-03-22 LAB — T4, FREE: Free T4: 1.51 ng/dL (ref 0.82–1.77)

## 2022-03-22 LAB — TSH: TSH: 2.27 u[IU]/mL (ref 0.450–4.500)

## 2022-03-23 NOTE — Telephone Encounter (Signed)
Pt informed of below.  OV scheduled 03/29/22.

## 2022-03-25 NOTE — Anesthesia Preprocedure Evaluation (Signed)
Anesthesia Evaluation  Patient identified by MRN, date of birth, ID band Patient awake    Reviewed: Allergy & Precautions, NPO status , Patient's Chart, lab work & pertinent test results  History of Anesthesia Complications Negative for: history of anesthetic complications  Airway Mallampati: II  TM Distance: >3 FB Neck ROM: Full    Dental  (+) Dental Advisory Given   Pulmonary neg pulmonary ROS,    breath sounds clear to auscultation       Cardiovascular hypertension, Pt. on medications +CHF  + dysrhythmias Atrial Fibrillation  Rhythm:Irregular Rate:Normal  Echo 11/23/21 IMPRESSIONS    1. Diffuse hypokinesis, worse in the inferior, inferoseptal walls. Poor  acoustic windows make evaluation and comparison to previous echo from 2022  difficult. OVerall, LVEF appears a little worse Consider limited echo with  Definity to confirm LV wall  motion. Left ventricular ejection fraction, by estimation, is 30%. The  left ventricle has moderately decreased function. Left ventricular  diastolic parameters are indeterminate.  2. Right ventricular systolic function is low normal. The right  ventricular size is normal. There is mildly elevated pulmonary artery  systolic pressure.  3. Left atrial size was mildly dilated.  4. The mitral valve is normal in structure. Mild mitral valve  regurgitation.  5. The aortic valve is tricuspid. Aortic valve regurgitation is trivial.  Aortic valve sclerosis is present, with no evidence of aortic valve  stenosis.    Neuro/Psych PSYCHIATRIC DISORDERS Anxiety Depression  Neuromuscular disease    GI/Hepatic negative GI ROS, Neg liver ROS,   Endo/Other  negative endocrine ROS  Renal/GU negative Renal ROS     Musculoskeletal  (+) Arthritis ,   Abdominal   Peds  Hematology negative hematology ROS (+)   Anesthesia Other Findings   Reproductive/Obstetrics                             Anesthesia Physical  Anesthesia Plan  ASA: 4  Anesthesia Plan: General   Post-op Pain Management:    Induction: Intravenous  PONV Risk Score and Plan: Treatment may vary due to age or medical condition  Airway Management Planned: Natural Airway and Mask  Additional Equipment:   Intra-op Plan:   Post-operative Plan:   Informed Consent: I have reviewed the patients History and Physical, chart, labs and discussed the procedure including the risks, benefits and alternatives for the proposed anesthesia with the patient or authorized representative who has indicated his/her understanding and acceptance.     Dental advisory given  Plan Discussed with: Anesthesiologist  Anesthesia Plan Comments: (20/100)      Anesthesia Quick Evaluation

## 2022-03-26 ENCOUNTER — Ambulatory Visit (HOSPITAL_COMMUNITY)
Admission: RE | Admit: 2022-03-26 | Discharge: 2022-03-26 | Disposition: A | Discharge status: Home / Self Care | Payer: Medicare Other | Attending: Cardiology | Admitting: Cardiology

## 2022-03-26 ENCOUNTER — Ambulatory Visit (HOSPITAL_COMMUNITY): Payer: Medicare Other | Admitting: Anesthesiology

## 2022-03-26 ENCOUNTER — Ambulatory Visit (HOSPITAL_BASED_OUTPATIENT_CLINIC_OR_DEPARTMENT_OTHER): Payer: Medicare Other | Admitting: Anesthesiology

## 2022-03-26 ENCOUNTER — Other Ambulatory Visit: Payer: Self-pay

## 2022-03-26 ENCOUNTER — Encounter (HOSPITAL_COMMUNITY)
Admission: RE | Disposition: A | Discharge status: Home / Self Care | Payer: Self-pay | Source: Home / Self Care | Attending: Cardiology

## 2022-03-26 ENCOUNTER — Encounter (HOSPITAL_COMMUNITY): Payer: Self-pay | Admitting: Cardiology

## 2022-03-26 DIAGNOSIS — I495 Sick sinus syndrome: Secondary | ICD-10-CM | POA: Diagnosis not present

## 2022-03-26 DIAGNOSIS — I509 Heart failure, unspecified: Secondary | ICD-10-CM | POA: Diagnosis not present

## 2022-03-26 DIAGNOSIS — I4891 Unspecified atrial fibrillation: Secondary | ICD-10-CM

## 2022-03-26 DIAGNOSIS — I4892 Unspecified atrial flutter: Secondary | ICD-10-CM | POA: Diagnosis present

## 2022-03-26 DIAGNOSIS — I447 Left bundle-branch block, unspecified: Secondary | ICD-10-CM | POA: Diagnosis not present

## 2022-03-26 DIAGNOSIS — I110 Hypertensive heart disease with heart failure: Secondary | ICD-10-CM

## 2022-03-26 DIAGNOSIS — Z950 Presence of cardiac pacemaker: Secondary | ICD-10-CM | POA: Insufficient documentation

## 2022-03-26 DIAGNOSIS — I480 Paroxysmal atrial fibrillation: Secondary | ICD-10-CM | POA: Diagnosis not present

## 2022-03-26 DIAGNOSIS — Z7901 Long term (current) use of anticoagulants: Secondary | ICD-10-CM | POA: Diagnosis not present

## 2022-03-26 DIAGNOSIS — I483 Typical atrial flutter: Secondary | ICD-10-CM

## 2022-03-26 DIAGNOSIS — I498 Other specified cardiac arrhythmias: Secondary | ICD-10-CM | POA: Diagnosis not present

## 2022-03-26 DIAGNOSIS — I428 Other cardiomyopathies: Secondary | ICD-10-CM | POA: Insufficient documentation

## 2022-03-26 DIAGNOSIS — Z01818 Encounter for other preprocedural examination: Secondary | ICD-10-CM

## 2022-03-26 HISTORY — PX: CARDIOVERSION: SHX1299

## 2022-03-26 SURGERY — CARDIOVERSION
Anesthesia: General

## 2022-03-26 MED ORDER — LIDOCAINE 2% (20 MG/ML) 5 ML SYRINGE
INTRAMUSCULAR | Status: DC | PRN
  Administered 2022-03-26: 100 mg via INTRAVENOUS

## 2022-03-26 MED ORDER — PROPOFOL 10 MG/ML IV BOLUS
INTRAVENOUS | Status: DC | PRN
  Administered 2022-03-26: 60 mg via INTRAVENOUS

## 2022-03-26 MED ORDER — SODIUM CHLORIDE 0.9 % IV SOLN: INTRAVENOUS | Status: DC

## 2022-03-26 NOTE — H&P (Signed)
Office Visit  03/16/2022 [facility] [place] [facility] [place]. [facility]  Lars Mage T, MD Cardiology Paroxysmal atrial fibrillation [facility]) +4 more Dx Referred by Copland, Gay Filler, MD Reason for Visit   Additional Documentation  Vitals:  BP 120/84 Pulse 101 Important   Ht '5\' 2"'$  (1.575 m) Wt 81.5 kg SpO2 82% Important   BMI 32.85 kg/m BSA 1.89 m  Flowsheets:  NEWS, MEWS Score, Anthropometrics   Encounter Info:  Billing Info, History, Allergies, Detailed Report    All Notes    Progress Notes by Vickie Epley, MD at 03/16/2022 2:15 PM  Author: Vickie Epley, MD Author Type: Physician Filed: 03/16/2022  7:04 PM  Note Status: Signed Cosign: Cosign Not Required Encounter Date: 03/16/2022  Editor: Vickie Epley, MD (Physician)      Prior Versions: 1. Eugene Gavia at 03/16/2022  2:15 PM - Incomplete  Expand AllCollapse All    Electrophysiology Office Follow up Visit Note:     Date:  03/16/2022    ID:  Sheila Schmidt, DOB 13-Apr-1936, MRN [id number]   PCP:  Darreld Mclean, MD            [facility] Cardiologist:  None  [facility] Electrophysiologist:  Vickie Epley, MD      Interval History:     Sheila Schmidt is a 86 y.o. female who presents for a follow up visit.    She had a CRT-P implanted December 04, 2021 given a history of nonischemic cardiomyopathy, left bundle branch block and an EF of 30%.   She has done well since device implant.  Remote interrogations have shown 100% burden of atrial flutter.  She is on [place].   Today, she reports that she has been very tired. She has been compliant with her Xarelto and has not missed any doses.   Patient's daughter states that she had an episode recently wherein she felt very lightheaded at a restaurant and again later that week. They contacted the device clinic after this second episode. They suspect that this may have been secondary to orthostatic hypotension.    After a prior cardioversion, she did go back into atrial flutter while overexerting herself on a stationary bike.   Patient recently saw Dr. Raeford Razor regarding her knee OA and they would like to know if it is okay for her to have a Zilretta injection. She was advised that this should be okay from a cardiac perspective but do not hold the Xarelto.     Objective      Past Medical History:  Diagnosis Date   Cancer ([place])       Left breast carcinoma in situ   Cholelithiasis     Depression     DJD (degenerative joint disease) of knee      bilateral   Hx of adenomatous colonic polyps     Hyperlipidemia             Past Surgical History:  Procedure Laterality Date   ATRIAL FLUTTER ABLATION       BIV PACEMAKER INSERTION CRT-P N/A 12/04/2021    Procedure: BIV PACEMAKER INSERTION CRT-P;  Surgeon: Vickie Epley, MD;  Location: [facility];  Service: Cardiovascular;  Laterality: N/A;   BREAST EXCISIONAL BIOPSY Left 2010    benign   CARDIOVERSION   05/2020    in [place] N/A 06/21/2021    Procedure: CARDIOVERSION;  Surgeon: Geralynn Rile, MD;  Location: [facility];  Service: Cardiovascular;  Laterality: N/A;   CHOLECYSTECTOMY       CHOLECYSTECTOMY, LAPAROSCOPIC   '06    [place] ARTHROSCOPY        Left '00 [place]/ Right '08   lumpectomy- remote        benign   TOOTH EXTRACTION       TOTAL KNEE ARTHROPLASTY   02/05/2011    left      Current Medications:     Current Meds  Medication Sig   acetaminophen (TYLENOL) 500 MG tablet Take 1,000 mg by mouth every 8 (eight) hours as needed for moderate pain.   acetaminophen-codeine (TYLENOL #3) 300-30 MG tablet Take 1 tablet by mouth every 6 (six) hours as needed for moderate pain.   albuterol (VENTOLIN HFA) 108 (90 Base) MCG/ACT inhaler Inhale 2 puffs into the lungs every 6 (six) hours as needed for wheezing or shortness of breath.   amiodarone (PACERONE) 200 MG tablet Take 1 tablet (200 mg total) by mouth  daily.   atorvastatin (LIPITOR) 20 MG tablet Take 1 tablet (20 mg total) by mouth daily.   Calcium Carbonate-Vit D-Min (CALTRATE 600+D PLUS MINERALS) 600-800 MG-UNIT CHEW Chew 1 each by mouth daily.   Cholecalciferol 50 MCG (2000 UT) TABS Take 2,000 Units by mouth daily.   famotidine (PEPCID) 40 MG tablet Take 40 mg by mouth daily as needed for heartburn or indigestion.   FLUoxetine (PROZAC) 20 MG capsule Take 1 capsule (20 mg total) by mouth daily.   melatonin 5 MG TABS Take 5 mg by mouth at bedtime.   rivaroxaban (XARELTO) 20 MG TABS tablet Take 1 tablet (20 mg total) by mouth daily with supper.   sacubitril-valsartan (ENTRESTO) 24-26 MG Take 1 tablet by mouth 2 (two) times daily.   vitamin B-12 (CYANOCOBALAMIN) 100 MCG tablet Take 100 mcg by mouth daily.      Allergies:   Patient has no known allergies.    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. Married '59, 3 daughters- '61, '64, '73; 2 grandchildren. Exercise - goes to gym 5/wk. ACP - directed to the [email or web address]    Lives with her daughter - goes between daughters home in [place] and her own home here    Social Determinants of Health        Financial Resource Strain: Low Risk  (02/12/2021)    Overall Financial Resource Strain (CARDIA)     Difficulty of Paying Living Expenses: Not hard at all  Food Insecurity: No  Food Insecurity (02/12/2021)    Hunger Vital Sign     Worried About [place] in the Last Year:  Never true     Ran Out of Food in the Last Year: Never true  Transportation Needs: No Transportation Needs (02/12/2021)    PRAPARE - Armed forces logistics/support/administrative officer (Medical): No     Lack of Transportation (Non-Medical): No  Physical Activity: Insufficiently Active (02/12/2021)    Exercise Vital Sign     Days of Exercise per Week: 7 days     Minutes of Exercise per Session: 20 min  Stress: No Stress Concern Present (02/12/2021)    [place]     Feeling of Stress : Not at all  Social Connections: Socially Isolated (02/12/2021)    Social Connection and Isolation Panel [NHANES]     Frequency of Communication with Friends and Family: More than three times a week     Frequency of Social Gatherings with Friends and Family: More than three times a week     Attends Religious Services: Never     Marine scientist or Organizations: No     Attends Archivist Meetings: Never     Marital Status: Widowed      Family History: The patient's family history includes Cancer in her father; Diabetes in her mother; Heart attack in her mother; Heart disease in her mother and sister. There is no history of Breast cancer or Colon cancer.   ROS:   Please see the history of present illness.    All other systems reviewed and are negative.   EKGs/Labs/Other Studies Reviewed:     The following studies were reviewed today:   March 16, 2022 in clinic device interrogation personally reviewed Battery longevity 4.8-7.1 years Lead parameters stable DDD 60-120 Presenting AFL (in flutter since 01/09/2022) BP 58%     Recent Labs: 05/22/2021: ALT 15 10/12/2021: Magnesium 1.8 11/20/2021: Hemoglobin 13.9; Platelets 190.0; TSH 2.92 11/23/2021: BUN 13; Creatinine, Ser 0.91; Potassium 4.3; Sodium 140  Recent Lipid Panel          Component Value Date/Time    CHOL 234 (H) 03/31/2018 1427    TRIG 89.0 03/31/2018 1427    HDL 58.20 03/31/2018 1427    CHOLHDL 4 03/31/2018 1427    VLDL 17.8 03/31/2018 1427    LDLCALC 158 (H) 03/31/2018 1427      Physical Exam:     VS:  BP 120/84   Pulse (!) 101   Ht '5\' 2"'$  (1.575 m)   Wt 179 lb 9.6 oz (81.5 kg)   SpO2 (!) 82%   BMI 32.85 kg/m         Wt Readings from Last 3 Encounters:  03/16/22 179 lb 9.6 oz (81.5 kg)  03/07/22 180 lb (81.6 kg)  02/15/22 180 lb (81.6 kg)      GEN: Well nourished, well developed in no acute distress HEENT: Normal NECK: No JVD; No carotid bruits LYMPHATICS: No lymphadenopathy CARDIAC: Irregularly irregular rhythm, tachycardic rate, no murmurs, rubs, gallops.  Prepectoral pocket well-healed. RESPIRATORY:  Clear to auscultation without rales, wheezing or rhonchi  ABDOMEN: Soft, non-tender, non-distended MUSCULOSKELETAL:  No edema; No deformity  SKIN: Warm and dry NEUROLOGIC:  Alert and oriented x 3 PSYCHIATRIC:  Normal affect            Assessment ASSESSMENT:     1. Paroxysmal atrial fibrillation (HCC)   2. Paroxysmal atrial flutter ([place])   3. Tachycardia-bradycardia syndrome ([place])   4. Cardiac pacemaker in situ   5. Encounter for long-term (current) use of  high-risk medication     PLAN:     In order of problems listed above:   #Paroxysmal atrial fibrillation and flutter High burden on recent device interrogations.  She has now been started on amiodarone.  She has been anticoagulated continuously for at least 6 weeks.  I will plan to schedule a cardioversion to see if we can restore normal rhythm now that the pacemaker is in place and functioning appropriately.  I discussed cardioversion in detail with the patient including the risks and she wishes to proceed.   If she were to have recurrence after DCCV while on amiodarone, would recommend proceeding with AV junction ablation in an effort to maximize CRT pacing.    #Tachybradycardia syndrome #Permanent pacemaker in situ Device functioning appropriately.  Continue remote monitoring.   #Amiodarone monitoring She needs to have updated CMP, TSH and free T4 today.   Follow-up 3 months and 6 months with APP.  Follow-up with me in 1 year or sooner as needed.       Total time spent with patient today 41 minutes. This includes reviewing records, evaluating the patient and coordinating care.    Medication Adjustments/Labs and Tests Ordered: Current medicines are reviewed at length with the patient today.  Concerns regarding medicines are outlined above.     Orders Placed This Encounter  Procedures   CBC   Comprehensive metabolic panel   T4, free   TSH    No orders of the defined types were placed in this encounter.   This document serves as a record of services personally performed by Lars Mage, MD. It was created on her behalf by Eugene Gavia, a trained medical scribe. The creation of this record is based on the scribe's personal observations and the provider's statements to them. This document has been checked and approved by the attending provider.   I, Vickie Epley, MD, have reviewed all documentation for this visit. The documentation on 03/16/22 for the exam, diagnosis, procedures, and orders are all accurate and complete.       Signed, Lars Mage, MD, [facility], [facility] 03/16/2022 6:58 PM    Electrophysiology [facility]      For DCCV; compliant with xarelto; no changes Kirk Ruths

## 2022-03-26 NOTE — Anesthesia Postprocedure Evaluation (Signed)
Anesthesia Post Note  Patient: Sheila Schmidt  Procedure(s) Performed: CARDIOVERSION     Patient location during evaluation: Endoscopy Anesthesia Type: General Level of consciousness: sedated Pain management: pain level controlled Vital Signs Assessment: post-procedure vital signs reviewed and stable Respiratory status: spontaneous breathing and respiratory function stable Cardiovascular status: stable Postop Assessment: no apparent nausea or vomiting Anesthetic complications: no   No notable events documented.  Last Vitals:  Vitals:   03/26/22 0920 03/26/22 0930  BP: 90/60 126/67  Pulse: 60 60  Resp: 17 17  Temp:    SpO2: 92% 94%    Last Pain:  Vitals:   03/26/22 0930  TempSrc:   PainSc: 0-No pain                 Aelyn Stanaland DANIEL

## 2022-03-26 NOTE — Anesthesia Procedure Notes (Signed)
Procedure Name: MAC Date/Time: 03/26/2022 9:02 AM  Performed by: Thelma Comp, CRNAPre-anesthesia Checklist: Patient identified, Emergency Drugs available, Suction available and Patient being monitored Patient Re-evaluated:Patient Re-evaluated prior to induction Oxygen Delivery Method: Ambu bag Preoxygenation: Pre-oxygenation with 100% oxygen Induction Type: IV induction

## 2022-03-26 NOTE — Interval H&P Note (Signed)
History and Physical Interval Note:  03/26/2022 7:43 AM  Sheila Schmidt  has presented today for surgery, with the diagnosis of AFIB.  The various methods of treatment have been discussed with the patient and family. After consideration of risks, benefits and other options for treatment, the patient has consented to  Procedure(s): CARDIOVERSION (N/A) as a surgical intervention.  The patient's history has been reviewed, patient examined, no change in status, stable for surgery.  I have reviewed the patient's chart and labs.  Questions were answered to the patient's satisfaction.     Kirk Ruths

## 2022-03-26 NOTE — Discharge Instructions (Signed)

## 2022-03-26 NOTE — Transfer of Care (Signed)
Immediate Anesthesia Transfer of Care Note  Patient: Sheila Schmidt  Procedure(s) Performed: CARDIOVERSION  Patient Location: Short Stay  Anesthesia Type:General  Level of Consciousness: drowsy  Airway & Oxygen Therapy: Patient Spontanous Breathing  Post-op Assessment: Report given to RN and Post -op Vital signs reviewed and stable  Post vital signs: Reviewed and stable  Last Vitals:  Vitals Value Taken Time  BP 109/65   Temp    Pulse 60 03/26/22 0908  Resp 20 03/26/22 0908  SpO2 98 % 03/26/22 0908  Vitals shown include unvalidated device data.  Last Pain:  Vitals:   03/26/22 0812  TempSrc: Temporal  PainSc: 0-No pain         Complications: No notable events documented.

## 2022-03-26 NOTE — Procedures (Signed)
Electrical Cardioversion Procedure Note Sheila Schmidt [id number] 08/23/1935  Procedure: Electrical Cardioversion Indications:  Atrial Flutter  Procedure Details Consent: Risks of procedure as well as the alternatives and risks of each were explained to the (patient/caregiver).  Consent for procedure obtained. Time Out: Verified patient identification, verified procedure, site/side was marked, verified correct patient position, special equipment/implants available, medications/allergies/relevent history reviewed, required imaging and test results available.  Performed  Patient placed on cardiac monitor, pulse oximetry, supplemental oxygen as necessary.  Sedation given:  Pt sedated by anesthesia with lidocaine 100 mg and diprovan 60 mg IV. Pacer pads placed anterior and posterior chest.  Cardioverted 1 time(s).  Cardioverted at 120J.  Evaluation Findings: Post procedure EKG shows: Atrial paced rhythm Complications: None Patient did tolerate procedure well.   Kirk Ruths 03/26/2022, 7:40 AM

## 2022-03-28 ENCOUNTER — Encounter (HOSPITAL_COMMUNITY): Payer: Self-pay | Admitting: Cardiology

## 2022-03-29 ENCOUNTER — Encounter: Payer: Self-pay | Admitting: Family Medicine

## 2022-03-29 ENCOUNTER — Ambulatory Visit: Payer: Self-pay

## 2022-03-29 ENCOUNTER — Ambulatory Visit (INDEPENDENT_AMBULATORY_CARE_PROVIDER_SITE_OTHER): Payer: Medicare Other | Admitting: Family Medicine

## 2022-03-29 ENCOUNTER — Encounter: Payer: Self-pay | Admitting: Cardiology

## 2022-03-29 VITALS — BP 124/82 | Ht 62.0 in | Wt 165.0 lb

## 2022-03-29 DIAGNOSIS — M1711 Unilateral primary osteoarthritis, right knee: Secondary | ICD-10-CM

## 2022-03-29 MED ORDER — TRIAMCINOLONE ACETONIDE 32 MG IX SRER
32.0000 mg | Freq: Once | INTRA_ARTICULAR | Status: AC
  Administered 2022-03-29: 32 mg via INTRA_ARTICULAR

## 2022-03-29 NOTE — Assessment & Plan Note (Addendum)
Completed zilretta injection  - referral to physical therapy

## 2022-03-29 NOTE — Patient Instructions (Signed)
Good to see you Please use ice   Please send me a message in MyChart with any questions or updates.  Please see me back in 4-6 weeks.   --Dr. Raeford Razor

## 2022-03-29 NOTE — Addendum Note (Signed)
Addended by: Cresenciano Lick on: 03/29/2022 02:50 PM   Modules accepted: Orders

## 2022-03-29 NOTE — Progress Notes (Signed)
  Sheila Schmidt - 86 y.o. female MRN [id number]  Date of birth: 1935-09-17  SUBJECTIVE:  Including CC & ROS.  No chief complaint on file.   Sheila Schmidt is a 86 y.o. female that is  here for zilretta injection.    Review of Systems See HPI   HISTORY: Past Medical, Surgical, Social, and Family History Reviewed & Updated per EMR.   Pertinent Historical Findings include:  Past Medical History:  Diagnosis Date   Cancer ([place])     Left breast carcinoma in situ   Cholelithiasis    Depression    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   Hx of adenomatous colonic polyps    Hyperlipidemia     Past Surgical History:  Procedure Laterality Date   ATRIAL FLUTTER ABLATION     BIV PACEMAKER INSERTION CRT-P N/A 12/04/2021   Procedure: BIV PACEMAKER INSERTION CRT-P;  Surgeon: Vickie Epley, MD;  Location: [facility];  Service: Cardiovascular;  Laterality: N/A;   BREAST EXCISIONAL BIOPSY Left 2010   benign   CARDIOVERSION  05/2020   in [place] N/A 06/21/2021   Procedure: CARDIOVERSION;  Surgeon: Geralynn Rile, MD;  Location: [facility];  Service: Cardiovascular;  Laterality: N/A;   CARDIOVERSION N/A 03/26/2022   Procedure: CARDIOVERSION;  Surgeon: Lelon Perla, MD;  Location: [facility];  Service: Cardiovascular;  Laterality: N/A;   CHOLECYSTECTOMY     CHOLECYSTECTOMY, LAPAROSCOPIC  '06   [place] ARTHROSCOPY     Left '00 [place]/ Right '08   lumpectomy- remote     benign   TOOTH EXTRACTION     TOTAL KNEE ARTHROPLASTY  02/05/2011   left     PHYSICAL EXAM:  VS: BP 124/82 (BP Location: Left Arm, Patient Position: Sitting)   Ht '5\' 2"'$  (1.575 m)   Wt 165 lb (74.8 kg)   BMI 30.18 kg/m  Physical Exam Gen: NAD, alert, cooperative with exam, well-appearing MSK:  Neurovascularly intact     Aspiration/Injection Procedure Note Sheila Schmidt 03-05-1936  Procedure: Injection Indications: right knee pain  Procedure  Details Consent: Risks of procedure as well as the alternatives and risks of each were explained to the (patient/caregiver).  Consent for procedure obtained. Time Out: Verified patient identification, verified procedure, site/side was marked, verified correct patient position, special equipment/implants available, medications/allergies/relevent history reviewed, required imaging and test results available.  Performed.  The area was cleaned with iodine and alcohol swabs.    The right knee superior lateral suprapatellar pouch was injected using 3 cc of 1% lidocaine on a 25-gauge 1-1/2 inch needle.  An 18-gauge 1-1/2 needle was used to achieve aspiration.  The syringe was switched and a mixture containing 5 cc's of 32 mg Zilretta and 4 cc's of 0.25% bupivacaine was injected.  Ultrasound was used. Images were obtained in long views showing the injection.    A sterile dressing was applied.  Patient did tolerate procedure well.       ASSESSMENT & PLAN:   OA (osteoarthritis) of knee Completed zilretta injection  - referral to physical therapy

## 2022-04-04 ENCOUNTER — Telehealth: Payer: Self-pay | Admitting: Cardiology

## 2022-04-04 ENCOUNTER — Other Ambulatory Visit: Payer: Self-pay | Admitting: *Deleted

## 2022-04-04 DIAGNOSIS — N289 Disorder of kidney and ureter, unspecified: Secondary | ICD-10-CM

## 2022-04-04 NOTE — Telephone Encounter (Signed)
Patient states she was returning call. Please advise  

## 2022-04-04 NOTE — Telephone Encounter (Signed)
See results encounter

## 2022-04-05 ENCOUNTER — Ambulatory Visit: Payer: Medicare Other | Admitting: Physical Therapy

## 2022-04-06 ENCOUNTER — Encounter: Payer: Self-pay | Admitting: Physical Therapy

## 2022-04-06 ENCOUNTER — Ambulatory Visit: Payer: Medicare Other | Attending: Family Medicine | Admitting: Physical Therapy

## 2022-04-06 DIAGNOSIS — G8929 Other chronic pain: Secondary | ICD-10-CM | POA: Insufficient documentation

## 2022-04-06 DIAGNOSIS — M1711 Unilateral primary osteoarthritis, right knee: Secondary | ICD-10-CM | POA: Insufficient documentation

## 2022-04-06 DIAGNOSIS — R262 Difficulty in walking, not elsewhere classified: Secondary | ICD-10-CM | POA: Insufficient documentation

## 2022-04-06 DIAGNOSIS — M25561 Pain in right knee: Secondary | ICD-10-CM | POA: Insufficient documentation

## 2022-04-06 DIAGNOSIS — M6281 Muscle weakness (generalized): Secondary | ICD-10-CM | POA: Diagnosis present

## 2022-04-06 DIAGNOSIS — R2681 Unsteadiness on feet: Secondary | ICD-10-CM | POA: Insufficient documentation

## 2022-04-06 NOTE — Therapy (Signed)
43/ OUTPATIENT PHYSICAL THERAPY LOWER EXTREMITY EVALUATION   Patient Name: Sheila Schmidt MRN: [id number] DOB:Nov 07, 1935, 86 y.o., female Today's Date: 04/06/2022   PT End of Session - 04/06/22 0849     Visit Number 1    Number of Visits 12    Date for PT Re-Evaluation 05/18/22    Authorization Type UHC Medicare    Progress Note Due on Visit 10    PT Start Time 0850    PT Stop Time 0930    PT Time Calculation (min) 40 min             Past Medical History:  Diagnosis Date   Cancer ([place])     Left breast carcinoma in situ   Cholelithiasis    Depression    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   Hx of adenomatous colonic polyps    Hyperlipidemia    Past Surgical History:  Procedure Laterality Date   ATRIAL FLUTTER ABLATION     BIV PACEMAKER INSERTION CRT-P N/A 12/04/2021   Procedure: BIV PACEMAKER INSERTION CRT-P;  Surgeon: Vickie Epley, MD;  Location: [facility];  Service: Cardiovascular;  Laterality: N/A;   BREAST EXCISIONAL BIOPSY Left 2010   benign   CARDIOVERSION  05/2020   in [place] N/A 06/21/2021   Procedure: CARDIOVERSION;  Surgeon: Geralynn Rile, MD;  Location: [facility];  Service: Cardiovascular;  Laterality: N/A;   CARDIOVERSION N/A 03/26/2022   Procedure: CARDIOVERSION;  Surgeon: Lelon Perla, MD;  Location: [facility] ENDOS[facility];  Service: Cardiovascular;  Laterality: N/A;   CHOLECYSTECTOMY     CHOLECYSTECTOMY, LAPAROSCOPIC  '06   [place] ARTHROSCOPY     Left '00 [place]/ Right '08   lumpectomy- remote     benign   TOOTH EXTRACTION     TOTAL KNEE ARTHROPLASTY  02/05/2011   left   Patient Active Problem List   Diagnosis Date Noted   Skin cancer 03/07/2022   Closed nondisplaced fracture of distal phalanx of right thumb 02/15/2022   Atrial fibrillation ([place]) 12/04/2021   Cancer ([place]) 05/25/2021   Left bundle branch block 04/12/2021   Dilated cardiomyopathy ([place]) 04/12/2021   Vasculitis ([place])  02/22/2021   Anxiety state 02/12/2021   Body fluid retention 02/12/2021   Cervical intraepithelial neoplasia 02/12/2021   Insomnia 02/12/2021   Pruritus of vulva 02/12/2021   Leg wound, right 10/03/2020   Bilateral primary osteoarthritis of knee 03/30/2019   Paroxysmal atrial fibrillation ([place]) 04/07/2015   Chronic anticoagulation 04/07/2015   Paroxysmal atrial flutter ([place]) 03/16/2015   Essential hypertension, benign 02/24/2014   Sciatica 12/08/2013   Hyperlipidemia 09/25/2013   Osteopenia 11/19/2012   Low back pain 01/26/2010   Carcinoma in situ of breast 09/04/2009   DEPRESSION, PROLONGED 09/04/2009   OA (osteoarthritis) of knee 09/04/2009   PERIPHERAL EDEMA 09/04/2009   COLONIC POLYPS, ADENOMATOUS, HX OF 09/04/2009   Malignant tumor of breast ([place]) 06/11/2009    PCP: Darreld Mclean, MD  REFERRING PROVIDER: Rosemarie Ax   REFERRING DIAG: M17.11 (ICD-10-CM) - Primary osteoarthritis of right knee  THERAPY DIAG:  Chronic pain of right knee  Difficulty in walking, not elsewhere classified  Muscle weakness (generalized)  Unsteadiness on feet  Rationale for Evaluation and Treatment: Rehabilitation  ONSET DATE: acute on chronic, worsened after fall 02/13/2022  SUBJECTIVE:   SUBJECTIVE STATEMENT: Sheila Schmidt is supposed to get a knee replacement but can't because of her heart problem.  She just had a Zilretta shot in her knee (on 03/29/22).  She feels like she is off balance because she is favoring her knee all the time.  She used to go to gym 5 days a week but its been a couple of years.   PERTINENT HISTORY: Afib, history of cardioversion, L breast cancer, L TKA 2012 PAIN:  Are you having pain? Yes: NPRS scale: 1/10 Pain location: R knee Pain description: ache Aggravating factors: walking Relieving factors: sitting, gel injections  PRECAUTIONS: ICD/Pacemaker, history of breast cancer  WEIGHT BEARING RESTRICTIONS: No  FALLS:  Has patient fallen in  last 6 months? Yes. Number of falls 1 fell at casino, tripped and broke thumb  LIVING ENVIRONMENT: Lives with: lives with their daughter Lives in: House/apartment Stairs:  stairs to basement apartment where daughter lives, rarely goes down, 4-5 steps to garage has railing.  Has following equipment at home: Single point cane  OCCUPATION: retired   PLOF: Independent  PATIENT GOALS: like to get back into walking, I used to walk neighborhood and classes at the gym.     OBJECTIVE:   DIAGNOSTIC FINDINGS: R knee 05/30/2022  PATIENT SURVEYS:  LEFS 43/80 = 54%  COGNITION: Overall cognitive status: Within functional limits for tasks assessed     SENSATION: WFL  EDEMA:  none  MUSCLE LENGTH: NT  POSTURE: rounded shoulders and forward head  PALPATION: No tenderness with palpation R knee  LOWER EXTREMITY ROM: full knee extension bil. >90 deg flexion in sitting bil.    LOWER EXTREMITY MMT:  MMT Right eval Left eval  Hip flexion 4+ 4+  Hip extension 4+ 4+  Hip abduction 5 5  Hip adduction 5 5  Knee flexion 5 5  Knee extension 5 5  Ankle dorsiflexion 5 5  Ankle plantarflexion 5 5   (Blank rows = not tested)  LOWER EXTREMITY SPECIAL TESTS:  NT  FUNCTIONAL TESTS:  5 times sit to stand: 18.5 seconds without UE assist, increased R knee pain.  6 minute walk test: 650' - 1 LOB, .1 seated rest break, HR 60, SpO2 96% returned to 98% quickly, SOB  MCTSIB: Condition 1: Avg of 3 trials: 30 sec, Condition 2: Avg of 3 trials: 30 sec, Condition 3: Avg of 3 trials: 30 sec, Condition 4: Avg of 3 trials: 30 sec, and Total Score: 30/120 increased sway on airex.   SLS - R foot forward 5 seconds, L foot forward 30 seconds.  GAIT: Distance walked: 650' Assistive device utilized: None Level of assistance: Complete Independence Comments: antalgitc gait after 350', lands on midfoot.  Gait speed 0.75 m/s  TODAY'S TREATMENT:                                                                                                                               DATE:   04/06/2022: see patient education.      PATIENT EDUCATION:  Education details: initial HEP, findings, and plan of  care.  Person educated: Patient Education method: Explanation, Demonstration, and Handouts Education comprehension: verbalized understanding  HOME EXERCISE PROGRAM: Access Code: [email or web address] URL: [email or web address] Date: 04/06/2022 Prepared by: [place] with Counter Support  - 1 x daily - 7 x weekly - 2 sets - 10 reps - Standing Hip Abduction with Counter Support  - 1 x daily - 7 x weekly - 2 sets - 10 reps - Standing Hip Extension with Counter Support  - 1 x daily - 7 x weekly - 2 sets - 10 reps - Mini Squat with Counter Support  - 1 x daily - 7 x weekly - 2 sets - 10 reps - Standing Single Leg Stance with Counter Support  - 1 x daily - 7 x weekly - 2 sets - 10 reps - Standing Tandem Balance with Counter Support  - 1 x daily - 7 x weekly - 2 sets - 10 reps  ASSESSMENT:  CLINICAL IMPRESSION: Patient is a 86 y.o. female who was seen today for physical therapy evaluation and treatment for right knee osteoarthritis.  She is currently not a candidate for TKA due to afib.  She presents with decreased activity tolerance, only able to walk for 650' in 6 minutes, with increased R knee pain and dyspnea on exertion with exercise.  She also demonstrates impairments in balance as demonstrated by tandem stance x 5 seconds on Right.  She would benefit from skilled physical therapy to improve functional strength, gait, decrease pain, and improve balance to prevent falls.    OBJECTIVE IMPAIRMENTS: Abnormal gait, decreased activity tolerance, decreased balance, decreased endurance, decreased mobility, difficulty walking, decreased strength, and pain.   ACTIVITY LIMITATIONS: standing, squatting, stairs, and locomotion level  PARTICIPATION LIMITATIONS: meal prep,  cleaning, laundry, shopping, and community activity  PERSONAL FACTORS: Age, Fitness, Time since onset of injury/illness/exacerbation, and 3+ comorbidities: afib, history of cancer, osteoarthritis, osteopenia  are also affecting patient's functional outcome.   REHAB POTENTIAL: Good  CLINICAL DECISION MAKING: Evolving/moderate complexity  EVALUATION COMPLEXITY: Moderate   GOALS: Goals reviewed with patient? Yes  SHORT TERM GOALS: Target date: 04/20/2022   Patient will be independent with initial HEP. Baseline: given Goal status: INITIAL  2.  Patient will participate in further balance testing (DGI or FGA, Berg) Baseline:  Goal status: INITIAL   LONG TERM GOALS: Target date: 06/01/2022   Patient will be independent with advanced/ongoing HEP to improve outcomes and carryover.  Baseline:  Goal status: INITIAL  2.  Patient will report at least 75% improvement in right knee pain to improve QOL. Baseline:  Goal status: INITIAL  3.  Patient will demonstrate improved functional LE strength as demonstrated by 5x STS < 14 seconds without increased R knee pain. Baseline: 18.5 seconds, increased R knee pain Goal status: INITIAL  4.  Patient will be able to ambulate 1200' without increased Right knee pain in order to participate in walking for exercise Baseline: 650', increasingly antalgic Goal status: INITIAL  5.  Patient will report 9 points improvement on LEFS to demonstrate improved functional ability. Baseline: 43/80 Goal status: INITIAL  6.  Patient will demonstrate at least 19/24 on DGI to decrease risk of falls. Baseline: NT Goal status: INITIAL    PLAN:  PT FREQUENCY: 1-2x/week  PT DURATION: 8 weeks  PLANNED INTERVENTIONS: Therapeutic exercises, Therapeutic activity, Neuromuscular re-education, Balance training, Gait training, Patient/Family education, Self Care, Joint mobilization, Stair training, Dry Needling, Electrical stimulation, Spinal mobilization,  Cryotherapy, Moist heat, Traction, Ultrasound,  Ionotophoresis '4mg'$ /ml Dexamethasone, Manual therapy, and Re-evaluation  PLAN FOR NEXT SESSION: DGI, berg, review HEP.  Going on vacation x 1 month after next week so focus on [place], PT, DPT  04/06/2022, 12:32 PM

## 2022-04-09 ENCOUNTER — Ambulatory Visit: Payer: Medicare Other | Attending: Cardiovascular Disease

## 2022-04-09 DIAGNOSIS — N289 Disorder of kidney and ureter, unspecified: Secondary | ICD-10-CM

## 2022-04-10 LAB — BASIC METABOLIC PANEL
BUN/Creatinine Ratio: 22 (ref 12–28)
BUN: 24 mg/dL (ref 8–27)
CO2: 24 mmol/L (ref 20–29)
Calcium: 9.7 mg/dL (ref 8.7–10.3)
Chloride: 100 mmol/L (ref 96–106)
Creatinine, Ser: 1.1 mg/dL — ABNORMAL HIGH (ref 0.57–1.00)
Glucose: 85 mg/dL (ref 70–99)
Potassium: 4.4 mmol/L (ref 3.5–5.2)
Sodium: 138 mmol/L (ref 134–144)
eGFR: 50 mL/min/{1.73_m2} — ABNORMAL LOW (ref >59)

## 2022-04-10 NOTE — Therapy (Addendum)
OUTPATIENT PHYSICAL THERAPY TREATMENT   Patient Name: Sheila Schmidt MRN: [id number] DOB:1936-02-13, 86 y.o., female Today's Date: 04/11/2022   PT End of Session - 04/11/22 1022     Visit Number 2    Number of Visits 12    Date for PT Re-Evaluation 05/18/22    Authorization Type UHC Medicare    Progress Note Due on Visit 10    PT Start Time 1020    PT Stop Time 1107    PT Time Calculation (min) 47 min    Activity Tolerance Patient tolerated treatment well    Behavior During Therapy WFL for tasks assessed/performed              Past Medical History:  Diagnosis Date   Cancer ([place])     Left breast carcinoma in situ   Cholelithiasis    Depression    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   Hx of adenomatous colonic polyps    Hyperlipidemia    Past Surgical History:  Procedure Laterality Date   ATRIAL FLUTTER ABLATION     BIV PACEMAKER INSERTION CRT-P N/A 12/04/2021   Procedure: BIV PACEMAKER INSERTION CRT-P;  Surgeon: Vickie Epley, MD;  Location: [facility];  Service: Cardiovascular;  Laterality: N/A;   BREAST EXCISIONAL BIOPSY Left 2010   benign   CARDIOVERSION  05/2020   in [place] N/A 06/21/2021   Procedure: CARDIOVERSION;  Surgeon: Geralynn Rile, MD;  Location: [facility];  Service: Cardiovascular;  Laterality: N/A;   CARDIOVERSION N/A 03/26/2022   Procedure: CARDIOVERSION;  Surgeon: Lelon Perla, MD;  Location: [facility] ENDOSCOPY;  Service: Cardiovascular;  Laterality: N/A;   CHOLECYSTECTOMY     CHOLECYSTECTOMY, LAPAROSCOPIC  '06   [place] ARTHROSCOPY     Left '00 [place]/ Right '08   lumpectomy- remote     benign   TOOTH EXTRACTION     TOTAL KNEE ARTHROPLASTY  02/05/2011   left   Patient Active Problem List   Diagnosis Date Noted   Skin cancer 03/07/2022   Closed nondisplaced fracture of distal phalanx of right thumb 02/15/2022   Atrial fibrillation ([place]) 12/04/2021   Cancer ([place]) 05/25/2021    Left bundle branch block 04/12/2021   Dilated cardiomyopathy ([place]) 04/12/2021   Vasculitis ([place]) 02/22/2021   Anxiety state 02/12/2021   Body fluid retention 02/12/2021   Cervical intraepithelial neoplasia 02/12/2021   Insomnia 02/12/2021   Pruritus of vulva 02/12/2021   Leg wound, right 10/03/2020   Bilateral primary osteoarthritis of knee 03/30/2019   Paroxysmal atrial fibrillation ([place]) 04/07/2015   Chronic anticoagulation 04/07/2015   Paroxysmal atrial flutter ([place]) 03/16/2015   Essential hypertension, benign 02/24/2014   Sciatica 12/08/2013   Hyperlipidemia 09/25/2013   Osteopenia 11/19/2012   Low back pain 01/26/2010   Carcinoma in situ of breast 09/04/2009   DEPRESSION, PROLONGED 09/04/2009   OA (osteoarthritis) of knee 09/04/2009   PERIPHERAL EDEMA 09/04/2009   COLONIC POLYPS, ADENOMATOUS, HX OF 09/04/2009   Malignant tumor of breast ([place]) 06/11/2009    PCP: Darreld Mclean, MD  REFERRING PROVIDER: Rosemarie Ax   REFERRING DIAG: M17.11 (ICD-10-CM) - Primary osteoarthritis of right knee  THERAPY DIAG:  Chronic pain of right knee  Difficulty in walking, not elsewhere classified  Muscle weakness (generalized)  Unsteadiness on feet  Rationale for Evaluation and Treatment: Rehabilitation  ONSET DATE: acute on chronic, worsened after fall 02/13/2022  SUBJECTIVE:   SUBJECTIVE STATEMENT: Romie Minus  E Wiseman reports her right knee hurts and then it doesn't hurt.   PERTINENT HISTORY: Afib, history of cardioversion, L breast cancer, L TKA 2012 PAIN:  Are you having pain? Yes: NPRS scale: 0/10 Pain location: R knee Pain description: ache Aggravating factors: walking Relieving factors: sitting, gel injections  PRECAUTIONS: ICD/Pacemaker, history of breast cancer  WEIGHT BEARING RESTRICTIONS: No  FALLS:  Has patient fallen in last 6 months? Yes. Number of falls 1 fell at casino, tripped and broke thumb  LIVING ENVIRONMENT: Lives with: lives with their  daughter Lives in: House/apartment Stairs:  stairs to basement apartment where daughter lives, rarely goes down, 4-5 steps to garage has railing.  Has following equipment at home: Single point cane  OCCUPATION: retired   PLOF: Independent  PATIENT GOALS: like to get back into walking, I used to walk neighborhood and classes at the gym.     OBJECTIVE:   DIAGNOSTIC FINDINGS: R knee 05/30/2022  PATIENT SURVEYS:  LEFS 43/80 = 54%  COGNITION: Overall cognitive status: Within functional limits for tasks assessed     SENSATION: WFL  EDEMA:  none  MUSCLE LENGTH: NT  POSTURE: rounded shoulders and forward head  PALPATION: No tenderness with palpation R knee  LOWER EXTREMITY ROM: full knee extension bil. >90 deg flexion in sitting bil.    LOWER EXTREMITY MMT:  MMT Right eval Left eval  Hip flexion 4+ 4+  Hip extension 4+ 4+  Hip abduction 5 5  Hip adduction 5 5  Knee flexion 5 5  Knee extension 5 5  Ankle dorsiflexion 5 5  Ankle plantarflexion 5 5   (Blank rows = not tested)  LOWER EXTREMITY SPECIAL TESTS:  NT  FUNCTIONAL TESTS:  5 times sit to stand: 18.5 seconds without UE assist, increased R knee pain.  6 minute walk test: 650' - 1 LOB, .1 seated rest break, HR 60, SpO2 96% returned to 98% quickly, SOB  MCTSIB: Condition 1: Avg of 3 trials: 30 sec, Condition 2: Avg of 3 trials: 30 sec, Condition 3: Avg of 3 trials: 30 sec, Condition 4: Avg of 3 trials: 30 sec, and Total Score: 30/120 increased sway on airex.   SLS - R foot forward 5 seconds, L foot forward 30 seconds.  04/11/22 BERG 51/56 moderate ( >50%) risk for falls DGI Scores of 19 or less are predictive of falls in older community living adults  GAIT: Distance walked: 650' Assistive device utilized: None Level of assistance: Complete Independence Comments: antalgitc gait after 350', lands on midfoot.  Gait speed 0.75 m/s  TODAY'S TREATMENT:                                                                                                                               DATE:   04/11/22: Therapeutic Exercise: to improve flexibility, strength and mobility.  Verbal and tactile cues throughout for technique Nustep L5 x 6 min Fwd step up R 6 inch x  2 painful in R knee Lateral step ups R 6 inch x 4 with bil UE support on railing Semi- tandem balance reviewed  Therapeutic Activites: BERG and DGI completed (see objective)  Gait: 80 feet Encouraged increase step length on L side   04/06/2022: see patient education.      PATIENT EDUCATION:  Education details: HEP update and (self care) discussing importance of compliance with HEP and also focusing on strengthening the R LE for stairs and gait in general.   Person educated: Patient Education method: Explanation, Demonstration, and Handouts Education comprehension: verbalized understanding  HOME EXERCISE PROGRAM: Access Code: [email or web address] URL: [email or web address] Date: 04/11/2022 Prepared by: Almyra Free  Exercises - Heel Toe Raises with Counter Support  - 1 x daily - 7 x weekly - 2 sets - 10 reps - Standing Hip Abduction with Counter Support  - 1 x daily - 7 x weekly - 2 sets - 10 reps - Standing Hip Extension with Counter Support  - 1 x daily - 7 x weekly - 2 sets - 10 reps - Mini Squat with Counter Support  - 1 x daily - 7 x weekly - 2 sets - 10 reps - Standing Single Leg Stance with Counter Support  - 1 x daily - 7 x weekly - 2 sets - 10 reps - Standing Tandem Balance with Counter Support  - 1 x daily - 7 x weekly - 2 sets - 10 reps - Lateral Step Up with Counter Support  - 1 x daily - 7 x weekly - 1-2 sets - 10 reps  ASSESSMENT:  CLINICAL IMPRESSION: Ramina reports intermittent R knee pain and frustration with why it is intermittent. She has been non-compliant with HEP due to preparing for her vacation. BERG and DGI were completed indicating she is a moderate fall risk. Her greatest limitations are with tandem balance  and SLS activities including stairs.She is staying with her daughter in a 3rd floor walk up on her upcoming vacation, so PT focused on getting patient to work on strengthening so that she can progress to a reciprocal gait pattern on stairs. Pt requires encouragement of long term benefit to improve compliance with exercise. Only one exercise was added to HEP for this reason. Pt also encouraged to walk daily. Lillyann continues to demonstrate potential for improvement and would benefit from continued skilled therapy to address impairments.    OBJECTIVE IMPAIRMENTS: Abnormal gait, decreased activity tolerance, decreased balance, decreased endurance, decreased mobility, difficulty walking, decreased strength, and pain.   ACTIVITY LIMITATIONS: standing, squatting, stairs, and locomotion level  PARTICIPATION LIMITATIONS: meal prep, cleaning, laundry, shopping, and community activity  PERSONAL FACTORS: Age, Fitness, Time since onset of injury/illness/exacerbation, and 3+ comorbidities: afib, history of cancer, osteoarthritis, osteopenia  are also affecting patient's functional outcome.   REHAB POTENTIAL: Good  CLINICAL DECISION MAKING: Evolving/moderate complexity  EVALUATION COMPLEXITY: Moderate   GOALS: Goals reviewed with patient? Yes  SHORT TERM GOALS: Target date: 04/20/2022   Patient will be independent with initial HEP. Baseline: given Goal status: INITIAL  2.  Patient will participate in further balance testing (DGI or FGA, Berg) Baseline:  Goal status: MET   LONG TERM GOALS: Target date: 06/01/2022   Patient will be independent with advanced/ongoing HEP to improve outcomes and carryover.  Baseline:  Goal status: INITIAL  2.  Patient will report at least 75% improvement in right knee pain to improve QOL. Baseline:  Goal status: INITIAL  3.  Patient will demonstrate improved functional LE strength as demonstrated  by 5x STS < 14 seconds without increased R knee pain. Baseline:  18.5 seconds, increased R knee pain Goal status: INITIAL  4.  Patient will be able to ambulate 1200' without increased Right knee pain in order to participate in walking for exercise Baseline: 650', increasingly antalgic Goal status: INITIAL  5.  Patient will report 9 points improvement on LEFS to demonstrate improved functional ability. Baseline: 43/80 Goal status: INITIAL  6.  Patient will demonstrate at least 19/24 on DGI to decrease risk of falls. Baseline: NT Goal status: INITIAL    PLAN:  PT FREQUENCY: 1-2x/week  PT DURATION: 8 weeks  PLANNED INTERVENTIONS: Therapeutic exercises, Therapeutic activity, Neuromuscular re-education, Balance training, Gait training, Patient/Family education, Self Care, Joint mobilization, Stair training, Dry Needling, Electrical stimulation, Spinal mobilization, Cryotherapy, Moist heat, Traction, Ultrasound, Ionotophoresis 72m/ml Dexamethasone, Manual therapy, and Re-evaluation  PLAN FOR NEXT SESSION: Review HEP.  Going on vacation x 1 month after next week so focus on HEP   Tejal Monroy, PT 04/11/2022, 12:52 PM   PHYSICAL THERAPY DISCHARGE SUMMARY  Visits from Start of Care: 2  Current functional level related to goals / functional outcomes: No progress due to length of treatment episode before going out of town    Remaining deficits: See above   Education / Equipment: HEP  Plan: Patient agrees to discharge.  Patient goals were not met. Patient is being discharged due to not returning to skilled therapy after 04/11/2022.  Since she has not been seen for 30+ days, she will require new order to return.       ERennie Natter PT, DPT 2:31 PM 07/02/2021

## 2022-04-11 ENCOUNTER — Encounter: Payer: Self-pay | Admitting: Physical Therapy

## 2022-04-11 ENCOUNTER — Encounter: Payer: Self-pay | Admitting: Cardiology

## 2022-04-11 ENCOUNTER — Ambulatory Visit: Payer: Medicare Other | Attending: Family Medicine | Admitting: Physical Therapy

## 2022-04-11 DIAGNOSIS — R2681 Unsteadiness on feet: Secondary | ICD-10-CM | POA: Diagnosis present

## 2022-04-11 DIAGNOSIS — M25561 Pain in right knee: Secondary | ICD-10-CM | POA: Insufficient documentation

## 2022-04-11 DIAGNOSIS — G8929 Other chronic pain: Secondary | ICD-10-CM | POA: Insufficient documentation

## 2022-04-11 DIAGNOSIS — M6281 Muscle weakness (generalized): Secondary | ICD-10-CM | POA: Insufficient documentation

## 2022-04-11 DIAGNOSIS — R262 Difficulty in walking, not elsewhere classified: Secondary | ICD-10-CM | POA: Diagnosis present

## 2022-04-12 ENCOUNTER — Encounter: Payer: Self-pay | Admitting: Family Medicine

## 2022-04-13 ENCOUNTER — Ambulatory Visit: Payer: Medicare Other | Admitting: Physical Therapy

## 2022-04-17 NOTE — Patient Instructions (Incomplete)
It was good to see you again today Recommend the latest COVID-19 booster and a dose of RSV this fall if not done already.  Also consider getting Shingrix-shingles vaccine series  Keep an eye on the wound on your hip; I think it will be ok but keep it covered until it scabs over  Let me know if you are concerned about infection or other concerns   I will see you in about a month   Cut down on alcohol to one drink a day!  Take care

## 2022-04-17 NOTE — Progress Notes (Unsigned)
[place] [facility], [place] [address], [place], [place] [address] [phone]  Date:  04/18/2022   Name:  Sheila Schmidt   DOB:  [birth year]   MRN:  [id number]  PCP:  Darreld Mclean, MD    Chief Complaint: No chief complaint on file.   History of Present Illness:  Sheila Schmidt is a 86 y.o. very pleasant female patient who presents with the following:  Patient seen today with concern of fatigue History of nonischemic cardiomyopathy with pacemaker placement in June of this year, atrial fibrillation, hyperlipidemia, chronic knee pain from arthritis, osteopenia, breast cancer Most recent visit with myself was in September-at that time she had recently fallen while [place].  She had injured her right thumb and noted a fracture, she was seen the same day by sports medicine Dr. Raeford Razor  She had a repeat cardioversion on 10/16  Flu shot COVID booster  Patient Active Problem List   Diagnosis Date Noted   Skin cancer 03/07/2022   Closed nondisplaced fracture of distal phalanx of right thumb 02/15/2022   Atrial fibrillation ([place]) 12/04/2021   Cancer ([place]) 05/25/2021   Left bundle branch block 04/12/2021   Dilated cardiomyopathy ([place]) 04/12/2021   Vasculitis ([place]) 02/22/2021   Anxiety state 02/12/2021   Body fluid retention 02/12/2021   Cervical intraepithelial neoplasia 02/12/2021   Insomnia 02/12/2021   Pruritus of vulva 02/12/2021   Leg wound, right 10/03/2020   Bilateral primary osteoarthritis of knee 03/30/2019   Paroxysmal atrial fibrillation ([place]) 04/07/2015   Chronic anticoagulation 04/07/2015   Paroxysmal atrial flutter ([place]) 03/16/2015   Essential hypertension, benign 02/24/2014   Sciatica 12/08/2013   Hyperlipidemia 09/25/2013   Osteopenia 11/19/2012   Low back pain 01/26/2010   Carcinoma in situ of breast 09/04/2009   DEPRESSION, PROLONGED 09/04/2009   OA (osteoarthritis) of knee 09/04/2009    PERIPHERAL EDEMA 09/04/2009   COLONIC POLYPS, ADENOMATOUS, HX OF 09/04/2009   Malignant tumor of breast ([place]) 06/11/2009    Past Medical History:  Diagnosis Date   Cancer ([place])     Left breast carcinoma in situ   Cholelithiasis    Depression    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   Hx of adenomatous colonic polyps    Hyperlipidemia     Past Surgical History:  Procedure Laterality Date   ATRIAL FLUTTER ABLATION     BIV PACEMAKER INSERTION CRT-P N/A 12/04/2021   Procedure: BIV PACEMAKER INSERTION CRT-P;  Surgeon: Vickie Epley, MD;  Location: [place] [facility];  Service: Cardiovascular;  Laterality: N/A;   BREAST EXCISIONAL BIOPSY Left 2010   benign   CARDIOVERSION  05/2020   in [place] N/A 06/21/2021   Procedure: CARDIOVERSION;  Surgeon: Geralynn Rile, MD;  Location: [place];  Service: Cardiovascular;  Laterality: N/A;   CARDIOVERSION N/A 03/26/2022   Procedure: CARDIOVERSION;  Surgeon: Lelon Perla, MD;  Location: [facility] ENDOSCOPY;  Service: Cardiovascular;  Laterality: N/A;   CHOLECYSTECTOMY     CHOLECYSTECTOMY, LAPAROSCOPIC  '06   [place] ARTHROSCOPY     Left '00 [place]/ Right '08   lumpectomy- remote     benign   TOOTH EXTRACTION     TOTAL KNEE ARTHROPLASTY  02/05/2011   left    Social History   Tobacco Use   Smoking status: Never    Passive exposure: Never   Smokeless tobacco: Never  Vaping Use   Vaping Use: Never used  Substance Use Topics   Alcohol use: Yes    Comment: 1 glass per day   Drug use: No    Family History  Problem Relation Age of Onset   Diabetes Mother        Deceased in 55s   Heart disease Mother        cad/MI-fatal   Heart attack Mother    Cancer Father        liver, Deceased in 41s   Heart disease Sister    Breast cancer Neg Hx    Colon cancer Neg Hx     No Known Allergies  Medication list has been reviewed and updated.  Current Outpatient Medications on File Prior to  Visit  Medication Sig Dispense Refill   acetaminophen (TYLENOL) 500 MG tablet Take 1,000 mg by mouth every 8 (eight) hours as needed for moderate pain.     acetaminophen-codeine (TYLENOL #3) 300-30 MG tablet Take 1 tablet by mouth every 6 (six) hours as needed for moderate pain. (Patient taking differently: Take 1 tablet by mouth every 6 (six) hours as needed for severe pain.) 30 tablet 0   albuterol (VENTOLIN HFA) 108 (90 Base) MCG/ACT inhaler Inhale 2 puffs into the lungs every 6 (six) hours as needed for wheezing or shortness of breath. (Patient not taking: Reported on 04/06/2022) 18 g 6   amiodarone (PACERONE) 200 MG tablet Take 1 tablet (200 mg total) by mouth daily. 30 tablet 6   atorvastatin (LIPITOR) 20 MG tablet Take 1 tablet (20 mg total) by mouth daily. 90 tablet 3   Calcium Carbonate-Vit D-Min (CALTRATE 600+D PLUS MINERALS) 600-800 MG-UNIT CHEW Chew 1 each by mouth daily.     Cholecalciferol 50 MCG (2000 UT) TABS Take 2,000 Units by mouth daily.     famotidine (PEPCID) 40 MG tablet Take 40 mg by mouth daily as needed for heartburn or indigestion.     fluoruracil (CARAC) 0.5 % cream Apply 1 application  topically 2 (two) times daily.     FLUoxetine (PROZAC) 20 MG capsule Take 1 capsule (20 mg total) by mouth daily. 90 capsule 3   melatonin 5 MG TABS Take 5 mg by mouth at bedtime.     rivaroxaban (XARELTO) 20 MG TABS tablet Take 1 tablet (20 mg total) by mouth daily with supper. 90 tablet 3   sacubitril-valsartan (ENTRESTO) 24-26 MG Take 1 tablet by mouth 2 (two) times daily. 180 tablet 3   vitamin B-12 (CYANOCOBALAMIN) 100 MCG tablet Take 100 mcg by mouth daily.     No current facility-administered medications on file prior to visit.    Review of Systems:  ***  Physical Examination: There were no vitals filed for this visit. There were no vitals filed for this visit. There is no height or weight on file to calculate BMI. Ideal Body Weight:    ***  Assessment and  Plan: ***  Signed Lamar Blinks, MD

## 2022-04-18 ENCOUNTER — Ambulatory Visit: Payer: Medicare Other | Admitting: Family Medicine

## 2022-04-18 VITALS — BP 122/62 | HR 60 | Temp 97.8°F | Resp 18 | Ht 62.0 in | Wt 174.2 lb

## 2022-04-18 DIAGNOSIS — S7001XA Contusion of right hip, initial encounter: Secondary | ICD-10-CM

## 2022-04-18 DIAGNOSIS — Z23 Encounter for immunization: Secondary | ICD-10-CM

## 2022-04-18 DIAGNOSIS — F1010 Alcohol abuse, uncomplicated: Secondary | ICD-10-CM | POA: Diagnosis not present

## 2022-04-18 DIAGNOSIS — Y92009 Unspecified place in unspecified non-institutional (private) residence as the place of occurrence of the external cause: Secondary | ICD-10-CM

## 2022-04-18 DIAGNOSIS — W19XXXA Unspecified fall, initial encounter: Secondary | ICD-10-CM

## 2022-04-18 DIAGNOSIS — S2001XA Contusion of right breast, initial encounter: Secondary | ICD-10-CM | POA: Diagnosis not present

## 2022-05-13 ENCOUNTER — Encounter: Payer: Self-pay | Admitting: Family Medicine

## 2022-05-15 DIAGNOSIS — E7800 Pure hypercholesterolemia, unspecified: Secondary | ICD-10-CM | POA: Insufficient documentation

## 2022-05-16 ENCOUNTER — Ambulatory Visit: Payer: Medicare Other | Attending: Cardiology | Admitting: Cardiology

## 2022-05-16 ENCOUNTER — Encounter: Payer: Self-pay | Admitting: Cardiology

## 2022-05-16 VITALS — BP 98/62 | HR 60 | Ht 62.0 in | Wt 178.0 lb

## 2022-05-16 DIAGNOSIS — I480 Paroxysmal atrial fibrillation: Secondary | ICD-10-CM | POA: Diagnosis not present

## 2022-05-16 DIAGNOSIS — I447 Left bundle-branch block, unspecified: Secondary | ICD-10-CM | POA: Diagnosis not present

## 2022-05-16 DIAGNOSIS — I420 Dilated cardiomyopathy: Secondary | ICD-10-CM

## 2022-05-16 NOTE — Patient Instructions (Signed)
Medication Instructions:  Your physician recommends that you continue on your current medications as directed. Please refer to the Current Medication list given to you today.  *If you need a refill on your cardiac medications before your next appointment, please call your pharmacy*   Lab Work: None If you have labs (blood work) drawn today and your tests are completely normal, you will receive your results only by: [place] (if you have MyChart) OR A paper copy in the mail If you have any lab test that is abnormal or we need to change your treatment, we will call you to review the results.   Testing/Procedures: None   Follow-Up: At [facility], you and your health needs are our priority.  As part of our continuing mission to provide you with exceptional heart care, we have created designated Provider Care Teams.  These Care Teams include your primary Cardiologist (physician) and Advanced Practice Providers (APPs -  Physician Assistants and Nurse Practitioners) who all work together to provide you with the care you need, when you need it.  We recommend signing up for the patient portal called "MyChart".  Sign up information is provided on this After Visit Summary.  MyChart is used to connect with patients for Virtual Visits (Telemedicine).  Patients are able to view lab/test results, encounter notes, upcoming appointments, etc.  Non-urgent messages can be sent to your provider as well.   To learn more about what you can do with MyChart, go to [email or web address].    Your next appointment:   3 month(s)  The format for your next appointment:   In Person  Provider:   Jenne Campus, MD    Other Instructions   Important Information About Sugar

## 2022-05-16 NOTE — Progress Notes (Signed)
Cardiology Office Note:    Date:  05/16/2022   ID:  Sheila Schmidt, DOB [birth year], MRN [id number]  PCP:  Darreld Mclean, MD  Cardiologist:  Jenne Campus, MD    Referring MD: Darreld Mclean, MD   Chief Complaint  Patient presents with   follow up cardioversion    History of Present Illness:    Sheila Schmidt is a 86 y.o. female with past medical history significant for paroxysmal atrial fibrillation, status post recent cardioversion maintaining sinus rhythm with amiodarone 200 mg daily, anticoagulated.  Also history of cardiomyopathy with last estimation June with ejection fraction 35%, status post BiV pacemaker done in June.  She comes to my office after she had cardioversion done.  Likely should maintain sinus rhythm.  Overall doing well.  She describes 1 episode of fall that [place] was [place] the beach she was trying to avoid the cat bent forward to grab the cat and then eventually then falling down injuring her head ended up going to the emergency room.  CT of the head did not show any significant issues.  Recovering from that.  Otherwise she says she is feeling better while in normal rhythm does get some shortness of breath fatigue but overall doing quite well  Past Medical History:  Diagnosis Date   Cancer ([place])     Left breast carcinoma in situ   Cholelithiasis    Depression    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   Hx of adenomatous colonic polyps    Hyperlipidemia     Past Surgical History:  Procedure Laterality Date   ATRIAL FLUTTER ABLATION     BIV PACEMAKER INSERTION CRT-P N/A 12/04/2021   Procedure: BIV PACEMAKER INSERTION CRT-P;  Surgeon: Vickie Epley, MD;  Location: [place] [facility];  Service: Cardiovascular;  Laterality: N/A;   BREAST EXCISIONAL BIOPSY Left 2010   benign   CARDIOVERSION  05/2020   in [place] N/A 06/21/2021   Procedure: CARDIOVERSION;  Surgeon: Geralynn Rile, MD;  Location: [facility];   Service: Cardiovascular;  Laterality: N/A;   CARDIOVERSION N/A 03/26/2022   Procedure: CARDIOVERSION;  Surgeon: Lelon Perla, MD;  Location: [facility] ENDOSCOPY;  Service: Cardiovascular;  Laterality: N/A;   CHOLECYSTECTOMY     CHOLECYSTECTOMY, LAPAROSCOPIC  '06   [place] ARTHROSCOPY     Left '00 [place]/ Right '08   lumpectomy- remote     benign   TOOTH EXTRACTION     TOTAL KNEE ARTHROPLASTY  02/05/2011   left    Current Medications: Current Meds  Medication Sig   acetaminophen (TYLENOL) 500 MG tablet Take 1,000 mg by mouth every 8 (eight) hours as needed for moderate pain.   acetaminophen-codeine (TYLENOL #3) 300-30 MG tablet Take 1 tablet by mouth every 6 (six) hours as needed for moderate pain. (Patient taking differently: Take 1 tablet by mouth every 6 (six) hours as needed for severe pain.)   albuterol (VENTOLIN HFA) 108 (90 Base) MCG/ACT inhaler Inhale 2 puffs into the lungs every 6 (six) hours as needed for wheezing or shortness of breath.   amiodarone (PACERONE) 200 MG tablet Take 1 tablet (200 mg total) by mouth daily.   atorvastatin (LIPITOR) 20 MG tablet Take 1 tablet (20 mg total) by mouth daily.   Calcium Carbonate-Vit D-Min (CALTRATE 600+D PLUS MINERALS) 600-800 MG-UNIT CHEW Chew 1 each by mouth daily.   Cholecalciferol 50 MCG (2000 UT) TABS Take 2,000 Units  by mouth daily.   famotidine (PEPCID) 40 MG tablet Take 40 mg by mouth daily as needed for heartburn or indigestion.   fluoruracil (CARAC) 0.5 % cream Apply 1 application  topically 2 (two) times daily.   FLUoxetine (PROZAC) 20 MG capsule Take 1 capsule (20 mg total) by mouth daily.   melatonin 5 MG TABS Take 5 mg by mouth at bedtime.   PREDNISONE PO Take 1 tablet by mouth 2 (two) times daily. 20 mg- tomorrow is her last day   rivaroxaban (XARELTO) 20 MG TABS tablet Take 1 tablet (20 mg total) by mouth daily with supper.   sacubitril-valsartan (ENTRESTO) 24-26 MG Take 1 tablet by mouth 2 (two) times daily.    vitamin B-12 (CYANOCOBALAMIN) 100 MCG tablet Take 100 mcg by mouth daily.     Allergies:   Patient has no known allergies.   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. Married '59, 3 daughters- '61, '64, '73; 2 grandchildren. Exercise - goes to gym 5/wk. ACP - directed to the [email or web address]   Lives with her daughter - goes between daughters home in [place] and her own home here   Social Determinants of Health   Financial Resource Strain: Low Risk  (02/12/2021)   Overall Financial Resource Strain (CARDIA)    Difficulty of Paying Living Expenses: Not hard at all  Food Insecurity: No Food Insecurity (02/12/2021)   Hunger Vital Sign    Worried About Running Out of Food in the Last Year: Never true    [place] in the Last Year: Never true  Transportation Needs: No Transportation Needs (02/12/2021)   PRAPARE - Hydrologist (Medical): No    Lack of Transportation (Non-Medical): No  Physical Activity: Insufficiently Active (02/12/2021)   Exercise Vital Sign    Days of Exercise per Week: 7 days    Minutes of Exercise per Session: 20 min  Stress: No Stress Concern Present (02/12/2021)   [place]    Feeling of Stress : Not at all  Social Connections: Socially Isolated (02/12/2021)   Social Connection  and Isolation Panel [NHANES]    Frequency of Communication with Friends and Family: More than three times a week    Frequency of Social Gatherings with Friends and Family: More than three times a week    Attends Religious Services: Never    Marine scientist or Organizations: No    Attends Archivist Meetings: Never    Marital Status: Widowed     Family History: The patient's family history includes Cancer in her father; Diabetes in her mother; Heart attack in her mother; Heart disease in her mother and sister. There is no  history of Breast cancer or Colon cancer. ROS:   Please see the history of present illness.    All 14 point review of systems negative except as described per history of present illness  EKGs/Labs/Other Studies Reviewed:      Recent Labs: 10/12/2021: Magnesium 1.8 03/21/2022: ALT 13; Hemoglobin 14.3; Platelets 211; TSH 2.270 04/09/2022: BUN 24; Creatinine, Ser 1.10; Potassium 4.4; Sodium 138  Recent Lipid Panel    Component Value Date/Time   CHOL 234 (H) 03/31/2018 1427   TRIG 89.0 03/31/2018 1427   HDL 58.20 03/31/2018 1427   CHOLHDL 4 03/31/2018 1427   VLDL 17.8 03/31/2018 1427   LDLCALC 158 (H) 03/31/2018 1427    Physical Exam:    VS:  BP 98/62 (BP Location: Left Arm, Patient Position: Sitting)   Pulse 60   Ht '5\' 2"'$  (1.575 m)   Wt 178 lb (80.7 kg)   SpO2 93%   BMI 32.56 kg/m     Wt Readings from Last 3 Encounters:  05/16/22 178 lb (80.7 kg)  04/18/22 174 lb 3.2 oz (79 kg)  03/29/22 165 lb (74.8 kg)     GEN:  Well nourished, well developed in no acute distress HEENT: Normal NECK: No JVD; No carotid bruits LYMPHATICS: No lymphadenopathy CARDIAC: RRR, no murmurs, no rubs, no gallops RESPIRATORY:  Clear to auscultation without rales, wheezing or rhonchi  ABDOMEN: Soft, non-tender, non-distended MUSCULOSKELETAL:  No edema; No deformity  SKIN: Warm and dry LOWER EXTREMITIES: no swelling NEUROLOGIC:  Alert and oriented x  3 PSYCHIATRIC:  Normal affect   ASSESSMENT:    1. Dilated cardiomyopathy (HCC)   2. Paroxysmal atrial fibrillation (HCC)   3. Left bundle branch block    PLAN:    In order of problems listed above:  Paroxysmal atrial fibrillation maintaining sinus rhythm feeling better.  Continue anticoagulation continue amiodarone in the future if she maintains sinus rhythm anticipate ability to reduce amiodarone to only 100 mg daily but for now we will continue present dose.  She is scheduled to have pacemaker interrogated in January wait for results of interrogation to see if she got any recurrences of A-fib.  If A-fib still will be a problem we will consider AV nodal ablation. Cardiomyopathy.  Limitation I have is her blood pressure being low.  So she is on small dose of Entresto.  I cannot put her on any more medication because of blood pressure today 98/62.  Will continue present management will check echocardiogram within next few months to see if there is any improvement because of BiV pacing as well as conversion to sinus rhythm.  He will try to push medications higher. History of left bundle branch block status post   Medication Adjustments/Labs and Tests Ordered: Current medicines are reviewed at length with the patient today.  Concerns regarding medicines are outlined above.  No orders of the defined types were placed in this encounter.  Medication changes: No orders of the defined types were placed in this encounter.   Signed, Park Liter, MD, [facility] 05/16/2022 10:53 AM    [place]

## 2022-05-18 ENCOUNTER — Encounter: Payer: Self-pay | Admitting: Family Medicine

## 2022-05-18 NOTE — Progress Notes (Unsigned)
[place] at [facility] [address] [place], [place] [address] [phone]  Date:  05/23/2022   Name:  Sheila Schmidt   DOB:  [birth year]   MRN:  [id number]  PCP:  Darreld Mclean, MD    Chief Complaint: Suture / Staple Removal   History of Present Illness:  Sheila Schmidt is a 86 y.o. very pleasant female patient who presents with the following:  Patient seen today for concern of needing staple removal from scalp Last visit with myself was in November: History of nonischemic cardiomyopathy with pacemaker placement in June of this year (20230, atrial fibrillation, hyperlipidemia, chronic knee pain from arthritis, osteopenia, breast cancer LEFT  Most recent visit with myself was in September-at that time she had recently fallen [place].  She had injured her right thumb and noted a fracture, she was seen the same day by sports medicine Dr. Raeford Razor This seems to be better -she is no longer wearing a cast or splint She had a repeat cardioversion on 10/16- pt reports they had a hard time finding a vein but were finally successful.  The cardioversion worked and as far she knows she is still in sinus rhythm   Her daughter contacted me on 12/3-her mom fell while they were in [facility], was seen in the ER and given 3 staples in her left scalp-they need these removed 7 to 10 days after accident We will remove staples today  However they also note swelling of her left arm for the last 3 days- getting worse,  it started in her hand and has spread up to the elbow and now the upper arm It is not painful or numb They don't think this is related to her fall as it is not painful and started at week after the fall Otherwise she feels pretty well She notes some shortness of breath but this is not really new Breast cancer s/p lumpectomy about 10 years ago  Mammo UTD   Seen by cardiology on 12/6-  Paroxysmal atrial  fibrillation maintaining sinus rhythm feeling better.  Continue anticoagulation continue amiodarone in the future if she maintains sinus rhythm anticipate ability to reduce amiodarone to only 100 mg daily but for now we will continue present dose.  She is scheduled to have pacemaker interrogated in January wait for results of interrogation to see if she got any recurrences of A-fib.  If A-fib still will be a problem we will consider AV nodal ablation. Cardiomyopathy.  Limitation I have is her blood pressure being low.  So she is on small dose of Entresto.  I cannot put her on any more medication because of blood pressure today 98/62.  Will continue present management will check echocardiogram within next few months to see if there is any improvement because of BiV pacing as well as conversion to sinus rhythm.  He will try to push medications higher. History of left bundle branch block status post   Patient Active Problem List   Diagnosis Date Noted   Pure hypercholesterolemia 05/15/2022   Skin cancer 03/07/2022   Closed nondisplaced fracture of distal phalanx of right thumb 02/15/2022   Atrial fibrillation ([place]) 12/04/2021   Cancer ([place]) 05/25/2021   Left bundle branch block 04/12/2021   Dilated cardiomyopathy ([place]) 04/12/2021   Vasculitis ([place]) 02/22/2021   Anxiety state 02/12/2021   Body fluid retention 02/12/2021   Cervical intraepithelial neoplasia 02/12/2021   Insomnia 02/12/2021  Pruritus of vulva 02/12/2021   Leg wound, right 10/03/2020   Bilateral primary osteoarthritis of knee 03/30/2019   Paroxysmal atrial fibrillation ([place]) 04/07/2015   Chronic anticoagulation 04/07/2015   Paroxysmal atrial flutter ([place]) 03/16/2015   Essential hypertension, benign 02/24/2014   Sciatica 12/08/2013   Hyperlipidemia 09/25/2013   Osteopenia 11/19/2012   Low back pain 01/26/2010   Carcinoma in situ of breast 09/04/2009   DEPRESSION, PROLONGED 09/04/2009   OA (osteoarthritis) of knee 09/04/2009    PERIPHERAL EDEMA 09/04/2009   COLONIC POLYPS, ADENOMATOUS, HX OF 09/04/2009   Malignant tumor of breast ([place]) 06/11/2009    Past Medical History:  Diagnosis Date   Cancer ([place])     Left breast carcinoma in situ   Cholelithiasis    Depression    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   Hx of adenomatous colonic polyps    Hyperlipidemia     Past Surgical History:  Procedure Laterality Date   ATRIAL FLUTTER ABLATION     BIV PACEMAKER INSERTION CRT-P N/A 12/04/2021   Procedure: BIV PACEMAKER INSERTION CRT-P;  Surgeon: Vickie Epley, MD;  Location: [facility];  Service: Cardiovascular;  Laterality: N/A;   BREAST EXCISIONAL BIOPSY Left 2010   benign   CARDIOVERSION  05/2020   in [place] N/A 06/21/2021   Procedure: CARDIOVERSION;  Surgeon: Geralynn Rile, MD;  Location: [facility];  Service: Cardiovascular;  Laterality: N/A;   CARDIOVERSION N/A 03/26/2022   Procedure: CARDIOVERSION;  Surgeon: Lelon Perla, MD;  Location: [facility];  Service: Cardiovascular;  Laterality: N/A;   CHOLECYSTECTOMY     CHOLECYSTECTOMY, LAPAROSCOPIC  '06   [facility] ARTHROSCOPY     Left '00 [place]/ Right '08   lumpectomy- remote     benign   TOOTH EXTRACTION     TOTAL KNEE ARTHROPLASTY  02/05/2011   left    Social History   Tobacco Use   Smoking status: Never    Passive exposure: Never   Smokeless tobacco: Never  Vaping Use   Vaping Use: Never used  Substance Use Topics   Alcohol use: Yes    Comment: 1 glass per day   Drug use: No    Family History  Problem Relation Age of Onset   Diabetes Mother        Deceased in 49s   Heart disease Mother        cad/MI-fatal   Heart attack Mother    Cancer Father        liver, Deceased in 29s   Heart disease Sister    Breast cancer Neg Hx    Colon cancer Neg Hx     No Known Allergies  Medication list has been reviewed and updated.  Current Outpatient Medications on File Prior  to Visit  Medication Sig Dispense Refill   acetaminophen (TYLENOL) 500 MG tablet Take 1,000 mg by mouth every 8 (eight) hours as needed for moderate pain.     acetaminophen-codeine (TYLENOL #3) 300-30 MG tablet Take 1 tablet by mouth every 6 (six) hours as needed for moderate pain. (Patient taking differently: Take 1 tablet by mouth every 6 (six) hours as needed for severe pain.) 30 tablet 0   albuterol (VENTOLIN HFA) 108 (90 Base) MCG/ACT inhaler Inhale 2 puffs into the lungs every 6 (six) hours as needed for wheezing or shortness of breath. 18 g 6   amiodarone (PACERONE) 200 MG tablet Take 1 tablet (200 mg  total) by mouth daily. 30 tablet 6   atorvastatin (LIPITOR) 20 MG tablet Take 1 tablet (20 mg total) by mouth daily. 90 tablet 3   Calcium Carbonate-Vit D-Min (CALTRATE 600+D PLUS MINERALS) 600-800 MG-UNIT CHEW Chew 1 each by mouth daily.     Cholecalciferol 50 MCG (2000 UT) TABS Take 2,000 Units by mouth daily.     famotidine (PEPCID) 40 MG tablet Take 40 mg by mouth daily as needed for heartburn or indigestion.     fluoruracil (CARAC) 0.5 % cream Apply 1 application  topically 2 (two) times daily.     FLUoxetine (PROZAC) 20 MG capsule Take 1 capsule (20 mg total) by mouth daily. 90 capsule 3   melatonin 5 MG TABS Take 5 mg by mouth at bedtime.     PREDNISONE PO Take 1 tablet by mouth 2 (two) times daily. 20 mg- tomorrow is her last day     rivaroxaban (XARELTO) 20 MG TABS tablet Take 1 tablet (20 mg total) by mouth daily with supper. 90 tablet 3   sacubitril-valsartan (ENTRESTO) 24-26 MG Take 1 tablet by mouth 2 (two) times daily. 180 tablet 3   vitamin B-12 (CYANOCOBALAMIN) 100 MCG tablet Take 100 mcg by mouth daily.     No current facility-administered medications on file prior to visit.    Review of Systems:  As per HPI- otherwise negative.   Physical Examination: Vitals:   05/23/22 1039  BP: 122/80  Pulse: 80  Resp: 18  Temp: 97.7 F (36.5 C)  SpO2: 93%   There were no  vitals filed for this visit. There is no height or weight on file to calculate BMI. Ideal Body Weight:    GEN: no acute distress. Looks well and her normal self except for swelling in left arm HEENT: Atraumatic, Normocephalic.  Ears and Nose: No external deformity. CV: RRR, No M/G/R. No JVD. No thrill. No extra heart sounds. PULM: CTA B, no wheezes, crackles, rhonchi. No retractions. No resp. distress. No accessory muscle use. ABD: S, NT, ND, +BS. No rebound. No HSM. EXTR: No c/c/e PSYCH: Normally interactive. Conversant.  Removed 3 staples from left scalp- tolerated well, wound is healing normally  Swelling in entire left arm- biceps measures 2cm more than the right side No axillary masses or supraclavicular masses noted      Assessment and Plan: Encounter for staple removal  Swelling of arm Staples removed Referral to ER downstairs for concerning swelling of her left arm- appreciate ER care of this nice patient   Signed Lamar Blinks, MD

## 2022-05-22 ENCOUNTER — Other Ambulatory Visit: Payer: Self-pay | Admitting: Family Medicine

## 2022-05-23 ENCOUNTER — Ambulatory Visit: Payer: Medicare Other | Admitting: Family Medicine

## 2022-05-23 ENCOUNTER — Emergency Department (HOSPITAL_BASED_OUTPATIENT_CLINIC_OR_DEPARTMENT_OTHER): Payer: Medicare Other

## 2022-05-23 ENCOUNTER — Emergency Department (HOSPITAL_BASED_OUTPATIENT_CLINIC_OR_DEPARTMENT_OTHER)
Admission: EM | Admit: 2022-05-23 | Discharge: 2022-05-23 | Disposition: A | Discharge status: Home / Self Care | Payer: Medicare Other | Attending: Emergency Medicine | Admitting: Emergency Medicine

## 2022-05-23 ENCOUNTER — Encounter (HOSPITAL_BASED_OUTPATIENT_CLINIC_OR_DEPARTMENT_OTHER): Payer: Self-pay | Admitting: Pediatrics

## 2022-05-23 ENCOUNTER — Other Ambulatory Visit: Payer: Self-pay

## 2022-05-23 VITALS — BP 122/80 | HR 80 | Temp 97.7°F | Resp 18

## 2022-05-23 DIAGNOSIS — S5012XA Contusion of left forearm, initial encounter: Secondary | ICD-10-CM | POA: Insufficient documentation

## 2022-05-23 DIAGNOSIS — Z7901 Long term (current) use of anticoagulants: Secondary | ICD-10-CM | POA: Diagnosis not present

## 2022-05-23 DIAGNOSIS — W0110XA Fall on same level from slipping, tripping and stumbling with subsequent striking against unspecified object, initial encounter: Secondary | ICD-10-CM | POA: Insufficient documentation

## 2022-05-23 DIAGNOSIS — Z4802 Encounter for removal of sutures: Secondary | ICD-10-CM

## 2022-05-23 DIAGNOSIS — M7989 Other specified soft tissue disorders: Secondary | ICD-10-CM

## 2022-05-23 NOTE — Discharge Instructions (Signed)
You were seen in the emergency department for some left arm swelling and bruising.  This is possibly related to your fall.  Please try to keep arm elevated when possible.  Continue your regular medications.  Return to the emergency department if any worsening or concerning symptoms.

## 2022-05-23 NOTE — ED Notes (Signed)
Unable to obtain blood specimen , 2 RN attempted with no success. MD made aware

## 2022-05-23 NOTE — ED Provider Notes (Signed)
[facility] EMERGENCY DEPARTMENT Provider Note   CSN: [id number] Arrival date & time: 05/23/22  1057     History  Chief Complaint  Patient presents with   Fall   Arm Injury    Sheila Schmidt is a 86 y.o. female.  She was seen in her primary care doctor today and sent down here for evaluation of left arm swelling.  She had a mechanical fall about a week ago who striking her head and having a laceration.  She was seen at outside hospital had a CAT scan of her head and face and had staples placed.  She had the staples removed today but is endorsing some bruising and swelling of her left arm.  She denies any significant pain.  No shortness of breath or chest pain.  She is not sure if he she hit her arm during the fall but did not have any imaging.  She is on blood thinners.  She also has a pacemaker on that left side.  The history is provided by the patient.  Arm Injury Location:  Arm Arm location:  L upper arm Injury: yes   Time since incident:  1 week Mechanism of injury: fall   Fall:    Fall occurred:  Standing Pain details:    Onset quality:  Gradual Handedness:  Right-handed Relieved by:  None tried Worsened by:  Nothing Ineffective treatments:  None tried Associated symptoms: swelling   Associated symptoms: no fever and no neck pain        Home Medications Prior to Admission medications   Medication Sig Start Date End Date Taking? Authorizing Provider  acetaminophen (TYLENOL) 500 MG tablet Take 1,000 mg by mouth every 8 (eight) hours as needed for moderate pain.    [provider]  acetaminophen-codeine (TYLENOL #3) 300-30 MG tablet Take 1 tablet by mouth every 6 (six) hours as needed for moderate pain. Patient taking differently: Take 1 tablet by mouth every 6 (six) hours as needed for severe pain. 05/22/21   [place], Gay Filler, MD  albuterol (VENTOLIN HFA) 108 (90 Base) MCG/ACT inhaler Inhale 2 puffs into the lungs every 6 (six) hours as needed  for wheezing or shortness of breath. 05/22/21   [place], Gay Filler, MD  amiodarone (PACERONE) 200 MG tablet Take 1 tablet (200 mg total) by mouth daily. 01/18/22   Shirley Friar, PA-C  atorvastatin (LIPITOR) 20 MG tablet Take 1 tablet (20 mg total) by mouth daily. 07/09/21   [place], Gay Filler, MD  Calcium Carbonate-Vit D-Min (CALTRATE 600+D PLUS MINERALS) 600-800 MG-UNIT CHEW Chew 1 each by mouth daily.    [provider]  Cholecalciferol 50 MCG (2000 UT) TABS Take 2,000 Units by mouth daily.    [provider]  famotidine (PEPCID) 40 MG tablet Take 40 mg by mouth daily as needed for heartburn or indigestion. 01/02/21   [provider]  fluoruracil (CARAC) 0.5 % cream Apply 1 application  topically 2 (two) times daily.    [provider]  FLUoxetine (PROZAC) 20 MG capsule TAKE 1 CAPSULE BY MOUTH DAILY 05/23/22   [place], Gay Filler, MD  melatonin 5 MG TABS Take 5 mg by mouth at bedtime.    [provider]  PREDNISONE PO Take 1 tablet by mouth 2 (two) times daily. 20 mg- tomorrow is her last day    [provider]  rivaroxaban (XARELTO) 20 MG TABS tablet Take 1 tablet (20 mg total) by mouth daily with supper. 12/10/21  Shirley Friar, PA-C  sacubitril-valsartan (ENTRESTO) 24-26 MG Take 1 tablet by mouth 2 (two) times daily. 08/07/21   [place], Gay Filler, MD  vitamin B-12 (CYANOCOBALAMIN) 100 MCG tablet Take 100 mcg by mouth daily.    [provider]      Allergies    Patient has no known allergies.    Review of Systems   Review of Systems  Constitutional:  Negative for fever.  Respiratory:  Negative for shortness of breath.   Cardiovascular:  Negative for chest pain.  Gastrointestinal:  Negative for nausea and vomiting.  Musculoskeletal:  Negative for neck pain.  Neurological:  Negative for weakness and numbness.    Physical Exam Updated Vital Signs BP 115/79 (BP Location: Right Arm)   Pulse (!) 59   Temp 98.8  F (37.1 C) (Oral)   Resp 17   Ht '5\' 2"'$  (1.575 m)   Wt 80.7 kg   SpO2 97%   BMI 32.56 kg/m  Physical Exam Vitals and nursing note reviewed.  Constitutional:      General: She is not in acute distress.    Appearance: Normal appearance. She is well-developed.  HENT:     Head: Normocephalic and atraumatic.  Eyes:     Conjunctiva/sclera: Conjunctivae normal.  Cardiovascular:     Rate and Rhythm: Normal rate and regular rhythm.     Heart sounds: No murmur heard. Pulmonary:     Effort: Pulmonary effort is normal. No respiratory distress.     Breath sounds: Normal breath sounds.  Abdominal:     Palpations: Abdomen is soft.     Tenderness: There is no abdominal tenderness.  Musculoskeletal:        General: Swelling and tenderness present. Normal range of motion.     Cervical back: Neck supple.     Comments: She has some swelling of her left arm mostly in the upper arm elbow some in the forearm.  She has a few areas of bruising.  Distal pulses motor and sensation intact.  Full range of motion without any bony tenderness or deformity.  Skin:    General: Skin is warm and dry.     Capillary Refill: Capillary refill takes less than 2 seconds.  Neurological:     General: No focal deficit present.     Mental Status: She is alert.     ED Results / Procedures / Treatments   Labs (all labs ordered are listed, but only abnormal results are displayed) Labs Reviewed - No data to display  EKG None  Radiology US Venous Img Upper Left (DVT Study)  Result Date: 05/23/2022 CLINICAL DATA:  Left arm pain and swelling. EXAM: Left UPPER EXTREMITY VENOUS DOPPLER ULTRASOUND TECHNIQUE: Gray-scale sonography with graded compression, as well as color Doppler and duplex ultrasound were performed to evaluate the upper extremity deep venous system from the level of the subclavian vein and including the jugular, axillary, basilic, radial, ulnar and upper cephalic vein. Spectral Doppler was utilized to  evaluate flow at rest and with distal augmentation maneuvers. COMPARISON:  None Available. FINDINGS: Contralateral Subclavian Vein: Respiratory phasicity is normal and symmetric with the symptomatic side. No evidence of thrombus. Normal compressibility. Internal Jugular Vein: No evidence of thrombus. Normal compressibility, respiratory phasicity and response to augmentation. Subclavian Vein: No evidence of thrombus. Normal compressibility, respiratory phasicity and response to augmentation. Axillary Vein: No evidence of thrombus. Normal compressibility, respiratory phasicity and response to augmentation. Cephalic Vein: No evidence of thrombus. Normal compressibility, respiratory phasicity and response  to augmentation. Basilic Vein: No evidence of thrombus. Normal compressibility, respiratory phasicity and response to augmentation. Brachial Veins: No evidence of thrombus. Normal compressibility, respiratory phasicity and response to augmentation. Radial Veins: No evidence of thrombus. Normal compressibility, respiratory phasicity and response to augmentation. Ulnar Veins: No evidence of thrombus. Normal compressibility, respiratory phasicity and response to augmentation. Venous Reflux:  None visualized. Other Findings:  None visualized. IMPRESSION: No evidence of DVT within the left upper extremity. Electronically Signed   By: Marijo Sanes M.D.   On: 05/23/2022 14:24   DG Humerus Left  Result Date: 05/23/2022 CLINICAL DATA:  Left arm swelling EXAM: LEFT HUMERUS - 2+ VIEW COMPARISON:  None Available. FINDINGS: No acute fracture or dislocation. No aggressive osseous lesion. Normal alignment. Generalized osteopenia. Soft tissue are unremarkable. No radiopaque foreign body or soft tissue emphysema. IMPRESSION: 1. No acute osseous injury of the left humerus. Electronically Signed   By: Kathreen Devoid M.D.   On: 05/23/2022 13:12   DG Chest 2 View  Result Date: 05/23/2022 CLINICAL DATA:  Left arm swelling EXAM:  CHEST - 2 VIEW COMPARISON:  12/05/2021 FINDINGS: Bilateral chronic interstitial thickening. No focal consolidation. No pleural effusion or pneumothorax. Stable cardiomegaly. Three lead cardiac pacemaker. No acute osseous abnormality. IMPRESSION: No active cardiopulmonary disease. Electronically Signed   By: Kathreen Devoid M.D.   On: 05/23/2022 13:07    Procedures Procedures    Medications Ordered in ED Medications - No data to display  ED Course/ Medical Decision Making/ A&P                           Medical Decision Making Amount and/or Complexity of Data Reviewed Labs: ordered. Radiology: ordered.   This patient complains of left arm swelling possible trauma; this involves an extensive number of treatment Options and is a complaint that carries with it a high risk of complications and morbidity. The differential includes contusion, fracture, dislocation, DVT, vascular compromise  Labs were ordered but unable to be obtained by nurse. I ordered imaging studies which included x-ray of chest and left humerus, venous ultrasound and I independently    visualized and interpreted imaging which showed no acute findings Additional history obtained from patient's family member Previous records obtained and reviewed in epic including PCP note today  Cardiac monitoring reviewed, normal sinus rhythm Social determinants considered, no significant barriers Critical Interventions: None  After the interventions stated above, I reevaluated the patient and found patient to be fairly asymptomatic at rest. Admission and further testing considered, no indications for admission or further workup at this time.  No significant findings during current workup.  Feel this likely represents contusion from prior fall and dependent edema.  Recommended symptomatic treatment and follow-up with PCP.  Return instructions discussed         Final Clinical Impression(s) / ED Diagnoses Final diagnoses:  Left arm  swelling    Rx / DC Orders ED Discharge Orders     None         Hayden Rasmussen, MD 05/23/22 1754

## 2022-05-23 NOTE — Patient Instructions (Signed)
Please go to the ER downstairs so we can try and determine why your arm is swelling!  The wound on your head appears to be healing well!

## 2022-05-23 NOTE — ED Triage Notes (Signed)
C/O left arm swelling; reported mechanical fall on 12/3 landed on left side.

## 2022-05-28 ENCOUNTER — Telehealth: Payer: Self-pay | Admitting: Cardiology

## 2022-05-28 ENCOUNTER — Encounter: Payer: Self-pay | Admitting: Family Medicine

## 2022-05-28 DIAGNOSIS — R600 Localized edema: Secondary | ICD-10-CM

## 2022-05-28 NOTE — Telephone Encounter (Signed)
Pt c/o swelling: STAT is pt has developed SOB within 24 hours  If swelling, where is the swelling located? Feet and ankles  How much weight have you gained and in what time span? Not any  Have you gained 3 pounds in a day or 5 pounds in a week?   Do you have a log of your daily weights (if so, list)?   Are you currently taking a fluid pill? no  Are you currently SOB? no  Have you traveled recently? No- getting ready to travel on Wednesday to [facility].

## 2022-05-28 NOTE — Telephone Encounter (Signed)
Spoke with pt and her daughter who states that the pt is having lower extremity swelling. Pt denies shortness of breath. Pt states that there is no difference in the swelling from night to morning. Pt had a fall recently where she was seen in the ED. Notes are in Epic.

## 2022-05-31 NOTE — Telephone Encounter (Signed)
Advised to have labs if possible while on trip. Sheila Schmidt per DPR states that her mother is now wearing compression hose and one leg is back to normal and the other leg is better but has some swelling. Advised to decrease salt in diet. Sheila Schmidt verbalized understanding and had no additional questions.

## 2022-05-31 NOTE — Addendum Note (Signed)
Addended by: Truddie Hidden on: 05/31/2022 11:04 AM   Modules accepted: Orders

## 2022-06-05 ENCOUNTER — Ambulatory Visit (INDEPENDENT_AMBULATORY_CARE_PROVIDER_SITE_OTHER): Payer: Medicare Other

## 2022-06-05 DIAGNOSIS — I495 Sick sinus syndrome: Secondary | ICD-10-CM | POA: Diagnosis not present

## 2022-06-06 ENCOUNTER — Encounter: Payer: Self-pay | Admitting: Family Medicine

## 2022-06-06 LAB — CUP PACEART REMOTE DEVICE CHECK
Battery Remaining Longevity: 50 mo
Battery Remaining Percentage: 94 %
Battery Voltage: 2.98 V
Brady Statistic AP VP Percent: 87 %
Brady Statistic AP VS Percent: 1 %
Brady Statistic AS VP Percent: 3.8 %
Brady Statistic AS VS Percent: 8.7 %
Brady Statistic RA Percent Paced: 76 %
Date Time Interrogation Session: 20231226040017
Implantable Lead Connection Status: 753985
Implantable Lead Connection Status: 753985
Implantable Lead Connection Status: 753985
Implantable Lead Implant Date: 20230626
Implantable Lead Implant Date: 20230626
Implantable Lead Implant Date: 20230626
Implantable Lead Location: 753858
Implantable Lead Location: 753859
Implantable Lead Location: 753860
Implantable Pulse Generator Implant Date: 20230626
Lead Channel Impedance Value: 400 Ohm
Lead Channel Impedance Value: 530 Ohm
Lead Channel Impedance Value: 900 Ohm
Lead Channel Pacing Threshold Amplitude: 0.75 V
Lead Channel Pacing Threshold Amplitude: 1.125 V
Lead Channel Pacing Threshold Pulse Width: 0.5 ms
Lead Channel Pacing Threshold Pulse Width: 0.5 ms
Lead Channel Sensing Intrinsic Amplitude: 1.2 mV
Lead Channel Sensing Intrinsic Amplitude: 10.3 mV
Lead Channel Setting Pacing Amplitude: 2.125
Lead Channel Setting Pacing Amplitude: 3.5 V
Lead Channel Setting Pacing Amplitude: 3.5 V
Lead Channel Setting Pacing Pulse Width: 0.5 ms
Lead Channel Setting Pacing Pulse Width: 0.5 ms
Lead Channel Setting Sensing Sensitivity: 2 mV
Pulse Gen Model: 3562
Pulse Gen Serial Number: 8071621

## 2022-06-11 ENCOUNTER — Encounter: Payer: Self-pay | Admitting: Cardiology

## 2022-06-11 ENCOUNTER — Encounter: Payer: Self-pay | Admitting: Family Medicine

## 2022-06-14 ENCOUNTER — Encounter: Payer: Self-pay | Admitting: Family Medicine

## 2022-06-14 ENCOUNTER — Encounter: Payer: Self-pay | Admitting: Cardiology

## 2022-06-17 NOTE — Patient Instructions (Incomplete)
It was good to see you again today, I am sorry you have had such bad luck recently  Please go to the lab, we will get the blood work that Dr. Raliegh Ip had requested.  Then please go to imaging on the ground floor to get your chest x-ray I will be in touch with your results asap  Mucinex may make it easier for you to bring up mucus from your chest Ok to use hycodan syrup as needed for cough- however, this will make you drowsy. Do not use with any other sedating substance such as tylenol with codeine or alcohol

## 2022-06-17 NOTE — Progress Notes (Unsigned)
[facility] [address] [place], [place] [address] [phone]  Date:  06/18/2022   Name:  Sheila Schmidt   DOB:  1936/02/28   MRN:  [id number]  PCP:  Sheila Mclean, MD    Chief Complaint: No chief complaint on file.   History of Present Illness:  Sheila Schmidt is a 87 y.o. very pleasant female patient who presents with the following:  Patient seen today for follow-up of recent illness History of nonischemic cardiomyopathy with pacemaker placement in June of this year (20230, atrial fibrillation, hyperlipidemia, chronic knee pain from arthritis, osteopenia, breast cancer LEFT  She had a repeat cardioversion on 10/16- pt reports they had a hard time finding a vein but were finally successful.  The cardioversion worked and as far she knows she is still in sinus rhythm  I saw her most recently on 12/13 for follow-up, she had fallen while attending [facility] and is some staples removed from her scalp.  She also had left arm swelling which was quite concerning to me, however in the ER it turned out this was just musculoskeletal  They contacted me via [place] over the holidays-message 12/27, patient was ill with coughing, aching, fever while they were again out of town They went to urgent care on 12/27, tested negative for COVID flu.  Chest x-ray showed pneumonia She was given inhaler, azithromycin and amoxicillin? They made their way back to [facility] On January 2 I recommended they stop antibiotics a little bit early due to diarrhea  MyChart message again yesterday, patient still coughing-we made this appointment for recheck Patient Active Problem List   Diagnosis Date Noted   Pure hypercholesterolemia 05/15/2022   Skin cancer 03/07/2022   Closed nondisplaced fracture of distal phalanx of right thumb 02/15/2022   Atrial fibrillation ([place]) 12/04/2021   Cancer ([place]) 05/25/2021   Left bundle branch block  04/12/2021   Dilated cardiomyopathy ([place]) 04/12/2021   Vasculitis ([place]) 02/22/2021   Anxiety state 02/12/2021   Body fluid retention 02/12/2021   Cervical intraepithelial neoplasia 02/12/2021   Insomnia 02/12/2021   Pruritus of vulva 02/12/2021   Leg wound, right 10/03/2020   Bilateral primary osteoarthritis of knee 03/30/2019   Paroxysmal atrial fibrillation ([place]) 04/07/2015   Chronic anticoagulation 04/07/2015   Paroxysmal atrial flutter ([place]) 03/16/2015   Essential hypertension, benign 02/24/2014   Sciatica 12/08/2013   Hyperlipidemia 09/25/2013   Osteopenia 11/19/2012   Low back pain 01/26/2010   Carcinoma in situ of breast 09/04/2009   DEPRESSION, PROLONGED 09/04/2009   OA (osteoarthritis) of knee 09/04/2009   PERIPHERAL EDEMA 09/04/2009   COLONIC POLYPS, ADENOMATOUS, HX OF 09/04/2009   Malignant tumor of breast ([place]) 06/11/2009    Past Medical History:  Diagnosis Date   Cancer ([place])     Left breast carcinoma in situ   Cholelithiasis    Depression    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   Hx of adenomatous colonic polyps    Hyperlipidemia     Past Surgical History:  Procedure Laterality Date   ATRIAL FLUTTER ABLATION     BIV PACEMAKER INSERTION CRT-P N/A 12/04/2021   Procedure: BIV PACEMAKER INSERTION CRT-P;  Surgeon: Vickie Epley, MD;  Location: [place] [facility];  Service: Cardiovascular;  Laterality: N/A;   BREAST EXCISIONAL BIOPSY Left 2010   benign   CARDIOVERSION  05/2020   in [place] N/A 06/21/2021  Procedure: CARDIOVERSION;  Surgeon: Geralynn Rile, MD;  Location: [place];  Service: Cardiovascular;  Laterality: N/A;   CARDIOVERSION N/A 03/26/2022   Procedure: CARDIOVERSION;  Surgeon: Lelon Perla, MD;  Location: [facility] ENDOSCOPY;  Service: Cardiovascular;  Laterality: N/A;   CHOLECYSTECTOMY     CHOLECYSTECTOMY, LAPAROSCOPIC  '06   [place] ARTHROSCOPY     Left '00 [place]/ Right '08   lumpectomy-  remote     benign   TOOTH EXTRACTION     TOTAL KNEE ARTHROPLASTY  02/05/2011   left    Social History   Tobacco Use   Smoking status: Never    Passive exposure: Never   Smokeless tobacco: Never  Vaping Use   Vaping Use: Never used  Substance Use Topics   Alcohol use: Yes    Comment: 1 glass per day   Drug use: No    Family History  Problem Relation Age of Onset   Diabetes Mother        Deceased in 18s   Heart disease Mother        cad/MI-fatal   Heart attack Mother    Cancer Father        liver, Deceased in 80s   Heart disease Sister    Breast cancer Neg Hx    Colon cancer Neg Hx     No Known Allergies  Medication list has been reviewed and updated.  Current Outpatient Medications on File Prior to Visit  Medication Sig Dispense Refill   acetaminophen (TYLENOL) 500 MG tablet Take 1,000 mg by mouth every 8 (eight) hours as needed for moderate pain.     acetaminophen-codeine (TYLENOL #3) 300-30 MG tablet Take 1 tablet by mouth every 6 (six) hours as needed for moderate pain. (Patient taking differently: Take 1 tablet by mouth every 6 (six) hours as needed for severe pain.) 30 tablet 0   albuterol (VENTOLIN HFA) 108 (90 Base) MCG/ACT inhaler Inhale 2 puffs into the lungs every 6 (six) hours as needed for wheezing or shortness of breath. 18 g 6   amiodarone (PACERONE) 200 MG tablet Take 1 tablet (200 mg total) by mouth daily. 30 tablet 6   atorvastatin (LIPITOR) 20 MG tablet Take 1 tablet (20 mg total) by mouth daily. 90 tablet 3   Calcium Carbonate-Vit D-Min (CALTRATE 600+D PLUS MINERALS) 600-800 MG-UNIT CHEW Chew 1 each by mouth daily.     Cholecalciferol 50 MCG (2000 UT) TABS Take 2,000 Units by mouth daily.     famotidine (PEPCID) 40 MG tablet Take 40 mg by mouth daily as needed for heartburn or indigestion.     fluoruracil (CARAC) 0.5 % cream Apply 1 application  topically 2 (two) times daily.     FLUoxetine (PROZAC) 20 MG capsule TAKE 1 CAPSULE BY MOUTH DAILY 90  capsule 3   melatonin 5 MG TABS Take 5 mg by mouth at bedtime.     PREDNISONE PO Take 1 tablet by mouth 2 (two) times daily. 20 mg- tomorrow is her last day     rivaroxaban (XARELTO) 20 MG TABS tablet Take 1 tablet (20 mg total) by mouth daily with supper. 90 tablet 3   sacubitril-valsartan (ENTRESTO) 24-26 MG Take 1 tablet by mouth 2 (two) times daily. 180 tablet 3   vitamin B-12 (CYANOCOBALAMIN) 100 MCG tablet Take 100 mcg by mouth daily.     No current facility-administered medications on file prior to visit.    Review of Systems:  As per HPI- otherwise  negative.   Physical Examination: There were no vitals filed for this visit. There were no vitals filed for this visit. There is no height or weight on file to calculate BMI. Ideal Body Weight:    GEN: no acute distress. HEENT: Atraumatic, Normocephalic.  Ears and Nose: No external deformity. CV: RRR, No M/G/R. No JVD. No thrill. No extra heart sounds. PULM: CTA B, no wheezes, crackles, rhonchi. No retractions. No resp. distress. No accessory muscle use. ABD: S, NT, ND, +BS. No rebound. No HSM. EXTR: No c/c/e PSYCH: Normally interactive. Conversant.    Assessment and Plan: ***  Signed Lamar Blinks, MD

## 2022-06-18 ENCOUNTER — Encounter: Payer: Self-pay | Admitting: Family Medicine

## 2022-06-18 ENCOUNTER — Ambulatory Visit: Payer: Medicare Other | Admitting: Family Medicine

## 2022-06-18 ENCOUNTER — Other Ambulatory Visit (HOSPITAL_BASED_OUTPATIENT_CLINIC_OR_DEPARTMENT_OTHER): Payer: Self-pay | Admitting: Family Medicine

## 2022-06-18 ENCOUNTER — Ambulatory Visit (HOSPITAL_BASED_OUTPATIENT_CLINIC_OR_DEPARTMENT_OTHER)
Admission: RE | Admit: 2022-06-18 | Discharge: 2022-06-18 | Disposition: A | Discharge status: Home / Self Care | Payer: Medicare Other | Source: Ambulatory Visit | Attending: Family Medicine | Admitting: Family Medicine

## 2022-06-18 VITALS — BP 122/70 | HR 60 | Temp 97.6°F | Resp 18 | Ht 62.0 in | Wt 178.6 lb

## 2022-06-18 DIAGNOSIS — J189 Pneumonia, unspecified organism: Secondary | ICD-10-CM | POA: Diagnosis present

## 2022-06-18 DIAGNOSIS — Z1231 Encounter for screening mammogram for malignant neoplasm of breast: Secondary | ICD-10-CM

## 2022-06-18 DIAGNOSIS — R600 Localized edema: Secondary | ICD-10-CM | POA: Diagnosis not present

## 2022-06-18 LAB — BASIC METABOLIC PANEL
BUN: 15 mg/dL (ref 6–23)
CO2: 29 mEq/L (ref 19–32)
Calcium: 9 mg/dL (ref 8.4–10.5)
Chloride: 105 mEq/L (ref 96–112)
Creatinine, Ser: 0.96 mg/dL (ref 0.40–1.20)
GFR: 54.38 mL/min — ABNORMAL LOW (ref >60.00)
Glucose, Bld: 100 mg/dL — ABNORMAL HIGH (ref 70–99)
Potassium: 3.6 mEq/L (ref 3.5–5.1)
Sodium: 142 mEq/L (ref 135–145)

## 2022-06-18 LAB — BRAIN NATRIURETIC PEPTIDE: Pro B Natriuretic peptide (BNP): 326 pg/mL — ABNORMAL HIGH (ref 0.0–100.0)

## 2022-06-18 MED ORDER — HYDROCODONE BIT-HOMATROP MBR 5-1.5 MG/5ML PO SOLN: 5.0000 mL | Freq: Three times a day (TID) | ORAL | 0 refills | Status: AC | PRN

## 2022-06-18 MED ORDER — CEFDINIR 300 MG PO CAPS: 300.0000 mg | ORAL_CAPSULE | Freq: Two times a day (BID) | ORAL | 0 refills | Status: DC

## 2022-06-19 ENCOUNTER — Encounter: Payer: Self-pay | Admitting: Family Medicine

## 2022-06-21 ENCOUNTER — Encounter: Payer: Self-pay | Admitting: Cardiology

## 2022-06-21 ENCOUNTER — Other Ambulatory Visit: Payer: Self-pay

## 2022-06-21 MED ORDER — AMIODARONE HCL 200 MG PO TABS: 200.0000 mg | ORAL_TABLET | Freq: Every day | ORAL | 6 refills | Status: DC

## 2022-06-21 NOTE — Telephone Encounter (Signed)
Amidarone '200mg'$  1 tablet daily- sent to pharmacy

## 2022-06-24 NOTE — Progress Notes (Unsigned)
**Note Sheila-Identified via Obfuscation** Cardiology Office Note Date:  06/25/2022  Patient ID:  Sheila Schmidt, DOB 07/31/1935, MRN [id number] PCP:  Darreld Mclean, MD  Cardiologist:  Dr. Agustin Cree Electrophysiologist: Dr. Quentin Ore    Chief Complaint: 3 mo  History of Present Illness: DEMARA Schmidt is a 87 y.o. female with history of LBBB, NICM, AFlutter, HLD  She saw Dr. Quentin Ore 03/16/22, 100 AFlutter burden with reports of some episodes of lightheadedness (suspected to be orthostatics hypotension by notes), recently started on amiodarone (several weeks) and planned to pursue DCCV.  Discussed if recurrent arrhythmia post DCCV on amiodarone, next step would be AVN ablation.  DCCV 03/26/22, successful  Saw Dr. Agustin Cree 05/16/22, discussed BP a limiter for her GDMT.  05/23/22, ER visit after a fall, described as mechanical a week prior, treated elsewhere, this visit with some L arm swelling/bruising, w/u r/o fracture, discharged  06/18/22, PMD visit, found with PNA  TODAY She is accompanied by her daughter They have been traveling quite a lot mentioning trips to [facility], [place], and [place] the last few months. The last trip getting caught up in some wet weather and caught a cold/PNA, given Augmentin originally at the [facility] out of town, CXR last week with Dr. Edilia Bo with residual PNA and her antibiotic changed. She remains with a very persistent and intermittently productive cough. The wheezing better. Last night finally slept pretty good with reduced PM coughing  Prior to this illness denied any SOB, cough No CP No near syncope or syncope.  She has had 3 falls since August 1st tripped on an extension cord 2nd, got up from bed to use the BR, got lightheaded and fell, did not faint, her daughter add that she has also been drinking that evening. And the last tripped over her other daughter's cat, this she did require laceration repair and evaluation of arm pain (no fracture) No other falls in the last  year  No bleeding or signs of bleeding, she has good medication compliance  Device information Abbott CRT-P, implanted 12/04/2021  AFib/flutter: AAD hx Amiodarone, started approx Aug 2023  Past Medical History:  Diagnosis Date   Cancer [facility])     Left breast carcinoma in situ   Cholelithiasis    Depression    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   Hx of adenomatous colonic polyps    Hyperlipidemia     Past Surgical History:  Procedure Laterality Date   ATRIAL FLUTTER ABLATION     BIV PACEMAKER INSERTION CRT-P N/A 12/04/2021   Procedure: BIV PACEMAKER INSERTION CRT-P;  Surgeon: Vickie Epley, MD;  Location: [place] CV LAB;  Service: Cardiovascular;  Laterality: N/A;   BREAST EXCISIONAL BIOPSY Left 2010   benign   CARDIOVERSION  05/2020   in [place] N/A 06/21/2021   Procedure: CARDIOVERSION;  Surgeon: Geralynn Rile, MD;  Location: [place];  Service: Cardiovascular;  Laterality: N/A;   CARDIOVERSION N/A 03/26/2022   Procedure: CARDIOVERSION;  Surgeon: Lelon Perla, MD;  Location: [facility] ENDOSCOPY;  Service: Cardiovascular;  Laterality: N/A;   CHOLECYSTECTOMY     CHOLECYSTECTOMY, LAPAROSCOPIC  '06   [place] ARTHROSCOPY     Left '00 [place]/ Right '08   lumpectomy- remote     benign   TOOTH EXTRACTION     TOTAL KNEE ARTHROPLASTY  02/05/2011   left    Current Outpatient Medications  Medication Sig Dispense Refill   acetaminophen (TYLENOL) 500 MG  tablet Take 1,000 mg by mouth every 8 (eight) hours as needed for moderate pain.     acetaminophen-codeine (TYLENOL #3) 300-30 MG tablet Take 1 tablet by mouth every 6 (six) hours as needed for moderate pain. 30 tablet 0   albuterol (VENTOLIN HFA) 108 (90 Base) MCG/ACT inhaler Inhale 2 puffs into the lungs every 6 (six) hours as needed for wheezing or shortness of breath. 18 g 6   amiodarone (PACERONE) 200 MG tablet Take 1 tablet (200 mg total) by mouth daily. 30 tablet 6    atorvastatin (LIPITOR) 20 MG tablet Take 1 tablet (20 mg total) by mouth daily. 90 tablet 3   Calcium Carbonate-Vit D-Min (CALTRATE 600+D PLUS MINERALS) 600-800 MG-UNIT CHEW Chew 1 each by mouth daily.     cefdinir (OMNICEF) 300 MG capsule Take 1 capsule (300 mg total) by mouth 2 (two) times daily. 20 capsule 0   Cholecalciferol 50 MCG (2000 UT) TABS Take 2,000 Units by mouth daily.     famotidine (PEPCID) 40 MG tablet Take 40 mg by mouth daily as needed for heartburn or indigestion.     FLUoxetine (PROZAC) 20 MG capsule TAKE 1 CAPSULE BY MOUTH DAILY 90 capsule 3   melatonin 5 MG TABS Take 5 mg by mouth at bedtime.     rivaroxaban (XARELTO) 20 MG TABS tablet Take 1 tablet (20 mg total) by mouth daily with supper. 90 tablet 3   sacubitril-valsartan (ENTRESTO) 24-26 MG Take 1 tablet by mouth 2 (two) times daily. 180 tablet 3   vitamin B-12 (CYANOCOBALAMIN) 100 MCG tablet Take 100 mcg by mouth daily.     No current facility-administered medications for this visit.    Allergies:   Patient has no known allergies.   Social History:  The patient  reports that she has never smoked. She has never been exposed to tobacco smoke. She has never used smokeless tobacco. She reports current alcohol use. She reports that she does not use drugs.   Family History:  The patient's family history includes Cancer in her father; Diabetes in her mother; Heart attack in her mother; Heart disease in her mother and sister.  ROS:  Please see the history of present illness.    All other systems are reviewed and otherwise negative.   PHYSICAL EXAM:  VS:  BP 121/60   Pulse 60   Ht '5\' 2"'$  (1.575 m)   Wt 177 lb 6.4 oz (80.5 kg)   SpO2 92%   BMI 32.45 kg/m  BMI: Body mass index is 32.45 kg/m. Well nourished, well developed, in no acute distress HEENT: normocephalic, atraumatic Neck: no JVD, carotid bruits or masses Cardiac:  RRR; no significant murmurs, no rubs, or gallops Lungs:  \has some rhochi at the bases, very  end exp wheezes, moving air well, no rales Abd: soft, nontender MS: no deformity or  atrophy Ext: trace edema Skin: warm and dry, no rash Neuro:  No gross deficits appreciated Psych: euthymic mood, full affect  PPM site is stable, no tethering or discomfort   EKG:  Done today and reviewed by myself shows  AV paced  Device interrogation done today and reviewed by myself:  Battery and lead measurements are good RA/RV lead outputs adjusted down from acute implant settings LV lead was on auto threshold Measurements are all on No VT No AFib since her DCCV BP 86% (this includes prior to her DCCV  Recent Labs: 10/12/2021: Magnesium 1.8 03/21/2022: ALT 13; Hemoglobin 14.3; Platelets 211; TSH 2.270 06/18/2022:  BUN 15; Creatinine, Ser 0.96; Potassium 3.6; Pro B Natriuretic peptide (BNP) 326.0; Sodium 142  No results found for requested labs within last 365 days.   Estimated Creatinine Clearance: 42.9 mL/min (by C-G formula based on SCr of 0.96 mg/dL).   Wt Readings from Last 3 Encounters:  06/25/22 177 lb 6.4 oz (80.5 kg)  06/18/22 178 lb 9.6 oz (81 kg)  05/23/22 178 lb (80.7 kg)     Other studies reviewed: Additional studies/records reviewed today include: summarized above  ASSESSMENT AND PLAN:  CRT-P Intact function She should get some battery improvement with reduction in outputs.  NICM LBBB Chronic CHF Not entirely clear that she is volume OL Still has a cough from her PNA CoreVue is below threshold though PNA can also cause this. She has reached out to PMD office already with cough ongoing. No fever I will send a note to Dr. Edilia Bo. She is not on a diuretic, not entirely convinced this is volume driven    Persistent AFib CHA2DS2Vasc is 4, on Xarelto, appropriately dosed None since her DCCV Amiodarone is chronic, labs are UTD  Secondary hypercoagulable state   Disposition: F/u with [place] in 34mo sooner if needed.    Current medicines are reviewed at length  with the patient today.  The patient did not have any concerns regarding medicines.  SVenetia Night PA-C 06/25/2022 12:29 PM     [place] [phone](office)  [phone](fax)

## 2022-06-25 ENCOUNTER — Ambulatory Visit: Payer: Medicare Other | Attending: Physician Assistant | Admitting: Physician Assistant

## 2022-06-25 ENCOUNTER — Encounter: Payer: Self-pay | Admitting: Physician Assistant

## 2022-06-25 VITALS — BP 121/60 | HR 60 | Ht 62.0 in | Wt 177.4 lb

## 2022-06-25 DIAGNOSIS — I4819 Other persistent atrial fibrillation: Secondary | ICD-10-CM | POA: Diagnosis not present

## 2022-06-25 DIAGNOSIS — I5022 Chronic systolic (congestive) heart failure: Secondary | ICD-10-CM

## 2022-06-25 DIAGNOSIS — Z950 Presence of cardiac pacemaker: Secondary | ICD-10-CM | POA: Diagnosis not present

## 2022-06-25 DIAGNOSIS — I428 Other cardiomyopathies: Secondary | ICD-10-CM | POA: Diagnosis not present

## 2022-06-25 DIAGNOSIS — I447 Left bundle-branch block, unspecified: Secondary | ICD-10-CM | POA: Diagnosis not present

## 2022-06-25 DIAGNOSIS — D6869 Other thrombophilia: Secondary | ICD-10-CM

## 2022-06-25 LAB — CUP PACEART INCLINIC DEVICE CHECK
Battery Remaining Longevity: 78 mo
Battery Voltage: 2.99 V
Brady Statistic RA Percent Paced: 75 %
Brady Statistic RV Percent Paced: 86 %
Date Time Interrogation Session: 20240115124901
Implantable Lead Connection Status: 753985
Implantable Lead Connection Status: 753985
Implantable Lead Connection Status: 753985
Implantable Lead Implant Date: 20230626
Implantable Lead Implant Date: 20230626
Implantable Lead Implant Date: 20230626
Implantable Lead Location: 753858
Implantable Lead Location: 753859
Implantable Lead Location: 753860
Implantable Pulse Generator Implant Date: 20230626
Lead Channel Impedance Value: 400 Ohm
Lead Channel Impedance Value: 512.5 Ohm
Lead Channel Impedance Value: 837.5 Ohm
Lead Channel Pacing Threshold Amplitude: 0.5 V
Lead Channel Pacing Threshold Amplitude: 0.5 V
Lead Channel Pacing Threshold Amplitude: 0.75 V
Lead Channel Pacing Threshold Amplitude: 0.75 V
Lead Channel Pacing Threshold Amplitude: 0.875 V
Lead Channel Pacing Threshold Pulse Width: 0.5 ms
Lead Channel Pacing Threshold Pulse Width: 0.5 ms
Lead Channel Pacing Threshold Pulse Width: 0.5 ms
Lead Channel Pacing Threshold Pulse Width: 0.5 ms
Lead Channel Pacing Threshold Pulse Width: 0.5 ms
Lead Channel Sensing Intrinsic Amplitude: 1.5 mV
Lead Channel Sensing Intrinsic Amplitude: 12 mV
Lead Channel Setting Pacing Amplitude: 2 V
Lead Channel Setting Pacing Amplitude: 2.5 V
Lead Channel Setting Pacing Amplitude: 2.5 V
Lead Channel Setting Pacing Pulse Width: 0.5 ms
Lead Channel Setting Pacing Pulse Width: 0.5 ms
Lead Channel Setting Sensing Sensitivity: 2 mV
Pulse Gen Model: 3562
Pulse Gen Serial Number: 8071621

## 2022-06-25 NOTE — Patient Instructions (Signed)
Medication Instructions:   Your physician recommends that you continue on your current medications as directed. Please refer to the Current Medication list given to you today.   *If you need a refill on your cardiac medications before your next appointment, please call your pharmacy*   Lab Work: NONE ORDERED  TODAY   If you have labs (blood work) drawn today and your tests are completely normal, you will receive your results only by: MyChart Message (if you have MyChart) OR A paper copy in the mail If you have any lab test that is abnormal or we need to change your treatment, we will call you to review the results.   Testing/Procedures: NONE ORDERED  TODAY     Follow-Up: At [facility], you and your health needs are our priority.  As part of our continuing mission to provide you with exceptional heart care, we have created designated Provider Care Teams.  These Care Teams include your primary Cardiologist (physician) and Advanced Practice Providers (APPs -  Physician Assistants and Nurse Practitioners) who all work together to provide you with the care you need, when you need it.  We recommend signing up for the patient portal called "MyChart".  Sign up information is provided on this After Visit Summary.  MyChart is used to connect with patients for Virtual Visits (Telemedicine).  Patients are able to view lab/test results, encounter notes, upcoming appointments, etc.  Non-urgent messages can be sent to your provider as well.   To learn more about what you can do with MyChart, go to [email or web address].    Your next appointment:   3 month(s)  Provider:   Renee Ursuy, PA-C    Other Instructions  

## 2022-06-27 ENCOUNTER — Encounter: Payer: Self-pay | Admitting: Family Medicine

## 2022-07-02 NOTE — Progress Notes (Signed)
Remote pacemaker transmission.   

## 2022-07-07 ENCOUNTER — Other Ambulatory Visit: Payer: Self-pay | Admitting: Family Medicine

## 2022-07-07 DIAGNOSIS — I480 Paroxysmal atrial fibrillation: Secondary | ICD-10-CM

## 2022-07-07 DIAGNOSIS — E785 Hyperlipidemia, unspecified: Secondary | ICD-10-CM

## 2022-07-16 ENCOUNTER — Encounter (HOSPITAL_BASED_OUTPATIENT_CLINIC_OR_DEPARTMENT_OTHER): Payer: Self-pay

## 2022-07-16 ENCOUNTER — Ambulatory Visit (HOSPITAL_BASED_OUTPATIENT_CLINIC_OR_DEPARTMENT_OTHER)
Admission: RE | Admit: 2022-07-16 | Discharge: 2022-07-16 | Disposition: A | Discharge status: Home / Self Care | Payer: Medicare Other | Source: Ambulatory Visit | Attending: Family Medicine | Admitting: Family Medicine

## 2022-07-16 DIAGNOSIS — Z1231 Encounter for screening mammogram for malignant neoplasm of breast: Secondary | ICD-10-CM | POA: Diagnosis present

## 2022-07-17 ENCOUNTER — Encounter: Payer: Self-pay | Admitting: Family Medicine

## 2022-07-17 DIAGNOSIS — N6320 Unspecified lump in the left breast, unspecified quadrant: Secondary | ICD-10-CM

## 2022-07-18 ENCOUNTER — Ambulatory Visit
Admission: RE | Admit: 2022-07-18 | Discharge: 2022-07-18 | Disposition: A | Discharge status: Home / Self Care | Payer: Medicare Other | Source: Ambulatory Visit | Attending: Family Medicine | Admitting: Family Medicine

## 2022-07-18 ENCOUNTER — Other Ambulatory Visit: Payer: Self-pay | Admitting: Family Medicine

## 2022-07-18 DIAGNOSIS — N6320 Unspecified lump in the left breast, unspecified quadrant: Secondary | ICD-10-CM

## 2022-07-18 NOTE — Telephone Encounter (Signed)
[facility] called, they were able to get the pt n today but the e-signature disappeared so they need pcp to sign it again.

## 2022-07-27 ENCOUNTER — Other Ambulatory Visit: Payer: Self-pay | Admitting: Family Medicine

## 2022-07-30 ENCOUNTER — Ambulatory Visit
Admission: RE | Admit: 2022-07-30 | Discharge: 2022-07-30 | Disposition: A | Discharge status: Home / Self Care | Payer: Medicare Other | Source: Ambulatory Visit | Attending: Family Medicine | Admitting: Family Medicine

## 2022-07-30 DIAGNOSIS — N6320 Unspecified lump in the left breast, unspecified quadrant: Secondary | ICD-10-CM

## 2022-07-30 HISTORY — PX: BREAST BIOPSY: SHX20

## 2022-08-01 ENCOUNTER — Telehealth: Payer: Self-pay | Admitting: Family Medicine

## 2022-08-01 ENCOUNTER — Telehealth: Payer: Self-pay | Admitting: Hematology

## 2022-08-01 NOTE — Telephone Encounter (Signed)
Contacted Sheila Schmidt to schedule their annual wellness visit. Patient declined to schedule AWV at this time. Req CB 10/2022  [name] Direct Dial: [phone]

## 2022-08-01 NOTE — Telephone Encounter (Signed)
Spoke to patient to confirm upcoming afternoon [facility] clinic appointment on 2/28, paperwork will be sent via mail.   Gave location and time, also informed patient that the surgeon's office would be calling as well to get information from them similar to the packet that they will be receiving so make sure to do both.  Reminded patient that all providers will be coming to the clinic to see them HERE and if they had any questions to not hesitate to reach back out to myself or their navigators.

## 2022-08-06 ENCOUNTER — Encounter: Payer: Self-pay | Admitting: *Deleted

## 2022-08-06 DIAGNOSIS — Z170 Estrogen receptor positive status [ER+]: Secondary | ICD-10-CM

## 2022-08-07 NOTE — Progress Notes (Signed)
Radiation Oncology         [phone] ________________________________  Multidisciplinary Breast Oncology Clinic [facility]) Initial Outpatient Consultation  Name: Sheila Schmidt MRN: [id number]  Date: 08/08/2022  DOB: 1935/09/05  [id number], Gay Filler, MD  Erroll Luna, MD   REFERRING PHYSICIAN: Erroll Luna, MD  DIAGNOSIS: There were no encounter diagnoses.  Stage *** Left Breast UOQ, Invasive Ductal Carcinoma, ER+ / PR+ / Her2-, Grade 2  No diagnosis found.  HISTORY OF PRESENT ILLNESS::Sheila Schmidt is a 87 y.o. female who is presenting to the office today for evaluation of her newly diagnosed breast cancer. She is accompanied by ***. She is doing well overall.   She had routine screening mammography on 07/16/22 showing a possible abnormality in the left breast. She underwent unilateral left breast diagnostic mammography with tomography and left breast ultrasonography at [facility] on 07/18/22 showing: an irregular hypoechoic mass measuring 0.5 x 0.6 x 0.7 cm in the 3 o'clock left breast, located 3 cmfn. A single abnormal left axillary lymph node was also appreciated measuring 0.8 cm.  Biopsy of the 3 o'clock left breast on 07/30/21 showed: grade 2 invasive ductal carcinoma measuring 0.7 cm in the greatest linear extent of the sample. Biopsy of the left axillary lymph node also showed metastatic carcinoma measuring 0.9 cm in the greatest extent, with evidence of extranodal extension. Prognostic indicators significant for: estrogen receptor, 100% positive and progesterone receptor, 50% positive, both with strong staining intensity. Proliferation marker Ki67 at 20%. HER2 negative.  Menarche: *** years old Age at first live birth: *** years old GP: *** LMP: *** Contraceptive: *** HRT: ***   The patient was referred today for presentation in the multidisciplinary conference.  Radiology studies and pathology slides were presented there for review and discussion of  treatment options.  A consensus was discussed regarding potential next steps.  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   Hx of adenomatous colonic polyps    Hyperlipidemia     PAST SURGICAL HISTORY: Past Surgical History:  Procedure Laterality Date   ATRIAL FLUTTER ABLATION     BIV PACEMAKER INSERTION CRT-P N/A 12/04/2021   Procedure: BIV PACEMAKER INSERTION CRT-P;  Surgeon: Vickie Epley, MD;  Location: [facility];  Service: Cardiovascular;  Laterality: N/A;   BREAST BIOPSY Left 07/30/2022   [place] LT BREAST BX W LOC DEV 1ST LESION IMG BX SPEC US GUIDE 07/30/2022 [facility] MAMMOGRAPHY   BREAST EXCISIONAL BIOPSY Left 2010   benign   CARDIOVERSION  05/2020   in [place] N/A 06/21/2021   Procedure: CARDIOVERSION;  Surgeon: Geralynn Rile, MD;  Location: [facility];  Service: Cardiovascular;  Laterality: N/A;   CARDIOVERSION N/A 03/26/2022   Procedure: CARDIOVERSION;  Surgeon: Lelon Perla, MD;  Location: [facility] ENDOSCOPY;  Service: Cardiovascular;  Laterality: N/A;   CHOLECYSTECTOMY     CHOLECYSTECTOMY, LAPAROSCOPIC  '06   [place] ARTHROSCOPY     Left '00 [place]/ Right '08   lumpectomy- remote     benign   TOOTH EXTRACTION     TOTAL KNEE ARTHROPLASTY  02/05/2011   left    FAMILY HISTORY:  Family History  Problem Relation Age of Onset   Diabetes Mother  Deceased in 61s   Heart disease Mother        cad/MI-fatal   Heart attack Mother    Cancer Father        liver, Deceased in 23s   Heart disease Sister    Breast cancer Neg Hx    Colon cancer Neg Hx     SOCIAL HISTORY:  Social History   Socioeconomic History   Marital status: Widowed    Spouse name: Not on file   Number of children: 3   Years of education: Not  on file   Highest education level: Not on file  Occupational History   Occupation: retired     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 [employer]. Married '59, 3 daughters- '61, '64, '73; 2 grandchildren. Exercise - goes to gym 5/wk. ACP - directed to the [email or web address]   Lives with her daughter - goes between daughters home in [place] and her own home here   Social Determinants of Health   Financial Resource Strain: Low Risk  (02/12/2021)   Overall Financial Resource Strain (CARDIA)    Difficulty of Paying Living Expenses: Not hard at all  Food Insecurity: No Food Insecurity (02/12/2021)   Hunger Vital Sign    Worried About Running Out of Food in the Last Year: Never true    [place] in the Last Year: Never true  Transportation Needs: No Transportation Needs (02/12/2021)   PRAPARE - Hydrologist (Medical): No    Lack of Transportation (Non-Medical): No  Physical Activity: Insufficiently Active (02/12/2021)   Exercise Vital Sign    Days of Exercise per Week: 7 days    Minutes of Exercise per Session: 20 min  Stress: No Stress Concern Present (02/12/2021)   [place]    Feeling of Stress : Not at all  Social Connections: Socially Isolated (02/12/2021)   Social Connection and Isolation Panel [NHANES]    Frequency of Communication with Friends and Family: More than three times a week    Frequency of Social Gatherings with Friends and Family: More than three times a week    Attends Religious Services: Never    Marine scientist or  Organizations: No    Attends Archivist Meetings: Never    Marital Status: Widowed    ALLERGIES: No Known Allergies  MEDICATIONS:  Current Outpatient Medications  Medication Sig Dispense Refill   acetaminophen (TYLENOL) 500 MG tablet Take 1,000 mg by mouth every 8 (eight) hours as needed for moderate pain.     acetaminophen-codeine (TYLENOL #3) 300-30 MG tablet Take 1 tablet by mouth every 6 (six) hours as needed for moderate pain. 30 tablet 0   albuterol (VENTOLIN HFA) 108 (90 Base) MCG/ACT inhaler Inhale 2 puffs into the lungs every 6 (six) hours as needed for wheezing or shortness of breath. 18 g 6   amiodarone (PACERONE) 200 MG tablet Take 1 tablet (200 mg total) by mouth daily. 30 tablet 6   atorvastatin (LIPITOR)  20 MG tablet TAKE 1 TABLET BY MOUTH DAILY 90 tablet 3   Calcium Carbonate-Vit D-Min (CALTRATE 600+D PLUS MINERALS) 600-800 MG-UNIT CHEW Chew 1 each by mouth daily.     cefdinir (OMNICEF) 300 MG capsule Take 1 capsule (300 mg total) by mouth 2 (two) times daily. 20 capsule 0   Cholecalciferol 50 MCG (2000 UT) TABS Take 2,000 Units by mouth daily.     ENTRESTO 24-26 MG TAKE 1 TABLET BY MOUTH TWICE  DAILY 180 tablet 3   famotidine (PEPCID) 40 MG tablet Take 40 mg by mouth daily as needed for heartburn or indigestion.     FLUoxetine (PROZAC) 20 MG capsule TAKE 1 CAPSULE BY MOUTH DAILY 90 capsule 3   melatonin 5 MG TABS Take 5 mg by mouth at bedtime.     vitamin B-12 (CYANOCOBALAMIN) 100 MCG tablet Take 100 mcg by mouth daily.     XARELTO 20 MG TABS tablet TAKE 1 TABLET BY MOUTH DAILY  WITH SUPPER 90 tablet 3   No current facility-administered medications for this encounter.    REVIEW OF SYSTEMS: A 10+ POINT REVIEW OF SYSTEMS WAS OBTAINED including neurology, dermatology, psychiatry, cardiac, respiratory, lymph, extremities, GI, GU, musculoskeletal, constitutional, reproductive, HEENT. On the provided form, she reports ***. She denies *** and any other symptoms.     PHYSICAL EXAM:  vitals were not taken for this visit.  {may need to copy over vitals} Lungs are clear to auscultation bilaterally. Heart has regular rate and rhythm. No palpable cervical, supraclavicular, or axillary adenopathy. Abdomen soft, non-tender, normal bowel sounds. Breast: *** breast with no palpable mass, nipple discharge, or bleeding. *** breast with ***.   KPS = ***  100 - Normal; no complaints; no evidence of disease. 90   - Able to carry on normal activity; minor signs or symptoms of disease. 80   - Normal activity with effort; some signs or symptoms of disease. 81   - Cares for self; unable to carry on normal activity or to do active work. 60   - Requires occasional assistance, but is able to care for most of his personal needs. 50   - Requires considerable assistance and frequent medical care. 59   - Disabled; requires special care and assistance. 64   - Severely disabled; hospital admission is indicated although death not imminent. 57   - Very sick; hospital admission necessary; active supportive treatment necessary. 10   - Moribund; fatal processes progressing rapidly. 0     - Dead  Karnofsky DA, Abelmann [place], Craver LS and Burchenal [facility] [phone]) The use of the nitrogen mustards in the palliative treatment of carcinoma: with particular reference to bronchogenic carcinoma Cancer 1 [id number]  LABORATORY DATA:  Lab Results  Component Value Date   WBC 10.0 03/21/2022   HGB 14.3 03/21/2022   HCT 42.0 03/21/2022   MCV 100 (H) 03/21/2022   PLT 211 03/21/2022   Lab Results  Component Value Date   NA 142 06/18/2022   K 3.6 06/18/2022   CL 105 06/18/2022   CO2 29 06/18/2022   Lab Results  Component Value Date   ALT 13 03/21/2022   AST 17 03/21/2022   ALKPHOS 68 03/21/2022   BILITOT 0.8 03/21/2022    PULMONARY FUNCTION TEST:   Review Flowsheet        No data to display          RADIOGRAPHY: [place] LT BREAST BX W LOC DEV 1ST LESION IMG BX SPEC US GUIDE  Addendum  Date: 07/31/2022   ADDENDUM REPORT: 07/31/2022 13:36 ADDENDUM: Pathology revealed GRADE II INVASIVE DUCTAL CARCINOMA of the LEFT breast, 3 o'clock, (ribbon clip). This was found to be concordant by Dr. Nolon Nations. Pathology revealed METASTATIC CARCINOMA TO A LYMPH NODE of the LEFT axilla, (spiral hydromark clip). This was found to be concordant by Dr. Nolon Nations. Pathology results were discussed with the patient and her daughter, Fernanda Lamm, by telephone. The patient reported doing well after the biopsies with tenderness at the sites. Post biopsy instructions and care were reviewed and questions were answered. The patient was encouraged to call [facility] for any additional concerns. My direct phone number was provided. The patient was referred to [facility] on August 08, 2022. Pathology results reported by Terie Purser, RN on 07/31/2022. Electronically Signed   By: Nolon Nations M.D.   On: 07/31/2022 13:36   Addendum Date: 07/30/2022   ADDENDUM REPORT: 07/30/2022 13:56 ADDENDUM: Corrected report: Site 2: LEFT axilla. HydroMARK spiral clip Using sterile technique and 1% lidocaine as local anesthetic, under direct ultrasound visualization, a 14 gauge spring-loaded device was used to perform biopsy of enlarged LEFT axillary lymph using a LATERAL approach. At the conclusion of the procedureHydroMARK spiral tissue marker clip was deployed into the biopsy cavity. Electronically Signed   By: Nolon Nations M.D.   On: 07/30/2022 13:56   Result Date: 07/31/2022 CLINICAL DATA:  Patient presents for ultrasound-guided core biopsy of LEFT breast mass and enlarged LEFT axillary lymph node. Patient had LEFT lumpectomy in October 2010. Patient is 0.7 centimeter invasive ductal carcinoma and ductal carcinoma in-situ with negative margins. Patient had 2 benign LEFT axillary sentinel lymph nodes. Patient  did not have radiation or chemotherapy. EXAM: ULTRASOUND GUIDED LEFT BREAST CORE NEEDLE BIOPSY ULTRASOUND-GUIDED LEFT AXILLARY CORE NEEDLE BIOPSY COMPARISON:  Previous exam(s). PROCEDURE: I met with the patient and we discussed the procedure of ultrasound-guided biopsy, including benefits and alternatives. We discussed the high likelihood of a successful procedure. We discussed the risks of the procedure, including infection, bleeding, tissue injury, clip migration, and inadequate sampling. Informed written consent was given. The usual time-out protocol was performed immediately prior to the procedure. Site 1: LEFT breast 3 o'clock. Lesion quadrant: LATERAL LEFT breast, ribbon clip Using sterile technique and 1% lidocaine as local anesthetic, under direct ultrasound visualization, a 12 gauge spring-loaded device was used to perform biopsy of mass in the 3 o'clock location of the LEFT breast 3 centimeters from the nipple using a LATERAL approach. At the conclusion of the procedure ribbon tissue marker clip was deployed into the biopsy cavity. Site 2: LEFT axilla.  HydroMARK spiral clip Using sterile technique and 1% lidocaine as local anesthetic, under direct ultrasound visualization, a 14 gauge spring-loaded device was used to perform biopsy of enlarged RIGHT axillary lymph using a LATERAL approach. At the conclusion of the procedure HydroMARK spiral tissue marker clip was deployed into the biopsy cavity. Follow-up 2-view mammogram was performed and dictated separately. IMPRESSION: Ultrasound guided biopsy of mass in the 3 o'clock location of the LEFT breast and enlarged LEFT axillary lymph node. No apparent complications. Electronically Signed: By: Nolon Nations M.D. On: 07/30/2022 08:28  [place] AXILLARY NODE CORE BIOPSY LEFT  Addendum Date: 07/31/2022   ADDENDUM REPORT: 07/31/2022 13:36 ADDENDUM: Pathology revealed GRADE II INVASIVE DUCTAL CARCINOMA of the LEFT breast, 3 o'clock, (ribbon clip). This was found  to be concordant by  Dr. Nolon Nations. Pathology revealed METASTATIC CARCINOMA TO A LYMPH NODE of the LEFT axilla, (spiral hydromark clip). This was found to be concordant by Dr. Nolon Nations. Pathology results were discussed with the patient and her daughter, Mileydi Deger, by telephone. The patient reported doing well after the biopsies with tenderness at the sites. Post biopsy instructions and care were reviewed and questions were answered. The patient was encouraged to call [facility] for any additional concerns. My direct phone number was provided. The patient was referred to [facility] on August 08, 2022. Pathology results reported by Terie Purser, RN on 07/31/2022. Electronically Signed   By: Nolon Nations M.D.   On: 07/31/2022 13:36   Addendum Date: 07/30/2022   ADDENDUM REPORT: 07/30/2022 13:56 ADDENDUM: Corrected report: Site 2: LEFT axilla. HydroMARK spiral clip Using sterile technique and 1% lidocaine as local anesthetic, under direct ultrasound visualization, a 14 gauge spring-loaded device was used to perform biopsy of enlarged LEFT axillary lymph using a LATERAL approach. At the conclusion of the procedureHydroMARK spiral tissue marker clip was deployed into the biopsy cavity. Electronically Signed   By: Nolon Nations M.D.   On: 07/30/2022 13:56   Result Date: 07/31/2022 CLINICAL DATA:  Patient presents for ultrasound-guided core biopsy of LEFT breast mass and enlarged LEFT axillary lymph node. Patient had LEFT lumpectomy in October 2010. Patient is 0.7 centimeter invasive ductal carcinoma and ductal carcinoma in-situ with negative margins. Patient had 2 benign LEFT axillary sentinel lymph nodes. Patient did not have radiation or chemotherapy. EXAM: ULTRASOUND GUIDED LEFT BREAST CORE NEEDLE BIOPSY ULTRASOUND-GUIDED LEFT AXILLARY CORE NEEDLE BIOPSY COMPARISON:  Previous exam(s).  PROCEDURE: I met with the patient and we discussed the procedure of ultrasound-guided biopsy, including benefits and alternatives. We discussed the high likelihood of a successful procedure. We discussed the risks of the procedure, including infection, bleeding, tissue injury, clip migration, and inadequate sampling. Informed written consent was given. The usual time-out protocol was performed immediately prior to the procedure. Site 1: LEFT breast 3 o'clock. Lesion quadrant: LATERAL LEFT breast, ribbon clip Using sterile technique and 1% lidocaine as local anesthetic, under direct ultrasound visualization, a 12 gauge spring-loaded device was used to perform biopsy of mass in the 3 o'clock location of the LEFT breast 3 centimeters from the nipple using a LATERAL approach. At the conclusion of the procedure ribbon tissue marker clip was deployed into the biopsy cavity. Site 2: LEFT axilla.  HydroMARK spiral clip Using sterile technique and 1% lidocaine as local anesthetic, under direct ultrasound visualization, a 14 gauge spring-loaded device was used to perform biopsy of enlarged RIGHT axillary lymph using a LATERAL approach. At the conclusion of the procedure HydroMARK spiral tissue marker clip was deployed into the biopsy cavity. Follow-up 2-view mammogram was performed and dictated separately. IMPRESSION: Ultrasound guided biopsy of mass in the 3 o'clock location of the LEFT breast and enlarged LEFT axillary lymph node. No apparent complications. Electronically Signed: By: Nolon Nations M.D. On: 07/30/2022 08:28  MM CLIP PLACEMENT LEFT  Result Date: 07/30/2022 CLINICAL DATA:  Status post ultrasound-guided core biopsy of LEFT breast mass and LEFT axillary lymph node. EXAM: 3D DIAGNOSTIC LEFT MAMMOGRAM POST ULTRASOUND BIOPSY x2 COMPARISON:  Previous exam(s). FINDINGS: 3D Mammographic images were obtained following ultrasound guided biopsy of LEFT breast mass and placement of a ribbon shaped clip. The biopsy  marking clip is in expected position at the site of biopsy.  Following biopsy of LEFT axillary lymph node, a spiral HydroMARK clip was placed. However, due to the location of the lymph node in the presence of the transvenous pacemaker, the clip is not demonstrated mammographically. IMPRESSION: Ribbon shaped tissue marker clip in the expected location. LEFT axillary clip not demonstrated. Final Assessment: Post Procedure Mammograms for Marker Placement Electronically Signed   By: Nolon Nations M.D.   On: 07/30/2022 09:06  MM DIAG BREAST TOMO UNI LEFT  Result Date: 07/18/2022 CLINICAL DATA:  87 year old female presenting as a recall from screening for possible left breast mass. History of remote left breast cancer status post lumpectomy. EXAM: DIGITAL DIAGNOSTIC UNILATERAL LEFT MAMMOGRAM WITH TOMOSYNTHESIS; ULTRASOUND LEFT BREAST LIMITED TECHNIQUE: Left digital diagnostic mammography and breast tomosynthesis was performed.; Targeted ultrasound examination of the left breast was performed. COMPARISON:  Previous exam(s). ACR Breast Density Category b: There are scattered areas of fibroglandular density. FINDINGS: Mammogram: Left breast: Spot compression tomosynthesis views of the left breast performed demonstrating persistence of a subcentimeter spiculated mass in the outer left breast. The mass measures 0.7 cm. Ultrasound: Targeted ultrasound is performed in the left breast at 3 o'clock 3 cm from the nipple demonstrating an irregular hypoechoic mass with echogenic rim measuring 0.5 x 0.6 x 0.7 cm. This corresponds to the mammographic finding. Targeted ultrasound of the left axilla demonstrates 1 abnormal lymph node with abnormal cortical thickening measuring 0.8 cm. IMPRESSION: 1. Highly suspicious mass in the left breast at 3 o'clock measuring 0.7 cm. 2.  Suspicious single left axillary lymph node. RECOMMENDATION: Ultrasound-guided core needle biopsy of the left breast mass and of a left axillary lymph node. I  have discussed the findings and recommendations with the patient who agrees to proceed with biopsy. The patient will be scheduled for the biopsy appointment prior to leaving the office today. BI-RADS CATEGORY  5: Highly suggestive of malignancy. Electronically Signed   By: Audie Pinto M.D.   On: 07/18/2022 13:37  US BREAST LTD UNI LEFT INC AXILLA  Result Date: 07/18/2022 CLINICAL DATA:  87 year old female presenting as a recall from screening for possible left breast mass. History of remote left breast cancer status post lumpectomy. EXAM: DIGITAL DIAGNOSTIC UNILATERAL LEFT MAMMOGRAM WITH TOMOSYNTHESIS; ULTRASOUND LEFT BREAST LIMITED TECHNIQUE: Left digital diagnostic mammography and breast tomosynthesis was performed.; Targeted ultrasound examination of the left breast was performed. COMPARISON:  Previous exam(s). ACR Breast Density Category b: There are scattered areas of fibroglandular density. FINDINGS: Mammogram: Left breast: Spot compression tomosynthesis views of the left breast performed demonstrating persistence of a subcentimeter spiculated mass in the outer left breast. The mass measures 0.7 cm. Ultrasound: Targeted ultrasound is performed in the left breast at 3 o'clock 3 cm from the nipple demonstrating an irregular hypoechoic mass with echogenic rim measuring 0.5 x 0.6 x 0.7 cm. This corresponds to the mammographic finding. Targeted ultrasound of the left axilla demonstrates 1 abnormal lymph node with abnormal cortical thickening measuring 0.8 cm. IMPRESSION: 1. Highly suspicious mass in the left breast at 3 o'clock measuring 0.7 cm. 2.  Suspicious single left axillary lymph node. RECOMMENDATION: Ultrasound-guided core needle biopsy of the left breast mass and of a left axillary lymph node. I have discussed the findings and recommendations with the patient who agrees to proceed with biopsy. The patient will be scheduled for the biopsy appointment prior to leaving the office today. BI-RADS  CATEGORY  5: Highly suggestive of malignancy. Electronically Signed   By: Audie Pinto M.D.   On: 07/18/2022 13:37  MM 3D SCREEN BREAST BILATERAL  Result Date: 07/17/2022 CLINICAL DATA:  Screening. EXAM: DIGITAL SCREENING BILATERAL MAMMOGRAM WITH TOMOSYNTHESIS AND CAD TECHNIQUE: Bilateral screening digital craniocaudal and mediolateral oblique mammograms were obtained. Bilateral screening digital breast tomosynthesis was performed. The images were evaluated with computer-aided detection. COMPARISON:  Previous exam(s). ACR Breast Density Category a: The breast tissue is almost entirely fatty. FINDINGS: In the left breast, a possible mass warrants further evaluation. In the right breast, no findings suspicious for malignancy. IMPRESSION: Further evaluation is suggested for a possible mass in the left breast. RECOMMENDATION: Diagnostic mammogram and possibly ultrasound of the left breast. (Code:[id number]) The patient will be contacted regarding the findings, and additional imaging will be scheduled. BI-RADS CATEGORY  0: Incomplete. Need additional imaging evaluation and/or prior mammograms for comparison. Electronically Signed   By: Everlean Alstrom M.D.   On: 07/17/2022 15:37      IMPRESSION: *** {DIAGNOSIS HERE}  Patient will be a good candidate for breast conservation with radiotherapy to *** breast. We discussed the general course of radiation, potential side effects, and toxicities with radiation and the patient is interested in this approach. ***   PLAN:  *** (copy notes from board at conference)   ------------------------------------------------  Blair Promise, PhD, MD  This document serves as a record of services personally performed by Gery Pray, MD. It was created on his behalf by Roney Mans, a trained medical scribe. The creation of this record is based on the scribe's personal observations and the provider's statements to them. This document has been checked and approved by the  attending provider.

## 2022-08-07 NOTE — Assessment & Plan Note (Signed)
--  We discussed her imaging findings and the biopsy results in great details. -Given her advanced age, she is not a candidate for chemotherapy.  I did not recommend Oncotype -We recommend upfront surgery with lumpectomy and targeted lymph node dissection. -Giving the strong ER and PR expression in her postmenopausal status, I recommend adjuvant endocrine therapy with aromatase inhibitor for a total of 5-10 years to reduce the risk of cancer recurrence. Potential benefits and side effects were discussed with patient and she is interested. -She was also seen by radiation oncologist Dr. Fonda Kinder today. If her surgical sentinel lymph nodes were negative, she would not need post mastectomy radiation.  -We also discussed the breast cancer surveillance after her surgery. She will continue annual screening mammogram, self exam, and a routine office visit with lab and exam with [place]. -I encouraged her to have healthy diet and exercise regularly.

## 2022-08-08 ENCOUNTER — Ambulatory Visit
Admission: RE | Admit: 2022-08-08 | Discharge: 2022-08-08 | Disposition: A | Discharge status: Home / Self Care | Payer: Medicare Other | Source: Ambulatory Visit | Attending: Radiation Oncology | Admitting: Radiation Oncology

## 2022-08-08 ENCOUNTER — Inpatient Hospital Stay: Payer: Medicare Other

## 2022-08-08 ENCOUNTER — Inpatient Hospital Stay (HOSPITAL_BASED_OUTPATIENT_CLINIC_OR_DEPARTMENT_OTHER): Payer: Medicare Other | Admitting: Genetic Counselor

## 2022-08-08 ENCOUNTER — Ambulatory Visit: Payer: Medicare Other | Attending: Surgery | Admitting: Physical Therapy

## 2022-08-08 ENCOUNTER — Encounter: Payer: Self-pay | Admitting: Emergency Medicine

## 2022-08-08 ENCOUNTER — Ambulatory Visit: Payer: Self-pay | Admitting: Surgery

## 2022-08-08 ENCOUNTER — Encounter: Payer: Self-pay | Admitting: Hematology

## 2022-08-08 ENCOUNTER — Inpatient Hospital Stay: Payer: Medicare Other | Attending: Hematology | Admitting: Hematology

## 2022-08-08 ENCOUNTER — Other Ambulatory Visit: Payer: Self-pay

## 2022-08-08 VITALS — BP 154/77 | HR 60 | Temp 98.1°F | Resp 16 | Ht 62.0 in | Wt 178.8 lb

## 2022-08-08 DIAGNOSIS — R5383 Other fatigue: Secondary | ICD-10-CM | POA: Insufficient documentation

## 2022-08-08 DIAGNOSIS — Z950 Presence of cardiac pacemaker: Secondary | ICD-10-CM | POA: Insufficient documentation

## 2022-08-08 DIAGNOSIS — Z170 Estrogen receptor positive status [ER+]: Secondary | ICD-10-CM

## 2022-08-08 DIAGNOSIS — Z800 Family history of malignant neoplasm of digestive organs: Secondary | ICD-10-CM | POA: Insufficient documentation

## 2022-08-08 DIAGNOSIS — C50412 Malignant neoplasm of upper-outer quadrant of left female breast: Secondary | ICD-10-CM

## 2022-08-08 DIAGNOSIS — R293 Abnormal posture: Secondary | ICD-10-CM | POA: Insufficient documentation

## 2022-08-08 DIAGNOSIS — Z96652 Presence of left artificial knee joint: Secondary | ICD-10-CM | POA: Diagnosis not present

## 2022-08-08 DIAGNOSIS — G8929 Other chronic pain: Secondary | ICD-10-CM | POA: Insufficient documentation

## 2022-08-08 DIAGNOSIS — C50512 Malignant neoplasm of lower-outer quadrant of left female breast: Secondary | ICD-10-CM | POA: Diagnosis present

## 2022-08-08 DIAGNOSIS — Z780 Asymptomatic menopausal state: Secondary | ICD-10-CM | POA: Insufficient documentation

## 2022-08-08 DIAGNOSIS — M25561 Pain in right knee: Secondary | ICD-10-CM | POA: Diagnosis present

## 2022-08-08 DIAGNOSIS — C50912 Malignant neoplasm of unspecified site of left female breast: Secondary | ICD-10-CM

## 2022-08-08 LAB — CBC WITH DIFFERENTIAL (CANCER CENTER ONLY)
Abs Immature Granulocytes: 0.02 10*3/uL (ref 0.00–0.07)
Basophils Absolute: 0.1 10*3/uL (ref 0.0–0.1)
Basophils Relative: 1 %
Eosinophils Absolute: 0.1 10*3/uL (ref 0.0–0.5)
Eosinophils Relative: 1 %
HCT: 42.3 % (ref 36.0–46.0)
Hemoglobin: 14.2 g/dL (ref 12.0–15.0)
Immature Granulocytes: 0 %
Lymphocytes Relative: 35 %
Lymphs Abs: 3.6 10*3/uL (ref 0.7–4.0)
MCH: 34.1 pg — ABNORMAL HIGH (ref 26.0–34.0)
MCHC: 33.6 g/dL (ref 30.0–36.0)
MCV: 101.7 fL — ABNORMAL HIGH (ref 80.0–100.0)
Monocytes Absolute: 0.8 10*3/uL (ref 0.1–1.0)
Monocytes Relative: 8 %
Neutro Abs: 5.6 10*3/uL (ref 1.7–7.7)
Neutrophils Relative %: 55 %
Platelet Count: 233 10*3/uL (ref 150–400)
RBC: 4.16 MIL/uL (ref 3.87–5.11)
RDW: 13.2 % (ref 11.5–15.5)
WBC Count: 10.3 10*3/uL (ref 4.0–10.5)
nRBC: 0 % (ref 0.0–0.2)

## 2022-08-08 LAB — CMP (CANCER CENTER ONLY)
ALT: 17 U/L (ref 0–44)
AST: 18 U/L (ref 15–41)
Albumin: 3.9 g/dL (ref 3.5–5.0)
Alkaline Phosphatase: 56 U/L (ref 38–126)
Anion gap: 5 (ref 5–15)
BUN: 16 mg/dL (ref 8–23)
CO2: 29 mmol/L (ref 22–32)
Calcium: 9.2 mg/dL (ref 8.9–10.3)
Chloride: 106 mmol/L (ref 98–111)
Creatinine: 0.97 mg/dL (ref 0.44–1.00)
GFR, Estimated: 58 mL/min — ABNORMAL LOW (ref >60)
Glucose, Bld: 76 mg/dL (ref 70–99)
Potassium: 4 mmol/L (ref 3.5–5.1)
Sodium: 140 mmol/L (ref 135–145)
Total Bilirubin: 1.1 mg/dL (ref 0.3–1.2)
Total Protein: 6.4 g/dL — ABNORMAL LOW (ref 6.5–8.1)

## 2022-08-08 LAB — GENETIC SCREENING ORDER

## 2022-08-08 NOTE — Research (Signed)
[employer] 2021-05 - Specimen Collection Study to Evaluate Biomarkers in Subjects with Cancer   INTRO TO STUDY/CONSENT  Patient Sheila Schmidt was identified by Clinical Research as a potential candidate for the above listed study.  This Clinical Research Nurse met with Sheila Schmidt, [id number], on 08/08/22 in a manner and location that ensures patient privacy to discuss participation in the above listed research study.  Patient is Accompanied by her daughter .  A copy of the informed consent document with embedded HIPAA language was provided to the patient.  Patient reads, speaks, and understands [place].   Patient was provided with the business card of this Nurse and encouraged to contact the research team with any questions.  Approximately 15 minutes were spent with the patient reviewing the informed consent documents.  Patient was provided the option of taking informed consent documents home to review and was encouraged to review at their convenience with their support network, including other care providers. Patient took the consent documents home to review.  Will f/u with patient next week to determine interest.  Wells Guiles 'Learta CoddingNeysa Bonito, RN, BSN, [place] Nurse I 08/08/22 3:14 PM

## 2022-08-08 NOTE — Progress Notes (Signed)
[place]   [phone] Fax:([phone]) [place] Note   Patient Care Team: Copland, Gay Filler, MD as PCP - General (Family Medicine) Vickie Epley, MD as PCP - Electrophysiology (Cardiology) Garald Balding, MD (Inactive) (Orthopedic Surgery) Vania Rea, MD (Obstetrics and Gynecology) Erroll Luna, MD (General Surgery) Deirdre Pippins, PA-C as Physician Assistant (Dermatology) Erroll Luna, MD as Consulting Physician (General Surgery) Truitt Merle, MD as Consulting Physician (Hematology) Gery Pray, MD as Consulting Physician (Radiation Oncology) Rockwell Germany, RN as Oncology Nurse Navigator Mauro Kaufmann, RN as Oncology Nurse Navigator 08/08/2022  CHIEF COMPLAINTS/PURPOSE OF CONSULTATION:  Newly diagnosed left breast cancer   HISTORY OF PRESENTING ILLNESS:  Berenize Bartha Ozer 87 y.o. female with past medical history of left breast cancer in 2010, is here because of her newly diagnosed left breast cancer.  She is accompanied by her daughter to the clinic today.  She had a stage I left breast cancer diagnosed in 2014, status post lumpectomy, and 5 years of tamoxifen.  She has been doing annual screening mammograms since then.  She denies any palpable mass before the most recent mammogram.  Due to the abnormal screening mammogram in February 2024, she underwent left diagnostic mammogram and ultrasound on July 18, 2022, which showed highly suspicious 0.7 cm mass in the left breast 3 o'clock position, with suspicious single left axillary lymph node.  She underwent a biopsy of the breast mass and axillary lymph node on July 30, 2022, both showed invasive adenocarcinoma, ER and PR positive, HER2 negative, with Ki-67 20%.  She lives with her oldest daughter, she is independent in ADLs, she does have a walker and cane but does not use often.  She still goes out for shopping, and drives occasionally.  She had left knee  replacement years ago, and now has significant right knee pain, but does not plan to have surgery.  She also has a pacemaker on left chest wall, for atrial fibrillation.  She overall feels well, denies any other pain, or other new symptoms.  She does report a mild fatigue over the past year, appetite is adequate, no weight loss.  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   Hx of adenomatous colonic polyps    Hyperlipidemia     SURGICAL HISTORY: Past Surgical History:  Procedure Laterality Date   ATRIAL FLUTTER ABLATION     BIV PACEMAKER INSERTION CRT-P N/A 12/04/2021   Procedure: BIV PACEMAKER INSERTION CRT-P;  Surgeon: Vickie Epley, MD;  Location: [facility];  Service: Cardiovascular;  Laterality: N/A;   BREAST BIOPSY Left 07/30/2022   [place] LT BREAST BX W LOC DEV 1ST LESION IMG BX SPEC US GUIDE 07/30/2022 [facility] MAMMOGRAPHY   BREAST EXCISIONAL BIOPSY Left 2010   benign   CARDIOVERSION  05/2020   in [place] N/A 06/21/2021   Procedure: CARDIOVERSION;  Surgeon: Geralynn Rile, MD;  Location: [facility];  Service: Cardiovascular;  Laterality: N/A;   CARDIOVERSION N/A 03/26/2022   Procedure: CARDIOVERSION;  Surgeon: Lelon Perla, MD;  Location: [facility];  Service: Cardiovascular;  Laterality: N/A;   CHOLECYSTECTOMY     CHOLECYSTECTOMY, LAPAROSCOPIC  '  [place] ARTHROSCOPY     Left '00 [place]/ Right '08   lumpectomy- remote     benign   TOOTH EXTRACTION     TOTAL KNEE ARTHROPLASTY  02/05/2011   left    SOCIAL HISTORY: Social History   Socioeconomic History   Marital status: Widowed    Spouse name: Not on file   Number of children: 3   Years of education: Not on file   Highest education level: Not  on file  Occupational History   Occupation: retired     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 [employer]. Married '59, 3 daughters- '61, '64, '73; 2 grandchildren. Exercise - goes to gym 5/wk. ACP - directed to the [email or web address]   Lives with her daughter - goes between daughters home in [place] and her own home here   Social Determinants of Health   Financial Resource Strain: Low Risk  (02/12/2021)   Overall Financial Resource Strain (CARDIA)    Difficulty of Paying Living Expenses: Not hard at all  Food Insecurity: No Food Insecurity (02/12/2021)   Hunger Vital Sign    Worried About Running Out of Food in the Last Year: Never true    [place] in the Last Year: Never true  Transportation Needs: No Transportation Needs (02/12/2021)   PRAPARE - Hydrologist (Medical): No    Lack of Transportation (Non-Medical): No  Physical Activity: Insufficiently Active (02/12/2021)   Exercise Vital Sign    Days of Exercise per Week: 7 days    Minutes of Exercise per Session: 20 min  Stress: No Stress Concern Present (02/12/2021)   [place]    Feeling of Stress : Not at all  Social Connections: Socially Isolated (02/12/2021)   Social Connection and Isolation Panel [NHANES]    Frequency of Communication with Friends and Family: More than three times a week    Frequency of Social Gatherings with Friends and Family: More than three times a week    Attends Religious Services: Never    Marine scientist or Organizations: No    Attends Theatre manager Meetings: Never    Marital Status: Widowed  Intimate Partner Violence: Not At Risk (02/12/2021)   Humiliation, Afraid, Rape, and Kick questionnaire    Fear of Current or Ex-Partner: No    Emotionally Abused: No    Physically Abused: No    Sexually Abused: No    FAMILY HISTORY: Family History  Problem Relation Age of Onset   Diabetes Mother        Deceased in 72s   Heart disease Mother        cad/MI-fatal   Heart attack Mother    Cancer Father        liver, Deceased in 40s   Heart disease Sister    Breast cancer Neg Hx    Colon cancer Neg Hx     ALLERGIES:  has No Known Allergies.  MEDICATIONS:  Current Outpatient Medications  Medication Sig Dispense Refill   acetaminophen (TYLENOL) 500 MG tablet Take 1,000 mg by mouth every 8 (eight) hours as needed for moderate pain.     acetaminophen-codeine (TYLENOL #3) 300-30 MG tablet Take 1 tablet by mouth every 6 (six) hours as needed for moderate pain. 30 tablet 0   albuterol (VENTOLIN HFA) 108 (90 Base) MCG/ACT inhaler Inhale 2 puffs into the lungs every 6 (six) hours as needed for wheezing or shortness of breath. 18 g 6   amiodarone (PACERONE) 200 MG tablet Take 1 tablet (200 mg total) by mouth daily. 30 tablet 6   atorvastatin (LIPITOR) 20 MG tablet TAKE 1 TABLET BY MOUTH DAILY 90 tablet 3   Calcium Carbonate-Vit D-Min (CALTRATE 600+D PLUS MINERALS) 600-800 MG-UNIT CHEW Chew 1 each by mouth daily.     cefdinir (OMNICEF) 300 MG capsule Take 1 capsule (300 mg total) by mouth 2 (two) times daily. 20 capsule 0   Cholecalciferol 50 MCG (2000 UT) TABS Take 2,000 Units by mouth daily.     ENTRESTO 24-26 MG TAKE 1 TABLET BY MOUTH TWICE  DAILY 180 tablet 3   famotidine (PEPCID) 40 MG tablet Take 40 mg by mouth daily as needed for heartburn or indigestion.     FLUoxetine (PROZAC) 20 MG capsule TAKE 1 CAPSULE BY MOUTH DAILY 90 capsule 3   melatonin 5 MG TABS Take 5 mg by mouth at bedtime.     vitamin B-12 (CYANOCOBALAMIN) 100 MCG  tablet Take 100 mcg by mouth daily.     XARELTO 20 MG TABS tablet TAKE 1 TABLET BY MOUTH DAILY  WITH SUPPER 90 tablet 3   No current facility-administered medications for this visit.    REVIEW OF SYSTEMS:   Constitutional: Denies fevers, chills or abnormal night sweats Eyes: Denies blurriness of vision, double vision or watery eyes Ears, nose, mouth, throat, and face: Denies mucositis or sore throat Respiratory: Denies cough, dyspnea or wheezes Cardiovascular: Denies palpitation, chest discomfort or lower extremity swelling Gastrointestinal:  Denies nausea, heartburn or change in bowel habits Skin: Denies abnormal skin rashes Lymphatics: Denies new lymphadenopathy or easy bruising Neurological:Denies numbness, tingling or new weaknesses Behavioral/Psych: Mood is stable, no new changes  All other systems were reviewed with the patient and are negative.  PHYSICAL EXAMINATION: ECOG PERFORMANCE STATUS: 1 - Symptomatic but completely ambulatory  Vitals:   08/08/22 1256  BP: (!) 154/77  Pulse: 60  Resp: 16  Temp: 98.1 F (36.7 C)  SpO2: 97%   Filed Weights   08/08/22 1256  Weight: 178 lb 12.8 oz (81.1 kg)    GENERAL:alert, no distress and comfortable SKIN: skin color, texture, turgor are normal, no rashes or significant lesions EYES: normal, conjunctiva are pink and non-injected, sclera clear OROPHARYNX:no exudate, no erythema and lips, buccal mucosa, and tongue normal  NECK: supple, thyroid normal size, non-tender, without nodularity LYMPH:  no palpable lymphadenopathy in the cervical, axillary or inguinal LUNGS: clear to auscultation and percussion with normal breathing effort HEART: regular rate & rhythm and no murmurs and no lower extremity edema ABDOMEN:abdomen soft, non-tender and normal bowel sounds Musculoskeletal:no cyanosis of digits and no clubbing  PSYCH: alert & oriented x 3 with fluent speech NEURO: no focal motor/sensory deficits Breasts: Breast inspection  showed them to be symmetrical with no nipple discharge.  There is extensive ecchymosis at the biopsy site of the left breast.  Palpation of the breasts and axilla revealed no obvious mass that I could appreciate.  LABORATORY DATA:  I have reviewed the data as listed    Latest Ref Rng & Units 08/08/2022   12:16 PM 03/21/2022    3:02 PM 11/20/2021   11:52 AM  CBC  WBC 4.0 - 10.5 K/uL 10.3  10.0  8.5   Hemoglobin 12.0 - 15.0 g/dL 14.2  14.3  13.9   Hematocrit 36.0 - 46.0 % 42.3  42.0  41.2   Platelets 150 - 400 K/uL 233  211  190.0     '@cmpl'$ @  RADIOGRAPHIC STUDIES: I have personally reviewed the radiological images as listed and agreed with the findings in the report. [place] LT BREAST BX W LOC DEV 1ST LESION IMG BX SPEC US GUIDE  Addendum Date: 07/31/2022   ADDENDUM REPORT: 07/31/2022 13:36 ADDENDUM: Pathology revealed GRADE II INVASIVE DUCTAL CARCINOMA of the LEFT breast, 3 o'clock, (ribbon clip). This was found to be concordant by Dr. Nolon Nations. Pathology revealed METASTATIC CARCINOMA TO A LYMPH NODE of the LEFT axilla, (spiral hydromark clip). This was found to be concordant by Dr. Nolon Nations. Pathology results were discussed with the patient and her daughter, Jasiah Houy, by telephone. The patient reported doing well after the biopsies with tenderness at the sites. Post biopsy instructions and care were reviewed and questions were answered. The patient was encouraged to call [facility] for any additional concerns. My direct phone number was provided. The patient was referred to [facility] on August 08, 2022. Pathology results reported by Terie Purser, RN on 07/31/2022. Electronically Signed   By: Nolon Nations M.D.   On: 07/31/2022 13:36   Addendum Date: 07/30/2022   ADDENDUM REPORT: 07/30/2022 13:56 ADDENDUM: Corrected report: Site 2: LEFT axilla. HydroMARK spiral clip  Using sterile technique and 1% lidocaine as local anesthetic, under direct ultrasound visualization, a 14 gauge spring-loaded device was used to perform biopsy of enlarged LEFT axillary lymph using a LATERAL approach. At the conclusion of the procedureHydroMARK spiral tissue marker clip was deployed into the biopsy cavity. Electronically Signed   By: Nolon Nations M.D.   On: 07/30/2022 13:56   Result Date: 07/31/2022 CLINICAL DATA:  Patient presents for ultrasound-guided core biopsy of LEFT breast mass and enlarged LEFT axillary lymph node. Patient had LEFT lumpectomy in October 2010. Patient is 0.7 centimeter invasive ductal carcinoma and ductal carcinoma in-situ with negative margins. Patient had 2 benign LEFT axillary sentinel lymph nodes. Patient did not have radiation or chemotherapy. EXAM: ULTRASOUND GUIDED LEFT BREAST CORE NEEDLE BIOPSY ULTRASOUND-GUIDED LEFT AXILLARY CORE NEEDLE BIOPSY COMPARISON:  Previous exam(s). PROCEDURE: I met with the patient and we discussed the procedure of ultrasound-guided biopsy, including benefits and alternatives. We discussed the high likelihood of a successful procedure. We discussed the risks of the procedure, including infection, bleeding, tissue injury, clip migration, and inadequate sampling. Informed written consent was given. The usual time-out protocol was performed immediately prior to the procedure. Site 1: LEFT breast 3 o'clock. Lesion quadrant: LATERAL LEFT breast, ribbon clip Using sterile technique and 1% lidocaine as local anesthetic, under direct ultrasound visualization, a 12 gauge spring-loaded device was used to perform biopsy of mass in the 3 o'clock location of the LEFT breast 3 centimeters from the nipple using a LATERAL approach. At the conclusion of the procedure ribbon tissue marker clip was deployed into the biopsy cavity. Site 2: LEFT axilla.  HydroMARK spiral clip Using sterile technique and 1% lidocaine as local anesthetic, under  direct  ultrasound visualization, a 14 gauge spring-loaded device was used to perform biopsy of enlarged RIGHT axillary lymph using a LATERAL approach. At the conclusion of the procedure HydroMARK spiral tissue marker clip was deployed into the biopsy cavity. Follow-up 2-view mammogram was performed and dictated separately. IMPRESSION: Ultrasound guided biopsy of mass in the 3 o'clock location of the LEFT breast and enlarged LEFT axillary lymph node. No apparent complications. Electronically Signed: By: Nolon Nations M.D. On: 07/30/2022 08:28  [place] AXILLARY NODE CORE BIOPSY LEFT  Addendum Date: 07/31/2022   ADDENDUM REPORT: 07/31/2022 13:36 ADDENDUM: Pathology revealed GRADE II INVASIVE DUCTAL CARCINOMA of the LEFT breast, 3 o'clock, (ribbon clip). This was found to be concordant by Dr. Nolon Nations. Pathology revealed METASTATIC CARCINOMA TO A LYMPH NODE of the LEFT axilla, (spiral hydromark clip). This was found to be concordant by Dr. Nolon Nations. Pathology results were discussed with the patient and her daughter, Tanvi Issac, by telephone. The patient reported doing well after the biopsies with tenderness at the sites. Post biopsy instructions and care were reviewed and questions were answered. The patient was encouraged to call [facility] for any additional concerns. My direct phone number was provided. The patient was referred to [facility] on August 08, 2022. Pathology results reported by Terie Purser, RN on 07/31/2022. Electronically Signed   By: Nolon Nations M.D.   On: 07/31/2022 13:36   Addendum Date: 07/30/2022   ADDENDUM REPORT: 07/30/2022 13:56 ADDENDUM: Corrected report: Site 2: LEFT axilla. HydroMARK spiral clip Using sterile technique and 1% lidocaine as local anesthetic, under direct ultrasound visualization, a 14 gauge spring-loaded device was used to perform biopsy of enlarged  LEFT axillary lymph using a LATERAL approach. At the conclusion of the procedureHydroMARK spiral tissue marker clip was deployed into the biopsy cavity. Electronically Signed   By: Nolon Nations M.D.   On: 07/30/2022 13:56   Result Date: 07/31/2022 CLINICAL DATA:  Patient presents for ultrasound-guided core biopsy of LEFT breast mass and enlarged LEFT axillary lymph node. Patient had LEFT lumpectomy in October 2010. Patient is 0.7 centimeter invasive ductal carcinoma and ductal carcinoma in-situ with negative margins. Patient had 2 benign LEFT axillary sentinel lymph nodes. Patient did not have radiation or chemotherapy. EXAM: ULTRASOUND GUIDED LEFT BREAST CORE NEEDLE BIOPSY ULTRASOUND-GUIDED LEFT AXILLARY CORE NEEDLE BIOPSY COMPARISON:  Previous exam(s). PROCEDURE: I met with the patient and we discussed the procedure of ultrasound-guided biopsy, including benefits and alternatives. We discussed the high likelihood of a successful procedure. We discussed the risks of the procedure, including infection, bleeding, tissue injury, clip migration, and inadequate sampling. Informed written consent was given. The usual time-out protocol was performed immediately prior to the procedure. Site 1: LEFT breast 3 o'clock. Lesion quadrant: LATERAL LEFT breast, ribbon clip Using sterile technique and 1% lidocaine as local anesthetic, under direct ultrasound visualization, a 12 gauge spring-loaded device was used to perform biopsy of mass in the 3 o'clock location of the LEFT breast 3 centimeters from the nipple using a LATERAL approach. At the conclusion of the procedure ribbon tissue marker clip was deployed into the biopsy cavity. Site 2: LEFT axilla.  HydroMARK spiral clip Using sterile technique and 1% lidocaine as local anesthetic, under direct ultrasound visualization, a 14 gauge spring-loaded device was used to perform biopsy of enlarged RIGHT axillary lymph using a LATERAL approach. At the conclusion of the procedure  [facility] spiral tissue marker  clip was deployed into the biopsy cavity. Follow-up 2-view mammogram was performed and dictated separately. IMPRESSION: Ultrasound guided biopsy of mass in the 3 o'clock location of the LEFT breast and enlarged LEFT axillary lymph node. No apparent complications. Electronically Signed: By: Nolon Nations M.D. On: 07/30/2022 08:28  MM CLIP PLACEMENT LEFT  Result Date: 07/30/2022 CLINICAL DATA:  Status post ultrasound-guided core biopsy of LEFT breast mass and LEFT axillary lymph node. EXAM: 3D DIAGNOSTIC LEFT MAMMOGRAM POST ULTRASOUND BIOPSY x2 COMPARISON:  Previous exam(s). FINDINGS: 3D Mammographic images were obtained following ultrasound guided biopsy of LEFT breast mass and placement of a ribbon shaped clip. The biopsy marking clip is in expected position at the site of biopsy. Following biopsy of LEFT axillary lymph node, a spiral HydroMARK clip was placed. However, due to the location of the lymph node in the presence of the transvenous pacemaker, the clip is not demonstrated mammographically. IMPRESSION: Ribbon shaped tissue marker clip in the expected location. LEFT axillary clip not demonstrated. Final Assessment: Post Procedure Mammograms for Marker Placement Electronically Signed   By: Nolon Nations M.D.   On: 07/30/2022 09:06  MM DIAG BREAST TOMO UNI LEFT  Result Date: 07/18/2022 CLINICAL DATA:  87 year old female presenting as a recall from screening for possible left breast mass. History of remote left breast cancer status post lumpectomy. EXAM: DIGITAL DIAGNOSTIC UNILATERAL LEFT MAMMOGRAM WITH TOMOSYNTHESIS; ULTRASOUND LEFT BREAST LIMITED TECHNIQUE: Left digital diagnostic mammography and breast tomosynthesis was performed.; Targeted ultrasound examination of the left breast was performed. COMPARISON:  Previous exam(s). ACR Breast Density Category b: There are scattered areas of fibroglandular density. FINDINGS: Mammogram: Left breast: Spot compression  tomosynthesis views of the left breast performed demonstrating persistence of a subcentimeter spiculated mass in the outer left breast. The mass measures 0.7 cm. Ultrasound: Targeted ultrasound is performed in the left breast at 3 o'clock 3 cm from the nipple demonstrating an irregular hypoechoic mass with echogenic rim measuring 0.5 x 0.6 x 0.7 cm. This corresponds to the mammographic finding. Targeted ultrasound of the left axilla demonstrates 1 abnormal lymph node with abnormal cortical thickening measuring 0.8 cm. IMPRESSION: 1. Highly suspicious mass in the left breast at 3 o'clock measuring 0.7 cm. 2.  Suspicious single left axillary lymph node. RECOMMENDATION: Ultrasound-guided core needle biopsy of the left breast mass and of a left axillary lymph node. I have discussed the findings and recommendations with the patient who agrees to proceed with biopsy. The patient will be scheduled for the biopsy appointment prior to leaving the office today. BI-RADS CATEGORY  5: Highly suggestive of malignancy. Electronically Signed   By: Audie Pinto M.D.   On: 07/18/2022 13:37  US BREAST LTD UNI LEFT INC AXILLA  Result Date: 07/18/2022 CLINICAL DATA:  87 year old female presenting as a recall from screening for possible left breast mass. History of remote left breast cancer status post lumpectomy. EXAM: DIGITAL DIAGNOSTIC UNILATERAL LEFT MAMMOGRAM WITH TOMOSYNTHESIS; ULTRASOUND LEFT BREAST LIMITED TECHNIQUE: Left digital diagnostic mammography and breast tomosynthesis was performed.; Targeted ultrasound examination of the left breast was performed. COMPARISON:  Previous exam(s). ACR Breast Density Category b: There are scattered areas of fibroglandular density. FINDINGS: Mammogram: Left breast: Spot compression tomosynthesis views of the left breast performed demonstrating persistence of a subcentimeter spiculated mass in the outer left breast. The mass measures 0.7 cm. Ultrasound: Targeted ultrasound is  performed in the left breast at 3 o'clock 3 cm from the nipple demonstrating an irregular hypoechoic mass with echogenic rim measuring 0.5 x  0.6 x 0.7 cm. This corresponds to the mammographic finding. Targeted ultrasound of the left axilla demonstrates 1 abnormal lymph node with abnormal cortical thickening measuring 0.8 cm. IMPRESSION: 1. Highly suspicious mass in the left breast at 3 o'clock measuring 0.7 cm. 2.  Suspicious single left axillary lymph node. RECOMMENDATION: Ultrasound-guided core needle biopsy of the left breast mass and of a left axillary lymph node. I have discussed the findings and recommendations with the patient who agrees to proceed with biopsy. The patient will be scheduled for the biopsy appointment prior to leaving the office today. BI-RADS CATEGORY  5: Highly suggestive of malignancy. Electronically Signed   By: Audie Pinto M.D.   On: 07/18/2022 13:37  MM 3D SCREEN BREAST BILATERAL  Result Date: 07/17/2022 CLINICAL DATA:  Screening. EXAM: DIGITAL SCREENING BILATERAL MAMMOGRAM WITH TOMOSYNTHESIS AND CAD TECHNIQUE: Bilateral screening digital craniocaudal and mediolateral oblique mammograms were obtained. Bilateral screening digital breast tomosynthesis was performed. The images were evaluated with computer-aided detection. COMPARISON:  Previous exam(s). ACR Breast Density Category a: The breast tissue is almost entirely fatty. FINDINGS: In the left breast, a possible mass warrants further evaluation. In the right breast, no findings suspicious for malignancy. IMPRESSION: Further evaluation is suggested for a possible mass in the left breast. RECOMMENDATION: Diagnostic mammogram and possibly ultrasound of the left breast. (Code:[id number]) The patient will be contacted regarding the findings, and additional imaging will be scheduled. BI-RADS CATEGORY  0: Incomplete. Need additional imaging evaluation and/or prior mammograms for comparison. Electronically Signed   By: Everlean Alstrom  M.D.   On: 07/17/2022 15:37    ASSESSMENT & PLAN: 87 yo postmenopausal woman  Malignant neoplasm of upper-outer quadrant of left breast in female, estrogen receptor positive ([place]) --We discussed her imaging findings and the biopsy results in great details. -Given her advanced age, she is not a candidate for chemotherapy.  I did not recommend Oncotype -We recommend upfront surgery with lumpectomy and targeted lymph node dissection. She is agreeable  -Giving the strong ER and PR expression in her postmenopausal status, I recommend adjuvant endocrine therapy with aromatase inhibitor for a total of 5 years to reduce the risk of cancer recurrence. Potential benefits and side effects were discussed with patient and she is interested. -She was also seen by radiation oncologist Dr. Sondra Come today.  Due to her positive lymph nodes, she would benefit from adjuvant radiation. -We also discussed the breast cancer surveillance after her surgery. She will continue annual screening mammogram, self exam, and a routine office visit with lab and exam with [place]. -I encouraged her to have healthy diet and exercise regularly.   Plan -She will proceed with left lumpectomy and targeted lymph node dissection by Dr. Brantley Stage in near future -Adjuvant radiation -I will see her towards the end of her radiation, to finalize her antiestrogen therapy.   All questions were answered. The patient knows to call the clinic with any problems, questions or concerns. I spent 40 minutes counseling the patient face to face. The total time spent in the appointment was 50 minutes and more than 50% was on counseling.     Truitt Merle, MD 08/08/2022 2:04 PM

## 2022-08-09 ENCOUNTER — Encounter: Payer: Self-pay | Admitting: Physical Therapy

## 2022-08-09 NOTE — Therapy (Signed)
OUTPATIENT PHYSICAL THERAPY BREAST CANCER BASELINE EVALUATION   Patient Name: Sheila Schmidt MRN: [id number] DOB:06/25/1935, 87 y.o., female Today's Date: 08/09/2022  END OF SESSION:  PT End of Session - 08/09/22 0838     Visit Number 1    Number of Visits 2    Date for PT Re-Evaluation 10/03/22    PT Start Time 1330    PT Stop Time [id number]   Also saw pt from 1433-1446 for a total of 41 min   PT Time Calculation (min) 28 min    Activity Tolerance Patient tolerated treatment well    Behavior During Therapy [facility] for tasks assessed/performed             Past Medical History:  Diagnosis Date   Cancer ([place])     Left breast carcinoma in situ   Cholelithiasis    Depression    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   Hx of adenomatous colonic polyps    Hyperlipidemia    Past Surgical History:  Procedure Laterality Date   ATRIAL FLUTTER ABLATION     BIV PACEMAKER INSERTION CRT-P N/A 12/04/2021   Procedure: BIV PACEMAKER INSERTION CRT-P;  Surgeon: Vickie Epley, MD;  Location: [place] [facility];  Service: Cardiovascular;  Laterality: N/A;   BREAST BIOPSY Left 07/30/2022   [place] LT BREAST BX W LOC DEV 1ST LESION IMG BX SPEC US GUIDE 07/30/2022 [facility] MAMMOGRAPHY   BREAST EXCISIONAL BIOPSY Left 2010   benign   CARDIOVERSION  05/2020   in [place] N/A 06/21/2021   Procedure: CARDIOVERSION;  Surgeon: Geralynn Rile, MD;  Location: [place];  Service: Cardiovascular;  Laterality: N/A;   CARDIOVERSION N/A 03/26/2022   Procedure: CARDIOVERSION;  Surgeon: Lelon Perla, MD;  Location: [facility] ENDOSCOPY;  Service: Cardiovascular;  Laterality: N/A;   CHOLECYSTECTOMY     CHOLECYSTECTOMY, LAPAROSCOPIC  '06   [place] ARTHROSCOPY     Left '00 [place]/ Right '08   lumpectomy- remote     benign   TOOTH EXTRACTION     TOTAL KNEE ARTHROPLASTY  02/05/2011   left   Patient Active Problem List   Diagnosis Date Noted   Malignant neoplasm of  upper-outer quadrant of left breast in female, estrogen receptor positive ([place]) 08/06/2022   Pure hypercholesterolemia 05/15/2022   Skin cancer 03/07/2022   Closed nondisplaced fracture of distal phalanx of right thumb 02/15/2022   Atrial fibrillation ([place]) 12/04/2021   Cancer ([place]) 05/25/2021   Left bundle branch block 04/12/2021   Dilated cardiomyopathy ([place]) 04/12/2021   Vasculitis ([place]) 02/22/2021   Anxiety state 02/12/2021   Body fluid retention 02/12/2021   Cervical intraepithelial neoplasia 02/12/2021   Insomnia 02/12/2021   Pruritus of vulva 02/12/2021   Leg wound, right 10/03/2020   Bilateral primary osteoarthritis of knee 03/30/2019   Paroxysmal atrial fibrillation ([place]) 04/07/2015   Chronic anticoagulation 04/07/2015   Paroxysmal atrial flutter ([place]) 03/16/2015   Essential hypertension, benign 02/24/2014   Sciatica 12/08/2013   Hyperlipidemia 09/25/2013   Osteopenia 11/19/2012   Low back pain 01/26/2010   Carcinoma in situ of breast 09/04/2009   DEPRESSION, PROLONGED 09/04/2009   OA (osteoarthritis) of knee 09/04/2009   PERIPHERAL EDEMA 09/04/2009   COLONIC POLYPS, ADENOMATOUS, HX OF 09/04/2009   Malignant tumor of breast ([place]) 06/11/2009    REFERRING PROVIDER: Dr. Erroll Luna  REFERRING DIAG: Left breast cancer  THERAPY DIAG:  Malignant neoplasm of lower-outer quadrant of left breast  of female, estrogen receptor positive ([place])  Abnormal posture  Chronic pain of right knee  Rationale for Evaluation and Treatment: Rehabilitation  ONSET DATE: 07/30/2022  SUBJECTIVE:                                                                                                                                                                                           SUBJECTIVE STATEMENT: Patient reports she is here today to be seen by her medical team for her newly diagnosed left breast cancer.   PERTINENT HISTORY:  Patient was diagnosed on 07/30/2022 with left grade 2  invasive ductal carcinoma breast cancer. It measures 7 mm and is located in the lower outer quadrant. It is ER/PR positive and HER2 negative with a Ki67 of 20%. She also has a positive axillary lymph node. She has a pacemaker and a previous history of left breast cancer in 2010. She underwent a lumpectomy and sentinel node biopsy (2 negative nodes) followed by tamoxifen. She declined radiation. She has had 4 cardioversions per her report. Also hx of left knee replacement.  PATIENT GOALS:   reduce lymphedema risk and learn post op HEP.   PAIN:  Are you having pain? Yes: NPRS scale: 8/10 Pain location: Right knee; reports she needs a knee replacement but is not currently a candidate Pain description: sharp Aggravating factors: Sit to stand Relieving factors: resting, medication  PRECAUTIONS: Active CA Fall and ICD/Pacemaker  HAND DOMINANCE: right  WEIGHT BEARING RESTRICTIONS: No  FALLS:  Has patient fallen in last 6 months? Yes. Number of falls 3 - tripped in 12/23 which resulted in staples being placed in head. She reports unsteadiness at times. Pt agreed to possibly address this in PT after breast cancer surgery.  LIVING ENVIRONMENT: Patient lives with: her daughter Lives in: House/apartment Has following equipment at home: None  OCCUPATION: Retired  LEISURE: She was regularly going to a gym but reports her husband died in 12/06/19 and she has not exercised since that time  PRIOR LEVEL OF FUNCTION: Independent   OBJECTIVE:  COGNITION: Overall cognitive status: Within functional limits for tasks assessed    POSTURE:  Forward head and rounded shoulders posture  UPPER EXTREMITY AROM/PROM:  A/PROM RIGHT   eval   Shoulder extension 42  Shoulder flexion 130  Shoulder abduction 130  Shoulder internal rotation 55  Shoulder external rotation 66    (Blank rows = not tested)  A/PROM LEFT   eval  Shoulder extension 48  Shoulder flexion 116  Shoulder abduction 118  Shoulder  internal rotation 37  Shoulder external rotation 78    (Blank rows = not tested)  CERVICAL AROM: All within normal limits  UPPER EXTREMITY STRENGTH: Functional  LYMPHEDEMA ASSESSMENTS:   LANDMARK RIGHT   eval  10 cm proximal to olecranon process 28  Olecranon process 23.7  10 cm proximal to ulnar styloid process 21.2  Just proximal to ulnar styloid process 15.1  Across hand at thumb web space 18  At base of 2nd digit 6.1  (Blank rows = not tested)  LANDMARK LEFT   eval  10 cm proximal to olecranon process 29.4  Olecranon process 25.7  10 cm proximal to ulnar styloid process 22.8  Just proximal to ulnar styloid process 15.7  Across hand at thumb web space 17.5  At base of 2nd digit 6  (Blank rows = not tested)  L-DEX LYMPHEDEMA SCREENING:  The patient was assessed using the L-Dex machine today to produce a lymphedema index baseline score. The patient will be reassessed on a regular basis (typically every 3 months) to obtain new L-Dex scores. If the score is > 6.5 points away from his/her baseline score indicating onset of subclinical lymphedema, it will be recommended to wear a compression garment for 4 weeks, 12 hours per day and then be reassessed. If the score continues to be > 6.5 points from baseline at reassessment, we will initiate lymphedema treatment. Assessing in this manner has a 95% rate of preventing clinically significant lymphedema.   L-DEX FLOWSHEETS - 08/09/22 0800       L-DEX LYMPHEDEMA SCREENING   Measurement Type --   Unable to do SOZO; pt has pacemaker            QUICK DASH SURVEY:  Katina Dung - 08/09/22 0001     Open a tight or new jar Moderate difficulty    Do heavy household chores (wash walls, wash floors) Moderate difficulty    Carry a shopping bag or briefcase Moderate difficulty    Wash your back No difficulty    Use a knife to cut food Mild difficulty    Recreational activities in which you take some force or impact through your arm,  shoulder, or hand (golf, hammering, tennis) Severe difficulty    During the past week, to what extent has your arm, shoulder or hand problem interfered with your normal social activities with family, friends, neighbors, or groups? Not at all    During the past week, to what extent has your arm, shoulder or hand problem limited your work or other regular daily activities Not at all    Arm, shoulder, or hand pain. None    Tingling (pins and needles) in your arm, shoulder, or hand None    Difficulty Sleeping No difficulty    DASH Score 22.73 %              PATIENT EDUCATION:  Education details: Lymphedema risk reduction and post op shoulder/posture HEP Person educated: Patient Education method: Explanation, Demonstration, Handout Education comprehension: Patient verbalized understanding and returned demonstration  HOME EXERCISE PROGRAM: Patient was instructed today in a home exercise program today for post op shoulder range of motion. These included active assist shoulder flexion in sitting, scapular retraction, wall walking with shoulder abduction, and hands behind head external rotation.  She was encouraged to do these twice a day, holding 3 seconds and repeating 5 times when permitted by her physician.   ASSESSMENT:  CLINICAL IMPRESSION: Patient was diagnosed on 07/30/2022 with left grade 2 invasive ductal carcinoma breast cancer. It measures 7 mm and is located in the lower outer quadrant. It  is ER/PR positive and HER2 negative with a Ki67 of 20%. She has a pacemaker and a previous history of left breast cancer in 2010. She underwent a lumpectomy and sentinel node biopsy (2 negative nodes) followed by tamoxifen. She declined radiation. She has had 4 cardioversions per her report. Also hx of left knee replacement.Her multidisciplinary medical team met prior to her assessments to determine a recommended treatment plan. She is planning to have a left lumpectomy and targeted node dissection  followed by radiation and anti-estrogen therapy. She will benefit from a post op PT reassessment to determine needs and from L-Dex screens every 3 months for 2 years to detect subclinical lymphedema.  Pt will benefit from skilled therapeutic intervention to improve on the following deficits: Decreased knowledge of precautions, impaired UE functional use, pain, decreased ROM, postural dysfunction.   PT treatment/interventions: ADL/self-care home management, pt/family education, therapeutic exercise  REHAB POTENTIAL: Excellent  CLINICAL DECISION MAKING: Stable/uncomplicated  EVALUATION COMPLEXITY: Low   GOALS: Goals reviewed with patient? YES  LONG TERM GOALS: (STG=LTG)    Name Target Date Goal status  1 Pt will be able to verbalize understanding of pertinent lymphedema risk reduction practices relevant to her dx specifically related to skin care.  Baseline:  No knowledge 08/09/2022 Achieved at eval  2 Pt will be able to return demo and/or verbalize understanding of the post op HEP related to regaining shoulder ROM. Baseline:  No knowledge 08/09/2022 Achieved at eval  3 Pt will be able to verbalize understanding of the importance of attending the post op After Breast CA Class for further lymphedema risk reduction education and therapeutic exercise.  Baseline:  No knowledge 08/09/2022 Achieved at eval  4 Pt will demo she has regained full shoulder ROM and function post operatively compared to baselines.  Baseline: See objective measurements taken today. 10/03/2022     PLAN:  PT FREQUENCY/DURATION: EVAL and 1 follow up appointment.   PLAN FOR NEXT SESSION: will reassess 3-4 weeks post op to determine needs.   Patient will follow up at outpatient cancer rehab 3-4 weeks following surgery.  If the patient requires physical therapy at that time, a specific plan will be dictated and sent to the referring physician for approval. The patient was educated today on appropriate basic range of  motion exercises to begin post operatively and the importance of attending the After Breast Cancer class following surgery.  Patient was educated today on lymphedema risk reduction practices as it pertains to recommendations that will benefit the patient immediately following surgery.  She verbalized good understanding.    Physical Therapy Information for After Breast Cancer Surgery/Treatment:  Lymphedema is a swelling condition that you may be at risk for in your arm if you have lymph nodes removed from the armpit area.  After a sentinel node biopsy, the risk is approximately 5-9% and is higher after an axillary node dissection.  There is treatment available for this condition and it is not life-threatening.  Contact your physician or physical therapist with concerns. You may begin the 4 shoulder/posture exercises (see additional sheet) when permitted by your physician (typically a week after surgery).  If you have drains, you may need to wait until those are removed before beginning range of motion exercises.  A general recommendation is to not lift your arms above shoulder height until drains are removed.  These exercises should be done to your tolerance and gently.  This is not a "no pain/no gain" type of recovery so listen to your  body and stretch into the range of motion that you can tolerate, stopping if you have pain.  If you are having immediate reconstruction, ask your plastic surgeon about doing exercises as he or she may want you to wait. We encourage you to attend the free one time ABC (After Breast Cancer) class offered by [facility].  You will learn information related to lymphedema risk, prevention and treatment and additional exercises to regain mobility following surgery.  You can call [phone] for more information.  This is offered the 1st and 3rd Monday of each month.  You only attend the class one time. While undergoing any medical procedure or treatment, try  to avoid blood pressure being taken or needle sticks from occurring on the arm on the side of cancer.   This recommendation begins after surgery and continues for the rest of your life.  This may help reduce your risk of getting lymphedema (swelling in your arm). An excellent resource for those seeking information on lymphedema is the National Lymphedema Network's web site. It can be accessed at [place].org If you notice swelling in your hand, arm or breast at any time following surgery (even if it is many years from now), please contact your doctor or physical therapist to discuss this.  Lymphedema can be treated at any time but it is easier for you if it is treated early on.  If you feel like your shoulder motion is not returning to normal in a reasonable amount of time, please contact your surgeon or physical therapist.  [facility] [phone]. [address], [place] [address]  ABC CLASS After Breast Cancer Class  After Breast Cancer Class is a specially designed exercise class to assist you in a safe recover after having breast cancer surgery.  In this class you will learn how to get back to full function whether your drains were just removed or if you had surgery a month ago.  This one-time class is held the 1st and 3rd Monday of every month from 11:00 a.m. until 12:00 noon virtually.  This class is FREE and space is limited. For more information or to register for the next available class, call [phone].  Class Goals  Understand specific stretches to improve the flexibility of you chest and shoulder. Learn ways to safely strengthen your upper body and improve your posture. Understand the warning signs of infection and why you may be at risk for an arm infection. Learn about Lymphedema and prevention.  ** You do not attend this class until after surgery.  Drains must be removed to participate  Patient was instructed today in a  home exercise program today for post op shoulder range of motion. These included active assist shoulder flexion in sitting, scapular retraction, wall walking with shoulder abduction, and hands behind head external rotation.  She was encouraged to do these twice a day, holding 3 seconds and repeating 5 times when permitted by her physician.  Annia Friendly, [place] 08/09/22 9:15 AM

## 2022-08-12 ENCOUNTER — Encounter: Payer: Self-pay | Admitting: Genetic Counselor

## 2022-08-12 NOTE — Progress Notes (Signed)
REFERRING PROVIDER: Truitt Merle, MD [address] [place],  [place] [address]  PRIMARY PROVIDER:  Darreld Mclean, MD  PRIMARY REASON FOR VISIT:  1. Malignant neoplasm of upper-outer quadrant of left breast in female, estrogen receptor positive ([place])     HISTORY OF PRESENT ILLNESS:   Sheila Schmidt, a 87 y.o. female, was seen for a [place] cancer genetics consultation at the request of Dr. Burr Medico due to a personal history of breast cancer.  Sheila Schmidt presents to clinic today to discuss the possibility of a hereditary predisposition to cancer, to discuss genetic testing, and to further clarify her future cancer risks, as well as potential cancer risks for family members.   In 2010, at the age of 66, Sheila Schmidt was diagnosed with left invasive ductal carcinoma (ER+/PR+/HER2-) s/p lumpectomy and 5 years of tamoxifen.  She had a left breast biopsy in February 2024 which showed invasive adenocarcinoma (ER+/PR+/HER2-).   The treatment plan is pending.    CANCER HISTORY:  Oncology History  Malignant neoplasm of upper-outer quadrant of left breast in female, estrogen receptor positive ([place])  07/30/2022 Cancer Staging   Staging form: Breast, AJCC 8th Edition - Clinical stage from 07/30/2022: Stage IB (cT1b, cN1, cM0, G2, ER+, PR+, HER2-) - Signed by Truitt Merle, MD on 08/07/2022 Stage prefix: Initial diagnosis Histologic grading system: 3 grade system   08/06/2022 Initial Diagnosis   Malignant neoplasm of upper-outer quadrant of left breast in female, estrogen receptor positive ([place])      Past Medical History:  Diagnosis Date   Cancer ([place])     Left breast carcinoma in situ   Cholelithiasis    Depression    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   Hx of adenomatous colonic polyps    Hyperlipidemia     Past Surgical History:  Procedure Laterality Date   ATRIAL FLUTTER ABLATION     BIV PACEMAKER INSERTION CRT-P N/A 12/04/2021   Procedure: BIV PACEMAKER  INSERTION CRT-P;  Surgeon: Vickie Epley, MD;  Location: [place] CV LAB;  Service: Cardiovascular;  Laterality: N/A;   BREAST BIOPSY Left 07/30/2022   [place] LT BREAST BX W LOC DEV 1ST LESION IMG BX SPEC US GUIDE 07/30/2022 [facility] MAMMOGRAPHY   BREAST EXCISIONAL BIOPSY Left 2010   benign   CARDIOVERSION  05/2020   in [place] N/A 06/21/2021   Procedure: CARDIOVERSION;  Surgeon: Geralynn Rile, MD;  Location: [place];  Service: Cardiovascular;  Laterality: N/A;   CARDIOVERSION N/A 03/26/2022   Procedure: CARDIOVERSION;  Surgeon: Lelon Perla, MD;  Location: [facility] ENDOSCOPY;  Service: Cardiovascular;  Laterality: N/A;   CHOLECYSTECTOMY     CHOLECYSTECTOMY, LAPAROSCOPIC  '06   [place] ARTHROSCOPY     Left '00 [place]/ Right '08   lumpectomy- remote     benign   TOOTH EXTRACTION     TOTAL KNEE ARTHROPLASTY  02/05/2011   left    FAMILY HISTORY:  We obtained a detailed, 4-generation family history.  Significant diagnoses are listed below: Family History  Problem Relation Age of Onset   Liver cancer Father 101     Sheila Schmidt is unaware of previous family history of genetic testing for hereditary cancer risks. Patient's maternal ancestors are of [place] descent, and paternal ancestors are of [place] descent. There is no reported Ashkenazi Jewish ancestry. There is no known consanguinity.  GENETIC COUNSELING ASSESSMENT: Sheila Schmidt is a 87 y.o. female with a personal  history of cancer which is somewhat suggestive of a hereditary cancer syndrome given her breast cancer diagnoses in both 2010 and 2024. We, therefore, discussed and recommended the following at today's visit.   DISCUSSION: We discussed that 5 - 10% of cancer is hereditary.  Most cases of hereditary breast cancer are associated with mutations in BRCA1/2.  There are other genes that can be associated with hereditary breast cancer syndromes.  We discussed that testing is beneficial for several  reasons including knowing how to follow individuals for their cancer risks and understanding if other family members could be at risk for cancer and allowing them to undergo genetic testing.   We reviewed the characteristics, features and inheritance patterns of hereditary cancer syndromes. We also discussed genetic testing, including the appropriate family members to test, the process of testing, insurance coverage and turn-around-time for results. We discussed the implications of a negative, positive, carrier and/or variant of uncertain significant result. We recommended Sheila Schmidt pursue genetic testing for a panel that includes genes associated with breast cancer and other cancers.    The [facility] Common Hereditary Cancers + RNA Panel includes sequencing, deletion/duplication, and RNA analysis of the following 48 genes: APC, ATM, AXIN2, BAP1, BARD1, BMPR1A, BRCA1, BRCA2, BRIP1, CDH1, CDK4*, CDKN2A*, CHEK2, CTNNA1, DICER1, EPCAM* (del/dup only), FH, GREM1* (promoter dup analysis only), HOXB13*, KIT*, MBD4*, MEN1, MLH1, MSH2, MSH3, MSH6, MUTYH, NF1, NTHL1, PALB2, PDGFRA*, PMS2, POLD1, POLE, PTEN, RAD51C, RAD51D, SDHA (sequencing only), SDHB, SDHC, SDHD, SMAD4, SMARCA4, STK11, TP53, TSC1, TSC2, VHL.  *Genes without RNA analysis.   Based on Sheila Schmidt's personal history of breast cancer, she meets medical criteria for genetic testing. Despite that she meets criteria, she may still have an out of pocket cost. We discussed that if her out of pocket cost for testing is over $100, the laboratory should contact her and discuss the self-pay prices and/or patient pay assistance programs.    PLAN: After considering the risks, benefits, and limitations, Sheila Schmidt provided informed consent to pursue genetic testing and the blood sample was sent to [facility] for analysis of the Common Hereditary Cancers +RNA Panel. Results should be available within approximately 3 weeks' time, at which point they will  be disclosed by telephone to Sheila Schmidt, as will any additional recommendations warranted by these results. Sheila Schmidt will receive a summary of her genetic counseling visit and a copy of her results once available. This information will also be available in Epic.   Sheila Schmidt's questions were answered to her satisfaction today. Our contact information was provided should additional questions or concerns arise. Thank you for the referral and allowing [place] to share in the care of your patient.   Sheila Schmidt M. Joette Catching, [place], [facility] Genetic Counselor [email or web address] (P) [phone]  The patient was seen for a total of 15 minutes in face-to-face genetic counseling.  She was accompanied by daughter, Sheila Schmidt.  Dr. Burr Medico was available to discuss this case as needed.   _______________________________________________________________________ For Office Staff:  Number of people involved in session: 2 Was an Intern/ student involved with case: no

## 2022-08-13 ENCOUNTER — Other Ambulatory Visit: Payer: Self-pay | Admitting: *Deleted

## 2022-08-13 ENCOUNTER — Telehealth: Payer: Self-pay | Admitting: Emergency Medicine

## 2022-08-13 DIAGNOSIS — Z170 Estrogen receptor positive status [ER+]: Secondary | ICD-10-CM

## 2022-08-13 NOTE — Telephone Encounter (Signed)
[employer] 2021-05 - Specimen Collection Study to Evaluate Biomarkers in Subjects with Cancer   Called to f/u on interest in study.  Patient is not interested in participating at this time, does not want additional appts or commitments.  Patient denies any questions or concerns at this time, aware to f/u as needed.  Wells Guiles 'Learta Codding' Neysa Bonito, RN, BSN, [facility] Clinical Research Nurse I 08/13/22 11:34 AM

## 2022-08-14 ENCOUNTER — Telehealth: Payer: Self-pay | Admitting: Family Medicine

## 2022-08-14 ENCOUNTER — Other Ambulatory Visit: Payer: Self-pay | Admitting: Surgery

## 2022-08-14 ENCOUNTER — Encounter: Payer: Self-pay | Admitting: Cardiology

## 2022-08-14 ENCOUNTER — Encounter: Payer: Self-pay | Admitting: *Deleted

## 2022-08-14 ENCOUNTER — Encounter: Payer: Self-pay | Admitting: General Practice

## 2022-08-14 ENCOUNTER — Telehealth: Payer: Self-pay | Admitting: *Deleted

## 2022-08-14 DIAGNOSIS — C50912 Malignant neoplasm of unspecified site of left female breast: Secondary | ICD-10-CM

## 2022-08-14 NOTE — Telephone Encounter (Signed)
Called pt regarding Sheila Schmidt from [phone]. No answer left vm with contact information for questions or needs.

## 2022-08-14 NOTE — Progress Notes (Signed)
[place] Psychosocial Distress Screening Spiritual Care  Met with Sheila Schmidt and her oldest daughter, who lives with her and is a key caregiver, in [facility] to introduce [place] team/resources, reviewing distress screen per protocol.  The patient scored a 8 on the Psychosocial Distress Thermometer which indicates severe distress. Also assessed for distress and other psychosocial needs.      08/14/2022    2:22 PM  ONCBCN DISTRESS SCREENING  Screening Type Initial Screening  Distress experienced in past week (1-10) 8  Emotional problem type Depression;Nervousness/Anxiety;Adjusting to illness  Information Concerns Type Lack of info about treatment;Lack of info about maintaining fitness  Physical Problem type Pain;Breathing;Skin dry/itchy  Referral to support programs Yes    Chaplain and patient discussed common feelings and emotions when being diagnosed with cancer, and the importance of support during treatment.  Chaplain informed patient of the support team and support services at [facility].  Chaplain provided contact information and encouraged patient to call with any questions or concerns.  [name] and her daughter are trying to be as proactive as possible with preparing for surgery. Provided pastoral presence and normalization of feelings. Answered questions about Advance Directives and Five Wishes.   Follow up needed: No. [name] plans to phone chaplain as needed/desired.   [place], [place], [facility] Pager [phone] Voicemail [phone]

## 2022-08-14 NOTE — Telephone Encounter (Signed)
Per 3/5 IB reached out to schedule patient, spoke with daughter, she is aware of date and time of appointment.

## 2022-08-15 ENCOUNTER — Telehealth: Payer: Self-pay | Admitting: *Deleted

## 2022-08-15 NOTE — Telephone Encounter (Signed)
Will forward this to the pre op call back. I am out of the office tomorrow though there will be coverage in pre op call back to assist in this matter.

## 2022-08-15 NOTE — Telephone Encounter (Signed)
-----   Message from Baldwin [place], [place] sent at 08/15/2022  6:01 PM EST ----- Arbie Cookey,  Can you help out with this ?  I guess probably both the clearance and the pharmacy pool Very much appreciate your help  Renee  ----- Message ----- From: Darreld Mclean, MD Sent: 08/15/2022   4:37 PM EST To: Baldwin [place], PA-C  Hi Renee- I think you saw this patient most recently from a cardiology standpoint.  Unfortunately she her breast cancer has come back, she needs to have a lumpectomy.  [place] [facility] sent me a preoperative clearance form, also requesting recommendations about holding her Xarelto for surgery.  Could you put this patient on the list for preoperative clearance through cardiology?  I am not sure if [facility] [place] [facility] reached out to you guys or not already  [facility]

## 2022-08-16 ENCOUNTER — Encounter: Payer: Self-pay | Admitting: Family Medicine

## 2022-08-16 NOTE — Telephone Encounter (Signed)
   Pre-operative Risk Assessment    Patient Name: Sheila Schmidt  DOB: September 15, 1935 MRN: [id number]      Request for Surgical Clearance    Procedure:   Lumpectomy  Date of Surgery:  Clearance TBD                                 Surgeon:  Dr. Erroll Luna Surgeon's Group or Practice Name:  [facility] Phone number:  [phone] Fax number:  [phone]   Type of Clearance Requested:   - Medical  - Pharmacy:  Hold Rivaroxaban (Xarelto) Not Indicated   Type of Anesthesia:  Not Indicated   Additional requests/questions:    Signed, Greer Ee   08/16/2022, 7:27 AM

## 2022-08-16 NOTE — Telephone Encounter (Signed)
Hi Renee, you saw this patient on 06/25/22. Could you please comment regarding medical clearance on upcoming lumpectomy. Please route your response to p cv div preop.  Thank you, Sharyn Lull

## 2022-08-16 NOTE — Telephone Encounter (Signed)
Patient with diagnosis of atrial fibrillation on Xarelto for anticoagulation.    Procedure:   Lumpectomy   Date of Surgery:  Clearance TBD      CHA2DS2-VASc Score = 5   This indicates a 7.2% annual risk of stroke. The patient's score is based upon: CHF History: 1 HTN History: 1 Diabetes History: 0 Stroke History: 0 Vascular Disease History: 0 Age Score: 2 Gender Score: 1   CrCl 56 Platelet count 233  Per office protocol, patient can hold Xarelto for 2 days prior to procedure.   Patient will not need bridging with Lovenox (enoxaparin) around procedure.  **This guidance is not considered finalized until pre-operative APP has relayed final recommendations.**

## 2022-08-20 ENCOUNTER — Other Ambulatory Visit: Payer: Self-pay

## 2022-08-20 MED ORDER — AMIODARONE HCL 200 MG PO TABS: 200.0000 mg | ORAL_TABLET | Freq: Every day | ORAL | 2 refills | Status: DC

## 2022-08-21 NOTE — Telephone Encounter (Signed)
As of my visit with the patient in January, at that time getting over a pneumonia. no clear cardiac or EP contraindications to lumpectomy.   RCRI score is one, 0.9% risk for her hx of NICM/CHF She will need standard peri-operative pacemaker management.  Pharmacy has addressed her anticoagulation  Tommye Standard, PA-C

## 2022-08-21 NOTE — Telephone Encounter (Signed)
   Patient Name: Sheila Schmidt  DOB: Oct 09, 1935 MRN: [id number]  Primary Cardiologist: None  Chart reviewed as part of pre-operative protocol coverage. Given past medical history and time since last visit, based on ACC/AHA guidelines, Sheila Schmidt is at acceptable risk for the planned procedure without further cardiovascular testing.  Per Tommye Standard, PA patient has no clear cardiac or EP contraindications to lumpectomy. RCRI score is one, 0.9% risk for her hx of NICM/CHF. She will need standard peri-operative pacemaker management.   The patient was advised that if she develops new symptoms prior to surgery to contact our office to arrange for a follow-up visit, and she verbalized understanding.  Per office protocol, patient can hold Xarelto for 2 days prior to procedure.   Patient will not need bridging with Lovenox (enoxaparin) around procedure.  I will route this recommendation to the requesting party via Epic fax function and remove from pre-op pool.  Please call with questions.  Mable Fill, Marissa Nestle, NP 08/21/2022, 9:36 AM

## 2022-08-28 ENCOUNTER — Encounter: Payer: Self-pay | Admitting: Family Medicine

## 2022-08-31 ENCOUNTER — Encounter: Payer: Self-pay | Admitting: Cardiology

## 2022-08-31 NOTE — Progress Notes (Signed)
PERIOPERATIVE PRESCRIPTION FOR IMPLANTED CARDIAC DEVICE PROGRAMMING  Patient Information: Name:  Sheila Schmidt  DOB:  [birth year]  MRN:  [id number]    Planned Procedure:  Left Breast Seed Lumpectomy with Left Seed Targeted Axillary Lymph Node Biopsy and Left Sentinel Lymph Node Mapping  Surgeon:  Dr. Erroll Luna  Date of Procedure:  09/11/2022  Cautery will be used.  Position during surgery:  Supine   Please send documentation back to:  [facility] (Fax # [phone])   Device Information:  Clinic EP Physician:  Dr. Quentin Ore   Device Type:  Pacemaker Manufacturer and Phone #:  St. Jude/Abbott: [phone] Pacemaker Dependent?:  No. Date of Last Device Check:  06/25/22 Normal Device Function?:  Yes.    Electrophysiologist's Recommendations:  Have magnet available. Provide continuous ECG monitoring when magnet is used or reprogramming is to be performed.  Procedure may interfere with device function.  Magnet should be placed over device during procedure.  Per Device Clinic Standing Orders, Wanda Plump, RN  3:06 PM 08/31/2022

## 2022-08-31 NOTE — Progress Notes (Signed)
Abbott Rep paged to notify of surgery 3/22 at 1345. Response pending.  Orders requested from [facility] 3/22 at 1337. Response pending.

## 2022-08-31 NOTE — Pre-Procedure Instructions (Signed)
Surgical Instructions    Your procedure is scheduled on September 11, 2022.  Report to [facility] Main Entrance "A" at 5:30 A.M., then check in with the Admitting office.  Call this number if you have problems the morning of surgery:  [phone]  If you have any questions prior to your surgery date call [phone]: Open Monday-Friday 8am-4pm If you experience any cold or flu symptoms such as cough, fever, chills, shortness of breath, etc. between now and your scheduled surgery, please notify us at the above number.     Remember:  Do not eat after midnight the night before your surgery  You may drink clear liquids until 4:30 AM the morning of your surgery.   Clear liquids allowed are: Water, Non-Citrus Juices (without pulp), Carbonated Beverages, Clear Tea, Black Coffee Only (NO MILK, CREAM OR POWDERED CREAMER of any kind), and Gatorade.     Take these medicines the morning of surgery with A SIP OF WATER:  amiodarone (PACERONE)   FLUoxetine (PROZAC)   acetaminophen (TYLENOL) - may take if needed  famotidine (PEPCID) - may take if needed   As of today, STOP taking any Aspirin (unless otherwise instructed by your surgeon) Aleve, Naproxen, Ibuprofen, Motrin, Advil, Goody's, BC's, all herbal medications, fish oil, and all vitamins.                     Do NOT Smoke (Tobacco/Vaping) for 24 hours prior to your procedure.  If you use a CPAP at night, you may bring your mask/headgear for your overnight stay.   Contacts, glasses, piercing's, hearing aid's, dentures or partials may not be worn into surgery, please bring cases for these belongings.    For patients admitted to the hospital, discharge time will be determined by your treatment team.   Patients discharged the day of surgery will not be allowed to drive home, and someone needs to stay with them for 24 hours.  SURGICAL WAITING ROOM VISITATION Patients having surgery or a procedure may have no more than 2 support people in the  waiting area - these visitors may rotate.   Children under the age of 72 must have an adult with them who is not the patient. If the patient needs to stay at the hospital during part of their recovery, the visitor guidelines for inpatient rooms apply. Pre-op nurse will coordinate an appropriate time for 1 support person to accompany patient in pre-op.  This support person may not rotate.   Please refer to the [facility] website for the visitor guidelines for Inpatients (after your surgery is over and you are in a regular room).    Special instructions:   [place]- Preparing For Surgery  Before surgery, you can play an important role. Because skin is not sterile, your skin needs to be as free of germs as possible. You can reduce the number of germs on your skin by washing with CHG (chlorahexidine gluconate) Soap before surgery.  CHG is an antiseptic cleaner which kills germs and bonds with the skin to continue killing germs even after washing.    Oral Hygiene is also important to reduce your risk of infection.  Remember - BRUSH YOUR TEETH THE MORNING OF SURGERY WITH YOUR REGULAR TOOTHPASTE  Please do not use if you have an allergy to CHG or antibacterial soaps. If your skin becomes reddened/irritated stop using the CHG.  Do not shave (including legs and underarms) for at least 48 hours prior to first CHG shower. It is  OK to shave your face.  Please follow these instructions carefully.   Shower the NIGHT BEFORE SURGERY and the MORNING OF SURGERY  If you chose to wash your hair, wash your hair first as usual with your normal shampoo.  After you shampoo, rinse your hair and body thoroughly to remove the shampoo.  Use CHG Soap as you would any other liquid soap. You can apply CHG directly to the skin and wash gently with a scrungie or a clean washcloth.   Apply the CHG Soap to your body ONLY FROM THE NECK DOWN.  Do not use on open wounds or open sores. Avoid contact with your eyes, ears,  mouth and genitals (private parts). Wash Face and genitals (private parts)  with your normal soap.   Wash thoroughly, paying special attention to the area where your surgery will be performed.  Thoroughly rinse your body with warm water from the neck down.  DO NOT shower/wash with your normal soap after using and rinsing off the CHG Soap.  Pat yourself dry with a CLEAN TOWEL.  Wear CLEAN PAJAMAS to bed the night before surgery  Place CLEAN SHEETS on your bed the night before your surgery  DO NOT SLEEP WITH PETS.   Day of Surgery: Take a shower with CHG soap. Do not wear jewelry or makeup Do not wear lotions, powders, perfumes/colognes, or deodorant. Do not shave 48 hours prior to surgery.  Men may shave face and neck. Do not bring valuables to the hospital.  [facility] is not responsible for any belongings or valuables. Do not wear nail polish, gel polish, artificial nails, or any other type of covering on natural nails (fingers and toes) If you have artificial nails or gel coating that need to be removed by a nail salon, please have this removed prior to surgery. Artificial nails or gel coating may interfere with anesthesia's ability to adequately monitor your vital signs.  Wear Clean/Comfortable clothing the morning of surgery Remember to brush your teeth WITH YOUR REGULAR TOOTHPASTE.   Please read over the following fact sheets that you were given.    If you received a COVID test during your pre-op visit  it is requested that you wear a mask when out in public, stay away from anyone that may not be feeling well and notify your surgeon if you develop symptoms. If you have been in contact with anyone that has tested positive in the last 10 days please notify you surgeon.

## 2022-09-03 ENCOUNTER — Other Ambulatory Visit: Payer: Self-pay

## 2022-09-03 ENCOUNTER — Encounter (HOSPITAL_COMMUNITY)
Admission: RE | Admit: 2022-09-03 | Discharge: 2022-09-03 | Disposition: A | Discharge status: Home / Self Care | Payer: Medicare Other | Source: Ambulatory Visit | Attending: Surgery | Admitting: Surgery

## 2022-09-03 ENCOUNTER — Encounter (HOSPITAL_COMMUNITY): Payer: Self-pay

## 2022-09-03 VITALS — BP 138/65 | HR 59 | Temp 97.9°F | Resp 18 | Ht 62.0 in | Wt 178.2 lb

## 2022-09-03 DIAGNOSIS — I480 Paroxysmal atrial fibrillation: Secondary | ICD-10-CM | POA: Insufficient documentation

## 2022-09-03 DIAGNOSIS — Z01818 Encounter for other preprocedural examination: Secondary | ICD-10-CM

## 2022-09-03 DIAGNOSIS — I4891 Unspecified atrial fibrillation: Secondary | ICD-10-CM | POA: Diagnosis not present

## 2022-09-03 DIAGNOSIS — Z01812 Encounter for preprocedural laboratory examination: Secondary | ICD-10-CM | POA: Diagnosis not present

## 2022-09-03 DIAGNOSIS — E785 Hyperlipidemia, unspecified: Secondary | ICD-10-CM | POA: Insufficient documentation

## 2022-09-03 DIAGNOSIS — I4892 Unspecified atrial flutter: Secondary | ICD-10-CM | POA: Insufficient documentation

## 2022-09-03 DIAGNOSIS — C50912 Malignant neoplasm of unspecified site of left female breast: Secondary | ICD-10-CM | POA: Diagnosis not present

## 2022-09-03 DIAGNOSIS — I429 Cardiomyopathy, unspecified: Secondary | ICD-10-CM | POA: Diagnosis not present

## 2022-09-03 HISTORY — DX: Presence of cardiac pacemaker: Z95.0

## 2022-09-03 HISTORY — DX: Cardiac murmur, unspecified: R01.1

## 2022-09-03 HISTORY — DX: Cardiac arrhythmia, unspecified: I49.9

## 2022-09-03 LAB — BASIC METABOLIC PANEL
Anion gap: 7 (ref 5–15)
BUN: 14 mg/dL (ref 8–23)
CO2: 26 mmol/L (ref 22–32)
Calcium: 9.2 mg/dL (ref 8.9–10.3)
Chloride: 102 mmol/L (ref 98–111)
Creatinine, Ser: 1.1 mg/dL — ABNORMAL HIGH (ref 0.44–1.00)
GFR, Estimated: 50 mL/min — ABNORMAL LOW (ref >60)
Glucose, Bld: 92 mg/dL (ref 70–99)
Potassium: 4.3 mmol/L (ref 3.5–5.1)
Sodium: 135 mmol/L (ref 135–145)

## 2022-09-03 LAB — CBC
HCT: 40.8 % (ref 36.0–46.0)
Hemoglobin: 13.6 g/dL (ref 12.0–15.0)
MCH: 33.7 pg (ref 26.0–34.0)
MCHC: 33.3 g/dL (ref 30.0–36.0)
MCV: 101 fL — ABNORMAL HIGH (ref 80.0–100.0)
Platelets: 181 10*3/uL (ref 150–400)
RBC: 4.04 MIL/uL (ref 3.87–5.11)
RDW: 12.8 % (ref 11.5–15.5)
WBC: 7.9 10*3/uL (ref 4.0–10.5)
nRBC: 0 % (ref 0.0–0.2)

## 2022-09-03 NOTE — Progress Notes (Addendum)
PCP - Dr. Janett Billow [place] Cardiologist - Dr. Jenne Campus  PPM/ICD - PPM Device Orders - Pending Rep Notified - Yes  Chest x-ray - 06/18/22 EKG - 06/25/22 Stress Test - 12/22/15 ECHO - 11/23/21 Cardiac Cath - Denies  Sleep Study - Denies  Diabetes: Denies  Blood Thinner Instructions: N/A Aspirin Instructions: N/A  ERAS Protcol - Yes PRE-SURGERY Ensure or G2- No  COVID TEST- N/A   Anesthesia review: Yes, cardiac hx (clearance 08/15/22); Pacemaker from [facility]; device orders pending  Patient denies shortness of breath, fever, cough and chest pain at PAT appointment   All instructions explained to the patient, with a verbal understanding of the material. Patient agrees to go over the instructions while at home for a better understanding. Patient also instructed to self quarantine after being tested for COVID-19. The opportunity to ask questions was provided.

## 2022-09-04 ENCOUNTER — Ambulatory Visit (INDEPENDENT_AMBULATORY_CARE_PROVIDER_SITE_OTHER): Payer: Medicare Other

## 2022-09-04 ENCOUNTER — Ambulatory Visit (HOSPITAL_BASED_OUTPATIENT_CLINIC_OR_DEPARTMENT_OTHER)
Admission: RE | Admit: 2022-09-04 | Discharge: 2022-09-04 | Disposition: A | Discharge status: Home / Self Care | Payer: Medicare Other | Source: Ambulatory Visit | Attending: Family Medicine | Admitting: Family Medicine

## 2022-09-04 DIAGNOSIS — J189 Pneumonia, unspecified organism: Secondary | ICD-10-CM | POA: Diagnosis not present

## 2022-09-04 DIAGNOSIS — I428 Other cardiomyopathies: Secondary | ICD-10-CM

## 2022-09-04 LAB — CUP PACEART REMOTE DEVICE CHECK
Battery Remaining Longevity: 80 mo
Battery Remaining Percentage: 90 %
Battery Voltage: 2.99 V
Brady Statistic AP VP Percent: 86 %
Brady Statistic AP VS Percent: 1 %
Brady Statistic AS VP Percent: 6.2 %
Brady Statistic AS VS Percent: 7.1 %
Brady Statistic RA Percent Paced: 87 %
Date Time Interrogation Session: 20240326041602
Implantable Lead Connection Status: 753985
Implantable Lead Connection Status: 753985
Implantable Lead Connection Status: 753985
Implantable Lead Implant Date: 20230626
Implantable Lead Implant Date: 20230626
Implantable Lead Implant Date: 20230626
Implantable Lead Location: 753858
Implantable Lead Location: 753859
Implantable Lead Location: 753860
Implantable Pulse Generator Implant Date: 20230626
Lead Channel Impedance Value: 410 Ohm
Lead Channel Impedance Value: 510 Ohm
Lead Channel Impedance Value: 900 Ohm
Lead Channel Pacing Threshold Amplitude: 0.5 V
Lead Channel Pacing Threshold Amplitude: 0.75 V
Lead Channel Pacing Threshold Amplitude: 1.125 V
Lead Channel Pacing Threshold Pulse Width: 0.5 ms
Lead Channel Pacing Threshold Pulse Width: 0.5 ms
Lead Channel Pacing Threshold Pulse Width: 0.5 ms
Lead Channel Sensing Intrinsic Amplitude: 1.1 mV
Lead Channel Sensing Intrinsic Amplitude: 11.4 mV
Lead Channel Setting Pacing Amplitude: 2.125
Lead Channel Setting Pacing Amplitude: 2.5 V
Lead Channel Setting Pacing Amplitude: 2.5 V
Lead Channel Setting Pacing Pulse Width: 0.5 ms
Lead Channel Setting Pacing Pulse Width: 0.5 ms
Lead Channel Setting Sensing Sensitivity: 2 mV
Pulse Gen Model: 3562
Pulse Gen Serial Number: 8071621

## 2022-09-04 NOTE — Telephone Encounter (Signed)
Is this the xray dated 06/18/21?

## 2022-09-04 NOTE — Telephone Encounter (Signed)
X-ray is done, await read

## 2022-09-04 NOTE — Anesthesia Preprocedure Evaluation (Addendum)
Anesthesia Evaluation  Patient identified by MRN, date of birth, ID band Patient awake    Reviewed: Allergy & Precautions, NPO status , Patient's Chart, lab work & pertinent test results  Airway Mallampati: II  TM Distance: >3 FB Neck ROM: Full  Mouth opening: Limited Mouth Opening  Dental no notable dental hx. (+) Dental Advisory Given, Teeth Intact   Pulmonary    Pulmonary exam normal breath sounds clear to auscultation       Cardiovascular hypertension, Normal cardiovascular exam+ dysrhythmias Atrial Fibrillation + pacemaker + Valvular Problems/Murmurs (mild) AI  Rhythm:Regular Rate:Normal  cardiomyopathy  09/06/2022 TTE Left Ventricle: Left ventricular ejection fraction, by estimation, is 55  to 60%. The left ventricle has normal function. The left ventricle has no  regional wall motion abnormalities. The left ventricular internal cavity  size was normal in size. There is   no left ventricular hypertrophy. Left ventricular diastolic parameters  are consistent with Grade II diastolic dysfunction (pseudonormalization).   Right Ventricle: The right ventricular size is normal. No increase in  right ventricular wall thickness. Right ventricular systolic function is  normal. There is severely elevated pulmonary artery systolic pressure. The  tricuspid regurgitant velocity is  4.06 m/s, and with an assumed right atrial pressure of 3 mmHg, the  estimated right ventricular systolic pressure is 123456 mmHg.      Neuro/Psych  PSYCHIATRIC DISORDERS Anxiety Depression       GI/Hepatic negative GI ROS, Neg liver ROS,,,  Endo/Other    Renal/GU Lab Results      Component                Value               Date                      CREATININE               1.10 (H)            09/03/2022                 K                        4.3                 09/03/2022                    Musculoskeletal  (+) Arthritis ,    Abdominal    Peds  Hematology Lab Results      Component                Value               Date                      WBC                      7.9                 09/03/2022                HGB                      13.6                09/03/2022  HCT                      40.8                09/03/2022                        PLT                      181                 09/03/2022           Pt on Xarelto   Anesthesia Other Findings L Breast CA  Reproductive/Obstetrics                             Anesthesia Physical Anesthesia Plan  ASA: 3  Anesthesia Plan: General   Post-op Pain Management: Regional block*, Minimal or no pain anticipated and Tylenol PO (pre-op)*   Induction:   PONV Risk Score and Plan: 4 or greater and Treatment may vary due to age or medical condition and Ondansetron  Airway Management Planned: LMA  Additional Equipment: None  Intra-op Plan:   Post-operative Plan: Extubation in OR  Informed Consent: I have reviewed the patients History and Physical, chart, labs and discussed the procedure including the risks, benefits and alternatives for the proposed anesthesia with the patient or authorized representative who has indicated his/her understanding and acceptance.     Dental advisory given  Plan Discussed with: CRNA, Anesthesiologist and Surgeon  Anesthesia Plan Comments: (PAT note written by Myra Gianotti, PA-C. She has a Abbott/St. Jude PPM, left chest.   Routine follow-up and preoperative evaluation by her primary cardiologist Dr. Agustin Cree on 09/06/22: "Overall because of cardiomyopathy she is at moderate risk for complication from cardiac standpoint review. We need to be very careful and meticulous about management of fluids. Will try to get echocardiogram before which will help [place] to determine how aggressive we can be with fluids and her. " Echo was subsequently done on 09/06/22 and read by Dr. Agustin Cree. It showed LVEF  recovered at 55-60% (previously ~ 30%), no regional wall motion abnormalities, grade II diastolic dysfunction, normal RVSF, severely elevated PASP with estimated RVSP 68.9 mmHg (RVSP 39.2 mmHg 04/21/21), mild MR.  )       Anesthesia Quick Evaluation

## 2022-09-04 NOTE — Progress Notes (Signed)
Anesthesia Chart Review:  Case: [id number] Date/Time: 09/11/22 0715   Procedures:      LEFT BREAST LUMPECTOMY WITH RADIOACTIVE SEED AND SENTINEL LYMPH NODE BIOPSY (Left: Breast)     RADIOACTIVE SEED GUIDED LEFT SENTINEL LYMPH NODE BIOPSY (Left)   Anesthesia type: General   Pre-op diagnosis: LEFT BREAST CANCER   Location: [facility] ROOM 01 / [facility]   Surgeons: Erroll Luna, MD       DISCUSSION: Patient is an 87 year old female scheduled for the above procedure.   History includes never smoker, afib (diagnosed 2016, s/p DCCV 06/21/21, 03/26/22), cardiomyopathy, murmur (mild MR, trivial AI 11/2021), BiV PPM (Abbott/St. Jude CRT-P 12/04/21), left BBB, HLD, left breast cancer (s/p left partial mastectomy 03/22/09; 07/30/22: IDC left breast, + LN), osteoarthritis (left TKA 03/08/11). Left CAP, s/p amoxicillin and azithromycin 06/06/22->Cefdinir 06/18/22.  Perioperative cardiology input outlined on 08/21/22 by Rebekah Chesterfield, NP: " Given past medical history and time since last visit, based on ACC/AHA guidelines, Ruthmary Holderby Perkovich is at acceptable risk for the planned procedure without further cardiovascular testing.  Per Tommye Standard, PA patient has no clear cardiac or EP contraindications to lumpectomy. RCRI score is one, 0.9% risk for her hx of NICM/CHF. She will need standard peri-operative pacemaker management... Per office protocol, patient can hold Xarelto for 2 days prior to procedure.   Patient will not need bridging with Lovenox (enoxaparin) around procedure." PAT RN confirmed patient aware of Xarelto instructions.   EP PPM Perioperative Recommendations: Device Information: Clinic EP Physician:  Dr. Quentin Ore  Device Type:  Pacemaker Manufacturer and Phone #:  St. Jude/Abbott: [phone] Pacemaker Dependent?:  No. Date of Last Device Check:  06/25/22           Normal Device Function?:  Yes.     Electrophysiologist's Recommendations: Have magnet available. Provide continuous ECG monitoring when  magnet is used or reprogramming is to be performed.  Procedure may interfere with device function.  Magnet should be placed over device during procedure.    She is scheduled for RSL on 09/10/22 at 2:00 PM. Anesthesia team to evaluate on the day of surgery. EP recommended Magnet during procedure; however confirmed with anesthesiologist Renold Don, MD that given left chest PPM with close proximity to her surgical site that rep will be needed. I contacted Abbott/St. Jude representative who is aware they will be needed on the day of surgery, date and time provided. As of 06/25/22, she was not pacemaker dependent.    VS: BP 138/65   Pulse (!) 59   Temp 36.6 C   Resp 18   Ht 5\' 2"  (1.575 m)   Wt 80.8 kg   SpO2 99%   BMI 32.59 kg/m    PROVIDERS: Copland, Gay Filler, MD is PCP   Jenne Campus, MD is cardiologist. Last visit 05/16/22. GDMT limited due to low BP. Feeling better in NSR.   Lars Mage, MD is EP cardiologist. Last visit with Tommye Standard, PA-C on 06/25/22. No known recurrent afib since 03/2022 DCCV. On Xarelto and amiodarone at that time. If recurrence, consider afib ablation.  Gery Pray, MD is RAD-ONC. Per 08/08/22 evaluation, "Situation is somewhat complex in light of the pacemaker in her left upper chest. She definitely needs it for pacing issues. Ideally, would recommend XRT given her node positivity, which would include the left breast and axillary areas. To complete this, her pacemaker would need to be transpositioned to the right chest.    The patient has ultimately opted  against pursuing radiation therapy at this time with the understanding that she would be at increased risk for recurrence.  Given her age and medical issues she is willing to accept this increased risk."  Truitt Merle, MD is HEM-ONC   LABS: Labs reviewed: Acceptable for surgery. AST 18, ALT 17 on 08/08/22. A1c 5.0% 11/20/21.  (all labs ordered are listed, but only abnormal results are  displayed)  Labs Reviewed  BASIC METABOLIC PANEL - Abnormal; Notable for the following components:      Result Value   Creatinine, Ser 1.10 (*)    GFR, Estimated 50 (*)    All other components within normal limits  CBC - Abnormal; Notable for the following components:   MCV 101.0 (*)    All other components within normal limits     IMAGES: CXR 06/18/22 (left CAP 06/06/22): IMPRESSION: Mild right costophrenic angle heterogeneous airspace opacity is slightly increased from 05/23/2022 and new from 12/05/2021. Minimal patchy lateral left lower lung airspace opacity is also new from 12/05/2021. Findings may represent residual pneumonia.    EKG: 06/25/22: AV sequential or dual chamber electronic pacemaker.   CV: Echo 11/23/21: IMPRESSIONS   1. Diffuse hypokinesis, worse in the inferior, inferoseptal walls. Poor  acoustic windows make evaluation and comparison to previous echo from 2022  difficult. OVerall, LVEF appears a little worse Consider limited echo with  Definity to confirm LV wall  motion. Left ventricular ejection fraction, by estimation, is 30%. The  left ventricle has moderately decreased function. Left ventricular  diastolic parameters are indeterminate.   2. Right ventricular systolic function is low normal. The right  ventricular size is normal. There is mildly elevated pulmonary artery  systolic pressure.   3. Left atrial size was mildly dilated.   4. The mitral valve is normal in structure. Mild mitral valve  regurgitation.   5. The aortic valve is tricuspid. Aortic valve regurgitation is trivial.  Aortic valve sclerosis is present, with no evidence of aortic valve  stenosis.  - Restoration to normal rhythm recommended given decreased in LVEF. Referred for CRT-P 12/04/21 and DCCV 03/26/22; Comparison: LVEF 45-50%, septal hypokinesis , RVSP 39.2 mmHg, trivial AI 04/21/21; EF 50-55%, anteroseptal and inferoseptal hypokinesis 04/19/15   Long term monitor   07/19/21-08/02/21:  - Patient had a min HR of 31 bpm, max HR of 126 bpm, and avg HR of 51 bpm. Predominant underlying rhythm was Sinus Rhythm. 3 Supraventricular Tachycardia runs occurred, the run with the fastest interval lasting 12 beats with a max rate of 126 bpm (avg 114  bpm); the run with the fastest interval was also the longest. Atrial Fibrillation/Flutter occurred (1% burden), ranging from 57-108 bpm (avg of 88 bpm), the longest lasting 3 hours 48 mins with an avg rate of 88 bpm. Atrial Flutter may be possible Atrial Tachycardia with variable block. Isolated SVEs were rare (<1.0%), SVE Couplets were rare (<1.0%), and SVE Triplets were rare (<1.0%). Isolated VEs were rare (<1.0%), and no VE Couplets or VE Triplets were present.   Summary and conclusions: Episode of atrial fibrillation total burden of 1% with average heart rate of 88, total duration of atrial fibrillation 3 hours 48 minutes. 3 episode of supraventricular tachycardia.   Nuclear stress test 12/22/15: There was no ST segment deviation noted during stress. This is a low risk study. Mild anteroseptal decreased radiotracer uptake seen at both rest and stress consistent with left bundle branch block artifact. No ischemia identified. Atrial fibrillation-non-gated study, no ejection fraction calculated.  Past Medical History:  Diagnosis Date   Cancer ([place])     Left breast carcinoma in situ   Cholelithiasis    Depression    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   Dysrhythmia    Afib   Heart murmur    Hx of adenomatous colonic polyps    Hyperlipidemia    Presence of permanent cardiac pacemaker     Past Surgical History:  Procedure Laterality Date   ATRIAL FLUTTER ABLATION     BIV PACEMAKER INSERTION CRT-P N/A 12/04/2021   Procedure: BIV PACEMAKER INSERTION CRT-P;  Surgeon: Vickie Epley, MD;  Location: [facility];  Service: Cardiovascular;  Laterality: N/A;   BREAST BIOPSY Left 07/30/2022    [place] LT BREAST BX W LOC DEV 1ST LESION IMG BX SPEC US GUIDE 07/30/2022 [facility] MAMMOGRAPHY   BREAST EXCISIONAL BIOPSY Left 2010   benign   CARDIOVERSION  05/2020   in [place] N/A 06/21/2021   Procedure: CARDIOVERSION;  Surgeon: Geralynn Rile, MD;  Location: [facility];  Service: Cardiovascular;  Laterality: N/A;   CARDIOVERSION N/A 03/26/2022   Procedure: CARDIOVERSION;  Surgeon: Lelon Perla, MD;  Location: [facility] ENDOSCOPY;  Service: Cardiovascular;  Laterality: N/A;   CHOLECYSTECTOMY     CHOLECYSTECTOMY, LAPAROSCOPIC  '06   [place] ARTHROSCOPY     Left '00 [place]/ Right '08   lumpectomy- remote     benign   TOOTH EXTRACTION     TOTAL KNEE ARTHROPLASTY  02/05/2011   left    MEDICATIONS:  acetaminophen (TYLENOL) 500 MG tablet   acetaminophen-codeine (TYLENOL #3) 300-30 MG tablet   amiodarone (PACERONE) 200 MG tablet   atorvastatin (LIPITOR) 20 MG tablet   Calcium Carbonate-Vit D-Min (CALTRATE 600+D PLUS MINERALS) 600-800 MG-UNIT CHEW   Cholecalciferol 50 MCG (2000 UT) TABS   ENTRESTO 24-26 MG   famotidine (PEPCID) 40 MG tablet   FLUoxetine (PROZAC) 20 MG capsule   melatonin 5 MG TABS   vitamin B-12 (CYANOCOBALAMIN) 100 MCG tablet   XARELTO 20 MG TABS tablet   No current facility-administered medications for this encounter.    Myra Gianotti, PA-C Surgical Short Stay/Anesthesiology [facility] Phone [phone] [facility] Phone [phone] 09/04/2022 3:36 PM

## 2022-09-04 NOTE — Telephone Encounter (Signed)
Patient's daughter called stating she called downstairs to schedule the chest imaging and they told her there were no orders for one. Pt's daughter would like a call back whenever the order is put in so they can head to imaging to get it done ASAP. Please advise.

## 2022-09-06 ENCOUNTER — Ambulatory Visit: Payer: Medicare Other | Attending: Cardiology | Admitting: Cardiology

## 2022-09-06 ENCOUNTER — Encounter: Payer: Self-pay | Admitting: Cardiology

## 2022-09-06 ENCOUNTER — Ambulatory Visit (HOSPITAL_BASED_OUTPATIENT_CLINIC_OR_DEPARTMENT_OTHER)
Admission: RE | Admit: 2022-09-06 | Discharge: 2022-09-06 | Disposition: A | Discharge status: Home / Self Care | Payer: Medicare Other | Source: Ambulatory Visit | Attending: Cardiology | Admitting: Cardiology

## 2022-09-06 VITALS — BP 124/72 | HR 60 | Ht 62.0 in | Wt 176.0 lb

## 2022-09-06 DIAGNOSIS — I420 Dilated cardiomyopathy: Secondary | ICD-10-CM | POA: Insufficient documentation

## 2022-09-06 DIAGNOSIS — I447 Left bundle-branch block, unspecified: Secondary | ICD-10-CM

## 2022-09-06 DIAGNOSIS — E782 Mixed hyperlipidemia: Secondary | ICD-10-CM

## 2022-09-06 DIAGNOSIS — Z950 Presence of cardiac pacemaker: Secondary | ICD-10-CM | POA: Insufficient documentation

## 2022-09-06 DIAGNOSIS — I480 Paroxysmal atrial fibrillation: Secondary | ICD-10-CM

## 2022-09-06 DIAGNOSIS — C50412 Malignant neoplasm of upper-outer quadrant of left female breast: Secondary | ICD-10-CM

## 2022-09-06 DIAGNOSIS — Z170 Estrogen receptor positive status [ER+]: Secondary | ICD-10-CM

## 2022-09-06 LAB — ECHOCARDIOGRAM COMPLETE
Area-P 1/2: 4.65 cm2
Height: 62 in
MV M vel: 4.67 m/s
MV Peak grad: 87.2 mmHg
P 1/2 time: 1347 msec
S' Lateral: 2.9 cm
Weight: 2816 oz

## 2022-09-06 NOTE — Progress Notes (Signed)
Cardiology Office Note:    Date:  09/06/2022   ID:  Sheila Schmidt, DOB 10-13-1935, MRN [id number]  PCP:  Darreld Mclean, MD  Cardiologist:  Jenne Campus, MD    Referring MD: Darreld Mclean, MD   No chief complaint on file. I v need the breast surgery  History of Present Illness:    Sheila Schmidt is a 87 y.o. female past medical history significant for paroxysmal atrial fibrillation, successfully suppressed with amiodarone, anticoagulated, history of cardiomyopathy which is nonischemic with estimation of left ventricle ejection fraction done last summer which was 35%, status post BiV pacemaker done in June 2023 she came back to me because she was find to have abnormal mammogram biopsy was done she was discovered to have breast cancer she is scheduled to have surgery actually on Tuesday next week.  Cardiac wise seems to be doing well.  Denies have any chest pain tightness squeezing pressure burning chest, she complain of being weak tired exhausted tried to walk some on my questioning about if she is able to go and do shopping she said as long as she hold to shopping cart, she can do it with some difficulties.  Past Medical History:  Diagnosis Date   Cancer ([place])     Left breast carcinoma in situ   Cholelithiasis    Depression    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   Dysrhythmia    Afib   Heart murmur    Hx of adenomatous colonic polyps    Hyperlipidemia    Presence of permanent cardiac pacemaker     Past Surgical History:  Procedure Laterality Date   ATRIAL FLUTTER ABLATION     BIV PACEMAKER INSERTION CRT-P N/A 12/04/2021   Procedure: BIV PACEMAKER INSERTION CRT-P;  Surgeon: Vickie Epley, MD;  Location: [facility];  Service: Cardiovascular;  Laterality: N/A;   BREAST BIOPSY Left 07/30/2022   [place] LT BREAST BX W LOC DEV 1ST LESION IMG BX SPEC US GUIDE 07/30/2022 [facility] MAMMOGRAPHY   BREAST EXCISIONAL BIOPSY Left 2010   benign    CARDIOVERSION  05/2020   in [place] N/A 06/21/2021   Procedure: CARDIOVERSION;  Surgeon: Geralynn Rile, MD;  Location: [facility];  Service: Cardiovascular;  Laterality: N/A;   CARDIOVERSION N/A 03/26/2022   Procedure: CARDIOVERSION;  Surgeon: Lelon Perla, MD;  Location: [facility] ENDOSCOPY;  Service: Cardiovascular;  Laterality: N/A;   CHOLECYSTECTOMY     CHOLECYSTECTOMY, LAPAROSCOPIC  '06   [place] ARTHROSCOPY     Left '00 [place]/ Right '08   lumpectomy- remote     benign   TOOTH EXTRACTION     TOTAL KNEE ARTHROPLASTY  02/05/2011   left    Current Medications: Current Meds  Medication Sig   amiodarone (PACERONE) 200 MG tablet Take 1 tablet (200 mg total) by mouth daily.   atorvastatin (LIPITOR) 20 MG tablet TAKE 1 TABLET BY MOUTH DAILY (Patient taking differently: Take 20 mg by mouth at bedtime.)   ENTRESTO 24-26 MG TAKE 1 TABLET BY MOUTH TWICE  DAILY   famotidine (PEPCID) 40 MG tablet Take 40 mg by mouth daily as needed for heartburn or indigestion.   FLUoxetine (PROZAC) 20 MG capsule TAKE 1 CAPSULE BY MOUTH DAILY   XARELTO 20 MG TABS tablet TAKE 1 TABLET BY MOUTH DAILY  WITH SUPPER     Allergies:   Patient has no known allergies.   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 [employer]. Married '59, 3 daughters- '61, '64, '73; 2 grandchildren. Exercise - goes to gym 5/wk. ACP  - directed to the [email or web address]   Lives with her daughter - goes between daughters home in [place] and her own home here   Social Determinants of Health   Financial Resource Strain: Low Risk  (02/12/2021)   Overall Financial Resource Strain (CARDIA)    Difficulty of Paying Living Expenses: Not hard at all  Food Insecurity: No Food Insecurity (02/12/2021)   Hunger Vital Sign    Worried About Running Out of Food in the Last Year: Never true    [place] in the Last Year: Never true  Transportation Needs: No Transportation Needs (02/12/2021)   PRAPARE - Hydrologist (Medical): No    Lack of Transportation (Non-Medical): No  Physical Activity: Insufficiently Active (02/12/2021)   Exercise Vital Sign    Days of Exercise per Week: 7 days    Minutes of Exercise per Session: 20 min  Stress: No Stress Concern Present (02/12/2021)   [place]    Feeling of Stress : Not at all  Social Connections: Socially Isolated (02/12/2021)   Social Connection and Isolation Panel [NHANES]    Frequency of Communication with Friends and Family: More than three times a week    Frequency of Social Gatherings with Friends and Family: More than three times a week    Attends Religious Services: Never    Marine scientist or Organizations: No    Attends Archivist Meetings: Never    Marital Status: Widowed     Family History: The patient's family history includes Diabetes in her mother; Heart attack in her mother; Heart disease in her mother and sister; Liver cancer (age of onset: 41) in her father. There is no history of Breast cancer or Colon cancer. ROS:   Please see the history of present illness.    All 14 point review of systems negative except as described per history of present illness  EKGs/Labs/Other Studies Reviewed:      Recent Labs: 10/12/2021: Magnesium 1.8 03/21/2022: TSH  2.270 06/18/2022: Pro B Natriuretic peptide (BNP) 326.0 08/08/2022: ALT 17 09/03/2022: BUN 14; Creatinine, Ser 1.10; Hemoglobin 13.6; Platelets 181; Potassium 4.3; Sodium 135  Recent Lipid Panel    Component Value Date/Time   CHOL 234 (H) 03/31/2018 1427   TRIG 89.0 03/31/2018 1427   HDL 58.20 03/31/2018 1427   CHOLHDL 4 03/31/2018 1427   VLDL 17.8 03/31/2018 1427   LDLCALC 158 (H) 03/31/2018 1427    Physical Exam:    VS:  BP 124/72 (BP Location: Right Arm, Patient Position: Sitting)   Pulse 60  Ht 5\' 2"  (1.575 m)   Wt 176 lb (79.8 kg)   SpO2 97%   BMI 32.19 kg/m     Wt Readings from Last 3 Encounters:  09/06/22 176 lb (79.8 kg)  09/03/22 178 lb 3.2 oz (80.8 kg)  08/08/22 178 lb 12.8 oz (81.1 kg)     GEN:  Well nourished, well developed in no acute distress HEENT: Normal NECK: No JVD; No carotid bruits LYMPHATICS: No lymphadenopathy CARDIAC: RRR, no murmurs, no rubs, no gallops RESPIRATORY:  Clear to auscultation without rales, wheezing or rhonchi  ABDOMEN: Soft, non-tender, non-distended MUSCULOSKELETAL:  No edema; No deformity  SKIN: Warm and dry LOWER EXTREMITIES: no swelling NEUROLOGIC:  Alert and oriented x 3 PSYCHIATRIC:  Normal affect   ASSESSMENT:    1. Dilated cardiomyopathy (HCC)   2. Paroxysmal atrial fibrillation (HCC)   3. Left bundle branch block   4. Mixed hyperlipidemia   5. Malignant neoplasm of upper-outer quadrant of left breast in female, estrogen receptor positive ([place])   6. Pacemaker Abbott device BiV    PLAN:    In order of problems listed above:  Dilated cardiomyopathy.  Last estimated ejection fraction was last summer.  I wish to have echocardiogram done before it that is before surgery, so I will schedule to have echocardiogram to be done tomorrow on Monday I do not think finding on the echocardiogram will prevent [place] from proceeding with surgery just give [place] some idea about how liberate we can be with fluids in her situation.  In the  meantime we will continue present medications.  Previously had difficulty putting her guidelines directed medical therapy because of low blood pressure.  Therefore she is only on Entresto.  But this discussion will continue hopefully will be able to add some beta-blocker in the future. Paroxysmal atrial fibrillation: Maintained sinus rhythm, continue with amiodarone goal in the future will be to reduce to 100 mg daily, I prefer not to do it right now when we talk about surgery I have a very well-known that many times people and surgical time will have recurrence of atrial fibrillation.  So after that I will reduce her amiodarone to 100 mg daily.  Continue anticoagulation instruction has been given already to her about holding anticoagulation 48 hours before surgery. Mixed dyslipidemia: I do not have any recent fasting lipid profile we will continue monitoring I will request copy of report. Pacemaker interrogation I did review, has about 6.5 to 6.8 years left in the device.  Parameters appropriate, no recurrences of atrial fibrillation Cardiovascular evaluation before breast surgery it cannot be lumpectomy with lymph node resection.  Overall because of cardiomyopathy she is at moderate risk for complication from cardiac standpoint review.  We need to be very careful and meticulous about management of fluids.  Will try to get echocardiogram before which will help [place] to determine how aggressive we can be with fluids and her.   Medication Adjustments/Labs and Tests Ordered: Current medicines are reviewed at length with the patient today.  Concerns regarding medicines are outlined above.  No orders of the defined types were placed in this encounter.  Medication changes: No orders of the defined types were placed in this encounter.   Signed, Park Liter, MD, [facility] 09/06/2022 10:43 AM    [place]

## 2022-09-06 NOTE — Patient Instructions (Signed)
Medication Instructions:  Your physician recommends that you continue on your current medications as directed. Please refer to the Current Medication list given to you today.  *If you need a refill on your cardiac medications before your next appointment, please call your pharmacy*   Lab Work: None Ordered If you have labs (blood work) drawn today and your tests are completely normal, you will receive your results only by: MyChart Message (if you have MyChart) OR A paper copy in the mail If you have any lab test that is abnormal or we need to change your treatment, we will call you to review the results.   Testing/Procedures: Your physician has requested that you have an echocardiogram. Echocardiography is a painless test that uses sound waves to create images of your heart. It provides your doctor with information about the size and shape of your heart and how well your heart's chambers and valves are working. This procedure takes approximately one hour. There are no restrictions for this procedure. Please do NOT wear cologne, perfume, aftershave, or lotions (deodorant is allowed). Please arrive 15 minutes prior to your appointment time.    Follow-Up: At [facility], you and your health needs are our priority.  As part of our continuing mission to provide you with exceptional heart care, we have created designated Provider Care Teams.  These Care Teams include your primary Cardiologist (physician) and Advanced Practice Providers (APPs -  Physician Assistants and Nurse Practitioners) who all work together to provide you with the care you need, when you need it.  We recommend signing up for the patient portal called "MyChart".  Sign up information is provided on this After Visit Summary.  MyChart is used to connect with patients for Virtual Visits (Telemedicine).  Patients are able to view lab/test results, encounter notes, upcoming appointments, etc.  Non-urgent messages can be sent to your  provider as well.   To learn more about what you can do with MyChart, go to [email or web address].com.    Your next appointment:   5 month(s)  The format for your next appointment:   In Person  Provider:   Robert Krasowski, MD    Other Instructions NA  

## 2022-09-10 ENCOUNTER — Ambulatory Visit
Admission: RE | Admit: 2022-09-10 | Discharge: 2022-09-10 | Disposition: A | Discharge status: Home / Self Care | Payer: Medicare Other | Source: Ambulatory Visit | Attending: Surgery | Admitting: Surgery

## 2022-09-10 ENCOUNTER — Encounter: Payer: Self-pay | Admitting: Family Medicine

## 2022-09-10 ENCOUNTER — Other Ambulatory Visit: Payer: Self-pay | Admitting: Surgery

## 2022-09-10 DIAGNOSIS — Z9889 Other specified postprocedural states: Secondary | ICD-10-CM

## 2022-09-10 DIAGNOSIS — C50912 Malignant neoplasm of unspecified site of left female breast: Secondary | ICD-10-CM

## 2022-09-10 HISTORY — PX: BREAST BIOPSY: SHX20

## 2022-09-11 ENCOUNTER — Other Ambulatory Visit: Payer: Self-pay

## 2022-09-11 ENCOUNTER — Encounter (HOSPITAL_COMMUNITY): Payer: Self-pay | Admitting: Surgery

## 2022-09-11 ENCOUNTER — Ambulatory Visit (HOSPITAL_COMMUNITY): Payer: Medicare Other | Admitting: Vascular Surgery

## 2022-09-11 ENCOUNTER — Ambulatory Visit (HOSPITAL_BASED_OUTPATIENT_CLINIC_OR_DEPARTMENT_OTHER): Payer: Medicare Other | Admitting: Anesthesiology

## 2022-09-11 ENCOUNTER — Ambulatory Visit (HOSPITAL_COMMUNITY)
Admission: RE | Admit: 2022-09-11 | Discharge: 2022-09-11 | Disposition: A | Discharge status: Home / Self Care | Payer: Medicare Other | Attending: Surgery | Admitting: Surgery

## 2022-09-11 ENCOUNTER — Encounter (HOSPITAL_COMMUNITY)
Admission: RE | Disposition: A | Discharge status: Home / Self Care | Payer: Self-pay | Source: Home / Self Care | Attending: Surgery

## 2022-09-11 ENCOUNTER — Ambulatory Visit
Admission: RE | Admit: 2022-09-11 | Discharge: 2022-09-11 | Disposition: A | Discharge status: Home / Self Care | Payer: Medicare Other | Source: Ambulatory Visit | Attending: Surgery | Admitting: Surgery

## 2022-09-11 DIAGNOSIS — F32A Depression, unspecified: Secondary | ICD-10-CM | POA: Insufficient documentation

## 2022-09-11 DIAGNOSIS — F419 Anxiety disorder, unspecified: Secondary | ICD-10-CM | POA: Insufficient documentation

## 2022-09-11 DIAGNOSIS — Z7901 Long term (current) use of anticoagulants: Secondary | ICD-10-CM | POA: Insufficient documentation

## 2022-09-11 DIAGNOSIS — C773 Secondary and unspecified malignant neoplasm of axilla and upper limb lymph nodes: Secondary | ICD-10-CM | POA: Diagnosis not present

## 2022-09-11 DIAGNOSIS — I10 Essential (primary) hypertension: Secondary | ICD-10-CM | POA: Diagnosis not present

## 2022-09-11 DIAGNOSIS — I351 Nonrheumatic aortic (valve) insufficiency: Secondary | ICD-10-CM

## 2022-09-11 DIAGNOSIS — I4891 Unspecified atrial fibrillation: Secondary | ICD-10-CM | POA: Insufficient documentation

## 2022-09-11 DIAGNOSIS — C50412 Malignant neoplasm of upper-outer quadrant of left female breast: Secondary | ICD-10-CM

## 2022-09-11 DIAGNOSIS — C50912 Malignant neoplasm of unspecified site of left female breast: Secondary | ICD-10-CM

## 2022-09-11 DIAGNOSIS — Z950 Presence of cardiac pacemaker: Secondary | ICD-10-CM | POA: Insufficient documentation

## 2022-09-11 DIAGNOSIS — M1990 Unspecified osteoarthritis, unspecified site: Secondary | ICD-10-CM | POA: Diagnosis not present

## 2022-09-11 DIAGNOSIS — Z170 Estrogen receptor positive status [ER+]: Secondary | ICD-10-CM | POA: Diagnosis not present

## 2022-09-11 HISTORY — PX: RADIOACTIVE SEED GUIDED AXILLARY SENTINEL LYMPH NODE: SHX6735

## 2022-09-11 HISTORY — PX: BREAST LUMPECTOMY WITH RADIOACTIVE SEED AND SENTINEL LYMPH NODE BIOPSY: SHX6550

## 2022-09-11 SURGERY — BREAST LUMPECTOMY WITH RADIOACTIVE SEED AND SENTINEL LYMPH NODE BIOPSY
Anesthesia: General | Site: Breast | Laterality: Left

## 2022-09-11 MED ORDER — CEFAZOLIN IN SODIUM CHLORIDE 3-0.9 GM/100ML-% IV SOLN
3.0000 g | INTRAVENOUS | Status: DC
  Filled 2022-09-11: qty 100

## 2022-09-11 MED ORDER — ACETAMINOPHEN 500 MG PO TABS
1000.0000 mg | ORAL_TABLET | ORAL | Status: AC
  Administered 2022-09-11: 1000 mg via ORAL
  Filled 2022-09-11: qty 2

## 2022-09-11 MED ORDER — CHLORHEXIDINE GLUCONATE CLOTH 2 % EX PADS: 6.0000 | MEDICATED_PAD | Freq: Once | CUTANEOUS | Status: DC

## 2022-09-11 MED ORDER — BUPIVACAINE HCL (PF) 0.5 % IJ SOLN
INTRAMUSCULAR | Status: DC | PRN
  Administered 2022-09-11: 15 mL

## 2022-09-11 MED ORDER — CEFAZOLIN SODIUM-DEXTROSE 2-3 GM-%(50ML) IV SOLR
INTRAVENOUS | Status: DC | PRN
  Administered 2022-09-11: 2 g via INTRAVENOUS

## 2022-09-11 MED ORDER — PHENYLEPHRINE HCL-NACL 20-0.9 MG/250ML-% IV SOLN
INTRAVENOUS | Status: AC
  Filled 2022-09-11: qty 250

## 2022-09-11 MED ORDER — ONDANSETRON HCL 4 MG/2ML IJ SOLN
INTRAMUSCULAR | Status: DC | PRN
  Administered 2022-09-11: 4 mg via INTRAVENOUS

## 2022-09-11 MED ORDER — PHENYLEPHRINE HCL-NACL 20-0.9 MG/250ML-% IV SOLN
INTRAVENOUS | Status: DC | PRN
  Administered 2022-09-11: 30 ug/min via INTRAVENOUS

## 2022-09-11 MED ORDER — PROPOFOL 10 MG/ML IV BOLUS
INTRAVENOUS | Status: DC | PRN
  Administered 2022-09-11: 150 mg via INTRAVENOUS

## 2022-09-11 MED ORDER — PHENYLEPHRINE 80 MCG/ML (10ML) SYRINGE FOR IV PUSH (FOR BLOOD PRESSURE SUPPORT)
PREFILLED_SYRINGE | INTRAVENOUS | Status: DC | PRN
  Administered 2022-09-11 (×3): 80 ug via INTRAVENOUS

## 2022-09-11 MED ORDER — ONDANSETRON HCL 4 MG/2ML IJ SOLN: 4.0000 mg | Freq: Once | INTRAMUSCULAR | Status: DC | PRN

## 2022-09-11 MED ORDER — ACETAMINOPHEN 10 MG/ML IV SOLN: 1000.0000 mg | Freq: Once | INTRAVENOUS | Status: DC | PRN

## 2022-09-11 MED ORDER — MAGTRACE LYMPHATIC TRACER
INTRAMUSCULAR | Status: DC | PRN
  Administered 2022-09-11: 2 mL via INTRAMUSCULAR

## 2022-09-11 MED ORDER — PROPOFOL 10 MG/ML IV BOLUS
INTRAVENOUS | Status: AC
  Filled 2022-09-11: qty 20

## 2022-09-11 MED ORDER — OXYCODONE HCL 5 MG PO TABS
5.0000 mg | ORAL_TABLET | Freq: Four times a day (QID) | ORAL | 0 refills | Status: DC | PRN
Start: 2022-09-11 — End: 2022-10-03

## 2022-09-11 MED ORDER — FENTANYL CITRATE (PF) 100 MCG/2ML IJ SOLN: 25.0000 ug | INTRAMUSCULAR | Status: DC | PRN

## 2022-09-11 MED ORDER — CHLORHEXIDINE GLUCONATE 0.12 % MT SOLN
15.0000 mL | Freq: Once | OROMUCOSAL | Status: AC
  Administered 2022-09-11: 15 mL via OROMUCOSAL
  Filled 2022-09-11: qty 15

## 2022-09-11 MED ORDER — BUPIVACAINE-EPINEPHRINE 0.25% -1:200000 IJ SOLN
INTRAMUSCULAR | Status: DC | PRN
  Administered 2022-09-11: 30 mL

## 2022-09-11 MED ORDER — DEXAMETHASONE SODIUM PHOSPHATE 10 MG/ML IJ SOLN
INTRAMUSCULAR | Status: DC | PRN
  Administered 2022-09-11: 5 mg via INTRAVENOUS

## 2022-09-11 MED ORDER — BUPIVACAINE LIPOSOME 1.3 % IJ SUSP
INTRAMUSCULAR | Status: AC
  Filled 2022-09-11: qty 10

## 2022-09-11 MED ORDER — BUPIVACAINE LIPOSOME 1.3 % IJ SUSP
INTRAMUSCULAR | Status: DC | PRN
  Administered 2022-09-11: 10 mL

## 2022-09-11 MED ORDER — ACETAMINOPHEN 500 MG PO TABS
1000.0000 mg | ORAL_TABLET | ORAL | Status: DC
Start: 2022-09-11 — End: 2022-09-11

## 2022-09-11 MED ORDER — LACTATED RINGERS IV SOLN: INTRAVENOUS | Status: DC

## 2022-09-11 MED ORDER — FENTANYL CITRATE (PF) 250 MCG/5ML IJ SOLN
INTRAMUSCULAR | Status: AC
  Filled 2022-09-11: qty 5

## 2022-09-11 MED ORDER — LIDOCAINE 2% (20 MG/ML) 5 ML SYRINGE
INTRAMUSCULAR | Status: DC | PRN
  Administered 2022-09-11: 20 mg via INTRAVENOUS

## 2022-09-11 MED ORDER — 0.9 % SODIUM CHLORIDE (POUR BTL) OPTIME
TOPICAL | Status: DC | PRN
  Administered 2022-09-11: 1000 mL

## 2022-09-11 MED ORDER — FENTANYL CITRATE (PF) 250 MCG/5ML IJ SOLN
INTRAMUSCULAR | Status: DC | PRN
  Administered 2022-09-11 (×3): 25 ug via INTRAVENOUS

## 2022-09-11 MED ORDER — BUPIVACAINE-EPINEPHRINE (PF) 0.25% -1:200000 IJ SOLN
INTRAMUSCULAR | Status: AC
  Filled 2022-09-11: qty 30

## 2022-09-11 MED ORDER — ORAL CARE MOUTH RINSE: 15.0000 mL | Freq: Once | OROMUCOSAL | Status: AC

## 2022-09-11 MED ORDER — LIDOCAINE 2% (20 MG/ML) 5 ML SYRINGE
INTRAMUSCULAR | Status: AC
  Filled 2022-09-11: qty 5

## 2022-09-11 MED ORDER — CEFAZOLIN SODIUM-DEXTROSE 2-4 GM/100ML-% IV SOLN
INTRAVENOUS | Status: AC
  Filled 2022-09-11: qty 100

## 2022-09-11 SURGICAL SUPPLY — 49 items
ADH SKN CLS APL DERMABOND .7 (GAUZE/BANDAGES/DRESSINGS) ×3
APL PRP STRL LF DISP 70% ISPRP (MISCELLANEOUS) ×3
APPLIER CLIP 9.375 MED OPEN (MISCELLANEOUS) ×3
APR CLP MED 9.3 20 MLT OPN (MISCELLANEOUS) ×3
BAG COUNTER SPONGE SURGICOUNT (BAG) IMPLANT
BAG SPNG CNTER NS LX DISP (BAG)
BINDER BREAST LRG (GAUZE/BANDAGES/DRESSINGS) IMPLANT
BINDER BREAST XLRG (GAUZE/BANDAGES/DRESSINGS) IMPLANT
CANISTER SUCT 3000ML PPV (MISCELLANEOUS) ×3 IMPLANT
CHLORAPREP W/TINT 26 (MISCELLANEOUS) ×3 IMPLANT
CLIP APPLIE 9.375 MED OPEN (MISCELLANEOUS) ×3 IMPLANT
CNTNR URN SCR LID CUP LEK RST (MISCELLANEOUS) ×3 IMPLANT
CONT SPEC 4OZ STRL OR WHT (MISCELLANEOUS) ×12
COVER PROBE CYLINDRICAL 5X96 (MISCELLANEOUS) IMPLANT
COVER PROBE W GEL 5X96 (DRAPES) ×6 IMPLANT
COVER SURGICAL LIGHT HANDLE (MISCELLANEOUS) ×3 IMPLANT
DERMABOND ADVANCED .7 DNX12 (GAUZE/BANDAGES/DRESSINGS) ×3 IMPLANT
DEVICE DUBIN SPECIMEN MAMMOGRA (MISCELLANEOUS) ×3 IMPLANT
DRAPE CHEST BREAST 15X10 FENES (DRAPES) ×3 IMPLANT
ELECT CAUTERY BLADE 6.4 (BLADE) ×3 IMPLANT
ELECT REM PT RETURN 9FT ADLT (ELECTROSURGICAL) ×3
ELECTRODE REM PT RTRN 9FT ADLT (ELECTROSURGICAL) ×3 IMPLANT
GAUZE PAD ABD 8X10 STRL (GAUZE/BANDAGES/DRESSINGS) IMPLANT
GAUZE SPONGE 4X4 12PLY STRL (GAUZE/BANDAGES/DRESSINGS) IMPLANT
GLOVE BIO SURGEON STRL SZ8 (GLOVE) ×3 IMPLANT
GLOVE BIOGEL PI IND STRL 8 (GLOVE) ×3 IMPLANT
GOWN STRL REUS W/ TWL LRG LVL3 (GOWN DISPOSABLE) ×3 IMPLANT
GOWN STRL REUS W/ TWL XL LVL3 (GOWN DISPOSABLE) ×3 IMPLANT
GOWN STRL REUS W/TWL LRG LVL3 (GOWN DISPOSABLE) ×3
GOWN STRL REUS W/TWL XL LVL3 (GOWN DISPOSABLE) ×3
KIT BASIN OR (CUSTOM PROCEDURE TRAY) ×3 IMPLANT
KIT MARKER MARGIN INK (KITS) ×3 IMPLANT
LIGHT WAVEGUIDE WIDE FLAT (MISCELLANEOUS) IMPLANT
NDL 18GX1X1/2 (RX/OR ONLY) (NEEDLE) IMPLANT
NDL FILTER BLUNT 18X1 1/2 (NEEDLE) IMPLANT
NDL HYPO 18GX1.5 BLUNT FILL (NEEDLE) IMPLANT
NDL HYPO 25GX1X1/2 BEV (NEEDLE) ×3 IMPLANT
NEEDLE 18GX1X1/2 (RX/OR ONLY) (NEEDLE) IMPLANT
NEEDLE FILTER BLUNT 18X1 1/2 (NEEDLE) IMPLANT
NEEDLE HYPO 18GX1.5 BLUNT FILL (NEEDLE) ×3 IMPLANT
NEEDLE HYPO 25GX1X1/2 BEV (NEEDLE) ×3 IMPLANT
NS IRRIG 1000ML POUR BTL (IV SOLUTION) ×3 IMPLANT
PACK GENERAL/GYN (CUSTOM PROCEDURE TRAY) ×3 IMPLANT
SUT MNCRL AB 4-0 PS2 18 (SUTURE) ×3 IMPLANT
SUT VIC AB 3-0 SH 18 (SUTURE) ×3 IMPLANT
SYR CONTROL 10ML LL (SYRINGE) ×3 IMPLANT
TOWEL GREEN STERILE (TOWEL DISPOSABLE) ×3 IMPLANT
TOWEL GREEN STERILE FF (TOWEL DISPOSABLE) ×3 IMPLANT
TRACER MAGTRACE VIAL (MISCELLANEOUS) IMPLANT

## 2022-09-11 NOTE — Anesthesia Procedure Notes (Signed)
Anesthesia Regional Block: Pectoralis block   Pre-Anesthetic Checklist: , timeout performed,  Correct Patient, Correct Site, Correct Laterality,  Correct Procedure, Correct Position, site marked,  Risks and benefits discussed,  Surgical consent,  Pre-op evaluation,  At surgeon's request and post-op pain management  Laterality: Left and Upper  Prep: chloraprep       Needles:  Injection technique: Single-shot  Needle Type: Echogenic Needle     Needle Length: 9cm  Needle Gauge: 21     Additional Needles:   Procedures:,,,, ultrasound used (permanent image in chart),,    Narrative:  Start time: 09/11/2022 6:59 AM End time: 09/11/2022 7:08 AM Injection made incrementally with aspirations every 5 mL.  Performed by: Personally  Anesthesiologist: Barnet Glasgow, MD  Additional Notes: Block assessed. Patient tolerated procedure well.

## 2022-09-11 NOTE — Discharge Instructions (Signed)
[facility] Office Phone Number [phone]  BREAST BIOPSY/ PARTIAL MASTECTOMY: POST OP INSTRUCTIONS  Always review your discharge instruction sheet given to you by the facility where your surgery was performed.  IF YOU HAVE DISABILITY OR FAMILY LEAVE FORMS, YOU MUST BRING THEM TO THE OFFICE FOR PROCESSING.  DO NOT GIVE THEM TO YOUR DOCTOR.  A prescription for pain medication may be given to you upon discharge.  Take your pain medication as prescribed, if needed.  If narcotic pain medicine is not needed, then you may take acetaminophen (Tylenol) or ibuprofen (Advil) as needed. Take your usually prescribed medications unless otherwise directed If you need a refill on your pain medication, please contact your pharmacy.  They will contact our office to request authorization.  Prescriptions will not be filled after 5pm or on week-ends. You should eat very light the first 24 hours after surgery, such as soup, crackers, pudding, etc.  Resume your normal diet the day after surgery. Most patients will experience some swelling and bruising in the breast.  Ice packs and a good support bra will help.  Swelling and bruising can take several days to resolve.  It is common to experience some constipation if taking pain medication after surgery.  Increasing fluid intake and taking a stool softener will usually help or prevent this problem from occurring.  A mild laxative (Milk of Magnesia or Miralax) should be taken according to package directions if there are no bowel movements after 48 hours. Unless discharge instructions indicate otherwise, you may remove your bandages 24-48 hours after surgery, and you may shower at that time.  You may have steri-strips (small skin tapes) in place directly over the incision.  These strips should be left on the skin for 7-10 days.  If your surgeon used skin glue on the incision, you may shower in 24 hours.  The glue will flake off over the next 2-3 weeks.  Any  sutures or staples will be removed at the office during your follow-up visit. ACTIVITIES:  You may resume regular daily activities (gradually increasing) beginning the next day.  Wearing a good support bra or sports bra minimizes pain and swelling.  You may have sexual intercourse when it is comfortable. You may drive when you no longer are taking prescription pain medication, you can comfortably wear a seatbelt, and you can safely maneuver your car and apply brakes. RETURN TO WORK:  ______________________________________________________________________________________ You should see your doctor in the office for a follow-up appointment approximately two weeks after your surgery.  Your doctor's nurse will typically make your follow-up appointment when she calls you with your pathology report.  Expect your pathology report 2-3 business days after your surgery.  You may call to check if you do not hear from us after three days. OTHER INSTRUCTIONS: _______________________________________________________________________________________________ _____________________________________________________________________________________________________________________________________ _____________________________________________________________________________________________________________________________________ _____________________________________________________________________________________________________________________________________  WHEN TO CALL YOUR DOCTOR: Fever over 101.0 Nausea and/or vomiting. Extreme swelling or bruising. Continued bleeding from incision. Increased pain, redness, or drainage from the incision.  The clinic staff is available to answer your questions during regular business hours.  Please don't hesitate to call and ask to speak to one of the nurses for clinical concerns.  If you have a medical emergency, go to the nearest emergency room or call 911.  A surgeon from [facility] is always on call at the hospital.  For further questions, please visit [email or web address]   

## 2022-09-11 NOTE — H&P (Signed)
Chief Complaint: Breast Cancer  History of Present Illness: Sheila Schmidt is a 87 y.o. female who is seen today as an office consultation for evaluation of Breast Cancer  Pt presents to the [facility] ER POS PR POS HER 2 NEU NEGATIVE  Had breast cancer on the left 2010 treated with lumpectomy and SLN mapping  No radiation   She has a 0.7 cm left breast mass UOQ and a positive node on core bx   Has a pacemaker and significant cardiac issues   No CP sob  No breast pain or mass noted   Review of Systems: A complete review of systems was obtained from the patient. I have reviewed this information and discussed as appropriate with the patient. See HPI as well for other ROS.    Medical History: Past Medical History:  Diagnosis Date  Anxiety  Arrhythmia  Arthritis  GERD (gastroesophageal reflux disease)  History of cancer  Hyperlipidemia  Pacemaker   There is no problem list on file for this patient.  Past Surgical History:  Procedure Laterality Date  MASTECTOMY PARTIAL / LUMPECTOMY Left 2010  JOINT REPLACEMENT Left 2012  CARDIOVERSION INT 2023  CHOLECYSTECTOMY  IMPLANT ICD    No Known Allergies  Current Outpatient Medications on File Prior to Visit  Medication Sig Dispense Refill  AMIOdarone (PACERONE) 200 MG tablet Take by mouth  atorvastatin (LIPITOR) 20 MG tablet  famotidine (PEPCID) 40 MG tablet Take by mouth  FLUoxetine (PROZAC) 20 MG capsule Take 1 capsule by mouth once daily  rivaroxaban (XARELTO) 20 mg tablet Take 1 tablet every day by oral route.  sacubitriL-valsartan (ENTRESTO) 24-26 mg tablet Take 1 tablet by mouth 2 (two) times daily  acetaminophen (TYLENOL) 500 MG tablet Take by mouth  cholecalciferol (VITAMIN D3) 1000 unit capsule Take by oral route.   No current facility-administered medications on file prior to visit.   Family History  Problem Relation Age of Onset  Coronary Artery Disease (Blocked arteries around  heart) Mother  Coronary Artery Disease (Blocked arteries around heart) Sister    Social History   Tobacco Use  Smoking Status Never  Smokeless Tobacco Never    Social History   Socioeconomic History  Marital status: Widowed  Tobacco Use  Smoking status: Never  Smokeless tobacco: Never  Vaping Use  Vaping Use: Never used  Substance and Sexual Activity  Alcohol use: Yes  Drug use: Not Currently   Objective:  There were no vitals filed for this visit.  There is no height or weight on file to calculate BMI.  Physical Exam Exam conducted with a chaperone present.  HENT:  Head: Normocephalic.  Cardiovascular:  Rate and Rhythm: Normal rate.  Pulmonary:  Effort: Pulmonary effort is normal.  Chest:  Breasts: Right: Normal.   Comments: Scars left breast no mass Pacemaker left  Musculoskeletal:  General: Normal range of motion.  Lymphadenopathy:  Upper Body:  Right upper body: No supraclavicular or axillary adenopathy.  Left upper body: Axillary adenopathy present. No supraclavicular adenopathy.  Skin: General: Skin is warm.  Neurological:  General: No focal deficit present.  Mental Status: She is alert.     Labs, Imaging and Diagnostic Testing: See above   Assessment and Plan:   Diagnoses and all orders for this visit:  Breast cancer, stage 2, left (CMS-HCC)   Pt does not want radiation therapy and understands increase recurrence rates  She does not want a mastectomy   Plan for left lumpectomy ,  targeted left axillary lymph node and SLN mapping   Seen by radiation oncology as well  The procedure has been discussed with the patient. Alternatives to surgery have been discussed with the patient. Risks of surgery include bleeding, Infection, Seroma formation, death, and the need for further surgery. The patient understands and wishes to proceed.    Kennieth Francois, MD

## 2022-09-11 NOTE — Anesthesia Procedure Notes (Signed)
Procedure Name: LMA Insertion Date/Time: 09/11/2022 7:42 AM  Performed by: Colin Benton, CRNAPre-anesthesia Checklist: Patient identified, Emergency Drugs available, Suction available and Patient being monitored Patient Re-evaluated:Patient Re-evaluated prior to induction Oxygen Delivery Method: Circle System Utilized Preoxygenation: Pre-oxygenation with 100% oxygen Induction Type: IV induction Ventilation: Mask ventilation without difficulty LMA: LMA inserted LMA Size: 4.0 Number of attempts: 1 Placement Confirmation: positive ETCO2 Tube secured with: Tape Dental Injury: Teeth and Oropharynx as per pre-operative assessment  Comments: Insertion by Elson Areas SRNA

## 2022-09-11 NOTE — Anesthesia Postprocedure Evaluation (Signed)
Anesthesia Post Note  Patient: Sheila Schmidt  Procedure(s) Performed: LEFT BREAST LUMPECTOMY WITH RADIOACTIVE SEED (Left: Breast) RADIOACTIVE SEED GUIDED LEFT SENTINEL LYMPH NODE BIOPSY (Left) SENTINEL LYMPH NODE INJECTION AND MAPPING (Left: Axilla)     Patient location during evaluation: PACU Anesthesia Type: General and Regional Level of consciousness: awake and alert Pain management: pain level controlled Vital Signs Assessment: post-procedure vital signs reviewed and stable Respiratory status: spontaneous breathing, nonlabored ventilation, respiratory function stable and patient connected to nasal cannula oxygen Cardiovascular status: blood pressure returned to baseline and stable Postop Assessment: no apparent nausea or vomiting Anesthetic complications: no  No notable events documented.  Last Vitals:  Vitals:   09/11/22 0915 09/11/22 0930  BP: (!) 107/58 (!) 114/58  Pulse: 60 60  Resp: 15 16  Temp:  36.5 C  SpO2: 91% 93%    Last Pain:  Vitals:   09/11/22 0930  TempSrc:   PainSc: 0-No pain   Pain Goal:                   Barnet Glasgow

## 2022-09-11 NOTE — Op Note (Signed)
Preoperative diagnosis: Stage II left breast cancer upper outer quadrant  Postoperative diagnosis: Same  Procedure: Left breast seed localized lumpectomy, deep left axillary targeted lymph node biopsy with seed localization, deep left axillary sentinel lymph node mapping using mag trace  Surgeon: Erroll Luna, MD  Anesthesia: LMA with left pectoral block and 0.25% Marcaine with epinephrine  EBL: Minimal  Specimen: Left breast tissue with seed and clip verified by Faxitron, additional margin sent except for anterior and posterior which were skin and muscle, left extra lymph node with seed, 1 additional targeted left axillary sentinel lymph node  Drains: None  Indications for procedure: The patient is a 39 female with stage II left breast cancer.  She presents today for breast conserving surgery as a treatment option.  She was seen in the [facility] clinic and options were presented.The procedure has been discussed with the patient. Alternatives to surgery have been discussed with the patient.  Risks of surgery include bleeding,  Infection,  Seroma formation, death,  and the need for further surgery.   The patient understands and wishes to proceed. Sentinel lymph node mapping and dissection has been discussed with the patient.  Risk of bleeding,  Infection,  Seroma formation,  Additional procedures,,  Shoulder weakness , lymphedema, seed use, shoulder stiffness,  Nerve and blood vessel injury and reaction to the mapping dyes have been discussed.  Alternatives to surgery have been discussed with the patient.  The patient agrees to proceed.   Description of procedure: The patient was met in the holding area and questions were answered.  Left breast was marked as correct site and a pectoral block was placed by anesthesia.  Of note seeds were placed as an outpatient.  She was then taken back to the operative room.  She was placed supine upon the OR table.  After induction of general esthesia, 2 cc of MAC  tracer injected in the subareolar plexus after sterile prep.  This was allowed to circulate for 5 minutes.  A magnet was placed on her pacemaker.  Her left breast was then prepped and draped in a sterile fashion prepping the magnet out.  Timeout performed.  Proper patient, site and procedure verified.  Neoprobe used to identify the seed in the left breast.  Curvilinear incision was made over the signal.  Dissection was carried down all tissue and the seed and clip were excised with grossly negative margins.  I took additional margins except for the anterior and posterior margins which were skin and muscle respectively.  These were all oriented with ink.  The image revealed both seed and clip to be present.  This cavity is irrigated.  Local anesthetic infiltrated and closed the deep layer 3-0 Vicryl.  4 Monocryl was used to close the skin.  Of note clips were placed.  Neoprobe used to identify the seed in the left axilla.  A transverse incision was made along the inferior border of the axillary hairline.  Dissection was carried down to the deep axillary contents.  The seed and node were identified and removed.  There were 2 additional nodes in the fatty tissue that showed up with the mag trace.  There were no other nodes that were abnormal in the axilla.  The long thoracic nerve, thoracodorsal trunk and axillary vein were all preserved.  Irrigation was used and hemostasis achieved.  Deep tissue layers were then approximated with 3-0 Vicryl.  4 Monocryl was used to close skin in a subcuticular fashion.  Dermabond applied.  All  counts found to be correct.  Breast binder placed.  The patient was awoke extubated taken to recovery in satisfactory condition.

## 2022-09-11 NOTE — Transfer of Care (Signed)
Immediate Anesthesia Transfer of Care Note  Patient: Sheila Schmidt  Procedure(s) Performed: LEFT BREAST LUMPECTOMY WITH RADIOACTIVE SEED (Left: Breast) RADIOACTIVE SEED GUIDED LEFT SENTINEL LYMPH NODE BIOPSY (Left) SENTINEL LYMPH NODE INJECTION AND MAPPING (Left: Axilla)  Patient Location: PACU  Anesthesia Type:GA combined with regional for post-op pain  Level of Consciousness: awake, alert , oriented, and patient cooperative  Airway & Oxygen Therapy: Patient Spontanous Breathing  Post-op Assessment: Report given to RN and Post -op Vital signs reviewed and stable  Post vital signs: Reviewed and stable  Last Vitals:  Vitals Value Taken Time  BP 137/62 09/11/22 0900  Temp    Pulse 60 09/11/22 0902  Resp 19 09/11/22 0902  SpO2 90 % 09/11/22 0902  Vitals shown include unvalidated device data.  Last Pain:  Vitals:   09/11/22 0619  TempSrc: Oral  PainSc:          Complications: No notable events documented.

## 2022-09-11 NOTE — Interval H&P Note (Signed)
History and Physical Interval Note:  09/11/2022 6:50 AM  Sheila Schmidt  has presented today for surgery, with the diagnosis of LEFT BREAST CANCER.  The various methods of treatment have been discussed with the patient and family. After consideration of risks, benefits and other options for treatment, the patient has consented to  Procedure(s): LEFT BREAST LUMPECTOMY WITH RADIOACTIVE SEED AND SENTINEL LYMPH NODE BIOPSY (Left) RADIOACTIVE SEED GUIDED LEFT SENTINEL LYMPH NODE BIOPSY (Left) as a surgical intervention.  The patient's history has been reviewed, patient examined, no change in status, stable for surgery.  I have reviewed the patient's chart and labs.  Questions were answered to the patient's satisfaction.     [place]

## 2022-09-12 ENCOUNTER — Encounter (HOSPITAL_COMMUNITY): Payer: Self-pay | Admitting: Surgery

## 2022-09-12 LAB — SURGICAL PATHOLOGY

## 2022-09-13 ENCOUNTER — Encounter: Payer: Self-pay | Admitting: Surgery

## 2022-09-14 ENCOUNTER — Telehealth: Payer: Self-pay

## 2022-09-14 NOTE — Telephone Encounter (Signed)
LVM regarding ECHO results.  

## 2022-09-17 ENCOUNTER — Telehealth: Payer: Self-pay

## 2022-09-17 NOTE — Telephone Encounter (Signed)
LVM to call regarding Echo results

## 2022-09-18 ENCOUNTER — Encounter: Payer: Self-pay | Admitting: *Deleted

## 2022-09-18 ENCOUNTER — Ambulatory Visit: Payer: Self-pay | Admitting: Genetic Counselor

## 2022-09-18 ENCOUNTER — Encounter: Payer: Self-pay | Admitting: Genetic Counselor

## 2022-09-18 ENCOUNTER — Telehealth: Payer: Self-pay

## 2022-09-18 ENCOUNTER — Telehealth: Payer: Self-pay | Admitting: Genetic Counselor

## 2022-09-18 DIAGNOSIS — Z1379 Encounter for other screening for genetic and chromosomal anomalies: Secondary | ICD-10-CM | POA: Insufficient documentation

## 2022-09-18 DIAGNOSIS — C50412 Malignant neoplasm of upper-outer quadrant of left female breast: Secondary | ICD-10-CM

## 2022-09-18 NOTE — Telephone Encounter (Signed)
Patient notified of results.

## 2022-09-18 NOTE — Telephone Encounter (Signed)
Revealed negative genetics and VUS in PMS2/POLE

## 2022-09-18 NOTE — Telephone Encounter (Signed)
-----   Message from Georgeanna Lea, MD sent at 09/07/2022 12:44 PM EDT ----- Echocardiogram showed preserved left ventricle ejection fraction, mild mitral valve regurgitation enlarged both atria overall looks good some diastolic dysfunction meaning heart is somewhat stiff that happens many times with aging

## 2022-09-21 ENCOUNTER — Encounter (INDEPENDENT_AMBULATORY_CARE_PROVIDER_SITE_OTHER): Payer: Medicare Other | Admitting: Family Medicine

## 2022-09-21 DIAGNOSIS — R197 Diarrhea, unspecified: Secondary | ICD-10-CM

## 2022-09-21 NOTE — Telephone Encounter (Signed)
Please see the MyChart message reply(ies) for my assessment and plan.  The patient gave consent for this Medical Advice Message and is aware that it may result in a bill to their insurance company as well as the possibility that this may result in a co-payment or deductible. They are an established patient, but are not seeking medical advice exclusively about a problem treated during an in person or video visit in the last 7 days. I did not recommend an in person or video visit within 7 days of my reply.  I spent a total of 10 minutes cumulative time within 7 days through MyChart messaging Embry Huss, MD  

## 2022-09-24 ENCOUNTER — Inpatient Hospital Stay: Payer: Medicare Other | Admitting: Hematology

## 2022-09-24 ENCOUNTER — Telehealth: Payer: Self-pay | Admitting: *Deleted

## 2022-09-24 ENCOUNTER — Encounter: Payer: Self-pay | Admitting: *Deleted

## 2022-09-24 ENCOUNTER — Telehealth: Payer: Self-pay | Admitting: Hematology

## 2022-09-24 NOTE — Progress Notes (Signed)
Location of Breast Cancer: Malignant neoplasm of upper-outer quadrant of left breast in female, estrogen receptor positive   Histology per Pathology Report:  09-11-22 FINAL MICROSCOPIC DIAGNOSIS:  A. BREAST, LEFT, LUMPECTOMY: Invasive ductal carcinoma, 0.8 cm, grade II/III Ductal carcinoma in situ: Not identified Margins, invasive: Negative     Closest, invasive: Posterior at 6 mm Margins, DCIS: N/A     Closest, DCIS: N/A Lymphovascular invasion: Suspicious for small arteriolar invasion Prognostic markers:  ER positive, PR positive, Her2 negative, Ki-67 20% % Other: N/A See oncology table  B. LYMPH NODE, LEFT AXILLA, SENTINEL, EXCISION: -  1 lymph node positive for malignancy, 18 mm in greatest dimension, with extracapsular extension (1/1).  C. LYMPH NODE, LEFT AXILLA, SENTINEL, EXCISION: -  1 lymph node positive for malignancy, 4 mm in greatest dimension (1/1).  D. LYMPH NODE, LEFT AXILLA, SENTINEL, EXCISION: -  1 lymph node negative for malignancy (0/1).  E. BREAST, LEFT ADDITIONAL MEDIAL MARGIN, EXCISION: -  Benign breast tissue, negative for malignancy. -  New additional margin thickness 7 mm.  F. BREAST, LEFT ADDITIONAL SUPERIOR MARGIN, EXCISION: -  Benign breast tissue, negative for malignancy. -  New additional margin thickness 13 mm.  G. BREAST, LEFT ADDITIONAL INFERIOR MARGIN, EXCISION: -  Benign breast tissue, negative for malignancy. -  New additional margin thickness 6 mm  H. BREAST, LEFT ADDITIONAL LATERAL MARGIN, EXCISION: -  Benign breast tissue, negative for malignancy. -  New additional margin thickness 5 mm.  ONCOLOGY TABLE:  INVASIVE CARCINOMA OF THE BREAST:  Resection  Procedure: Lumpectomy Specimen Laterality: Left Histologic Type: Invasive ductal carcinoma (NOS)/invasive mammary carcinoma, NST Histologic Grade:      Glandular (Acinar)/Tubular Differentiation: 2/3      Nuclear Pleomorphism: 2/3      Mitotic Rate: 2/3      Overall Grade:  II/III Tumor Size: 8 x 8 x 7 mm Ductal Carcinoma In Situ: Not identified Lymphatic and/or Vascular Invasion: Suspicious for small arterial invasion involvement Treatment Effect in the Breast: No known presurgical therapy Margins: All margins negative for invasive carcinoma      Distance from Closest Margin (mm): 6      Specify Closest Margin (required only if <41mm): Posterior DCIS Margins: N/A      Distance from Closest Margin (mm): N/A      Specify Closest Margin (required only if <30mm): N/A Regional Lymph Nodes:      Number of Lymph Nodes Examined: 3      Number of Sentinel Nodes Examined: 3      Number of Lymph Nodes with Macrometastases (>2 mm): 2      Number of Lymph Nodes with Micrometastases: 0      Number of Lymph Nodes with Isolated Tumor Cells (=0.2 mm or =200 cells): 0      Size of Largest Metastatic Deposit (mm): 18      Extranodal Extension: Present Distant Metastasis:      Distant Site(s) Involved: N/A Breast Biomarker Testing Performed on Previous Biopsy:      Testing Performed on Case Number: [id number]            Estrogen Receptor: Positive, 100% strong            Progesterone Receptor: Positive, 50% strong            HER2: Negative, 0 of 3            Ki-67: 20% Pathologic Stage Classification (pTNM, AJCC 8th Edition): pT1b, pN1 Representative Tumor Block: A4  Comment(s): None (v4.5.0.0)  07-30-22   Receptor Status: ER(100%), PR (50%), Her2-neu (neg), Ki-67(20%)  Did patient present with symptoms (if so, please note symptoms) or was this found on screening mammography?: screening mammogram  Past/Anticipated interventions by surgeon, if any: 09-11-22 Dr. Luisa Hart History of Present Illness: Sheila Schmidt is a 87 y.o. female who is seen today as an office consultation for evaluation of Breast Cancer  Pt [facility] to the [facility] ST[facility] 2 [facility] BREAST CANCER [facility] ER POS PR POS HER 2 NEU NEGATIVE  Had breast cancer on the left 2010 treated with lumpectomy and SLN  mapping  No radiation   She has a 0.7 cm left breast mass UOQ and a positive node on core bx   Has a pacemaker and significant cardiac issues   Procedure: Left breast seed localized lumpectomy, deep left axillary targeted lymph node biopsy with seed localization, deep left axillary sentinel lymph node mapping using mag trace   Assessment and Plan:   Diagnoses and all orders for this visit:  Breast cancer, stage 2, left (CMS-HCC)  Pt does not want radiation therapy and understands increase recurrence rates  She does not want a mastectomy   Plan for left lumpectomy , targeted left axillary lymph node and SLN mapping   Seen by radiation oncology as well   Past/Anticipated interventions by medical oncology, if any:  08-08-22 Dr. Mosetta Putt HISTORY OF PRESENTING ILLNESS:  Sheila Schmidt 87 y.o. female with past medical history of left breast cancer in 2010, is here because of her newly diagnosed left breast cancer.  She is accompanied by her daughter to the clinic today.   She had a stage I left breast cancer diagnosed in 2014, status post lumpectomy, and 5 years of tamoxifen.  She has been doing annual screening mammograms since then.  She denies any palpable mass before the most recent mammogram.  Due to the abnormal screening mammogram in February 2024, she underwent left diagnostic mammogram and ultrasound on July 18, 2022, which showed highly suspicious 0.7 cm mass in the left breast 3 o'clock position, with suspicious single left axillary lymph node.  She underwent a biopsy of the breast mass and axillary lymph node on July 30, 2022, both showed invasive adenocarcinoma, ER and PR positive, HER2 negative, with Ki-67 20%.  ASSESSMENT & PLAN: 87 yo postmenopausal woman   Malignant neoplasm of upper-outer quadrant of left breast in female, estrogen receptor positive (HCC) --We discussed her imaging findings and the biopsy results in great details. -Given her advanced age, she is not a  candidate for chemotherapy.  I did not recommend Oncotype -We recommend upfront surgery with lumpectomy and targeted lymph node dissection. She is agreeable  -Giving the strong ER and PR expression in her postmenopausal status, I recommend adjuvant endocrine therapy with aromatase inhibitor for a total of 5 years to reduce the risk of cancer recurrence. Potential benefits and side effects were discussed with patient and she is interested. -She was also seen by radiation oncologist Dr. Roselind Messier today.  Due to her positive lymph nodes, she would benefit from adjuvant radiation. -We also discussed the breast cancer surveillance after her surgery. She will continue annual screening mammogram, self exam, and a routine office visit with lab and exam with [place]. -I encouraged her to have healthy diet and exercise regularly.    Plan -She will proceed with left lumpectomy and targeted lymph node dissection by Dr. Luisa Hart 10/08/2022 -Adjuvant radiation -I will see her towards the end of  her radiation, to finalize her antiestrogen therapy.  Lymphedema issues, if any:      Pain issues, if any:  She reports a episode of shooting/stabbing pain in her breast.   SAFETY ISSUES: Prior radiation? No Pacemaker/ICD? Yes, managed by Dr. Lalla Brothers Possible current pregnancy? no Is the patient on methotrexate? no  Current Complaints / other details:

## 2022-09-24 NOTE — Telephone Encounter (Signed)
Contacted patient to scheduled appointments. Patient is aware of appointments that are scheduled.   

## 2022-09-24 NOTE — Telephone Encounter (Signed)
Spoke to pt and daughter regarding next steps of xrt followed by anti-estrogen oral therapy x5 yrs. Provided education on pathology report as well as the difference b/t local and systemic treatment. Confirmed appt with Dr. Roselind Messier to discuss xrt. No further questions or needs voiced at this time. Encourage pt to call with concerns, contact information provided.

## 2022-09-27 ENCOUNTER — Ambulatory Visit (HOSPITAL_BASED_OUTPATIENT_CLINIC_OR_DEPARTMENT_OTHER)
Admission: RE | Admit: 2022-09-27 | Discharge: 2022-09-27 | Disposition: A | Discharge status: Home / Self Care | Payer: Medicare Other | Source: Ambulatory Visit | Attending: Family Medicine | Admitting: Family Medicine

## 2022-09-27 ENCOUNTER — Encounter: Payer: Self-pay | Admitting: Family Medicine

## 2022-09-27 DIAGNOSIS — R2241 Localized swelling, mass and lump, right lower limb: Secondary | ICD-10-CM

## 2022-09-27 NOTE — Telephone Encounter (Signed)
Called pt- she noted swelling of her right leg 3-4 days ago Mostly just the right leg- the left perhaps a little bit  She did have a breast lumpectomy about 2 weeks ago  She is on xarelto so DVT is less likely  No SOB The leg is not really red or tender It is "achy and sore" Offered appt tomorrow vs ER I was able to get an appt for [place] at 6pm today- she will come in for this procedure

## 2022-09-27 NOTE — Telephone Encounter (Signed)
Do you think she should make an OV for this?

## 2022-09-29 NOTE — Progress Notes (Signed)
HPI:   Ms. Szuch was previously seen in the [facility] due to a personal history of breast cancer and concerns regarding a hereditary predisposition to cancer. Please refer to our prior cancer genetics clinic note for more information regarding our discussion, assessment and recommendations, at the time. Ms. Rucci's recent genetic test results were disclosed to her, as were recommendations warranted by these results. These results and recommendations are discussed in more detail below.  CANCER HISTORY:  In 2010, at the age of 87, Ms. Koval was diagnosed with left invasive ductal carcinoma (ER+/PR+/HER2-) s/p lumpectomy and 5 years of tamoxifen.  She had a left breast biopsy in February 2024 which showed invasive adenocarcinoma (ER+/PR+/HER2-).    Oncology History  Malignant neoplasm of upper-outer quadrant of left breast in female, estrogen receptor positive  07/30/2022 Cancer Staging   Staging form: Breast, AJCC 8th Edition - Clinical stage from 07/30/2022: Stage IB (cT1b, cN1, cM0, G2, ER+, PR+, HER2-) - Signed by Malachy Mood, MD on 08/07/2022 Stage prefix: Initial diagnosis Histologic grading system: 3 grade system   08/06/2022 Initial Diagnosis   Malignant neoplasm of upper-outer quadrant of left breast in female, estrogen receptor positive (HCC)   08/23/2022 Genetic Testing   Negative [facility] Common Hereditary Cancers +RNA Panel.  VUS detected in PMS2 at c.791A>C (p.His264Pro) and POLE at c.444G>C (p.Leu148Phe).  Report date is 08/23/2022.    The [facility] Common Hereditary Cancers + RNA Panel includes sequencing, deletion/duplication, and RNA analysis of the following 48 genes: APC, ATM, AXIN2, BAP1, BARD1, BMPR1A, BRCA1, BRCA2, BRIP1, CDH1, CDK4*, CDKN2A*, CHEK2, CTNNA1, DICER1, EPCAM* (del/dup only), FH, GREM1* (promoter dup analysis only), HOXB13*, KIT*, MBD4*, MEN1, MLH1, MSH2, MSH3, MSH6, MUTYH, NF1, NTHL1, PALB2, PDGFRA*, PMS2, POLD1, POLE, PTEN, RAD51C,  RAD51D, SDHA (sequencing only), SDHB, SDHC, SDHD, SMAD4, SMARCA4, STK11, TP53, TSC1, TSC2, VHL.  *Genes without RNA analysis.    09/11/2022 Cancer Staging   Staging form: Breast, AJCC 8th Edition - Pathologic stage from 09/11/2022: Stage IA (pT1b, pN1a, cM0, G2, ER+, PR+, HER2-) - Signed by Malachy Mood, MD on 09/24/2022 Stage prefix: Initial diagnosis Histologic grading system: 3 grade system Residual tumor (R): R0 - None     FAMILY HISTORY:  We obtained a detailed, 4-generation family history.  Significant diagnoses are listed below:      Family History  Problem Relation Age of Onset   Liver cancer Father 64       Ms. Pines is unaware of previous family history of genetic testing for hereditary cancer risks. Patient's maternal ancestors are of [place] descent, and paternal ancestors are of [place] descent. There is no reported Ashkenazi Jewish ancestry. There is no known consanguinity.  GENETIC TEST RESULTS:  The [facility] Common Hereditary Cancers +RNA Panel found no pathogenic mutations.    The [facility] Common Hereditary Cancers + RNA Panel includes sequencing, deletion/duplication, and RNA analysis of the following 48 genes: APC, ATM, AXIN2, BAP1, BARD1, BMPR1A, BRCA1, BRCA2, BRIP1, CDH1, CDK4*, CDKN2A*, CHEK2, CTNNA1, DICER1, EPCAM* (del/dup only), FH, GREM1* (promoter dup analysis only), HOXB13*, KIT*, MBD4*, MEN1, MLH1, MSH2, MSH3, MSH6, MUTYH, NF1, NTHL1, PALB2, PDGFRA*, PMS2, POLD1, POLE, PTEN, RAD51C, RAD51D, SDHA (sequencing only), SDHB, SDHC, SDHD, SMAD4, SMARCA4, STK11, TP53, TSC1, TSC2, VHL.  *Genes without RNA analysis.   The test report has been scanned into EPIC and is located under the Molecular Pathology section of the Results Review tab.  A portion of the result report is included below for reference. Genetic testing reported out on August 23, 2022.       Genetic testing did identify two variants of uncertain significance (VUS) - one in the PMS2 gene called c.791A>C  (p.His264Pro) and a second in the POLE gene called c.444G>C (p.Leu148Phe).  At this time, it is unknown if these variants are associated with increased cancer risk or if they are normal findings, but most variants such as these get reclassified to being inconsequential. They should not be used to make medical management decisions. With time, we suspect the lab will determine the significance of these variants, if any. If we do learn more about them, we will try to contact Ms. Echavarria to discuss it further. However, it is important to stay in touch with [place] periodically and keep the address and phone number up to date.   Even though a pathogenic variant was not identified, possible explanations for the cancer in the family may include: There may be no hereditary risk for cancer in the family. The cancers in Ms. Matlack and/or her family may be sporadic/familial or due to other genetic and environmental factors. There may be a gene mutation in one of these genes that current testing methods cannot detect but that chance is small. There could be another gene that has not yet been discovered, or that we have not yet tested, that is responsible for the cancer diagnoses in the family.    Therefore, it is important to remain in touch with cancer genetics in the future so that we can continue to offer Ms. Gilbert the most up to date genetic testing.     ADDITIONAL GENETIC TESTING:  We discussed with Ms. Yorio that her genetic testing was fairly extensive.  If there are additional relevant genes identified to increase cancer risk that can be analyzed in the future, we would be happy to discuss and coordinate this testing at that time.    CANCER SCREENING RECOMMENDATIONS:  Ms. Raimer's test result is considered negative (normal).  This means that we have not identified a hereditary cause for her personal history of breast cancer at this time.   An individual's cancer risk and medical management are  not determined by genetic test results alone. Overall cancer risk assessment incorporates additional factors, including personal medical history, family history, and any available genetic information that may result in a personalized plan for cancer prevention and surveillance. Therefore, it is recommended she continue to follow the cancer management and screening guidelines provided by her oncology and primary healthcare provider.  RECOMMENDATIONS FOR FAMILY MEMBERS:   Since she did not inherit a identifiable mutation in a cancer predisposition gene included on this panel, her children could not have inherited a known mutation from her in one of these genes. Individuals in this family might be at some increased risk of developing cancer, over the general population risk, due to the family history of cancer.  Individuals in the family should notify their providers of the family history of cancer. We recommend women in this family have a yearly mammogram beginning at age 100, or 64 years younger than the earliest onset of cancer, an annual clinical breast exam, and perform monthly breast self-exams.   We do not recommend familial testing for the PMS2 or POLE variants of uncertain significance (VUS).  FOLLOW-UP:  Lastly, we discussed with Ms. Rollyson that cancer genetics is a rapidly advancing field and it is possible that new genetic tests will be appropriate for her and/or her family members in the future. We encouraged her to remain  in contact with cancer genetics on an annual basis so we can update her personal and family histories and let her know of advances in cancer genetics that may benefit this family.   Our contact number was provided. Ms. Schreck's questions were answered to her satisfaction, and she knows she is welcome to call us at anytime with additional questions or concerns.   Ainsley Sanguinetti M. Rennie Plowman, MS, [facility] Genetic Counselor [email or web address] (P) [phone]

## 2022-09-30 NOTE — Progress Notes (Addendum)
Cardiology Office Note Date:  06/25/2022  Patient ID:  Sheila Schmidt, DOB March 09, 1936, MRN [id number] PCP:  Pearline Cables, MD  Cardiologist:  Dr. Bing Matter Electrophysiologist: Dr. Lalla Brothers    Chief Complaint:   3 mo  History of Present Illness: Sheila Schmidt is a 87 y.o. female with history of LBBB, NICM, AFlutter, HLD  She saw Dr. Lalla Brothers 03/16/22, 100% AFlutter burden with reports of some episodes of lightheadedness (suspected to be orthostatics hypotension by notes), recently started on amiodarone (several weeks) and planned to pursue DCCV.  Discussed if recurrent arrhythmia post DCCV on amiodarone, next step would be AVN ablation.  DCCV 03/26/22, successful  Saw Dr. Bing Matter 05/16/22, discussed BP a limiter for her GDMT.  05/23/22, ER visit after a fall, described as mechanical a week prior, treated elsewhere, this visit with some L arm swelling/bruising, w/u r/o fracture, discharged  06/18/22, PMD visit, found with PNA  I saw her 06/25/22 She is accompanied by her daughter They have been traveling quite a lot mentioning trips to [facility], [place], and [place] the last few months. The last trip getting caught up in some wet weather and caught a cold/PNA, given Augmentin originally at the [facility] out of town, CXR last week with Dr. Dallas Schimke with residual PNA and her antibiotic changed. She remains with a very persistent and intermittently productive cough. The wheezing better. Last night finally slept pretty good with reduced PM coughing Prior to this illness denied any SOB, cough No CP No near syncope or syncope. She has had 3 falls since August 1st tripped on an extension cord 2nd, got up from bed to use the BR, got lightheaded and fell, did not faint, her daughter add that she has also been drinking that evening. And the last tripped over her other daughter's cat, this she did require laceration repair and evaluation of arm pain (no fracture) No other falls in the  last year No bleeding or signs of bleeding, she has good medication compliance Not convince of volume OL s/p PNA, pending f/u with her PMD BP only 86% though this included pre-DCCV Outputs reduced from acute implant numbers  She saw Dr. Bing Matter 09/06/22, pending breast cancer surgery (lumpectomy and lymph node resection), planned for an updated echo, planned to look towards reducing her amiodarone though wait until recovered from her surgery,   LVEF 55-60%, (from 30% on prior) grade II DD, RVSP 68.74mmHg No significant VHD  09/11/22: lumpectomy w/nodes  TODAY She is accompanied by her daughter Healing up OK after her surgery, has her 1st pre-op visit Thursday to discuss post surgical management strategies for her cancer, sounds like she was not felt a candidate for chemo 2/2 her age, but there is some discussion on possible plans for radiation.  Generally she feels OK, her daughter has noticed a clear improvement in her exertional capacity from 6 mo ago, used to get SOB walking down the hall in the house, now does this easily. Pt denies any CP, palpitations or cardiac awareness No rest SOB, minimal DOE No near syncope or syncope. 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   Hx of adenomatous colonic polyps    Hyperlipidemia     Past Surgical History:  Procedure Laterality Date   ATRIAL FLUTTER ABLATION     BIV PACEMAKER INSERTION CRT-P N/A 12/04/2021   Procedure: BIV PACEMAKER INSERTION CRT-P;  Surgeon: Lanier Prude, MD;  Location: [facility] INVASIVE CV LAB;  Service:  Cardiovascular;  Laterality: N/A;   BREAST EXCISIONAL BIOPSY Left 2010   benign   CARDIOVERSION  05/2020   in [facility]   CARDIOVERSION N/A 06/21/2021   Procedure: CARDIOVERSION;  Surgeon: Sande Rives, MD;  Location: [facility] ENDOSCOPY;  Service: Cardiovascular;  Laterality: N/A;   CARDIOVERSION N/A 03/26/2022   Procedure: CARDIOVERSION;  Surgeon: Lewayne Bunting, MD;  Location: [facility] ENDOSCOPY;  Service: Cardiovascular;  Laterality: N/A;   CHOLECYSTECTOMY     CHOLECYSTECTOMY, LAPAROSCOPIC  '06   [name]   KNEE ARTHROSCOPY     Left '00 [place]/ Right '08   lumpectomy- remote     benign   TOOTH EXTRACTION     TOTAL KNEE ARTHROPLASTY  02/05/2011   left    Current Outpatient Medications  Medication Sig Dispense Refill   acetaminophen (TYLENOL) 500 MG tablet Take 1,000 mg by mouth every 8 (eight) hours as needed for moderate pain.     acetaminophen-codeine (TYLENOL #3) 300-30 MG tablet Take 1 tablet by mouth every 6 (six) hours as needed for moderate pain. 30 tablet 0   albuterol (VENTOLIN HFA) 108 (90 Base) MCG/ACT inhaler Inhale 2 puffs into the lungs every 6 (six) hours as needed for wheezing or shortness of breath. 18 g 6   amiodarone (PACERONE) 200 MG tablet Take 1 tablet (200 mg total) by mouth daily. 30 tablet 6   atorvastatin (LIPITOR) 20 MG tablet Take 1 tablet (20 mg total) by mouth daily. 90 tablet 3   Calcium Carbonate-Vit D-Min (CALTRATE 600+D PLUS MINERALS) 600-800 MG-UNIT CHEW Chew 1 each by mouth daily.     cefdinir (OMNICEF) 300 MG capsule Take 1 capsule (300 mg total) by mouth 2 (two) times daily. 20 capsule 0   Cholecalciferol 50 MCG (2000 UT) TABS Take 2,000 Units by mouth daily.     famotidine (PEPCID) 40 MG tablet Take 40 mg by mouth daily as needed for heartburn or indigestion.     FLUoxetine (PROZAC) 20 MG capsule TAKE 1 CAPSULE BY MOUTH DAILY 90 capsule 3   melatonin 5 MG TABS Take 5 mg by mouth at bedtime.     rivaroxaban (XARELTO) 20 MG TABS tablet Take 1 tablet (20  mg total) by mouth daily with supper. 90 tablet 3   sacubitril-valsartan (ENTRESTO) 24-26 MG Take 1 tablet by mouth 2 (two) times daily. 180 tablet 3   vitamin B-12 (CYANOCOBALAMIN) 100 MCG tablet Take 100 mcg by mouth daily.     No current facility-administered medications for this visit.    Allergies:   Patient has no known allergies.   Social History:  The patient  reports that she has never smoked. She has never been exposed to tobacco smoke. She has never used smokeless tobacco. She reports current alcohol use. She reports that she does not use drugs.   Family History:  The patient's family history includes Cancer in her father; Diabetes in her mother; Heart attack in her mother; Heart disease in her mother and sister.  ROS:  Please see the history of present illness.    All other systems  are reviewed and otherwise negative.   PHYSICAL EXAM:  VS:  BP 121/60   Pulse 60   Ht 5\' 2"  (1.575 m)   Wt 177 lb 6.4 oz (80.5 kg)   SpO2 92%   BMI 32.45 kg/m  BMI: Body mass index is 32.45 kg/m. Well nourished, well developed, in no acute distress HEENT: normocephalic, atraumatic Neck: no JVD, carotid bruits or masses Cardiac: RRR; no significant murmurs, no rubs, or gallops Lungs:  CTA b/l, no ronchi, wheezing or rales Abd: soft, nontender MS: no deformity or  atrophy Ext: R ankle edema, trace on the left Skin: warm and dry, no rash Neuro:  No gross deficits appreciated Psych: euthymic mood, full affect  PPM site is stable, no tethering or discomfort   EKG:  not done today  Device interrogation done today and reviewed by myself:  Battery and lead measurements are good AMS are on 4/2, day of her surgery and false for noise on the A channel No VT CorVue is at/just above threshold   09/06/22: TTE  1. Left ventricular ejection fraction, by estimation, is 55 to 60%. The  left ventricle has normal function. The left ventricle has no regional  wall motion abnormalities. Left  ventricular diastolic parameters are  consistent with Grade II diastolic  dysfunction (pseudonormalization).   2. Right ventricular systolic function is normal. The right ventricular  size is normal. There is severely elevated pulmonary artery systolic  pressure.   3. Left atrial size was moderately dilated.   4. Right atrial size was moderately dilated.   5. The mitral valve is normal in structure. Mild mitral valve  regurgitation. No evidence of mitral stenosis.   6. The aortic valve is calcified. There is mild calcification of the  aortic valve. There is mild thickening of the aortic valve. Aortic valve  regurgitation is mild. No aortic stenosis is present.   7. The inferior vena cava is normal in size with greater than 50%  respiratory variability, suggesting right atrial pressure of 3 mmHg.    11/23/21: TTE 1. Diffuse hypokinesis, worse in the inferior, inferoseptal walls. Poor  acoustic windows make evaluation and comparison to previous echo from 2022  difficult. OVerall, LVEF appears a little worse Consider limited echo with  Definity to confirm LV wall  motion. Left ventricular ejection fraction, by estimation, is 30%. The  left ventricle has moderately decreased function. Left ventricular  diastolic parameters are indeterminate.   2. Right ventricular systolic function is low normal. The right  ventricular size is normal. There is mildly elevated pulmonary artery  systolic pressure.   3. Left atrial size was mildly dilated.   4. The mitral valve is normal in structure. Mild mitral valve  regurgitation.   5. The aortic valve is tricuspid. Aortic valve regurgitation is trivial.  Aortic valve sclerosis is present, with no evidence of aortic valve  stenosis.    Recent Labs: 10/12/2021: Magnesium 1.8 03/21/2022: ALT 13; Hemoglobin 14.3; Platelets 211; TSH 2.270 06/18/2022: BUN 15; Creatinine, Ser 0.96; Potassium 3.6; Pro B Natriuretic peptide (BNP) 326.0; Sodium 142  No results  found for requested labs within last 365 days.   Estimated Creatinine Clearance: 42.9 mL/min (by C-G formula based on SCr of 0.96 mg/dL).   Wt Readings from Last 3 Encounters:  06/25/22 177 lb 6.4 oz (80.5 kg)  06/18/22 178 lb 9.6 oz (81 kg)  05/23/22 178 lb (80.7 kg)     Other studies reviewed: Additional studies/records reviewed today include: summarized above  ASSESSMENT AND PLAN:  CRT-P Intact function No programming changes made  Unclear if XRT is planned or not, there has been discussion given L breast may require moving her device. Await oncology visit and decisions. Asked them to reach out once known, have oncologist/team let us Dr. Lalla Brothers know  NICM LBBB Chronic CHF High pulm pressures, recovered LVEF Feels better, no significant DOE I will defer to Dr. Bing Matter, if any further pul eval or w/u needed  Given no pulm symptoms  With amio on board, might pursue HR CT chest/PFTs, I will d/w Dr. Lalla Brothers ADDEND: 10/15/22 Dr. Lalla Brothers reviewed Not felt needed at this juncture without symptoms. Francis Dowse, PA-C  Persistent AFib CHA2DS2Vasc is 4, on Xarelto, appropriately dosed Amiodarone is chronic, labs are UTD None for several months  Secondary hypercoagulable state   Disposition: will put a visit in a couple months to be sure we are up to date on cancer management plans, device monitoring, decisions.     Current medicines are reviewed at length with the patient today.  The patient did not have any concerns regarding medicines.  Norma Fredrickson, PA-C 06/25/2022 12:29 PM     [facility] [address] [place] [address] [phone] (office)  [phone] (fax)

## 2022-10-02 ENCOUNTER — Encounter: Payer: Self-pay | Admitting: Physician Assistant

## 2022-10-02 ENCOUNTER — Ambulatory Visit: Payer: Medicare Other | Attending: Physician Assistant | Admitting: Physician Assistant

## 2022-10-02 VITALS — BP 136/84 | HR 60 | Ht 62.0 in | Wt 181.0 lb

## 2022-10-02 DIAGNOSIS — I428 Other cardiomyopathies: Secondary | ICD-10-CM | POA: Diagnosis not present

## 2022-10-02 DIAGNOSIS — I4819 Other persistent atrial fibrillation: Secondary | ICD-10-CM

## 2022-10-02 DIAGNOSIS — D6869 Other thrombophilia: Secondary | ICD-10-CM

## 2022-10-02 DIAGNOSIS — Z950 Presence of cardiac pacemaker: Secondary | ICD-10-CM

## 2022-10-02 DIAGNOSIS — I5022 Chronic systolic (congestive) heart failure: Secondary | ICD-10-CM

## 2022-10-02 LAB — CUP PACEART INCLINIC DEVICE CHECK
Battery Remaining Longevity: 76 mo
Battery Voltage: 2.99 V
Brady Statistic RA Percent Paced: 88 %
Brady Statistic RV Percent Paced: 93 %
Date Time Interrogation Session: 20240423172730
Implantable Lead Connection Status: 753985
Implantable Lead Connection Status: 753985
Implantable Lead Connection Status: 753985
Implantable Lead Implant Date: 20230626
Implantable Lead Implant Date: 20230626
Implantable Lead Implant Date: 20230626
Implantable Lead Location: 753858
Implantable Lead Location: 753859
Implantable Lead Location: 753860
Implantable Pulse Generator Implant Date: 20230626
Lead Channel Impedance Value: 412.5 Ohm
Lead Channel Impedance Value: 550 Ohm
Lead Channel Impedance Value: 862.5 Ohm
Lead Channel Pacing Threshold Amplitude: 0.5 V
Lead Channel Pacing Threshold Amplitude: 0.5 V
Lead Channel Pacing Threshold Amplitude: 0.75 V
Lead Channel Pacing Threshold Amplitude: 0.75 V
Lead Channel Pacing Threshold Amplitude: 1 V
Lead Channel Pacing Threshold Pulse Width: 0.5 ms
Lead Channel Pacing Threshold Pulse Width: 0.5 ms
Lead Channel Pacing Threshold Pulse Width: 0.5 ms
Lead Channel Pacing Threshold Pulse Width: 0.5 ms
Lead Channel Pacing Threshold Pulse Width: 0.5 ms
Lead Channel Sensing Intrinsic Amplitude: 1.3 mV
Lead Channel Sensing Intrinsic Amplitude: 12 mV
Lead Channel Setting Pacing Amplitude: 2 V
Lead Channel Setting Pacing Amplitude: 2.5 V
Lead Channel Setting Pacing Amplitude: 2.5 V
Lead Channel Setting Pacing Pulse Width: 0.5 ms
Lead Channel Setting Pacing Pulse Width: 0.5 ms
Lead Channel Setting Sensing Sensitivity: 2 mV
Pulse Gen Model: 3562
Pulse Gen Serial Number: 8071621

## 2022-10-02 NOTE — Progress Notes (Unsigned)
[facility] [address] [place], [place] [address] [phone]  Date:  10/03/2022   Name:  Sheila Schmidt   DOB:  1936-04-21   MRN:  [id number]  PCP:  Pearline Cables, MD    Chief Complaint: No chief complaint on file.   History of Present Illness:  Sheila Schmidt is a 87 y.o. very pleasant female patient who presents with the following:  Pt seen today with concern of leg swelling I last saw her in January History of nonischemic cardiomyopathy with pacemaker placement in June of this year ([id number], atrial fibrillation, hyperlipidemia, chronic knee pain from arthritis, osteopenia, breast cancer LEFT   She contacted me on 4/18 with concern that her right leg was swollen- we got an [place] which was negative for DVT - possible baker's cyst  She just had a lumpectomy for her breast cancer on 4/2  VENOUS US done 4/18 Normal compressibility of the common femoral, superficial femoral, and popliteal veins, as well as the visualized calf veins. Visualized portions of profunda femoral vein and great saphenous vein unremarkable. No filling defects to suggest DVT on grayscale or color Doppler imaging. Doppler waveforms show normal direction of venous flow, normal respiratory plasticity and response to augmentation. Limited views of the contralateral common femoral vein are unremarkable. Nonspecific subcutaneous edema at the ankle. 12 mm cystic structure in the right popliteal fossa, incidental.   IMPRESSION: Negative for DVT in the right lower extremity.  Patient Active Problem List   Diagnosis Date Noted   Genetic testing 09/18/2022   Pacemaker Abbott device BiV 09/06/2022   Malignant neoplasm of upper-outer quadrant of left breast in female, estrogen receptor positive 08/06/2022   Pure hypercholesterolemia 05/15/2022   Skin cancer 03/07/2022   Closed nondisplaced fracture of distal phalanx of right thumb 02/15/2022    Atrial fibrillation 12/04/2021   Cancer 05/25/2021   Left bundle branch block 04/12/2021   Dilated cardiomyopathy 04/12/2021   Vasculitis 02/22/2021   Anxiety state 02/12/2021   Body fluid retention 02/12/2021   Cervical intraepithelial neoplasia 02/12/2021   Insomnia 02/12/2021   Pruritus of vulva 02/12/2021   Leg wound, right 10/03/2020   Bilateral primary osteoarthritis of knee 03/30/2019   Paroxysmal atrial fibrillation 04/07/2015   Chronic anticoagulation 04/07/2015   Paroxysmal atrial flutter 03/16/2015   Essential hypertension, benign 02/24/2014   Sciatica 12/08/2013   Hyperlipidemia 09/25/2013   Osteopenia 11/19/2012   Low back pain 01/26/2010   Carcinoma in situ of breast 09/04/2009   DEPRESSION, PROLONGED 09/04/2009   OA (osteoarthritis) of knee 09/04/2009   PERIPHERAL EDEMA 09/04/2009   COLONIC POLYPS, ADENOMATOUS, HX OF 09/04/2009   Malignant tumor of breast 06/11/2009    Past Medical History:  Diagnosis Date   Cancer     Left breast carcinoma in situ   Cholelithiasis    Depression    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   Dysrhythmia    Afib   Heart murmur    Hx of adenomatous colonic polyps    Hyperlipidemia    Presence of permanent cardiac pacemaker     Past Surgical History:  Procedure Laterality Date   ATRIAL FLUTTER ABLATION     BIV PACEMAKER INSERTION CRT-P N/A 12/04/2021   Procedure: BIV PACEMAKER INSERTION CRT-P;  Surgeon: Lanier Prude, MD;  Location: [facility];  Service: Cardiovascular;  Laterality: N/A;   BREAST BIOPSY Left 07/30/2022  [place] LT BREAST BX W LOC DEV 1ST LESION IMG BX SPEC US GUIDE 07/30/2022 [facility]   BREAST BIOPSY Left 09/10/2022   [place] LT RADIOACTIVE SEED LOC 09/10/2022 [facility]   BREAST BIOPSY Left 09/10/2022   [place] LT RADIOACTIVE SEED LOC 09/10/2022 [facility]   BREAST EXCISIONAL BIOPSY Left 2010   benign   BREAST LUMPECTOMY WITH RADIOACTIVE SEED AND SENTINEL LYMPH NODE  BIOPSY Left 09/11/2022   Procedure: LEFT BREAST LUMPECTOMY WITH RADIOACTIVE SEED;  Surgeon: Harriette Bouillon, MD;  Location: [facility] OR;  Service: General;  Laterality: Left;   CARDIOVERSION  05/2020   in [facility]   CARDIOVERSION N/A 06/21/2021   Procedure: CARDIOVERSION;  Surgeon: Sande Rives, MD;  Location: [facility] ENDOSCOPY;  Service: Cardiovascular;  Laterality: N/A;   CARDIOVERSION N/A 03/26/2022   Procedure: CARDIOVERSION;  Surgeon: Lewayne Bunting, MD;  Location: [facility] ENDOSCOPY;  Service: Cardiovascular;  Laterality: N/A;   CHOLECYSTECTOMY     CHOLECYSTECTOMY, LAPAROSCOPIC  '06   [name]   KNEE ARTHROSCOPY     Left '00 [place]/ Right '08   lumpectomy- remote     benign   RADIOACTIVE SEED GUIDED AXILLARY SENTINEL LYMPH NODE Left 09/11/2022   Procedure: RADIOACTIVE SEED GUIDED LEFT SENTINEL LYMPH NODE BIOPSY;  Surgeon: Harriette Bouillon, MD;  Location: [facility] OR;  Service: General;  Laterality: Left;   TOOTH EXTRACTION     TOTAL KNEE ARTHROPLASTY  02/05/2011   left    Social History   Tobacco Use   Smoking status: Never    Passive exposure: Never   Smokeless tobacco: Never  Vaping Use   Vaping Use: Never used  Substance Use Topics   Alcohol use: Yes    Comment: 1 glass per day   Drug use: No    Family History  Problem Relation Age of Onset   Diabetes Mother        Deceased in 38s   Heart disease Mother        cad/MI-fatal   Heart attack Mother    Liver cancer Father 104   Heart disease Sister    Breast cancer Neg Hx    Colon cancer Neg Hx     No Known Allergies  Medication list has been reviewed and updated.  Current Outpatient Medications on File Prior to Visit  Medication Sig Dispense Refill   acetaminophen (TYLENOL) 500 MG tablet Take 500-1,000 mg by mouth every 8 (eight) hours as needed for moderate pain. (Patient not taking: Reported on 09/06/2022)     acetaminophen-codeine (TYLENOL #3) 300-30 MG tablet Take 1 tablet by mouth every 6 (six) hours as needed for moderate  pain. (Patient not taking: Reported on 09/06/2022) 30 tablet 0   amiodarone (PACERONE) 200 MG tablet Take 1 tablet (200 mg total) by mouth daily. 90 tablet 2   atorvastatin (LIPITOR) 20 MG tablet TAKE 1 TABLET BY MOUTH DAILY (Patient taking differently: Take 20 mg by mouth at bedtime.) 90 tablet 3   Calcium Carbonate-Vit D-Min (CALTRATE 600+D PLUS MINERALS) 600-800 MG-UNIT CHEW Chew 1 each by mouth daily. (Patient not taking: Reported on 09/06/2022)     Cholecalciferol 50 MCG (2000 UT) TABS Take 2,000 Units by mouth daily. (Patient not taking: Reported on 09/06/2022)     ENTRESTO 24-26 MG TAKE 1 TABLET BY MOUTH TWICE  DAILY 180 tablet 3   famotidine (PEPCID) 40 MG tablet Take 40 mg by mouth daily as needed for heartburn or indigestion.     FLUoxetine (PROZAC) 20 MG capsule TAKE 1  CAPSULE BY MOUTH DAILY 90 capsule 3   melatonin 5 MG TABS Take 1.25 mg by mouth at bedtime as needed (sleep). (Patient not taking: Reported on 09/06/2022)     oxyCODONE (OXY IR/ROXICODONE) 5 MG immediate release tablet Take 1 tablet (5 mg total) by mouth every 6 (six) hours as needed for severe pain. 15 tablet 0   vitamin B-12 (CYANOCOBALAMIN) 100 MCG tablet Take 100 mcg by mouth daily. (Patient not taking: Reported on 09/06/2022)     XARELTO 20 MG TABS tablet TAKE 1 TABLET BY MOUTH DAILY  WITH SUPPER 90 tablet 3   No current facility-administered medications on file prior to visit.    Review of Systems:  As per HPI- otherwise negative.   Physical Examination: There were no vitals filed for this visit. There were no vitals filed for this visit. There is no height or weight on file to calculate BMI. Ideal Body Weight:    GEN: no acute distress. HEENT: Atraumatic, Normocephalic.  Ears and Nose: No external deformity. CV: RRR, No M/G/R. No JVD. No thrill. No extra heart sounds. PULM: CTA B, no wheezes, crackles, rhonchi. No retractions. No resp. distress. No accessory muscle use. ABD: S, NT, ND, +BS. No rebound. No  HSM. EXTR: No c/c/e PSYCH: Normally interactive. Conversant.    Assessment and Plan: ***  Signed Abbe Amsterdam, MD

## 2022-10-02 NOTE — Patient Instructions (Signed)
Medication Instructions:    Your physician recommends that you continue on your current medications as directed. Please refer to the Current Medication list given to you today.  *If you need a refill on your cardiac medications before your next appointment, please call your pharmacy*   Lab Work: NONE ORDERED  TODAY    If you have labs (blood work) drawn today and your tests are completely normal, you will receive your results only by: MyChart Message (if you have MyChart) OR A paper copy in the mail If you have any lab test that is abnormal or we need to change your treatment, we will call you to review the results.   Testing/Procedures: NONE ORDERED  TODAY   Follow-Up: At [facility], you and your health needs are our priority.  As part of our continuing mission to provide you with exceptional heart care, we have created designated Provider Care Teams.  These Care Teams include your primary Cardiologist (physician) and Advanced Practice Providers (APPs -  Physician Assistants and Nurse Practitioners) who all work together to provide you with the care you need, when you need it.  We recommend signing up for the patient portal called "MyChart".  Sign up information is provided on this After Visit Summary.  MyChart is used to connect with patients for Virtual Visits (Telemedicine).  Patients are able to view lab/test results, encounter notes, upcoming appointments, etc.  Non-urgent messages can be sent to your provider as well.   To learn more about what you can do with MyChart, go to [email or web address].    Your next appointment:    1 month(s) ( CONTACT [facility] FOR EP SCHEDULING ISSUES )   Provider:   You may see Lanier Prude, MD or one of the following Advanced Practice Providers on your designated Care Team:    Other Instructions

## 2022-10-03 ENCOUNTER — Ambulatory Visit: Payer: Medicare Other | Admitting: Family Medicine

## 2022-10-03 VITALS — BP 110/78 | HR 60 | Temp 97.7°F | Resp 18 | Ht 62.0 in | Wt 182.6 lb

## 2022-10-03 DIAGNOSIS — R600 Localized edema: Secondary | ICD-10-CM

## 2022-10-03 MED ORDER — FUROSEMIDE 20 MG PO TABS
ORAL_TABLET | ORAL | 3 refills | Status: DC
Start: 2022-10-03 — End: 2023-01-24

## 2022-10-03 NOTE — Progress Notes (Signed)
Radiation Oncology         [phone] ________________________________  Name: Sheila Schmidt MRN: [id number]  Date: 10/04/2022  DOB: Dec 09, 1935  Re-Evaluation Note  CC: Copland, Gwenlyn Found, MD  Malachy Mood, MD    ICD-10-CM   1. Malignant neoplasm of upper-outer quadrant of left breast in female, estrogen receptor positive  C50.412    Z17.0       Diagnosis: Stage IA (pT1b, pN1a) Left Breast UOQ, Invasive Ductal Carcinoma, ER+ / PR+ / Her2-, Grade 2: s/p lumpectomy and left axillary SLN biopsies with nodal involvement   Narrative:  The patient returns today to discuss radiation treatment options. She was seen in the multidisciplinary breast clinic on 08/08/22.   She opted to pursue genetic testing which was collected on her breast clinic consultation date. Results showed no clinically significant variants detected by [facility] genetic testing. However, a variant of uncertain significance was identified in the PMS2 gene and in the POLE gene.   Since her consultation date, she opted to proceed with a left breast lumpectomy with left axillary SLN biopsies on 09/11/22 under the care of Dr. Luisa Hart. . Pathology from the procedure revealed: tumor the size of 8 mm; histology of grade 2 invasive ductal carcinoma with a possible small focus of arteriolar LVI; all margins negative for invasive carcinoma; nodal status of 2/3 left axillary sentinel lymph node excisions positive for metastatic carcinoma, measuring 18 mm and 4 mm respectively, with extracapsular extension present in the 18 mm positive node. Prognostic indicators significant for: estrogen receptor 100% positive and progesterone receptor 50% positive, both with strong staining intensity; Proliferation marker Ki67 at 20%; Her2 status negative; Grade 2.   The patient was seen in consultation by Dr. Mosetta Putt during the multidisciplinary breast clinic. To review, the patient is not a candidate for chemotherapy given her advanced age. She will  follow-up with Dr. Mosetta Putt in the near future to discuss antiestrogen treatment options.   Of note: the patient recently presented to her PCP earlier this month with right thigh and ankle swelling x 2 weeks. This prompted a right LE doppler US on 09/27/22 which showed no evidence of DVT in the right lower extremity.    On review of systems, the patient reports to be healing well from the surgery. She denies breast pain, swelling, or nipple discharge. She states the compression bra that she wears is very painful and hard to get on. She sees Dr. Luisa Hart for her post-op appointment on 10/08/22.   Patient does have a left sided pacemaker. She recently saw her pacemaker doctor who told her everything was working normally again. Patient states this has taken 9 months to get her pacemaker to work efficiently.   Allergies:  has No Known Allergies.  Meds: Current Outpatient Medications  Medication Sig Dispense Refill   acetaminophen (TYLENOL) 500 MG tablet Take 500-1,000 mg by mouth every 8 (eight) hours as needed for moderate pain.     acetaminophen-codeine (TYLENOL #3) 300-30 MG tablet Take 1 tablet by mouth every 6 (six) hours as needed for moderate pain. 30 tablet 0   amiodarone (PACERONE) 200 MG tablet Take 1 tablet (200 mg total) by mouth daily. 90 tablet 2   atorvastatin (LIPITOR) 20 MG tablet TAKE 1 TABLET BY MOUTH DAILY (Patient taking differently: Take 20 mg by mouth at bedtime.) 90 tablet 3   Calcium Carbonate-Vit D-Min (CALTRATE 600+D PLUS MINERALS) 600-800 MG-UNIT CHEW Chew 1 each by mouth daily.     Cholecalciferol 50 MCG (  2000 UT) TABS Take 2,000 Units by mouth daily.     ENTRESTO 24-26 MG TAKE 1 TABLET BY MOUTH TWICE  DAILY 180 tablet 3   famotidine (PEPCID) 40 MG tablet Take 40 mg by mouth daily as needed for heartburn or indigestion.     FLUoxetine (PROZAC) 20 MG capsule TAKE 1 CAPSULE BY MOUTH DAILY 90 capsule 3   furosemide (LASIX) 20 MG tablet Start with a 1/2 tablet daily as needed for  swelling, can go up to a whole tablet/ 20 mg if needed 30 tablet 3   melatonin 5 MG TABS Take 1.25 mg by mouth at bedtime as needed (sleep).     vitamin B-12 (CYANOCOBALAMIN) 100 MCG tablet Take 100 mcg by mouth daily.     XARELTO 20 MG TABS tablet TAKE 1 TABLET BY MOUTH DAILY  WITH SUPPER 90 tablet 3   No current facility-administered medications for this encounter.    Physical Findings: The patient is in no acute distress. Patient is alert and oriented.  height is 5\' 2"  (1.575 m) and weight is 182 lb 6.4 oz (82.7 kg). Her temperature is 98 F (36.7 C). Her blood pressure is 168/67 (abnormal) and her pulse is 60. Her respiration is 18 and oxygen saturation is 98%.    Lungs are clear to auscultation bilaterally. Heart has regular rate and rhythm. No palpable cervical, supraclavicular, or axillary adenopathy. Abdomen soft, non-tender, normal bowel sounds. Right Breast: no palpable mass, nipple discharge or bleeding. Left Breast: well approximated surgical incisions circumferential to the nipple and in the axilla. Scar tissue palpated beneath both incisions. No signs of infection present.  Skin below both breasts is significantly irritated where the compression bra sits.   Lab Findings: Lab Results  Component Value Date   WBC 7.9 09/03/2022   HGB 13.6 09/03/2022   HCT 40.8 09/03/2022   MCV 101.0 (H) 09/03/2022   PLT 181 09/03/2022    Radiographic Findings: CUP PACEART INCLINIC DEVICE CHECK  Result Date: 10/02/2022 CRT-P device check in clinic. Normal device function. Thresholds, sensing, impedance consistent with previous measurements. Histograms appropriate for patient and level of activity, + false mode switches (discussed in OV note), no ventricular high rate episodes. Patient bi-ventricularly pacing _93_% of the time. Device programmed with appropriate safety margins. Device heart failure diagnostics are within normal limits and stable over time. Estimated longevity _6.2-6.5 years__.  Patient enrolled in remote follow-up.  US Venous Img Lower Unilateral Right  Result Date: 09/27/2022 CLINICAL DATA:  Right thigh ankle swelling for 2 weeks. EXAM: RIGHT LOWER EXTREMITY VENOUS DOPPLER ULTRASOUND TECHNIQUE: Gray-scale sonography with compression, as well as color and duplex ultrasound, were performed to evaluate the deep venous system(s) from the level of the common femoral vein through the popliteal and proximal calf veins. COMPARISON:  None Available. FINDINGS: VENOUS Normal compressibility of the common femoral, superficial femoral, and popliteal veins, as well as the visualized calf veins. Visualized portions of profunda femoral vein and great saphenous vein unremarkable. No filling defects to suggest DVT on grayscale or color Doppler imaging. Doppler waveforms show normal direction of venous flow, normal respiratory plasticity and response to augmentation. Limited views of the contralateral common femoral vein are unremarkable. Nonspecific subcutaneous edema at the ankle. 12 mm cystic structure in the right popliteal fossa, incidental. IMPRESSION: Negative for DVT in the right lower extremity. Electronically Signed   By: Tiburcio Pea M.D.   On: 09/27/2022 18:32   MM Breast Surgical Specimen  Result Date: 09/11/2022 CLINICAL DATA:  87 year old with biopsy-proven invasive ductal carcinoma and metastatic disease to a LEFT axillary lymph node. Radioactive seed localization was performed yesterday in anticipation of today's lumpectomy and targeted node dissection. EXAM: SPECIMEN RADIOGRAPH OF THE LEFT AXILLARY LYMPH NODE COMPARISON:  Previous exam(s). FINDINGS: Status post excision of the LEFT axillary lymph node. The radioactive seed and the lymph node are present within the surgical specimen. The seed is intact. This was discussed with the operating room nurse at the time of interpretation on 09/11/2022. The lumpectomy specimen radiograph for the LEFT breast is reported separately.  IMPRESSION: Specimen radiograph of the LEFT axillary lymph node. Electronically Signed   By: Hulan Saas M.D.   On: 09/11/2022 09:14  MM Breast Surgical Specimen  Result Date: 09/11/2022 CLINICAL DATA:  Status post LEFT breast lumpectomy today after earlier radioactive seed localization. EXAM: SPECIMEN RADIOGRAPH OF THE LEFT BREAST COMPARISON:  Previous exam(s). FINDINGS: Status post excision of the left breast. The radioactive seed and ribbon shaped biopsy marker clip are present and appear completely intact within the specimen. IMPRESSION: Specimen radiograph of the left breast. Electronically Signed   By: Bary Richard M.D.   On: 09/11/2022 08:20  MM CLIP PLACEMENT LEFT  Result Date: 09/10/2022 CLINICAL DATA:  Post radioactive seed placement mammogram. EXAM: DIAGNOSTIC LEFT MAMMOGRAM POST ULTRASOUND-GUIDED RADIOACTIVE SEED PLACEMENT COMPARISON:  Previous exam(s). FINDINGS: Mammographic images were obtained following ultrasound-guided radioactive seed placements. These demonstrate the radioactive seeds in the mass in the 3 o'clock region of the left breast and in the enlarged left axillary lymph node are in appropriate position. IMPRESSION: Appropriate location of the radioactive seeds. Final Assessment: Post Procedure Mammograms for Seed Placement Electronically Signed   By: Baird Lyons M.D.   On: 09/10/2022 15:26  [place] LT RADIOACTIVE SEED LOC  Result Date: 09/10/2022 CLINICAL DATA:  Biopsy proven invasive ductal carcinoma in the 3 o'clock region of the left breast and metastatic carcinoma to axillary lymph node. Radioactive seed localizations prior to surgery. EXAM: ULTRASOUND GUIDED RADIOACTIVE SEED LOCALIZATIONS OF THE LEFT BREAST AND LEFT AXILLA COMPARISON:  Previous exam(s). FINDINGS: Patient presents for radioactive seed localizations prior to surgery. I met with the patient and we discussed the procedure of seed localization including benefits and alternatives. We discussed the high likelihood  of a successful procedure. We discussed the risks of the procedure including infection, bleeding, tissue injury and further surgery. We discussed the low dose of radioactivity involved in the procedure. Informed, written consent was given. The usual time-out protocol was performed immediately prior to the procedure. Using ultrasound guidance, sterile technique, 1% lidocaine and an I-125 radioactive seed, a mass in the 3 o'clock region of the left breast 3 cm from the nipple was localized using a lateral to medial approach. The follow-up mammogram images confirm the seed in the expected location and were marked for Dr. Luisa Hart. Follow-up survey of the patient confirms presence of the radioactive seed. Order number of I-125 seed:  [id number]. Total activity:  0.260 millicuries reference Date: 08/15/2022 Using ultrasound guidance, sterile technique, 1% lidocaine and an I-125 radioactive seed, a mass in the left axilla was localized using a lateral to medial approach. Follow-up survey of the patient confirms presence of the radioactive seed. Order number of I-125 seed:  [id number]. Total activity:  0.260 millicuries reference Date: 08/15/2022 The patient tolerated the procedure well and was released from the [facility]. She was given instructions regarding seed removal. IMPRESSION: Radioactive seed localizations of the left breast and left axilla. No apparent complications.  Electronically Signed   By: Baird Lyons M.D.   On: 09/10/2022 15:11  [place] LT RADIOACTIVE SEED LOC  Result Date: 09/10/2022 CLINICAL DATA:  Biopsy proven invasive ductal carcinoma in the 3 o'clock region of the left breast and metastatic carcinoma to axillary lymph node. Radioactive seed localizations prior to surgery. EXAM: ULTRASOUND GUIDED RADIOACTIVE SEED LOCALIZATIONS OF THE LEFT BREAST AND LEFT AXILLA COMPARISON:  Previous exam(s). FINDINGS: Patient presents for radioactive seed localizations prior to surgery. I met with the patient and we  discussed the procedure of seed localization including benefits and alternatives. We discussed the high likelihood of a successful procedure. We discussed the risks of the procedure including infection, bleeding, tissue injury and further surgery. We discussed the low dose of radioactivity involved in the procedure. Informed, written consent was given. The usual time-out protocol was performed immediately prior to the procedure. Using ultrasound guidance, sterile technique, 1% lidocaine and an I-125 radioactive seed, a mass in the 3 o'clock region of the left breast 3 cm from the nipple was localized using a lateral to medial approach. The follow-up mammogram images confirm the seed in the expected location and were marked for Dr. Luisa Hart. Follow-up survey of the patient confirms presence of the radioactive seed. Order number of I-125 seed:  [id number]. Total activity:  0.260 millicuries reference Date: 08/15/2022 Using ultrasound guidance, sterile technique, 1% lidocaine and an I-125 radioactive seed, a mass in the left axilla was localized using a lateral to medial approach. Follow-up survey of the patient confirms presence of the radioactive seed. Order number of I-125 seed:  [id number]. Total activity:  0.260 millicuries reference Date: 08/15/2022 The patient tolerated the procedure well and was released from the Breast Center. She was given instructions regarding seed removal. IMPRESSION: Radioactive seed localizations of the left breast and left axilla. No apparent complications. Electronically Signed   By: Baird Lyons M.D.   On: 09/10/2022 15:11  ECHOCARDIOGRAM COMPLETE  Result Date: 09/06/2022    ECHOCARDIOGRAM REPORT   Patient Name:   Sheila Schmidt [facility] Date of Exam: 09/06/2022 Medical Rec #:  [id number]       Height:       62.0 in Accession #:    [id number]      Weight:       176.0 lb Date of Birth:  [birth year]       BSA:          1.811 m Patient Age:    86 years        BP:           124/72 mmHg Patient  Gender: F               HR:           75 bpm. Exam Location:  [place] Procedure: 2D Echo, Cardiac Doppler and Color Doppler Indications:    Dilated cardiomyopathy (HCC) [I42.0 (ICD-10-CM)]. Pre-operative                 cardiovascular examination Z01.810  History:        Patient has prior history of Echocardiogram examinations, most                 recent 11/23/2021. Pacemaker, Arrythmias:Atrial Fibrillation and                 LBBB, Signs/Symptoms:Murmur; Risk Factors:Dyslipidemia.  Sonographer:    Margreta Journey RDCS Referring Phys: [id number] Georgeanna Lea  Sonographer Comments: Global longitudinal strain was attempted. IMPRESSIONS  1. Left ventricular ejection fraction, by estimation, is 55 to 60%. The left ventricle has normal function. The left ventricle has no regional wall motion abnormalities. Left ventricular diastolic parameters are consistent with Grade II diastolic dysfunction (pseudonormalization).  2. Right ventricular systolic function is normal. The right ventricular size is normal. There is severely elevated pulmonary artery systolic pressure.  3. Left atrial size was moderately dilated.  4. Right atrial size was moderately dilated.  5. The mitral valve is normal in structure. Mild mitral valve regurgitation. No evidence of mitral stenosis.  6. The aortic valve is calcified. There is mild calcification of the aortic valve. There is mild thickening of the aortic valve. Aortic valve regurgitation is mild. No aortic stenosis is present.  7. The inferior vena cava is normal in size with greater than 50% respiratory variability, suggesting right atrial pressure of 3 mmHg. FINDINGS  Left Ventricle: Left ventricular ejection fraction, by estimation, is 55 to 60%. The left ventricle has normal function. The left ventricle has no regional wall motion abnormalities. The left ventricular internal cavity size was normal in size. There is  no left ventricular hypertrophy. Left ventricular diastolic  parameters are consistent with Grade II diastolic dysfunction (pseudonormalization). Right Ventricle: The right ventricular size is normal. No increase in right ventricular wall thickness. Right ventricular systolic function is normal. There is severely elevated pulmonary artery systolic pressure. The tricuspid regurgitant velocity is 4.06 m/s, and with an assumed right atrial pressure of 3 mmHg, the estimated right ventricular systolic pressure is 68.9 mmHg. Left Atrium: Left atrial size was moderately dilated. Right Atrium: Right atrial size was moderately dilated. Pericardium: There is no evidence of pericardial effusion. Mitral Valve: The mitral valve is normal in structure. Mild mitral valve regurgitation. No evidence of mitral valve stenosis. Tricuspid Valve: The tricuspid valve is normal in structure. Tricuspid valve regurgitation is mild . No evidence of tricuspid stenosis. Aortic Valve: The aortic valve is calcified. There is mild calcification of the aortic valve. There is mild thickening of the aortic valve. Aortic valve regurgitation is mild. Aortic regurgitation PHT measures 1347 msec. No aortic stenosis is present. Pulmonic Valve: The pulmonic valve was normal in structure. Pulmonic valve regurgitation is mild. No evidence of pulmonic stenosis. Aorta: The aortic root is normal in size and structure. Venous: The inferior vena cava is normal in size with greater than 50% respiratory variability, suggesting right atrial pressure of 3 mmHg. IAS/Shunts: No atrial level shunt detected by color flow Doppler.  LEFT VENTRICLE PLAX 2D LVIDd:         4.70 cm   Diastology LVIDs:         2.90 cm   LV e' medial:    7.51 cm/s LV PW:         0.90 cm   LV E/e' medial:  16.0 LV IVS:        1.00 cm   LV e' lateral:   10.10 cm/s LVOT diam:     1.80 cm   LV E/e' lateral: 11.9 LV SV:         68 LV SV Index:   38 LVOT Area:     2.54 cm  RIGHT VENTRICLE             IVC RV Basal diam:  3.20 cm     IVC diam: 1.70 cm RV Mid  diam:    2.70 cm RV S prime:     12.30 cm/s TAPSE (M-mode): 2.5 cm LEFT ATRIUM  Index        RIGHT ATRIUM           Index LA diam:        4.80 cm 2.65 cm/m   RA Area:     24.80 cm LA Vol (A2C):   66.9 ml 36.95 ml/m  RA Volume:   72.50 ml  40.04 ml/m LA Vol (A4C):   76.5 ml 42.25 ml/m LA Biplane Vol: 73.4 ml 40.54 ml/m  AORTIC VALVE             PULMONIC VALVE LVOT Vmax:   125.00 cm/s PR End Diast Vel: 3.19 msec LVOT Vmean:  79.500 cm/s LVOT VTI:    0.267 m AI PHT:      1347 msec  AORTA Ao Root diam: 3.20 cm Ao Asc diam:  3.50 cm Ao Desc diam: 2.50 cm MITRAL VALVE                TRICUSPID VALVE MV Area (PHT): 4.65 cm     TR Peak grad:   65.9 mmHg MV Decel Time: 163 msec     TR Vmax:        406.00 cm/s MR Peak grad: 87.2 mmHg MR Mean grad: 66.0 mmHg     SHUNTS MR Vmax:      467.00 cm/s   Systemic VTI:  0.27 m MR Vmean:     393.0 cm/s    Systemic Diam: 1.80 cm MV E velocity: 120.00 cm/s MV A velocity: 31.80 cm/s MV E/A ratio:  3.77 Gypsy Balsam MD Electronically signed by Gypsy Balsam MD Signature Date/Time: 09/06/2022/3:25:55 PM    Final     Impression: Stage IA (pT1b, pN1a) Left Breast UOQ, Invasive Ductal Carcinoma, ER+ / PR+ / Her2-, Grade 2: s/p lumpectomy and left axillary SLN biopsies with nodal involvement   Patient is recovering well from her lumpectomy. Given her positive nodes, radiation therapy is typically recommended in her case. She does have a left-sided pacemaker which would need to be moved to the right side to proceed with radiation therapy. Patient is unsure if she would like to proceed. Given the patients age, pacemaker status and estrogen receptivity, she may be a candidate for antiestrogen therapy alone following her surgery.   Today we discussed the nature of grade 2 Invasive Ductal Carcinoma that has spread to the lymph nodes. We discussed the role radiation therapy plays in the treatment of this disease. We educated the patient on benefits, risks, and possible  side effects of radiotherapy.   Plan:  I talked  with Dr. Mosetta Putt who will discuss with the patient risk vs. benefits of antiestrogen therapy alone.  She will see Dr. Luisa Hart on 10/08/22 for post-op appointment. Patient's compression bra is very tight fitting. She is calling Dr. Rosezena Sensor office to see if she still needs to wear it and if she can be re-sized for a new one.   -----------------------------------   Joyice Faster, PA-C   Billie Lade, PhD, MD  This document serves as a record of services personally performed by Antony Blackbird, MD. It was created on his behalf by Neena Rhymes, a trained medical scribe. The creation of this record is based on the scribe's personal observations and the provider's statements to them. This document has been checked and approved by the attending provider.

## 2022-10-03 NOTE — Patient Instructions (Signed)
It was good to see you today-try the furosemide 1/2 tablet ( ) once daily for a few days to see if it may help with your swelling Compression and massage may also be helpful  Please let me know how everything goes for you!

## 2022-10-04 ENCOUNTER — Ambulatory Visit
Admission: RE | Admit: 2022-10-04 | Discharge: 2022-10-04 | Disposition: A | Discharge status: Home / Self Care | Payer: Medicare Other | Source: Ambulatory Visit | Attending: Radiation Oncology | Admitting: Radiation Oncology

## 2022-10-04 ENCOUNTER — Encounter: Payer: Self-pay | Admitting: Radiation Oncology

## 2022-10-04 ENCOUNTER — Other Ambulatory Visit: Payer: Self-pay

## 2022-10-04 ENCOUNTER — Encounter: Payer: Self-pay | Admitting: Cardiology

## 2022-10-04 ENCOUNTER — Ambulatory Visit: Payer: Medicare Other | Admitting: Radiation Oncology

## 2022-10-04 VITALS — BP 168/67 | HR 60 | Temp 98.0°F | Resp 18 | Ht 62.0 in | Wt 182.4 lb

## 2022-10-04 DIAGNOSIS — C773 Secondary and unspecified malignant neoplasm of axilla and upper limb lymph nodes: Secondary | ICD-10-CM | POA: Diagnosis not present

## 2022-10-04 DIAGNOSIS — Z170 Estrogen receptor positive status [ER+]: Secondary | ICD-10-CM | POA: Diagnosis not present

## 2022-10-04 DIAGNOSIS — Z79899 Other long term (current) drug therapy: Secondary | ICD-10-CM | POA: Insufficient documentation

## 2022-10-04 DIAGNOSIS — I083 Combined rheumatic disorders of mitral, aortic and tricuspid valves: Secondary | ICD-10-CM | POA: Diagnosis not present

## 2022-10-04 DIAGNOSIS — R609 Edema, unspecified: Secondary | ICD-10-CM | POA: Diagnosis not present

## 2022-10-04 DIAGNOSIS — E785 Hyperlipidemia, unspecified: Secondary | ICD-10-CM | POA: Diagnosis not present

## 2022-10-04 DIAGNOSIS — C50412 Malignant neoplasm of upper-outer quadrant of left female breast: Secondary | ICD-10-CM | POA: Insufficient documentation

## 2022-10-04 DIAGNOSIS — Z7901 Long term (current) use of anticoagulants: Secondary | ICD-10-CM | POA: Insufficient documentation

## 2022-10-04 NOTE — Progress Notes (Signed)
TO BE COMPLETED BY RADIATION ONCOLOGIST OFFICE:   Patient Name: Sheila Schmidt   Date of Birth: 10/26/1935   Radiation Oncologist: Dr. Roselind Messier   Site to be Treated: Left Breast   Will x-rays >10 MV be used? No   Will the radiation be >10 cm from the device? Yes   Planned Treatment Start Date: TBD   TO BE COMPLETED BY CARDIOLOGIST OFFICE:   Device Information:  Pacemaker      ICD    Brand: [employer]: [id number] Model #: [id number] Alyse Low MP  Serial Number: [id number]     Date of Placement: 12/04/2021  Site of Placement: left upper chest  Remote Device Check--Frequency: q 91 days  Last Check: 10/02/2022  Is the Patient Pacer Dependent?:  Yes   No   Does cardiologist request Radiation Oncology to schedule device testing by vendor for the following:  Prior to the Initiation of Treatments?  Yes  No  During Treatments?  Yes  No  Post Radiation Treatments?  Yes  No   Is device monitoring necessary by vendor/cardiologist team during treatments?  Yes   No   Is cardiac monitoring by Radiation Oncology nursing necessary during treatments? Yes   No   Do you recommend device be relocated prior to Radiation Treatment? Yes   No   **PLEASE LIST ANY NOTES OR SPECIAL REQUESTS:       CARDIOLOGIST SIGNATURE:  Dr. Steffanie Dunn Per Device Clinic Standing Orders, Wiliam Ke  10/04/2022 3:24 PM  **Please route completed form back to Radiation Oncology Nursing and "P [facility] RAD ONC ADMIN", OR send an update if there will be a delay in having form completed by expected start date.  **Call [phone] if you have any questions or do not get an in-basket response from a Radiation Oncology staff member

## 2022-10-05 ENCOUNTER — Telehealth: Payer: Self-pay | Admitting: Hematology

## 2022-10-05 NOTE — Telephone Encounter (Signed)
Contacted patient to scheduled appointments. Left message with appointment details and a call back number if patient had any questions or could not accommodate the time we provided.   

## 2022-10-08 ENCOUNTER — Encounter: Payer: Self-pay | Admitting: *Deleted

## 2022-10-09 ENCOUNTER — Telehealth: Payer: Self-pay | Admitting: Hematology

## 2022-10-16 NOTE — Progress Notes (Signed)
Remote pacemaker transmission.   

## 2022-10-19 ENCOUNTER — Ambulatory Visit: Payer: Medicare Other | Admitting: Hematology

## 2022-10-23 NOTE — Progress Notes (Deleted)
[facility]   [phone] [phone]   Clinic Follow up Note   Patient Care Team: Copland, Gwenlyn Found, MD as PCP - General (Family Medicine) Lanier Prude, MD as PCP - Electrophysiology (Cardiology) Valeria Batman, MD (Inactive) (Orthopedic Surgery) Annamaria Helling, MD (Obstetrics and Gynecology) Harriette Bouillon, MD (General Surgery) Joneen Caraway, PA-C as Physician Assistant (Dermatology) Harriette Bouillon, MD as Consulting Physician (General Surgery) Malachy Mood, MD as Consulting Physician (Hematology) Antony Blackbird, MD as Consulting Physician (Radiation Oncology) Donnelly Angelica, RN as Oncology Nurse Navigator Pershing Proud, RN as Oncology Nurse Navigator  Date of Service:  10/23/2022  CHIEF COMPLAINT: f/u of Newly diagnosed left breast cancer   CURRENT THERAPY:    ASSESSMENT: *** Sheila Schmidt is a 87 y.o. female with   No problem-specific Assessment & Plan notes found for this encounter.  ***   PLAN:   SUMMARY OF ONCOLOGIC HISTORY: Oncology History Overview Note   Cancer Staging  Malignant neoplasm of upper-outer quadrant of left breast in female, estrogen receptor positive (HCC) Staging form: Breast, AJCC 8th Edition - Clinical stage from 07/30/2022: Stage IB (cT1b, cN1, cM0, G2, ER+, PR+, HER2-) - Signed by Malachy Mood, MD on 08/07/2022 Stage prefix: Initial diagnosis Histologic grading system: 3 grade system - Pathologic stage from 09/11/2022: Stage IA (pT1b, pN1a, cM0, G2, ER+, PR+, HER2-) - Signed by Malachy Mood, MD on 09/24/2022 Stage prefix: Initial diagnosis Histologic grading system: 3 grade system Residual tumor (R): R0 - None     Malignant neoplasm of upper-outer quadrant of left breast in female, estrogen receptor positive (HCC)  07/30/2022 Cancer Staging   Staging form: Breast, AJCC 8th Edition - Clinical stage from 07/30/2022: Stage IB (cT1b, cN1, cM0, G2, ER+, PR+, HER2-) - Signed by Malachy Mood, MD on  08/07/2022 Stage prefix: Initial diagnosis Histologic grading system: 3 grade system   08/06/2022 Initial Diagnosis   Malignant neoplasm of upper-outer quadrant of left breast in female, estrogen receptor positive (HCC)   08/23/2022 Genetic Testing   Negative [facility] Common Hereditary Cancers +RNA Panel.  VUS detected in PMS2 at c.791A>C (p.His264Pro) and POLE at c.444G>C (p.Leu148Phe).  Report date is 08/23/2022.    The [facility] Common Hereditary Cancers + RNA Panel includes sequencing, deletion/duplication, and RNA analysis of the following 48 genes: APC, ATM, AXIN2, BAP1, BARD1, BMPR1A, BRCA1, BRCA2, BRIP1, CDH1, CDK4*, CDKN2A*, CHEK2, CTNNA1, DICER1, EPCAM* (del/dup only), FH, GREM1* (promoter dup analysis only), HOXB13*, KIT*, MBD4*, MEN1, MLH1, MSH2, MSH3, MSH6, MUTYH, NF1, NTHL1, PALB2, PDGFRA*, PMS2, POLD1, POLE, PTEN, RAD51C, RAD51D, SDHA (sequencing only), SDHB, SDHC, SDHD, SMAD4, SMARCA4, STK11, TP53, TSC1, TSC2, VHL.  *Genes without RNA analysis.    09/11/2022 Cancer Staging   Staging form: Breast, AJCC 8th Edition - Pathologic stage from 09/11/2022: Stage IA (pT1b, pN1a, cM0, G2, ER+, PR+, HER2-) - Signed by Malachy Mood, MD on 09/24/2022 Stage prefix: Initial diagnosis Histologic grading system: 3 grade system Residual tumor (R): R0 - None   09/11/2022 Pathology Results     FINAL MICROSCOPIC DIAGNOSIS:  A. BREAST, LEFT, LUMPECTOMY: Invasive ductal carcinoma, 0.8 cm, grade II/III Ductal carcinoma in situ: Not identified Margins, invasive: Negative     Closest, invasive: Posterior at 6 mm Margins, DCIS: N/A     Closest, DCIS: N/A Lymphovascular invasion: Suspicious for small arteriolar invasion Prognostic markers:  ER positive, PR positive, Her2 negative, Ki-67 20% % Other: N/A See oncology table  B. LYMPH NODE, LEFT AXILLA, SENTINEL, EXCISION: -  1 lymph  node positive for malignancy, 18 mm in greatest dimension, with extracapsular extension (1/1).  C. LYMPH NODE, LEFT AXILLA,  SENTINEL, EXCISION: -  1 lymph node positive for malignancy, 4 mm in greatest dimension (1/1).  D. LYMPH NODE, LEFT AXILLA, SENTINEL, EXCISION: -  1 lymph node negative for malignancy (0/1).  E. BREAST, LEFT ADDITIONAL MEDIAL MARGIN, EXCISION: -  Benign breast tissue, negative for malignancy. -  New additional margin thickness 7 mm.  F. BREAST, LEFT ADDITIONAL SUPERIOR MARGIN, EXCISION: -  Benign breast tissue, negative for malignancy. -  New additional margin thickness 13 mm.  G. BREAST, LEFT ADDITIONAL INFERIOR MARGIN, EXCISION: -  Benign breast tissue, negative for malignancy. -  New additional margin thickness 6 mm  H. BREAST, LEFT ADDITIONAL LATERAL MARGIN, EXCISION: -  Benign breast tissue, negative for malignancy. -  New additional margin thickness 5 mm.       INTERVAL HISTORY: *** Sheila Schmidt is here for a follow up of Newly diagnosed left breast cancer. She was last seen by me on 08/08/2022. She presents to the clinic    All other systems were reviewed with the patient and are negative.  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   Dysrhythmia    Afib   Heart murmur    Hx of adenomatous colonic polyps    Hyperlipidemia    Presence of permanent cardiac pacemaker     SURGICAL HISTORY: Past Surgical History:  Procedure Laterality Date   ATRIAL FLUTTER ABLATION     BIV PACEMAKER INSERTION CRT-P N/A 12/04/2021   Procedure: BIV PACEMAKER INSERTION CRT-P;  Surgeon: Lanier Prude, MD;  [facility]: [facility];  Service: Cardiovascular;  Laterality: N/A;   BREAST BIOPSY Left 07/30/2022   [place] LT BREAST BX W LOC DEV 1ST LESION IMG BX SPEC US GUIDE 07/30/2022 [facility]   BREAST BIOPSY Left 09/10/2022   [place] LT RADIOACTIVE SEED LOC 09/10/2022 [facility]   BREAST BIOPSY Left 09/10/2022   [place] LT  RADIOACTIVE SEED LOC 09/10/2022 [facility]AMMO[facility]   BREAST EXCISIONAL BIOPSY Left 2010   benign   BREAST LUMPECTOMY WITH RADIOACTIVE SEED AND SENTINEL LYMPH NODE BIOPSY Left 09/11/2022   Procedure: LEFT BREAST LUMPECTOMY WITH RADIOACTIVE SEED;  Surgeon: Harriette Bouillon, MD;  Location: [facility] OR;  Service: General;  Laterality: Left;   CARDIOVERSION  05/2020   in [facility]   CARDIOVERSION N/A 06/21/2021   Procedure: CARDIOVERSION;  Surgeon: Sande Rives, MD;  Location[facility] ENDOSCOPY;  Service: Cardiovascular;  Laterality: N/A;   CARDIOVERSION N/A 03/26/2022   Procedure: CARDIOVERSION;  Surgeon: Lewayne Bunting, MD;  Location: [facility] ENDOSCOPY;  Service: Cardiovascular;  Laterality: N/A;   CHOLECYSTECTOMY     CHOLECYSTECTOMY, LAPAROSCOPIC  '06   [name]   KNEE ARTHROSCOPY     Left '00 [place]/ Right '08   lumpectomy- remote     benign   RADIOACTIVE SEED GUIDED AXILLARY SENTINEL LYMPH NODE Left 09/11/2022   Procedure: RADIOACTIVE SEED GUIDED LEFT SENTINEL LYMPH NODE BIOPSY;  Surgeon: Harriette Bouillon, MD;  Location: [facility] OR;  Service: General;  Laterality: Left;   TOOTH EXTRACTION     TOTAL KNEE ARTHROPLASTY  02/05/2011   left    I have reviewed the social history and family history with the patient and they are unchanged  from previous note.  ALLERGIES:  has No Known Allergies.  MEDICATIONS:  Current Outpatient Medications  Medication Sig Dispense Refill   acetaminophen (TYLENOL) 500 MG tablet Take 500-1,000 mg by mouth every 8 (eight) hours as needed for moderate pain.     acetaminophen-codeine (TYLENOL #3) 300-30 MG tablet Take 1 tablet by mouth every 6 (six) hours as needed for moderate pain. 30 tablet 0   amiodarone (PACERONE) 200 MG tablet Take 1 tablet (200 mg total) by mouth daily. 90 tablet 2   atorvastatin (LIPITOR) 20 MG tablet TAKE 1 TABLET BY MOUTH DAILY (Patient taking differently: Take 20 mg by mouth at bedtime.) 90 tablet 3   Calcium Carbonate-Vit D-Min (CALTRATE 600+D PLUS MINERALS)  600-800 MG-UNIT CHEW Chew 1 each by mouth daily.     Cholecalciferol 50 MCG (2000 UT) TABS Take 2,000 Units by mouth daily.     ENTRESTO 24-26 MG TAKE 1 TABLET BY MOUTH TWICE  DAILY 180 tablet 3   famotidine (PEPCID) 40 MG tablet Take 40 mg by mouth daily as needed for heartburn or indigestion.     FLUoxetine (PROZAC) 20 MG capsule TAKE 1 CAPSULE BY MOUTH DAILY 90 capsule 3   furosemide (LASIX) 20 MG tablet Start with a 1/2 tablet daily as needed for swelling, can go up to a whole tablet/ 20 mg if needed 30 tablet 3   melatonin 5 MG TABS Take 1.25 mg by mouth at bedtime as needed (sleep).     vitamin B-12 (CYANOCOBALAMIN) 100 MCG tablet Take 100 mcg by mouth daily.     XARELTO 20 MG TABS tablet TAKE 1 TABLET BY MOUTH DAILY  WITH SUPPER 90 tablet 3   No current facility-administered medications for this visit.    PHYSICAL EXAMINATION: ECOG PERFORMANCE STATUS: {CHL ONC ECOG PS:[id number]}  There were no vitals filed for this visit. Wt Readings from Last 3 Encounters:  10/04/22 182 lb 6.4 oz (82.7 kg)  10/03/22 182 lb 9.6 oz (82.8 kg)  10/02/22 181 lb (82.1 kg)    {Only keep what was examined. If exam not performed, can use .CEXAM } GENERAL:alert, no distress and comfortable SKIN: skin color, texture, turgor are normal, no rashes or significant lesions EYES: normal, Conjunctiva are pink and non-injected, sclera clear {OROPHARYNX:no exudate, no erythema and lips, buccal mucosa, and tongue normal}  NECK: supple, thyroid normal size, non-tender, without nodularity LYMPH:  no palpable lymphadenopathy in the cervical, axillary {or inguinal} LUNGS: clear to auscultation and percussion with normal breathing effort HEART: regular rate & rhythm and no murmurs and no lower extremity edema ABDOMEN:abdomen soft, non-tender and normal bowel sounds Musculoskeletal:no cyanosis of digits and no clubbing  NEURO: alert & oriented x 3 with fluent speech, no focal motor/sensory deficits  LABORATORY DATA:   I have reviewed the data as listed    Latest Ref Rng & Units 09/03/2022   10:55 AM 08/08/2022   12:16 PM 03/21/2022    3:02 PM  CBC  WBC 4.0 - 10.5 K/uL 7.9  10.3  10.0   Hemoglobin 12.0 - 15.0 g/dL 16.1  09.6  04.5   Hematocrit 36.0 - 46.0 % 40.8  42.3  42.0   Platelets 150 - 400 K/uL 181  233  211         Latest Ref Rng & Units 09/03/2022   10:55 AM 08/08/2022   12:16 PM 06/18/2022   10:22 AM  CMP  Glucose 70 - 99 mg/dL 92  76  409   BUN 8 -  23 mg/dL 14  16  15    Creatinine 0.44 - 1.00 mg/dL 4.09  8.11  9.14   Sodium 135 - 145 mmol/L 135  140  142   Potassium 3.5 - 5.1 mmol/L 4.3  4.0  3.6   Chloride 98 - 111 mmol/L 102  106  105   CO2 22 - 32 mmol/L 26  29  29    Calcium 8.9 - 10.3 mg/dL 9.2  9.2  9.0   Total Protein 6.5 - 8.1 g/dL  6.4    Total Bilirubin 0.3 - 1.2 mg/dL  1.1    Alkaline Phos 38 - 126 U/L  56    AST 15 - 41 U/L  18    ALT 0 - 44 U/L  17        RADIOGRAPHIC STUDIES: I have personally reviewed the radiological images as listed and agreed with the findings in the report. No results found.    No orders of the defined types were placed in this encounter.  All questions were answered. The patient knows to call the clinic with any problems, questions or concerns. No barriers to learning was detected. The total time spent in the appointment was {CHL ONC TIME VISIT - [id number]}.     Salome Holmes, CMA 10/23/2022   I, Monica Martinez, CMA, am acting as scribe for Malachy Mood, MD.   {Add scribe attestation statement}

## 2022-10-23 NOTE — Assessment & Plan Note (Deleted)
-  pT1bN1aM0 stage Ia, G2, ER+/PR+/HER2- -diagnosed in February 2024 -She underwent left lumpectomy and targeted lymph node dissection, I reviewed her surgical path with her, surgical margins were negative, she had 1 positive lymph node. -Due to her advanced age, Oncotype was not recommended. -She has seen radiation oncologist Dr. Roselind Messier after her surgery, benefits of adjuvant radiation was discussed and recommended.  However patient has a pacemaker on same side of her breast cancer, that needs to be removed if she proceeds with radiation.  After discussion, patient opted not to proceed with radiation.  She is willing to try antiestrogen therapy. --Given the strong ER and PR positivity, I do recommend adjuvant aromatase inhibitor to reduce her risk of cancer recurrence,  The potential benefit and side effects, which includes but not limited to, hot flash, skin and vaginal dryness, metabolic changes ( increased blood glucose, cholesterol, weight, etc.), slightly in increased risk of cardiovascular disease, cataracts, muscular and joint discomfort, osteopenia and osteoporosis, etc, were discussed with her in great details. She is interested, and she will start this week. I called in anastrozole for her

## 2022-10-24 ENCOUNTER — Telehealth: Payer: Self-pay | Admitting: Hematology

## 2022-10-24 ENCOUNTER — Inpatient Hospital Stay: Payer: Medicare Other | Admitting: Hematology

## 2022-10-24 DIAGNOSIS — Z170 Estrogen receptor positive status [ER+]: Secondary | ICD-10-CM

## 2022-10-25 ENCOUNTER — Encounter: Payer: Self-pay | Admitting: *Deleted

## 2022-10-28 NOTE — Progress Notes (Deleted)
Cardiology Office Note Date:  06/25/2022  Patient ID:  Sheila Schmidt, DOB [birth year], MRN [id number] PCP:  Sheila Cables, MD  Cardiologist:  Sheila Schmidt Electrophysiologist: Sheila Schmidt    Chief Complaint:   3 mo  History of Present Illness: Sheila Schmidt is a 87 y.o. female with history of LBBB, NICM, AFlutter, HLD  She saw Sheila Schmidt 03/16/22, 100% AFlutter burden with reports of some episodes of lightheadedness (suspected to be orthostatics hypotension by notes), recently started on amiodarone (several weeks) and planned to pursue DCCV.  Discussed if recurrent arrhythmia post DCCV on amiodarone, next step would be AVN ablation.  DCCV 03/26/22, successful  Saw Sheila Schmidt 05/16/22, discussed BP a limiter for her GDMT.  05/23/22, ER visit after a fall, described as mechanical a week prior, treated elsewhere, this visit with some L arm swelling/bruising, w/u r/o fracture, discharged  06/18/22, PMD visit, found with PNA  I saw her 06/25/22 She is accompanied by her daughter They have been traveling quite a lot mentioning trips to [facility], [place], and [place] the last few months. The last trip getting caught up in some wet weather and caught a cold/PNA, given Augmentin originally at the [facility] out of town, CXR last week with Dr. Dallas Schmidt with residual PNA and her antibiotic changed. She remains with a very persistent and intermittently productive cough. The wheezing better. Last night finally slept pretty good with reduced PM coughing Prior to this illness denied any SOB, cough No CP No near syncope or syncope. She has had 3 falls since August 1st tripped on an extension cord 2nd, got up from bed to use the BR, got lightheaded and fell, did not faint, her daughter add that she has also been drinking that evening. And the last tripped over her other daughter's cat, this she did require laceration repair and evaluation of arm pain (no fracture) No other falls in the  last year No bleeding or signs of bleeding, she has good medication compliance Not convince of volume OL s/p PNA, pending f/u with her PMD BP only 86% though this included pre-DCCV Outputs reduced from acute implant numbers  She saw Sheila Schmidt 09/06/22, pending breast cancer surgery (lumpectomy and lymph node resection), planned for an updated echo, planned to look towards reducing her amiodarone though wait until recovered from her surgery,   LVEF 55-60%, (from 30% on prior) grade II DD, RVSP 68.57mmHg No significant VHD  09/11/22: lumpectomy w/nodes  I saw her 10/02/22 She is accompanied by her daughter Healing up OK after her surgery, has her 1st pre-op visit Thursday to discuss post surgical management strategies for her cancer, sounds like she was not felt a candidate for chemo 2/2 her age, but there is some discussion on possible plans for radiation. Generally she feels OK, her daughter has noticed a clear improvement in her exertional capacity from 6 mo ago, used to get SOB walking down the hall in the house, now does this easily. Pt denies any CP, palpitations or cardiac awareness No rest SOB, minimal DOE No near syncope or syncope. No bleeding or signs of bleeding She has had some R ankle swelling initially both a couple weeks ago, now just the R, LE venous study last week negative  In review of notes Not a candidate for chemo given advanced age Pending discussion on perhaps anti estrogen tx No decision yet on XRT or antiestrogen tx alone  *** AF burden *** amio labs *** Xarelto, bleeding,  dose, labs *** volume  Device information Abbott CRT-P, implanted 12/04/2021  AFib/flutter: AAD hx Amiodarone, started approx Aug 2023  Past Medical History:  Diagnosis Date   Cancer [facility])     Left breast carcinoma in situ   Cholelithiasis    Depression    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   Hx of adenomatous colonic polyps    Hyperlipidemia     Past  Surgical History:  Procedure Laterality Date   ATRIAL FLUTTER ABLATION     BIV PACEMAKER INSERTION CRT-P N/A 12/04/2021   Procedure: BIV PACEMAKER INSERTION CRT-P;  Surgeon: Sheila Prude, MD;  Location: [facility] INVASIVE CV LAB;  Service: Cardiovascular;  Laterality: N/A;   BREAST EXCISIONAL BIOPSY Left 2010   benign   CARDIOVERSION  05/2020   in [facility]   CARDIOVERSION N/A 06/21/2021   Procedure: CARDIOVERSION;  Surgeon: Sheila Rives, MD;  Location: [facility] ENDOSCOPY;  Service: Cardiovascular;  Laterality: N/A;   CARDIOVERSION N/A 03/26/2022   Procedure: CARDIOVERSION;  Surgeon: Sheila Bunting, MD;  Location: [facility] ENDOSCOPY;  Service: Cardiovascular;  Laterality: N/A;   CHOLECYSTECTOMY     CHOLECYSTECTOMY, LAPAROSCOPIC  '06   Sheila Schmidt   KNEE ARTHROSCOPY     Left '00 Sheila Schmidt/ Right '08   lumpectomy- remote     benign   TOOTH EXTRACTION     TOTAL KNEE ARTHROPLASTY  02/05/2011   left    Current Outpatient Medications  Medication Sig Dispense Refill   acetaminophen (TYLENOL) 500 MG tablet Take 1,000 mg by mouth every 8 (eight) hours as needed for moderate pain.     acetaminophen-codeine (TYLENOL #3) 300-30 MG tablet Take 1 tablet by mouth every 6 (six) hours as needed for moderate pain. 30 tablet 0   albuterol (VENTOLIN HFA) 108 (90 Base) MCG/ACT inhaler Inhale 2 puffs into the lungs every 6 (six) hours as needed for wheezing or shortness of breath. 18 g 6   amiodarone (PACERONE) 200 MG tablet Take 1 tablet (200 mg total) by mouth daily. 30 tablet 6   atorvastatin (LIPITOR) 20 MG tablet Take 1 tablet (20 mg total) by mouth daily. 90 tablet 3   Calcium Carbonate-Vit D-Min (CALTRATE 600+D PLUS MINERALS) 600-800 MG-UNIT CHEW Chew 1 each by mouth daily.     cefdinir (OMNICEF) 300 MG capsule Take 1 capsule (300 mg total) by mouth 2 (two) times daily. 20 capsule 0   Cholecalciferol 50 MCG (2000 UT) TABS Take 2,000 Units by mouth daily.     famotidine (PEPCID) 40 MG tablet Take 40 mg by mouth  daily as needed for heartburn or indigestion.     FLUoxetine (PROZAC) 20 MG capsule TAKE 1 CAPSULE BY MOUTH DAILY 90 capsule 3   melatonin 5 MG TABS Take 5 mg by mouth at bedtime.     rivaroxaban (XARELTO) 20 MG TABS tablet Take 1 tablet (20 mg total) by mouth daily with supper. 90 tablet 3   sacubitril-valsartan (ENTRESTO) 24-26 MG Take 1 tablet by mouth 2 (two) times daily. 180 tablet 3   vitamin B-12 (CYANOCOBALAMIN) 100 MCG tablet Take 100 mcg by mouth daily.     No current facility-administered medications for this visit.    Allergies:   Patient has no known allergies.   Social History:  The patient  reports that she has never smoked. She has never been exposed to tobacco smoke. She has never used smokeless tobacco. She reports current alcohol use. She reports that she does not use drugs.  Family History:  The patient's family history includes Cancer in her father; Diabetes in her mother; Heart attack in her mother; Heart disease in her mother and sister.  ROS:  Please see the history of present illness.    All other systems are reviewed and otherwise negative.   PHYSICAL EXAM:  VS:  BP 121/60   Pulse 60   Ht 5\' 2"  (1.575 m)   Wt 177 lb 6.4 oz (80.5 kg)   SpO2 92%   BMI 32.45 kg/m  BMI: Body mass index is 32.45 kg/m. Well nourished, well developed, in no acute distress HEENT: normocephalic, atraumatic Neck: no JVD, carotid bruits or masses Cardiac: *** RRR; no significant murmurs, no rubs, or gallops Lungs: ***  CTA b/l, no ronchi, wheezing or rales Abd: soft, nontender MS: no deformity or  atrophy Ext: *** R ankle edema, trace on the left Skin: warm and dry, no rash Neuro:  No gross deficits appreciated Psych: euthymic mood, full affect  *** PPM site is stable, no tethering or discomfort   EKG:  not done today  Device interrogation done today and reviewed by myself:  ***   09/06/22: TTE  1. Left ventricular ejection fraction, by estimation, is 55 to 60%. The   left ventricle has normal function. The left ventricle has no regional  wall motion abnormalities. Left ventricular diastolic parameters are  consistent with Grade II diastolic  dysfunction (pseudonormalization).   2. Right ventricular systolic function is normal. The right ventricular  size is normal. There is severely elevated pulmonary artery systolic  pressure.   3. Left atrial size was moderately dilated.   4. Right atrial size was moderately dilated.   5. The mitral valve is normal in structure. Mild mitral valve  regurgitation. No evidence of mitral stenosis.   6. The aortic valve is calcified. There is mild calcification of the  aortic valve. There is mild thickening of the aortic valve. Aortic valve  regurgitation is mild. No aortic stenosis is present.   7. The inferior vena cava is normal in size with greater than 50%  respiratory variability, suggesting right atrial pressure of 3 mmHg.    11/23/21: TTE 1. Diffuse hypokinesis, worse in the inferior, inferoseptal walls. Poor  acoustic windows make evaluation and comparison to previous echo from 2022  difficult. OVerall, LVEF appears a little worse Consider limited echo with  Definity to confirm LV wall  motion. Left ventricular ejection fraction, by estimation, is 30%. The  left ventricle has moderately decreased function. Left ventricular  diastolic parameters are indeterminate.   2. Right ventricular systolic function is low normal. The right  ventricular size is normal. There is mildly elevated pulmonary artery  systolic pressure.   3. Left atrial size was mildly dilated.   4. The mitral valve is normal in structure. Mild mitral valve  regurgitation.   5. The aortic valve is tricuspid. Aortic valve regurgitation is trivial.  Aortic valve sclerosis is present, with no evidence of aortic valve  stenosis.    Recent Labs: 10/12/2021: Magnesium 1.8 03/21/2022: ALT 13; Hemoglobin 14.3; Platelets 211; TSH 2.270 06/18/2022:  BUN 15; Creatinine, Ser 0.96; Potassium 3.6; Pro B Natriuretic peptide (BNP) 326.0; Sodium 142  No results found for requested labs within last 365 days.   Estimated Creatinine Clearance: 42.9 mL/min (by C-G formula based on SCr of 0.96 mg/dL).   Wt Readings from Last 3 Encounters:  06/25/22 177 lb 6.4 oz (80.5 kg)  06/18/22 178 lb 9.6 oz (81 kg)  05/23/22 178 lb (80.7 kg)     Other studies reviewed: Additional studies/records reviewed today include: summarized above  ASSESSMENT AND PLAN:  CRT-P *** Intact function No programming changes made  *** Unclear if XRT is planned or not, there has been discussion given L breast may require moving her device. Await oncology visit and decisions. *** Asked them to reach out once known, have oncologist/team let us Sheila Schmidt know  NICM LBBB Chronic CHF High pulm pressures, recovered LVEF Feels better, no significant DOE C/w Sheila Schmidt *** no pulm symptoms In review by Sheila Schmidt, without symptoms not felt necessary to pursue HR CT chest/PFTs   Persistent AFib CHA2DS2Vasc is 4, on Xarelto, *** appropriately dosed Amiodarone is chronic, labs are UTD *** None for several months  Secondary hypercoagulable state   Disposition: ***     Current medicines are reviewed at length with the patient today.  The patient did not have any concerns regarding medicines.  Norma Fredrickson, PA-C 06/25/2022 12:29 PM     [facility] [address] [place] [address] [phone] (office)  [phone] (fax)

## 2022-10-30 ENCOUNTER — Encounter: Payer: Self-pay | Admitting: Family Medicine

## 2022-10-30 ENCOUNTER — Encounter: Payer: Self-pay | Admitting: Cardiology

## 2022-10-30 NOTE — Telephone Encounter (Signed)
The only thing open is the 11:40

## 2022-10-31 ENCOUNTER — Ambulatory Visit: Payer: Medicare Other | Admitting: Physician Assistant

## 2022-10-31 ENCOUNTER — Ambulatory Visit: Payer: Medicare Other | Admitting: Family Medicine

## 2022-10-31 VITALS — BP 124/76 | HR 75 | Temp 97.6°F | Resp 18 | Ht 62.0 in | Wt 176.2 lb

## 2022-10-31 DIAGNOSIS — J069 Acute upper respiratory infection, unspecified: Secondary | ICD-10-CM

## 2022-10-31 NOTE — Therapy (Unsigned)
OUTPATIENT PHYSICAL THERAPY BREAST CANCER POST OP FOLLOW UP   Patient Name: Sheila Schmidt MRN: [id number] DOB:08-Jul-1935, 87 y.o., female Today's Date: 11/01/2022  END OF SESSION:  PT End of Session - 11/01/22 1049     Visit Number 2    Number of Visits 10    Date for PT Re-Evaluation 11/29/22    Authorization Type [employer] Medicare    Authorization Time Period 11/01/2022 - 11/29/2022    PT Start Time 1054    PT Stop Time 1146    PT Time Calculation (min) 52 min    Activity Tolerance Patient tolerated treatment well    Behavior During Therapy WFL for tasks assessed/performed             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   Dysrhythmia    Afib   Heart murmur    Hx of adenomatous colonic polyps    Hyperlipidemia    Presence of permanent cardiac pacemaker    Past Surgical History:  Procedure Laterality Date   ATRIAL FLUTTER ABLATION     BIV PACEMAKER INSERTION CRT-P N/A 12/04/2021   Procedure: BIV PACEMAKER INSERTION CRT-P;  Surgeon: Lanier Prude, MD;  [facility]: [facility]AS[facility];  Service: Cardiovascular;  Laterality: N/A;   BREAST BIOPSY Left 07/30/2022   [place] LT BREAST BX W LOC DEV 1ST LESION IMG BX SPEC US GUIDE 07/30/2022 [facility]   BREAST BIOPSY Left 09/10/2022   [place] LT RADIOACTIVE SEED LOC 09/10/2022 [facility]   BREAST BIOPSY Left 09/10/2022   [place] LT RADIOACTIVE SEED LOC 09/10/2022 [facility]   BREAST EXCISIONAL BIOPSY Left 2010   benign   BREAST LUMPECTOMY WITH RADIOACTIVE SEED AND SENTINEL LYMPH NODE BIOPSY Left 09/11/2022   Procedure: LEFT BREAST LUMPECTOMY WITH RADIOACTIVE SEED;  Surgeon: Harriette Bouillon, MD;  Location: [facility];  Service: General;  Laterality: Left;   CARDIOVERSION  05/2020   in [facility]   CARDIOVERSION N/A 06/21/2021   Procedure: CARDIOVERSION;  Surgeon: Sande Rives, MD;  Location:  [facility] ENDOSCOPY;  Service: Cardiovascular;  Laterality: N/A;   CARDIOVERSION N/A 03/26/2022   Procedure: CARDIOVERSION;  Surgeon: Lewayne Bunting, MD;  Location: [facility] ENDOSCOPY;  Service: Cardiovascular;  Laterality: N/A;   CHOLECYSTECTOMY     CHOLECYSTECTOMY, LAPAROSCOPIC  '06   [name]   KNEE ARTHROSCOPY     Left '00 Rendall/ Right '08   lumpectomy- remote     benign   RADIOACTIVE SEED GUIDED AXILLARY SENTINEL LYMPH NODE Left 09/11/2022   Procedure: RADIOACTIVE SEED GUIDED LEFT SENTINEL LYMPH NODE BIOPSY;  Surgeon: Harriette Bouillon, MD;  Location: [facility] OR;  Service: General;  Laterality: Left;   TOOTH EXTRACTION     TOTAL KNEE ARTHROPLASTY  02/05/2011   left   Patient Active Problem List   Diagnosis Date Noted   Genetic testing 09/18/2022   Pacemaker Abbott device BiV 09/06/2022   Malignant neoplasm of upper-outer quadrant of left breast in female, estrogen receptor positive (HCC) 08/06/2022   Pure hypercholesterolemia 05/15/2022   Skin cancer 03/07/2022   Closed nondisplaced fracture of distal phalanx of right thumb 02/15/2022   Atrial fibrillation (HCC) 12/04/2021   Cancer (HCC) 05/25/2021   Left bundle branch block 04/12/2021   Dilated cardiomyopathy (HCC) 04/12/2021   Vasculitis (HCC) 02/22/2021   Anxiety state 02/12/2021  Body fluid retention 02/12/2021   Cervical intraepithelial neoplasia 02/12/2021   Insomnia 02/12/2021   Pruritus of vulva 02/12/2021   Leg wound, right 10/03/2020   Bilateral primary osteoarthritis of knee 03/30/2019   Paroxysmal atrial fibrillation (HCC) 04/07/2015   Chronic anticoagulation 04/07/2015   Paroxysmal atrial flutter (HCC) 03/16/2015   Essential hypertension, benign 02/24/2014   Sciatica 12/08/2013   Hyperlipidemia 09/25/2013   Osteopenia 11/19/2012   Low back pain 01/26/2010   Carcinoma in situ of breast 09/04/2009   DEPRESSION, PROLONGED 09/04/2009   OA (osteoarthritis) of knee 09/04/2009   PERIPHERAL EDEMA 09/04/2009   COLONIC POLYPS,  ADENOMATOUS, HX OF 09/04/2009   Malignant tumor of breast (HCC) 06/11/2009    REFERRING PROVIDER: Dr. Harriette Bouillon  REFERRING DIAG: Left breast cancer  THERAPY DIAG:  Malignant neoplasm of lower-outer quadrant of left breast of female, estrogen receptor positive (HCC) - Plan: PT plan of care cert/re-cert  Abnormal posture - Plan: PT plan of care cert/re-cert  Aftercare following surgery for neoplasm - Plan: PT plan of care cert/re-cert  Muscle weakness (generalized) - Plan: PT plan of care cert/re-cert  Difficulty in walking, not elsewhere classified - Plan: PT plan of care cert/re-cert  Rationale for Evaluation and Treatment: Rehabilitation  ONSET DATE: 09/11/2022  SUBJECTIVE:                                                                                                                                                                                           SUBJECTIVE STATEMENT: Patient reports she underwent a left lumpectomy and sentinel node biopsy on 09/11/2022 with 2 of her 3 axillary nodes positive. She has had radiation recommended but does not want to do it because her pacemaker was just placed in July 2023. She plans to undergo anti-estrogen therapy and possibly seek a second opinion about radiation at [facility]. She reports that hre biggest complaint at this time is right knee pain because she needs a knee replacement.  PERTINENT HISTORY:  Patient was diagnosed on 07/30/2022 with left grade 2 invasive ductal carcinoma breast cancer. She underwent a left lumpectomy and sentinel node biopsy on 09/11/2022 with 2 of her 3 axillary nodes positive. It is ER/PR positive and HER2 negative with a Ki67 of 20%. She has a pacemaker and a previous history of left breast cancer in 2010. She underwent a lumpectomy and sentinel node biopsy (2 negative nodes) followed by tamoxifen. She declined radiation. She has had 4 cardioversions per her report. Also hx of left knee replacement.   PATIENT GOALS:   Reassess how my recovery is going related to arm function, pain, and swelling.  PAIN:  Are you having pain? No  PRECAUTIONS: Recent Surgery, left UE Lymphedema risk, ICD/Pacemaker  ACTIVITY LEVEL / LEISURE: She is doing some exercises for stretching her shoulder   OBJECTIVE:   PATIENT SURVEYS:  QUICK DASH:  Quick Dash - 11/01/22 0001     Open a tight or new jar Mild difficulty    Do heavy household chores (wash walls, wash floors) Moderate difficulty    Carry a shopping bag or briefcase No difficulty    Wash your back Mild difficulty    Use a knife to cut food No difficulty    Recreational activities in which you take some force or impact through your arm, shoulder, or hand (golf, hammering, tennis) Unable    During the past week, to what extent has your arm, shoulder or hand problem interfered with your normal social activities with family, friends, neighbors, or groups? Not at all    During the past week, to what extent has your arm, shoulder or hand problem limited your work or other regular daily activities Modererately    Arm, shoulder, or hand pain. None    Tingling (pins and needles) in your arm, shoulder, or hand None    Difficulty Sleeping No difficulty    DASH Score 22.73 %             OBSERVATIONS: Left breast and axillary incisions both appear to be well healed. There is tightness present and scar tissue present under her incision sites. No edema present.  POSTURE:  Forward head and rounded shoulders  LYMPHEDEMA ASSESSMENT:   UPPER EXTREMITY AROM/PROM:   A/PROM RIGHT   eval    Shoulder extension 42  Shoulder flexion 130  Shoulder abduction 130  Shoulder internal rotation 55  Shoulder external rotation 66                          (Blank rows = not tested)   A/PROM LEFT   eval LEFT 11/01/2022  Shoulder extension 48 43  Shoulder flexion 116 102  Shoulder abduction 118 106  Shoulder internal rotation 37 37  Shoulder external rotation 78 69                           (Blank rows = not tested)   CERVICAL AROM: All within functional limits   UPPER EXTREMITY STRENGTH: Functional  LOWER EXTREMITY STRENGTH: Right hip flexor 4-/5 Right knee extension 4+/5 Right knee flexion 4-/5  Sit to stand 10x without UEs = 40.5 seconds  TUG test  = 17 seconds   LYMPHEDEMA ASSESSMENTS:    LANDMARK RIGHT   eval RIGHT 11/01/2022  10 cm proximal to olecranon process 28 27  Olecranon process 23.7 24.4  10 cm proximal to ulnar styloid process 21.2 21.3  Just proximal to ulnar styloid process 15.1 14.8  Across hand at thumb web space 18 18  At base of 2nd digit 6.1 6  (Blank rows = not tested)   LANDMARK LEFT   eval LEFT 11/01/2022  10 cm proximal to olecranon process 29.4 29.5  Olecranon process 25.7 26.6  10 cm proximal to ulnar styloid process 22.8 21.8  Just proximal to ulnar styloid process 15.7 15.5  Across hand at thumb web space 17.5 17.8  At base of 2nd digit 6 5.9  (Blank rows = not tested)    Surgery type/Date: Left lumpectomy and sentinel node biopsy 09/11/2022 Number of lymph nodes removed: 3  Current/past treatment (chemo, radiation, hormone therapy): none Other symptoms:  Heaviness/tightness No Pain No Pitting edema No Infections No Decreased scar mobility Yes Stemmer sign No  PATIENT EDUCATION:  Education details: HEP and scar massage Person educated: Patient and Child(ren) Education method: Explanation, Demonstration, Verbal cues, and Handouts Education comprehension: verbalized understanding and returned demonstration  HOME EXERCISE PROGRAM: Reviewed previously given post op HEP.   ASSESSMENT:  CLINICAL IMPRESSION: Patient is recovering well from her left breast cancer surgery on 09/11/2022. She had 2 of 3 nodes positive but is likely not going to do radiation because should would have to undergo another procedure to have her pacemaker moved to the right side. She is going to talk with her surgeon about seeking a  second opinion about radiation at [facility] versus getting an ALND. She expressed that her balance and activity level has declined since she lost her husband and that she has stopped exercising since he has been gone. She will benefit from PT to address balance and improve overall strength, and also improve shoulder ROM and scar mobility.  Pt will benefit from skilled therapeutic intervention to improve on the following deficits: Decreased knowledge of precautions, impaired UE functional use, pain, decreased ROM, postural dysfunction, balance and strength.  PT treatment/interventions: ADL/Self care home management, Therapeutic exercises, Therapeutic activity, Neuromuscular re-education, Balance training, Gait training, Patient/Family education, Self Care, Stair training, Manual lymph drainage, scar mobilization, Manual therapy, and Re-evaluation   GOALS: Goals reviewed with patient? Yes  LONG TERM GOALS:  (STG=LTG)  GOALS Name Target Date  Goal status  1 Pt will demonstrate she has regained full shoulder ROM and function post operatively compared to baselines.  Baseline: 11/29/2022 IN PROGRESS  2 Patient will improve her TUG time to be </= 14 seconds for improved overall mobility and safety. 11/29/2022 INITIAL  3 Patient will demonstrate she can perform sit to stand without use of UE from standard chair height 10 times in </= 34 seconds. 11/29/2022 INITIAL  4 Patient will report she has returned to some time of regular exercise such as water exercise or a stationary bike (limited by her knee) 11/29/2022 INITIAL     PLAN:  PT FREQUENCY/DURATION: 2x/week for 4 weeks  PLAN FOR NEXT SESSION: Scar massage to breast/axillary incisions; ROM exercises for left shoulder; balance and leg strengthening exercises in standing (NuStep?)   [facility]  [address], [address]  [place] [address]  [phone]  After Breast Cancer Class It is recommended you attend the ABC class to  be educated on lymphedema risk reduction. This class is free of charge and lasts for 1 hour. It is a 1-time class. You will need to download the TEAMS app either on your phone or computer. We will send you a link the night before or the morning of the class. You should be able to click on that link to join the class. This is not a confidential class. You don't have to turn your camera on, but other participants may be able to see your email address. You're scheduled for June 17th at 12:00.  Scar massage You can begin gentle scar massage to you incision sites. Gently place one hand on the incision and move the skin (without sliding on the skin) in various directions. Do this for a few minutes and then you can gently massage either coconut oil or vitamin E cream into the scars.  Compression garment You should continue wearing your compression bra until you feel like you  no longer have swelling.  Home exercise Program Continue doing the exercises you were given until you feel like you can do them without feeling any tightness at the end. Start doing the first and last ones on the paper laying flat on your back.  Walking Program Studies show that 30 minutes of walking per day (fast enough to elevate your heart rate) can significantly reduce the risk of a cancer recurrence. If you can't walk due to other medical reasons, we encourage you to find another activity you could do (like a stationary bike or water exercise).  Posture After breast cancer surgery, people frequently sit with rounded shoulders posture because it puts their incisions on slack and feels better. If you sit like this and scar tissue forms in that position, you can become very tight and have pain sitting or standing with good posture. Try to be aware of your posture and sit and stand up tall to heal properly.  Bethann Punches, [place] 11/01/22 1:07 PM

## 2022-10-31 NOTE — Progress Notes (Signed)
[facility] [address], [address] [place], [place] [address] [phone]  Date:  10/31/2022   Name:  Sheila Schmidt   DOB:  1935/08/06   MRN:  [id number]  PCP:  Pearline Cables, MD    Chief Complaint: URI (Pt had been having some Scratchy throat, fatigue. Has tried Mucinex. She says today has been better.)   History of Present Illness:  Sheila Schmidt is a 87 y.o. very pleasant female patient who presents with the following:  Patient seen today with concern of possible upper respiratory infection I saw her most recently in April with concern of leg swelling History of nonischemic cardiomyopathy with pacemaker placement in June of this year (29528, atrial fibrillation, hyperlipidemia, chronic knee pain from arthritis, osteopenia, breast cancer LEFT   She was found to have recurrent breast cancer this year, had a lumpectomy in early April 2024 They have been thinking about radiation but this would require moving her pacemaker-Per notes, it looks like they plan to get a second opinion from [facility] to help go over all treatment options  Her daughter Lynnell Dike contacted me yesterday, and we made this appointment:  This is Susie on [employer] account. Mom has been very tired this week, has a runny nose and scratchy throat. Her voice is deep.  She's been taking  Mucinex for 4 days.  She doesn't appear to have a sinus infection like I had.  I'm all better thanks to the meds.  I'm just worried about her letting this go and ending up with pneumonia again.  Thoughts?  This am however she woke up feeling well- she had taken ill about a week ago with runny nose, other URI sx  Her daughter was sick with a viral type illness recently - Daughter got better with abx for sinusitis and was concerned her mother might get the same thing.  Only other concern is more pronounced curvature of her right great toenail which can cause some  discomfort  Patient Active Problem List   Diagnosis Date Noted   Genetic testing 09/18/2022   Pacemaker Abbott device BiV 09/06/2022   Malignant neoplasm of upper-outer quadrant of left breast in female, estrogen receptor positive (HCC) 08/06/2022   Pure hypercholesterolemia 05/15/2022   Skin cancer 03/07/2022   Closed nondisplaced fracture of distal phalanx of right thumb 02/15/2022   Atrial fibrillation (HCC) 12/04/2021   Cancer (HCC) 05/25/2021   Left bundle branch block 04/12/2021   Dilated cardiomyopathy (HCC) 04/12/2021   Vasculitis (HCC) 02/22/2021   Anxiety state 02/12/2021   Body fluid retention 02/12/2021   Cervical intraepithelial neoplasia 02/12/2021   Insomnia 02/12/2021   Pruritus of vulva 02/12/2021   Leg wound, right 10/03/2020   Bilateral primary osteoarthritis of knee 03/30/2019   Paroxysmal atrial fibrillation (HCC) 04/07/2015   Chronic anticoagulation 04/07/2015   Paroxysmal atrial flutter (HCC) 03/16/2015   Essential hypertension, benign 02/24/2014   Sciatica 12/08/2013   Hyperlipidemia 09/25/2013   Osteopenia 11/19/2012   Low back pain 01/26/2010   Carcinoma in situ of breast 09/04/2009   DEPRESSION, PROLONGED 09/04/2009   OA (osteoarthritis) of knee 09/04/2009   PERIPHERAL EDEMA 09/04/2009   COLONIC POLYPS, ADENOMATOUS, HX OF 09/04/2009   Malignant tumor of breast (HCC) 06/11/2009    Past Medical History:  Diagnosis Date   Cancer (HCC)     Left breast carcinoma in situ   Cholelithiasis    Depression    DJD (degenerative  joint disease) of knee    bilateral   Dysrhythmia    Afib   Heart murmur    Hx of adenomatous colonic polyps    Hyperlipidemia    Presence of permanent cardiac pacemaker     Past Surgical History:  Procedure Laterality Date   ATRIAL FLUTTER ABLATION     BIV PACEMAKER INSERTION CRT-P N/A 12/04/2021   Procedure: BIV PACEMAKER INSERTION CRT-P;  Surgeon: Lanier Prude, MD;  Location: [facility] INVASIVE CV LAB;  Service:  Cardiovascular;  Laterality: N/A;   BREAST BIOPSY Left 07/30/2022   [place] LT BREAST BX W LOC DEV 1ST LESION IMG BX SPEC US GUIDE 07/30/2022 [facility]AMMOGRAPHY   BREAST BIOPSY Left 09/10/2022   [place] LT RADIOACTIVE SEED LOC 09/10/2022 [facility] MAMMOGRAPHY   BREAST BIOPSY Left 09/10/2022   [place] LT RADIOACTIVE SEED LOC 09/10/2022 [facility]AMMOGRAPHY   BREAST EXCISIONAL BIOPSY Left 2010   benign   BREAST LUMPECTOMY WITH RADIOACTIVE SEED AND SENTINEL LYMPH NODE BIOPSY Left 09/11/2022   Procedure: LEFT BREAST LUMPECTOMY WITH RADIOACTIVE SEED;  Surgeon: Harriette Bouillon, MD;  Location: [facility] OR;  Service: General;  Laterality: Left;   CARDIOVERSION  05/2020   in [facility]   CARDIOVERSION N/A 06/21/2021   Procedure: CARDIOVERSION;  Surgeon: Sande Rives, MD;  Location: [facility] ENDOSCOPY;  Service: Cardiovascular;  Laterality: N/A;   CARDIOVERSION N/A 03/26/2022   Procedure: CARDIOVERSION;  Surgeon: Lewayne Bunting, MD;  Location: [facility] ENDOSCOPY;  Service: Cardiovascular;  Laterality: N/A;   CHOLECYSTECTOMY     CHOLECYSTECTOMY, LAPAROSCOPIC  '06   [name]   KNEE ARTHROSCOPY     Left '00 Rend[place]/ Right '08   lumpectomy- remote     benign   RADIOACTIVE SEED GUIDED AXILLARY SENTINEL LYMPH NODE Left 09/11/2022   Procedure: RADIOACTIVE SEED GUIDED LEFT SENTINEL LYMPH NODE BIOPSY;  Surgeon: Harriette Bouillon, MD;  Location: [facility] OR;  Service: General;  Laterality: Left;   TOOTH EXTRACTION     TOTAL KNEE ARTHROPLASTY  02/05/2011   left    Social History   Tobacco Use   Smoking status: Never    Passive exposure: Never   Smokeless tobacco: Never  Vaping Use   Vaping Use: Never used  Substance Use Topics   Alcohol use: Yes    Comment: 1 glass per day   Drug use: No    Family History  Problem Relation Age of Onset   Diabetes Mother        Deceased in 19s   Heart disease Mother        cad/MI-fatal   Heart attack Mother    Liver cancer Father 41   Heart disease Sister    Breast cancer Neg Hx    Colon cancer Neg Hx      No Known Allergies  Medication list has been reviewed and updated.  Current Outpatient Medications on File Prior to Visit  Medication Sig Dispense Refill   acetaminophen (TYLENOL) 500 MG tablet Take 500-1,000 mg by mouth every 8 (eight) hours as needed for moderate pain.     acetaminophen-codeine (TYLENOL #3) 300-30 MG tablet Take 1 tablet by mouth every 6 (six) hours as needed for moderate pain. 30 tablet 0   amiodarone (PACERONE) 200 MG tablet Take 1 tablet (200 mg total) by mouth daily. 90 tablet 2   atorvastatin (LIPITOR) 20 MG tablet TAKE 1 TABLET BY MOUTH DAILY (Patient taking differently: Take 20 mg by mouth at bedtime.) 90 tablet 3   Calcium Carbonate-Vit D-Min (CALTRATE 600+D PLUS  MINERALS) 600-800 MG-UNIT CHEW Chew 1 each by mouth daily.     Cholecalciferol 50 MCG (2000 UT) TABS Take 2,000 Units by mouth daily.     ENTRESTO 24-26 MG TAKE 1 TABLET BY MOUTH TWICE  DAILY 180 tablet 3   famotidine (PEPCID) 40 MG tablet Take 40 mg by mouth daily as needed for heartburn or indigestion.     FLUoxetine (PROZAC) 20 MG capsule TAKE 1 CAPSULE BY MOUTH DAILY 90 capsule 3   furosemide (LASIX) 20 MG tablet Start with a 1/2 tablet daily as needed for swelling, can go up to a whole tablet/ 20 mg if needed 30 tablet 3   melatonin 5 MG TABS Take 1.25 mg by mouth at bedtime as needed (sleep).     vitamin B-12 (CYANOCOBALAMIN) 100 MCG tablet Take 100 mcg by mouth daily.     XARELTO 20 MG TABS tablet TAKE 1 TABLET BY MOUTH DAILY  WITH SUPPER 90 tablet 3   No current facility-administered medications on file prior to visit.    Review of Systems:  As per HPI- otherwise negative.   Physical Examination: Vitals:   10/31/22 1139  BP: 124/76  Pulse: 75  Resp: 18  Temp: 97.6 F (36.4 C)  SpO2: 98%   Vitals:   10/31/22 1139  Weight: 176 lb 3.2 oz (79.9 kg)  Height: 5\' 2"  (1.575 m)   Body mass index is 32.23 kg/m. Ideal Body Weight: Weight in (lb) to have BMI = 25: 136.4  GEN: no  acute distress.  Obese, looks well HEENT: Atraumatic, Normocephalic.  Bilateral TM wnl, oropharynx normal.  PEERL,EOMI.   Ears and Nose: No external deformity. CV: RRR, No M/G/R. No JVD. No thrill. No extra heart sounds. PULM: CTA B, no wheezes, crackles, rhonchi. No retractions. No resp. distress. No accessory muscle use. ABD: S, NT, ND EXTR: No c/c/e PSYCH: Normally interactive. Conversant.  The right great toenail displays increased curvature which could cause discomfort-however, it does not appear to be acutely ingrown, no sign of infection and it is not actually tender on exam today  Assessment and Plan: Viral upper respiratory tract infection  Patient seen today with recent likely viral URI.  However, she is feeling better today- her symptoms seem to have resolved.  In this case, we will continue to observe-they will let me know if symptoms should return  We discussed some options for improving her toenail.  Suggested that she try an external toenail splint which can help the nail to grow with less curvature  Signed Abbe Amsterdam, MD

## 2022-10-31 NOTE — Patient Instructions (Signed)
Glad you are feeling better!  A toenail brace may help to lessen the curve of your toenail and improve comfort

## 2022-11-01 ENCOUNTER — Encounter: Payer: Self-pay | Admitting: Physical Therapy

## 2022-11-01 ENCOUNTER — Ambulatory Visit: Payer: Medicare Other | Attending: Surgery | Admitting: Physical Therapy

## 2022-11-01 DIAGNOSIS — Z170 Estrogen receptor positive status [ER+]: Secondary | ICD-10-CM | POA: Diagnosis present

## 2022-11-01 DIAGNOSIS — R2681 Unsteadiness on feet: Secondary | ICD-10-CM | POA: Insufficient documentation

## 2022-11-01 DIAGNOSIS — C50512 Malignant neoplasm of lower-outer quadrant of left female breast: Secondary | ICD-10-CM | POA: Insufficient documentation

## 2022-11-01 DIAGNOSIS — M6281 Muscle weakness (generalized): Secondary | ICD-10-CM | POA: Diagnosis present

## 2022-11-01 DIAGNOSIS — R293 Abnormal posture: Secondary | ICD-10-CM | POA: Diagnosis present

## 2022-11-01 DIAGNOSIS — M25561 Pain in right knee: Secondary | ICD-10-CM | POA: Diagnosis present

## 2022-11-01 DIAGNOSIS — G8929 Other chronic pain: Secondary | ICD-10-CM | POA: Diagnosis present

## 2022-11-01 DIAGNOSIS — R262 Difficulty in walking, not elsewhere classified: Secondary | ICD-10-CM | POA: Diagnosis present

## 2022-11-01 DIAGNOSIS — Z483 Aftercare following surgery for neoplasm: Secondary | ICD-10-CM | POA: Insufficient documentation

## 2022-11-01 NOTE — Patient Instructions (Signed)
[facility]  [address]  [place] [address]  [phone]  After Breast Cancer Class It is recommended you attend the ABC class to be educated on lymphedema risk reduction. This class is free of charge and lasts for 1 hour. It is a 1-time class. You will need to download the TEAMS app either on your phone or computer. We will send you a link the night before or the morning of the class. You should be able to click on that link to join the class. This is not a confidential class. You don't have to turn your camera on, but other participants may be able to see your email address. You're scheduled for June 17th at 12:00.  Scar massage You can begin gentle scar massage to you incision sites. Gently place one hand on the incision and move the skin (without sliding on the skin) in various directions. Do this for a few minutes and then you can gently massage either coconut oil or vitamin E cream into the scars.  Compression garment You should continue wearing your compression bra until you feel like you no longer have swelling.  Home exercise Program Continue doing the exercises you were given until you feel like you can do them without feeling any tightness at the end. Start doing the first and last ones on the paper laying flat on your back.  Walking Program Studies show that 30 minutes of walking per day (fast enough to elevate your heart rate) can significantly reduce the risk of a cancer recurrence. If you can't walk due to other medical reasons, we encourage you to find another activity you could do (like a stationary bike or water exercise).  Posture After breast cancer surgery, people frequently sit with rounded shoulders posture because it puts their incisions on slack and feels better. If you sit like this and scar tissue forms in that position, you can become very tight and have pain sitting or standing with good posture. Try to be aware of your posture  and sit and stand up tall to heal properly.

## 2022-11-06 ENCOUNTER — Ambulatory Visit: Payer: Medicare Other | Admitting: Physical Therapy

## 2022-11-06 ENCOUNTER — Encounter: Payer: Self-pay | Admitting: Physical Therapy

## 2022-11-06 DIAGNOSIS — G8929 Other chronic pain: Secondary | ICD-10-CM

## 2022-11-06 DIAGNOSIS — Z170 Estrogen receptor positive status [ER+]: Secondary | ICD-10-CM

## 2022-11-06 DIAGNOSIS — C50512 Malignant neoplasm of lower-outer quadrant of left female breast: Secondary | ICD-10-CM | POA: Diagnosis not present

## 2022-11-06 DIAGNOSIS — R262 Difficulty in walking, not elsewhere classified: Secondary | ICD-10-CM

## 2022-11-06 DIAGNOSIS — M6281 Muscle weakness (generalized): Secondary | ICD-10-CM

## 2022-11-06 DIAGNOSIS — R293 Abnormal posture: Secondary | ICD-10-CM

## 2022-11-06 DIAGNOSIS — R2681 Unsteadiness on feet: Secondary | ICD-10-CM

## 2022-11-06 DIAGNOSIS — Z483 Aftercare following surgery for neoplasm: Secondary | ICD-10-CM

## 2022-11-06 NOTE — Therapy (Signed)
OUTPATIENT PHYSICAL THERAPY BREAST CANCER POST OP FOLLOW UP   Patient Name: Sheila Schmidt MRN: [id number] DOB:[birth year], 87 y.o., female Today's Date: 11/06/2022  END OF SESSION:  PT End of Session - 11/06/22 1404     Visit Number 3    Number of Visits 10    Date for PT Re-Evaluation 11/29/22    Authorization Type [employer] Medicare    Authorization Time Period 11/01/2022 - 11/29/2022    PT Start Time 1403    PT Stop Time 1445    PT Time Calculation (min) 42 min    Activity Tolerance Patient tolerated treatment well    Behavior During Therapy WFL for tasks assessed/performed              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   Dysrhythmia    Afib   Heart murmur    Hx of adenomatous colonic polyps    Hyperlipidemia    Presence of permanent cardiac pacemaker    Past Surgical History:  Procedure Laterality Date   ATRIAL FLUTTER ABLATION     BIV PACEMAKER INSERTION CRT-P N/A 12/04/2021   Procedure: BIV PACEMAKER INSERTION CRT-P;  Surgeon: Lanier Prude, MD;  Location: [facility];  Service: Cardiovascular;  Laterality: N/A;   BREAST BIOPSY Left 07/30/2022   [place] LT BREAST BX W LOC DEV 1ST LESION IMG BX SPEC US GUIDE 07/30/2022 [facility]   BREAST BIOPSY Left 09/10/2022   [place] LT RADIOACTIVE SEED LOC 09/10/2022 [facility]   BREAST BIOPSY Left 09/10/2022   [place] LT RADIOACTIVE SEED LOC 09/10/2022 [facility]   BREAST EXCISIONAL BIOPSY Left 2010   benign   BREAST LUMPECTOMY WITH RADIOACTIVE SEED AND SENTINEL LYMPH NODE BIOPSY Left 09/11/2022   Procedure: LEFT BREAST LUMPECTOMY WITH RADIOACTIVE SEED;  Surgeon: Harriette Bouillon, MD;  Location: [facility] OR;  Service: General;  Laterality: Left;   CARDIOVERSION  05/2020   in [facility]   CARDIOVERSION N/A 06/21/2021   Procedure: CARDIOVERSION;  Surgeon: Sande Rives, MD;   Location: [facility] ENDOSCOPY;  Service: Cardiovascular;  Laterality: N/A;   CARDIOVERSION N/A 03/26/2022   Procedure: CARDIOVERSION;  Surgeon: Lewayne Bunting, MD;  Location: [facility] ENDOSCOPY;  Service: Cardiovascular;  Laterality: N/A;   CHOLECYSTECTOMY     CHOLECYSTECTOMY, LAPAROSCOPIC  '06   [name]   KNEE ARTHROSCOPY     Left '00 [place]/ Right '08   lumpectomy- remote     benign   RADIOACTIVE SEED GUIDED AXILLARY SENTINEL LYMPH NODE Left 09/11/2022   Procedure: RADIOACTIVE SEED GUIDED LEFT SENTINEL LYMPH NODE BIOPSY;  Surgeon: Harriette Bouillon, MD;  Location: [facility] OR;  Service: General;  Laterality: Left;   TOOTH EXTRACTION     TOTAL KNEE ARTHROPLASTY  02/05/2011   left   Patient Active Problem List   Diagnosis Date Noted   Genetic testing 09/18/2022   Pacemaker [employer] device BiV 09/06/2022   Malignant neoplasm of upper-outer quadrant of left breast in female, estrogen receptor positive (HCC) 08/06/2022   Pure hypercholesterolemia 05/15/2022   Skin cancer 03/07/2022   Closed nondisplaced fracture of distal phalanx of right thumb 02/15/2022   Atrial fibrillation (HCC) 12/04/2021   Cancer (HCC) 05/25/2021   Left bundle branch block 04/12/2021   Dilated cardiomyopathy (HCC) 04/12/2021   Vasculitis (HCC) 02/22/2021   Anxiety state 02/12/2021  Body fluid retention 02/12/2021   Cervical intraepithelial neoplasia 02/12/2021   Insomnia 02/12/2021   Pruritus of vulva 02/12/2021   Leg wound, right 10/03/2020   Bilateral primary osteoarthritis of knee 03/30/2019   Paroxysmal atrial fibrillation (HCC) 04/07/2015   Chronic anticoagulation 04/07/2015   Paroxysmal atrial flutter (HCC) 03/16/2015   Essential hypertension, benign 02/24/2014   Sciatica 12/08/2013   Hyperlipidemia 09/25/2013   Osteopenia 11/19/2012   Low back pain 01/26/2010   Carcinoma in situ of breast 09/04/2009   DEPRESSION, PROLONGED 09/04/2009   OA (osteoarthritis) of knee 09/04/2009   PERIPHERAL EDEMA 09/04/2009   COLONIC  POLYPS, ADENOMATOUS, HX OF 09/04/2009   Malignant tumor of breast (HCC) 06/11/2009    REFERRING PROVIDER: Dr. Harriette Bouillon  REFERRING DIAG: Left breast cancer  THERAPY DIAG:  Unsteadiness on feet  Chronic pain of right knee  Difficulty in walking, not elsewhere classified  Muscle weakness (generalized)  Aftercare following surgery for neoplasm  Abnormal posture  Malignant neoplasm of lower-outer quadrant of left breast of female, estrogen receptor positive (HCC)  Rationale for Evaluation and Treatment: Rehabilitation  ONSET DATE: 09/11/2022  SUBJECTIVE:                                                                                                                                                                                           SUBJECTIVE STATEMENT: I just have pain in my R knee today and it is always there. I have been doing the stretches for my arm.   PERTINENT HISTORY:  Patient was diagnosed on 07/30/2022 with left grade 2 invasive ductal carcinoma breast cancer. She underwent a left lumpectomy and sentinel node biopsy on 09/11/2022 with 2 of her 3 axillary nodes positive. It is ER/PR positive and HER2 negative with a Ki67 of 20%. She has a pacemaker and a previous history of left breast cancer in 2010. She underwent a lumpectomy and sentinel node biopsy (2 negative nodes) followed by tamoxifen. She declined radiation. She has had 4 cardioversions per her report. Also hx of left knee replacement.   PATIENT GOALS:  Reassess how my recovery is going related to arm function, pain, and swelling.  PAIN:  Are you having pain?  Yes - pt did not rate, pt reports tylenol helps, R knee  PRECAUTIONS: Recent Surgery, left UE Lymphedema risk, ICD/Pacemaker  ACTIVITY LEVEL / LEISURE: She is doing some exercises for stretching her shoulder   OBJECTIVE:   PATIENT SURVEYS:  QUICK DASH:    OBSERVATIONS: Left breast and axillary incisions both appear to be well healed. There  is tightness present and scar tissue present under her incision sites. No  edema present.  POSTURE:  Forward head and rounded shoulders  LYMPHEDEMA ASSESSMENT:   UPPER EXTREMITY AROM/PROM:   A/PROM RIGHT   eval    Shoulder extension 42  Shoulder flexion 130  Shoulder abduction 130  Shoulder internal rotation 55  Shoulder external rotation 66                          (Blank rows = not tested)   A/PROM LEFT   eval LEFT 11/01/2022  Shoulder extension 48 43  Shoulder flexion 116 102  Shoulder abduction 118 106  Shoulder internal rotation 37 37  Shoulder external rotation 78 69                          (Blank rows = not tested)   CERVICAL AROM: All within functional limits   UPPER EXTREMITY STRENGTH: Functional  LOWER EXTREMITY STRENGTH: Right hip flexor 4-/5 Right knee extension 4+/5 Right knee flexion 4-/5  Sit to stand 10x without UEs = 40.5 seconds  TUG test  = 17 seconds   LYMPHEDEMA ASSESSMENTS:    LANDMARK RIGHT   eval RIGHT 11/01/2022  10 cm proximal to olecranon process 28 27  Olecranon process 23.7 24.4  10 cm proximal to ulnar styloid process 21.2 21.3  Just proximal to ulnar styloid process 15.1 14.8  Across hand at thumb web space 18 18  At base of 2nd digit 6.1 6  (Blank rows = not tested)   LANDMARK LEFT   eval LEFT 11/01/2022  10 cm proximal to olecranon process 29.4 29.5  Olecranon process 25.7 26.6  10 cm proximal to ulnar styloid process 22.8 21.8  Just proximal to ulnar styloid process 15.7 15.5  Across hand at thumb web space 17.5 17.8  At base of 2nd digit 6 5.9  (Blank rows = not tested)    Surgery type/Date: Left lumpectomy and sentinel node biopsy 09/11/2022 Number of lymph nodes removed: 3 Current/past treatment (chemo, radiation, hormone therapy): none Other symptoms:  Heaviness/tightness No Pain No Pitting edema No Infections No Decreased scar mobility Yes Stemmer sign No  TREATMENT PERFORMED: 11/06/22:  Nustep level 1,  seat at 6, arms at 6, 7 min - 541 steps, 97%, 60bpm - has pacemaker Seated knee extension with 2 lb ankle wieghts x 10 reps Seated marching with 2 lb ankle weights x 10  Ball squeezes x 10 with 3 sec holds Standing 3 way hip in // bars with no resistance x 10 reps in each direction with v/c and t/c to keep knee straight throughout, pt had increased difficulty when weight bearing on R side due to knee pain Step ups on air ex x 10 reps Tandem walking with 1 x HHA on // bars x 4 with pt able to achieve nearly full tandem stance Backwards walking x 2 with 1 HHA and no losses of balance Stepping over 4 orange hurdles x 4 with pt improving each rep and by end was not touching the hurdle with her foot  Heel raises x 10 with HHA Toe raises x 10 with HHA  PATIENT EDUCATION:  Education details: HEP and scar massage Person educated: Patient and Child(ren) Education method: Explanation, Demonstration, Verbal cues, and Handouts Education comprehension: verbalized understanding and returned demonstration  HOME EXERCISE PROGRAM: Reviewed previously given post op HEP.   ASSESSMENT:  CLINICAL IMPRESSION: Began working on improving LE strength and balance. Pt reports prior to her husband's  passing she exercised at the gym regularly. Began strengthening today with pt having occasional R knee pain on medial side. Pt did well with new balance exercises today and required 1 HHA on // bars mainly. Educated pt to let therapist know next session if she had increased discomfort or fatigue so we can adjust next session accordingly.   Pt will benefit from skilled therapeutic intervention to improve on the following deficits: Decreased knowledge of precautions, impaired UE functional use, pain, decreased ROM, postural dysfunction, balance and strength.  PT treatment/interventions: ADL/Self care home management, Therapeutic exercises, Therapeutic activity, Neuromuscular re-education, Balance training, Gait training,  Patient/Family education, Self Care, Stair training, Manual lymph drainage, scar mobilization, Manual therapy, and Re-evaluation   GOALS: Goals reviewed with patient? Yes  LONG TERM GOALS:  (STG=LTG)  GOALS Name Target Date  Goal status  1 Pt will demonstrate she has regained full shoulder ROM and function post operatively compared to baselines.  Baseline: 11/29/2022 IN PROGRESS  2 Patient will improve her TUG time to be </= 14 seconds for improved overall mobility and safety. 11/29/2022 INITIAL  3 Patient will demonstrate she can perform sit to stand without use of UE from standard chair height 10 times in </= 34 seconds. 11/29/2022 INITIAL  4 Patient will report she has returned to some time of regular exercise such as water exercise or a stationary bike (limited by her knee) 11/29/2022 INITIAL     PLAN:  PT FREQUENCY/DURATION: 2x/week for 4 weeks  PLAN FOR NEXT SESSION: Scar massage to breast/axillary incisions; ROM exercises for left shoulder; balance and leg strengthening exercises in standing (NuStep?) Add new LE exercises to HEP if pt tolerate them well   [facility]  [address], [address]  [place] [address]  [phone]  After Breast Cancer Class It is recommended you attend the ABC class to be educated on lymphedema risk reduction. This class is free of charge and lasts for 1 hour. It is a 1-time class. You will need to download the TEAMS app either on your phone or computer. We will send you a link the night before or the morning of the class. You should be able to click on that link to join the class. This is not a confidential class. You don't have to turn your camera on, but other participants may be able to see your email address. You're scheduled for June 17th at 12:00.  Scar massage You can begin gentle scar massage to you incision sites. Gently place one hand on the incision and move the skin (without sliding on the skin) in various  directions. Do this for a few minutes and then you can gently massage either coconut oil or vitamin E cream into the scars.  Compression garment You should continue wearing your compression bra until you feel like you no longer have swelling.  Home exercise Program Continue doing the exercises you were given until you feel like you can do them without feeling any tightness at the end. Start doing the first and last ones on the paper laying flat on your back.  Walking Program Studies show that 30 minutes of walking per day (fast enough to elevate your heart rate) can significantly reduce the risk of a cancer recurrence. If you can't walk due to other medical reasons, we encourage you to find another activity you could do (like a stationary bike or water exercise).  Posture After breast cancer surgery, people frequently sit with rounded shoulders posture because  it puts their incisions on slack and feels better. If you sit like this and scar tissue forms in that position, you can become very tight and have pain sitting or standing with good posture. Try to be aware of your posture and sit and stand up tall to heal properly.  Bethann Punches, [place] 11/06/22 2:58 PM

## 2022-11-07 ENCOUNTER — Ambulatory Visit: Payer: Medicare Other | Admitting: Family Medicine

## 2022-11-08 ENCOUNTER — Ambulatory Visit: Payer: Medicare Other | Admitting: Physical Therapy

## 2022-11-08 ENCOUNTER — Encounter: Payer: Self-pay | Admitting: Physical Therapy

## 2022-11-08 DIAGNOSIS — Z483 Aftercare following surgery for neoplasm: Secondary | ICD-10-CM

## 2022-11-08 DIAGNOSIS — C50512 Malignant neoplasm of lower-outer quadrant of left female breast: Secondary | ICD-10-CM

## 2022-11-08 DIAGNOSIS — R262 Difficulty in walking, not elsewhere classified: Secondary | ICD-10-CM

## 2022-11-08 DIAGNOSIS — R293 Abnormal posture: Secondary | ICD-10-CM

## 2022-11-08 DIAGNOSIS — G8929 Other chronic pain: Secondary | ICD-10-CM

## 2022-11-08 DIAGNOSIS — R2681 Unsteadiness on feet: Secondary | ICD-10-CM

## 2022-11-08 DIAGNOSIS — M6281 Muscle weakness (generalized): Secondary | ICD-10-CM

## 2022-11-08 NOTE — Therapy (Signed)
OUTPATIENT PHYSICAL THERAPY BREAST CANCER POST OP FOLLOW UP   Patient Name: Sheila Schmidt MRN: [id number] DOB:21-Jan-1936, 87 y.o., female Today's Date: 11/08/2022  END OF SESSION:  PT End of Session - 11/08/22 1150     Visit Number 4    Number of Visits 10    Date for PT Re-Evaluation 11/29/22    Authorization Type [employer] Medicare    Authorization Time Period 11/01/2022 - 11/29/2022    PT Start Time 1046    PT Stop Time 1143    PT Time Calculation (min) 57 min    Activity Tolerance Patient tolerated treatment well    Behavior During Therapy WFL for tasks assessed/performed               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   Dysrhythmia    Afib   Heart murmur    Hx of adenomatous colonic polyps    Hyperlipidemia    Presence of permanent cardiac pacemaker    Past Surgical History:  Procedure Laterality Date   ATRIAL FLUTTER ABLATION     BIV PACEMAKER INSERTION CRT-P N/A 12/04/2021   Procedure: BIV PACEMAKER INSERTION CRT-P;  Surgeon: Lanier Prude, MD;  Location: [facility];  Service: Cardiovascular;  Laterality: N/A;   BREAST BIOPSY Left 07/30/2022   [place] LT BREAST BX W LOC DEV 1ST LESION IMG BX SPEC US GUIDE 07/30/2022 [facility]   BREAST BIOPSY Left 09/10/2022   [place] LT RADIOACTIVE SEED LOC 09/10/2022 [facility]   BREAST BIOPSY Left 09/10/2022   [place] LT RADIOACTIVE SEED LOC 09/10/2022 [facility]   BREAST EXCISIONAL BIOPSY Left 2010   benign   BREAST LUMPECTOMY WITH RADIOACTIVE SEED AND SENTINEL LYMPH NODE BIOPSY Left 09/11/2022   Procedure: LEFT BREAST LUMPECTOMY WITH RADIOACTIVE SEED;  Surgeon: Harriette Bouillon, MD;  Location: [facility] OR;  Service: General;  Laterality: Left;   CARDIOVERSION  05/2020   in [facility]   CARDIOVERSION N/A 06/21/2021   Procedure: CARDIOVERSION;  Surgeon: Sande Rives, MD;   Location: [facility] ENDOSCOPY;  Service: Cardiovascular;  Laterality: N/A;   CARDIOVERSION N/A 03/26/2022   Procedure: CARDIOVERSION;  Surgeon: Lewayne Bunting, MD;  Location: [facility] ENDOSCOPY;  Service: Cardiovascular;  Laterality: N/A;   CHOLECYSTECTOMY     CHOLECYSTECTOMY, LAPAROSCOPIC  '06   [name]   KNEE ARTHROSCOPY     Left '00 [place]/ Right '08   lumpectomy- remote     benign   RADIOACTIVE SEED GUIDED AXILLARY SENTINEL LYMPH NODE Left 09/11/2022   Procedure: RADIOACTIVE SEED GUIDED LEFT SENTINEL LYMPH NODE BIOPSY;  Surgeon: Harriette Bouillon, MD;  Location: [facility] OR;  Service: General;  Laterality: Left;   TOOTH EXTRACTION     TOTAL KNEE ARTHROPLASTY  02/05/2011   left   Patient Active Problem List   Diagnosis Date Noted   Genetic testing 09/18/2022   Pacemaker [employer] device BiV 09/06/2022   Malignant neoplasm of upper-outer quadrant of left breast in female, estrogen receptor positive (HCC) 08/06/2022   Pure hypercholesterolemia 05/15/2022   Skin cancer 03/07/2022   Closed nondisplaced fracture of distal phalanx of right thumb 02/15/2022   Atrial fibrillation (HCC) 12/04/2021   Cancer (HCC) 05/25/2021   Left bundle branch block 04/12/2021   Dilated cardiomyopathy (HCC) 04/12/2021   Vasculitis (HCC) 02/22/2021   Anxiety state  02/12/2021   Body fluid retention 02/12/2021   Cervical intraepithelial neoplasia 02/12/2021   Insomnia 02/12/2021   Pruritus of vulva 02/12/2021   Leg wound, right 10/03/2020   Bilateral primary osteoarthritis of knee 03/30/2019   Paroxysmal atrial fibrillation (HCC) 04/07/2015   Chronic anticoagulation 04/07/2015   Paroxysmal atrial flutter (HCC) 03/16/2015   Essential hypertension, benign 02/24/2014   Sciatica 12/08/2013   Hyperlipidemia 09/25/2013   Osteopenia 11/19/2012   Low back pain 01/26/2010   Carcinoma in situ of breast 09/04/2009   DEPRESSION, PROLONGED 09/04/2009   OA (osteoarthritis) of knee 09/04/2009   PERIPHERAL EDEMA 09/04/2009   COLONIC  POLYPS, ADENOMATOUS, HX OF 09/04/2009   Malignant tumor of breast (HCC) 06/11/2009    REFERRING PROVIDER: Dr. Harriette Bouillon  REFERRING DIAG: Left breast cancer  THERAPY DIAG:  Unsteadiness on feet  Chronic pain of right knee  Difficulty in walking, not elsewhere classified  Muscle weakness (generalized)  Aftercare following surgery for neoplasm  Abnormal posture  Malignant neoplasm of lower-outer quadrant of left breast of female, estrogen receptor positive (HCC)  Rationale for Evaluation and Treatment: Rehabilitation  ONSET DATE: 09/11/2022  SUBJECTIVE:                                                                                                                                                                                           SUBJECTIVE STATEMENT: I just have pain in my R knee today and it is always there. I have been doing the stretches for my arm.   PERTINENT HISTORY:  Patient was diagnosed on 07/30/2022 with left grade 2 invasive ductal carcinoma breast cancer. She underwent a left lumpectomy and sentinel node biopsy on 09/11/2022 with 2 of her 3 axillary nodes positive. It is ER/PR positive and HER2 negative with a Ki67 of 20%. She has a pacemaker and a previous history of left breast cancer in 2010. She underwent a lumpectomy and sentinel node biopsy (2 negative nodes) followed by tamoxifen. She declined radiation. She has had 4 cardioversions per her report. Also hx of left knee replacement.   PATIENT GOALS:  Reassess how my recovery is going related to arm function, pain, and swelling.  PAIN:  Are you having pain?  Yes - pt did not rate, pt reports tylenol helps, R knee  PRECAUTIONS: Recent Surgery, left UE Lymphedema risk, ICD/Pacemaker  ACTIVITY LEVEL / LEISURE: She is doing some exercises for stretching her shoulder   OBJECTIVE:   PATIENT SURVEYS:  QUICK DASH:    OBSERVATIONS: Left breast and axillary incisions both appear to be well healed. There  is tightness present and scar tissue present under her  incision sites. No edema present.  POSTURE:  Forward head and rounded shoulders  LYMPHEDEMA ASSESSMENT:   UPPER EXTREMITY AROM/PROM:   A/PROM RIGHT   eval    Shoulder extension 42  Shoulder flexion 130  Shoulder abduction 130  Shoulder internal rotation 55  Shoulder external rotation 66                          (Blank rows = not tested)   A/PROM LEFT   eval LEFT 11/01/2022  Shoulder extension 48 43  Shoulder flexion 116 102  Shoulder abduction 118 106  Shoulder internal rotation 37 37  Shoulder external rotation 78 69                          (Blank rows = not tested)   CERVICAL AROM: All within functional limits   UPPER EXTREMITY STRENGTH: Functional  LOWER EXTREMITY STRENGTH: Right hip flexor 4-/5 Right knee extension 4+/5 Right knee flexion 4-/5  Sit to stand 10x without UEs = 40.5 seconds  TUG test  = 17 seconds   LYMPHEDEMA ASSESSMENTS:    LANDMARK RIGHT   eval RIGHT 11/01/2022  10 cm proximal to olecranon process 28 27  Olecranon process 23.7 24.4  10 cm proximal to ulnar styloid process 21.2 21.3  Just proximal to ulnar styloid process 15.1 14.8  Across hand at thumb web space 18 18  At base of 2nd digit 6.1 6  (Blank rows = not tested)   LANDMARK LEFT   eval LEFT 11/01/2022  10 cm proximal to olecranon process 29.4 29.5  Olecranon process 25.7 26.6  10 cm proximal to ulnar styloid process 22.8 21.8  Just proximal to ulnar styloid process 15.7 15.5  Across hand at thumb web space 17.5 17.8  At base of 2nd digit 6 5.9  (Blank rows = not tested)    Surgery type/Date: Left lumpectomy and sentinel node biopsy 09/11/2022 Number of lymph nodes removed: 3 Current/past treatment (chemo, radiation, hormone therapy): none Other symptoms:  Heaviness/tightness No Pain No Pitting edema No Infections No Decreased scar mobility Yes Stemmer sign No  TREATMENT PERFORMED: 11/08/22:  Nustep level 2,  seat at 6, arms at 7, x 10 min, - 541 steps, 97%, 60bpm - has pacemaker In sitting: Seated knee extension with 3 lb ankle wieghts x 10 reps x 2 sets Seated marching with 3 lb ankle weights x 10 x 2 sets Ball squeezes x 10 with 3 sec holds x 2 sets In supine: [place] with v/c to keep core engaged x 10 reps S/L: Clamshells x 10 reps bilaterally Standing:  3 way hip at treadmill with yellow band x 10 reps in each direction with v/c and t/c to keep knee straight throughout, pt had increased difficulty keeping knee straight especially with extension and abduction SLS on air ex in // bars with min assist x 10 reps Tandem walking with 1 x HHA on // bars x 4 on airex with pt able to achieve nearly full tandem stance  11/06/22:  Nustep level 1, seat at 6, arms at 6, 7 min - 541 steps, 97%, 60bpm - has pacemaker Seated knee extension with 2 lb ankle wieghts x 10 reps Seated marching with 2 lb ankle weights x 10  Ball squeezes x 10 with 3 sec holds Standing 3 way hip in // bars with no resistance x 10 reps in each direction with v/c and t/c  to keep knee straight throughout, pt had increased difficulty when weight bearing on R side due to knee pain Step ups on air ex x 10 reps Tandem walking with 1 x HHA on // bars x 4 with pt able to achieve nearly full tandem stance Backwards walking x 2 with 1 HHA and no losses of balance Stepping over 4 orange hurdles x 4 with pt improving each rep and by end was not touching the hurdle with her foot  Heel raises x 10 with HHA Toe raises x 10 with HHA  PATIENT EDUCATION:  Education details: HEP and scar massage Person educated: Patient and Child(ren) Education method: Explanation, Demonstration, Verbal cues, and Handouts Education comprehension: verbalized understanding and returned demonstration  HOME EXERCISE PROGRAM: Access Code: [email or web address] URL: [email or web address] Date: 11/08/2022 Prepared by: Leonette Most  Exercises -  Supine Bridge  - 1 x daily - 7 x weekly - 1-2 sets - 10 reps - 3 sec hold - Supine Active Straight Leg Raise  - 1 x daily - 7 x weekly - 1-2 sets - 10 reps - Clamshell  - 1 x daily - 7 x weekly - 1-2 sets - 10 reps - Standing Hip Abduction with Counter Support  - 1 x daily - 7 x weekly - 1-2 sets - 10 reps - Standing Hip Extension with Counter Support  - 1 x daily - 7 x weekly - 1-2 sets - 10 reps - Standing Hip Flexion with Counter Support  - 1 x daily - 7 x weekly - 1-2 sets - 10 reps - Mini Squat with Counter Support  - 1 x daily - 7 x weekly - 1-2 sets - 10 reps   ASSESSMENT:  CLINICAL IMPRESSION: Continued to progress exercises with increased reps and resistance. Created a home exercise program for LE for pt to begin at home. She did not have any increased pain or fatigue after last session. Will add UE exercises at next session and continue to update HEP as needed.   Pt will benefit from skilled therapeutic intervention to improve on the following deficits: Decreased knowledge of precautions, impaired UE functional use, pain, decreased ROM, postural dysfunction, balance and strength.  PT treatment/interventions: ADL/Self care home management, Therapeutic exercises, Therapeutic activity, Neuromuscular re-education, Balance training, Gait training, Patient/Family education, Self Care, Stair training, Manual lymph drainage, scar mobilization, Manual therapy, and Re-evaluation   GOALS: Goals reviewed with patient? Yes  LONG TERM GOALS:  (STG=LTG)  GOALS Name Target Date  Goal status  1 Pt will demonstrate she has regained full shoulder ROM and function post operatively compared to baselines.  Baseline: 11/29/2022 IN PROGRESS  2 Patient will improve her TUG time to be </= 14 seconds for improved overall mobility and safety. 11/29/2022 INITIAL  3 Patient will demonstrate she can perform sit to stand without use of UE from standard chair height 10 times in </= 34 seconds. 11/29/2022 INITIAL   4 Patient will report she has returned to some time of regular exercise such as water exercise or a stationary bike (limited by her knee) 11/29/2022 INITIAL     PLAN:  PT FREQUENCY/DURATION: 2x/week for 4 weeks  PLAN FOR NEXT SESSION: add UE exercises - pt liked cable machine in the past at the gym, Scar massage to breast/axillary incisions; ROM exercises for left shoulder; balance and leg strengthening exercises in standing (NuStep?) Add new LE exercises to HEP if pt tolerate them well   [facility]  [address], [address]  [place] [address]  [phone]  After Breast Cancer Class It is recommended you attend the ABC class to be educated on lymphedema risk reduction. This class is free of charge and lasts for 1 hour. It is a 1-time class. You will need to download the TEAMS app either on your phone or computer. We will send you a link the night before or the morning of the class. You should be able to click on that link to join the class. This is not a confidential class. You don't have to turn your camera on, but other participants may be able to see your email address. You're scheduled for June 17th at 12:00.  Scar massage You can begin gentle scar massage to you incision sites. Gently place one hand on the incision and move the skin (without sliding on the skin) in various directions. Do this for a few minutes and then you can gently massage either coconut oil or vitamin E cream into the scars.  Compression garment You should continue wearing your compression bra until you feel like you no longer have swelling.  Home exercise Program Continue doing the exercises you were given until you feel like you can do them without feeling any tightness at the end. Start doing the first and last ones on the paper laying flat on your back.  Walking Program Studies show that 30 minutes of walking per day (fast enough to elevate your heart rate) can significantly reduce  the risk of a cancer recurrence. If you can't walk due to other medical reasons, we encourage you to find another activity you could do (like a stationary bike or water exercise).  Posture After breast cancer surgery, people frequently sit with rounded shoulders posture because it puts their incisions on slack and feels better. If you sit like this and scar tissue forms in that position, you can become very tight and have pain sitting or standing with good posture. Try to be aware of your posture and sit and stand up tall to heal properly.  Bethann Punches, [place] 11/08/22 11:51 AM

## 2022-11-08 NOTE — Progress Notes (Signed)
[facility]   [phone] [phone]   Clinic Follow up Note   Patient Care Team: Copland, Gwenlyn Found, MD as PCP - General (Family Medicine) Lanier Prude, MD as PCP - Electrophysiology (Cardiology) Valeria Batman, MD (Inactive) (Orthopedic Surgery) Annamaria Helling, MD (Obstetrics and Gynecology) Harriette Bouillon, MD (General Surgery) Joneen Caraway, PA-C as Physician Assistant (Dermatology) Harriette Bouillon, MD as Consulting Physician (General Surgery) Malachy Mood, MD as Consulting Physician (Hematology) Antony Blackbird, MD as Consulting Physician (Radiation Oncology) Donnelly Angelica, RN as Oncology Nurse Navigator Pershing Proud, RN as Oncology Nurse Navigator  Date of Service:  11/09/2022  CHIEF COMPLAINT: f/u of left breast cancer   CURRENT THERAPY:   Tamoxifen starting 11/2022  ASSESSMENT:  Sheila Schmidt is a 87 y.o. female with   Malignant neoplasm of upper-outer quadrant of left breast in female, estrogen receptor positive (HCC) -pT1bN1aM0, stage IA, ER+/PR+/HER2-, G2 -She was diagnosed in February 2024 -She underwent left breast lumpectomy and sentinel lymph node biopsy, I discussed her surgical findings with patient in detail.  Surgical margins were negative -Adjuvant radiation was recommended and offered, however due to her pacemaker on the same side of her breast cancer, the ultimate decision was not to proceed with adjuvant radiation due to her pacemaker and her advanced age. -Due to her advanced age, she is not a candidate for chemotherapy, so Oncotype was not obtained. --Giving her strongly ER and PR positivity of the tumor cells, and her pre-menopause status, I recommend adjuvant endocrine therapy with tamoxifen. The potential side effects, which includes but not limited to, hot flash, skin and vaginal dryness, slightly increased risk of cardiovascular disease and cataract, small risk of thrombosis and endometrial cancer, were  discussed with her in great details. Preventive strategies for thrombosis, such as being physically active, using compression stocks, avoid cigarette smoking, etc., were reviewed with her. I also recommend her to follow-up with her gynecologist once a year, and watch for vaginal spotting or bleeding, as a clinically sign of endometrial cancer, etc. She voiced good understanding, and agrees to proceed.  -We also discussed the option of aromatase inhibitor, due to her ostial pia and arthritis, she probably will tolerate tamoxifen better. -Due to the drug drug interaction between tamoxifen and the Prozac, I will switch her Prozac to Effexor, -We also discussed cancer surveillance, and what to watch at home.     PLAN: -discuss antiestrogen therapy and it benefits and side effects Tamoxifen and AI - I recommend Tamoxifen 1 tablet daily, she will start next week. -pt agree to Tamoxifen - I will reach out to Dr.Copland for medication change (PROZAC), I call in 10 mg and gradually taper off in 2 weeks, and she will start Effexor 37.5mg  daily, which I called in -Survivorship in 3 months with NP Mardella Layman  - pt is still interested in radiation,I will f/u with Dr. Roselind Messier   SUMMARY OF ONCOLOGIC HISTORY: Oncology History Overview Note   Cancer Staging  Malignant neoplasm of upper-outer quadrant of left breast in female, estrogen receptor positive (HCC) Staging form: Breast, AJCC 8th Edition - Clinical stage from 07/30/2022: Stage IB (cT1b, cN1, cM0, G2, ER+, PR+, HER2-) - Signed by Malachy Mood, MD on 08/07/2022 Stage prefix: Initial diagnosis Histologic grading system: 3 grade system - Pathologic stage from 09/11/2022: Stage IA (pT1b, pN1a, cM0, G2, ER+, PR+, HER2-) - Signed by Malachy Mood, MD on 09/24/2022 Stage prefix: Initial diagnosis Histologic grading system: 3 grade system Residual tumor (R):  R0 - None     Malignant neoplasm of upper-outer quadrant of left breast in female, estrogen receptor positive  (HCC)  07/30/2022 Cancer Staging   Staging form: Breast, AJCC 8th Edition - Clinical stage from 07/30/2022: Stage IB (cT1b, cN1, cM0, G2, ER+, PR+, HER2-) - Signed by Malachy Mood, MD on 08/07/2022 Stage prefix: Initial diagnosis Histologic grading system: 3 grade system   08/06/2022 Initial Diagnosis   Malignant neoplasm of upper-outer quadrant of left breast in female, estrogen receptor positive (HCC)   08/23/2022 Genetic Testing   Negative [employer] Common Hereditary Cancers +RNA Panel.  VUS detected in PMS2 at c.791A>C (p.His264Pro) and POLE at c.444G>C (p.Leu148Phe).  Report date is 08/23/2022.    The [employer] Common Hereditary Cancers + RNA Panel includes sequencing, deletion/duplication, and RNA analysis of the following 48 genes: APC, ATM, AXIN2, BAP1, BARD1, BMPR1A, BRCA1, BRCA2, BRIP1, CDH1, CDK4*, CDKN2A*, CHEK2, CTNNA1, DICER1, EPCAM* (del/dup only), FH, GREM1* (promoter dup analysis only), HOXB13*, KIT*, MBD4*, MEN1, MLH1, MSH2, MSH3, MSH6, MUTYH, NF1, NTHL1, PALB2, PDGFRA*, PMS2, POLD1, POLE, PTEN, RAD51C, RAD51D, SDHA (sequencing only), SDHB, SDHC, SDHD, SMAD4, SMARCA4, STK11, TP53, TSC1, TSC2, VHL.  *Genes without RNA analysis.    09/11/2022 Cancer Staging   Staging form: Breast, AJCC 8th Edition - Pathologic stage from 09/11/2022: Stage IA (pT1b, pN1a, cM0, G2, ER+, PR+, HER2-) - Signed by Malachy Mood, MD on 09/24/2022 Stage prefix: Initial diagnosis Histologic grading system: 3 grade system Residual tumor (R): R0 - None   09/11/2022 Pathology Results     FINAL MICROSCOPIC DIAGNOSIS:  A. BREAST, LEFT, LUMPECTOMY: Invasive ductal carcinoma, 0.8 cm, grade II/III Ductal carcinoma in situ: Not identified Margins, invasive: Negative     Closest, invasive: Posterior at 6 mm Margins, DCIS: N/A     Closest, DCIS: N/A Lymphovascular invasion: Suspicious for small arteriolar invasion Prognostic markers:  ER positive, PR positive, Her2 negative, Ki-67 20% % Other: N/A See oncology table  B.  LYMPH NODE, LEFT AXILLA, SENTINEL, EXCISION: -  1 lymph node positive for malignancy, 18 mm in greatest dimension, with extracapsular extension (1/1).  C. LYMPH NODE, LEFT AXILLA, SENTINEL, EXCISION: -  1 lymph node positive for malignancy, 4 mm in greatest dimension (1/1).  D. LYMPH NODE, LEFT AXILLA, SENTINEL, EXCISION: -  1 lymph node negative for malignancy (0/1).  E. BREAST, LEFT ADDITIONAL MEDIAL MARGIN, EXCISION: -  Benign breast tissue, negative for malignancy. -  New additional margin thickness 7 mm.  F. BREAST, LEFT ADDITIONAL SUPERIOR MARGIN, EXCISION: -  Benign breast tissue, negative for malignancy. -  New additional margin thickness 13 mm.  G. BREAST, LEFT ADDITIONAL INFERIOR MARGIN, EXCISION: -  Benign breast tissue, negative for malignancy. -  New additional margin thickness 6 mm  H. BREAST, LEFT ADDITIONAL LATERAL MARGIN, EXCISION: -  Benign breast tissue, negative for malignancy. -  New additional margin thickness 5 mm.       INTERVAL HISTORY:  Sheila Schmidt is here for a follow up of left breast cancer , She was last seen by me on 08/08/2022. She presents to the clinic accompanied by daughter. Pt state that she is doing well after breast surgery. Pt started physical therapy and that is going well. Pt state that she can't proceed with radiation due to location of PACE maker.      All other systems were reviewed with the patient and are negative.  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   Dysrhythmia    Afib   Heart murmur    Hx of adenomatous colonic polyps    Hyperlipidemia    Presence of permanent cardiac pacemaker     SURGICAL HISTORY: Past Surgical History:  Procedure Laterality Date   ATRIAL FLUTTER ABLATION     BIV PACEMAKER INSERTION CRT-P N/A 12/04/2021   Procedure: BIV PACEMAKER  INSERTION CRT-P;  Surgeon: Lanier Prude, MD;  Location[facility]VASIVE CV LAB;  Service: Cardiovascular;  Laterality: N/A;   BREAST BIOPSY Left 07/30/2022   [place] LT BREAST BX W LOC DEV 1ST LESION IMG BX SPEC US GUIDE 07/30/2022 [facility]MOGR[facility]   BREAST BIOPSY Left 09/10/2022   [place] LT RADIOACTIVE SEED LOC 09/10/2022 [facility] MAMMOGRAPHY   BREAST BIOPSY Left 09/10/2022   [place] LT RADIOACTIVE SEED LOC 09/10/2022 [facility] MAMMOGRAPHY   BREAST EXCISIONAL BIOPSY Left 2010   benign   BREAST LUMPECTOMY WITH RADIOACTIVE SEED AND SENTINEL LYMPH NODE BIOPSY Left 09/11/2022   Procedure: LEFT BREAST LUMPECTOMY WITH RADIOACTIVE SEED;  Surgeon: Harriette Bouillon, MD;  Location: [facility] OR;  Service: General;  Laterality: Left;   CARDIOVERSION  05/2020   in [facility]   CARDIOVERSION N/A 06/21/2021   Procedure: CARDIOVERSION;  Surgeon: Sande Rives, MD;  Location: [facility] ENDOSCOPY;  Service: Cardiovascular;  Laterality: N/A;   CARDIOVERSION N/A 03/26/2022   Procedure: CARDIOVERSION;  Surgeon: Lewayne Bunting, MD;  Location: [facility] ENDOSCOPY;  Service: Cardiovascular;  Laterality: N/A;   CHOLECYSTECTOMY     CHOLECYSTECTOMY, LAPAROSCOPIC  '06   [name]   KNEE ARTHROSCOPY     Left '00 [place]/ Right '08   lumpectomy- remote     benign   RADIOACTIVE SEED GUIDED AXILLARY SENTINEL LYMPH NODE Left 09/11/2022   Procedure: RADIOACTIVE SEED GUIDED LEFT SENTINEL LYMPH NODE BIOPSY;  Surgeon: Harriette Bouillon, MD;  Location: [facility] OR;  Service: General;  Laterality: Left;   TOOTH EXTRACTION     TOTAL KNEE ARTHROPLASTY  02/05/2011   left    I have reviewed the social history and family history with the patient and they are unchanged from previous note.  ALLERGIES:  has No Known Allergies.  MEDICATIONS:  Current Outpatient Medications  Medication Sig Dispense Refill   FLUoxetine (PROZAC) 10 MG capsule Take 1 capsule (10 mg total) by mouth daily. Take for 7 days then every other day for a week then stop 10 capsule 0   tamoxifen (NOLVADEX) 20 MG  tablet Take 1 tablet (20 mg total) by mouth daily. 30 tablet 5   venlafaxine XR (EFFEXOR-XR) 37.5 MG 24 hr capsule Take 1 capsule (37.5 mg total) by mouth daily with breakfast. 30 capsule 2   acetaminophen (TYLENOL) 500 MG tablet Take 500-1,000 mg by mouth every 8 (eight) hours as needed for moderate pain.     acetaminophen-codeine (TYLENOL #3) 300-30 MG tablet Take 1 tablet by mouth every 6 (six) hours as needed for moderate pain. 30 tablet 0   amiodarone (PACERONE) 200 MG tablet Take 1 tablet (200 mg total) by mouth daily. 90 tablet 2   atorvastatin (LIPITOR) 20 MG tablet TAKE 1 TABLET BY MOUTH DAILY (Patient taking differently: Take 20 mg by mouth at bedtime.) 90 tablet 3   Calcium Carbonate-Vit D-Min (CALTRATE 600+D PLUS MINERALS) 600-800 MG-UNIT CHEW Chew 1 each by mouth daily.     Cholecalciferol 50 MCG (2000 UT) TABS Take 2,000  Units by mouth daily.     ENTRESTO 24-26 MG TAKE 1 TABLET BY MOUTH TWICE  DAILY 180 tablet 3   famotidine (PEPCID) 40 MG tablet Take 40 mg by mouth daily as needed for heartburn or indigestion.     furosemide (LASIX) 20 MG tablet Start with a 1/2 tablet daily as needed for swelling, can go up to a whole tablet/ 20 mg if needed 30 tablet 3   melatonin 5 MG TABS Take 1.25 mg by mouth at bedtime as needed (sleep).     vitamin B-12 (CYANOCOBALAMIN) 100 MCG tablet Take 100 mcg by mouth daily.     XARELTO 20 MG TABS tablet TAKE 1 TABLET BY MOUTH DAILY  WITH SUPPER 90 tablet 3   No current facility-administered medications for this visit.    PHYSICAL EXAMINATION: ECOG PERFORMANCE STATUS: 1 - Symptomatic but completely ambulatory  Vitals:   11/09/22 0851  BP: (!) 143/58  Pulse: 60  Resp: 16  Temp: 97.9 F (36.6 C)  SpO2: 98%   Wt Readings from Last 3 Encounters:  11/09/22 176 lb 12.8 oz (80.2 kg)  10/31/22 176 lb 3.2 oz (79.9 kg)  10/04/22 182 lb 6.4 oz (82.7 kg)     GENERAL:alert, no distress and comfortable SKIN: skin color normal, no rashes or  significant lesions EYES: normal, Conjunctiva are pink and non-injected, sclera clear  NEURO: alert & oriented x 3 with fluent speech BREAST: LT breast no swelling LABORATORY DATA:  I have reviewed the data as listed    Latest Ref Rng & Units 09/03/2022   10:55 AM 08/08/2022   12:16 PM 03/21/2022    3:02 PM  CBC  WBC 4.0 - 10.5 K/uL 7.9  10.3  10.0   Hemoglobin 12.0 - 15.0 g/dL 62.1  30.8  65.7   Hematocrit 36.0 - 46.0 % 40.8  42.3  42.0   Platelets 150 - 400 K/uL 181  233  211         Latest Ref Rng & Units 09/03/2022   10:55 AM 08/08/2022   12:16 PM 06/18/2022   10:22 AM  CMP  Glucose 70 - 99 mg/dL 92  76  846   BUN 8 - 23 mg/dL 14  16  15    Creatinine 0.44 - 1.00 mg/dL 9.62  9.52  8.41   Sodium 135 - 145 mmol/L 135  140  142   Potassium 3.5 - 5.1 mmol/L 4.3  4.0  3.6   Chloride 98 - 111 mmol/L 102  106  105   CO2 22 - 32 mmol/L 26  29  29    Calcium 8.9 - 10.3 mg/dL 9.2  9.2  9.0   Total Protein 6.5 - 8.1 g/dL  6.4    Total Bilirubin 0.3 - 1.2 mg/dL  1.1    Alkaline Phos 38 - 126 U/L  56    AST 15 - 41 U/L  18    ALT 0 - 44 U/L  17        RADIOGRAPHIC STUDIES: I have personally reviewed the radiological images as listed and agreed with the findings in the report. No results found.    No orders of the defined types were placed in this encounter.  All questions were answered. The patient knows to call the clinic with any problems, questions or concerns. No barriers to learning was detected. The total time spent in the appointment was 30 minutes.     Malachy Mood, MD 11/09/2022   I, Monica Martinez,  CMA, am acting as scribe for Malachy Mood, MD.   I have reviewed the above documentation for accuracy and completeness, and I agree with the above.

## 2022-11-08 NOTE — Assessment & Plan Note (Signed)
-  pT1bN1aM0, stage IA, ER+/PR+/HER2-, G2 -She was diagnosed in February 2024 -She underwent left breast lumpectomy and sentinel lymph node biopsy, I discussed her surgical findings with patient in detail.  Surgical margins were negative -Adjuvant radiation was recommended and offered, however due to her pacemaker on the same side of her breast cancer, the ultimate decision was not to proceed with adjuvant radiation due to her pacemaker and her advanced age. -Due to her advanced age, she is not a candidate for chemotherapy, so Oncotype was not obtained. --Given the strong ER and PR positivity, I do recommend adjuvant aromatase inhibitor to reduce her risk of cancer recurrence,  The potential benefit and side effects, which includes but not limited to, hot flash, skin and vaginal dryness, metabolic changes ( increased blood glucose, cholesterol, weight, etc.), slightly in increased risk of cardiovascular disease, cataracts, muscular and joint discomfort, osteopenia and osteoporosis, etc, were discussed with her in great details. She is interested, and we'll start next week  -We also discussed cancer surveillance, and what to watch at home.

## 2022-11-09 ENCOUNTER — Encounter: Payer: Self-pay | Admitting: Hematology

## 2022-11-09 ENCOUNTER — Inpatient Hospital Stay: Payer: Medicare Other | Attending: Hematology | Admitting: Hematology

## 2022-11-09 VITALS — BP 143/58 | HR 60 | Temp 97.9°F | Resp 16 | Ht 62.0 in | Wt 176.8 lb

## 2022-11-09 DIAGNOSIS — C50412 Malignant neoplasm of upper-outer quadrant of left female breast: Secondary | ICD-10-CM | POA: Insufficient documentation

## 2022-11-09 DIAGNOSIS — Z79810 Long term (current) use of selective estrogen receptor modulators (SERMs): Secondary | ICD-10-CM | POA: Insufficient documentation

## 2022-11-09 DIAGNOSIS — Z170 Estrogen receptor positive status [ER+]: Secondary | ICD-10-CM | POA: Insufficient documentation

## 2022-11-09 MED ORDER — FLUOXETINE HCL 10 MG PO CAPS: 10.0000 mg | ORAL_CAPSULE | Freq: Every day | ORAL | 0 refills | Status: DC

## 2022-11-09 MED ORDER — TAMOXIFEN CITRATE 20 MG PO TABS: 20.0000 mg | ORAL_TABLET | Freq: Every day | ORAL | 5 refills | Status: DC

## 2022-11-09 MED ORDER — VENLAFAXINE HCL ER 37.5 MG PO CP24: 37.5000 mg | ORAL_CAPSULE | Freq: Every day | ORAL | 2 refills | Status: DC

## 2022-11-12 ENCOUNTER — Encounter: Payer: Self-pay | Admitting: *Deleted

## 2022-11-12 DIAGNOSIS — C50412 Malignant neoplasm of upper-outer quadrant of left female breast: Secondary | ICD-10-CM

## 2022-11-13 ENCOUNTER — Ambulatory Visit: Payer: Medicare Other

## 2022-11-15 ENCOUNTER — Encounter: Payer: Self-pay | Admitting: Rehabilitation

## 2022-11-15 ENCOUNTER — Ambulatory Visit: Payer: Medicare Other | Attending: Surgery | Admitting: Rehabilitation

## 2022-11-15 DIAGNOSIS — G8929 Other chronic pain: Secondary | ICD-10-CM | POA: Diagnosis present

## 2022-11-15 DIAGNOSIS — R2681 Unsteadiness on feet: Secondary | ICD-10-CM | POA: Insufficient documentation

## 2022-11-15 DIAGNOSIS — Z483 Aftercare following surgery for neoplasm: Secondary | ICD-10-CM | POA: Diagnosis present

## 2022-11-15 DIAGNOSIS — R293 Abnormal posture: Secondary | ICD-10-CM | POA: Diagnosis present

## 2022-11-15 DIAGNOSIS — Z170 Estrogen receptor positive status [ER+]: Secondary | ICD-10-CM | POA: Insufficient documentation

## 2022-11-15 DIAGNOSIS — M25561 Pain in right knee: Secondary | ICD-10-CM | POA: Diagnosis present

## 2022-11-15 DIAGNOSIS — M6281 Muscle weakness (generalized): Secondary | ICD-10-CM | POA: Diagnosis present

## 2022-11-15 DIAGNOSIS — R262 Difficulty in walking, not elsewhere classified: Secondary | ICD-10-CM | POA: Insufficient documentation

## 2022-11-15 DIAGNOSIS — C50512 Malignant neoplasm of lower-outer quadrant of left female breast: Secondary | ICD-10-CM | POA: Insufficient documentation

## 2022-11-15 NOTE — Therapy (Signed)
OUTPATIENT PHYSICAL THERAPY BREAST CANCER POST OP FOLLOW UP   Patient Name: Sheila Schmidt MRN: [id number] DOB:26-Sep-1935, 87 y.o., female Today's Date: 11/15/2022  END OF SESSION:  PT End of Session - 11/15/22 1431     Visit Number 5    Number of Visits 10    Date for PT Re-Evaluation 11/29/22    PT Start Time 1100    PT Stop Time 1150    PT Time Calculation (min) 50 min    Activity Tolerance Patient tolerated treatment well    Behavior During Therapy WFL for tasks assessed/performed                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   Dysrhythmia    Afib   Heart murmur    Hx of adenomatous colonic polyps    Hyperlipidemia    Presence of permanent cardiac pacemaker    Past Surgical History:  Procedure Laterality Date   ATRIAL FLUTTER ABLATION     BIV PACEMAKER INSERTION CRT-P N/A 12/04/2021   Procedure: BIV PACEMAKER INSERTION CRT-P;  Surgeon: Lanier Prude, MD;  Location: [facility];  Service: Cardiovascular;  Laterality: N/A;   BREAST BIOPSY Left 07/30/2022   [place] LT BREAST BX W LOC DEV 1ST LESION IMG BX SPEC US GUIDE 07/30/2022 [facility]   BREAST BIOPSY Left 09/10/2022   [place] LT RADIOACTIVE SEED LOC 09/10/2022 [facility]   BREAST BIOPSY Left 09/10/2022   [place] LT RADIOACTIVE SEED LOC 09/10/2022 [facility]   BREAST EXCISIONAL BIOPSY Left 2010   benign   BREAST LUMPECTOMY WITH RADIOACTIVE SEED AND SENTINEL LYMPH NODE BIOPSY Left 09/11/2022   Procedure: LEFT BREAST LUMPECTOMY WITH RADIOACTIVE SEED;  Surgeon: Harriette Bouillon, MD;  Location: [facility] OR;  Service: General;  Laterality: Left;   CARDIOVERSION  05/2020   in [facility]   CARDIOVERSION N/A 06/21/2021   Procedure: CARDIOVERSION;  Surgeon: Sande Rives, MD;  Location: [facility] ENDOSCOPY;  Service: Cardiovascular;  Laterality: N/A;   CARDIOVERSION N/A  03/26/2022   Procedure: CARDIOVERSION;  Surgeon: Lewayne Bunting, MD;  Location: [facility] ENDOSCOPY;  Service: Cardiovascular;  Laterality: N/A;   CHOLECYSTECTOMY     CHOLECYSTECTOMY, LAPAROSCOPIC  '06   [name]   KNEE ARTHROSCOPY     Left '00 [place]/ Right '08   lumpectomy- remote     benign   RADIOACTIVE SEED GUIDED AXILLARY SENTINEL LYMPH NODE Left 09/11/2022   Procedure: RADIOACTIVE SEED GUIDED LEFT SENTINEL LYMPH NODE BIOPSY;  Surgeon: Harriette Bouillon, MD;  Location: [facility] OR;  Service: General;  Laterality: Left;   TOOTH EXTRACTION     TOTAL KNEE ARTHROPLASTY  02/05/2011   left   Patient Active Problem List   Diagnosis Date Noted   Genetic testing 09/18/2022   Pacemaker [employer] device BiV 09/06/2022   Malignant neoplasm of upper-outer quadrant of left breast in female, estrogen receptor positive (HCC) 08/06/2022   Pure hypercholesterolemia 05/15/2022   Skin cancer 03/07/2022   Closed nondisplaced fracture of distal phalanx of right thumb 02/15/2022   Atrial fibrillation (HCC) 12/04/2021   Cancer (HCC) 05/25/2021   Left bundle branch block 04/12/2021   Dilated cardiomyopathy (HCC) 04/12/2021   Vasculitis (HCC) 02/22/2021   Anxiety state 02/12/2021   Body fluid retention 02/12/2021   Cervical intraepithelial neoplasia 02/12/2021  Insomnia 02/12/2021   Pruritus of vulva 02/12/2021   Leg wound, right 10/03/2020   Bilateral primary osteoarthritis of knee 03/30/2019   Paroxysmal atrial fibrillation (HCC) 04/07/2015   Chronic anticoagulation 04/07/2015   Paroxysmal atrial flutter (HCC) 03/16/2015   Essential hypertension, benign 02/24/2014   Sciatica 12/08/2013   Hyperlipidemia 09/25/2013   Osteopenia 11/19/2012   Low back pain 01/26/2010   Carcinoma in situ of breast 09/04/2009   DEPRESSION, PROLONGED 09/04/2009   OA (osteoarthritis) of knee 09/04/2009   PERIPHERAL EDEMA 09/04/2009   COLONIC POLYPS, ADENOMATOUS, HX OF 09/04/2009   Malignant tumor of breast (HCC) 06/11/2009     REFERRING PROVIDER: Dr. Harriette Bouillon  REFERRING DIAG: Left breast cancer  THERAPY DIAG:  Unsteadiness on feet  Chronic pain of right knee  Muscle weakness (generalized)  Difficulty in walking, not elsewhere classified  Aftercare following surgery for neoplasm  Abnormal posture  Malignant neoplasm of lower-outer quadrant of left breast of female, estrogen receptor positive (HCC)  Rationale for Evaluation and Treatment: Rehabilitation  ONSET DATE: 09/11/2022  SUBJECTIVE:                                                                                                                                                                                           SUBJECTIVE STATEMENT: Overall it was okay after last time.  Its hurting right now (the knee)  PERTINENT HISTORY:  Patient was diagnosed on 07/30/2022 with left grade 2 invasive ductal carcinoma breast cancer. She underwent a left lumpectomy and sentinel node biopsy on 09/11/2022 with 2 of her 3 axillary nodes positive. It is ER/PR positive and HER2 negative with a Ki67 of 20%. She has a pacemaker and a previous history of left breast cancer in 2010. She underwent a lumpectomy and sentinel node biopsy (2 negative nodes) followed by tamoxifen. She declined radiation. She has had 4 cardioversions per her report. Also hx of left knee replacement.   PATIENT GOALS:  Reassess how my recovery is going related to arm function, pain, and swelling.  PAIN:  Are you having pain?  Yes - it averages 6/10, pt reports tylenol helps, R knee  medial side   PRECAUTIONS: Recent Surgery, left UE Lymphedema risk, ICD/Pacemaker  ACTIVITY LEVEL / LEISURE: She is doing some exercises for stretching her shoulder   OBJECTIVE:  OBSERVATIONS: Left breast and axillary incisions both appear to be well healed. There is tightness present and scar tissue present under her incision sites. No edema present.  POSTURE:  Forward head and rounded  shoulders  LYMPHEDEMA ASSESSMENT:   UPPER EXTREMITY AROM/PROM:   A/PROM RIGHT   eval  Shoulder extension 42  Shoulder flexion 130  Shoulder abduction 130  Shoulder internal rotation 55  Shoulder external rotation 66                          (Blank rows = not tested)   A/PROM LEFT   eval LEFT 11/01/2022 11/15/22  Shoulder extension 48 43   Shoulder flexion 116 102 120  Shoulder abduction 118 106 130  Shoulder internal rotation 37 37   Shoulder external rotation 78 69                           (Blank rows = not tested)   CERVICAL AROM: All within functional limits   UPPER EXTREMITY STRENGTH: Functional  LOWER EXTREMITY STRENGTH: Right hip flexor 4-/5 Right knee extension 4+/5 Right knee flexion 4-/5  Sit to stand 10x without UEs = 40.5 seconds  TUG test  = 17 seconds   LYMPHEDEMA ASSESSMENTS:    LANDMARK RIGHT   eval RIGHT 11/01/2022  10 cm proximal to olecranon process 28 27  Olecranon process 23.7 24.4  10 cm proximal to ulnar styloid process 21.2 21.3  Just proximal to ulnar styloid process 15.1 14.8  Across hand at thumb web space 18 18  At base of 2nd digit 6.1 6  (Blank rows = not tested)   LANDMARK LEFT   eval LEFT 11/01/2022  10 cm proximal to olecranon process 29.4 29.5  Olecranon process 25.7 26.6  10 cm proximal to ulnar styloid process 22.8 21.8  Just proximal to ulnar styloid process 15.7 15.5  Across hand at thumb web space 17.5 17.8  At base of 2nd digit 6 5.9  (Blank rows = not tested)   Surgery type/Date: Left lumpectomy and sentinel node biopsy 09/11/2022 Number of lymph nodes removed: 3 Current/past treatment (chemo, radiation, hormone therapy): none Other symptoms:  Heaviness/tightness No Pain No Pitting edema No Infections No Decreased scar mobility Yes Stemmer sign No  TREATMENT PERFORMED: 11/15/22:  Remeasured arms which have returned to prior measurements Nustep level 2, seat at 6, arms at 7, x 10 min, - 541 steps, 97%,  60bpm - has pacemaker In sitting: Seated knee extension with 3 lb ankle wieghts x 10 reps x 2 sets Seated marching with 3 lb ankle weights x 10 x 2 sets Ball squeezes x 10 with 3 sec holds x 2 sets In supine: Bridging with v/c to keep core engaged x 10 reps with vcs to stop if back starts to hurt  Alternating punch 2# 2x10  S/L: Clamshells x 10 reps bilaterally Standing:  3 way hip at treadmill with yellow band x 10 reps in each direction with v/c and t/c to keep knee straight throughout, pt had increased difficulty keeping knee straight especially with extension and abduction Yellow row x 15 Tandem walking with 1 x HHA on // bars x 4 on airex with pt able to achieve nearly full tandem stance  11/08/22:  Nustep level 2, seat at 6, arms at 7, x 10 min, - 541 steps, 97%, 60bpm - has pacemaker In sitting: Seated knee extension with 3 lb ankle wieghts x 10 reps x 2 sets Seated marching with 3 lb ankle weights x 10 x 2 sets Ball squeezes x 10 with 3 sec holds x 2 sets In supine: Bridigng with v/c to keep core engaged x 10 reps S/L: Clamshells x 10 reps bilaterally Standing:  3  way hip at treadmill with yellow band x 10 reps in each direction with v/c and t/c to keep knee straight throughout, pt had increased difficulty keeping knee straight especially with extension and abduction SLS on air ex in // bars with min assist x 10 reps Tandem walking with 1 x HHA on // bars x 4 on airex with pt able to achieve nearly full tandem stance  11/06/22:  Nustep level 1, seat at 6, arms at 6, 7 min - 541 steps, 97%, 60bpm - has pacemaker Seated knee extension with 2 lb ankle wieghts x 10 reps Seated marching with 2 lb ankle weights x 10  Ball squeezes x 10 with 3 sec holds Standing 3 way hip in // bars with no resistance x 10 reps in each direction with v/c and t/c to keep knee straight throughout, pt had increased difficulty when weight bearing on R side due to knee pain Step ups on air ex x 10  reps Tandem walking with 1 x HHA on // bars x 4 with pt able to achieve nearly full tandem stance Backwards walking x 2 with 1 HHA and no losses of balance Stepping over 4 orange hurdles x 4 with pt improving each rep and by end was not touching the hurdle with her foot  Heel raises x 10 with HHA Toe raises x 10 with HHA  PATIENT EDUCATION:  Education details: HEP and scar massage Person educated: Patient and Child(ren) Education method: Explanation, Demonstration, Verbal cues, and Handouts Education comprehension: verbalized understanding and returned demonstration  HOME EXERCISE PROGRAM: Access Code: [email or web address] URL: [email or web address] Date: 11/08/2022 Prepared by: Leonette Most  Exercises - Supine Bridge  - 1 x daily - 7 x weekly - 1-2 sets - 10 reps - 3 sec hold - Supine Active Straight Leg Raise  - 1 x daily - 7 x weekly - 1-2 sets - 10 reps - Clamshell  - 1 x daily - 7 x weekly - 1-2 sets - 10 reps - Standing Hip Abduction with Counter Support  - 1 x daily - 7 x weekly - 1-2 sets - 10 reps - Standing Hip Extension with Counter Support  - 1 x daily - 7 x weekly - 1-2 sets - 10 reps - Standing Hip Flexion with Counter Support  - 1 x daily - 7 x weekly - 1-2 sets - 10 reps - Mini Squat with Counter Support  - 1 x daily - 7 x weekly - 1-2 sets - 10 reps   ASSESSMENT:  CLINICAL IMPRESSION: No sig changes to TE today except for adding arm motions.  Pt has now met her AROM goal for the UE. Pt would overall like more challenge.    Pt will benefit from skilled therapeutic intervention to improve on the following deficits: Decreased knowledge of precautions, impaired UE functional use, pain, decreased ROM, postural dysfunction, balance and strength.  PT treatment/interventions: ADL/Self care home management, Therapeutic exercises, Therapeutic activity, Neuromuscular re-education, Balance training, Gait training, Patient/Family education, Self Care, Stair  training, Manual lymph drainage, scar mobilization, Manual therapy, and Re-evaluation   GOALS: Goals reviewed with patient? Yes  LONG TERM GOALS:  (STG=LTG)  GOALS Name Target Date  Goal status  1 Pt will demonstrate she has regained full shoulder ROM and function post operatively compared to baselines.  Baseline: 11/29/2022 MET  2 Patient will improve her TUG time to be </= 14 seconds for improved overall mobility and safety. 11/29/2022 INITIAL  3 Patient will demonstrate  she can perform sit to stand without use of UE from standard chair height 10 times in </= 34 seconds. 11/29/2022 INITIAL  4 Patient will report she has returned to some time of regular exercise such as water exercise or a stationary bike (limited by her knee) 11/29/2022 INITIAL     PLAN:  PT FREQUENCY/DURATION: 2x/week for 4 weeks  PLAN FOR NEXT SESSION: add UE exercises - pt liked cable machine in the past at the gym, Scar massage to breast/axillary incisions; ROM exercises for left shoulder; balance and leg strengthening exercises in standing (NuStep?) Add new LE exercises to HEP if pt tolerate them well   [facility]  [address], [address]  [place] [address]  [phone]  After Breast Cancer Class It is recommended you attend the ABC class to be educated on lymphedema risk reduction. This class is free of charge and lasts for 1 hour. It is a 1-time class. You will need to download the TEAMS app either on your phone or computer. We will send you a link the night before or the morning of the class. You should be able to click on that link to join the class. This is not a confidential class. You don't have to turn your camera on, but other participants may be able to see your email address. You're scheduled for June 17th at 12:00.  Scar massage You can begin gentle scar massage to you incision sites. Gently place one hand on the incision and move the skin (without sliding on the skin) in  various directions. Do this for a few minutes and then you can gently massage either coconut oil or vitamin E cream into the scars.  Compression garment You should continue wearing your compression bra until you feel like you no longer have swelling.  Home exercise Program Continue doing the exercises you were given until you feel like you can do them without feeling any tightness at the end. Start doing the first and last ones on the paper laying flat on your back.  Walking Program Studies show that 30 minutes of walking per day (fast enough to elevate your heart rate) can significantly reduce the risk of a cancer recurrence. If you can't walk due to other medical reasons, we encourage you to find another activity you could do (like a stationary bike or water exercise).  Posture After breast cancer surgery, people frequently sit with rounded shoulders posture because it puts their incisions on slack and feels better. If you sit like this and scar tissue forms in that position, you can become very tight and have pain sitting or standing with good posture. Try to be aware of your posture and sit and stand up tall to heal properly.  Bethann Punches, [place] 11/15/22 2:31 PM

## 2022-11-19 ENCOUNTER — Ambulatory Visit: Payer: Medicare Other | Admitting: Hematology

## 2022-11-20 ENCOUNTER — Ambulatory Visit: Payer: Medicare Other | Admitting: Rehabilitation

## 2022-11-20 ENCOUNTER — Encounter: Payer: Self-pay | Admitting: Rehabilitation

## 2022-11-20 DIAGNOSIS — M6281 Muscle weakness (generalized): Secondary | ICD-10-CM

## 2022-11-20 DIAGNOSIS — R293 Abnormal posture: Secondary | ICD-10-CM

## 2022-11-20 DIAGNOSIS — Z483 Aftercare following surgery for neoplasm: Secondary | ICD-10-CM

## 2022-11-20 DIAGNOSIS — G8929 Other chronic pain: Secondary | ICD-10-CM

## 2022-11-20 DIAGNOSIS — R262 Difficulty in walking, not elsewhere classified: Secondary | ICD-10-CM

## 2022-11-20 DIAGNOSIS — Z170 Estrogen receptor positive status [ER+]: Secondary | ICD-10-CM

## 2022-11-20 DIAGNOSIS — R2681 Unsteadiness on feet: Secondary | ICD-10-CM

## 2022-11-20 NOTE — Therapy (Signed)
OUTPATIENT PHYSICAL THERAPY BREAST CANCER TREATMENT   Patient Name: Sheila Schmidt MRN: [id number] DOB:1935/11/02, 87 y.o., female Today's Date: 11/20/2022  END OF SESSION:  PT End of Session - 11/20/22 1103     Visit Number 6    Number of Visits 10    Date for PT Re-Evaluation 11/29/22    PT Start Time 1101    PT Stop Time 1155    PT Time Calculation (min) 54 min    Activity Tolerance Patient tolerated treatment well    Behavior During Therapy WFL for tasks assessed/performed                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   Dysrhythmia    Afib   Heart murmur    Hx of adenomatous colonic polyps    Hyperlipidemia    Presence of permanent cardiac pacemaker    Past Surgical History:  Procedure Laterality Date   ATRIAL FLUTTER ABLATION     BIV PACEMAKER INSERTION CRT-P N/A 12/04/2021   Procedure: BIV PACEMAKER INSERTION CRT-P;  Surgeon: Lanier Prude, MD;  Location: [facility];  Service: Cardiovascular;  Laterality: N/A;   BREAST BIOPSY Left 07/30/2022   [place] LT BREAST BX W LOC DEV 1ST LESION IMG BX SPEC US GUIDE 07/30/2022 [facility]   BREAST BIOPSY Left 09/10/2022   [place] LT RADIOACTIVE SEED LOC 09/10/2022 [facility]   BREAST BIOPSY Left 09/10/2022   [place] LT RADIOACTIVE SEED LOC 09/10/2022 [facility]   BREAST EXCISIONAL BIOPSY Left 2010   benign   BREAST LUMPECTOMY WITH RADIOACTIVE SEED AND SENTINEL LYMPH NODE BIOPSY Left 09/11/2022   Procedure: LEFT BREAST LUMPECTOMY WITH RADIOACTIVE SEED;  Surgeon: Harriette Bouillon, MD;  Location: [facility] OR;  Service: General;  Laterality: Left;   CARDIOVERSION  05/2020   in [facility]   CARDIOVERSION N/A 06/21/2021   Procedure: CARDIOVERSION;  Surgeon: Sande Rives, MD;  Location: [facility] ENDOSCOPY;  Service: Cardiovascular;  Laterality: N/A;   CARDIOVERSION N/A 03/26/2022    Procedure: CARDIOVERSION;  Surgeon: Lewayne Bunting, MD;  Location: [facility] ENDOSCOPY;  Service: Cardiovascular;  Laterality: N/A;   CHOLECYSTECTOMY     CHOLECYSTECTOMY, LAPAROSCOPIC  '06   [name]   KNEE ARTHROSCOPY     Left '00 [place]/ Right '08   lumpectomy- remote     benign   RADIOACTIVE SEED GUIDED AXILLARY SENTINEL LYMPH NODE Left 09/11/2022   Procedure: RADIOACTIVE SEED GUIDED LEFT SENTINEL LYMPH NODE BIOPSY;  Surgeon: Harriette Bouillon, MD;  Location: [facility] OR;  Service: General;  Laterality: Left;   TOOTH EXTRACTION     TOTAL KNEE ARTHROPLASTY  02/05/2011   left   Patient Active Problem List   Diagnosis Date Noted   Genetic testing 09/18/2022   Pacemaker [employer] device BiV 09/06/2022   Malignant neoplasm of upper-outer quadrant of left breast in female, estrogen receptor positive (HCC) 08/06/2022   Pure hypercholesterolemia 05/15/2022   Skin cancer 03/07/2022   Closed nondisplaced fracture of distal phalanx of right thumb 02/15/2022   Atrial fibrillation (HCC) 12/04/2021   Cancer (HCC) 05/25/2021   Left bundle branch block 04/12/2021   Dilated cardiomyopathy (HCC) 04/12/2021   Vasculitis (HCC) 02/22/2021   Anxiety state 02/12/2021   Body fluid retention 02/12/2021   Cervical intraepithelial neoplasia 02/12/2021   Insomnia 02/12/2021  Pruritus of vulva 02/12/2021   Leg wound, right 10/03/2020   Bilateral primary osteoarthritis of knee 03/30/2019   Paroxysmal atrial fibrillation (HCC) 04/07/2015   Chronic anticoagulation 04/07/2015   Paroxysmal atrial flutter (HCC) 03/16/2015   Essential hypertension, benign 02/24/2014   Sciatica 12/08/2013   Hyperlipidemia 09/25/2013   Osteopenia 11/19/2012   Low back pain 01/26/2010   Carcinoma in situ of breast 09/04/2009   DEPRESSION, PROLONGED 09/04/2009   OA (osteoarthritis) of knee 09/04/2009   PERIPHERAL EDEMA 09/04/2009   COLONIC POLYPS, ADENOMATOUS, HX OF 09/04/2009   Malignant tumor of breast (HCC) 06/11/2009    REFERRING  PROVIDER: Dr. Harriette Bouillon  REFERRING DIAG: Left breast cancer  THERAPY DIAG:  Unsteadiness on feet  Chronic pain of right knee  Muscle weakness (generalized)  Difficulty in walking, not elsewhere classified  Aftercare following surgery for neoplasm  Abnormal posture  Malignant neoplasm of lower-outer quadrant of left breast of female, estrogen receptor positive (HCC)  Rationale for Evaluation and Treatment: Rehabilitation  ONSET DATE: 09/11/2022  SUBJECTIVE:                                                                                                                                                                                           SUBJECTIVE STATEMENT: Nothing new   PERTINENT HISTORY:  Patient was diagnosed on 07/30/2022 with left grade 2 invasive ductal carcinoma breast cancer. She underwent a left lumpectomy and sentinel node biopsy on 09/11/2022 with 2 of her 3 axillary nodes positive. It is ER/PR positive and HER2 negative with a Ki67 of 20%. She has a pacemaker and a previous history of left breast cancer in 2010. She underwent a lumpectomy and sentinel node biopsy (2 negative nodes) followed by tamoxifen. She declined radiation. She has had 4 cardioversions per her report. Also hx of left knee replacement.   PATIENT GOALS:  Reassess how my recovery is going related to arm function, pain, and swelling.  PAIN:  Are you having pain?  Yes - it averages 6/10, pt reports tylenol helps, R knee  medial side   PRECAUTIONS: Recent Surgery, left UE Lymphedema risk, ICD/Pacemaker  ACTIVITY LEVEL / LEISURE: She is doing some exercises for stretching her shoulder   OBJECTIVE:  OBSERVATIONS: Left breast and axillary incisions both appear to be well healed. There is tightness present and scar tissue present under her incision sites. No edema present.  POSTURE:  Forward head and rounded shoulders  LYMPHEDEMA ASSESSMENT:   UPPER EXTREMITY AROM/PROM:   A/PROM RIGHT    eval    Shoulder extension 42  Shoulder flexion 130  Shoulder abduction 130  Shoulder internal rotation  55  Shoulder external rotation 66                          (Blank rows = not tested)   A/PROM LEFT   eval LEFT 11/01/2022 11/15/22  Shoulder extension 48 43   Shoulder flexion 116 102 120  Shoulder abduction 118 106 130  Shoulder internal rotation 37 37   Shoulder external rotation 78 69                           (Blank rows = not tested)   CERVICAL AROM: All within functional limits   UPPER EXTREMITY STRENGTH: Functional  LOWER EXTREMITY STRENGTH: Right hip flexor 4-/5 Right knee extension 4+/5 Right knee flexion 4-/5  Sit to stand 10x without UEs = 40.5 seconds  TUG test  = 17 seconds   LYMPHEDEMA ASSESSMENTS:    LANDMARK RIGHT   eval RIGHT 11/01/2022  10 cm proximal to olecranon process 28 27  Olecranon process 23.7 24.4  10 cm proximal to ulnar styloid process 21.2 21.3  Just proximal to ulnar styloid process 15.1 14.8  Across hand at thumb web space 18 18  At base of 2nd digit 6.1 6  (Blank rows = not tested)   LANDMARK LEFT   eval LEFT 11/01/2022  10 cm proximal to olecranon process 29.4 29.5  Olecranon process 25.7 26.6  10 cm proximal to ulnar styloid process 22.8 21.8  Just proximal to ulnar styloid process 15.7 15.5  Across hand at thumb web space 17.5 17.8  At base of 2nd digit 6 5.9  (Blank rows = not tested)   Surgery type/Date: Left lumpectomy and sentinel node biopsy 09/11/2022 Number of lymph nodes removed: 3 Current/past treatment (chemo, radiation, hormone therapy): none Other symptoms:  Heaviness/tightness No Pain No Pitting edema No Infections No Decreased scar mobility Yes Stemmer sign No  TREATMENT PERFORMED: 11/20/22:  Nustep level 3, seat at 6, arms at 7, x 10 min, - 892 In sitting: Seated knee extension with 3 lb ankle wieghts x 10 reps x 2 sets Seated marching with 3 lb ankle weights x 10 x 2 sets on wobble disc  Ball  squeezes x 10 with 3 sec holds Sit to stand with bicep curl 2# from airex 2x5 (reports she could do maybe 10 more)  In supine: Shoulder flexion x 10 2#  Bridging x 10 S/L: Clamshells x 10 reps bilaterally Standing: Completed berg balance test: got a 47/56 : narrow BOS and single leg stance the only difficult items.  Airex beam heel toe walking x 3 lengths and then tandem stance bil 3xmax    11/15/22:  Remeasured arms which have returned to prior measurements Nustep level 2, seat at 6, arms at 7, x 10 min, - 541 steps, 97%, 60bpm - has pacemaker In sitting: Seated knee extension with 3 lb ankle wieghts x 10 reps x 2 sets Seated marching with 3 lb ankle weights x 10 x 2 sets Ball squeezes x 10 with 3 sec holds x 2 sets In supine: Bridging with v/c to keep core engaged x 10 reps with vcs to stop if back starts to hurt  Alternating punch 2# 2x10  S/L: Clamshells x 10 reps bilaterally Standing:  3 way hip at treadmill with yellow band x 10 reps in each direction with v/c and t/c to keep knee straight throughout, pt had increased difficulty keeping knee straight especially  with extension and abduction Yellow row x 15 Tandem walking with 1 x HHA on // bars x 4 on airex with pt able to achieve nearly full tandem stance  11/08/22:  Nustep level 2, seat at 6, arms at 7, x 10 min, - 541 steps, 97%, 60bpm - has pacemaker In sitting: Seated knee extension with 3 lb ankle wieghts x 10 reps x 2 sets Seated marching with 3 lb ankle weights x 10 x 2 sets Ball squeezes x 10 with 3 sec holds x 2 sets In supine: Bridigng with v/c to keep core engaged x 10 reps S/L: Clamshells x 10 reps bilaterally Standing:  3 way hip at treadmill with yellow band x 10 reps in each direction with v/c and t/c to keep knee straight throughout, pt had increased difficulty keeping knee straight especially with extension and abduction SLS on air ex in // bars with min assist x 10 reps Tandem walking with 1 x HHA on //  bars x 4 on airex with pt able to achieve nearly full tandem stance  11/06/22:  Nustep level 1, seat at 6, arms at 6, 7 min - 541 steps, 97%, 60bpm - has pacemaker Seated knee extension with 2 lb ankle wieghts x 10 reps Seated marching with 2 lb ankle weights x 10  Ball squeezes x 10 with 3 sec holds Standing 3 way hip in // bars with no resistance x 10 reps in each direction with v/c and t/c to keep knee straight throughout, pt had increased difficulty when weight bearing on R side due to knee pain Step ups on air ex x 10 reps Tandem walking with 1 x HHA on // bars x 4 with pt able to achieve nearly full tandem stance Backwards walking x 2 with 1 HHA and no losses of balance Stepping over 4 orange hurdles x 4 with pt improving each rep and by end was not touching the hurdle with her foot  Heel raises x 10 with HHA Toe raises x 10 with HHA  PATIENT EDUCATION:  Education details: HEP and scar massage Person educated: Patient and Child(ren) Education method: Explanation, Demonstration, Verbal cues, and Handouts Education comprehension: verbalized understanding and returned demonstration  HOME EXERCISE PROGRAM: Access Code: [email or web address] URL: [email or web address] Date: 11/08/2022 Prepared by: Leonette Most  Exercises - Supine Bridge  - 1 x daily - 7 x weekly - 1-2 sets - 10 reps - 3 sec hold - Supine Active Straight Leg Raise  - 1 x daily - 7 x weekly - 1-2 sets - 10 reps - Clamshell  - 1 x daily - 7 x weekly - 1-2 sets - 10 reps - Standing Hip Abduction with Counter Support  - 1 x daily - 7 x weekly - 1-2 sets - 10 reps - Standing Hip Extension with Counter Support  - 1 x daily - 7 x weekly - 1-2 sets - 10 reps - Standing Hip Flexion with Counter Support  - 1 x daily - 7 x weekly - 1-2 sets - 10 reps - Mini Squat with Counter Support  - 1 x daily - 7 x weekly - 1-2 sets - 10 reps   ASSESSMENT:  CLINICAL IMPRESSION: Pt mentioned that she feels like she could go  to the gym soon but would like to feel better about her balance.  Performed berg and scored 47/56 which demonstrates a moderate risk of fall.  She had trouble only with narrow bos and single leg work.  Pt will benefit from skilled therapeutic intervention to improve on the following deficits: Decreased knowledge of precautions, impaired UE functional use, pain, decreased ROM, postural dysfunction, balance and strength.  PT treatment/interventions: ADL/Self care home management, Therapeutic exercises, Therapeutic activity, Neuromuscular re-education, Balance training, Gait training, Patient/Family education, Self Care, Stair training, Manual lymph drainage, scar mobilization, Manual therapy, and Re-evaluation   GOALS: Goals reviewed with patient? Yes  LONG TERM GOALS:  (STG=LTG)  GOALS Name Target Date  Goal status  1 Pt will demonstrate she has regained full shoulder ROM and function post operatively compared to baselines.  Baseline: 11/29/2022 MET  2 Patient will improve her TUG time to be </= 14 seconds for improved overall mobility and safety. 11/29/2022 INITIAL  3 Patient will demonstrate she can perform sit to stand without use of UE from standard chair height 10 times in </= 34 seconds. 11/29/2022 INITIAL  4 Patient will report she has returned to some time of regular exercise such as water exercise or a stationary bike (limited by her knee) 11/29/2022 INITIAL     PLAN:  PT FREQUENCY/DURATION: 2x/week for 4 weeks  PLAN FOR NEXT SESSION: general TE; balance - narrow and single leg, obstacle course, etc. DC when ready for gym.    [facility]  [address]  [place] [address]  [phone]  After Breast Cancer Class It is recommended you attend the ABC class to be educated on lymphedema risk reduction. This class is free of charge and lasts for 1 hour. It is a 1-time class. You will need to download the TEAMS app either on your phone or computer.  We will send you a link the night before or the morning of the class. You should be able to click on that link to join the class. This is not a confidential class. You don't have to turn your camera on, but other participants may be able to see your email address. You're scheduled for June 17th at 12:00.  Scar massage You can begin gentle scar massage to you incision sites. Gently place one hand on the incision and move the skin (without sliding on the skin) in various directions. Do this for a few minutes and then you can gently massage either coconut oil or vitamin E cream into the scars.  Compression garment You should continue wearing your compression bra until you feel like you no longer have swelling.  Home exercise Program Continue doing the exercises you were given until you feel like you can do them without feeling any tightness at the end. Start doing the first and last ones on the paper laying flat on your back.  Walking Program Studies show that 30 minutes of walking per day (fast enough to elevate your heart rate) can significantly reduce the risk of a cancer recurrence. If you can't walk due to other medical reasons, we encourage you to find another activity you could do (like a stationary bike or water exercise).  Posture After breast cancer surgery, people frequently sit with rounded shoulders posture because it puts their incisions on slack and feels better. If you sit like this and scar tissue forms in that position, you can become very tight and have pain sitting or standing with good posture. Try to be aware of your posture and sit and stand up tall to heal properly.  Bethann Punches, [place] 11/20/22 11:56 AM

## 2022-11-21 ENCOUNTER — Encounter: Payer: Self-pay | Admitting: Family Medicine

## 2022-11-22 ENCOUNTER — Ambulatory Visit: Payer: Medicare Other | Admitting: Physical Therapy

## 2022-11-23 ENCOUNTER — Encounter: Payer: Self-pay | Admitting: Hematology

## 2022-11-23 ENCOUNTER — Encounter: Payer: Self-pay | Admitting: Physical Therapy

## 2022-11-23 ENCOUNTER — Telehealth: Payer: Self-pay | Admitting: Hematology

## 2022-11-26 ENCOUNTER — Telehealth: Payer: Self-pay | Admitting: Hematology

## 2022-11-26 ENCOUNTER — Other Ambulatory Visit: Payer: Self-pay

## 2022-11-26 DIAGNOSIS — Z170 Estrogen receptor positive status [ER+]: Secondary | ICD-10-CM

## 2022-11-27 ENCOUNTER — Ambulatory Visit: Payer: Medicare Other | Admitting: Family Medicine

## 2022-11-27 ENCOUNTER — Ambulatory Visit: Payer: Medicare Other | Admitting: Rehabilitation

## 2022-11-27 ENCOUNTER — Inpatient Hospital Stay: Payer: Medicare Other | Attending: Hematology

## 2022-11-27 ENCOUNTER — Inpatient Hospital Stay: Payer: Medicare Other

## 2022-11-27 ENCOUNTER — Other Ambulatory Visit: Payer: Self-pay

## 2022-11-27 ENCOUNTER — Inpatient Hospital Stay (HOSPITAL_BASED_OUTPATIENT_CLINIC_OR_DEPARTMENT_OTHER): Payer: Medicare Other | Admitting: Hematology

## 2022-11-27 ENCOUNTER — Encounter: Payer: Self-pay | Admitting: Hematology

## 2022-11-27 VITALS — BP 124/61 | HR 59 | Temp 98.6°F | Resp 15 | Ht 62.0 in | Wt 182.9 lb

## 2022-11-27 DIAGNOSIS — R300 Dysuria: Secondary | ICD-10-CM

## 2022-11-27 DIAGNOSIS — R509 Fever, unspecified: Secondary | ICD-10-CM

## 2022-11-27 DIAGNOSIS — C50412 Malignant neoplasm of upper-outer quadrant of left female breast: Secondary | ICD-10-CM | POA: Diagnosis present

## 2022-11-27 DIAGNOSIS — Z170 Estrogen receptor positive status [ER+]: Secondary | ICD-10-CM | POA: Insufficient documentation

## 2022-11-27 DIAGNOSIS — N61 Mastitis without abscess: Secondary | ICD-10-CM | POA: Diagnosis not present

## 2022-11-27 LAB — CBC WITH DIFFERENTIAL (CANCER CENTER ONLY)
Abs Immature Granulocytes: 0.09 10*3/uL — ABNORMAL HIGH (ref 0.00–0.07)
Basophils Absolute: 0.1 10*3/uL (ref 0.0–0.1)
Basophils Relative: 1 %
Eosinophils Absolute: 0.1 10*3/uL (ref 0.0–0.5)
Eosinophils Relative: 1 %
HCT: 37.5 % (ref 36.0–46.0)
Hemoglobin: 12.5 g/dL (ref 12.0–15.0)
Immature Granulocytes: 1 %
Lymphocytes Relative: 29 %
Lymphs Abs: 3.2 10*3/uL (ref 0.7–4.0)
MCH: 33.8 pg (ref 26.0–34.0)
MCHC: 33.3 g/dL (ref 30.0–36.0)
MCV: 101.4 fL — ABNORMAL HIGH (ref 80.0–100.0)
Monocytes Absolute: 1.4 10*3/uL — ABNORMAL HIGH (ref 0.1–1.0)
Monocytes Relative: 13 %
Neutro Abs: 6.1 10*3/uL (ref 1.7–7.7)
Neutrophils Relative %: 55 %
Platelet Count: 230 10*3/uL (ref 150–400)
RBC: 3.7 MIL/uL — ABNORMAL LOW (ref 3.87–5.11)
RDW: 13.1 % (ref 11.5–15.5)
WBC Count: 11 10*3/uL — ABNORMAL HIGH (ref 4.0–10.5)
nRBC: 0 % (ref 0.0–0.2)

## 2022-11-27 LAB — URINALYSIS, COMPLETE (UACMP) WITH MICROSCOPIC
Bilirubin Urine: NEGATIVE
Glucose, UA: NEGATIVE mg/dL
Ketones, ur: 5 mg/dL — AB
Nitrite: NEGATIVE
Protein, ur: 30 mg/dL — AB
Specific Gravity, Urine: 1.021 (ref 1.005–1.030)
pH: 6 (ref 5.0–8.0)

## 2022-11-27 LAB — CMP (CANCER CENTER ONLY)
ALT: 21 U/L (ref 0–44)
AST: 19 U/L (ref 15–41)
Albumin: 3.2 g/dL — ABNORMAL LOW (ref 3.5–5.0)
Alkaline Phosphatase: 61 U/L (ref 38–126)
Anion gap: 6 (ref 5–15)
BUN: 11 mg/dL (ref 8–23)
CO2: 30 mmol/L (ref 22–32)
Calcium: 9.6 mg/dL (ref 8.9–10.3)
Chloride: 101 mmol/L (ref 98–111)
Creatinine: 0.78 mg/dL (ref 0.44–1.00)
GFR, Estimated: >60 mL/min (ref >60)
Glucose, Bld: 96 mg/dL (ref 70–99)
Potassium: 3.2 mmol/L — ABNORMAL LOW (ref 3.5–5.1)
Sodium: 137 mmol/L (ref 135–145)
Total Bilirubin: 1.4 mg/dL — ABNORMAL HIGH (ref 0.3–1.2)
Total Protein: 6.1 g/dL — ABNORMAL LOW (ref 6.5–8.1)

## 2022-11-27 NOTE — Assessment & Plan Note (Signed)
[id number], stage IA, ER+/PR+/HER2-, G2 -She was diagnosed in February 2024 -She underwent left breast lumpectomy and sentinel lymph node biopsy, I discussed her surgical findings with patient in detail.  Surgical margins were negative -Adjuvant radiation was recommended and offered, however due to her pacemaker on the same side of her breast cancer, the ultimate decision was not to proceed with adjuvant radiation due to her pacemaker and her advanced age. -Due to her advanced age, she is not a candidate for chemotherapy, so Oncotype was not obtained. -She started adjuvant tamoxifen in early June 2020 -She developed right breast cellulitis last week, was seen by her surgeon's PA last Friday and yesterday, had aspiration twice from the seroma, culture was sent, she was initially put on doxycycline last Friday, switched to

## 2022-11-27 NOTE — Progress Notes (Signed)
[facility]   [phone] [phone]   Clinic Follow up Note   Patient Care Team: Copland, Gwenlyn Found, MD as PCP - General (Family Medicine) Lanier Prude, MD as PCP - Electrophysiology (Cardiology) Valeria Batman, MD (Inactive) (Orthopedic Surgery) Annamaria Helling, MD (Obstetrics and Gynecology) Harriette Bouillon, MD (General Surgery) Joneen Caraway, PA-C as Physician Assistant (Dermatology) Harriette Bouillon, MD as Consulting Physician (General Surgery) Malachy Mood, MD as Consulting Physician (Hematology) Antony Blackbird, MD as Consulting Physician (Radiation Oncology) Donnelly Angelica, RN as Oncology Nurse Navigator Pershing Proud, RN as Oncology Nurse Navigator  Date of Service:  11/27/2022  CHIEF COMPLAINT: f/u of left breast cancer   CURRENT THERAPY:  Tamoxifen starting 11/2022   ASSESSMENT: Sheila Schmidt is a 87 y.o. female with   Malignant neoplasm of upper-outer quadrant of left breast in female, estrogen receptor positive (HCC) pT1bN1aM0, stage IA, ER+/PR+/HER2-, G2 -She was diagnosed in February 2024 -She underwent left breast lumpectomy and sentinel lymph node biopsy, I discussed her surgical findings with patient in detail.  Surgical margins were negative -Adjuvant radiation was recommended and offered, however due to her pacemaker on the same side of her breast cancer, the ultimate decision was not to proceed with adjuvant radiation due to her pacemaker and her advanced age. -Due to her advanced age, she is not a candidate for chemotherapy, so Oncotype was not obtained. -She was planned to start tamoxifen this month, due to her ongoing left breast cellulitis, will hold off for months.  She was started after the infection resolves.  She has weaned off Prozac, and was started Effexor this week.  Left breast seroma and cellulitis -She developed left breast cellulitis last week, was seen by her surgeon's PA last Friday and yesterday,  had aspiration twice from the seroma, culture was sent, she was initially put on doxycycline last Friday, switched to Augmentin yesterday -She will continue antibiotics, and follow-up with her surgical PA in 2 days -I will also obtain a blood culture today, will follow-up on results  Dysuria -Check a UA and urine culture today     PLAN: -lab reviewed -blood culture order -UA order today to rule out UTI -She was started Effexor this week, will start tamoxifen when her breast cellulitis resolves, likely in a months. -Survivorship in 2 months as scheduled   SUMMARY OF ONCOLOGIC HISTORY: Oncology History Overview Note   Cancer Staging  Malignant neoplasm of upper-outer quadrant of left breast in female, estrogen receptor positive (HCC) Staging form: Breast, AJCC 8th Edition - Clinical stage from 07/30/2022: Stage IB (cT1b, cN1, cM0, G2, ER+, PR+, HER2-) - Signed by Malachy Mood, MD on 08/07/2022 Stage prefix: Initial diagnosis Histologic grading system: 3 grade system - Pathologic stage from 09/11/2022: Stage IA (pT1b, pN1a, cM0, G2, ER+, PR+, HER2-) - Signed by Malachy Mood, MD on 09/24/2022 Stage prefix: Initial diagnosis Histologic grading system: 3 grade system Residual tumor (R): R0 - None     Malignant neoplasm of upper-outer quadrant of left breast in female, estrogen receptor positive (HCC)  07/30/2022 Cancer Staging   Staging form: Breast, AJCC 8th Edition - Clinical stage from 07/30/2022: Stage IB (cT1b, cN1, cM0, G2, ER+, PR+, HER2-) - Signed by Malachy Mood, MD on 08/07/2022 Stage prefix: Initial diagnosis Histologic grading system: 3 grade system   08/06/2022 Initial Diagnosis   Malignant neoplasm of upper-outer quadrant of left breast in female, estrogen receptor positive (HCC)   08/23/2022 Genetic Testing   Negative [facility]  Common Hereditary Cancers +RNA Panel.  VUS detected in PMS2 at c.791A>C (p.His264Pro) and POLE at c.444G>C (p.Leu148Phe).  Report date is 08/23/2022.    The  [facility] Common Hereditary Cancers + RNA Panel includes sequencing, deletion/duplication, and RNA analysis of the following 48 genes: APC, ATM, AXIN2, BAP1, BARD1, BMPR1A, BRCA1, BRCA2, BRIP1, CDH1, CDK4*, CDKN2A*, CHEK2, CTNNA1, DICER1, EPCAM* (del/dup only), FH, GREM1* (promoter dup analysis only), HOXB13*, KIT*, MBD4*, MEN1, MLH1, MSH2, MSH3, MSH6, MUTYH, NF1, NTHL1, PALB2, PDGFRA*, PMS2, POLD1, POLE, PTEN, RAD51C, RAD51D, SDHA (sequencing only), SDHB, SDHC, SDHD, SMAD4, SMARCA4, STK11, TP53, TSC1, TSC2, VHL.  *Genes without RNA analysis.    09/11/2022 Cancer Staging   Staging form: Breast, AJCC 8th Edition - Pathologic stage from 09/11/2022: Stage IA (pT1b, pN1a, cM0, G2, ER+, PR+, HER2-) - Signed by Malachy Mood, MD on 09/24/2022 Stage prefix: Initial diagnosis Histologic grading system: 3 grade system Residual tumor (R): R0 - None   09/11/2022 Pathology Results     FINAL MICROSCOPIC DIAGNOSIS:  A. BREAST, LEFT, LUMPECTOMY: Invasive ductal carcinoma, 0.8 cm, grade II/III Ductal carcinoma in situ: Not identified Margins, invasive: Negative     Closest, invasive: Posterior at 6 mm Margins, DCIS: N/A     Closest, DCIS: N/A Lymphovascular invasion: Suspicious for small arteriolar invasion Prognostic markers:  ER positive, PR positive, Her2 negative, Ki-67 20% % Other: N/A See oncology table  B. LYMPH NODE, LEFT AXILLA, SENTINEL, EXCISION: -  1 lymph node positive for malignancy, 18 mm in greatest dimension, with extracapsular extension (1/1).  C. LYMPH NODE, LEFT AXILLA, SENTINEL, EXCISION: -  1 lymph node positive for malignancy, 4 mm in greatest dimension (1/1).  D. LYMPH NODE, LEFT AXILLA, SENTINEL, EXCISION: -  1 lymph node negative for malignancy (0/1).  E. BREAST, LEFT ADDITIONAL MEDIAL MARGIN, EXCISION: -  Benign breast tissue, negative for malignancy. -  New additional margin thickness 7 mm.  F. BREAST, LEFT ADDITIONAL SUPERIOR MARGIN, EXCISION: -  Benign breast tissue,  negative for malignancy. -  New additional margin thickness 13 mm.  G. BREAST, LEFT ADDITIONAL INFERIOR MARGIN, EXCISION: -  Benign breast tissue, negative for malignancy. -  New additional margin thickness 6 mm  H. BREAST, LEFT ADDITIONAL LATERAL MARGIN, EXCISION: -  Benign breast tissue, negative for malignancy. -  New additional margin thickness 5 mm.       INTERVAL HISTORY:  Sheila Schmidt is here for a follow up of left breast cancer. She was last seen by me on 11/09/2022. She presents to the clinic accompanied by family member. Pt developed right breast cellulitis.Pt state that inflammation started last Thursday and aspiration twice on the left breast,pt state that she is able to move her left arm. She is in some pain and is taking Tylenol for pain.Pt is on Augmentin   All other systems were reviewed with the patient and are negative.  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   Dysrhythmia    Afib   Heart murmur    Hx of adenomatous colonic polyps    Hyperlipidemia    Presence of permanent cardiac pacemaker     SURGICAL HISTORY: Past Surgical History:  Procedure Laterality Date   ATRIAL FLUTTER ABLATION     BIV PACEMAKER INSERTION CRT-P N/A 12/04/2021   Procedure: BIV PACEMAKER INSERTION CRT-P;  Surgeon: Lanier Prude, MD;  Location: [facility] INVASIVE CV LAB;  Service: Cardiovascular;  Laterality: N/A;   BREAST BIOPSY Left 07/30/2022   [place] LT BREAST BX W LOC DEV 1ST LESION IMG BX SPEC US GUIDE 07/30/2022 [facility] MAMMOGRAPHY   BREAST BIOPSY Left 09/10/2022   [place] LT RADIOACTIVE SEED LOC 09/10/2022 [facility] MAMMOGRAPHY   BREAST BIOPSY Left 09/10/2022   [place] LT RADIOACTIVE SEED LOC 09/10/2022 [facility] MAMMOGRAPHY   BREAST EXCISIONAL BIOPSY Left 2010   benign   BREAST LUMPECTOMY WITH RADIOACTIVE SEED AND SENTINEL LYMPH NODE BIOPSY Left  09/11/2022   Procedure: LEFT BREAST LUMPECTOMY WITH RADIOACTIVE SEED;  Surgeon: Harriette Bouillon, MD;  Location: [facility] OR;  Service: General;  Laterality: Left;   CARDIOVERSION  05/2020   in [facility]   CARDIOVERSION N/A 06/21/2021   Procedure: CARDIOVERSION;  Surgeon: Sande Rives, MD;  Location: [facility] ENDOSCOPY;  Service: Cardiovascular;  Laterality: N/A;   CARDIOVERSION N/A 03/26/2022   Procedure: CARDIOVERSION;  Surgeon: Lewayne Bunting, MD;  Location: [facility] ENDOSCOPY;  Service: Cardiovascular;  Laterality: N/A;   CHOLECYSTECTOMY     CHOLECYSTECTOMY, LAPAROSCOPIC  '06   [name]   KNEE ARTHROSCOPY     Left '00 [place]/ Right '08   lumpectomy- remote     benign   RADIOACTIVE SEED GUIDED AXILLARY SENTINEL LYMPH NODE Left 09/11/2022   Procedure: RADIOACTIVE SEED GUIDED LEFT SENTINEL LYMPH NODE BIOPSY;  Surgeon: Harriette Bouillon, MD;  Location: [facility] OR;  Service: General;  Laterality: Left;   TOOTH EXTRACTION     TOTAL KNEE ARTHROPLASTY  02/05/2011   left    I have reviewed the social history and family history with the patient and they are unchanged from previous note.  ALLERGIES:  has No Known Allergies.  MEDICATIONS:  Current Outpatient Medications  Medication Sig Dispense Refill   acetaminophen (TYLENOL) 500 MG tablet Take 500-1,000 mg by mouth every 8 (eight) hours as needed for moderate pain.     acetaminophen-codeine (TYLENOL #3) 300-30 MG tablet Take 1 tablet by mouth every 6 (six) hours as needed for moderate pain. 30 tablet 0   amiodarone (PACERONE) 200 MG tablet Take 1 tablet (200 mg total) by mouth daily. 90 tablet 2   atorvastatin (LIPITOR) 20 MG tablet TAKE 1 TABLET BY MOUTH DAILY (Patient taking differently: Take 20 mg by mouth at bedtime.) 90 tablet 3   Calcium Carbonate-Vit D-Min (CALTRATE 600+D PLUS MINERALS) 600-800 MG-UNIT CHEW Chew 1 each by mouth daily.     Cholecalciferol 50 MCG (2000 UT) TABS Take 2,000 Units by mouth daily.     ENTRESTO 24-26 MG TAKE 1 TABLET BY MOUTH TWICE   DAILY 180 tablet 3   famotidine (PEPCID) 40 MG tablet Take 40 mg by mouth daily as needed for heartburn or indigestion.     FLUoxetine (PROZAC) 10 MG capsule Take 1 capsule (10 mg total) by mouth daily. Take for 7 days then every other day for a week then stop 10 capsule 0   furosemide (LASIX) 20 MG tablet Start with a 1/2 tablet daily as needed for swelling, can go up to a whole tablet/ 20 mg if needed 30 tablet 3   melatonin 5 MG TABS Take 1.25 mg by mouth at bedtime as needed (sleep).     tamoxifen (NOLVADEX) 20 MG tablet Take 1 tablet (20 mg total) by mouth daily. 30 tablet 5   venlafaxine XR (EFFEXOR-XR) 37.5 MG 24 hr capsule Take 1 capsule (37.5 mg total) by mouth daily with breakfast. 30  capsule 2   vitamin B-12 (CYANOCOBALAMIN) 100 MCG tablet Take 100 mcg by mouth daily.     XARELTO 20 MG TABS tablet TAKE 1 TABLET BY MOUTH DAILY  WITH SUPPER 90 tablet 3   No current facility-administered medications for this visit.    PHYSICAL EXAMINATION: ECOG PERFORMANCE STATUS: 1 - Symptomatic but completely ambulatory  Vitals:   11/27/22 1011  BP: 124/61  Pulse: (!) 59  Resp: 15  Temp: 98.6 F (37 C)  SpO2: 95%   Wt Readings from Last 3 Encounters:  11/27/22 182 lb 14.4 oz (83 kg)  11/09/22 176 lb 12.8 oz (80.2 kg)  10/31/22 176 lb 3.2 oz (79.9 kg)     GENERAL:alert, no distress and comfortable SKIN: skin color normal, no rashes or significant lesions EYES: normal, Conjunctiva are pink and non-injected, sclera clear  NEURO: alert & oriented x 3 with fluent speech BREAST: LT breast lumpectomy, (+) diffuse skin erythema around the surgical incision, she is draining (see picture below)     LABORATORY DATA:  I have reviewed the data as listed    Latest Ref Rng & Units 11/27/2022    9:44 AM 09/03/2022   10:55 AM 08/08/2022   12:16 PM  CBC  WBC 4.0 - 10.5 K/uL 11.0  7.9  10.3   Hemoglobin 12.0 - 15.0 g/dL 32.4  40.1  02.7   Hematocrit 36.0 - 46.0 % 37.5  40.8  42.3   Platelets  150 - 400 K/uL 230  181  233         Latest Ref Rng & Units 11/27/2022    9:44 AM 09/03/2022   10:55 AM 08/08/2022   12:16 PM  CMP  Glucose 70 - 99 mg/dL 96  92  76   BUN 8 - 23 mg/dL 11  14  16    Creatinine 0.44 - 1.00 mg/dL 2.53  6.64  4.03   Sodium 135 - 145 mmol/L 137  135  140   Potassium 3.5 - 5.1 mmol/L 3.2  4.3  4.0   Chloride 98 - 111 mmol/L 101  102  106   CO2 22 - 32 mmol/L 30  26  29    Calcium 8.9 - 10.3 mg/dL 9.6  9.2  9.2   Total Protein 6.5 - 8.1 g/dL 6.1   6.4   Total Bilirubin 0.3 - 1.2 mg/dL 1.4   1.1   Alkaline Phos 38 - 126 U/L 61   56   AST 15 - 41 U/L 19   18   ALT 0 - 44 U/L 21   17       RADIOGRAPHIC STUDIES: I have personally reviewed the radiological images as listed and agreed with the findings in the report. No results found.    Orders Placed This Encounter  Procedures   Urine Culture    Standing Status:   Future    Number of Occurrences:   1    Standing Expiration Date:   11/27/2023   Culture, Blood    Standing Status:   Future    Number of Occurrences:   1    Standing Expiration Date:   11/27/2023   Urinalysis, Complete w Microscopic    Standing Status:   Future    Number of Occurrences:   1    Standing Expiration Date:   11/27/2023   All questions were answered. The patient knows to call the clinic with any problems, questions or concerns. No barriers to learning was detected. The total  time spent in the appointment was 25 minutes.     Malachy Mood, MD 11/27/2022   Carolin Coy, CMA, am acting as scribe for Malachy Mood, MD.   I have reviewed the above documentation for accuracy and completeness, and I agree with the above.

## 2022-11-28 LAB — URINE CULTURE: Culture: NO GROWTH

## 2022-11-29 ENCOUNTER — Encounter: Payer: Medicare Other | Admitting: Rehabilitation

## 2022-11-30 LAB — CULTURE, BLOOD (SINGLE)

## 2022-12-02 LAB — CULTURE, BLOOD (SINGLE): Culture: NO GROWTH

## 2022-12-03 ENCOUNTER — Encounter: Payer: Self-pay | Admitting: Family Medicine

## 2022-12-04 ENCOUNTER — Ambulatory Visit (INDEPENDENT_AMBULATORY_CARE_PROVIDER_SITE_OTHER): Payer: Medicare Other

## 2022-12-04 DIAGNOSIS — I428 Other cardiomyopathies: Secondary | ICD-10-CM

## 2022-12-05 LAB — CUP PACEART REMOTE DEVICE CHECK
Battery Remaining Longevity: 73 mo
Battery Remaining Percentage: 87 %
Battery Voltage: 2.99 V
Brady Statistic AP VP Percent: 86 %
Brady Statistic AP VS Percent: 1 %
Brady Statistic AS VP Percent: 2.8 %
Brady Statistic AS VS Percent: 11 %
Brady Statistic RA Percent Paced: 86 %
Date Time Interrogation Session: 20240625040010
Implantable Lead Connection Status: 753985
Implantable Lead Connection Status: 753985
Implantable Lead Connection Status: 753985
Implantable Lead Implant Date: 20230626
Implantable Lead Implant Date: 20230626
Implantable Lead Implant Date: 20230626
Implantable Lead Location: 753858
Implantable Lead Location: 753859
Implantable Lead Location: 753860
Implantable Pulse Generator Implant Date: 20230626
Lead Channel Impedance Value: 400 Ohm
Lead Channel Impedance Value: 460 Ohm
Lead Channel Impedance Value: 840 Ohm
Lead Channel Pacing Threshold Amplitude: 0.5 V
Lead Channel Pacing Threshold Amplitude: 0.75 V
Lead Channel Pacing Threshold Amplitude: 1.25 V
Lead Channel Pacing Threshold Pulse Width: 0.5 ms
Lead Channel Pacing Threshold Pulse Width: 0.5 ms
Lead Channel Pacing Threshold Pulse Width: 0.5 ms
Lead Channel Sensing Intrinsic Amplitude: 1.2 mV
Lead Channel Sensing Intrinsic Amplitude: 11.3 mV
Lead Channel Setting Pacing Amplitude: 2.25 V
Lead Channel Setting Pacing Amplitude: 2.5 V
Lead Channel Setting Pacing Amplitude: 2.5 V
Lead Channel Setting Pacing Pulse Width: 0.5 ms
Lead Channel Setting Pacing Pulse Width: 0.5 ms
Lead Channel Setting Sensing Sensitivity: 2 mV
Pulse Gen Model: 3562
Pulse Gen Serial Number: 8071621

## 2022-12-06 ENCOUNTER — Ambulatory Visit (HOSPITAL_BASED_OUTPATIENT_CLINIC_OR_DEPARTMENT_OTHER)
Admission: RE | Admit: 2022-12-06 | Discharge: 2022-12-06 | Disposition: A | Discharge status: Home / Self Care | Payer: Medicare Other | Source: Ambulatory Visit | Attending: Family Medicine | Admitting: Family Medicine

## 2022-12-06 ENCOUNTER — Ambulatory Visit: Payer: Medicare Other | Admitting: Family Medicine

## 2022-12-06 VITALS — BP 124/60 | HR 61 | Temp 97.8°F | Resp 18 | Ht 62.0 in | Wt 187.4 lb

## 2022-12-06 DIAGNOSIS — L03112 Cellulitis of left axilla: Secondary | ICD-10-CM | POA: Diagnosis not present

## 2022-12-06 DIAGNOSIS — R600 Localized edema: Secondary | ICD-10-CM

## 2022-12-06 DIAGNOSIS — E876 Hypokalemia: Secondary | ICD-10-CM | POA: Diagnosis not present

## 2022-12-06 LAB — BASIC METABOLIC PANEL
BUN: 12 mg/dL (ref 6–23)
CO2: 32 mEq/L (ref 19–32)
Calcium: 9.1 mg/dL (ref 8.4–10.5)
Chloride: 102 mEq/L (ref 96–112)
Creatinine, Ser: 0.95 mg/dL (ref 0.40–1.20)
GFR: 54.89 mL/min — ABNORMAL LOW (ref >60.00)
Glucose, Bld: 85 mg/dL (ref 70–99)
Potassium: 3.9 mEq/L (ref 3.5–5.1)
Sodium: 140 mEq/L (ref 135–145)

## 2022-12-06 LAB — CBC
HCT: 38.5 % (ref 36.0–46.0)
Hemoglobin: 12.4 g/dL (ref 12.0–15.0)
MCHC: 32.2 g/dL (ref 30.0–36.0)
MCV: 102 fl — ABNORMAL HIGH (ref 78.0–100.0)
Platelets: 272 10*3/uL (ref 150.0–400.0)
RBC: 3.77 Mil/uL — ABNORMAL LOW (ref 3.87–5.11)
RDW: 14.1 % (ref 11.5–15.5)
WBC: 9.6 10*3/uL (ref 4.0–10.5)

## 2022-12-06 LAB — BRAIN NATRIURETIC PEPTIDE: Pro B Natriuretic peptide (BNP): 343 pg/mL — ABNORMAL HIGH (ref 0.0–100.0)

## 2022-12-06 NOTE — Progress Notes (Addendum)
Healthcare at [facility] [address], [address] [place], [place] [address] [phone]  Date:  12/06/2022   Name:  Sheila Schmidt   DOB:  1935/09/24   MRN:  [id number]  PCP:  Pearline Cables, MD    Chief Complaint: swelling secondary to recent surgeries (L hand and both ankles are very swollen. /Concerns/ questions: pts daughter would also like to discuss her new Rx of Effexor and obtain refills at [facility]. (Oncology weaned her off of Fluoxetine d/t her starting Tamaxifen) )   History of Present Illness:  Sheila Schmidt is a 87 y.o. very pleasant female patient who presents with the following:  Patient seen today with concern of swelling Most recent visit with myself about 2 months ago History of nonischemic cardiomyopathy with pacemaker placement in June 2023 (also history of atrial fibrillation, hyperlipidemia, chronic knee pain from arthritis, osteopenia, breast cancer LEFT   She was found to have recurrent breast cancer this year/2024, had a lumpectomy in early April 2024 with a limited node dissection They have been thinking about radiation but this would require moving her pacemaker-Per notes, it looks like they plan to get a second opinion from [facility] to help go over all treatment options  She has struggled with leg swelling in the past-seen in April with swelling mostly in her right leg We got an ultrasound earlier this year for swelling, no evidence of DVT  She had midrange BNP in January, 325  Echo done 09/06/22: 1. Left ventricular ejection fraction, by estimation, is 55 to 60%. The  left ventricle has normal function. The left ventricle has no regional  wall motion abnormalities. Left ventricular diastolic parameters are  consistent with Grade II diastolic  dysfunction (pseudonormalization).   2. Right ventricular systolic function is normal. The right ventricular  size is normal. There is severely elevated pulmonary artery  systolic  pressure.   From most recent cardiology note, 3/24:  Dilated cardiomyopathy.  Last estimated ejection fraction was last summer.  I wish to have echocardiogram done before it that is before surgery, so I will schedule to have echocardiogram to be done tomorrow on Monday I do not think finding on the echocardiogram will prevent [place] from proceeding with surgery just give [place] some idea about how liberate we can be with fluids in her situation.  In the meantime we will continue present medications.  Previously had difficulty putting her guidelines directed medical therapy because of low blood pressure.  Therefore she is only on Entresto.  But this discussion will continue hopefully will be able to add some beta-blocker in the future. Paroxysmal atrial fibrillation: Maintained sinus rhythm, continue with amiodarone goal in the future will be to reduce to 100 mg daily, I prefer not to do it right now when we talk about surgery I have a very well-known that many times people and surgical time will have recurrence of atrial fibrillation.  So after that I will reduce her amiodarone to 100 mg daily.  Continue anticoagulation instruction has been given already to her about holding anticoagulation 48 hours before surgery. Mixed dyslipidemia: I do not have any recent fasting lipid profile we will continue monitoring I will request copy of report. Pacemaker interrogation I did review, has about 6.5 to 6.8 years left in the device.  Parameters appropriate, no recurrences of atrial fibrillation Cardiovascular evaluation before breast surgery it cannot be lumpectomy with lymph node resection.  Overall because of  cardiomyopathy she is at moderate risk for complication from cardiac standpoint review.  We need to be very careful and meticulous about management of fluids.  Will try to get echocardiogram before which will help [place] to determine how aggressive we can be with fluids and he  Seen here today with soft puffy  swelling in her left hand.  However, they have also been managing cellulitis and infection in her  left axillary after her breast surgery Several recent visits at surgical center, most recently on 6/24- sen By Puja Maczis PA-C Sheila Schmidt is a 87 y.o. female who underwent left breast lumpectomy and sentinel lymph node biopsy on 09/11/2022 by Dr. Luisa Hart. She is presenting to the office today with concerns of redness, swelling, and warmth near the axillary incision. She states she had a temperature of 100.5 F 2 days ago. She had PT earlier this week and woke up feeling sore the following day. On 11/23/2022, she presented with left axillary cellulitis and fluctuance. 180 cc cloudy fluid was aspirated and she was prescribed doxycycline. She came for recheck on 11/26/2022 at which time the redness had worsened. I aspirated 120 cc clear noninfected fluid and sent it for culture. I also made a 1 cm opening in the skin and placed a wick. We switched her antibiotics to Augmentin, due to drug interaction with Bactrim. Culture showed light growth of Staphylococcus lugdunesis. She saw Dr. Mosetta Putt on 11/27/2022 who ordered blood work. WBC was 11.0. Blood culture was obtained which shows no growth after 5 days. She was complaining of low back pain and dark urine so UA was also obtained which shows small amount of hemoglobin, ketones, protein, trace leukocytes, and rare bacteria. No growth after 5 days. On recheck 11/29/22, she had improvement in redness and pain without fevers. Some clear drainage from small opening. She continued Augmentin. Recheck 12/03/22 She is presenting to the office for recheck after being seen 4 days ago. She states her pain is stable. She finished her last dose of Augmentin this morning. She states it is still draining fluid and she has been having to keep the area covered but the tape is irritating her skin. Denies fever.  Her hand just started swelling 5 days ago She also has some swelling of her  ankles again   She is on augmentin still- Ms. Maczis extended her Augmentin by 7 days on 6/24  They are changing her from prozac to effexor due to drug interactions  Plan to be on Tamoxifen for 5 years  Aylani is not feeling ill but is sick of MD office -she feels overwhelmed by all of her health issues as of late  She did start on a 1/2 tablet of lasix yesterday; she has 20 mg on hand  Her weight is up consistent with fluid retention  Wt Readings from Last 3 Encounters:  12/06/22 187 lb 6.4 oz (85 kg)  11/27/22 182 lb 14.4 oz (83 kg)  11/09/22 176 lb 12.8 oz (80.2 kg)    Patient Active Problem List   Diagnosis Date Noted   Genetic testing 09/18/2022   Pacemaker Abbott device BiV 09/06/2022   Malignant neoplasm of upper-outer quadrant of left breast in female, estrogen receptor positive (HCC) 08/06/2022   Pure hypercholesterolemia 05/15/2022   Skin cancer 03/07/2022   Closed nondisplaced fracture of distal phalanx of right thumb 02/15/2022   Atrial fibrillation (HCC) 12/04/2021   Cancer (HCC) 05/25/2021   Left bundle branch block 04/12/2021   Dilated cardiomyopathy (HCC) 04/12/2021  Vasculitis (HCC) 02/22/2021   Anxiety state 02/12/2021   Body fluid retention 02/12/2021   Cervical intraepithelial neoplasia 02/12/2021   Insomnia 02/12/2021   Pruritus of vulva 02/12/2021   Leg wound, right 10/03/2020   Bilateral primary osteoarthritis of knee 03/30/2019   Paroxysmal atrial fibrillation (HCC) 04/07/2015   Chronic anticoagulation 04/07/2015   Paroxysmal atrial flutter (HCC) 03/16/2015   Essential hypertension, benign 02/24/2014   Sciatica 12/08/2013   Hyperlipidemia 09/25/2013   Osteopenia 11/19/2012   Low back pain 01/26/2010   Carcinoma in situ of breast 09/04/2009   DEPRESSION, PROLONGED 09/04/2009   OA (osteoarthritis) of knee 09/04/2009   PERIPHERAL EDEMA 09/04/2009   COLONIC POLYPS, ADENOMATOUS, HX OF 09/04/2009   Malignant tumor of breast (HCC) 06/11/2009     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   Dysrhythmia    Afib   Heart murmur    Hx of adenomatous colonic polyps    Hyperlipidemia    Presence of permanent cardiac pacemaker     Past Surgical History:  Procedure Laterality Date   ATRIAL FLUTTER ABLATION     BIV PACEMAKER INSERTION CRT-P N/A 12/04/2021   Procedure: BIV PACEMAKER INSERTION CRT-P;  Surgeon: Lanier Prude, MD;  Location: [facility] INVASIVE CV LAB;  Service: Cardiovascular;  Laterality: N/A;   BREAST BIOPSY Left 07/30/2022   [place] LT BREAST BX W LOC DEV 1ST LESION IMG BX SPEC US GUIDE 07/30/2022 [facility]   BREAST BIOPSY Left 09/10/2022   [place] LT RADIOACTIVE SEED LOC 09/10/2022 [facility]MO[facility]   BREAST BIOPSY Left 09/10/2022   [place] LT RADIOACTIVE SEED LOC 09/10/2022 [facility]AMMO[facility]   BREAST EXCISIONAL BIOPSY Left 2010   benign   BREAST LUMPECTOMY WITH RADIOACTIVE SEED AND SENTINEL LYMPH NODE BIOPSY Left 09/11/2022   Procedure: LEFT BREAST LUMPECTOMY WITH RADIOACTIVE SEED;  Surgeon: Harriette Bouillon, MD;  Location: [facility] OR;  Service: General;  Laterality: Left;   CARDIOVERSION  05/2020   in [facility]   CARDIOVERSION N/A 06/21/2021   Procedure: CARDIOVERSION;  Surgeon: Sande Rives, MD;  Location: [facility] ENDOSCOPY;  Service: Cardiovascular;  Laterality: N/A;   CARDIOVERSION N/A 03/26/2022   Procedure: CARDIOVERSION;  Surgeon: Lewayne Bunting, MD;  Location: [facility] ENDOSCOPY;  Service: Cardiovascular;  Laterality: N/A;   CHOLECYSTECTOMY     CHOLECYSTECTOMY, LAPAROSCOPIC  '06   [name]   KNEE ARTHROSCOPY     Left '00 [place]/ Right '08   lumpectomy- remote     benign   RADIOACTIVE SEED GUIDED AXILLARY SENTINEL LYMPH NODE Left 09/11/2022   Procedure: RADIOACTIVE SEED GUIDED LEFT SENTINEL LYMPH NODE BIOPSY;  Surgeon: Harriette Bouillon, MD;  Location: [facility] OR;  Service: General;  Laterality: Left;    TOOTH EXTRACTION     TOTAL KNEE ARTHROPLASTY  02/05/2011   left    Social History   Tobacco Use   Smoking status: Never    Passive exposure: Never   Smokeless tobacco: Never  Vaping Use   Vaping Use: Never used  Substance Use Topics   Alcohol use: Yes    Comment: 1 glass per day   Drug use: No    Family History  Problem Relation Age of Onset   Diabetes Mother        Deceased in 22s   Heart disease Mother  cad/MI-fatal   Heart attack Mother    Liver cancer Father 6   Heart disease Sister    Breast cancer Neg Hx    Colon cancer Neg Hx     No Known Allergies  Medication list has been reviewed and updated.  Current Outpatient Medications on File Prior to Visit  Medication Sig Dispense Refill   acetaminophen (TYLENOL) 500 MG tablet Take 500-1,000 mg by mouth every 8 (eight) hours as needed for moderate pain.     acetaminophen-codeine (TYLENOL #3) 300-30 MG tablet Take 1 tablet by mouth every 6 (six) hours as needed for moderate pain. 30 tablet 0   amiodarone (PACERONE) 200 MG tablet Take 1 tablet (200 mg total) by mouth daily. 90 tablet 2   atorvastatin (LIPITOR) 20 MG tablet TAKE 1 TABLET BY MOUTH DAILY (Patient taking differently: Take 20 mg by mouth at bedtime.) 90 tablet 3   Calcium Carbonate-Vit D-Min (CALTRATE 600+D PLUS MINERALS) 600-800 MG-UNIT CHEW Chew 1 each by mouth daily.     Cholecalciferol 50 MCG (2000 UT) TABS Take 2,000 Units by mouth daily.     ENTRESTO 24-26 MG TAKE 1 TABLET BY MOUTH TWICE  DAILY 180 tablet 3   famotidine (PEPCID) 40 MG tablet Take 40 mg by mouth daily as needed for heartburn or indigestion.     furosemide (LASIX) 20 MG tablet Start with a 1/2 tablet daily as needed for swelling, can go up to a whole tablet/ 20 mg if needed 30 tablet 3   melatonin 5 MG TABS Take 1.25 mg by mouth at bedtime as needed (sleep).     tamoxifen (NOLVADEX) 20 MG tablet Take 1 tablet (20 mg total) by mouth daily. 30 tablet 5   venlafaxine XR (EFFEXOR-XR)  37.5 MG 24 hr capsule Take 1 capsule (37.5 mg total) by mouth daily with breakfast. 30 capsule 2   vitamin B-12 (CYANOCOBALAMIN) 100 MCG tablet Take 100 mcg by mouth daily.     XARELTO 20 MG TABS tablet TAKE 1 TABLET BY MOUTH DAILY  WITH SUPPER 90 tablet 3   No current facility-administered medications on file prior to visit.    Review of Systems:  As per HPI- otherwise negative.   Physical Examination: Vitals:   12/06/22 0959  BP: 124/60  Pulse: 61  Resp: 18  Temp: 97.8 F (36.6 C)  SpO2: 98%   Vitals:   12/06/22 0959  Weight: 187 lb 6.4 oz (85 kg)  Height: 5\' 2"  (1.575 m)   Body mass index is 34.28 kg/m. Ideal Body Weight: Weight in (lb) to have BMI = 25: 136.4  GEN: no acute distress. HEENT: Atraumatic, Normocephalic.  Ears and Nose: No external deformity. CV: RRR, No M/G/R. No JVD. No thrill. No extra heart sounds. PULM: CTA B, no wheezes, crackles, rhonchi. No retractions. No resp. distress. No accessory muscle use. ABD: S, NT, ND, +BS. No rebound. No HSM. EXTR: No c/c/e PSYCH: Normally interactive. Conversant.  Lower extremity edema- trace to 1+ bilaterally Diffuse soft swelling of the left hand.  There is not appear to be any infection in the hand, no pain or tenderness with palpation or range of motion.  Normal strength of the left upper extremity Left axilla- compared with photos from 6/24 this is getting worse but it is better than when she first presented on the 14th  Assessment and Plan: Lower extremity edema - Plan: B Nat Peptide, CANCELED: B Nat Peptide  Hypokalemia - Plan: Basic metabolic panel, CANCELED: Basic metabolic panel  Cellulitis of left axilla - Plan: CBC  Edema of left upper arm - Plan: US Venous Img Upper Uni Left (DVT)  Edema of hand - Plan: US Venous Img Upper Uni Left (DVT)  Patient seen today with concern of persistent infection of her left axillary area, left hand swelling, mild edema of both lower extremities Suspect the 2 areas  of edema are not related, likely lower extremity edema is due to venous insufficiency and some element of heart failure.  Suspect her left hand swelling is related to axillary infection  I called [facility] and was able to get an appointment for the patient tomorrow.  They did not recommend an antibiotic change at this time. In the meantime we will get lab work and ultrasound to rule out DVT of the arm  Signed Abbe Amsterdam, MD  Received her labs and [place] report Message to patient and also to her cardiologist  Addendum, 6/28.  Received message from patient asking about her lab results.  Upon review, I do not see the message I sent to patient yesterday, nor do I see the message I sent to Dr. Bing Matter Will follow-up with them right away  Results for orders placed or performed in visit on 12/06/22  CBC  Result Value Ref Range   WBC 9.6 4.0 - 10.5 K/uL   RBC 3.77 (L) 3.87 - 5.11 Mil/uL   Platelets 272.0 150.0 - 400.0 K/uL   Hemoglobin 12.4 12.0 - 15.0 g/dL   HCT 64.4 03.4 - 74.2 %   MCV 102.0 (H) 78.0 - 100.0 fl   MCHC 32.2 30.0 - 36.0 g/dL   RDW 59.5 63.8 - 75.6 %  B Nat Peptide  Result Value Ref Range   Pro B Natriuretic peptide (BNP) 343.0 (H) 0.0 - 100.0 pg/mL  Basic metabolic panel  Result Value Ref Range   Sodium 140 135 - 145 mEq/L   Potassium 3.9 3.5 - 5.1 mEq/L   Chloride 102 96 - 112 mEq/L   CO2 32 19 - 32 mEq/L   Glucose, Bld 85 70 - 99 mg/dL   BUN 12 6 - 23 mg/dL   Creatinine, Ser 4.33 0.40 - 1.20 mg/dL   GFR 29.51 (L) >88.41 mL/min   Calcium 9.1 8.4 - 10.5 mg/dL    US Venous Img Upper Uni Left (DVT)  Result Date: 12/06/2022 CLINICAL DATA:  87 year old female with history of left axillary tumor resection with left upper extremity swelling. Evaluate for DVT. EXAM: LEFT UPPER EXTREMITY VENOUS DOPPLER ULTRASOUND TECHNIQUE: Gray-scale sonography with graded compression, as well as color Doppler and duplex ultrasound were performed to evaluate the upper  extremity deep venous system from the level of the subclavian vein and including the jugular, axillary, basilic, radial, ulnar and upper cephalic vein. Spectral Doppler was utilized to evaluate flow at rest and with distal augmentation maneuvers. COMPARISON:  05/23/2022 FINDINGS: Contralateral Subclavian Vein: Respiratory phasicity is normal and symmetric with the symptomatic side. No evidence of thrombus. Normal compressibility. Internal Jugular Vein: No evidence of thrombus. Normal compressibility, respiratory phasicity and response to augmentation. Subclavian Vein: No evidence of thrombus. Normal respiratory phasicity and response to augmentation. Axillary Vein: No evidence of thrombus. Normal compressibility, respiratory phasicity and response to augmentation. Cephalic Vein: No evidence of thrombus. Normal compressibility, respiratory phasicity and response to augmentation. Basilic Vein: No evidence of thrombus. Normal compressibility, respiratory phasicity and response to augmentation. Brachial Veins: No evidence of thrombus. Normal compressibility, respiratory phasicity and response to augmentation. Radial Veins: No  evidence of thrombus. Normal compressibility, respiratory phasicity and response to augmentation. Ulnar Veins: No evidence of thrombus. Normal compressibility, respiratory phasicity and response to augmentation. Other Findings:  None visualized. IMPRESSION: No evidence of left upper extremity deep vein thrombosis. Marliss Coots, MD [facility] Electronically Signed   By: Marliss Coots M.D.   On: 12/06/2022 14:51

## 2022-12-06 NOTE — Patient Instructions (Addendum)
I am sorry you are having such a hard time!  I will be in touch with your labs asap We will likely want to sue some furosemide for your leg swelling- I want to check your labs first but will get these back later today  Let's also get an [place] to make sure no sign of a blood clot in the left arm- scheduled at 2pm   Please see Puja tomorrow at 9:15 at [facility]  Continue the Augmentin for now

## 2022-12-07 ENCOUNTER — Encounter: Payer: Self-pay | Admitting: Family Medicine

## 2022-12-10 ENCOUNTER — Telehealth: Payer: Self-pay

## 2022-12-10 NOTE — Telephone Encounter (Addendum)
Called patient and relayed the message below. Patient voiced full understanding.    ----- Message from Malachy Mood, MD sent at 12/09/2022  5:03 PM EDT ----- Please let pt know her urine culture was negative on 6/18, no need antibiotics, sorry for the delay due to my out of office, thx   Malachy Mood  12/09/2022

## 2022-12-11 ENCOUNTER — Encounter: Payer: Self-pay | Admitting: Family Medicine

## 2022-12-11 DIAGNOSIS — Z5181 Encounter for therapeutic drug level monitoring: Secondary | ICD-10-CM

## 2022-12-12 NOTE — Addendum Note (Signed)
Addended by: Abbe Amsterdam C on: 12/12/2022 08:35 AM   Modules accepted: Orders

## 2022-12-19 ENCOUNTER — Other Ambulatory Visit: Payer: Self-pay

## 2022-12-19 MED ORDER — VENLAFAXINE HCL ER 37.5 MG PO CP24: 37.5000 mg | ORAL_CAPSULE | Freq: Every day | ORAL | 1 refills | Status: DC

## 2022-12-20 ENCOUNTER — Other Ambulatory Visit: Payer: Medicare Other

## 2022-12-20 DIAGNOSIS — Z5181 Encounter for therapeutic drug level monitoring: Secondary | ICD-10-CM

## 2022-12-20 LAB — BASIC METABOLIC PANEL
BUN: 13 mg/dL (ref 7–25)
CO2: 29 mmol/L (ref 20–32)
Calcium: 9.2 mg/dL (ref 8.6–10.4)
Chloride: 104 mmol/L (ref 98–110)
Creat: 0.94 mg/dL (ref 0.60–0.95)
Glucose, Bld: 90 mg/dL (ref 65–99)
Potassium: 4.2 mmol/L (ref 3.5–5.3)
Sodium: 139 mmol/L (ref 135–146)

## 2022-12-20 NOTE — Addendum Note (Signed)
Addended by: Maximino Sarin on: 12/20/2022 02:56 PM   Modules accepted: Orders

## 2022-12-21 ENCOUNTER — Encounter: Payer: Self-pay | Admitting: Family Medicine

## 2022-12-28 NOTE — Progress Notes (Signed)
Remote pacemaker transmission.   

## 2023-01-01 ENCOUNTER — Encounter: Payer: Self-pay | Admitting: Family Medicine

## 2023-01-02 ENCOUNTER — Encounter: Payer: Self-pay | Admitting: Physical Therapy

## 2023-01-02 ENCOUNTER — Encounter: Payer: Self-pay | Admitting: Hematology

## 2023-01-15 DIAGNOSIS — E785 Hyperlipidemia, unspecified: Secondary | ICD-10-CM | POA: Insufficient documentation

## 2023-01-15 DIAGNOSIS — I10 Essential (primary) hypertension: Secondary | ICD-10-CM | POA: Insufficient documentation

## 2023-01-16 ENCOUNTER — Ambulatory Visit: Payer: Medicare Other | Admitting: Cardiology

## 2023-01-24 ENCOUNTER — Ambulatory Visit: Payer: Medicare Other | Attending: Cardiology | Admitting: Cardiology

## 2023-01-24 ENCOUNTER — Encounter: Payer: Self-pay | Admitting: Cardiology

## 2023-01-24 VITALS — BP 120/70 | HR 60 | Ht 62.0 in | Wt 176.0 lb

## 2023-01-24 DIAGNOSIS — I480 Paroxysmal atrial fibrillation: Secondary | ICD-10-CM

## 2023-01-24 DIAGNOSIS — I5189 Other ill-defined heart diseases: Secondary | ICD-10-CM

## 2023-01-24 DIAGNOSIS — D6859 Other primary thrombophilia: Secondary | ICD-10-CM | POA: Diagnosis not present

## 2023-01-24 DIAGNOSIS — I10 Essential (primary) hypertension: Secondary | ICD-10-CM | POA: Diagnosis not present

## 2023-01-24 DIAGNOSIS — Z950 Presence of cardiac pacemaker: Secondary | ICD-10-CM | POA: Diagnosis not present

## 2023-01-24 DIAGNOSIS — Z79899 Other long term (current) drug therapy: Secondary | ICD-10-CM

## 2023-01-24 DIAGNOSIS — E782 Mixed hyperlipidemia: Secondary | ICD-10-CM

## 2023-01-24 DIAGNOSIS — R0602 Shortness of breath: Secondary | ICD-10-CM

## 2023-01-24 MED ORDER — AMIODARONE HCL 100 MG PO TABS: 100.0000 mg | ORAL_TABLET | Freq: Every day | ORAL | 3 refills | Status: DC

## 2023-01-24 NOTE — Patient Instructions (Signed)
Medication Instructions:  Your physician has recommended you make the following change in your medication:   Decrease your Amiodarone to 100 mg daily.  *If you need a refill on your cardiac medications before your next appointment, please call your pharmacy*   Lab Work: Your physician recommends that you have a CMP, TSH and proBNP today in the office.  If you have labs (blood work) drawn today and your tests are completely normal, you will receive your results only by: MyChart Message (if you have MyChart) OR A paper copy in the mail If you have any lab test that is abnormal or we need to change your treatment, we will call you to review the results.   Testing/Procedures: None ordered   Follow-Up: At [facility], you and your health needs are our priority.  As part of our continuing mission to provide you with exceptional heart care, we have created designated Provider Care Teams.  These Care Teams include your primary Cardiologist (physician) and Advanced Practice Providers (APPs -  Physician Assistants and Nurse Practitioners) who all work together to provide you with the care you need, when you need it.  We recommend signing up for the patient portal called "MyChart".  Sign up information is provided on this After Visit Summary.  MyChart is used to connect with patients for Virtual Visits (Telemedicine).  Patients are able to view lab/test results, encounter notes, upcoming appointments, etc.  Non-urgent messages can be sent to your provider as well.   To learn more about what you can do with MyChart, go to [email or web address].    Your next appointment:   4 month(s)  The format for your next appointment:   In Person  Provider:   Gypsy Balsam, MD    Other Instructions none  Important Information About Sugar

## 2023-01-24 NOTE — Progress Notes (Signed)
Cardiology Office Note:  .   Date:  01/24/2023  ID:  Sheila Schmidt, DOB 06-10-1936, MRN [id number] PCP: Pearline Cables, MD  [facility] Providers Cardiologist:  None Electrophysiologist:  Sheila Prude, MD    History of Present Illness: .   Sheila Schmidt is a 87 y.o. female with a past medical history of HTN, paroxysmal atrial flutter, BIV-P PPM, paroxysmal atrial fibrillation, LBBB, hx of breast cancer, HDL, dyslipidemia.   09/06/2022 echo EF 55 to 60%, grade 2 DD, biatrial dilatation, mild MR, mild calcification of the aortic valve without stenosis 03/26/22 successful DCCV 12/04/21 implantation of MERLIN PPM ST JUDE-CL   Evaluated by EP on 10/02/22, recovering from lumpectomy for breast cancer.   She presents today accompanied by her daughter for follow-up of her PAF.  She has had a very tough summer dealing with her recurrent breast cancer, she underwent a lumpectomy however this was complicated and required multiple visits to the surgeon for drainage.  Over the last 2 weeks, she states that her energy has begun to slowly come back.  She does endorse some shortness of breath at times, as well as pedal edema. She denies chest pain, dyspnea, pnd, orthopnea, n, v, dizziness, syncope,weight gain, or early satiety.    ROS: Review of Systems  Constitutional:  Positive for malaise/fatigue (slowly improving).  HENT: Negative.    Eyes: Negative.   Respiratory: Negative.    Cardiovascular:  Positive for leg swelling.  Gastrointestinal: Negative.   Genitourinary: Negative.   Musculoskeletal:  Positive for joint pain.  Skin: Negative.   Neurological: Negative.   Endo/Heme/Allergies:  Bruises/bleeds easily.  Psychiatric/Behavioral: Negative.       Studies Reviewed: .        Cardiac Studies & Procedures     STRESS TESTS  MYOCARDIAL PERFUSION IMAGING 12/22/2015  Narrative  There was no ST segment deviation noted during stress.  This is a low risk study. Mild  anteroseptal decreased radiotracer uptake seen at both rest and stress consistent with left bundle branch block artifact. No ischemia identified.  Atrial fibrillation-non-gated study, no ejection fraction calculated.  Sheila Schultz, MD   ECHOCARDIOGRAM  ECHOCARDIOGRAM COMPLETE 09/06/2022  Narrative ECHOCARDIOGRAM REPORT    Patient Name:   Sheila Schmidt [facility] Date of Exam: 09/06/2022 Medical Rec #:  [id number]       Height:       62.0 in Accession #:    [id number]      Weight:       176.0 lb Date of Birth:  Jan 31, 1936       BSA:          1.811 m Patient Age:    86 years        BP:           124/72 mmHg Patient Gender: F               HR:           75 bpm. Exam Location:  [place]  Procedure: 2D Echo, Cardiac Doppler and Color Doppler  Indications:    Dilated cardiomyopathy (HCC) [I42.0 (ICD-10-CM)]. Pre-operative cardiovascular examination Z01.810  History:        Patient has prior history of Echocardiogram examinations, most recent 11/23/2021. Pacemaker, Arrythmias:Atrial Fibrillation and LBBB, Signs/Symptoms:Murmur; Risk Factors:Dyslipidemia.  Sonographer:    Sheila Schmidt RDCS Referring Phys: [id number] Sheila Schmidt   Sonographer Comments: Global longitudinal strain was attempted. IMPRESSIONS   1. Left ventricular ejection fraction, by  estimation, is 55 to 60%. The left ventricle has normal function. The left ventricle has no regional wall motion abnormalities. Left ventricular diastolic parameters are consistent with Grade II diastolic dysfunction (pseudonormalization). 2. Right ventricular systolic function is normal. The right ventricular size is normal. There is severely elevated pulmonary artery systolic pressure. 3. Left atrial size was moderately dilated. 4. Right atrial size was moderately dilated. 5. The mitral valve is normal in structure. Mild mitral valve regurgitation. No evidence of mitral stenosis. 6. The aortic valve is calcified. There is mild  calcification of the aortic valve. There is mild thickening of the aortic valve. Aortic valve regurgitation is mild. No aortic stenosis is present. 7. The inferior vena cava is normal in size with greater than 50% respiratory variability, suggesting right atrial pressure of 3 mmHg.  FINDINGS Left Ventricle: Left ventricular ejection fraction, by estimation, is 55 to 60%. The left ventricle has normal function. The left ventricle has no regional wall motion abnormalities. The left ventricular internal cavity size was normal in size. There is no left ventricular hypertrophy. Left ventricular diastolic parameters are consistent with Grade II diastolic dysfunction (pseudonormalization).  Right Ventricle: The right ventricular size is normal. No increase in right ventricular wall thickness. Right ventricular systolic function is normal. There is severely elevated pulmonary artery systolic pressure. The tricuspid regurgitant velocity is 4.06 m/s, and with an assumed right atrial pressure of 3 mmHg, the estimated right ventricular systolic pressure is 68.9 mmHg.  Left Atrium: Left atrial size was moderately dilated.  Right Atrium: Right atrial size was moderately dilated.  Pericardium: There is no evidence of pericardial effusion.  Mitral Valve: The mitral valve is normal in structure. Mild mitral valve regurgitation. No evidence of mitral valve stenosis.  Tricuspid Valve: The tricuspid valve is normal in structure. Tricuspid valve regurgitation is mild . No evidence of tricuspid stenosis.  Aortic Valve: The aortic valve is calcified. There is mild calcification of the aortic valve. There is mild thickening of the aortic valve. Aortic valve regurgitation is mild. Aortic regurgitation PHT measures 1347 msec. No aortic stenosis is present.  Pulmonic Valve: The pulmonic valve was normal in structure. Pulmonic valve regurgitation is mild. No evidence of pulmonic stenosis.  Aorta: The aortic root is  normal in size and structure.  Venous: The inferior vena cava is normal in size with greater than 50% respiratory variability, suggesting right atrial pressure of 3 mmHg.  IAS/Shunts: No atrial level shunt detected by color flow Doppler.   LEFT VENTRICLE PLAX 2D LVIDd:         4.70 cm   Diastology LVIDs:         2.90 cm   LV e' medial:    7.51 cm/s LV PW:         0.90 cm   LV E/e' medial:  16.0 LV IVS:        1.00 cm   LV e' lateral:   10.10 cm/s LVOT diam:     1.80 cm   LV E/e' lateral: 11.9 LV SV:         68 LV SV Index:   38 LVOT Area:     2.54 cm   RIGHT VENTRICLE             IVC RV Basal diam:  3.20 cm     IVC diam: 1.70 cm RV Mid diam:    2.70 cm RV S prime:     12.30 cm/s TAPSE (M-mode): 2.5 cm  LEFT ATRIUM  Index        RIGHT ATRIUM           Index LA diam:        4.80 cm 2.65 cm/m   RA Area:     24.80 cm LA Vol (A2C):   66.9 ml 36.95 ml/m  RA Volume:   72.50 ml  40.04 ml/m LA Vol (A4C):   76.5 ml 42.25 ml/m LA Biplane Vol: 73.4 ml 40.54 ml/m AORTIC VALVE             PULMONIC VALVE LVOT Vmax:   125.00 cm/s PR End Diast Vel: 3.19 msec LVOT Vmean:  79.500 cm/s LVOT VTI:    0.267 m AI PHT:      1347 msec  AORTA Ao Root diam: 3.20 cm Ao Asc diam:  3.50 cm Ao Desc diam: 2.50 cm  MITRAL VALVE                TRICUSPID VALVE MV Area (PHT): 4.65 cm     TR Peak grad:   65.9 mmHg MV Decel Time: 163 msec     TR Vmax:        406.00 cm/s MR Peak grad: 87.2 mmHg MR Mean grad: 66.0 mmHg     SHUNTS MR Vmax:      467.00 cm/s   Systemic VTI:  0.27 m MR Vmean:     393.0 cm/s    Systemic Diam: 1.80 cm MV E velocity: 120.00 cm/s MV A velocity: 31.80 cm/s MV E/A ratio:  3.77  Gypsy Balsam MD Electronically signed by Gypsy Balsam MD Signature Date/Time: 09/06/2022/3:25:55 PM    Final    MONITORS  LONG TERM MONITOR (3-14 DAYS) 08/10/2021  Narrative Patch Wear Time:  13 days and 21 hours (2023-02-08T14:38:01-499 to  2023-02-22T12:18:48-0500)  Patient had a min HR of 31 bpm, max HR of 126 bpm, and avg HR of 51 bpm. Predominant underlying rhythm was Sinus Rhythm. 3 Supraventricular Tachycardia runs occurred, the run with the fastest interval lasting 12 beats with a max rate of 126 bpm (avg 114 bpm); the run with the fastest interval was also the longest. Atrial Fibrillation/Flutter occurred (1% burden), ranging from 57-108 bpm (avg of 88 bpm), the longest lasting 3 hours 48 mins with an avg rate of 88 bpm. Atrial Flutter may be possible Atrial Tachycardia with variable block. Isolated SVEs were rare (<1.0%), SVE Couplets were rare (<1.0%), and SVE Triplets were rare (<1.0%). Isolated VEs were rare (<1.0%), and no VE Couplets or VE Triplets were present.  Summary and conclusions: Episode of atrial fibrillation total burden of 1% with average heart rate of 88, total duration of atrial fibrillation 3 hours 48 minutes. 3 episode of supraventricular tachycardia.           Risk Assessment/Calculations:    CHA2DS2-VASc Score = 5   This indicates a 7.2% annual risk of stroke. The patient's score is based upon: CHF History: 1 HTN History: 1 Diabetes History: 0 Stroke History: 0 Vascular Disease History: 0 Age Score: 2 Gender Score: 1            Physical Exam:   VS:  BP 120/70 (BP Location: Right Arm, Patient Position: Sitting, Cuff Size: Normal)   Pulse 60   Ht 5\' 2"  (1.575 m)   Wt 176 lb (79.8 kg)   SpO2 94%   BMI 32.19 kg/m    Wt Readings from Last 3 Encounters:  01/24/23 176 lb (79.8 kg)  12/06/22 187 lb 6.4 oz (  85 kg)  11/27/22 182 lb 14.4 oz (83 kg)    GEN: Well nourished, well developed in no acute distress NECK: No JVD; No carotid bruits CARDIAC: RRR, no murmurs, rubs, gallops RESPIRATORY:  Clear to auscultation without rales, wheezing or rhonchi  ABDOMEN: Soft, non-tender, non-distended EXTREMITIES:  trace pedal edema; No deformity   ASSESSMENT AND PLAN: .   Paroxysmal atrial  fibrillation/hypercoagulable state/on amiodarone therapy-s/p DCCV October 2023, she is in sinus rhythm today.  Continue Xarelto 20 mg daily--no indication for dose reduction based on recent lab work, will repeat CBC, CMET today.  Continue amiodarone however will decrease the dose to 100 mg daily--will check TSH and LFTs. Presence of PPM-recently diagnosed with recurrent breast cancer, there was some discussion for radiation therapy however her PPM would need to be relocated, she has decided that she does not want to proceed with radiation therapy at this time.  Device followed by EP, recent device check revealed it was functioning normally in June 2024. DOE-likely multifactorial, grade 2 DD, deconditioning from recent breast surgery that was complicated.  Will check proBNP. Hyperlipidemia-followed by PCP, continue Lipitor 20 mg daily. Grade 2 DD/pedal edema-currently on Lasix 20 mg every other day        Dispo: Follow up in 4 months with Dr. Bing Matter  Signed, Flossie Dibble, NP

## 2023-01-25 LAB — COMPREHENSIVE METABOLIC PANEL WITH GFR
ALT: 29 IU/L (ref 0–32)
AST: 27 IU/L (ref 0–40)
Albumin: 3.8 g/dL (ref 3.7–4.7)
Alkaline Phosphatase: 80 IU/L (ref 44–121)
BUN/Creatinine Ratio: 20 (ref 12–28)
BUN: 20 mg/dL (ref 8–27)
Bilirubin Total: 1.1 mg/dL (ref 0.0–1.2)
CO2: 24 mmol/L (ref 20–29)
Calcium: 9.8 mg/dL (ref 8.7–10.3)
Chloride: 103 mmol/L (ref 96–106)
Creatinine, Ser: 1.02 mg/dL — ABNORMAL HIGH (ref 0.57–1.00)
Globulin, Total: 2.4 g/dL (ref 1.5–4.5)
Glucose: 54 mg/dL — ABNORMAL LOW (ref 70–99)
Potassium: 4.3 mmol/L (ref 3.5–5.2)
Sodium: 141 mmol/L (ref 134–144)
Total Protein: 6.2 g/dL (ref 6.0–8.5)
eGFR: 54 mL/min/1.73 — ABNORMAL LOW

## 2023-01-25 LAB — TSH: TSH: 2.51 u[IU]/mL (ref 0.450–4.500)

## 2023-01-25 LAB — PRO B NATRIURETIC PEPTIDE: NT-Pro BNP: 354 pg/mL (ref 0–738)

## 2023-01-27 NOTE — Progress Notes (Signed)
CLINIC:  Survivorship   REASON FOR VISIT:  Routine follow-up post-treatment for a recent history of breast cancer.  BRIEF ONCOLOGIC HISTORY:  Oncology History Overview Note   Cancer Staging  Malignant neoplasm of upper-outer quadrant of left breast in female, estrogen receptor positive (HCC) Staging form: Breast, AJCC 8th Edition - Clinical stage from 07/30/2022: Stage IB (cT1b, cN1, cM0, G2, ER+, PR+, HER2-) - Signed by Malachy Mood, MD on 08/07/2022 Stage prefix: Initial diagnosis Histologic grading system: 3 grade system - Pathologic stage from 09/11/2022: Stage IA (pT1b, pN1a, cM0, G2, ER+, PR+, HER2-) - Signed by Malachy Mood, MD on 09/24/2022 Stage prefix: Initial diagnosis Histologic grading system: 3 grade system Residual tumor (R): R0 - None     Malignant neoplasm of upper-outer quadrant of left breast in female, estrogen receptor positive (HCC)  07/30/2022 Cancer Staging   Staging form: Breast, AJCC 8th Edition - Clinical stage from 07/30/2022: Stage IB (cT1b, cN1, cM0, G2, ER+, PR+, HER2-) - Signed by Malachy Mood, MD on 08/07/2022 Stage prefix: Initial diagnosis Histologic grading system: 3 grade system   08/06/2022 Initial Diagnosis   Malignant neoplasm of upper-outer quadrant of left breast in female, estrogen receptor positive (HCC)   08/23/2022 Genetic Testing   Negative [facility] Common Hereditary Cancers +RNA Panel.  VUS detected in PMS2 at c.791A>C (p.His264Pro) and POLE at c.444G>C (p.Leu148Phe).  Report date is 08/23/2022.    The [facility] Common Hereditary Cancers + RNA Panel includes sequencing, deletion/duplication, and RNA analysis of the following 48 genes: APC, ATM, AXIN2, BAP1, BARD1, BMPR1A, BRCA1, BRCA2, BRIP1, CDH1, CDK4*, CDKN2A*, CHEK2, CTNNA1, DICER1, EPCAM* (del/dup only), FH, GREM1* (promoter dup analysis only), HOXB13*, KIT*, MBD4*, MEN1, MLH1, MSH2, MSH3, MSH6, MUTYH, NF1, NTHL1, PALB2, PDGFRA*, PMS2, POLD1, POLE, PTEN, RAD51C, RAD51D, SDHA (sequencing only), SDHB,  SDHC, SDHD, SMAD4, SMARCA4, STK11, TP53, TSC1, TSC2, VHL.  *Genes without RNA analysis.    09/11/2022 Cancer Staging   Staging form: Breast, AJCC 8th Edition - Pathologic stage from 09/11/2022: Stage IA (pT1b, pN1a, cM0, G2, ER+, PR+, HER2-) - Signed by Malachy Mood, MD on 09/24/2022 Stage prefix: Initial diagnosis Histologic grading system: 3 grade system Residual tumor (R): R0 - None   09/11/2022 Pathology Results     FINAL MICROSCOPIC DIAGNOSIS:  A. BREAST, LEFT, LUMPECTOMY: Invasive ductal carcinoma, 0.8 cm, grade II/III Ductal carcinoma in situ: Not identified Margins, invasive: Negative     Closest, invasive: Posterior at 6 mm Margins, DCIS: N/A     Closest, DCIS: N/A Lymphovascular invasion: Suspicious for small arteriolar invasion Prognostic markers:  ER positive, PR positive, Her2 negative, Ki-67 20% % Other: N/A See oncology table  B. LYMPH NODE, LEFT AXILLA, SENTINEL, EXCISION: -  1 lymph node positive for malignancy, 18 mm in greatest dimension, with extracapsular extension (1/1).  C. LYMPH NODE, LEFT AXILLA, SENTINEL, EXCISION: -  1 lymph node positive for malignancy, 4 mm in greatest dimension (1/1).  D. LYMPH NODE, LEFT AXILLA, SENTINEL, EXCISION: -  1 lymph node negative for malignancy (0/1).  E. BREAST, LEFT ADDITIONAL MEDIAL MARGIN, EXCISION: -  Benign breast tissue, negative for malignancy. -  New additional margin thickness 7 mm.  F. BREAST, LEFT ADDITIONAL SUPERIOR MARGIN, EXCISION: -  Benign breast tissue, negative for malignancy. -  New additional margin thickness 13 mm.  G. BREAST, LEFT ADDITIONAL INFERIOR MARGIN, EXCISION: -  Benign breast tissue, negative for malignancy. -  New additional margin thickness 6 mm  H. BREAST, LEFT ADDITIONAL LATERAL MARGIN, EXCISION: -  Benign breast  tissue, negative for malignancy. -  New additional margin thickness 5 mm.      INTERVAL HISTORY:  Sheila Schmidt presents to the Survivorship Clinic today for our  initial meeting to review her survivorship care plan detailing her treatment course for breast cancer, as well as monitoring long-term side effects of that treatment, education regarding health maintenance, screening, and overall wellness and health promotion.     Overall, Sheila Schmidt reports feeling quite well since completing her radiation therapy approximately 3 months ago.  She ***    REVIEW OF SYSTEMS:  Review of Systems - Oncology Breast: Denies any new nodularity, masses, tenderness, nipple changes, or nipple discharge.      ONCOLOGY TREATMENT TEAM:  1. Surgeon:  Dr. Maisie Fus Cornett 2. Medical Oncologist: Dr. Malachy Mood  3. Radiation Oncologist: Dr. 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   Dysrhythmia    Afib   Heart murmur    Hx of adenomatous colonic polyps    Hyperlipidemia    Presence of permanent cardiac pacemaker    Past Surgical History:  Procedure Laterality Date   ATRIAL FLUTTER ABLATION     BIV PACEMAKER INSERTION CRT-P N/A 12/04/2021   Procedure: BIV PACEMAKER INSERTION CRT-P;  Surgeon: Lanier Prude, MD;  Location: [facility];  Service: Cardiovascular;  Laterality: N/A;   BREAST BIOPSY Left 07/30/2022   [place] LT BREAST BX W LOC DEV 1ST LESION IMG BX SPEC US GUIDE 07/30/2022 [facility]   BREAST BIOPSY Left 09/10/2022   [place] LT RADIOACTIVE SEED LOC 09/10/2022 [facility]   BREAST BIOPSY Left 09/10/2022   [place] LT RADIOACTIVE SEED LOC 09/10/2022 [facility]   BREAST EXCISIONAL BIOPSY Left 2010   benign   BREAST LUMPECTOMY WITH RADIOACTIVE SEED AND SENTINEL LYMPH NODE BIOPSY Left 09/11/2022   Procedure: LEFT BREAST LUMPECTOMY WITH RADIOACTIVE SEED;  Surgeon: Harriette Bouillon, MD;  Location: [facility] OR;  Service: General;  Laterality: Left;   CARDIOVERSION  05/2020   in [facility]    CARDIOVERSION N/A 06/21/2021   Procedure: CARDIOVERSION;  Surgeon: Sande Rives, MD;  Location: [facility] ENDOSCOPY;  Service: Cardiovascular;  Laterality: N/A;   CARDIOVERSION N/A 03/26/2022   Procedure: CARDIOVERSION;  Surgeon: Lewayne Bunting, MD;  Location: [facility] ENDOSCOPY;  Service: Cardiovascular;  Laterality: N/A;   CHOLECYSTECTOMY     CHOLECYSTECTOMY, LAPAROSCOPIC  '06   [name]   KNEE ARTHROSCOPY     Left '00 [place]/ Right '08   lumpectomy- remote     benign   RADIOACTIVE SEED GUIDED AXILLARY SENTINEL LYMPH NODE Left 09/11/2022   Procedure: RADIOACTIVE SEED GUIDED LEFT SENTINEL LYMPH NODE BIOPSY;  Surgeon: Harriette Bouillon, MD;  Location: [facility] OR;  Service: General;  Laterality: Left;   TOOTH EXTRACTION     TOTAL KNEE ARTHROPLASTY  02/05/2011   left     ALLERGIES:  No Known Allergies   CURRENT MEDICATIONS:  Outpatient Encounter Medications as of 01/28/2023  Medication Sig   acetaminophen (TYLENOL) 500 MG tablet Take 500-1,000 mg by mouth every 8 (eight) hours as needed for moderate pain.   acetaminophen-codeine (TYLENOL #3) 300-30 MG tablet Take 1 tablet by mouth every 6 (six) hours as needed for moderate pain.   amiodarone (PACERONE) 100 MG tablet Take 1 tablet (100 mg  total) by mouth daily.   atorvastatin (LIPITOR) 20 MG tablet TAKE 1 TABLET BY MOUTH DAILY   Calcium Carbonate-Vit D-Min (CALTRATE 600+D PLUS MINERALS) 600-800 MG-UNIT CHEW Chew 1 each by mouth daily.   Cholecalciferol 50 MCG (2000 UT) TABS Take 2,000 Units by mouth daily.   ENTRESTO 24-26 MG TAKE 1 TABLET BY MOUTH TWICE  DAILY   famotidine (PEPCID) 40 MG tablet Take 40 mg by mouth daily as needed for heartburn or indigestion.   furosemide (LASIX) 20 MG tablet Take 20 mg by mouth every other day.   melatonin 5 MG TABS Take 1.25 mg by mouth at bedtime as needed (sleep).   tamoxifen (NOLVADEX) 20 MG tablet Take 1 tablet (20 mg total) by mouth daily.   venlafaxine XR (EFFEXOR-XR) 37.5 MG 24 hr capsule Take 1  capsule (37.5 mg total) by mouth daily with breakfast.   vitamin B-12 (CYANOCOBALAMIN) 100 MCG tablet Take 100 mcg by mouth daily.   XARELTO 20 MG TABS tablet TAKE 1 TABLET BY MOUTH DAILY  WITH SUPPER   No facility-administered encounter medications on file as of 01/28/2023.     ONCOLOGIC FAMILY HISTORY:  Family History  Problem Relation Age of Onset   Diabetes Mother        Deceased in 21s   Heart disease Mother        cad/MI-fatal   Heart attack Mother    Liver cancer Father 67   Heart disease Sister    Breast cancer Neg Hx    Colon cancer Neg Hx      GENETIC COUNSELING/TESTING: Negative [facility] Common Hereditary Cancers +RNA Panel.  VUS detected in PMS2 at c.791A>C (p.His264Pro) and POLE at c.444G>C (p.Leu148Phe).  Report date is 08/23/2022.     The [facility] Common Hereditary Cancers + RNA Panel includes sequencing, deletion/duplication, and RNA analysis of the following 48 genes: APC, ATM, AXIN2, BAP1, BARD1, BMPR1A, BRCA1, BRCA2, BRIP1, CDH1, CDK4*, CDKN2A*, CHEK2, CTNNA1, DICER1, EPCAM* (del/dup only), FH, GREM1* (promoter dup analysis only), HOXB13*, KIT*, MBD4*, MEN1, MLH1, MSH2, MSH3, MSH6, MUTYH, NF1, NTHL1, PALB2, PDGFRA*, PMS2, POLD1, POLE, PTEN, RAD51C, RAD51D, SDHA (sequencing only), SDHB, SDHC, SDHD, SMAD4, SMARCA4, STK11, TP53, TSC1, TSC2, VHL.  *Genes without RNA analysis.   SOCIAL HISTORY:  Sheila Schmidt is /single/married/divorced/widowed/separated and lives alone/with her spouse/family/friend in (city), [place].  She has (#) children and they live in (city).  Ms. Stirn is currently retired/disabled/working part-time/full-time as ***.  She denies any current or history of tobacco, alcohol, or illicit drug use.     PHYSICAL EXAMINATION:  Vital Signs:  There were no vitals filed for this visit. There were no vitals filed for this visit. General: Well-nourished, well-appearing female in no acute distress.  She is unaccompanied/accompanied in clinic by her  ***** today.   HEENT: Head is normocephalic.  Pupils equal and reactive to light. Conjunctivae clear without exudate.  Sclerae anicteric. Oral mucosa is pink, moist.  Oropharynx is pink without lesions or erythema.  Lymph: No cervical, supraclavicular, or infraclavicular lymphadenopathy noted on palpation.  Cardiovascular: Regular rate and rhythm.Marland Kitchen Respiratory: Clear to auscultation bilaterally. Chest expansion symmetric; breathing non-labored.  GI: Abdomen soft and round; non-tender, non-distended. Bowel sounds normoactive.  GU: Deferred.  Neuro: No focal deficits. Steady gait.  Psych: Mood and affect normal and appropriate for situation.  Extremities: No edema. MSK: No focal spinal tenderness to palpation.  Full range of motion in bilateral upper extremities Skin: Warm and dry.  LABORATORY DATA:  None for this  visit.  DIAGNOSTIC IMAGING:  None for this visit.      ASSESSMENT AND PLAN:  Ms.. Dingess is a pleasant 87 y.o. female with Stage 1A left breast cancer, ER+/PR+/HER2-, diagnosed in (date), treated with lumpectomy, adjuvant radiation therapy, and anti-estrogen therapy with *** beginning in (date).  She presents to the Survivorship Clinic for our initial meeting and routine follow-up post-completion of treatment for breast cancer.    1. Stage 1A left breast cancer:  Ms. Beiter is continuing to recover from definitive treatment for breast cancer. She will follow-up with her medical oncologist, Dr. Lucrezia Starch in (month) /2017 with history and physical exam per surveillance protocol.  She will continue her anti-estrogen therapy with (drug). Thus far, she is tolerating the *** well, with minimal side effects. She was instructed to make Dr. Pamelia Hoit or myself aware if she begins to experience any worsening side effects of the medication and I could see her back in clinic to help manage those side effects, as needed. Though the incidence is low, there is an associated risk of  endometrial cancer with anti-estrogen therapies like Tamoxifen.  Ms. Sengupta was encouraged to contact Dr. Devona Konig or myself with any vaginal bleeding while taking Tamoxifen. Other side effects of Tamoxifen were again reviewed with her as well. Today, a comprehensive survivorship care plan and treatment summary was reviewed with the patient today detailing her breast cancer diagnosis, treatment course, potential late/long-term effects of treatment, appropriate follow-up care with recommendations for the future, and patient education resources.  A copy of this summary, along with a letter will be sent to the patient's primary care provider via mail/fax/In Basket message after today's visit.    #. Problem(s) at Visit______________  #. Bone health:  Given Ms. Guirguis's age/history of breast cancer and her current treatment regimen including anti-estrogen therapy with _______, she is at risk for bone demineralization.  Her last DEXA scan was **/**/20**, which showed (results).***  In the meantime, she was encouraged to increase her consumption of foods rich in calcium, as well as increase her weight-bearing activities.  She was given education on specific activities to promote bone health.  #. Cancer screening:  Due to Ms. Blaszczyk's history and her age, she should receive screening for skin cancers, colon cancer, and gynecologic cancers.  The information and recommendations are listed on the patient's comprehensive care plan/treatment summary and were reviewed in detail with the patient.    #. Health maintenance and wellness promotion: Ms. Pelissier was encouraged to consume 5-7 servings of fruits and vegetables per day. We reviewed the "Nutrition Rainbow" handout, as well as the handout "Take Control of Your Health and Reduce Your Cancer Risk" from the [employer] Society.  She was also encouraged to engage in moderate to vigorous exercise for 30 minutes per day most days of the week. We discussed  the [employer] fitness program, which is designed for cancer survivors to help them become more physically fit after cancer treatments.  She was instructed to limit her alcohol consumption and continue to abstain from tobacco use/***was encouraged stop smoking.     #. Support services/counseling: It is not uncommon for this period of the patient's cancer care trajectory to be one of many emotions and stressors.  We discussed an opportunity for her to participate in the next session of [facility] ("Finding Your New Normal") support group series designed for patients after they have completed treatment.   Ms. Sula was encouraged to take advantage of our many other support services  programs, support groups, and/or counseling in coping with her new life as a cancer survivor after completing anti-cancer treatment.  She was offered support today through active listening and expressive supportive counseling.  She was given information regarding our available services and encouraged to contact me with any questions or for help enrolling in any of our support group/programs.    Dispo:   -Return to cancer center 6 months   -Mammogram due in *** -Follow up with surgery *** -She is welcome to return back to the Survivorship Clinic at any time; no additional follow-up needed at this time.  -Consider referral back to survivorship as a long-term survivor for continued surveillance  A total of (30) minutes of face-to-face time was spent with this patient with greater than 50% of that time in counseling and care-coordination.   Vincent Gros, FNP-c Survivorship Program [facility] [phone]   Note: PRIMARY CARE PROVIDER Pearline Cables, MD [phone]

## 2023-01-28 ENCOUNTER — Inpatient Hospital Stay: Payer: Medicare Other | Attending: Hematology | Admitting: Nurse Practitioner

## 2023-01-28 VITALS — BP 152/72 | HR 60 | Temp 98.2°F | Resp 17 | Wt 175.4 lb

## 2023-01-28 DIAGNOSIS — M8580 Other specified disorders of bone density and structure, unspecified site: Secondary | ICD-10-CM | POA: Diagnosis not present

## 2023-01-28 DIAGNOSIS — Z79810 Long term (current) use of selective estrogen receptor modulators (SERMs): Secondary | ICD-10-CM | POA: Insufficient documentation

## 2023-01-28 DIAGNOSIS — C50412 Malignant neoplasm of upper-outer quadrant of left female breast: Secondary | ICD-10-CM | POA: Diagnosis present

## 2023-01-28 DIAGNOSIS — Z170 Estrogen receptor positive status [ER+]: Secondary | ICD-10-CM | POA: Insufficient documentation

## 2023-01-28 DIAGNOSIS — Z800 Family history of malignant neoplasm of digestive organs: Secondary | ICD-10-CM | POA: Insufficient documentation

## 2023-01-28 NOTE — Assessment & Plan Note (Signed)
pT1bN1aM0, stage IA, ER+/PR+/HER2-, G2 -She was diagnosed in February 2024 -She underwent left breast lumpectomy and sentinel lymph node biopsy, I discussed her surgical findings with patient in detail.  Surgical margins were negative -Adjuvant radiation was recommended and offered, however due to her pacemaker on the same side of her breast cancer, the ultimate decision was not to proceed with adjuvant radiation due to her pacemaker and her advanced age. -Due to her advanced age, she is not a candidate for chemotherapy, so Oncotype was not obtained. -She started adjuvant tamoxifen in August 2024 --Thus far, she is tolerating well with no negative side effects.  -screening mammogram due 07/2023 -labs and follow up have been scheduled in 04/2023

## 2023-02-14 NOTE — Progress Notes (Signed)
[facility] [address] [place] [address] Phone: [phone] Subjective:   Bruce Donath, am serving as a scribe for Dr. Antoine Primas.  I'm seeing this patient by the request  of:  Copland, Gwenlyn Found, MD  CC: right knee pain   YQM:[id number]  Sheila Schmidt is a 87 y.o. female coming in with complaint of R knee pain. Patient states pain over medial aspect. Pain is constant and is causing antalgic gait. Had zilretta injection by Dr. Jordan Likes in October 2023. Hx of breast cancer this year. Trying to do seated cycling. Did go to 2 PT sessions at [facility].      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   Dysrhythmia    Afib   Heart murmur    Hx of adenomatous colonic polyps    Hyperlipidemia    Presence of permanent cardiac pacemaker    Past Surgical History:  Procedure Laterality Date   ATRIAL FLUTTER ABLATION     BIV PACEMAKER INSERTION CRT-P N/A 12/04/2021   Procedure: BIV PACEMAKER INSERTION CRT-P;  Surgeon: Lanier Prude, MD;  [facility];  Service: Cardiovascular;  Laterality: N/A;   BREAST BIOPSY Left 07/30/2022   [place] LT BREAST BX W LOC DEV 1ST LESION IMG BX SPEC US GUIDE 07/30/2022 [facility]   BREAST BIOPSY Left 09/10/2022   [place] LT RADIOACTIVE SEED LOC 09/10/2022 [facility]   BREAST BIOPSY Left 09/10/2022   [place] LT RADIOACTIVE SEED LOC 09/10/2022 [facility]   BREAST EXCISIONAL BIOPSY Left 2010   benign   BREAST LUMPECTOMY WITH RADIOACTIVE SEED AND SENTINEL LYMPH NODE BIOPSY Left 09/11/2022   Procedure: LEFT BREAST LUMPECTOMY WITH RADIOACTIVE SEED;  Surgeon: Harriette Bouillon, MD;  Location: [facility];  Service: General;  Laterality: Left;   CARDIOVERSION  05/2020   in [facility]   CARDIOVERSION N/A 06/21/2021   Procedure: CARDIOVERSION;  Surgeon: Sande Rives, MD;  Location: [facility] ENDOSCOPY;  Service: Cardiovascular;  Laterality: N/A;   CARDIOVERSION N/A 03/26/2022   Procedure: CARDIOVERSION;  Surgeon: Lewayne Bunting, MD;  Location: [facility] ENDOSCOPY;  Service: Cardiovascular;  Laterality: N/A;   CHOLECYSTECTOMY     CHOLECYSTECTOMY, LAPAROSCOPIC  '06   [name]   KNEE ARTHROSCOPY     Left '00 [place]/ Right '08   lumpectomy- remote     benign   RADIOACTIVE SEED GUIDED AXILLARY SENTINEL LYMPH NODE Left 09/11/2022   Procedure: RADIOACTIVE SEED GUIDED LEFT SENTINEL LYMPH NODE BIOPSY;  Surgeon: Harriette Bouillon, MD;  Location: [facility] OR;  Service: General;  Laterality: Left;   TOOTH EXTRACTION     TOTAL KNEE ARTHROPLASTY  02/05/2011   left   Social History   Socioeconomic History   Marital status: Widowed    Spouse name: Not on file   Number of children: 3   Years of education: Not on file   Highest education level: GED or equivalent  Occupational History   Occupation: retired     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. Married '59, 3 daughters- '61, '64, '73; 2 grandchildren. Exercise - goes to gym 5/wk. ACP - directed to the [email or web address]   Lives with her daughter - goes between daughters home in [place] and her own home here   Social Determinants of Health   Financial Resource Strain: Low Risk  (02/12/2021)   Overall Financial Resource Strain (CARDIA)    Difficulty of Paying Living Expenses: Not hard at all  Food Insecurity: No Food Insecurity (10/02/2022)   Hunger Vital Sign    Worried About Running Out of Food in the Last Year: Never true    Ran Out of Food in the Last  Year: Never true  Transportation Needs: No Transportation Needs (10/02/2022)   PRAPARE - Administrator, Civil Service (Medical): No    Lack of Transportation (Non-Medical): No  Physical Activity: Insufficiently Active (10/02/2022)   Exercise Vital Sign    Days of Exercise per Week: 2 days    Minutes of Exercise per Session: 30 min  Stress: Stress Concern Present (10/02/2022)   [employer] of Occupational Health - Occupational Stress Questionnaire    Feeling of Stress : To some extent  Social Connections: Unknown (10/02/2022)   Social Connection and Isolation Panel [NHANES]    Frequency of Communication with Friends and Family: Three times a week    Frequency of Social Gatherings with Friends and Family: Twice a week    Attends Religious Services: Patient declined    Database administrator or Organizations: Not on file    Attends Banker Meetings: Not on file    Marital Status: Widowed   No Known Allergies Family History  Problem Relation Age of Onset   Diabetes Mother        Deceased in 64s   Heart disease Mother        cad/MI-fatal   Heart attack Mother    Liver cancer Father 60   Heart disease Sister    Breast cancer Neg Hx    Colon cancer Neg Hx      Current Outpatient Medications (Cardiovascular):    amiodarone (PACERONE) 100 MG tablet, Take 1 tablet (100 mg total) by mouth daily.   atorvastatin (LIPITOR) 20 MG tablet, TAKE 1 TABLET BY MOUTH DAILY   ENTRESTO 24-26 MG, TAKE 1 TABLET BY MOUTH TWICE  DAILY   furosemide (LASIX) 20 MG tablet, Take 20 mg by mouth every other day.   Current Outpatient Medications (Analgesics):    acetaminophen (TYLENOL) 500 MG tablet, Take 500-1,000 mg by mouth every 8 (eight) hours as needed for moderate pain.   acetaminophen-codeine (TYLENOL #3) 300-30 MG tablet, Take 1 tablet by mouth every 6 (six) hours as needed for moderate pain.  Current Outpatient Medications (Hematological):    vitamin B-12  (CYANOCOBALAMIN) 100 MCG tablet, Take 100 mcg by mouth daily.   XARELTO 20 MG TABS tablet, TAKE 1 TABLET BY MOUTH DAILY  WITH SUPPER  Current Outpatient Medications (Other):    Calcium Carbonate-Vit D-Min (CALTRATE 600+D PLUS MINERALS) 600-800 MG-UNIT CHEW, Chew 1 each by mouth daily.   Cholecalciferol 50 MCG (2000 UT) TABS, Take 2,000 Units by mouth daily.   famotidine (PEPCID) 40 MG tablet, Take 40 mg by mouth daily as needed for heartburn or indigestion.   melatonin 5 MG TABS, Take 1.25 mg  by mouth at bedtime as needed (sleep).   tamoxifen (NOLVADEX) 20 MG tablet, Take 1 tablet (20 mg total) by mouth daily.   venlafaxine XR (EFFEXOR-XR) 37.5 MG 24 hr capsule, Take 1 capsule (37.5 mg total) by mouth daily with breakfast.   Reviewed prior external information including notes and imaging from  primary care provider As well as notes that were available from care everywhere and other healthcare systems.  Past medical history, social, surgical and family history all reviewed in electronic medical record.  No pertanent information unless stated regarding to the chief complaint.   Review of Systems:  No headache, visual changes, nausea, vomiting, diarrhea, constipation, dizziness, abdominal pain, skin rash, fevers, chills, night sweats, weight loss, swollen lymph nodes, body aches, joint swelling, chest pain, shortness of breath, mood changes. POSITIVE muscle aches  Objective  Blood pressure 110/70, pulse 69, height 5\' 2"  (1.575 m), weight 179 lb (81.2 kg), SpO2 95%.   General: No apparent distress alert and oriented x3 mood and affect normal, dressed appropriately.  HEENT: Pupils equal, extraocular movements intact  Respiratory: Patient's speak in full sentences and does not appear short of breath  Cardiovascular: No lower extremity edema, non tender, no erythema  Right knee exam shows no significant abnormality noted.  Patient does have instability with valgus and varus force.  Arthritic  changes noted.  Trace effusion noted as well.   After informed written and verbal consent, patient was seated on exam table. Right knee was prepped with alcohol swab and utilizing anterolateral approach, patient's right knee space was injected with 4:1  marcaine 0.5%: Kenalog 40mg /dL. Patient tolerated the procedure well without immediate complications.    Impression and Recommendations:    The above documentation has been reviewed and is accurate and complete Judi Saa, DO

## 2023-02-20 ENCOUNTER — Encounter: Payer: Self-pay | Admitting: Family Medicine

## 2023-02-20 ENCOUNTER — Ambulatory Visit (INDEPENDENT_AMBULATORY_CARE_PROVIDER_SITE_OTHER): Payer: Medicare Other | Admitting: Family Medicine

## 2023-02-20 ENCOUNTER — Other Ambulatory Visit: Payer: Self-pay

## 2023-02-20 ENCOUNTER — Ambulatory Visit (INDEPENDENT_AMBULATORY_CARE_PROVIDER_SITE_OTHER): Payer: Medicare Other

## 2023-02-20 VITALS — BP 110/70 | HR 69 | Ht 62.0 in | Wt 179.0 lb

## 2023-02-20 DIAGNOSIS — G8929 Other chronic pain: Secondary | ICD-10-CM

## 2023-02-20 DIAGNOSIS — M25561 Pain in right knee: Secondary | ICD-10-CM | POA: Diagnosis not present

## 2023-02-20 DIAGNOSIS — M170 Bilateral primary osteoarthritis of knee: Secondary | ICD-10-CM | POA: Diagnosis not present

## 2023-02-20 NOTE — Patient Instructions (Signed)
Injected knee today Will try to get approval for gel Xray today See me in 2 months

## 2023-02-20 NOTE — Assessment & Plan Note (Addendum)
Patient given injections well noted at this point.  Discussed which activities to do and which ones to avoid.  We discussed with patient patient is ambulatory but is having instability of the knee.  Abnormal thigh to calf ratio noted and will get a custom OA stability brace that I think will be helpful.  Discussed which activities to do and which ones to avoid.  Follow-up again in 6 to 8 weeks.  Secondary to the social determinants of health and recent diagnosis of breast cancer we will also get an x-ray to further evaluate for any other bony abnormality that could be contributing.

## 2023-03-05 ENCOUNTER — Ambulatory Visit: Payer: Medicare Other

## 2023-03-05 DIAGNOSIS — I480 Paroxysmal atrial fibrillation: Secondary | ICD-10-CM | POA: Diagnosis not present

## 2023-03-06 LAB — CUP PACEART REMOTE DEVICE CHECK
Battery Remaining Longevity: 74 mo
Battery Remaining Percentage: 84 %
Battery Voltage: 2.99 V
Brady Statistic AP VP Percent: 87 %
Brady Statistic AP VS Percent: 1 %
Brady Statistic AS VP Percent: 2.4 %
Brady Statistic AS VS Percent: 10 %
Brady Statistic RA Percent Paced: 87 %
Date Time Interrogation Session: 20240924040008
Implantable Lead Connection Status: 753985
Implantable Lead Connection Status: 753985
Implantable Lead Connection Status: 753985
Implantable Lead Implant Date: 20230626
Implantable Lead Implant Date: 20230626
Implantable Lead Implant Date: 20230626
Implantable Lead Location: 753858
Implantable Lead Location: 753859
Implantable Lead Location: 753860
Implantable Pulse Generator Implant Date: 20230626
Lead Channel Impedance Value: 430 Ohm
Lead Channel Impedance Value: 530 Ohm
Lead Channel Impedance Value: 860 Ohm
Lead Channel Pacing Threshold Amplitude: 0.5 V
Lead Channel Pacing Threshold Amplitude: 0.75 V
Lead Channel Pacing Threshold Amplitude: 1.125 V
Lead Channel Pacing Threshold Pulse Width: 0.5 ms
Lead Channel Pacing Threshold Pulse Width: 0.5 ms
Lead Channel Pacing Threshold Pulse Width: 0.5 ms
Lead Channel Sensing Intrinsic Amplitude: 1 mV
Lead Channel Sensing Intrinsic Amplitude: 10.5 mV
Lead Channel Setting Pacing Amplitude: 2.125
Lead Channel Setting Pacing Amplitude: 2.5 V
Lead Channel Setting Pacing Amplitude: 2.5 V
Lead Channel Setting Pacing Pulse Width: 0.5 ms
Lead Channel Setting Pacing Pulse Width: 0.5 ms
Lead Channel Setting Sensing Sensitivity: 2 mV
Pulse Gen Model: 3562
Pulse Gen Serial Number: 8071621

## 2023-03-20 ENCOUNTER — Other Ambulatory Visit: Payer: Self-pay

## 2023-03-20 ENCOUNTER — Encounter: Payer: Self-pay | Admitting: Family Medicine

## 2023-03-20 DIAGNOSIS — M170 Bilateral primary osteoarthritis of knee: Secondary | ICD-10-CM

## 2023-03-21 NOTE — Progress Notes (Signed)
Remote pacemaker transmission.   

## 2023-03-28 ENCOUNTER — Other Ambulatory Visit: Payer: Self-pay | Admitting: Cardiology

## 2023-03-29 NOTE — Telephone Encounter (Signed)
Rx refill sent to pharmacy. 

## 2023-04-03 ENCOUNTER — Other Ambulatory Visit: Payer: Self-pay | Admitting: Family Medicine

## 2023-04-03 DIAGNOSIS — E785 Hyperlipidemia, unspecified: Secondary | ICD-10-CM

## 2023-04-03 DIAGNOSIS — I480 Paroxysmal atrial fibrillation: Secondary | ICD-10-CM

## 2023-04-04 ENCOUNTER — Other Ambulatory Visit: Payer: Self-pay

## 2023-04-04 MED ORDER — AMIODARONE HCL 100 MG PO TABS: 100.0000 mg | ORAL_TABLET | Freq: Every day | ORAL | 2 refills | Status: DC

## 2023-04-08 NOTE — Therapy (Signed)
OUTPATIENT PHYSICAL THERAPY LOWER EXTREMITY EVALUATION   Patient Name: Sheila Schmidt MRN: [id number] DOB:1935-10-29, 87 y.o., female Today's Date: 04/10/2023  END OF SESSION:  PT End of Session - 04/10/23 1029     Visit Number 1    Date for PT Re-Evaluation 06/05/23    Authorization Type [employer] Medicare    Progress Note Due on Visit 10    PT Start Time 1020    PT Stop Time 1100    PT Time Calculation (min) 40 min    Activity Tolerance Patient tolerated treatment well    Behavior During Therapy WFL for tasks assessed/performed             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   Dysrhythmia    Afib   Heart murmur    Hx of adenomatous colonic polyps    Hyperlipidemia    Presence of permanent cardiac pacemaker    Past Surgical History:  Procedure Laterality Date   ATRIAL FLUTTER ABLATION     BIV PACEMAKER INSERTION CRT-P N/A 12/04/2021   Procedure: BIV PACEMAKER INSERTION CRT-P;  Surgeon: Lanier Prude, MD;  [facility];  Service: Cardiovascular;  Laterality: N/A;   BREAST BIOPSY Left 07/30/2022   [place] LT BREAST BX W LOC DEV 1ST LESION IMG BX SPEC US GUIDE 07/30/2022 [facility]   BREAST BIOPSY Left 09/10/2022   [place] LT RADIOACTIVE SEED LOC 09/10/2022 [facility]   BREAST BIOPSY Left 09/10/2022   [place] LT RADIOACTIVE SEED LOC 09/10/2022 [facility]   BREAST EXCISIONAL BIOPSY Left 2010   benign   BREAST LUMPECTOMY WITH RADIOACTIVE SEED AND SENTINEL LYMPH NODE BIOPSY Left 09/11/2022   Procedure: LEFT BREAST LUMPECTOMY WITH RADIOACTIVE SEED;  Surgeon: Harriette Bouillon, MD;  Location[facility];  Service: General;  Laterality: Left;   CARD[facility]OVERSION  05/2020   in [facility]   CARDIOVERSION N/A 06/21/2021   Procedure: CARDIOVERSION;  Surgeon: Sande Rives, MD;  Location: [facility] ENDOSCOPY;  Service: Cardiovascular;   Laterality: N/A;   CARDIOVERSION N/A 03/26/2022   Procedure: CARDIOVERSION;  Surgeon: Lewayne Bunting, MD;  Location: [facility] ENDOSCOPY;  Service: Cardiovascular;  Laterality: N/A;   CHOLECYSTECTOMY     CHOLECYSTECTOMY, LAPAROSCOPIC  '06   [name]   KNEE ARTHROSCOPY     Left '00 [place]end[place]/ Right '08   lumpectomy- remote     benign   RADIOACTIVE SEED GUIDED AXILLARY SENTINEL LYMPH NODE Left 09/11/2022   Procedure: RADIOACTIVE SEED GUIDED LEFT SENTINEL LYMPH NODE BIOPSY;  Surgeon: Harriette Bouillon, MD;  Location: [facility];  Service: General;  Laterality: Left;   TOOTH EXTRACTION     TOTAL KNEE ARTHROPLASTY  02/05/2011   left   Patient Active Problem List   Diagnosis Date Noted   Hypertensive disorder 01/15/2023   Dyslipidemia 01/15/2023   Genetic testing 09/18/2022   Pacemaker Abbott device BiV 09/06/2022   Malignant neoplasm of upper-outer quadrant of left breast in female, estrogen receptor positive (HCC) 08/06/2022   Pure hypercholesterolemia 05/15/2022   Skin cancer 03/07/2022   Closed nondisplaced fracture of distal phalanx of right thumb 02/15/2022   Atrial fibrillation (HCC) 12/04/2021   Cancer (HCC) 05/25/2021   Left bundle branch block 04/12/2021   Dilated cardiomyopathy (HCC) 04/12/2021   Vasculitis (HCC) 02/22/2021   Anxiety state 02/12/2021  Body fluid retention 02/12/2021   Cervical intraepithelial neoplasia 02/12/2021   Insomnia 02/12/2021   Pruritus of vulva 02/12/2021   Leg wound, right 10/03/2020   Bilateral primary osteoarthritis of knee 03/30/2019   Paroxysmal atrial fibrillation (HCC) 04/07/2015   Chronic anticoagulation 04/07/2015   Paroxysmal atrial flutter (HCC) 03/16/2015   Essential hypertension, benign 02/24/2014   Sciatica 12/08/2013   Hyperlipidemia 09/25/2013   Osteopenia 11/19/2012   Low back pain 01/26/2010   Carcinoma in situ of breast 09/04/2009   DEPRESSION, PROLONGED 09/04/2009   OA (osteoarthritis) of knee 09/04/2009   PERIPHERAL EDEMA 09/04/2009    History of colonic polyps 09/04/2009    PCP: Pearline Cables, MD   REFERRING PROVIDER: Judi Saa, DO  REFERRING DIAG: M17.0 (ICD-10-CM) - Bilateral primary osteoarthritis of knee   THERAPY DIAG:  Chronic pain of right knee  Muscle weakness (generalized)  Difficulty in walking, not elsewhere classified  Rationale for Evaluation and Treatment: Rehabilitation  ONSET DATE: chronic, worsened Sept 2023  SUBJECTIVE:   SUBJECTIVE STATEMENT:   Patient reports she needs a R knee replacement but they don't want to do it due to her heart condition, she started therapy last October but then the cancer came back, so had to stop.  She has been taking the pills and has a brace for her knee which helps the pain, tends to walk with foot out to make it comfortable, the brace turns her foot in.  Before husband passed did got to gym 5 days a week, but after he died that all fell apart.  She does have a floor bike at home she uses.  She needs a push to get back into doing something.  She can't even walk now.    PERTINENT HISTORY: Afib, history of L breast cancer, L TKA 2012, osteopenia PAIN:  Are you having pain? Yes: NPRS scale: 7/10 Pain location: R knee  Pain description: sharp, shooting Aggravating factors: turning, twisting Relieving factors: putting feet up, doing floor bike, walking with hands on shopping cart  PRECAUTIONS: ICD/Pacemaker and history of breast cancer  RED FLAGS: None   WEIGHT BEARING RESTRICTIONS: No  FALLS:  Has patient fallen in last 6 months? No  LIVING ENVIRONMENT: Lives with: lives with their daughter Lives in: House/apartment Stairs:  stairs to basement apartment, rarely goes down, 4-5 steps to garage, has railing Has following equipment at home: Single point cane  OCCUPATION: retired  PLOF: Independent  PATIENT GOALS: be able to walk!  Wants to be able to walk on [place] at [facility] when visits other daughter, be motivated to exercise  again  NEXT MD VISIT: 04/23/23 Dr. Denyse Amass for injection?  OBJECTIVE:  Note: Objective measures were completed at Evaluation unless otherwise noted.  DIAGNOSTIC FINDINGS: DG R Knee 02/20/2023 FINDINGS: No evidence of fracture, dislocation, or joint effusion. There is tricompartmental joint space narrowing, sclerosis and osteophytes consistent with degenerative joint disease. Moderate suprapatellar effusion is seen.  PATIENT SURVEYS:  LEFS 41/80  COGNITION: Overall cognitive status: Within functional limits for tasks assessed     SENSATION: WFL  EDEMA:  Slight swelling in RLE compared to LLE, but not significantly different with circumferential measurement (20.5 cm bil)  MUSCLE LENGTH: NT  POSTURE: rounded shoulders and forward head  PALPATION: Tenderness R medial knee joint line, limited patellar mobility especially inf/sup direction, suprapatellar edema  LOWER EXTREMITY ROM:  Passive ROM Right eval Left eval  Knee flexion 125 126  Knee extension Lacking 5 0  Ankle dorsiflexion     (  Blank rows = not tested)  LOWER EXTREMITY MMT:  MMT Right Eval -seated Left Eval -seated  Hip flexion 4+ 4+  Hip extension 4 4  Hip abduction 5 5  Hip adduction 4+ 4+  Hip internal rotation    Hip external rotation    Knee flexion 4+ 4+  Knee extension 5 5  Ankle dorsiflexion 5 5  Ankle plantarflexion     (Blank rows = not tested)  FUNCTIONAL TESTS:  5 times sit to stand: 16 seconds  without UE assist.   GAIT: Distance walked: 50' Assistive device utilized: None Level of assistance: Complete Independence Comments: visually slow, antalgic, lacking TKE on R   TODAY'S TREATMENT:                                                                                                                              DATE:   04/10/23 EVAL Self Care: Findings, POC  Discussion of different gyms and activities - senior center, aquatic aerobics (not a fan), [employer] account planner)   PATIENT EDUCATION:  Education details: see self care Person educated: Patient Education method: Explanation Education comprehension: verbalized understanding  HOME EXERCISE PROGRAM: Access Code: [email or web address]  (prior HEP)  ASSESSMENT:  CLINICAL IMPRESSION: KIAJA DENVER is an  87 y.o. female who was seen today for physical therapy evaluation and treatment for R knee OA/chronic R knee pain.  She is not a candidate for TKA due to other health conditions.  She demonstrates R knee pain, decreased knee ROM, decreased patellar mobility, and decreased activity tolerance.  Elisabel Lesueur Sultana would benefit from skilled physical therapy to decrease pain, improve R knee strength and mobility and improve activity tolerance in order to transition to community based exercise program.   OBJECTIVE IMPAIRMENTS: Abnormal gait, decreased activity tolerance, decreased endurance, decreased mobility, difficulty walking, decreased ROM, decreased strength, hypomobility, increased edema, increased fascial restrictions, increased muscle spasms, impaired flexibility, and pain.   ACTIVITY LIMITATIONS: carrying, lifting, bending, standing, squatting, stairs, transfers, and locomotion level  PARTICIPATION LIMITATIONS: meal prep, cleaning, laundry, shopping, and community activity  PERSONAL FACTORS: Time since onset of injury/illness/exacerbation and 3+ comorbidities: Afib, history of L breast cancer, L TKA 2012, osteopenia  are also affecting patient's functional outcome.   REHAB POTENTIAL: Good  CLINICAL DECISION MAKING: Stable/uncomplicated  EVALUATION COMPLEXITY: Low   GOALS: Goals reviewed with patient? Yes  SHORT TERM GOALS: Target date: 04/24/2023   Patient will be independent with initial HEP. Baseline:  Goal status: INITIAL  LONG TERM GOALS: Target date: 06/05/2023   Patient will be independent with advanced/ongoing HEP to improve outcomes and carryover.  Baseline:  Goal status:  INITIAL  2.  Patient will report at least 50% improvement in R knee pain to improve QOL. Baseline:  Goal status: INITIAL  3.  Patient will demonstrate improved R knee extension < 5 deg to allow for normal gait and stair mechanics. Baseline: lacking 5 deg Goal status: INITIAL  4.  Patient will demonstrate improved functional LE strength as demonstrated by 5 x STS <14 sec. Baseline: 16 sec  Goal status: INITIAL  5.  Patient will report 9 points improvement on LEFS to demonstrate improved functional ability. Baseline: 41/80 Goal status: INITIAL  6.  Patient will express confidence with transition to community based exercise program. Baseline:  Goal status: INITIAL   7.  Patient will be able to walk for 10 min to start walking program.  Baseline: unable due to pain Goal status: INITIAL  PLAN:  PT FREQUENCY: 1-2x/week  PT DURATION: 8 weeks  PLANNED INTERVENTIONS: 97110-Therapeutic exercises, 97530- Therapeutic activity, 97112- Neuromuscular re-education, 97535- Self Care, 16109- Manual therapy, [id number]- Gait training, [phone]- Orthotic Fit/training, 97014- Electrical stimulation (unattended), [id number]- Electrical stimulation (manual), [id number]- Ultrasound, 09811- Ionotophoresis 4mg /ml Dexamethasone, Balance training, Stair training, Taping, Dry Needling, Joint mobilization, Joint manipulation, Cryotherapy, and Moist heat  PLAN FOR NEXT SESSION: , review HEP (had one prior), examine brace, manual therapy.  Has PACEMAKER.    Jena Gauss, PT, DPT 04/10/2023, 6:07 PM   Date of referral: 03/20/2023 Referring provider: Judi Saa, DO Referring diagnosis? M17.11 (ICD-10-CM) - Primary osteoarthritis of right knee  Treatment diagnosis? (if different than referring diagnosis) M25.561 Chronic R knee pain  What was this (referring dx) caused by? Arthritis  Nature of Condition: Chronic (continuous duration > 3 months)   Laterality: Rt  Current Functional Measure Score: LEFS  41/80  Objective measurements identify impairments when they are compared to normal values, the uninvolved extremity, and prior level of function.  [x]  Yes  []  No  Objective assessment of functional ability: Moderate functional limitations   Briefly describe symptoms: sharp shooting R knee pain with walking  How did symptoms start: chronic, worsened last year, treatment put on hiatus due to return of breast cancer and treatment  Average pain intensity:  Last 24 hours: 7/10  Past week: 7/10  How often does the pt experience symptoms? Frequently  How much have the symptoms interfered with usual daily activities? Quite a bit  How has condition changed since care began at this facility? NA - initial visit  In general, how is the patients overall health? Fair   BACK PAIN (STarT Back Screening Tool) No

## 2023-04-10 ENCOUNTER — Other Ambulatory Visit: Payer: Self-pay

## 2023-04-10 ENCOUNTER — Ambulatory Visit: Payer: Medicare Other | Attending: Family Medicine | Admitting: Physical Therapy

## 2023-04-10 ENCOUNTER — Encounter: Payer: Self-pay | Admitting: Physical Therapy

## 2023-04-10 DIAGNOSIS — M170 Bilateral primary osteoarthritis of knee: Secondary | ICD-10-CM | POA: Insufficient documentation

## 2023-04-10 DIAGNOSIS — R262 Difficulty in walking, not elsewhere classified: Secondary | ICD-10-CM | POA: Insufficient documentation

## 2023-04-10 DIAGNOSIS — G8929 Other chronic pain: Secondary | ICD-10-CM | POA: Insufficient documentation

## 2023-04-10 DIAGNOSIS — M25561 Pain in right knee: Secondary | ICD-10-CM | POA: Insufficient documentation

## 2023-04-10 DIAGNOSIS — M6281 Muscle weakness (generalized): Secondary | ICD-10-CM | POA: Diagnosis present

## 2023-04-11 NOTE — Addendum Note (Signed)
Addended by: Jena Gauss on: 04/11/2023 11:34 AM   Modules accepted: Orders

## 2023-04-16 ENCOUNTER — Encounter: Payer: Self-pay | Admitting: Cardiology

## 2023-04-18 ENCOUNTER — Encounter: Payer: Self-pay | Admitting: Physical Therapy

## 2023-04-18 ENCOUNTER — Ambulatory Visit: Payer: Medicare Other | Attending: Family Medicine | Admitting: Physical Therapy

## 2023-04-18 DIAGNOSIS — M6281 Muscle weakness (generalized): Secondary | ICD-10-CM | POA: Insufficient documentation

## 2023-04-18 DIAGNOSIS — M25561 Pain in right knee: Secondary | ICD-10-CM | POA: Insufficient documentation

## 2023-04-18 DIAGNOSIS — R2681 Unsteadiness on feet: Secondary | ICD-10-CM | POA: Diagnosis present

## 2023-04-18 DIAGNOSIS — G8929 Other chronic pain: Secondary | ICD-10-CM | POA: Diagnosis present

## 2023-04-18 DIAGNOSIS — R262 Difficulty in walking, not elsewhere classified: Secondary | ICD-10-CM | POA: Diagnosis present

## 2023-04-18 NOTE — Therapy (Signed)
OUTPATIENT PHYSICAL THERAPY LOWER EXTREMITY TREATMENT   Patient Name: Sheila Schmidt MRN: [id number] DOB:[birth year], 87 y.o., female Today's Date: 04/18/2023  END OF SESSION:  PT End of Session - 04/18/23 1536     Visit Number 2    Date for PT Re-Evaluation 06/05/23    Authorization Type [employer] Medicare    Progress Note Due on Visit 10    PT Start Time 1536    PT Stop Time 1616    PT Time Calculation (min) 40 min    Activity Tolerance Patient tolerated treatment well    Behavior During Therapy WFL for tasks assessed/performed             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   Dysrhythmia    Afib   Heart murmur    Hx of adenomatous colonic polyps    Hyperlipidemia    Presence of permanent cardiac pacemaker    Past Surgical History:  Procedure Laterality Date   ATRIAL FLUTTER ABLATION     BIV PACEMAKER INSERTION CRT-P N/A 12/04/2021   Procedure: BIV PACEMAKER INSERTION CRT-P;  Surgeon: Lanier Prude, MD;  Location: [facility];  Service: Cardiovascular;  Laterality: N/A;   BREAST BIOPSY Left 07/30/2022   [place] LT BREAST BX W LOC DEV 1ST LESION IMG BX SPEC US GUIDE 07/30/2022 [facility]   BREAST BIOPSY Left 09/10/2022   [place] LT RADIOACTIVE SEED LOC 09/10/2022 [facility]   BREAST BIOPSY Left 09/10/2022   [place] LT RADIOACTIVE SEED LOC 09/10/2022 [facility]   BREAST EXCISIONAL BIOPSY Left 2010   benign   BREAST LUMPECTOMY WITH RADIOACTIVE SEED AND SENTINEL LYMPH NODE BIOPSY Left 09/11/2022   Procedure: LEFT BREAST LUMPECTOMY WITH RADIOACTIVE SEED;  Surgeon: Harriette Bouillon, MD;  Location: [facility];  Service: General;  Laterality: Left;   CARDIOVERSION  05/2020   in [facility]   CARDIOVERSION N/A 06/21/2021   Procedure: CARDIOVERSION;  Surgeon: Sande Rives, MD;  Location: [facility];  Service: Cardiovascular;   Laterality: N/A;   CARDIOVERSION N/A 03/26/2022   Procedure: CARDIOVERSION;  Surgeon: Lewayne Bunting, MD;  Location: [facility] ENDOSCOPY;  Service: Cardiovascular;  Laterality: N/A;   CHOLECYSTECTOMY     CHOLECYSTECTOMY, LAPAROSCOPIC  '06   [name]   KNEE ARTHROSCOPY     Left '00 [place]/ Right '08   lumpectomy- remote     benign   RADIOACTIVE SEED GUIDED AXILLARY SENTINEL LYMPH NODE Left 09/11/2022   Procedure: RADIOACTIVE SEED GUIDED LEFT SENTINEL LYMPH NODE BIOPSY;  Surgeon: Harriette Bouillon, MD;  Location: [facility] OR;  Service: General;  Laterality: Left;   TOOTH EXTRACTION     TOTAL KNEE ARTHROPLASTY  02/05/2011   left   Patient Active Problem List   Diagnosis Date Noted   Hypertensive disorder 01/15/2023   Dyslipidemia 01/15/2023   Genetic testing 09/18/2022   Pacemaker [employer] device BiV 09/06/2022   Malignant neoplasm of upper-outer quadrant of left breast in female, estrogen receptor positive (HCC) 08/06/2022   Pure hypercholesterolemia 05/15/2022   Skin cancer 03/07/2022   Closed nondisplaced fracture of distal phalanx of right thumb 02/15/2022   Atrial fibrillation (HCC) 12/04/2021   Cancer (HCC) 05/25/2021   Left bundle branch block 04/12/2021   Dilated cardiomyopathy (HCC) 04/12/2021   Vasculitis (HCC) 02/22/2021   Anxiety state 02/12/2021  Body fluid retention 02/12/2021   Cervical intraepithelial neoplasia 02/12/2021   Insomnia 02/12/2021   Pruritus of vulva 02/12/2021   Leg wound, right 10/03/2020   Bilateral primary osteoarthritis of knee 03/30/2019   Paroxysmal atrial fibrillation (HCC) 04/07/2015   Chronic anticoagulation 04/07/2015   Paroxysmal atrial flutter (HCC) 03/16/2015   Essential hypertension, benign 02/24/2014   Sciatica 12/08/2013   Hyperlipidemia 09/25/2013   Osteopenia 11/19/2012   Low back pain 01/26/2010   Carcinoma in situ of breast 09/04/2009   DEPRESSION, PROLONGED 09/04/2009   OA (osteoarthritis) of knee 09/04/2009   PERIPHERAL EDEMA 09/04/2009    History of colonic polyps 09/04/2009    PCP: Pearline Cables, MD   REFERRING PROVIDER: Judi Saa, DO  REFERRING DIAG: M17.0 (ICD-10-CM) - Bilateral primary osteoarthritis of knee   THERAPY DIAG:  Chronic pain of right knee  Muscle weakness (generalized)  Difficulty in walking, not elsewhere classified  Unsteadiness on feet  Rationale for Evaluation and Treatment: Rehabilitation  ONSET DATE: chronic, worsened Sept 2023  SUBJECTIVE:   SUBJECTIVE STATEMENT:   Sheila Schmidt reports she is doing well, denies falls, brought in her Donjoy offloading brace today.     PERTINENT HISTORY: Afib, history of L breast cancer, L TKA 2012, osteopenia PAIN:  Are you having pain? Yes: NPRS scale: 6/10 Pain location: R knee  Pain description: sharp, shooting Aggravating factors: turning, twisting Relieving factors: putting feet up, doing floor bike, walking with hands on shopping cart  PRECAUTIONS: ICD/Pacemaker and history of breast cancer  RED FLAGS: None   WEIGHT BEARING RESTRICTIONS: No  FALLS:  Has patient fallen in last 6 months? No  LIVING ENVIRONMENT: Lives with: lives with their daughter Lives in: House/apartment Stairs:  stairs to basement apartment, rarely goes down, 4-5 steps to garage, has railing Has following equipment at home: Single point cane  OCCUPATION: retired  PLOF: Independent  PATIENT GOALS: be able to walk!  Wants to be able to walk on [place] at [facility] when visits other daughter, be motivated to exercise again  NEXT MD VISIT: 04/23/23 Dr. Denyse Amass for injection?  OBJECTIVE:  Note: Objective measures were completed at Evaluation unless otherwise noted.  DIAGNOSTIC FINDINGS: DG R Knee 02/20/2023 FINDINGS: No evidence of fracture, dislocation, or joint effusion. There is tricompartmental joint space narrowing, sclerosis and osteophytes consistent with degenerative joint disease. Moderate suprapatellar effusion is seen.  PATIENT  SURVEYS:  LEFS 41/80  COGNITION: Overall cognitive status: Within functional limits for tasks assessed     SENSATION: WFL  EDEMA:  Slight swelling in RLE compared to LLE, but not significantly different with circumferential measurement (20.5 cm bil)  MUSCLE LENGTH: NT  POSTURE: rounded shoulders and forward head  PALPATION: Tenderness R medial knee joint line, limited patellar mobility especially inf/sup direction, suprapatellar edema  LOWER EXTREMITY ROM:  Passive ROM Right eval Left eval  Knee flexion 125 126  Knee extension Lacking 5 0  Ankle dorsiflexion     (Blank rows = not tested)  LOWER EXTREMITY MMT:  MMT Right Eval -seated Left Eval -seated  Hip flexion 4+ 4+  Hip extension 4 4  Hip abduction 5 5  Hip adduction 4+ 4+  Hip internal rotation    Hip external rotation    Knee flexion 4+ 4+  Knee extension 5 5  Ankle dorsiflexion 5 5  Ankle plantarflexion     (Blank rows = not tested)  FUNCTIONAL TESTS:  5 times sit to stand: 16 seconds  without UE assist.  04/18/23- - 450', dyspnea on exertion, seated rest break needed, wearing donjoy brace on R  GAIT: Distance walked: 50' Assistive device utilized: None Level of assistance: Complete Independence Comments: visually slow, antalgic, lacking TKE on R   TODAY'S TREATMENT:                                                                                                                              DATE:   04/18/2023 Therapeutic Exercise: to improve strength and mobility.  Demo, verbal and tactile cues throughout for technique. Nustep L4 x 6 min  Gait x 450' ( , had to sit after 2 min and rest) At counter for safety with bil UE support: Heel/Toe Raises 2 x 10 Standing hip abduction x 10 r/l Standing hip extension x 10 r/l Mini-squats x 10 Hamstring curls x 10 r/l Tandem stance x 30 sec r/l  04/10/23 EVAL Self Care: Findings, POC  Discussion of different gyms and activities - senior  center, aquatic aerobics (not a fan), [employer])   PATIENT EDUCATION:  Education details: see self care Person educated: Patient Education method: Explanation Education comprehension: verbalized understanding  HOME EXERCISE PROGRAM: Access Code: [email or web address] URL: [email or web address] Date: 04/18/2023 Prepared by: Harrie Foreman  Exercises - Heel Toe Raises with Counter Support  - 1 x daily - 7 x weekly - 1-2 sets - 10 reps - Standing Hip Abduction with Counter Support  - 1 x daily - 7 x weekly - 1-2 sets - 10 reps - Standing Hip Extension with Counter Support  - 1 x daily - 7 x weekly - 1-2 sets - 10 reps - Mini Squat with Counter Support  - 1 x daily - 7 x weekly - 1-2 sets - 10 reps - Standing Single Leg Stance with Counter Support  - 1 x daily - 7 x weekly - 1-2 sets - 10 reps - Standing Tandem Balance with Counter Support  - 1 x daily - 7 x weekly - 1 sets - 3 reps - 30 sec hold  ASSESSMENT:  CLINICAL IMPRESSION: Terrionna Bridwell Mahnken participated in today, after PT assisted in donning Donjoy brace, ambulated 450' with 1 seated rest break.  Reviewed standing HEP for LE strengthening at counter for safety with good tolerance.  Assisted in removal of brace at end of session.  Celesta Funderburk Okelley continues to demonstrate potential for improvement and would benefit from continued skilled therapy to address impairments.     OBJECTIVE IMPAIRMENTS: Abnormal gait, decreased activity tolerance, decreased endurance, decreased mobility, difficulty walking, decreased ROM, decreased strength, hypomobility, increased edema, increased fascial restrictions, increased muscle spasms, impaired flexibility, and pain.   ACTIVITY LIMITATIONS: carrying, lifting, bending, standing, squatting, stairs, transfers, and locomotion level  PARTICIPATION LIMITATIONS: meal prep, cleaning, laundry, shopping, and community activity  PERSONAL FACTORS: Time since onset of  injury/illness/exacerbation and 3+ comorbidities: Afib, history of L breast cancer, L TKA 2012, osteopenia  are also affecting patient's  functional outcome.   REHAB POTENTIAL: Good  CLINICAL DECISION MAKING: Stable/uncomplicated  EVALUATION COMPLEXITY: Low   GOALS: Goals reviewed with patient? Yes  SHORT TERM GOALS: Target date: 04/24/2023   Patient will be independent with initial HEP. Baseline:  Goal status: IN PROGRESS  LONG TERM GOALS: Target date: 06/05/2023   Patient will be independent with advanced/ongoing HEP to improve outcomes and carryover.  Baseline:  Goal status: IN PROGRESS  2.  Patient will report at least 50% improvement in R knee pain to improve QOL. Baseline:  Goal status: IN PROGRESS  3.  Patient will demonstrate improved R knee extension < 5 deg to allow for normal gait and stair mechanics. Baseline: lacking 5 deg Goal status: IN PROGRESS  4.  Patient will demonstrate improved functional LE strength as demonstrated by 5 x STS <14 sec. Baseline: 16 sec  Goal status: IN PROGRESS  5.  Patient will report 9 points improvement on LEFS to demonstrate improved functional ability. Baseline: 41/80 Goal status: IN PROGRESS  6.  Patient will express confidence with transition to community based exercise program. Baseline:  Goal status: IN PROGRESS   7.  Patient will be able to walk for 10 min to start walking program.  Baseline: unable due to pain Goal status: IN PROGRESS 04/18/23- needed seated break after 2 min.   PLAN:  PT FREQUENCY: 1-2x/week  PT DURATION: 8 weeks  PLANNED INTERVENTIONS: 97110-Therapeutic exercises, 97530- Therapeutic activity, [id number]- Neuromuscular re-education, 97535- Self Care, [id number]- Manual therapy, [id number]- Gait training, [phone]- Orthotic Fit/training, 97014- Electrical stimulation (unattended), [id number]- Electrical stimulation (manual), [id number]- Ultrasound, [id number]- Ionotophoresis 4mg /ml Dexamethasone, Balance training, Stair training,  Taping, Dry Needling, Joint mobilization, Joint manipulation, Cryotherapy, and Moist heat  PLAN FOR NEXT SESSION: , review HEP (had one prior), examine brace, manual therapy.  Has PACEMAKER.    Jena Gauss, PT, DPT 04/18/2023, 5:10 PM   Date of referral: 03/20/2023 Referring provider: Judi Saa, DO Referring diagnosis? M17.11 (ICD-10-CM) - Primary osteoarthritis of right knee  Treatment diagnosis? (if different than referring diagnosis) M25.561 Chronic R knee pain  What was this (referring dx) caused by? Arthritis  Nature of Condition: Chronic (continuous duration > 3 months)   Laterality: Rt  Current Functional Measure Score: LEFS 41/80  Objective measurements identify impairments when they are compared to normal values, the uninvolved extremity, and prior level of function.  [x]  Yes  []  No  Objective assessment of functional ability: Moderate functional limitations   Briefly describe symptoms: sharp shooting R knee pain with walking  How did symptoms start: chronic, worsened last year, treatment put on hiatus due to return of breast cancer and treatment  Average pain intensity:  Last 24 hours: 7/10  Past week: 7/10  How often does the pt experience symptoms? Frequently  How much have the symptoms interfered with usual daily activities? Quite a bit  How has condition changed since care began at this facility? NA - initial visit  In general, how is the patients overall health? Fair   BACK PAIN (STarT Back Screening Tool) No

## 2023-04-22 ENCOUNTER — Ambulatory Visit: Payer: Medicare Other

## 2023-04-22 DIAGNOSIS — M6281 Muscle weakness (generalized): Secondary | ICD-10-CM

## 2023-04-22 DIAGNOSIS — R2681 Unsteadiness on feet: Secondary | ICD-10-CM

## 2023-04-22 DIAGNOSIS — R262 Difficulty in walking, not elsewhere classified: Secondary | ICD-10-CM

## 2023-04-22 DIAGNOSIS — M25561 Pain in right knee: Secondary | ICD-10-CM | POA: Diagnosis not present

## 2023-04-22 DIAGNOSIS — G8929 Other chronic pain: Secondary | ICD-10-CM

## 2023-04-22 NOTE — Therapy (Signed)
OUTPATIENT PHYSICAL THERAPY LOWER EXTREMITY TREATMENT   Patient Name: Sheila Schmidt MRN: [id number] DOB:1936/04/19, 87 y.o., female Today's Date: 04/22/2023  END OF SESSION:  PT End of Session - 04/22/23 1153     Visit Number 3    Date for PT Re-Evaluation 06/05/23    Authorization Type [employer] Medicare    Progress Note Due on Visit 10    PT Start Time 1103    PT Stop Time 1145    PT Time Calculation (min) 42 min    Activity Tolerance Patient tolerated treatment well    Behavior During Therapy WFL for tasks assessed/performed              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   Dysrhythmia    Afib   Heart murmur    Hx of adenomatous colonic polyps    Hyperlipidemia    Presence of permanent cardiac pacemaker    Past Surgical History:  Procedure Laterality Date   ATRIAL FLUTTER ABLATION     BIV PACEMAKER INSERTION CRT-P N/A 12/04/2021   Procedure: BIV PACEMAKER INSERTION CRT-P;  Surgeon: Lanier Prude, MD;  Location: [facility];  Service: Cardiovascular;  Laterality: N/A;   BREAST BIOPSY Left 07/30/2022   [place] LT BREAST BX W LOC DEV 1ST LESION IMG BX SPEC US GUIDE 07/30/2022 [facility]   BREAST BIOPSY Left 09/10/2022   [place] LT RADIOACTIVE SEED LOC 09/10/2022 [facility]   BREAST BIOPSY Left 09/10/2022   [place] LT RADIOACTIVE SEED LOC 09/10/2022 [facility]   BREAST EXCISIONAL BIOPSY Left 2010   benign   BREAST LUMPECTOMY WITH RADIOACTIVE SEED AND SENTINEL LYMPH NODE BIOPSY Left 09/11/2022   Procedure: LEFT BREAST LUMPECTOMY WITH RADIOACTIVE SEED;  Surgeon: Harriette Bouillon, MD;  Location: [facility] OR;  Service: General;  Laterality: Left;   CARDIOVERSION  05/2020   in [facility]   CARDIOVERSION N/A 06/21/2021   Procedure: CARDIOVERSION;  Surgeon: Sande Rives, MD;  Location: [facility] ENDOSCOPY;  Service: Cardiovascular;   Laterality: N/A;   CARDIOVERSION N/A 03/26/2022   Procedure: CARDIOVERSION;  Surgeon: Lewayne Bunting, MD;  Location: [facility] ENDOSCOPY;  Service: Cardiovascular;  Laterality: N/A;   CHOLECYSTECTOMY     CHOLECYSTECTOMY, LAPAROSCOPIC  '06   [name]   KNEE ARTHROSCOPY     Left '00 [place]/ Right '08   lumpectomy- remote     benign   RADIOACTIVE SEED GUIDED AXILLARY SENTINEL LYMPH NODE Left 09/11/2022   Procedure: RADIOACTIVE SEED GUIDED LEFT SENTINEL LYMPH NODE BIOPSY;  Surgeon: Harriette Bouillon, MD;  Location: [facility] OR;  Service: General;  Laterality: Left;   TOOTH EXTRACTION     TOTAL KNEE ARTHROPLASTY  02/05/2011   left   Patient Active Problem List   Diagnosis Date Noted   Hypertensive disorder 01/15/2023   Dyslipidemia 01/15/2023   Genetic testing 09/18/2022   Pacemaker [employer] device BiV 09/06/2022   Malignant neoplasm of upper-outer quadrant of left breast in female, estrogen receptor positive (HCC) 08/06/2022   Pure hypercholesterolemia 05/15/2022   Skin cancer 03/07/2022   Closed nondisplaced fracture of distal phalanx of right thumb 02/15/2022   Atrial fibrillation (HCC) 12/04/2021   Cancer (HCC) 05/25/2021   Left bundle branch block 04/12/2021   Dilated cardiomyopathy (HCC) 04/12/2021   Vasculitis (HCC) 02/22/2021   Anxiety state 02/12/2021  Body fluid retention 02/12/2021   Cervical intraepithelial neoplasia 02/12/2021   Insomnia 02/12/2021   Pruritus of vulva 02/12/2021   Leg wound, right 10/03/2020   Bilateral primary osteoarthritis of knee 03/30/2019   Paroxysmal atrial fibrillation (HCC) 04/07/2015   Chronic anticoagulation 04/07/2015   Paroxysmal atrial flutter (HCC) 03/16/2015   Essential hypertension, benign 02/24/2014   Sciatica 12/08/2013   Hyperlipidemia 09/25/2013   Osteopenia 11/19/2012   Low back pain 01/26/2010   Carcinoma in situ of breast 09/04/2009   DEPRESSION, PROLONGED 09/04/2009   OA (osteoarthritis) of knee 09/04/2009   PERIPHERAL EDEMA 09/04/2009    History of colonic polyps 09/04/2009    PCP: Pearline Cables, MD   REFERRING PROVIDER: Judi Saa, DO  REFERRING DIAG: M17.0 (ICD-10-CM) - Bilateral primary osteoarthritis of knee   THERAPY DIAG:  Chronic pain of right knee  Muscle weakness (generalized)  Difficulty in walking, not elsewhere classified  Unsteadiness on feet  Rationale for Evaluation and Treatment: Rehabilitation  ONSET DATE: chronic, worsened Sept 2023  SUBJECTIVE:   SUBJECTIVE STATEMENT:   Sheila Schmidt reports mild pain.     PERTINENT HISTORY: Afib, history of L breast cancer, L TKA 2012, osteopenia PAIN:  Are you having pain? Yes: NPRS scale: 3/10 Pain location: R knee  Pain description: sharp, shooting Aggravating factors: turning, twisting Relieving factors: putting feet up, doing floor bike, walking with hands on shopping cart  PRECAUTIONS: ICD/Pacemaker and history of breast cancer  RED FLAGS: None   WEIGHT BEARING RESTRICTIONS: No  FALLS:  Has patient fallen in last 6 months? No  LIVING ENVIRONMENT: Lives with: lives with their daughter Lives in: House/apartment Stairs:  stairs to basement apartment, rarely goes down, 4-5 steps to garage, has railing Has following equipment at home: Single point cane  OCCUPATION: retired  PLOF: Independent  PATIENT GOALS: be able to walk!  Wants to be able to walk on [place] at [facility] when visits other daughter, be motivated to exercise again  NEXT MD VISIT: 04/23/23 Dr. Denyse Amass for injection?  OBJECTIVE:  Note: Objective measures were completed at Evaluation unless otherwise noted.  DIAGNOSTIC FINDINGS: DG R Knee 02/20/2023 FINDINGS: No evidence of fracture, dislocation, or joint effusion. There is tricompartmental joint space narrowing, sclerosis and osteophytes consistent with degenerative joint disease. Moderate suprapatellar effusion is seen.  PATIENT SURVEYS:  LEFS 41/80  COGNITION: Overall cognitive status: Within  functional limits for tasks assessed     SENSATION: WFL  EDEMA:  Slight swelling in RLE compared to LLE, but not significantly different with circumferential measurement (20.5 cm bil)  MUSCLE LENGTH: NT  POSTURE: rounded shoulders and forward head  PALPATION: Tenderness R medial knee joint line, limited patellar mobility especially inf/sup direction, suprapatellar edema  LOWER EXTREMITY ROM:  Passive ROM Right eval Left eval  Knee flexion 125 126  Knee extension Lacking 5 0  Ankle dorsiflexion     (Blank rows = not tested)  LOWER EXTREMITY MMT:  MMT Right Eval -seated Left Eval -seated  Hip flexion 4+ 4+  Hip extension 4 4  Hip abduction 5 5  Hip adduction 4+ 4+  Hip internal rotation    Hip external rotation    Knee flexion 4+ 4+  Knee extension 5 5  Ankle dorsiflexion 5 5  Ankle plantarflexion     (Blank rows = not tested)  FUNCTIONAL TESTS:  5 times sit to stand: 16 seconds  without UE assist.  04/18/23- - 450', dyspnea on exertion, seated rest break  needed, wearing donjoy brace on R  GAIT: Distance walked: 3' Assistive device utilized: None Level of assistance: Complete Independence Comments: visually slow, antalgic, lacking TKE on R   TODAY'S TREATMENT:                                                                                                                              DATE:  04/22/23 Therapeutic Exercise: to improve strength and mobility.  Demo, verbal and tactile cues throughout for technique. Nustep L4 x 6 min Heel/Toe Raises 2 x 10; B con/unilat ecc heel raise x 5 bil- difficult d/t cognition Standing hip abduction 2 x 10 bil Standing hip extension 2 x 10 bil Mini-squats 2 x 10 Seated R/L LAQ x 10  Seated R/L HS curls YTB x 10   Manual Therapy: to decrease muscle spasm, pain and improve mobility.  Grade I R tibia on femur mobs PA and AP in sitting for pain R knee gentle PROM  04/18/2023 Therapeutic Exercise: to improve  strength and mobility.  Demo, verbal and tactile cues throughout for technique. Nustep L4 x 6 min  Gait x 450' ( , had to sit after 2 min and rest) At counter for safety with bil UE support: Heel/Toe Raises 2 x 10 Standing hip abduction x 10 r/l Standing hip extension x 10 r/l Mini-squats x 10 Hamstring curls x 10 r/l Tandem stance x 30 sec r/l  04/10/23 EVAL Self Care: Findings, POC  Discussion of different gyms and activities - senior center, aquatic aerobics (not a fan), [employer])   PATIENT EDUCATION:  Education details: see self care Person educated: Patient Education method: Explanation Education comprehension: verbalized understanding  HOME EXERCISE PROGRAM: Access Code: [email or web address] URL: [email or web address] Date: 04/18/2023 Prepared by: Harrie Foreman  Exercises - Heel Toe Raises with Counter Support  - 1 x daily - 7 x weekly - 1-2 sets - 10 reps - Standing Hip Abduction with Counter Support  - 1 x daily - 7 x weekly - 1-2 sets - 10 reps - Standing Hip Extension with Counter Support  - 1 x daily - 7 x weekly - 1-2 sets - 10 reps - Mini Squat with Counter Support  - 1 x daily - 7 x weekly - 1-2 sets - 10 reps - Standing Single Leg Stance with Counter Support  - 1 x daily - 7 x weekly - 1-2 sets - 10 reps - Standing Tandem Balance with Counter Support  - 1 x daily - 7 x weekly - 1 sets - 3 reps - 30 sec hold  ASSESSMENT:  CLINICAL IMPRESSION: Sheila Schmidt was able to complete all the interventions today. Needs cues to hinge hips with squats. Additional cues provided as needed with exercises. Pt noted pain relief and improvement after MT. She is seeing Dr. Denyse Amass tomorrow for possible injection for R knee. Sheila Schmidt continues to demonstrate potential for improvement and would benefit from continued skilled therapy  to address impairments.     OBJECTIVE IMPAIRMENTS: Abnormal gait, decreased activity tolerance, decreased  endurance, decreased mobility, difficulty walking, decreased ROM, decreased strength, hypomobility, increased edema, increased fascial restrictions, increased muscle spasms, impaired flexibility, and pain.   ACTIVITY LIMITATIONS: carrying, lifting, bending, standing, squatting, stairs, transfers, and locomotion level  PARTICIPATION LIMITATIONS: meal prep, cleaning, laundry, shopping, and community activity  PERSONAL FACTORS: Time since onset of injury/illness/exacerbation and 3+ comorbidities: Afib, history of L breast cancer, L TKA 2012, osteopenia  are also affecting patient's functional outcome.   REHAB POTENTIAL: Good  CLINICAL DECISION MAKING: Stable/uncomplicated  EVALUATION COMPLEXITY: Low   GOALS: Goals reviewed with patient? Yes  SHORT TERM GOALS: Target date: 04/24/2023   Patient will be independent with initial HEP. Baseline:  Goal status: MET- 04/22/23  LONG TERM GOALS: Target date: 06/05/2023   Patient will be independent with advanced/ongoing HEP to improve outcomes and carryover.  Baseline:  Goal status: IN PROGRESS  2.  Patient will report at least 50% improvement in R knee pain to improve QOL. Baseline:  Goal status: IN PROGRESS  3.  Patient will demonstrate improved R knee extension < 5 deg to allow for normal gait and stair mechanics. Baseline: lacking 5 deg Goal status: IN PROGRESS  4.  Patient will demonstrate improved functional LE strength as demonstrated by 5 x STS <14 sec. Baseline: 16 sec  Goal status: IN PROGRESS  5.  Patient will report 9 points improvement on LEFS to demonstrate improved functional ability. Baseline: 41/80 Goal status: IN PROGRESS  6.  Patient will express confidence with transition to community based exercise program. Baseline:  Goal status: IN PROGRESS   7.  Patient will be able to walk for 10 min to start walking program.  Baseline: unable due to pain Goal status: IN PROGRESS 04/18/23- needed seated break after 2 min.    PLAN:  PT FREQUENCY: 1-2x/week  PT DURATION: 8 weeks  PLANNED INTERVENTIONS: 97110-Therapeutic exercises, 97530- Therapeutic activity, [id number]- Neuromuscular re-education, 97535- Self Care, [id number]- Manual therapy, [id number]- Gait training, [phone]- Orthotic Fit/training, 97014- Electrical stimulation (unattended), [id number]- Electrical stimulation (manual), [id number]- Ultrasound, [id number]- Ionotophoresis 4mg /ml Dexamethasone, Balance training, Stair training, Taping, Dry Needling, Joint mobilization, Joint manipulation, Cryotherapy, and Moist heat  PLAN FOR NEXT SESSION:  review HEP (had one prior), examine brace, manual therapy.  Has PACEMAKER.    Darleene Cleaver, PTA 04/22/2023, 11:59 AM   Date of referral: 03/20/2023 Referring provider: Judi Saa, DO Referring diagnosis? M17.11 (ICD-10-CM) - Primary osteoarthritis of right knee  Treatment diagnosis? (if different than referring diagnosis) M25.561 Chronic R knee pain  What was this (referring dx) caused by? Arthritis  Nature of Condition: Chronic (continuous duration > 3 months)   Laterality: Rt  Current Functional Measure Score: LEFS 41/80  Objective measurements identify impairments when they are compared to normal values, the uninvolved extremity, and prior level of function.  [x]  Yes  []  No  Objective assessment of functional ability: Moderate functional limitations   Briefly describe symptoms: sharp shooting R knee pain with walking  How did symptoms start: chronic, worsened last year, treatment put on hiatus due to return of breast cancer and treatment  Average pain intensity:  Last 24 hours: 7/10  Past week: 7/10  How often does the pt experience symptoms? Frequently  How much have the symptoms interfered with usual daily activities? Quite a bit  How has condition changed since care began at this facility? NA - initial visit  In general, how is the  patients overall health? Fair   BACK PAIN (STarT Back Screening  Tool) No

## 2023-04-23 ENCOUNTER — Ambulatory Visit (INDEPENDENT_AMBULATORY_CARE_PROVIDER_SITE_OTHER): Payer: Medicare Other | Admitting: Family Medicine

## 2023-04-23 ENCOUNTER — Other Ambulatory Visit: Payer: Self-pay

## 2023-04-23 ENCOUNTER — Encounter: Payer: Self-pay | Admitting: Family Medicine

## 2023-04-23 VITALS — BP 150/76 | HR 60 | Ht 62.0 in | Wt 182.0 lb

## 2023-04-23 DIAGNOSIS — G8929 Other chronic pain: Secondary | ICD-10-CM

## 2023-04-23 DIAGNOSIS — M1711 Unilateral primary osteoarthritis, right knee: Secondary | ICD-10-CM

## 2023-04-23 DIAGNOSIS — M25561 Pain in right knee: Secondary | ICD-10-CM

## 2023-04-23 MED ORDER — SODIUM HYALURONATE 60 MG/3ML IX PRSY
60.0000 mg | PREFILLED_SYRINGE | Freq: Once | INTRA_ARTICULAR | Status: AC
  Administered 2023-04-23: 60 mg via INTRA_ARTICULAR

## 2023-04-23 NOTE — Patient Instructions (Addendum)
Thank you for coming in today.   You received an injection today. Seek immediate medical attention if the joint becomes red, extremely painful, or is oozing fluid.   Check back as needed 

## 2023-04-23 NOTE — Progress Notes (Signed)
I, Stevenson Clinch, CMA acting as a scribe for Clementeen Graham, MD.  Sheila Schmidt is a 87 y.o. female who presents to [facility] today for cont'd R knee pain. Pt was last seen by Dr. Katrinka Blazing on 02/20/23 and was given a R knee steroid injection. She is currently doing PT at the [facility] location, completing 3 visits.  Today, pt reports continued right knee pain. Notes blister at lateral aspect of the right knee. Has been wearing custom knee brace. Denies visible swelling. Locates pain to medial aspect of the knee. Sx worse after more activity. Has been using stationary cycling pedals which has helped with LE swelling.  She had a Zilretta injection with Dr. Jordan Likes last year and thinks it only worked for a week or 2.  She has a history of a left total knee replacement and is not looking forward to having a right total knee replacement if she can avoid it.  Dx imaging: 02/20/23 R knee XR  Pertinent review of systems: No fevers or chills  Relevant historical information: Heart disease with dilated cardiomyopathy and a pacemaker.  History of breast cancer.  Social determinants of health: Relies on family for transportation.  Exam:  BP (!) 150/76   Pulse 60   Ht 5\' 2"  (1.575 m)   Wt 182 lb (82.6 kg)   SpO2 97%   BMI 33.29 kg/m  General: Well Developed, well nourished, and in no acute distress.   MSK: Right knee mild edema and mild effusion.  She has a blister with a little surrounding skin erythema on the lateral superior knee.  The anterior knee skin is normal-appearing Normal motion with mild crepitation.    Lab and Radiology Results  Sheila Schmidt presents to clinic today for Durolane injection right knee 1/1 Procedure: Real-time Ultrasound Guided Injection of right knee joint anterior medial approach Device: Philips Affiniti 50G/GE Logiq Images permanently stored and available for review in PACS Verbal informed consent obtained.  Discussed risks  and benefits of procedure. Warned about infection, bleeding, damage to structures among others. Patient expresses understanding and agreement Time-out conducted.   Noted no overlying erythema, induration, or other signs of local infection.   Skin prepped in a sterile fashion.   Local anesthesia: Topical Ethyl chloride.   With sterile technique and under real time ultrasound guidance: Durolane 60 mg injected into knee joint. Fluid seen entering the joint capsule.   Completed without difficulty   Advised to call if fevers/chills, erythema, induration, drainage, or persistent bleeding.   Images permanently stored and available for review in the ultrasound unit.  Impression: Technically successful ultrasound guided injection. Lot number: [id number]  EXAM: RIGHT KNEE 3 VIEWS   COMPARISON:  05/30/2021   FINDINGS: No evidence of fracture, dislocation, or joint effusion. There is tricompartmental joint space narrowing, sclerosis and osteophytes consistent with degenerative joint disease. Moderate suprapatellar effusion is seen.   IMPRESSION: Degenerative changes. Effusion.  No acute osseous abnormalities     Electronically Signed   By: Layla Maw M.D.   On: 03/03/2023 23:30 I, Clementeen Graham, personally (independently) visualized and performed the interpretation of the images attached in this note.     Assessment and Plan: 87 y.o. female with right knee pain due to DJD.  Plan for Durolane injection today.  She had a Zilretta injection a year ago and thinks it only worked for a week or 2.  I do not think repeating Zilretta injection or steroid injections  will be very beneficial.  If this shot today does not work she could have surgical consultation for total knee replacement.  However her health is generally poor and I do not think she is a very good surgical candidate.  She is a much better candidate for genicular artery embolization which we talked about.  Happy to place a referral to  interventional radiology to discuss genicular artery embolization   PDMP not reviewed this encounter. Orders Placed This Encounter  Procedures   [place] LIMITED JOINT SPACE STRUCTURES LOW RIGHT(NO LINKED CHARGES)    Order Specific Question:   Reason for Exam (SYMPTOM  OR DIAGNOSIS REQUIRED)    Answer:   right knee pain    Order Specific Question:   Preferred imaging location?    Answer:   Adult nurse [facility] ordered this encounter  Medications   Sodium Hyaluronate (DUROLANE) intra-articular injection 60 mg     Discussed warning signs or symptoms. Please see discharge instructions. Patient expresses understanding.   The above documentation has been reviewed and is accurate and complete Clementeen Graham, M.D.

## 2023-04-24 ENCOUNTER — Ambulatory Visit: Payer: Medicare Other | Admitting: Physical Therapy

## 2023-04-24 ENCOUNTER — Encounter: Payer: Self-pay | Admitting: Family Medicine

## 2023-04-24 ENCOUNTER — Encounter: Payer: Self-pay | Admitting: Physical Therapy

## 2023-04-24 DIAGNOSIS — R2681 Unsteadiness on feet: Secondary | ICD-10-CM

## 2023-04-24 DIAGNOSIS — M25561 Pain in right knee: Secondary | ICD-10-CM | POA: Diagnosis not present

## 2023-04-24 DIAGNOSIS — G8929 Other chronic pain: Secondary | ICD-10-CM

## 2023-04-24 DIAGNOSIS — R262 Difficulty in walking, not elsewhere classified: Secondary | ICD-10-CM

## 2023-04-24 DIAGNOSIS — M6281 Muscle weakness (generalized): Secondary | ICD-10-CM

## 2023-04-24 NOTE — Therapy (Signed)
OUTPATIENT PHYSICAL THERAPY LOWER EXTREMITY TREATMENT   Patient Name: Sheila Schmidt MRN: [id number] DOB:[birth year], 87 y.o., female Today's Date: 04/24/2023  END OF SESSION:  PT End of Session - 04/24/23 1155     Visit Number 4    Date for PT Re-Evaluation 06/05/23    Authorization Type [employer] Medicare    Progress Note Due on Visit 10    PT Start Time 1150    PT Stop Time 1233    PT Time Calculation (min) 43 min    Activity Tolerance Patient tolerated treatment well    Behavior During Therapy WFL for tasks assessed/performed              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   Dysrhythmia    Afib   Heart murmur    Hx of adenomatous colonic polyps    Hyperlipidemia    Presence of permanent cardiac pacemaker    Past Surgical History:  Procedure Laterality Date   ATRIAL FLUTTER ABLATION     BIV PACEMAKER INSERTION CRT-P N/A 12/04/2021   Procedure: BIV PACEMAKER INSERTION CRT-P;  Surgeon: Lanier Prude, MD;  [facility]: [facility];  Service: Cardiovascular;  Laterality: N/A;   BREAST BIOPSY Left 07/30/2022   [place] LT BREAST BX W LOC DEV 1ST LESION IMG BX SPEC US GUIDE 07/30/2022 [facility]   BREAST BIOPSY Left 09/10/2022   [place] LT RADIOACTIVE SEED LOC 09/10/2022 [facility]   BREAST BIOPSY Left 09/10/2022   [place] LT RADIOACTIVE SEED LOC 09/10/2022 [facility]   BREAST EXCISIONAL BIOPSY Left 2010   benign   BREAST LUMPECTOMY WITH RADIOACTIVE SEED AND SENTINEL LYMPH NODE BIOPSY Left 09/11/2022   Procedure: LEFT BREAST LUMPECTOMY WITH RADIOACTIVE SEED;  Surgeon: Harriette Bouillon, MD;  Location: [facility];  Service: General;  Laterality: Left;   CARDIOVERSION  05/2020   in [facility]   CARDIOVERSION N/A 06/21/2021   Procedure: CARDIOVERSION;  Surgeon: Sande Rives, MD;  Location: [facility] ENDOSCOPY;  Service: Cardiovascular;   Laterality: N/A;   CARDIOVERSION N/A 03/26/2022   Procedure: CARDIOVERSION;  Surgeon: Lewayne Bunting, MD;  Location: [facility] ENDOSCOPY;  Service: Cardiovascular;  Laterality: N/A;   CHOLECYSTECTOMY     CHOLECYSTECTOMY, LAPAROSCOPIC  '06   [name]   KNEE ARTHROSCOPY     Left '00 Rendall/ Right '08   lumpectomy- remote     benign   RADIOACTIVE SEED GUIDED AXILLARY SENTINEL LYMPH NODE Left 09/11/2022   Procedure: RADIOACTIVE SEED GUIDED LEFT SENTINEL LYMPH NODE BIOPSY;  Surgeon: Harriette Bouillon, MD;  Location: [facility] OR;  Service: General;  Laterality: Left;   TOOTH EXTRACTION     TOTAL KNEE ARTHROPLASTY  02/05/2011   left   Patient Active Problem List   Diagnosis Date Noted   Hypertensive disorder 01/15/2023   Dyslipidemia 01/15/2023   Genetic testing 09/18/2022   Pacemaker Abbott device BiV 09/06/2022   Malignant neoplasm of upper-outer quadrant of left breast in female, estrogen receptor positive (HCC) 08/06/2022   Pure hypercholesterolemia 05/15/2022   Skin cancer 03/07/2022   Closed nondisplaced fracture of distal phalanx of right thumb 02/15/2022   Atrial fibrillation (HCC) 12/04/2021   Cancer (HCC) 05/25/2021   Left bundle branch block 04/12/2021   Dilated cardiomyopathy (HCC) 04/12/2021   Vasculitis (HCC) 02/22/2021   Anxiety state 02/12/2021  Body fluid retention 02/12/2021   Cervical intraepithelial neoplasia 02/12/2021   Insomnia 02/12/2021   Pruritus of vulva 02/12/2021   Leg wound, right 10/03/2020   Bilateral primary osteoarthritis of knee 03/30/2019   Paroxysmal atrial fibrillation (HCC) 04/07/2015   Chronic anticoagulation 04/07/2015   Paroxysmal atrial flutter (HCC) 03/16/2015   Essential hypertension, benign 02/24/2014   Sciatica 12/08/2013   Hyperlipidemia 09/25/2013   Osteopenia 11/19/2012   Low back pain 01/26/2010   Carcinoma in situ of breast 09/04/2009   DEPRESSION, PROLONGED 09/04/2009   OA (osteoarthritis) of knee 09/04/2009   PERIPHERAL EDEMA 09/04/2009    History of colonic polyps 09/04/2009    PCP: Pearline Cables, MD   REFERRING PROVIDER: Judi Saa, DO  REFERRING DIAG: M17.0 (ICD-10-CM) - Bilateral primary osteoarthritis of knee   THERAPY DIAG:  Chronic pain of right knee  Muscle weakness (generalized)  Difficulty in walking, not elsewhere classified  Unsteadiness on feet  Rationale for Evaluation and Treatment: Rehabilitation  ONSET DATE: chronic, worsened Sept 2023  SUBJECTIVE:   SUBJECTIVE STATEMENT:   Sheila Schmidt got a shot in her knee yesterday.       PERTINENT HISTORY: Afib, history of L breast cancer, L TKA 2012, osteopenia PAIN:  Are you having pain? Yes: NPRS scale: 3/10 Pain location: R knee  Pain description: sharp, shooting Aggravating factors: turning, twisting Relieving factors: putting feet up, doing floor bike, walking with hands on shopping cart  PRECAUTIONS: ICD/Pacemaker and history of breast cancer  RED FLAGS: None   WEIGHT BEARING RESTRICTIONS: No  FALLS:  Has patient fallen in last 6 months? No  LIVING ENVIRONMENT: Lives with: lives with their daughter Lives in: House/apartment Stairs:  stairs to basement apartment, rarely goes down, 4-5 steps to garage, has railing Has following equipment at home: Single point cane  OCCUPATION: retired  PLOF: Independent  PATIENT GOALS: be able to walk!  Wants to be able to walk on [place] at [facility] when visits other daughter, be motivated to exercise again  NEXT MD VISIT: 04/23/23 Dr. Denyse Amass for injection?  OBJECTIVE:  Note: Objective measures were completed at Evaluation unless otherwise noted.  DIAGNOSTIC FINDINGS: DG R Knee 02/20/2023 FINDINGS: No evidence of fracture, dislocation, or joint effusion. There is tricompartmental joint space narrowing, sclerosis and osteophytes consistent with degenerative joint disease. Moderate suprapatellar effusion is seen.  PATIENT SURVEYS:  LEFS 41/80  COGNITION: Overall cognitive  status: Within functional limits for tasks assessed     SENSATION: WFL  EDEMA:  Slight swelling in RLE compared to LLE, but not significantly different with circumferential measurement (20.5 cm bil)  MUSCLE LENGTH: NT  POSTURE: rounded shoulders and forward head  PALPATION: Tenderness R medial knee joint line, limited patellar mobility especially inf/sup direction, suprapatellar edema  LOWER EXTREMITY ROM:  Passive ROM Right eval Left eval  Knee flexion 125 126  Knee extension Lacking 5 0  Ankle dorsiflexion     (Blank rows = not tested)  LOWER EXTREMITY MMT:  MMT Right Eval -seated Left Eval -seated  Hip flexion 4+ 4+  Hip extension 4 4  Hip abduction 5 5  Hip adduction 4+ 4+  Hip internal rotation    Hip external rotation    Knee flexion 4+ 4+  Knee extension 5 5  Ankle dorsiflexion 5 5  Ankle plantarflexion     (Blank rows = not tested)  FUNCTIONAL TESTS:  5 times sit to stand: 16 seconds  without UE assist.  04/18/23- - 450',  dyspnea on exertion, seated rest break needed, wearing donjoy brace on R  GAIT: Distance walked: 50' Assistive device utilized: None Level of assistance: Complete Independence Comments: visually slow, antalgic, lacking TKE on R   TODAY'S TREATMENT:                                                                                                                              DATE:   04/24/23 Therapeutic Exercise: to improve strength and mobility.  Demo, verbal and tactile cues throughout for technique. Bike L1 x 6 min  Seated R/L LAQ for isometric hold x 10 with 10 sec hold 2# Seated step overs for hip flecor strengthening x 15 R/L, 2# Standing Hamstring curls x 25 each 2# Standing hip extension 2 x 10 bil  Retro walking x 5 at counter for safety  Church pews x 10 Seated marching HEP update   04/22/23 Therapeutic Exercise: to improve strength and mobility.  Demo, verbal and tactile cues throughout for technique. Nustep  L4 x 6 min Heel/Toe Raises 2 x 10; B con/unilat ecc heel raise x 5 bil- difficult d/t cognition Standing hip abduction 2 x 10 bil Standing hip extension 2 x 10 bil Mini-squats 2 x 10 Seated R/L LAQ x 10  Seated R/L HS curls YTB x 10   Manual Therapy: to decrease muscle spasm, pain and improve mobility.  Grade I R tibia on femur mobs PA and AP in sitting for pain R knee gentle PROM  04/18/2023 Therapeutic Exercise: to improve strength and mobility.  Demo, verbal and tactile cues throughout for technique. Nustep L4 x 6 min  Gait x 450' ( , had to sit after 2 min and rest) At counter for safety with bil UE support: Heel/Toe Raises 2 x 10 Standing hip abduction x 10 r/l Standing hip extension x 10 r/l Mini-squats x 10 Hamstring curls x 10 r/l Tandem stance x 30 sec r/l  04/10/23 EVAL Self Care: Findings, POC  Discussion of different gyms and activities - senior center, aquatic aerobics (not a fan), [employer])   PATIENT EDUCATION:  Education details: see self care Person educated: Patient Education method: Explanation Education comprehension: verbalized understanding  HOME EXERCISE PROGRAM: Access Code: [email or web address] URL: [email or web address] Date: 04/24/2023 Prepared by: Harrie Foreman  Exercises - Heel Toe Raises with Counter Support  - 1 x daily - 7 x weekly - 1-2 sets - 10 reps - Standing Hip Abduction with Counter Support  - 1 x daily - 7 x weekly - 1-2 sets - 10 reps - Standing Hip Extension with Counter Support  - 1 x daily - 7 x weekly - 1-2 sets - 10 reps - Mini Squat with Counter Support  - 1 x daily - 7 x weekly - 1-2 sets - 10 reps - Standing Single Leg Stance with Counter Support  - 1 x daily - 7 x weekly - 1-2 sets - 10 reps - Standing Tandem Balance  with Counter Support  - 1 x daily - 7 x weekly - 1 sets - 3 reps - 30 sec hold - Church Pew  - 1 x daily - 7 x weekly - 3 sets - 10 reps - Seated March  - 1 x daily - 7 x weekly -  3 sets - 10 reps - Seated March with Same Side Arm Raise   - 1 x daily - 7 x weekly - 3 sets - 10 reps  ASSESSMENT:  CLINICAL IMPRESSION: Sheila Schmidt had injection in knee yesterday.  Today had good tolerance with exercises, reporting decreased pain following.  She did have a blister on lateral side of R knee, recommended discontinuing use of brace until resolved and seeing MD if worsens.  Sheila Schmidt continues to demonstrate potential for improvement and would benefit from continued skilled therapy to address impairments.     OBJECTIVE IMPAIRMENTS: Abnormal gait, decreased activity tolerance, decreased endurance, decreased mobility, difficulty walking, decreased ROM, decreased strength, hypomobility, increased edema, increased fascial restrictions, increased muscle spasms, impaired flexibility, and pain.   ACTIVITY LIMITATIONS: carrying, lifting, bending, standing, squatting, stairs, transfers, and locomotion level  PARTICIPATION LIMITATIONS: meal prep, cleaning, laundry, shopping, and community activity  PERSONAL FACTORS: Time since onset of injury/illness/exacerbation and 3+ comorbidities: Afib, history of L breast cancer, L TKA 2012, osteopenia  are also affecting patient's functional outcome.   REHAB POTENTIAL: Good  CLINICAL DECISION MAKING: Stable/uncomplicated  EVALUATION COMPLEXITY: Low   GOALS: Goals reviewed with patient? Yes  SHORT TERM GOALS: Target date: 04/24/2023   Patient will be independent with initial HEP. Baseline:  Goal status: MET- 04/22/23  LONG TERM GOALS: Target date: 06/05/2023   Patient will be independent with advanced/ongoing HEP to improve outcomes and carryover.  Baseline:  Goal status: IN PROGRESS  2.  Patient will report at least 50% improvement in R knee pain to improve QOL. Baseline:  Goal status: IN PROGRESS  3.  Patient will demonstrate improved R knee extension < 5 deg to allow for normal gait and stair mechanics. Baseline:  lacking 5 deg Goal status: IN PROGRESS  4.  Patient will demonstrate improved functional LE strength as demonstrated by 5 x STS <14 sec. Baseline: 16 sec  Goal status: IN PROGRESS  5.  Patient will report 9 points improvement on LEFS to demonstrate improved functional ability. Baseline: 41/80 Goal status: IN PROGRESS  6.  Patient will express confidence with transition to community based exercise program. Baseline:  Goal status: IN PROGRESS   7.  Patient will be able to walk for 10 min to start walking program.  Baseline: unable due to pain Goal status: IN PROGRESS 04/18/23- needed seated break after 2 min.   PLAN:  PT FREQUENCY: 1-2x/week  PT DURATION: 8 weeks  PLANNED INTERVENTIONS: 97110-Therapeutic exercises, 97530- Therapeutic activity, [id number]- Neuromuscular re-education, 97535- Self Care, [id number]- Manual therapy, [id number]- Gait training, [phone]- Orthotic Fit/training, 97014- Electrical stimulation (unattended), [id number]- Electrical stimulation (manual), [id number]- Ultrasound, [id number]- Ionotophoresis 4mg /ml Dexamethasone, Balance training, Stair training, Taping, Dry Needling, Joint mobilization, Joint manipulation, Cryotherapy, and Moist heat  PLAN FOR NEXT SESSION: continue to progress HEP as tolerated.   Has PACEMAKER.    Jena Gauss, PT 04/24/2023, 12:38 PM

## 2023-04-25 ENCOUNTER — Ambulatory Visit: Payer: Medicare Other | Admitting: Family Medicine

## 2023-04-25 VITALS — BP 122/60 | HR 57 | Temp 98.2°F | Resp 18 | Ht 62.0 in | Wt 186.4 lb

## 2023-04-25 DIAGNOSIS — T148XXA Other injury of unspecified body region, initial encounter: Secondary | ICD-10-CM | POA: Diagnosis not present

## 2023-04-25 DIAGNOSIS — Z23 Encounter for immunization: Secondary | ICD-10-CM | POA: Diagnosis not present

## 2023-04-25 NOTE — Patient Instructions (Signed)
As we discussed, I believe you developed this little blister on your leg from your knee brace.  We drained it today so it will heal up more quickly- enabling you to wear your brace again.  Give it a few days to heal, then you can try wearing your knee brace but just make sure it is not rubbing.  You may need to wear a layer over the skin or even just a Band-Aid to protect the area.  Okay to bathe and do other activities as normal.  Please let me know if you have any concerns or notes any sign of infection

## 2023-04-25 NOTE — Progress Notes (Signed)
[facility] [address], [address] [place], [place] [address] [phone]  Date:  04/25/2023   Name:  Sheila Schmidt   DOB:  04/17/1936   MRN:  [id number]  PCP:  Pearline Cables, MD    Chief Complaint: Blister (Place is on the outer R leg- outside of the knee. Daughter wonders if this is because of her knee brace. It started off clear. They noticed it this weekend. /Flu shot today:/)   History of Present Illness:  Sheila Schmidt is a 87 y.o. very pleasant female patient who presents with the following:  Patient seen today with concern of a blister on her leg Most recent visit with myself was in June History of nonischemic cardiomyopathy with pacemaker placement in June 2023 (also history of atrial fibrillation, hyperlipidemia, chronic knee pain from arthritis, osteopenia, breast cancer LEFT   She was found to have recurrent breast cancer this year/2024, had a lumpectomy in early April 2024 with a limited node dissection  She is in wearing a custom fitted right hinged knee brace-typically this fits quite well but it does sometimes slip down slightly.  Today is Thursday.  On Saturday they do quite a bit of walking, the next day, Sunday they noted a clear fluid-filled blister on the right lateral knee.  She then seemed to develop some blood inside the blister.  It is not particularly tender, but has not gone away yet  Otherwise she is feeling well  She had a joint fluid supplementation injection 2 days ago, the orthopedist used an anterior approach to avoid blistered area Flu shot today Patient Active Problem List   Diagnosis Date Noted   Hypertensive disorder 01/15/2023   Dyslipidemia 01/15/2023   Genetic testing 09/18/2022   Pacemaker Abbott device BiV 09/06/2022   Malignant neoplasm of upper-outer quadrant of left breast in female, estrogen receptor positive (HCC) 08/06/2022   Pure hypercholesterolemia 05/15/2022    Skin cancer 03/07/2022   Closed nondisplaced fracture of distal phalanx of right thumb 02/15/2022   Atrial fibrillation (HCC) 12/04/2021   Cancer (HCC) 05/25/2021   Left bundle branch block 04/12/2021   Dilated cardiomyopathy (HCC) 04/12/2021   Vasculitis (HCC) 02/22/2021   Anxiety state 02/12/2021   Body fluid retention 02/12/2021   Cervical intraepithelial neoplasia 02/12/2021   Insomnia 02/12/2021   Pruritus of vulva 02/12/2021   Leg wound, right 10/03/2020   Bilateral primary osteoarthritis of knee 03/30/2019   Paroxysmal atrial fibrillation (HCC) 04/07/2015   Chronic anticoagulation 04/07/2015   Paroxysmal atrial flutter (HCC) 03/16/2015   Essential hypertension, benign 02/24/2014   Sciatica 12/08/2013   Hyperlipidemia 09/25/2013   Osteopenia 11/19/2012   Low back pain 01/26/2010   Carcinoma in situ of breast 09/04/2009   DEPRESSION, PROLONGED 09/04/2009   OA (osteoarthritis) of knee 09/04/2009   PERIPHERAL EDEMA 09/04/2009   History of colonic polyps 09/04/2009    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   Dysrhythmia    Afib   Heart murmur    Hx of adenomatous colonic polyps    Hyperlipidemia    Presence of permanent cardiac pacemaker     Past Surgical History:  Procedure Laterality Date   ATRIAL FLUTTER ABLATION     BIV PACEMAKER INSERTION CRT-P N/A  12/04/2021   Procedure: BIV PACEMAKER INSERTION CRT-P;  Surgeon: Lanier Prude, MD;  Location: [facility] INVASIVE CV LAB;  Service: Cardiovascular;  Laterality: N/A;   BREAST BIOPSY Left 07/30/2022   [place] LT BREAST BX W LOC DEV 1ST LESION IMG BX SPEC US GUIDE 07/30/2022 [facility]MOGRAP[facility]   BREAST BIOPSY Left 09/10/2022   [place] LT RADIOACTIVE SEED LOC 09/10/2022 [facility]MO[facility]   BREAST BIOPSY Left 09/10/2022   [place] LT RADIOACTIVE SEED LOC 09/10/2022 [facility]MO[facility]   BREAST EXCISIONAL  BIOPSY Left 2010   benign   BREAST LUMPECTOMY WITH RADIOACTIVE SEED AND SENTINEL LYMPH NODE BIOPSY Left 09/11/2022   Procedure: LEFT BREAST LUMPECTOMY WITH RADIOACTIVE SEED;  Surgeon: Harriette Bouillon, MD;  Location: [facility] OR;  Service: General;  Laterality: Left;   CARDIOVERSION  05/2020   in [facility]   CARDIOVERSION N/A 06/21/2021   Procedure: CARDIOVERSION;  Surgeon: Sande Rives, MD;  Location: [facility] ENDOSCOPY;  Service: Cardiovascular;  Laterality: N/A;   CARDIOVERSION N/A 03/26/2022   Procedure: CARDIOVERSION;  Surgeon: Lewayne Bunting, MD;  Location: [facility] ENDOSCOPY;  Service: Cardiovascular;  Laterality: N/A;   CHOLECYSTECTOMY     CHOLECYSTECTOMY, LAPAROSCOPIC  '06   [name]   KNEE ARTHROSCOPY     Left '00 Rendall/ Right '08   lumpectomy- remote     benign   RADIOACTIVE SEED GUIDED AXILLARY SENTINEL LYMPH NODE Left 09/11/2022   Procedure: RADIOACTIVE SEED GUIDED LEFT SENTINEL LYMPH NODE BIOPSY;  Surgeon: Harriette Bouillon, MD;  Location: [facility] OR;  Service: General;  Laterality: Left;   TOOTH EXTRACTION     TOTAL KNEE ARTHROPLASTY  02/05/2011   left    Social History   Tobacco Use   Smoking status: Never    Passive exposure: Never   Smokeless tobacco: Never  Vaping Use   Vaping status: Never Used  Substance Use Topics   Alcohol use: Yes    Comment: 1 glass per day   Drug use: No    Family History  Problem Relation Age of Onset   Diabetes Mother        Deceased in 28s   Heart disease Mother        cad/MI-fatal   Heart attack Mother    Liver cancer Father 75   Heart disease Sister    Breast cancer Neg Hx    Colon cancer Neg Hx     No Known Allergies  Medication list has been reviewed and updated.  Current Outpatient Medications on File Prior to Visit  Medication Sig Dispense Refill   acetaminophen (TYLENOL) 500 MG tablet Take 500-1,000 mg by mouth every 8 (eight) hours as needed for moderate pain.     acetaminophen-codeine (TYLENOL #3) 300-30 MG tablet Take 1 tablet by  mouth every 6 (six) hours as needed for moderate pain. 30 tablet 0   amiodarone (PACERONE) 100 MG tablet Take 1 tablet (100 mg total) by mouth daily. 90 tablet 2   atorvastatin (LIPITOR) 20 MG tablet Take 1 tablet (20 mg total) by mouth daily. 90 tablet 1   Calcium Carbonate-Vit D-Min (CALTRATE 600+D PLUS MINERALS) 600-800 MG-UNIT CHEW Chew 1 each by mouth daily.     Cholecalciferol 50 MCG (2000 UT) TABS Take 2,000 Units by mouth daily.     ENTRESTO 24-26 MG TAKE 1 TABLET BY MOUTH TWICE  DAILY 180 tablet 3   famotidine (PEPCID) 40 MG tablet Take 40 mg by mouth daily as needed for heartburn or indigestion.     furosemide (LASIX) 20 MG  tablet Take 20 mg by mouth every other day.     melatonin 5 MG TABS Take 1.25 mg by mouth at bedtime as needed (sleep).     rivaroxaban (XARELTO) 20 MG TABS tablet Take 1 tablet (20 mg total) by mouth daily with supper. 90 tablet 1   tamoxifen (NOLVADEX) 20 MG tablet Take 1 tablet (20 mg total) by mouth daily. 30 tablet 5   venlafaxine XR (EFFEXOR-XR) 37.5 MG 24 hr capsule Take 1 capsule (37.5 mg total) by mouth daily with breakfast. 90 capsule 1   vitamin B-12 (CYANOCOBALAMIN) 100 MCG tablet Take 100 mcg by mouth daily.     No current facility-administered medications on file prior to visit.    Review of Systems:  As per HPI- otherwise negative.   Physical Examination: Vitals:   04/25/23 1135  BP: 122/60  Pulse: (!) 57  Resp: 18  Temp: 98.2 F (36.8 C)  SpO2: 98%   Vitals:   04/25/23 1135  Weight: 186 lb 6.4 oz (84.6 kg)  Height: 5\' 2"  (1.575 m)   Body mass index is 34.09 kg/m. Ideal Body Weight: Weight in (lb) to have BMI = 25: 136.4  GEN: no acute distress.  Looks well and her normal self HEENT: Atraumatic, Normocephalic.  Ears and Nose: No external deformity. CV: RRR, No M/G/R. No JVD. No thrill. No extra heart sounds. PULM: CTA B, no wheezes, crackles, rhonchi. No retractions. No resp. distress. No accessory muscle use. EXTR: No  c/c/e PSYCH: Normally interactive. Conversant.  She has an approximately 8 mm diameter blood-filled blister on the right lateral knee   Discussed simply leaving this blister alone and allowing to heal naturally.  However, patient is eager to get back in her knee brace as it does help her walk more easily.  As such we decided to puncture and drain the blister. Verbal consent obtained, prepped area with alcohol swab and punctured skin of a blister with needle.  Approximately 1 mL of blood drained from inside blister.  Left skin over top of blister.  Patient tolerated well with no immediate complications Assessment and Plan: Blister  Immunization due - Plan: Flu Vaccine Trivalent High Dose (Fluad), CANCELED: Flu vaccine trivalent PF, 6mos and older(Flulaval,Afluria,Fluarix,Fluzone)  Left, patient came in today with concern of a blister over her right knee, almost certainly caused by rubbing from her knee brace.  Drained blister today to help it heal more quickly.  They will watch for any sign of infection.  As soon as it is adequately healed she can go back to wearing her knee brace, encouraged him to use increased padding over this area to avoid further trauma  Signed Abbe Amsterdam, MD

## 2023-04-28 NOTE — Assessment & Plan Note (Signed)
pT1bN1aM0, stage IA, ER+/PR+/HER2-, G2 -She was diagnosed in February 2024 -She underwent left breast lumpectomy and sentinel lymph node biopsy, I discussed her surgical findings with patient in detail.  Surgical margins were negative -Adjuvant radiation was recommended and offered, however due to her pacemaker on the same side of her breast cancer, the ultimate decision was not to proceed with adjuvant radiation due to her pacemaker and her advanced age. -Due to her advanced age, she is not a candidate for chemotherapy, so Oncotype was not obtained. -She started adjuvant tamoxifen in early June 2020, tolerating well overall, plan for 5 years  -continue cancer surveillance

## 2023-04-29 ENCOUNTER — Inpatient Hospital Stay: Payer: Medicare Other | Attending: Adult Health

## 2023-04-29 ENCOUNTER — Inpatient Hospital Stay (HOSPITAL_BASED_OUTPATIENT_CLINIC_OR_DEPARTMENT_OTHER): Payer: Medicare Other | Admitting: Hematology

## 2023-04-29 ENCOUNTER — Encounter: Payer: Self-pay | Admitting: Hematology

## 2023-04-29 VITALS — BP 141/64 | HR 60 | Temp 98.0°F | Resp 17 | Wt 184.2 lb

## 2023-04-29 DIAGNOSIS — C50412 Malignant neoplasm of upper-outer quadrant of left female breast: Secondary | ICD-10-CM | POA: Diagnosis present

## 2023-04-29 DIAGNOSIS — Z170 Estrogen receptor positive status [ER+]: Secondary | ICD-10-CM | POA: Insufficient documentation

## 2023-04-29 DIAGNOSIS — M85851 Other specified disorders of bone density and structure, right thigh: Secondary | ICD-10-CM | POA: Insufficient documentation

## 2023-04-29 DIAGNOSIS — Z1732 Human epidermal growth factor receptor 2 negative status: Secondary | ICD-10-CM | POA: Diagnosis not present

## 2023-04-29 DIAGNOSIS — I890 Lymphedema, not elsewhere classified: Secondary | ICD-10-CM | POA: Diagnosis not present

## 2023-04-29 DIAGNOSIS — Z79810 Long term (current) use of selective estrogen receptor modulators (SERMs): Secondary | ICD-10-CM | POA: Insufficient documentation

## 2023-04-29 DIAGNOSIS — Z1721 Progesterone receptor positive status: Secondary | ICD-10-CM | POA: Insufficient documentation

## 2023-04-29 LAB — CBC WITH DIFFERENTIAL (CANCER CENTER ONLY)
Abs Immature Granulocytes: 0.02 10*3/uL (ref 0.00–0.07)
Basophils Absolute: 0.1 10*3/uL (ref 0.0–0.1)
Basophils Relative: 1 %
Eosinophils Absolute: 0.1 10*3/uL (ref 0.0–0.5)
Eosinophils Relative: 1 %
HCT: 39.9 % (ref 36.0–46.0)
Hemoglobin: 13.1 g/dL (ref 12.0–15.0)
Immature Granulocytes: 0 %
Lymphocytes Relative: 37 %
Lymphs Abs: 3.3 10*3/uL (ref 0.7–4.0)
MCH: 34 pg (ref 26.0–34.0)
MCHC: 32.8 g/dL (ref 30.0–36.0)
MCV: 103.6 fL — ABNORMAL HIGH (ref 80.0–100.0)
Monocytes Absolute: 0.8 10*3/uL (ref 0.1–1.0)
Monocytes Relative: 8 %
Neutro Abs: 4.8 10*3/uL (ref 1.7–7.7)
Neutrophils Relative %: 53 %
Platelet Count: 175 10*3/uL (ref 150–400)
RBC: 3.85 MIL/uL — ABNORMAL LOW (ref 3.87–5.11)
RDW: 13.2 % (ref 11.5–15.5)
WBC Count: 9.1 10*3/uL (ref 4.0–10.5)
nRBC: 0 % (ref 0.0–0.2)

## 2023-04-29 LAB — CMP (CANCER CENTER ONLY)
ALT: 17 U/L (ref 0–44)
AST: 22 U/L (ref 15–41)
Albumin: 3.7 g/dL (ref 3.5–5.0)
Alkaline Phosphatase: 45 U/L (ref 38–126)
Anion gap: 4 — ABNORMAL LOW (ref 5–15)
BUN: 19 mg/dL (ref 8–23)
CO2: 30 mmol/L (ref 22–32)
Calcium: 9.3 mg/dL (ref 8.9–10.3)
Chloride: 107 mmol/L (ref 98–111)
Creatinine: 1.04 mg/dL — ABNORMAL HIGH (ref 0.44–1.00)
GFR, Estimated: 53 mL/min — ABNORMAL LOW (ref >60)
Glucose, Bld: 105 mg/dL — ABNORMAL HIGH (ref 70–99)
Potassium: 4.2 mmol/L (ref 3.5–5.1)
Sodium: 141 mmol/L (ref 135–145)
Total Bilirubin: 1.3 mg/dL — ABNORMAL HIGH (ref <1.2)
Total Protein: 6.3 g/dL — ABNORMAL LOW (ref 6.5–8.1)

## 2023-04-29 NOTE — Progress Notes (Signed)
[facility]   [phone] [phone]   Clinic Follow up Note   Patient Care Team: Copland, Gwenlyn Found, MD as PCP - General (Family Medicine) Lanier Prude, MD as PCP - Electrophysiology (Cardiology) Valeria Batman, MD (Inactive) (Orthopedic Surgery) Annamaria Helling, MD (Obstetrics and Gynecology) Harriette Bouillon, MD (General Surgery) Joneen Caraway, PA-C as Physician Assistant (Dermatology) Harriette Bouillon, MD as Consulting Physician (General Surgery) Malachy Mood, MD as Consulting Physician (Hematology) Antony Blackbird, MD as Consulting Physician (Radiation Oncology) Donnelly Angelica, RN as Oncology Nurse Navigator Pershing Proud, RN as Oncology Nurse Navigator Carlean Jews, NP as Nurse Practitioner (Family Medicine)  Date of Service:  04/29/2023  CHIEF COMPLAINT: f/u of left breast cancer  CURRENT THERAPY:  Tamoxifen 20 mg daily since June 2020  Oncology History   Malignant neoplasm of upper-outer quadrant of left breast in female, estrogen receptor positive (HCC) pT1bN1aM0, stage IA, ER+/PR+/HER2-, G2 -She was diagnosed in February 2024 -She underwent left breast lumpectomy and sentinel lymph node biopsy, I discussed her surgical findings with patient in detail.  Surgical margins were negative -Adjuvant radiation was recommended and offered, however due to her pacemaker on the same side of her breast cancer, the ultimate decision was not to proceed with adjuvant radiation due to her pacemaker and her advanced age. -Due to her advanced age, she is not a candidate for chemotherapy, so Oncotype was not obtained. -She started adjuvant tamoxifen in early June 2020, tolerating well overall, plan for 5 years  -continue cancer surveillance     Assessment and Plan    Breast Cancer (Post-Treatment) 87 year old with a history of breast cancer, status post-surgery and infection earlier this year.     Currently on tamoxifen, restarted on  January 20, 2023, with no issues. Previous seroma and infection managed by Dr. Mignon Pine with drainage and powder treatment. Cancer-free and on tamoxifen to prevent recurrence. Explained tamoxifen's role in preventing cancer spread and recurrence, to be continued for five years if tolerated, with the option to stop earlier due to her advanced age if issues arise. - Continue tamoxifen - Order diagnostic mammogram for July 17, 2023 - Schedule follow-up every six months - Monitor for new symptoms or side effects from tamoxifen  Lymphedema Lymphedema in the left arm post-breast cancer surgery. No significant swelling currently, but concerned about potential recurrence. Previous episodes managed with drainage and powder treatment. Advised to use compression garments as needed and report any new swelling or discomfort. - Monitor for signs of lymphedema - Encourage use of compression garments as needed - Report any new swelling or discomfort  Osteopenia  -Bone density scan in March or 2023 showed osteopenia with T-score -2.4 in the right femoral neck -She is currently on tamoxifen which can potentially strengthen her bone density -Continue calcium and vitamin D  Follow-up - Schedule next follow-up appointment in six months - Ensure diagnostic mammogram is scheduled for February, 2025 - Advise to call if any new symptoms or concerns arise.    -Continue tamoxifen     SUMMARY OF ONCOLOGIC HISTORY: Oncology History Overview Note   Cancer Staging  Malignant neoplasm of upper-outer quadrant of left breast in female, estrogen receptor positive (HCC) Staging form: Breast, AJCC 8th Edition - Clinical stage from 07/30/2022: Stage IB (cT1b, cN1, cM0, G2, ER+, PR+, HER2-) - Signed by Malachy Mood, MD on 08/07/2022 Stage prefix: Initial diagnosis Histologic grading system: 3 grade system - Pathologic stage from 09/11/2022: Stage IA (pT1b, pN1a, cM0, G2,  ER+, PR+, HER2-) - Signed by Malachy Mood, MD on  09/24/2022 Stage prefix: Initial diagnosis Histologic grading system: 3 grade system Residual tumor (R): R0 - None     Malignant tumor of breast (HCC) (Resolved)  06/11/2009 Initial Diagnosis   Malignant tumor of breast (HCC)   Malignant neoplasm of upper-outer quadrant of left breast in female, estrogen receptor positive (HCC)  07/30/2022 Cancer Staging   Staging form: Breast, AJCC 8th Edition - Clinical stage from 07/30/2022: Stage IB (cT1b, cN1, cM0, G2, ER+, PR+, HER2-) - Signed by Malachy Mood, MD on 08/07/2022 Stage prefix: Initial diagnosis Histologic grading system: 3 grade system   08/06/2022 Initial Diagnosis   Malignant neoplasm of upper-outer quadrant of left breast in female, estrogen receptor positive (HCC)   08/23/2022 Genetic Testing   Negative [employer] Common Hereditary Cancers +RNA Panel.  VUS detected in PMS2 at c.791A>C (p.His264Pro) and POLE at c.444G>C (p.Leu148Phe).  Report date is 08/23/2022.    The [employer] Common Hereditary Cancers + RNA Panel includes sequencing, deletion/duplication, and RNA analysis of the following 48 genes: APC, ATM, AXIN2, BAP1, BARD1, BMPR1A, BRCA1, BRCA2, BRIP1, CDH1, CDK4*, CDKN2A*, CHEK2, CTNNA1, DICER1, EPCAM* (del/dup only), FH, GREM1* (promoter dup analysis only), HOXB13*, KIT*, MBD4*, MEN1, MLH1, MSH2, MSH3, MSH6, MUTYH, NF1, NTHL1, PALB2, PDGFRA*, PMS2, POLD1, POLE, PTEN, RAD51C, RAD51D, SDHA (sequencing only), SDHB, SDHC, SDHD, SMAD4, SMARCA4, STK11, TP53, TSC1, TSC2, VHL.  *Genes without RNA analysis.    09/11/2022 Cancer Staging   Staging form: Breast, AJCC 8th Edition - Pathologic stage from 09/11/2022: Stage IA (pT1b, pN1a, cM0, G2, ER+, PR+, HER2-) - Signed by Malachy Mood, MD on 09/24/2022 Stage prefix: Initial diagnosis Histologic grading system: 3 grade system Residual tumor (R): R0 - None   09/11/2022 Pathology Results     FINAL MICROSCOPIC DIAGNOSIS:  A. BREAST, LEFT, LUMPECTOMY: Invasive ductal carcinoma, 0.8 cm, grade  II/III Ductal carcinoma in situ: Not identified Margins, invasive: Negative     Closest, invasive: Posterior at 6 mm Margins, DCIS: N/A     Closest, DCIS: N/A Lymphovascular invasion: Suspicious for small arteriolar invasion Prognostic markers:  ER positive, PR positive, Her2 negative, Ki-67 20% % Other: N/A See oncology table  B. LYMPH NODE, LEFT AXILLA, SENTINEL, EXCISION: -  1 lymph node positive for malignancy, 18 mm in greatest dimension, with extracapsular extension (1/1).  C. LYMPH NODE, LEFT AXILLA, SENTINEL, EXCISION: -  1 lymph node positive for malignancy, 4 mm in greatest dimension (1/1).  D. LYMPH NODE, LEFT AXILLA, SENTINEL, EXCISION: -  1 lymph node negative for malignancy (0/1).  E. BREAST, LEFT ADDITIONAL MEDIAL MARGIN, EXCISION: -  Benign breast tissue, negative for malignancy. -  New additional margin thickness 7 mm.  F. BREAST, LEFT ADDITIONAL SUPERIOR MARGIN, EXCISION: -  Benign breast tissue, negative for malignancy. -  New additional margin thickness 13 mm.  G. BREAST, LEFT ADDITIONAL INFERIOR MARGIN, EXCISION: -  Benign breast tissue, negative for malignancy. -  New additional margin thickness 6 mm  H. BREAST, LEFT ADDITIONAL LATERAL MARGIN, EXCISION: -  Benign breast tissue, negative for malignancy. -  New additional margin thickness 5 mm.       Discussed the use of AI scribe software for clinical note transcription with the patient, who gave verbal consent to proceed.  History of Present Illness   The patient, an 87 year old female with a history of breast cancer, presents with concerns about her chest's appearance post-surgery and potential lymphedema. She reports that her left breast swelled up recently, which  she managed by wearing a compression bra. She denies any pain or tenderness in the area. Earlier in the year, she had an infection in the breast, which has since resolved. She has been on tamoxifen for about three months, which she  tolerates well. She also reports a knee issue, for which she uses a custom-made brace.         All other systems were reviewed with the patient and are negative.  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   Dysrhythmia    Afib   Heart murmur    Hx of adenomatous colonic polyps    Hyperlipidemia    Presence of permanent cardiac pacemaker     SURGICAL HISTORY: Past Surgical History:  Procedure Laterality Date   ATRIAL FLUTTER ABLATION     BIV PACEMAKER INSERTION CRT-P N/A 12/04/2021   Procedure: BIV PACEMAKER INSERTION CRT-P;  Surgeon: Lanier Prude, MD;  Location: [facility] INVASIVE CV LAB;  Service: Cardiovascular;  Laterality: N/A;   BREAST BIOPSY Left 07/30/2022   [place] LT BREAST BX W LOC DEV 1ST LESION IMG BX SPEC US GUIDE 07/30/2022 [facility]MO[facility]   BREAST BIOPSY Left 09/10/2022   [place] LT RADIOACTIVE SEED LOC 09/10/2022 [facility]AMMOGR[facility]HY   BREAST BIOPSY Left 09/10/2022   [place] LT RADIOACTIVE SEED LOC 09/10/2022 [facility]AMMOGR[facility]   BREAST EXCISIONAL BIOPSY Left 2010   benign   BREAST LUMPECTOMY WITH RADIOACTIVE SEED AND SENTINEL LYMPH NODE BIOPSY Left 09/11/2022   Procedure: LEFT BREAST LUMPECTOMY WITH RADIOACTIVE SEED;  Surgeon: Harriette Bouillon, MD;  Location: [facility] OR;  Service: General;  Laterality: Left;   CARDIOVERSION  05/2020   in [facility]   CARDIOVERSION N/A 06/21/2021   Procedure: CARDIOVERSION;  Surgeon: Sande Rives, MD;  Location: [facility] ENDOSCOPY;  Service: Cardiovascular;  Laterality: N/A;   CARDIOVERSION N/A 03/26/2022   Procedure: CARDIOVERSION;  Surgeon: Lewayne Bunting, MD;  Location: [facility] ENDOSCOPY;  Service: Cardiovascular;  Laterality: N/A;   CHOLECYSTECTOMY     CHOLECYSTECTOMY, LAPAROSCOPIC  '06   [name]   KNEE ARTHROSCOPY     Left '00 [place]/ Right '08   lumpectomy- remote     benign   RADIOACTIVE SEED GUIDED  AXILLARY SENTINEL LYMPH NODE Left 09/11/2022   Procedure: RADIOACTIVE SEED GUIDED LEFT SENTINEL LYMPH NODE BIOPSY;  Surgeon: Harriette Bouillon, MD;  Location: [facility] OR;  Service: General;  Laterality: Left;   TOOTH EXTRACTION     TOTAL KNEE ARTHROPLASTY  02/05/2011   left    I have reviewed the social history and family history with the patient and they are unchanged from previous note.  ALLERGIES:  has No Known Allergies.  MEDICATIONS:  Current Outpatient Medications  Medication Sig Dispense Refill   acetaminophen (TYLENOL) 500 MG tablet Take 500-1,000 mg by mouth every 8 (eight) hours as needed for moderate pain.     acetaminophen-codeine (TYLENOL #3) 300-30 MG tablet Take 1 tablet by mouth every 6 (six) hours as needed for moderate pain. 30 tablet 0   amiodarone (PACERONE) 100 MG tablet Take 1 tablet (100 mg total) by mouth daily. 90 tablet 2   atorvastatin (LIPITOR) 20 MG tablet Take 1 tablet (20 mg total) by mouth daily. 90 tablet 1   Calcium Carbonate-Vit D-Min (CALTRATE 600+D PLUS MINERALS) 600-800 MG-UNIT CHEW Chew 1 each by  mouth daily.     Cholecalciferol 50 MCG (2000 UT) TABS Take 2,000 Units by mouth daily.     ENTRESTO 24-26 MG TAKE 1 TABLET BY MOUTH TWICE  DAILY 180 tablet 3   famotidine (PEPCID) 40 MG tablet Take 40 mg by mouth daily as needed for heartburn or indigestion.     furosemide (LASIX) 20 MG tablet Take 20 mg by mouth every other day.     melatonin 5 MG TABS Take 1.25 mg by mouth at bedtime as needed (sleep).     rivaroxaban (XARELTO) 20 MG TABS tablet Take 1 tablet (20 mg total) by mouth daily with supper. 90 tablet 1   tamoxifen (NOLVADEX) 20 MG tablet Take 1 tablet (20 mg total) by mouth daily. 30 tablet 5   venlafaxine XR (EFFEXOR-XR) 37.5 MG 24 hr capsule Take 1 capsule (37.5 mg total) by mouth daily with breakfast. 90 capsule 1   vitamin B-12 (CYANOCOBALAMIN) 100 MCG tablet Take 100 mcg by mouth daily.     No current facility-administered medications for this  visit.    PHYSICAL EXAMINATION: ECOG PERFORMANCE STATUS: 1 - Symptomatic but completely ambulatory  Vitals:   04/29/23 1246 04/29/23 1247  BP: (!) 154/77 (!) 141/64  Pulse: 60   Resp: 17   Temp: 98 F (36.7 C)   SpO2: 94%    Wt Readings from Last 3 Encounters:  04/29/23 184 lb 3.2 oz (83.6 kg)  04/25/23 186 lb 6.4 oz (84.6 kg)  04/23/23 182 lb (82.6 kg)     GENERAL:alert, no distress and comfortable SKIN: skin color, texture, turgor are normal, no rashes or significant lesions EYES: normal, Conjunctiva are pink and non-injected, sclera clear NECK: supple, thyroid normal size, non-tender, without nodularity LYMPH:  no palpable lymphadenopathy in the cervical, axillary  LUNGS: clear to auscultation and percussion with normal breathing effort HEART: regular rate & rhythm and no murmurs and no lower extremity edema ABDOMEN:abdomen soft, non-tender and normal bowel sounds Musculoskeletal:no cyanosis of digits and no clubbing  NEURO: alert & oriented x 3 with fluent speech, no focal motor/sensory deficits BREAST: No redness, no tenderness, scar tissue present from previous incision in the armpit, healed well. EXTREMITIES: Left forearm slightly larger than right     LABORATORY DATA:  I have reviewed the data as listed    Latest Ref Rng & Units 04/29/2023   12:26 PM 12/06/2022   10:43 AM 11/27/2022    9:44 AM  CBC  WBC 4.0 - 10.5 K/uL 9.1  9.6  11.0   Hemoglobin 12.0 - 15.0 g/dL 30.1  60.1  09.3   Hematocrit 36.0 - 46.0 % 39.9  38.5  37.5   Platelets 150 - 400 K/uL 175  272.0  230         Latest Ref Rng & Units 04/29/2023   12:26 PM 01/24/2023   11:33 AM 12/20/2022    2:56 PM  CMP  Glucose 70 - 99 mg/dL 235  54  90   BUN 8 - 23 mg/dL 19  20  13    Creatinine 0.44 - 1.00 mg/dL 5.73  2.20  2.54   Sodium 135 - 145 mmol/L 141  141  139   Potassium 3.5 - 5.1 mmol/L 4.2  4.3  4.2   Chloride 98 - 111 mmol/L 107  103  104   CO2 22 - 32 mmol/L 30  24  29    Calcium 8.9 - 10.3  mg/dL 9.3  9.8  9.2   Total Protein  6.5 - 8.1 g/dL 6.3  6.2    Total Bilirubin <1.2 mg/dL 1.3  1.1    Alkaline Phos 38 - 126 U/L 45  80    AST 15 - 41 U/L 22  27    ALT 0 - 44 U/L 17  29        RADIOGRAPHIC STUDIES: I have personally reviewed the radiological images as listed and agreed with the findings in the report. No results found.    Orders Placed This Encounter  Procedures   MM 3D DIAGNOSTIC MAMMOGRAM BILATERAL BREAST    Standing Status:   Future    Standing Expiration Date:   04/28/2024    Order Specific Question:   Reason for Exam (SYMPTOM  OR DIAGNOSIS REQUIRED)    Answer:   SCREENING    Order Specific Question:   Preferred imaging location?    Answer:   [facility]   All questions were answered. The patient knows to call the clinic with any problems, questions or concerns. No barriers to learning was detected. The total time spent in the appointment was 25 minutes.     Malachy Mood, MD 04/29/2023

## 2023-05-01 ENCOUNTER — Ambulatory Visit: Payer: Medicare Other | Admitting: Physical Therapy

## 2023-05-01 ENCOUNTER — Encounter: Payer: Self-pay | Admitting: Physical Therapy

## 2023-05-01 DIAGNOSIS — R262 Difficulty in walking, not elsewhere classified: Secondary | ICD-10-CM

## 2023-05-01 DIAGNOSIS — M6281 Muscle weakness (generalized): Secondary | ICD-10-CM

## 2023-05-01 DIAGNOSIS — M25561 Pain in right knee: Secondary | ICD-10-CM | POA: Diagnosis not present

## 2023-05-01 DIAGNOSIS — G8929 Other chronic pain: Secondary | ICD-10-CM

## 2023-05-01 DIAGNOSIS — R2681 Unsteadiness on feet: Secondary | ICD-10-CM

## 2023-05-01 NOTE — Therapy (Signed)
OUTPATIENT PHYSICAL THERAPY LOWER EXTREMITY TREATMENT   Patient Name: Sheila Schmidt MRN: [id number] DOB:28-Dec-1935, 87 y.o., female Today's Date: 05/01/2023  END OF SESSION:  PT End of Session - 05/01/23 1107     Visit Number 5    Date for PT Re-Evaluation 06/05/23    Authorization Type [employer] Medicare    Progress Note Due on Visit 10    PT Start Time 1104    PT Stop Time 1154    PT Time Calculation (min) 50 min    Activity Tolerance Patient tolerated treatment well    Behavior During Therapy WFL for tasks assessed/performed              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   Dysrhythmia    Afib   Heart murmur    Hx of adenomatous colonic polyps    Hyperlipidemia    Presence of permanent cardiac pacemaker    Past Surgical History:  Procedure Laterality Date   ATRIAL FLUTTER ABLATION     BIV PACEMAKER INSERTION CRT-P N/A 12/04/2021   Procedure: BIV PACEMAKER INSERTION CRT-P;  Surgeon: Lanier Prude, MD;  [facility];  Service: Cardiovascular;  Laterality: N/A;   BREAST BIOPSY Left 07/30/2022   [place] LT BREAST BX W LOC DEV 1ST LESION IMG BX SPEC US GUIDE 07/30/2022 [facility]   BREAST BIOPSY Left 09/10/2022   [place] LT RADIOACTIVE SEED LOC 09/10/2022 [facility]   BREAST BIOPSY Left 09/10/2022   [place] LT RADIOACTIVE SEED LOC 09/10/2022 [facility]   BREAST EXCISIONAL BIOPSY Left 2010   benign   BREAST LUMPECTOMY WITH RADIOACTIVE SEED AND SENTINEL LYMPH NODE BIOPSY Left 09/11/2022   Procedure: LEFT BREAST LUMPECTOMY WITH RADIOACTIVE SEED;  Surgeon: Harriette Bouillon, MD;  Location: [facility];  Service: General;  Laterality: Left;   CARDIOVERSION  05/2020   in [facility]   CARDIOVERSION N/A 06/21/2021   Procedure: CARDIOVERSION;  Surgeon: Sande Rives, MD;  Location: [facility] ENDOSCOPY;  Service: Cardiovascular;   Laterality: N/A;   CARDIOVERSION N/A 03/26/2022   Procedure: CARDIOVERSION;  Surgeon: Lewayne Bunting, MD;  Location: [facility] ENDOSCOPY;  Service: Cardiovascular;  Laterality: N/A;   CHOLECYSTECTOMY     CHOLECYSTECTOMY, LAPAROSCOPIC  '06   [name]   KNEE ARTHROSCOPY     Left '00 [place]/ Right '08   lumpectomy- remote     benign   RADIOACTIVE SEED GUIDED AXILLARY SENTINEL LYMPH NODE Left 09/11/2022   Procedure: RADIOACTIVE SEED GUIDED LEFT SENTINEL LYMPH NODE BIOPSY;  Surgeon: Harriette Bouillon, MD;  Location[facility];  Service: General;  Laterality: Left;   TOOTH EXTRACTION     TOTAL KNEE ARTHROPLASTY  02/05/2011   left   Patient Active Problem List   Diagnosis Date Noted   Hypertensive disorder 01/15/2023   Dyslipidemia 01/15/2023   Genetic testing 09/18/2022   Pacemaker Abbott device BiV 09/06/2022   Malignant neoplasm of upper-outer quadrant of left breast in female, estrogen receptor positive (HCC) 08/06/2022   Pure hypercholesterolemia 05/15/2022   Skin cancer 03/07/2022   Closed nondisplaced fracture of distal phalanx of right thumb 02/15/2022   Atrial fibrillation (HCC) 12/04/2021   Cancer (HCC) 05/25/2021   Left bundle branch block 04/12/2021   Dilated cardiomyopathy (HCC) 04/12/2021   Vasculitis (HCC) 02/22/2021   Anxiety state 02/12/2021  Body fluid retention 02/12/2021   Cervical intraepithelial neoplasia 02/12/2021   Insomnia 02/12/2021   Pruritus of vulva 02/12/2021   Leg wound, right 10/03/2020   Bilateral primary osteoarthritis of knee 03/30/2019   Paroxysmal atrial fibrillation (HCC) 04/07/2015   Chronic anticoagulation 04/07/2015   Paroxysmal atrial flutter (HCC) 03/16/2015   Essential hypertension, benign 02/24/2014   Sciatica 12/08/2013   Hyperlipidemia 09/25/2013   Osteopenia 11/19/2012   Low back pain 01/26/2010   Carcinoma in situ of breast 09/04/2009   DEPRESSION, PROLONGED 09/04/2009   OA (osteoarthritis) of knee 09/04/2009   PERIPHERAL EDEMA 09/04/2009    History of colonic polyps 09/04/2009    PCP: Pearline Cables, MD   REFERRING PROVIDER: Judi Saa, DO  REFERRING DIAG: M17.0 (ICD-10-CM) - Bilateral primary osteoarthritis of knee   THERAPY DIAG:  Chronic pain of right knee  Muscle weakness (generalized)  Difficulty in walking, not elsewhere classified  Unsteadiness on feet  Rationale for Evaluation and Treatment: Rehabilitation  ONSET DATE: chronic, worsened Sept 2023  SUBJECTIVE:   SUBJECTIVE STATEMENT:   Sheila Schmidt reports soreness after last session.  Hurts today but weather doesn't help.  She did go to her MD about blister on knee who lanced it, healing well.   PERTINENT HISTORY: Afib, history of L breast cancer, L TKA 2012, osteopenia PAIN:  Are you having pain? Yes: NPRS scale: 4/10 Pain location: R knee  Pain description: sharp, shooting Aggravating factors: turning, twisting Relieving factors: putting feet up, doing floor bike, walking with hands on shopping cart  PRECAUTIONS: ICD/Pacemaker and history of breast cancer  RED FLAGS: None   WEIGHT BEARING RESTRICTIONS: No  FALLS:  Has patient fallen in last 6 months? No  LIVING ENVIRONMENT: Lives with: lives with their daughter Lives in: House/apartment Stairs:  stairs to basement apartment, rarely goes down, 4-5 steps to garage, has railing Has following equipment at home: Single point cane  OCCUPATION: retired  PLOF: Independent  PATIENT GOALS: be able to walk!  Wants to be able to walk [place] at [facility] when visits other daughter, be motivated to exercise again  NEXT MD VISIT: 04/23/23 Dr. Denyse Amass for injection?  OBJECTIVE:  Note: Objective measures were completed at Evaluation unless otherwise noted.  DIAGNOSTIC FINDINGS: DG R Knee 02/20/2023 FINDINGS: No evidence of fracture, dislocation, or joint effusion. There is tricompartmental joint space narrowing, sclerosis and osteophytes consistent with degenerative joint  disease. Moderate suprapatellar effusion is seen.  PATIENT SURVEYS:  LEFS 41/80  COGNITION: Overall cognitive status: Within functional limits for tasks assessed     SENSATION: WFL  EDEMA:  Slight swelling in RLE compared to LLE, but not significantly different with circumferential measurement (20.5 cm bil)  MUSCLE LENGTH: NT  POSTURE: rounded shoulders and forward head  PALPATION: Tenderness R medial knee joint line, limited patellar mobility especially inf/sup direction, suprapatellar edema  LOWER EXTREMITY ROM:  Passive ROM Right eval Left eval  Knee flexion 125 126  Knee extension Lacking 5 0  Ankle dorsiflexion     (Blank rows = not tested)  LOWER EXTREMITY MMT:  MMT Right Eval -seated Left Eval -seated  Hip flexion 4+ 4+  Hip extension 4 4  Hip abduction 5 5  Hip adduction 4+ 4+  Hip internal rotation    Hip external rotation    Knee flexion 4+ 4+  Knee extension 5 5  Ankle dorsiflexion 5 5  Ankle plantarflexion     (Blank rows = not tested)  FUNCTIONAL TESTS:  5 times sit to stand: 16 seconds  without UE assist.  04/18/23- - 450', dyspnea on exertion, seated rest break needed, wearing donjoy brace on R  GAIT: Distance walked: 50' Assistive device utilized: None Level of assistance: Complete Independence Comments: visually slow, antalgic, lacking TKE on R   TODAY'S TREATMENT:                                                                                                                              DATE:   05/01/23 Therapeutic Exercise: to improve strength and mobility.  Demo, verbal and tactile cues throughout for technique. Bike L 1 x 6 min seat 3  Seated R/L LAQ for isometric hold x 10 with 10 sec hold 1# Seated step overs for hip flexor strengthening 3 x 10 R/L, 1# Supine SLR 3 x 10 bil  Bridges 2 x 10  HEP review and update - also added SLS for balance, with demo at counter.  Manual Therapy: to decrease muscle spasm and pain and  improve mobility Cross friction R MCL Ultrasound: x 8 min to R medial knee 1 MHz, 1.2 w/cm2 cont to decrease inflammation/pain  04/24/23 Therapeutic Exercise: to improve strength and mobility.  Demo, verbal and tactile cues throughout for technique. Bike L1 x 6 min  Seated R/L LAQ for isometric hold x 10 with 10 sec hold 2# Seated step overs for hip flexor strengthening x 15 R/L, 2# Standing Hamstring curls x 25 each 2# Standing hip extension 2 x 10 bil  Retro walking x 5 at counter for safety  Church pews x 10 Seated marching HEP update   04/22/23 Therapeutic Exercise: to improve strength and mobility.  Demo, verbal and tactile cues throughout for technique. Nustep L4 x 6 min Heel/Toe Raises 2 x 10; B con/unilat ecc heel raise x 5 bil- difficult d/t cognition Standing hip abduction 2 x 10 bil Standing hip extension 2 x 10 bil Mini-squats 2 x 10 Seated R/L LAQ x 10  Seated R/L HS curls YTB x 10   Manual Therapy: to decrease muscle spasm, pain and improve mobility.  Grade I R tibia on femur mobs PA and AP in sitting for pain R knee gentle PROM  04/18/2023 Therapeutic Exercise: to improve strength and mobility.  Demo, verbal and tactile cues throughout for technique. Nustep L4 x 6 min  Gait x 450' ( , had to sit after 2 min and rest) At counter for safety with bil UE support: Heel/Toe Raises 2 x 10 Standing hip abduction x 10 r/l Standing hip extension x 10 r/l Mini-squats x 10 Hamstring curls x 10 r/l Tandem stance x 30 sec r/l  04/10/23 EVAL Self Care: Findings, POC  Discussion of different gyms and activities - senior center, aquatic aerobics (not a fan), [employer])   PATIENT EDUCATION:  Education details: see self care Person educated: Patient Education method: Explanation Education comprehension: verbalized understanding  HOME EXERCISE PROGRAM: Access  Code: [email or web address] URL: [email or web address] Date: 05/01/2023 Prepared by:  Harrie Foreman  Exercises - Heel Toe Raises with Counter Support  - 1 x daily - 7 x weekly - 1-2 sets - 10 reps - Standing Hip Abduction with Counter Support  - 1 x daily - 7 x weekly - 1-2 sets - 10 reps - Standing Hip Extension with Counter Support  - 1 x daily - 7 x weekly - 1-2 sets - 10 reps - Mini Squat with Counter Support  - 1 x daily - 7 x weekly - 1-2 sets - 10 reps - Standing Single Leg Stance with Counter Support  - 1 x daily - 7 x weekly - 1-2 sets - 10 reps - Standing Tandem Balance with Counter Support  - 1 x daily - 7 x weekly - 1 sets - 3 reps - 30 sec hold - Church Pew  - 1 x daily - 7 x weekly - 3 sets - 10 reps - Seated March  - 1 x daily - 7 x weekly - 3 sets - 10 reps - Seated March with Same Side Arm Raise   - 1 x daily - 7 x weekly - 3 sets - 10 reps - Supine Bridge  - 1 x daily - 7 x weekly - 3 sets - 10 reps - Active Straight Leg Raise with Quad Set  - 1 x daily - 7 x weekly - 3 sets - 10 reps - Standing Single Leg Stance with Counter Support  - 1 x daily - 7 x weekly - 1 sets - 2 reps - 30 sec hold  ASSESSMENT:  CLINICAL IMPRESSION: Sheila Schmidt reported increased soreness after last session, so decreased weights today.  Good extension with bridges, no difficulty with SLR.  Progressed HEP.  Tender over R medial knee, [place] to decrease pain/inflammation.   Sheila Schmidt continues to demonstrate potential for improvement and would benefit from continued skilled therapy to address impairments.     OBJECTIVE IMPAIRMENTS: Abnormal gait, decreased activity tolerance, decreased endurance, decreased mobility, difficulty walking, decreased ROM, decreased strength, hypomobility, increased edema, increased fascial restrictions, increased muscle spasms, impaired flexibility, and pain.   ACTIVITY LIMITATIONS: carrying, lifting, bending, standing, squatting, stairs, transfers, and locomotion level  PARTICIPATION LIMITATIONS: meal prep, cleaning, laundry, shopping, and  community activity  PERSONAL FACTORS: Time since onset of injury/illness/exacerbation and 3+ comorbidities: Afib, history of L breast cancer, L TKA 2012, osteopenia  are also affecting patient's functional outcome.   REHAB POTENTIAL: Good  CLINICAL DECISION MAKING: Stable/uncomplicated  EVALUATION COMPLEXITY: Low   GOALS: Goals reviewed with patient? Yes  SHORT TERM GOALS: Target date: 04/24/2023   Patient will be independent with initial HEP. Baseline:  Goal status: MET- 04/22/23  LONG TERM GOALS: Target date: 06/05/2023   Patient will be independent with advanced/ongoing HEP to improve outcomes and carryover.  Baseline:  Goal status: IN PROGRESS  2.  Patient will report at least 50% improvement in R knee pain to improve QOL. Baseline:  Goal status: IN PROGRESS  3.  Patient will demonstrate improved R knee extension < 5 deg to allow for normal gait and stair mechanics. Baseline: lacking 5 deg Goal status: IN PROGRESS  4.  Patient will demonstrate improved functional LE strength as demonstrated by 5 x STS <14 sec. Baseline: 16 sec  Goal status: IN PROGRESS  5.  Patient will report 9 points improvement on LEFS to demonstrate improved functional ability. Baseline: 41/80 Goal status: IN PROGRESS  6.  Patient will express confidence with transition to community based exercise program. Baseline:  Goal status: IN PROGRESS   7.  Patient will be able to walk for 10 min to start walking program.  Baseline: unable due to pain Goal status: IN PROGRESS 04/18/23- needed seated break after 2 min.   PLAN:  PT FREQUENCY: 1-2x/week  PT DURATION: 8 weeks  PLANNED INTERVENTIONS: 97110-Therapeutic exercises, 97530- Therapeutic activity, [id number]- Neuromuscular re-education, 97535- Self Care, 16109- Manual therapy, [id number]- Gait training, [phone]- Orthotic Fit/training, 97014- Electrical stimulation (unattended), [id number]- Electrical stimulation (manual), [id number]- Ultrasound, [id number]-  Ionotophoresis 4mg /ml Dexamethasone, Balance training, Stair training, Taping, Dry Needling, Joint mobilization, Joint manipulation, Cryotherapy, and Moist heat  PLAN FOR NEXT SESSION: continue to progress HEP as tolerated.   Has PACEMAKER.    Jena Gauss, PT 05/01/2023, 2:16 PM

## 2023-05-06 ENCOUNTER — Encounter (INDEPENDENT_AMBULATORY_CARE_PROVIDER_SITE_OTHER): Payer: Medicare Other | Admitting: Family Medicine

## 2023-05-06 DIAGNOSIS — U071 COVID-19: Secondary | ICD-10-CM | POA: Diagnosis not present

## 2023-05-06 MED ORDER — MOLNUPIRAVIR EUA 200MG CAPSULE: 4.0000 | ORAL_CAPSULE | Freq: Two times a day (BID) | ORAL | 0 refills | Status: AC

## 2023-05-06 NOTE — Telephone Encounter (Signed)

## 2023-05-15 ENCOUNTER — Encounter: Payer: Medicare Other | Admitting: Physical Therapy

## 2023-05-22 ENCOUNTER — Ambulatory Visit: Payer: Medicare Other | Admitting: Physical Therapy

## 2023-05-28 ENCOUNTER — Ambulatory Visit: Payer: Medicare Other | Attending: Cardiology | Admitting: Cardiology

## 2023-05-28 ENCOUNTER — Encounter: Payer: Self-pay | Admitting: Cardiology

## 2023-05-28 VITALS — BP 108/60 | HR 61 | Ht 62.0 in | Wt 181.0 lb

## 2023-05-28 DIAGNOSIS — I480 Paroxysmal atrial fibrillation: Secondary | ICD-10-CM

## 2023-05-28 DIAGNOSIS — I10 Essential (primary) hypertension: Secondary | ICD-10-CM

## 2023-05-28 DIAGNOSIS — I420 Dilated cardiomyopathy: Secondary | ICD-10-CM

## 2023-05-28 DIAGNOSIS — Z950 Presence of cardiac pacemaker: Secondary | ICD-10-CM

## 2023-05-28 DIAGNOSIS — I447 Left bundle-branch block, unspecified: Secondary | ICD-10-CM | POA: Diagnosis not present

## 2023-05-28 NOTE — Progress Notes (Signed)
Cardiology Office Note:    Date:  05/28/2023   ID:  Sheila Schmidt, DOB 08/27/1935, MRN [id number]  PCP:  Pearline Cables, MD  Cardiologist:  Gypsy Balsam, MD    Referring MD: Pearline Cables, MD   Chief Complaint  Patient presents with   Follow-up    History of Present Illness:    Sheila Schmidt is a 87 y.o. female past medical history significant for paroxysmal atrial fibrillation successfully suppressed with amiodarone, she is anticoagulated, history of cardiomyopathy reduced nonischemic with estimated ejection fraction initially 35%, BiV pacing implanted in June 2023 since that time improvement and normalization.  She also had mastectomy done for breast cancer no chemotherapy required after that she is taking tamoxifen.  Comes today to months for follow-up overall doing fine.  She complained of being weak tired exhausted but otherwise doing fine.  Her daughter who is with her in the room trying to encourage her to be to be more active.  Denies have any chest pain tightness squeezing pressure burning chest no palpitations  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   Dysrhythmia    Afib   Heart murmur    Hx of adenomatous colonic polyps    Hyperlipidemia    Presence of permanent cardiac pacemaker     Past Surgical History:  Procedure Laterality Date   ATRIAL FLUTTER ABLATION     BIV PACEMAKER INSERTION CRT-P N/A 12/04/2021   Procedure: BIV PACEMAKER INSERTION CRT-P;  Surgeon: Lanier Prude, MD;  [facility];  Service: Cardiovascular;  Laterality: N/A;   BREAST BIOPSY Left 07/30/2022   [place] LT BREAST BX W LOC DEV 1ST LESION IMG BX SPEC US GUIDE 07/30/2022 [facility]   BREAST BIOPSY Left 09/10/2022   [place] LT RADIOACTIVE SEED LOC 09/10/2022 [facility]MO[facility]   BREAST BIOPSY Left 09/10/2022   [place] LT RADIOACTIVE  SEED LOC 09/10/2022 [facility]AMMO[facility]   BREAST EXCISIONAL BIOPSY Left 2010   benign   BREAST LUMPECTOMY WITH RADIOACTIVE SEED AND SENTINEL LYMPH NODE BIOPSY Left 09/11/2022   Procedure: LEFT BREAST LUMPECTOMY WITH RADIOACTIVE SEED;  Surgeon: Harriette Bouillon, MD;  Location: [facility];  Service: General;  Laterality: Left;   CARDIOVERSION  05/2020   in [facility]   CARDIOVERSION N/A 06/21/2021   Procedure: CARDIOVERSION;  Surgeon: Sande Rives, MD;  Location: [facility] ENDOSCOPY;  Service: Cardiovascular;  Laterality: N/A;   CARDIOVERSION N/A 03/26/2022   Procedure: CARDIOVERSION;  Surgeon: Lewayne Bunting, MD;  Location: [facility] ENDOSCOPY;  Service: Cardiovascular;  Laterality: N/A;   CHOLECYSTECTOMY     CHOLECYSTECTOMY, LAPAROSCOPIC  '06   [name]   KNEE ARTHROSCOPY     Left '00 [place]/ Right '08   lumpectomy- remote     benign   RADIOACTIVE SEED GUIDED AXILLARY SENTINEL LYMPH NODE Left 09/11/2022   Procedure: RADIOACTIVE SEED GUIDED LEFT SENTINEL LYMPH NODE BIOPSY;  Surgeon: Harriette Bouillon, MD;  Location: [facility] OR;  Service: General;  Laterality: Left;   TOOTH EXTRACTION     TOTAL KNEE ARTHROPLASTY  02/05/2011   left    Current Medications: Current Meds  Medication Sig   acetaminophen (TYLENOL) 500 MG tablet Take 500-1,000 mg by mouth every 8 (eight) hours as needed for moderate pain.   acetaminophen-codeine (TYLENOL #3) 300-30 MG tablet Take 1 tablet  by mouth every 6 (six) hours as needed for moderate pain.   amiodarone (PACERONE) 100 MG tablet Take 1 tablet (100 mg total) by mouth daily.   atorvastatin (LIPITOR) 20 MG tablet Take 1 tablet (20 mg total) by mouth daily.   Calcium Carbonate-Vit D-Min (CALTRATE 600+D PLUS MINERALS) 600-800 MG-UNIT CHEW Chew 1 each by mouth daily.   Cholecalciferol 50 MCG (2000 UT) TABS Take 2,000 Units by mouth daily.   ENTRESTO 24-26 MG TAKE 1 TABLET BY MOUTH TWICE  DAILY   famotidine (PEPCID) 40 MG tablet Take 40 mg by mouth daily as needed for heartburn or  indigestion.   furosemide (LASIX) 20 MG tablet Take 20 mg by mouth every other day.   melatonin 5 MG TABS Take 1.25 mg by mouth at bedtime as needed (sleep).   rivaroxaban (XARELTO) 20 MG TABS tablet Take 1 tablet (20 mg total) by mouth daily with supper.   tamoxifen (NOLVADEX) 20 MG tablet Take 1 tablet (20 mg total) by mouth daily.   venlafaxine XR (EFFEXOR-XR) 37.5 MG 24 hr capsule Take 1 capsule (37.5 mg total) by mouth daily with breakfast.   vitamin B-12 (CYANOCOBALAMIN) 100 MCG tablet Take 100 mcg by mouth daily.     Allergies:   Patient has no known allergies.   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. Married '59, 3 daughters- '61, '64, '73; 2 grandchildren. Exercise - goes to gym 5/wk. ACP - directed to the [email or web address]   Lives with her daughter - goes between daughters home in [place] and her own home here   Social Drivers of Health   Financial Resource Strain: Low Risk  (02/12/2021)   Overall Financial Resource Strain (CARDIA)    Difficulty of Paying Living Expenses: Not hard at all  Food Insecurity: No Food Insecurity (10/02/2022)   Hunger Vital Sign    Worried About Running Out of Food in the Last Year: Never true    Ran Out of Food in the Last Year: Never true  Transportation Needs: No Transportation Needs (10/02/2022)   PRAPARE - Scientist, research (physical sciences) (Medical): No    Lack of Transportation (Non-Medical): No  Physical Activity: Insufficiently Active (10/02/2022)   Exercise Vital Sign    Days of Exercise per Week: 2 days    Minutes of Exercise per Session: 30 min  Stress: Stress Concern Present (10/02/2022)   [employer] of Occupational Health - Occupational Stress Questionnaire    Feeling of Stress : To some extent  Social Connections: Unknown (10/02/2022)   Social Connection and Isolation Panel [NHANES]    Frequency of Communication with Friends and Family: Three times a week    Frequency of Social Gatherings with Friends and Family: Twice a week  Attends Religious Services: Patient declined    Active Member of Clubs or Organizations: Not on file    Attends Banker Meetings: Not on file    Marital Status: Widowed     Family History: The patient's family history includes Diabetes in her mother; Heart attack in her mother; Heart disease in her mother and sister; Liver cancer (age of onset: 23) in her father. There is no history of Breast cancer or Colon cancer. ROS:   Please see the history of present illness.    All 14 point review of systems negative except as described per history of present illness  EKGs/Labs/Other Studies Reviewed:    EKG Interpretation Date/Time:  Tuesday May 28 2023 11:22:36 EST Ventricular Rate:  60 PR Interval:  172 QRS Duration:  132 QT Interval:  482 QTC Calculation: 482 R Axis:   166  Text Interpretation: AV dual-paced rhythm Biventricular pacemaker detected When compared with ECG of 24-Jan-2023 10:54, No significant change was found Confirmed by Gypsy Balsam [phone]) on 05/28/2023 11:49:26 AM    Recent Labs: 01/24/2023: NT-Pro BNP 354; TSH 2.510 04/29/2023: ALT 17; BUN 19; Creatinine 1.04; Hemoglobin 13.1; Platelet Count 175; Potassium 4.2; Sodium 141  Recent Lipid Panel    Component Value Date/Time   CHOL 234 (H) 03/31/2018 1427   TRIG 89.0  03/31/2018 1427   HDL 58.20 03/31/2018 1427   CHOLHDL 4 03/31/2018 1427   VLDL 17.8 03/31/2018 1427   LDLCALC 158 (H) 03/31/2018 1427    Physical Exam:    VS:  BP 108/60 (BP Location: Right Arm, Patient Position: Sitting)   Pulse 61   Ht 5\' 2"  (1.575 m)   Wt 181 lb (82.1 kg)   SpO2 (!) 4%   BMI 33.11 kg/m     Wt Readings from Last 3 Encounters:  05/28/23 181 lb (82.1 kg)  04/29/23 184 lb 3.2 oz (83.6 kg)  04/25/23 186 lb 6.4 oz (84.6 kg)     GEN:  Well nourished, well developed in no acute distress HEENT: Normal NECK: No JVD; No carotid bruits LYMPHATICS: No lymphadenopathy CARDIAC: RRR, no murmurs, no rubs, no gallops RESPIRATORY:  Clear to auscultation without rales, wheezing or rhonchi  ABDOMEN: Soft, non-tender, non-distended MUSCULOSKELETAL:  No edema; No deformity  SKIN: Warm and dry LOWER EXTREMITIES: no swelling NEUROLOGIC:  Alert and oriented x 3 PSYCHIATRIC:  Normal affect   ASSESSMENT:    1. Paroxysmal atrial fibrillation (HCC)   2. Dilated cardiomyopathy (HCC)   3. Essential hypertension, benign   4. Left bundle branch block   5. Pacemaker Abbott device BiV    PLAN:    In order of problems listed above:  Paroxysmal atrial fibrillation did review interrogation of the device the very short episode of atrial fibrillation continue anticoagulation continue small dose of amiodarone. History of dilated cardiomyopathy last echocardiogram showed preserved ejection fraction continue present management. Essential hypertension blood pressure well-controlled continue present management. Left bundle branch block noted status post BiV pacing. Pacemaker device interrogation reviewed normal function continue monitoring.   Medication Adjustments/Labs and Tests Ordered: Current medicines are reviewed at length with the patient today.  Concerns regarding medicines are outlined above.  Orders Placed This Encounter  Procedures   EKG 12-Lead   Medication changes: No  orders of the defined types were placed in this encounter.   Signed, Georgeanna Lea, MD, [facility] 05/28/2023 12:04 PM    [facility]

## 2023-05-28 NOTE — Patient Instructions (Signed)

## 2023-06-04 ENCOUNTER — Ambulatory Visit (INDEPENDENT_AMBULATORY_CARE_PROVIDER_SITE_OTHER): Payer: Medicare Other

## 2023-06-04 DIAGNOSIS — I480 Paroxysmal atrial fibrillation: Secondary | ICD-10-CM

## 2023-06-06 ENCOUNTER — Other Ambulatory Visit: Payer: Self-pay | Admitting: Family Medicine

## 2023-06-06 LAB — CUP PACEART REMOTE DEVICE CHECK
Battery Remaining Longevity: 70 mo
Battery Remaining Percentage: 81 %
Battery Voltage: 2.99 V
Brady Statistic AP VP Percent: 85 %
Brady Statistic AP VS Percent: 1 %
Brady Statistic AS VP Percent: 3.2 %
Brady Statistic AS VS Percent: 11 %
Brady Statistic RA Percent Paced: 86 %
Date Time Interrogation Session: 20241224040009
Implantable Lead Connection Status: 753985
Implantable Lead Connection Status: 753985
Implantable Lead Connection Status: 753985
Implantable Lead Implant Date: 20230626
Implantable Lead Implant Date: 20230626
Implantable Lead Implant Date: 20230626
Implantable Lead Location: 753858
Implantable Lead Location: 753859
Implantable Lead Location: 753860
Implantable Pulse Generator Implant Date: 20230626
Lead Channel Impedance Value: 390 Ohm
Lead Channel Impedance Value: 510 Ohm
Lead Channel Impedance Value: 910 Ohm
Lead Channel Pacing Threshold Amplitude: 0.5 V
Lead Channel Pacing Threshold Amplitude: 0.75 V
Lead Channel Pacing Threshold Amplitude: 1.25 V
Lead Channel Pacing Threshold Pulse Width: 0.5 ms
Lead Channel Pacing Threshold Pulse Width: 0.5 ms
Lead Channel Pacing Threshold Pulse Width: 0.5 ms
Lead Channel Sensing Intrinsic Amplitude: 0.9 mV
Lead Channel Sensing Intrinsic Amplitude: 10.9 mV
Lead Channel Setting Pacing Amplitude: 2.25 V
Lead Channel Setting Pacing Amplitude: 2.5 V
Lead Channel Setting Pacing Amplitude: 2.5 V
Lead Channel Setting Pacing Pulse Width: 0.5 ms
Lead Channel Setting Pacing Pulse Width: 0.5 ms
Lead Channel Setting Sensing Sensitivity: 2 mV
Pulse Gen Model: 3562
Pulse Gen Serial Number: 8071621

## 2023-06-09 ENCOUNTER — Other Ambulatory Visit: Payer: Self-pay | Admitting: Hematology

## 2023-06-18 ENCOUNTER — Other Ambulatory Visit: Payer: Self-pay | Admitting: Family Medicine

## 2023-07-03 ENCOUNTER — Observation Stay (HOSPITAL_BASED_OUTPATIENT_CLINIC_OR_DEPARTMENT_OTHER)
Admission: EM | Admit: 2023-07-03 | Discharge: 2023-07-04 | Disposition: A | Discharge status: Home / Self Care | Payer: Medicare Other | Attending: Student | Admitting: Student

## 2023-07-03 ENCOUNTER — Encounter (HOSPITAL_BASED_OUTPATIENT_CLINIC_OR_DEPARTMENT_OTHER): Payer: Self-pay

## 2023-07-03 ENCOUNTER — Emergency Department (HOSPITAL_BASED_OUTPATIENT_CLINIC_OR_DEPARTMENT_OTHER): Payer: Medicare Other

## 2023-07-03 ENCOUNTER — Other Ambulatory Visit: Payer: Self-pay

## 2023-07-03 DIAGNOSIS — Z79899 Other long term (current) drug therapy: Secondary | ICD-10-CM | POA: Diagnosis not present

## 2023-07-03 DIAGNOSIS — Z20822 Contact with and (suspected) exposure to covid-19: Secondary | ICD-10-CM | POA: Insufficient documentation

## 2023-07-03 DIAGNOSIS — I5032 Chronic diastolic (congestive) heart failure: Secondary | ICD-10-CM | POA: Diagnosis not present

## 2023-07-03 DIAGNOSIS — Z853 Personal history of malignant neoplasm of breast: Secondary | ICD-10-CM | POA: Insufficient documentation

## 2023-07-03 DIAGNOSIS — N179 Acute kidney failure, unspecified: Secondary | ICD-10-CM | POA: Diagnosis not present

## 2023-07-03 DIAGNOSIS — I272 Pulmonary hypertension, unspecified: Secondary | ICD-10-CM | POA: Diagnosis not present

## 2023-07-03 DIAGNOSIS — Z96652 Presence of left artificial knee joint: Secondary | ICD-10-CM | POA: Diagnosis not present

## 2023-07-03 DIAGNOSIS — J9601 Acute respiratory failure with hypoxia: Principal | ICD-10-CM

## 2023-07-03 DIAGNOSIS — F32A Depression, unspecified: Secondary | ICD-10-CM | POA: Insufficient documentation

## 2023-07-03 DIAGNOSIS — I4891 Unspecified atrial fibrillation: Secondary | ICD-10-CM | POA: Diagnosis not present

## 2023-07-03 DIAGNOSIS — J189 Pneumonia, unspecified organism: Secondary | ICD-10-CM | POA: Diagnosis not present

## 2023-07-03 DIAGNOSIS — R0602 Shortness of breath: Principal | ICD-10-CM

## 2023-07-03 DIAGNOSIS — I5030 Unspecified diastolic (congestive) heart failure: Secondary | ICD-10-CM | POA: Diagnosis not present

## 2023-07-03 DIAGNOSIS — Z950 Presence of cardiac pacemaker: Secondary | ICD-10-CM | POA: Diagnosis not present

## 2023-07-03 DIAGNOSIS — I498 Other specified cardiac arrhythmias: Secondary | ICD-10-CM

## 2023-07-03 LAB — CBC WITH DIFFERENTIAL/PLATELET
Abs Immature Granulocytes: 0.03 10*3/uL (ref 0.00–0.07)
Basophils Absolute: 0.1 10*3/uL (ref 0.0–0.1)
Basophils Relative: 1 %
Eosinophils Absolute: 0.1 10*3/uL (ref 0.0–0.5)
Eosinophils Relative: 1 %
HCT: 40.4 % (ref 36.0–46.0)
Hemoglobin: 13.4 g/dL (ref 12.0–15.0)
Immature Granulocytes: 0 %
Lymphocytes Relative: 26 %
Lymphs Abs: 3.7 10*3/uL (ref 0.7–4.0)
MCH: 33.8 pg (ref 26.0–34.0)
MCHC: 33.2 g/dL (ref 30.0–36.0)
MCV: 101.8 fL — ABNORMAL HIGH (ref 80.0–100.0)
Monocytes Absolute: 1.4 10*3/uL — ABNORMAL HIGH (ref 0.1–1.0)
Monocytes Relative: 10 %
Neutro Abs: 8.7 10*3/uL — ABNORMAL HIGH (ref 1.7–7.7)
Neutrophils Relative %: 62 %
Platelets: 167 10*3/uL (ref 150–400)
RBC: 3.97 MIL/uL (ref 3.87–5.11)
RDW: 13.2 % (ref 11.5–15.5)
WBC: 14 10*3/uL — ABNORMAL HIGH (ref 4.0–10.5)
nRBC: 0 % (ref 0.0–0.2)

## 2023-07-03 LAB — BASIC METABOLIC PANEL
Anion gap: 11 (ref 5–15)
BUN: 23 mg/dL (ref 8–23)
CO2: 23 mmol/L (ref 22–32)
Calcium: 9.3 mg/dL (ref 8.9–10.3)
Chloride: 105 mmol/L (ref 98–111)
Creatinine, Ser: 1.34 mg/dL — ABNORMAL HIGH (ref 0.44–1.00)
GFR, Estimated: 39 mL/min — ABNORMAL LOW (ref >60)
Glucose, Bld: 100 mg/dL — ABNORMAL HIGH (ref 70–99)
Potassium: 4.1 mmol/L (ref 3.5–5.1)
Sodium: 139 mmol/L (ref 135–145)

## 2023-07-03 LAB — PHOSPHORUS: Phosphorus: 2.5 mg/dL (ref 2.5–4.6)

## 2023-07-03 LAB — RESP PANEL BY RT-PCR (RSV, FLU A&B, COVID)  RVPGX2
Influenza A by PCR: NEGATIVE
Influenza B by PCR: NEGATIVE
Resp Syncytial Virus by PCR: NEGATIVE
SARS Coronavirus 2 by RT PCR: NEGATIVE

## 2023-07-03 LAB — RESPIRATORY PANEL BY PCR

## 2023-07-03 LAB — TSH: TSH: 3.047 u[IU]/mL (ref 0.350–4.500)

## 2023-07-03 LAB — BRAIN NATRIURETIC PEPTIDE: B Natriuretic Peptide: 314.5 pg/mL — ABNORMAL HIGH (ref 0.0–100.0)

## 2023-07-03 LAB — MAGNESIUM: Magnesium: 2 mg/dL (ref 1.7–2.4)

## 2023-07-03 LAB — TROPONIN I (HIGH SENSITIVITY)
Troponin I (High Sensitivity): 16 ng/L (ref <18)
Troponin I (High Sensitivity): 18 ng/L — ABNORMAL HIGH (ref <18)

## 2023-07-03 LAB — VITAMIN B12: Vitamin B-12: 2632 pg/mL — ABNORMAL HIGH (ref 180–914)

## 2023-07-03 LAB — VITAMIN D 25 HYDROXY (VIT D DEFICIENCY, FRACTURES): Vit D, 25-Hydroxy: 57.91 ng/mL (ref 30–100)

## 2023-07-03 MED ORDER — GUAIFENESIN ER 600 MG PO TB12
600.0000 mg | ORAL_TABLET | Freq: Two times a day (BID) | ORAL | Status: DC
  Administered 2023-07-03 – 2023-07-04 (×2): 600 mg via ORAL
  Filled 2023-07-03 (×2): qty 1

## 2023-07-03 MED ORDER — SODIUM CHLORIDE 0.9% FLUSH
3.0000 mL | Freq: Two times a day (BID) | INTRAVENOUS | Status: DC
  Administered 2023-07-04: 3 mL via INTRAVENOUS

## 2023-07-03 MED ORDER — SODIUM CHLORIDE 0.9 % IV SOLN
500.0000 mg | Freq: Once | INTRAVENOUS | Status: AC
  Administered 2023-07-03: 500 mg via INTRAVENOUS
  Filled 2023-07-03: qty 5

## 2023-07-03 MED ORDER — METOPROLOL TARTRATE 5 MG/5ML IV SOLN
5.0000 mg | Freq: Once | INTRAVENOUS | Status: AC
Start: 2023-07-03 — End: 2023-07-03
  Administered 2023-07-03: 5 mg via INTRAVENOUS
  Filled 2023-07-03: qty 5

## 2023-07-03 MED ORDER — METOPROLOL TARTRATE 5 MG/5ML IV SOLN: 5.0000 mg | Freq: Four times a day (QID) | INTRAVENOUS | Status: DC | PRN

## 2023-07-03 MED ORDER — POLYETHYLENE GLYCOL 3350 17 G PO PACK: 17.0000 g | PACK | Freq: Every day | ORAL | Status: DC | PRN

## 2023-07-03 MED ORDER — CEFTRIAXONE SODIUM 1 G IJ SOLR
1.0000 g | Freq: Once | INTRAMUSCULAR | Status: AC
  Administered 2023-07-03: 1 g via INTRAVENOUS
  Filled 2023-07-03: qty 10

## 2023-07-03 MED ORDER — BISACODYL 5 MG PO TBEC: 10.0000 mg | DELAYED_RELEASE_TABLET | Freq: Every day | ORAL | Status: DC | PRN

## 2023-07-03 MED ORDER — SODIUM CHLORIDE 0.9% FLUSH
3.0000 mL | Freq: Two times a day (BID) | INTRAVENOUS | Status: DC
  Administered 2023-07-03 – 2023-07-04 (×2): 3 mL via INTRAVENOUS

## 2023-07-03 MED ORDER — SACUBITRIL-VALSARTAN 24-26 MG PO TABS
1.0000 | ORAL_TABLET | Freq: Two times a day (BID) | ORAL | Status: DC
Start: 2023-07-03 — End: 2023-07-04
  Administered 2023-07-03 – 2023-07-04 (×2): 1 via ORAL
  Filled 2023-07-03 (×2): qty 1

## 2023-07-03 MED ORDER — VENLAFAXINE HCL ER 37.5 MG PO CP24
37.5000 mg | ORAL_CAPSULE | Freq: Every day | ORAL | Status: DC
  Administered 2023-07-04: 37.5 mg via ORAL
  Filled 2023-07-03: qty 1

## 2023-07-03 MED ORDER — FAMOTIDINE 20 MG PO TABS: 40.0000 mg | ORAL_TABLET | Freq: Every day | ORAL | Status: DC | PRN

## 2023-07-03 MED ORDER — TAMOXIFEN CITRATE 10 MG PO TABS
20.0000 mg | ORAL_TABLET | Freq: Every day | ORAL | Status: DC
  Administered 2023-07-04: 20 mg via ORAL
  Filled 2023-07-03: qty 2

## 2023-07-03 MED ORDER — ACETAMINOPHEN 650 MG RE SUPP: 650.0000 mg | Freq: Four times a day (QID) | RECTAL | Status: DC | PRN

## 2023-07-03 MED ORDER — SODIUM CHLORIDE 0.9 % IV BOLUS
1000.0000 mL | Freq: Once | INTRAVENOUS | Status: AC
  Administered 2023-07-03: 1000 mL via INTRAVENOUS

## 2023-07-03 MED ORDER — ACETAMINOPHEN 325 MG PO TABS: 650.0000 mg | ORAL_TABLET | Freq: Four times a day (QID) | ORAL | Status: DC | PRN

## 2023-07-03 MED ORDER — ATORVASTATIN CALCIUM 10 MG PO TABS
20.0000 mg | ORAL_TABLET | Freq: Every day | ORAL | Status: DC
  Administered 2023-07-03: 20 mg via ORAL
  Filled 2023-07-03: qty 2

## 2023-07-03 MED ORDER — LACTATED RINGERS IV BOLUS: 1000.0000 mL | Freq: Once | INTRAVENOUS | Status: DC

## 2023-07-03 MED ORDER — IOHEXOL 350 MG/ML SOLN
100.0000 mL | Freq: Once | INTRAVENOUS | Status: AC | PRN
  Administered 2023-07-03: 60 mL via INTRAVENOUS

## 2023-07-03 MED ORDER — IPRATROPIUM-ALBUTEROL 0.5-2.5 (3) MG/3ML IN SOLN
3.0000 mL | Freq: Four times a day (QID) | RESPIRATORY_TRACT | Status: DC
  Administered 2023-07-03 – 2023-07-04 (×2): 3 mL via RESPIRATORY_TRACT
  Filled 2023-07-03 (×3): qty 3

## 2023-07-03 MED ORDER — AMIODARONE HCL 200 MG PO TABS
100.0000 mg | ORAL_TABLET | Freq: Every day | ORAL | Status: DC
  Administered 2023-07-04: 100 mg via ORAL
  Filled 2023-07-03: qty 1

## 2023-07-03 MED ORDER — SODIUM CHLORIDE 0.9 % IV SOLN
500.0000 mg | INTRAVENOUS | Status: DC
  Administered 2023-07-04: 500 mg via INTRAVENOUS
  Filled 2023-07-03: qty 5

## 2023-07-03 MED ORDER — SODIUM CHLORIDE 0.9% FLUSH: 3.0000 mL | INTRAVENOUS | Status: DC | PRN

## 2023-07-03 MED ORDER — RIVAROXABAN 15 MG PO TABS
15.0000 mg | ORAL_TABLET | Freq: Every day | ORAL | Status: DC
  Administered 2023-07-03: 15 mg via ORAL
  Filled 2023-07-03 (×2): qty 1

## 2023-07-03 MED ORDER — SODIUM CHLORIDE 0.9 % IV SOLN: 250.0000 mL | INTRAVENOUS | Status: DC | PRN

## 2023-07-03 MED ORDER — SODIUM CHLORIDE 0.9 % IV SOLN
1.0000 g | INTRAVENOUS | Status: DC
  Administered 2023-07-04: 1 g via INTRAVENOUS
  Filled 2023-07-03: qty 10

## 2023-07-03 MED ORDER — MELATONIN 3 MG PO TABS: 3.0000 mg | ORAL_TABLET | Freq: Every evening | ORAL | Status: DC | PRN

## 2023-07-03 NOTE — ED Notes (Addendum)
RRT attempting USIV, unable to complete bedside assessment at this time

## 2023-07-03 NOTE — ED Notes (Signed)
Extra gold top and Lt blue sent to lab for hold

## 2023-07-03 NOTE — H&P (Signed)
[facility] History and Physical   Patient: Sheila Schmidt ZOX:[id number]   PCP: Pearline Cables, MD DOB: 27-Jul-1935   DOA: 07/03/2023   DOS: 07/03/2023   DOS: the patient was seen and examined on 07/03/2023  Patient coming from: The patient is coming from Home  Chief Complaint: SOB, blurry vision  HPI: Sheila Schmidt is a 88 y.o. female with PMH of A-fib, s/p pacemaker, dCHF, depression, history of breast cancer, as reviewed from EMR, presented [facility] due to shortness of breath and blurry vision started early morning 3:30 AM.  As per patient daughter she was not feeling well and heart rate was in 40s, O2 saturation 68% on room air at home and [facility].  Order patient was found to have hypoxic respiratory failure, workup showed pneumonitis, no PE, heart rate was fluctuating, intermittent bigeminy, A-fib with RVR.  Interrogation of pacemaker was done which showed tachycardia.  Patient was transferred to [facility] for further workup and management.  ED Course: Vital signs afebrile, HR 50-122, BP 126/63, RR 14- 22, currently saturating well on room air BMP creatinine 1.34 BNP 314, slightly elevated Troponin 16--18 WBC 14 Negative COVID, flu and RSV CTA chest negative for PE, she was possible pneumonitis   Review of Systems: as mentioned in the history of present illness.  All other systems reviewed and are negative.  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   Dysrhythmia    Afib   Heart murmur    Hx of adenomatous colonic polyps    Hyperlipidemia    Presence of permanent cardiac pacemaker    Past Surgical History:  Procedure Laterality Date   ATRIAL FLUTTER ABLATION     BIV PACEMAKER INSERTION CRT-P N/A 12/04/2021   Procedure: BIV PACEMAKER INSERTION CRT-P;  Surgeon: Lanier Prude, MD;  Location: [facility]  INVASIVE CV LAB;  Service: Cardiovascular;  Laterality: N/A;   BREAST BIOPSY Left 07/30/2022   [place] LT BREAST BX W LOC DEV 1ST LESION IMG BX SPEC US GUIDE 07/30/2022 [facility]   BREAST BIOPSY Left 09/10/2022   [place] LT RADIOACTIVE SEED LOC 09/10/2022 [facility]AMMOGR[facility]   BREAST BIOPSY Left 09/10/2022   [place] LT RADIOACTIVE SEED LOC 09/10/2022 [facility]AMMOGR[facility]   BREAST EXCISIONAL BIOPSY Left 2010   benign   BREAST LUMPECTOMY WITH RADIOACTIVE SEED AND SENTINEL LYMPH NODE BIOPSY Left 09/11/2022   Procedure: LEFT BREAST LUMPECTOMY WITH RADIOACTIVE SEED;  Surgeon: Harriette Bouillon, MD;  Location: [facility] OR;  Service: General;  Laterality: Left;   CARDIOVERSION  05/2020   in [facility]   CARDIOVERSION N/A 06/21/2021   Procedure: CARDIOVERSION;  Surgeon: Sande Rives, MD;  Location: [facility] ENDOSCOPY;  Service: Cardiovascular;  Laterality: N/A;   CARDIOVERSION N/A 03/26/2022   Procedure: CARDIOVERSION;  Surgeon: Lewayne Bunting, MD;  Location: [facility] ENDOSCOPY;  Service: Cardiovascular;  Laterality: N/A;   CHOLECYSTECTOMY     CHOLECYSTECTOMY, LAPAROSCOPIC  '06   [name]   KNEE ARTHROSCOPY     Left '00 [place]/ Right '08   lumpectomy- remote     benign   RADIOACTIVE SEED GUIDED AXILLARY SENTINEL LYMPH NODE Left 09/11/2022   Procedure: RADIOACTIVE SEED GUIDED LEFT SENTINEL LYMPH NODE BIOPSY;  Surgeon: Harriette Bouillon, MD;  Location: [facility] OR;  Service: General;  Laterality: Left;  TOOTH EXTRACTION     TOTAL KNEE ARTHROPLASTY  02/05/2011   left   Social History:  reports that she has never smoked. She has never been exposed to tobacco smoke. She has never used smokeless tobacco. She reports current alcohol use. She reports that she does not use drugs.  No Known Allergies   Family history reviewed and not pertinent Family History  Problem Relation Age of Onset   Diabetes Mother        Deceased in 40s   Heart disease Mother        cad/MI-fatal   Heart attack Mother    Liver cancer Father 54   Heart disease  Sister    Breast cancer Neg Hx    Colon cancer Neg Hx      Prior to Admission medications   Medication Sig Start Date End Date Taking? Authorizing Provider  acetaminophen (TYLENOL) 500 MG tablet Take 500-1,000 mg by mouth as needed for moderate pain (pain score 4-6).   Yes [provider]  amiodarone (PACERONE) 100 MG tablet Take 1 tablet (100 mg total) by mouth daily. 04/04/23  Yes Flossie Dibble, NP  atorvastatin (LIPITOR) 20 MG tablet Take 1 tablet (20 mg total) by mouth daily. Patient taking differently: Take 20 mg by mouth at bedtime. 04/03/23  Yes Copland, Gwenlyn Found, MD  calcium carbonate (TUMS - DOSED IN MG ELEMENTAL CALCIUM) 500 MG chewable tablet Chew 1 tablet by mouth as needed for indigestion or heartburn.   Yes [provider]  Calcium Carbonate-Vit D-Min (CALTRATE 600+D PLUS MINERALS) 600-800 MG-UNIT CHEW Chew 1 each by mouth daily.   Yes [provider]  Cholecalciferol 50 MCG (2000 UT) TABS Take 2,000 Units by mouth daily.   Yes [provider]  famotidine (PEPCID) 40 MG tablet Take 40 mg by mouth daily as needed for heartburn or indigestion. 01/02/21  Yes [provider]  furosemide (LASIX) 20 MG tablet Take 20 mg by mouth as needed for fluid or edema.   Yes [provider]  melatonin 5 MG TABS Take 1.25 mg by mouth at bedtime as needed (sleep).   Yes [provider]  rivaroxaban (XARELTO) 20 MG TABS tablet Take 1 tablet (20 mg total) by mouth daily with supper. 04/03/23  Yes Copland, Gwenlyn Found, MD  sacubitril-valsartan (ENTRESTO) 24-26 MG TAKE 1 TABLET BY MOUTH TWICE  DAILY 06/07/23  Yes Copland, Gwenlyn Found, MD  tamoxifen (NOLVADEX) 20 MG tablet TAKE 1 TABLET BY MOUTH DAILY 06/09/23  Yes Pollyann Samples, NP  venlafaxine XR (EFFEXOR-XR) 37.5 MG 24 hr capsule Take 1 capsule (37.5 mg total) by mouth daily with breakfast. 06/18/23  Yes Copland, Gwenlyn Found, MD  vitamin B-12 (CYANOCOBALAMIN) 100 MCG tablet Take 100 mcg by  mouth daily.   Yes [provider]  acetaminophen-codeine (TYLENOL #3) 300-30 MG tablet Take 1 tablet by mouth every 6 (six) hours as needed for moderate pain. Patient not taking: Reported on 07/03/2023 05/22/21   Pearline Cables, MD    Physical Exam: Vitals:   07/03/23 1500 07/03/23 1530 07/03/23 1700 07/03/23 1711  BP:    111/77  Pulse: (!) 117 (!) 118 96   Resp: 17 17  18   Temp:    98.6 F (37 C)  TempSrc:    Oral  SpO2: 99% 96%  98%  Weight:      Height:        General: alert and oriented to time, place, and person. Appear in mild  distress, affect depressed Eyes: PERRLA, Conjunctiva normal ENT: Oral Mucosa Clear, moist  Neck: no JVD, no Abnormal Mass Or lumps Cardiovascular: S1 and S2 Present, no Murmur, peripheral pulses symmetrical Respiratory: good respiratory effort, Bilateral Air entry equal and Decreased, no signs of accessory muscle use, .Clear to Auscultation, no Crackles, occasional wheezes Abdomen: Bowel Sound present, Soft and no tenderness, no hernia Skin: no rashes  Extremities: no Pedal edema, no calf tenderness Neurologic: without any new focal findings Gait not checked due to patient safety concerns  Data Reviewed: I have personally reviewed and interpreted labs, imaging as discussed below.  CBC: Recent Labs  Lab 07/03/23 0613  WBC 14.0*  NEUTROABS 8.7*  HGB 13.4  HCT 40.4  MCV 101.8*  PLT 167   Basic Metabolic Panel: Recent Labs  Lab 07/03/23 0613  NA 139  K 4.1  CL 105  CO2 23  GLUCOSE 100*  BUN 23  CREATININE 1.34*  CALCIUM 9.3   GFR: Estimated Creatinine Clearance: 30.5 mL/min (A) (by C-G formula based on SCr of 1.34 mg/dL (H)). Liver Function Tests: No results for input(s): "AST", "ALT", "ALKPHOS", "BILITOT", "PROT", "ALBUMIN" in the last 168 hours. No results for input(s): "LIPASE", "AMYLASE" in the last 168 hours. No results for input(s): "AMMONIA" in the last 168 hours. Coagulation Profile: No results for  input(s): "INR", "PROTIME" in the last 168 hours. Cardiac Enzymes: No results for input(s): "CKTOTAL", "CKMB", "CKMBINDEX", "TROPONINI" in the last 168 hours. BNP (last 3 results) Recent Labs    12/06/22 1043 01/24/23 1133  PROBNP 343.0* 354   HbA1C: No results for input(s): "HGBA1C" in the last 72 hours. CBG: No results for input(s): "GLUCAP" in the last 168 hours. Lipid Profile: No results for input(s): "CHOL", "HDL", "LDLCALC", "TRIG", "CHOLHDL", "LDLDIRECT" in the last 72 hours. Thyroid Function Tests: No results for input(s): "TSH", "T4TOTAL", "FREET4", "T3FREE", "THYROIDAB" in the last 72 hours. Anemia Panel: No results for input(s): "VITAMINB12", "FOLATE", "FERRITIN", "TIBC", "IRON", "RETICCTPCT" in the last 72 hours. Urine analysis:    Component Value Date/Time   COLORURINE AMBER (A) 11/27/2022 1104   APPEARANCEUR CLEAR 11/27/2022 1104   LABSPEC 1.021 11/27/2022 1104   PHURINE 6.0 11/27/2022 1104   GLUCOSEU NEGATIVE 11/27/2022 1104   GLUCOSEU NEGATIVE 01/26/2010 0833   HGBUR SMALL (A) 11/27/2022 1104   BILIRUBINUR NEGATIVE 11/27/2022 1104   BILIRUBINUR neg 10/16/2016 0956   KETONESUR 5 (A) 11/27/2022 1104   PROTEINUR 30 (A) 11/27/2022 1104   UROBILINOGEN negative (A) 10/16/2016 0956   UROBILINOGEN 0.2 09/25/2013 0920   NITRITE NEGATIVE 11/27/2022 1104   LEUKOCYTESUR TRACE (A) 11/27/2022 1104    Radiological Exams on Admission: CT Angio Chest PE W and/or Wo Contrast Result Date: 07/03/2023 CLINICAL DATA:  Syncope/presyncope, cerebrovascular cause suspected. Shortness of breath. Dizziness. EXAM: CT ANGIOGRAPHY CHEST WITH CONTRAST TECHNIQUE: Multidetector CT imaging of the chest was performed using the standard protocol during bolus administration of intravenous contrast. Multiplanar CT image reconstructions and MIPs were obtained to evaluate the vascular anatomy. RADIATION DOSE REDUCTION: This exam was performed according to the departmental dose-optimization program  which includes automated exposure control, adjustment of the mA and/or kV according to patient size and/or use of iterative reconstruction technique. CONTRAST:  60mL OMNIPAQUE IOHEXOL 350 MG/ML SOLN COMPARISON:  CT scan chest from 12/07/2005. FINDINGS: Cardiovascular: No evidence of embolism to the proximal subsegmental pulmonary artery level. There is dilation of the main pulmonary trunk measuring up to 3.5 cm, which is nonspecific but can be seen with pulmonary artery  hypertension. Mild cardiomegaly. No pericardial effusion. No aortic aneurysm. There are coronary artery calcifications, in keeping with coronary artery disease. There are also moderate peripheral atherosclerotic vascular calcifications of thoracic aorta and its major branches. There is a left sided 3-lead pacemaker. Mediastinum/Nodes: Visualized thyroid gland appears grossly unremarkable. No solid / cystic mediastinal masses. The esophagus is nondistended precluding optimal assessment. No axillary, mediastinal or hilar lymphadenopathy by size criteria. Lungs/Pleura: The central tracheo-bronchial tree is patent. There are patchy areas of linear, plate-like atelectasis and/or scarring throughout bilateral lungs. There also several patchy ground-glass opacities in the left upper lobe and middle lobe. No mass or consolidation. No pleural effusion or pneumothorax. No suspicious lung nodules. Upper Abdomen: Surgically absent gallbladder. There is a partially imaged cystic structure arising from the right kidney upper pole. There are scattered diverticula in the splenic flexure of colon without diverticulitis. There is a small sliding hiatal hernia. Remaining visualized upper abdominal viscera within normal limits. Musculoskeletal: The visualized soft tissues of the chest wall are grossly unremarkable. No suspicious osseous lesions. There are mild multilevel degenerative changes in the visualized spine. Review of the MIP images confirms the above findings.  IMPRESSION: 1. No embolism to the proximal subsegmental pulmonary artery level. 2. There are several patchy ground-glass opacities in the left upper lobe and middle lobe. These are nonspecific and differential diagnosis includes but not limited to are infection, chronic interstitial disease, hypersensitivity pneumonitis, etc. 3. Multiple other nonacute observations, as described above. Aortic Atherosclerosis (ICD10-I70.0). Electronically Signed   By: Jules Schick M.D.   On: 07/03/2023 08:57   DG Chest Portable 1 View Result Date: 07/03/2023 CLINICAL DATA:  88 year old female with shortness of breath and dizziness. History of treated breast cancer. EXAM: PORTABLE CHEST 1 VIEW COMPARISON:  Chest radiographs 09/04/2022 and earlier. FINDINGS: Portable AP semi upright view at 0617 hours. Chronic left chest 3 lead pacer or ICD. Stable cardiomegaly and mediastinal contours. Calcified aortic atherosclerosis. Mildly lower lung volumes. Visualized tracheal air column is within normal limits. No pneumothorax. No convincing pulmonary edema, pleural effusion, or confluent lung opacity. Paucity of bowel gas. Stable visualized osseous structures. IMPRESSION: Low lung volumes. Stable cardiomegaly and no acute cardiopulmonary abnormality. Electronically Signed   By: Odessa Fleming M.D.   On: 07/03/2023 06:27   EKG: Independently reviewed. atrial fibrillation, rate 113. Echocardiogram: 09/06/2022 LVEF 55 to 60%, grade 2 diastolic dysfunction, severe pulmonary hypertension, moderate bilateral atrial enlargement  I reviewed all nursing notes, pharmacy notes, vitals, pertinent old records.  Assessment/Plan Principal Problem:   Acute respiratory failure with hypoxia (HCC) Active Problems:   Acute hypoxic respiratory failure (HCC)   # Acute respiratory failure with hypoxia  Continue supplemental O2 admission as needed Currently saturating well on room air at rest Check ambulatory O2 sats in a.m.   # Community-acquired  pneumonia CTA chest ruled out PE S/p ceftriaxone azithromycin given at [facility] ED Continued ceftriaxone and azithromycin IV daily Started Mucinex extremity gram p.o. twice daily DuoNeb every 6 hourly as needed Trend WBC count Check procalcitonin tomorrow a.m. Check RVP panel for other viral infections  # A-fib with RVR Patient has blurry vision could be due to side effect of amiodarone as she had grapefruit juice yesterday which can increase toxicity of amiodarone Skip the dose of amiodarone today Resume amiodarone tomorrow a.m. 100 mg p.o. daily Continue Xarelto S/p pacemaker, interrogation was done at [facility] as per ED physician which showed tachycardia Continue to monitor on telemetry Consider cardiology consult if remains in A-fib  with RVR  # Diastolic CHF and severe pulmonary hypertension Resumed Entresto home dose BNP 314 slightly elevated Clinically euvolemic  # AKI, baseline creatinine 0.94 Creatinine 1.34 on admission Continue oral hydration Monitor renal functions, avoid nephrotoxic medications  # History of breast cancer continue tamoxifen # Depression continue Effexor   Nutrition: Cardiac diet DVT Prophylaxis: Xarelto  Advance goals of care discussion: Full code   Consults: None  Family Communication: family was present at bedside, at the time of interview.  Opportunity was given to ask question and all questions were answered satisfactorily.  Disposition: Admitted as inpatient, telemetry unit. Likely to be discharged Home with HH vs SNF, in 1-2 days when stable.  I have discussed plan of care as described above with RN and patient/family.  Severity of Illness: The appropriate patient status for this patient is INPATIENT. Inpatient status is judged to be reasonable and necessary in order to provide the required intensity of service to ensure the patient's safety. The patient's presenting symptoms, physical exam findings, and initial radiographic and  laboratory data in the context of their chronic comorbidities is felt to place them at high risk for further clinical deterioration. Furthermore, it is not anticipated that the patient will be medically stable for discharge from the hospital within 2 midnights of admission.   * I certify that at the point of admission it is my clinical judgment that the patient will require inpatient hospital care spanning beyond 2 midnights from the point of admission due to high intensity of service, high risk for further deterioration and high frequency of surveillance required.*   Author: Gillis Santa, MD [facility] 07/03/2023 5:51 PM   To reach On-call, see care teams to locate the attending and reach out to them via [email or web address]. If 7PM-7AM, please contact night-coverage If you still have difficulty reaching the attending provider, please page the [facility] (Director on Call) for [facility] on amion for assistance.

## 2023-07-03 NOTE — ED Notes (Signed)
Unable to give ordered medications; entresto and tamoxifen, EDP notified

## 2023-07-03 NOTE — ED Provider Notes (Addendum)
  Physical Exam  BP 126/63 (BP Location: Right Arm)   Pulse 89   Temp (!) 97.5 F (36.4 C) (Oral)   Resp (!) 22   Ht 5\' 2"  (1.575 m)   Wt 82 kg   SpO2 97%   BMI 33.06 kg/m   Physical Exam  Procedures  .Ultrasound ED Peripheral IV (Provider)  Date/Time: 07/03/2023 7:59 AM  Performed by: Ernie Avena, MD Authorized by: Ernie Avena, MD   Procedure details:    Indications: multiple failed IV attempts     Skin Prep: chlorhexidine gluconate     Location:  Right AC   Angiocath:  20 G   Bedside Ultrasound Guided: Yes     Images: not archived     Patient tolerated procedure without complications: Yes     Dressing applied: Yes   .Critical Care  Performed by: Ernie Avena, MD Authorized by: Ernie Avena, MD   Critical care provider statement:    Critical care time (minutes):  30   Critical care was necessary to treat or prevent imminent or life-threatening deterioration of the following conditions:  Respiratory failure   Critical care was time spent personally by me on the following activities:  Development of treatment plan with patient or surrogate, discussions with consultants, evaluation of patient's response to treatment, examination of patient, ordering and review of laboratory studies, ordering and review of radiographic studies, ordering and performing treatments and interventions, pulse oximetry, re-evaluation of patient's condition and review of old charts   Care discussed with: admitting provider     ED Course / MDM    Medical Decision Making Amount and/or Complexity of Data Reviewed Labs: ordered. Radiology: ordered.  Risk Prescription drug management. Decision regarding hospitalization.   43F, presenting with afib, has pacemaker, questionable HR of 43 at home, satting 73% at home. Currently HR 89, sats 97% on room air. Pt has a hx of CHF. Workup pending, labs and CTA.   Pt required USIV placement due to multiple, failed attempts by nursing. Her Left  arm is restricted due to a hx of left sided breast cancer, lumpectomy and axillary lymph node removal.    CTA PE: IMPRESSION:  1. No embolism to the proximal subsegmental pulmonary artery level.  2. There are several patchy ground-glass opacities in the left upper  lobe and middle lobe. These are nonspecific and differential  diagnosis includes but not limited to are infection, chronic  interstitial disease, hypersensitivity pneumonitis, etc.  3. Multiple other nonacute observations, as described above.    Aortic Atherosclerosis (ICD10-I70.0).   Pt with a leukocytosis to 14, short of breath, lightheaded. Pt pacemaker interrogated, full report pending. Cr mildly elevated to 1.34 from a baseline of 0.7-0.9. The patient is on Amiodarone and Entresto. Last EF normalized but some residual diastolic dysfunction. The patient has had a subacute cough that has worsened over the past week. She presents with worsening SOB. Considered CAP vs pneumonitis in the setting of Amiodarone use.  She has a mild AKI. 1L NaCl bolus ordered, pt given CAP coverage ABX. Recommended admission for observation in the setting of patient symptoms. On attempts at ambulation, the patient desaturated to 84% on room air. Hospitalist medicine consulted for admission, Dr. Katrinka Blazing accepting.      Ernie Avena, MD 07/03/23 1610    Ernie Avena, MD 07/03/23 [id number]    Ernie Avena, MD 08/06/23 [phone]

## 2023-07-03 NOTE — ED Notes (Signed)
Attempted to ambulate pt w pulse ox. Upon standing, pt de-satted to 84 and further de-satted to 80 while getting back into bed. Pt O2 sat returned to 95 upon sitting down in bed. EDP notified

## 2023-07-03 NOTE — ED Notes (Signed)
EDP notified of lower pulse rate. EDP stated he is aware of difference between pulse rate and ECG rate for pt. EDP at bedside

## 2023-07-03 NOTE — ED Triage Notes (Signed)
At 0330 pt reported to her daughter she did not feel good. O2 sat 77% per daughter. Pt has pacemaker and on blood thinners and antiarrhythmic. Pt also reported she was dizzy and had SOB.  Pt with hx of a flutter. Denies any sx while in triage. No CP.

## 2023-07-03 NOTE — ED Provider Notes (Addendum)
[facility] Provider Note   CSN: [id number] Arrival date & time: 07/03/23  0557     History  Chief Complaint  Patient presents with   Shortness of Breath    Sheila Schmidt is a 88 y.o. female.  The history is provided by the patient.  Shortness of Breath Severity:  Moderate Onset quality:  Sudden Timing:  Constant Progression:  Unchanged Chronicity:  New Context: not occupational exposure   Relieved by:  Nothing Worsened by:  Nothing Ineffective treatments:  None tried Associated symptoms: no fever and no syncope   Risk factors: no recent surgery   Patient with Afib and CHF on entresto and amiodarone presents with Lightheadedness and SOB and reported o2 saturation on home pulse of 77%.      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 Authorizing Provider  acetaminophen (TYLENOL) 500 MG tablet Take 500-1,000 mg by mouth every 8 (eight) hours as needed for moderate pain.    [provider]  acetaminophen-codeine (TYLENOL #3) 300-30 MG tablet Take 1 tablet by mouth every 6 (six) hours as needed for moderate pain. 05/22/21   Copland, Gwenlyn Found, MD  amiodarone (PACERONE) 100 MG tablet Take 1 tablet (100 mg total) by mouth daily. 04/04/23   Flossie Dibble, NP  atorvastatin (LIPITOR) 20 MG tablet Take 1 tablet (20 mg total) by mouth daily. 04/03/23   Copland, Gwenlyn Found, MD  Calcium Carbonate-Vit D-Min (CALTRATE 600+D PLUS MINERALS) 600-800 MG-UNIT CHEW Chew 1 each by mouth daily.    [provider]  Cholecalciferol 50 MCG (2000 UT) TABS Take  2,000 Units by mouth daily.    [provider]  famotidine (PEPCID) 40 MG tablet Take 40 mg by mouth daily as needed for heartburn or indigestion. 01/02/21   [provider]  furosemide (LASIX) 20 MG tablet Take 20 mg by mouth every other day.    [provider]  melatonin 5 MG TABS Take 1.25 mg by mouth at bedtime as needed (sleep).    [provider]  rivaroxaban (XARELTO) 20 MG TABS tablet Take 1 tablet (20 mg total) by mouth daily with supper. 04/03/23   Copland, Gwenlyn Found, MD  sacubitril-valsartan (ENTRESTO) 24-26 MG TAKE 1 TABLET BY MOUTH TWICE  DAILY 06/07/23   Copland, Gwenlyn Found, MD  tamoxifen (NOLVADEX) 20 MG tablet TAKE 1 TABLET BY MOUTH DAILY 06/09/23   Pollyann Samples, NP  venlafaxine XR (EFFEXOR-XR) 37.5 MG 24 hr capsule Take 1 capsule (37.5 mg total) by mouth daily with breakfast. 06/18/23   Copland, Gwenlyn Found, MD  vitamin B-12 (CYANOCOBALAMIN) 100 MCG tablet Take 100 mcg by mouth daily.    [provider]      Allergies    Patient has no known allergies.    Review of Systems   Review of Systems  Constitutional:  Negative for fever.  HENT:  Negative for facial swelling.   Eyes:  Negative for redness.  Respiratory:  Positive for shortness of  breath. Negative for stridor.   Cardiovascular:  Negative for syncope.  All other systems reviewed and are negative.   Physical Exam Updated Vital Signs BP 126/63 (BP Location: Right Arm)   Pulse 89   Temp (!) 97.5 F (36.4 C) (Oral)   Resp (!) 22   Ht 5\' 2"  (1.575 m)   Wt 82 kg   SpO2 97%   BMI 33.06 kg/m  Physical Exam Vitals and nursing note reviewed. Exam conducted with a chaperone present.  Constitutional:      General: She is not in acute distress.    Appearance: She is well-developed.  HENT:     Head: Normocephalic and atraumatic.     Nose: Nose normal.  Eyes:     Pupils: Pupils are equal, round, and reactive to light.  Cardiovascular:     Rate and Rhythm: Normal rate.  Rhythm irregular.     Pulses: Normal pulses.     Heart sounds: Normal heart sounds.  Pulmonary:     Effort: Pulmonary effort is normal. No respiratory distress.     Breath sounds: Decreased breath sounds present.  Abdominal:     General: Bowel sounds are normal. There is no distension.     Palpations: Abdomen is soft.     Tenderness: There is no abdominal tenderness. There is no guarding or rebound.  Musculoskeletal:        General: Normal range of motion.     Cervical back: Neck supple.  Skin:    General: Skin is warm and dry.     Capillary Refill: Capillary refill takes less than 2 seconds.     Findings: No erythema or rash.  Neurological:     General: No focal deficit present.     Deep Tendon Reflexes: Reflexes normal.  Psychiatric:        Behavior: Behavior normal.     ED Results / Procedures / Treatments   Labs (all labs ordered are listed, but only abnormal results are displayed) Results for orders placed or performed during the hospital encounter of 07/03/23  CBC with Differential   Collection Time: 07/03/23  6:13 AM  Result Value Ref Range   WBC 14.0 (H) 4.0 - 10.5 K/uL   RBC 3.97 3.87 - 5.11 MIL/uL   Hemoglobin 13.4 12.0 - 15.0 g/dL   HCT 09.8 11.9 - 14.7 %   MCV 101.8 (H) 80.0 - 100.0 fL   MCH 33.8 26.0 - 34.0 pg   MCHC 33.2 30.0 - 36.0 g/dL   RDW 82.9 56.2 - 13.0 %   Platelets 167 150 - 400 K/uL   nRBC 0.0 0.0 - 0.2 %   Neutrophils Relative % 62 %   Neutro Abs 8.7 (H) 1.7 - 7.7 K/uL   Lymphocytes Relative 26 %   Lymphs Abs 3.7 0.7 - 4.0 K/uL   Monocytes Relative 10 %   Monocytes Absolute 1.4 (H) 0.1 - 1.0 K/uL   Eosinophils Relative 1 %   Eosinophils Absolute 0.1 0.0 - 0.5 K/uL   Basophils Relative 1 %   Basophils Absolute 0.1 0.0 - 0.1 K/uL   Immature Granulocytes 0 %   Abs Immature Granulocytes 0.03 0.00 - 0.07 K/uL   DG Chest Portable 1 View Result Date: 07/03/2023 CLINICAL DATA:  88 year old female with shortness of breath and dizziness. History  of treated breast cancer. EXAM: PORTABLE CHEST 1 VIEW COMPARISON:  Chest radiographs 09/04/2022 and earlier. FINDINGS: Portable AP semi upright view at 0617 hours. Chronic left chest 3 lead  pacer or ICD. Stable cardiomegaly and mediastinal contours. Calcified aortic atherosclerosis. Mildly lower lung volumes. Visualized tracheal air column is within normal limits. No pneumothorax. No convincing pulmonary edema, pleural effusion, or confluent lung opacity. Paucity of bowel gas. Stable visualized osseous structures. IMPRESSION: Low lung volumes. Stable cardiomegaly and no acute cardiopulmonary abnormality. Electronically Signed   By: Odessa Fleming M.D.   On: 07/03/2023 06:27   CUP PACEART REMOTE DEVICE CHECK Result Date: 06/06/2023 Scheduled remote reviewed. Normal device function.  The patient bi-V paces 88% of the time. Next remote 91 days. ML, CVRS   EKG EKG Interpretation Date/Time:  Wednesday July 03 2023 27:25:36 EST Ventricular Rate:  102 PR Interval:  217 QRS Duration:  144 QT Interval:  353 QTC Calculation: 384 R Axis:   189  Text Interpretation: Atrial fibrillation Right bundle branch block Confirmed by Aariz Maish ([id number]) on 07/03/2023 6:11:36 AM  Radiology No results found.  Procedures Procedures    Medications Ordered in ED Medications - No data to display  ED Course/ Medical Decision Making/ A&P                                 Medical Decision Making Patient with SOB and reported hypoxia on home monitor   Amount and/or Complexity of Data Reviewed Independent Historian:     Details: Daughter see above  External Data Reviewed: radiology, ECG and notes.    Details: Previous ED and outpatient visits reviewed  Labs: ordered.    Details: White count slight elevation 14, normal hemoglobin 13.4, normal platelet count  Radiology: ordered and independent interpretation performed.    Details: Cardiomegaly by me CTA ordered and pending  ECG/medicine tests: ordered and  independent interpretation performed. Decision-making details documented in ED Course.  Risk Risk Details: Oxygen is currently normal and patient is currently in rate controlled AFIB.Signed out to Dr. Karene Fry pending labs and CTA.      Final Clinical Impression(s) / ED Diagnoses        Hamdan Toscano, MD 07/03/23 [id number]

## 2023-07-03 NOTE — ED Notes (Signed)
Pt expressed concern for missing at home meds, EDP notified

## 2023-07-04 ENCOUNTER — Other Ambulatory Visit (HOSPITAL_COMMUNITY): Payer: Self-pay

## 2023-07-04 DIAGNOSIS — J9601 Acute respiratory failure with hypoxia: Secondary | ICD-10-CM | POA: Diagnosis not present

## 2023-07-04 LAB — MAGNESIUM: Magnesium: 2 mg/dL (ref 1.7–2.4)

## 2023-07-04 LAB — BASIC METABOLIC PANEL
Anion gap: 6 (ref 5–15)
BUN: 17 mg/dL (ref 8–23)
CO2: 24 mmol/L (ref 22–32)
Calcium: 8.6 mg/dL — ABNORMAL LOW (ref 8.9–10.3)
Chloride: 109 mmol/L (ref 98–111)
Creatinine, Ser: 1.04 mg/dL — ABNORMAL HIGH (ref 0.44–1.00)
GFR, Estimated: 53 mL/min — ABNORMAL LOW (ref >60)
Glucose, Bld: 87 mg/dL (ref 70–99)
Potassium: 3.9 mmol/L (ref 3.5–5.1)
Sodium: 139 mmol/L (ref 135–145)

## 2023-07-04 LAB — CBC
HCT: 39 % (ref 36.0–46.0)
Hemoglobin: 12.9 g/dL (ref 12.0–15.0)
MCH: 33.7 pg (ref 26.0–34.0)
MCHC: 33.1 g/dL (ref 30.0–36.0)
MCV: 101.8 fL — ABNORMAL HIGH (ref 80.0–100.0)
Platelets: 153 10*3/uL (ref 150–400)
RBC: 3.83 MIL/uL — ABNORMAL LOW (ref 3.87–5.11)
RDW: 13.3 % (ref 11.5–15.5)
WBC: 9.6 10*3/uL (ref 4.0–10.5)
nRBC: 0 % (ref 0.0–0.2)

## 2023-07-04 LAB — PROCALCITONIN: Procalcitonin: 0.14 ng/mL

## 2023-07-04 LAB — PROTIME-INR
INR: 1.9 — ABNORMAL HIGH (ref 0.8–1.2)
Prothrombin Time: 21.7 s — ABNORMAL HIGH (ref 11.4–15.2)

## 2023-07-04 LAB — PHOSPHORUS: Phosphorus: 3.1 mg/dL (ref 2.5–4.6)

## 2023-07-04 LAB — APTT: aPTT: 30 s (ref 24–36)

## 2023-07-04 MED ORDER — AMOXICILLIN-POT CLAVULANATE 875-125 MG PO TABS
1.0000 | ORAL_TABLET | Freq: Two times a day (BID) | ORAL | 0 refills | Status: AC
  Filled 2023-07-04: qty 10, 5d supply, fill #0

## 2023-07-04 MED ORDER — GUAIFENESIN-DM 100-10 MG/5ML PO SYRP
10.0000 mL | ORAL_SOLUTION | Freq: Four times a day (QID) | ORAL | 0 refills | Status: AC | PRN
  Filled 2023-07-04: qty 118, 3d supply, fill #0

## 2023-07-04 NOTE — Progress Notes (Signed)
Verbal and printed AVS teaching done with Carney Bern and susan, pt daughter who is present at bedside. Mikah aware to pick up prescriptions at pharmacy and how to complete full course of antibiotics, pt aware of follow up with PCP.  Anelis and Darl Pikes verbalized understanding of all teaching done and aware of reasons to contact MD or call 911. Wheelchair to lobby by staff for DC home. All personal belongings taken

## 2023-07-04 NOTE — Evaluation (Addendum)
Occupational Therapy Evaluation Patient Details Name: Sheila Schmidt MRN: [id number] DOB: 1935/08/22 Today's Date: 07/04/2023   History of Present Illness Pt is a 88 y/o female presenting on 1/22 with SOB, blurry vision. Pt found with hypoxic respiratory failure, CTA chest negative for PE; treatment for community acquired pneumonia.  PMH includes: L breast cancer, DJD, PPM, L TKA.   Clinical Impression   PTA patient independent with ADLs and mobility. Admitted for above and presents near baseline at supervision level for ADLs and functional mobility.  Pt on RA with SpO2 maintained at rest and during activity. Pt reports feeling close to baseline. Initiated education on energy conservation due to decreased activity tolerance and endurance.  Will follow acutely but anticipate no further needs after dc home.        If plan is discharge home, recommend the following: Assistance with cooking/housework    Functional Status Assessment  Patient has had a recent decline in their functional status and demonstrates the ability to make significant improvements in function in a reasonable and predictable amount of time.  Equipment Recommendations  None recommended by OT    Recommendations for Other Services       Precautions / Restrictions Precautions Precautions: Fall Restrictions Weight Bearing Restrictions Per Provider Order: No      Mobility Bed Mobility Overal bed mobility: Modified Independent             General bed mobility comments: HOB elevated but no assist required    Transfers Overall transfer level: Needs assistance   Transfers: Sit to/from Stand Sit to Stand: Supervision           General transfer comment: increased time but no assist required      Balance Overall balance assessment: Mild deficits observed, not formally tested                                         ADL either performed or assessed with clinical judgement   ADL  Overall ADL's : Needs assistance/impaired     Grooming: Supervision/safety;Standing           Upper Body Dressing : Set up;Sitting   Lower Body Dressing: Supervision/safety;Sit to/from stand   Toilet Transfer: Supervision/safety;Ambulation   Toileting- Clothing Manipulation and Hygiene: Supervision/safety;Sit to/from stand       Functional mobility during ADLs: Supervision/safety       Vision   Vision Assessment?: No apparent visual deficits     Perception         Praxis         Pertinent Vitals/Pain Pain Assessment Pain Assessment: No/denies pain     Extremity/Trunk Assessment Upper Extremity Assessment Upper Extremity Assessment: Generalized weakness   Lower Extremity Assessment Lower Extremity Assessment: RLE deficits/detail RLE Deficits / Details: "bad" R knee. Has a brace she wears for it but not all the time. Has started PT for it but hasn't been since the holidays.       Communication Communication Communication: No apparent difficulties   Cognition Arousal: Alert Behavior During Therapy: WFL for tasks assessed/performed Overall Cognitive Status: Within Functional Limits for tasks assessed                                       General Comments  VSS on RA, SpO2 during activity >95%  Exercises     Shoulder Instructions      Home Living Family/patient expects to be discharged to:: Private residence Living Arrangements: Children Available Help at Discharge: Family;Available 24 hours/day Type of Home: House Home Access: Stairs to enter [employer] of Steps: 3 Entrance Stairs-Rails: Right Home Layout: Two level;Able to live on main level with bedroom/bathroom     Bathroom Shower/Tub: Producer, television/film/video: Standard     Home Equipment: Agricultural consultant (2 wheels)          Prior Functioning/Environment Prior Level of Function : Independent/Modified Independent;Driving                         OT Problem List: Decreased activity tolerance;Impaired balance (sitting and/or standing);Decreased knowledge of use of DME or AE;Decreased knowledge of precautions      OT Treatment/Interventions: Self-care/ADL training;DME and/or AE instruction;Energy conservation;Therapeutic activities;Patient/family education    OT Goals(Current goals can be found in the care plan section) Acute Rehab OT Goals Patient Stated Goal: home OT Goal Formulation: With patient Time For Goal Achievement: 07/18/23 Potential to Achieve Goals: Good  OT Frequency: Min 1X/week    Co-evaluation              AM-PAC OT "6 Clicks" Daily Activity     Outcome Measure Help from another person eating meals?: None Help from another person taking care of personal grooming?: A Little Help from another person toileting, which includes using toliet, bedpan, or urinal?: A Little Help from another person bathing (including washing, rinsing, drying)?: A Little Help from another person to put on and taking off regular upper body clothing?: A Little Help from another person to put on and taking off regular lower body clothing?: A Little 6 Click Score: 19   End of Session Nurse Communication: Mobility status  Activity Tolerance: Patient tolerated treatment well Patient left: Other (comment) (with PT)  OT Visit Diagnosis: Other abnormalities of gait and mobility (R26.89);Muscle weakness (generalized) (M62.81)                Time: 4098-1191 OT Time Calculation (min): 15 min Charges:  OT General Charges $OT Visit: 1 Visit OT Evaluation $OT Eval Low Complexity: 1 Low  Barry Brunner, OT Acute Rehabilitation Services Office [phone]   Chancy Milroy 07/04/2023, 12:00 PM

## 2023-07-04 NOTE — Plan of Care (Signed)
  Problem: Education: Goal: Knowledge of General Education information will improve Description: Including pain rating scale, medication(s)/side effects and non-pharmacologic comfort measures Outcome: Progressing   Problem: Safety: Goal: Ability to remain free from injury will improve Outcome: Progressing   Problem: Skin Integrity: Goal: Risk for impaired skin integrity will decrease Outcome: Progressing   

## 2023-07-04 NOTE — Care Management CC44 (Signed)
Condition Code 44 Documentation Completed  Patient Details  Name: Sheila Schmidt MRN: [id number] Date of Birth: [birth year]   Condition Code 44 given:  Yes Patient signature on Condition Code 44 notice:  Yes Documentation of 2 MD's agreement:  Yes Code 44 added to claim:  Yes    Lorri Frederick, LCSW 07/04/2023, 1:30 PM

## 2023-07-04 NOTE — Discharge Summary (Signed)
[facility] Discharge Summary   Patient: Sheila Schmidt WGN:[id number]  PCP: Pearline Cables, MD  Date of admission: 07/03/2023   Date of discharge:  07/04/2023     Discharge Diagnoses:  Principal Problem:   Acute respiratory failure with hypoxia (HCC) Active Problems:   Acute hypoxic respiratory failure (HCC)   Admitted From: Home Disposition:  Home   Recommendations for Outpatient Follow-up:  Follow-up with PCP in 1 week, repeat chest x-ray after 4 weeks for resolution of pneumonia Follow up LABS/TEST: BMP after 1 week and chest x-ray after 4 weeks   Follow-up Information     Copland, Gwenlyn Found, MD Follow up.   Specialty: Family Medicine Contact information: [address] [place] [address] [phone]                Diet recommendation: Cardiac diet  Activity: The patient is advised to gradually reintroduce usual activities, as tolerated  Discharge Condition: stable  Code Status: Full code   History of present illness: As per the H and P dictated on admission Hospital Course:  TATUM CORL is a 88 y.o. female with PMH of A-fib, s/p pacemaker, dCHF, depression, history of breast cancer, as reviewed from EMR, presented [facility] due to shortness of breath and blurry vision started early morning 3:30 AM.  As per patient daughter she was not feeling well and heart rate was in 40s, O2 saturation 68% on room air at home and [facility]  Order patient was found to have hypoxic respiratory failure, workup showed pneumonitis, no PE, heart rate was fluctuating, intermittent bigeminy, A-fib with RVR.  Interrogation of pacemaker was done which showed tachycardia.  Patient was transferred to [facility] for further workup and management.   ED Course: Vital signs afebrile, HR 50-122, BP 126/63, RR 14- 22, currently saturating well on room air BMP creatinine 1.34 BNP 314, slightly elevated Troponin 16--18 WBC  14 Negative COVID, flu and RSV CTA chest negative for PE, she was possible pneumonitis  Assessment/Plan  # Acute respiratory failure with hypoxia, resolved S/p supplemental O2 inhalation, currently saturating well on room air  # Community-acquired pneumonia CTA chest ruled out PE. S/p ceftriaxone azithromycin given at [facility] ED, and Continued ceftriaxone and azithromycin IV daily during hospital stay. S/p Mucinex 600 mg p.o.  BID, DuoNeb every 6 hourly as needed. WBC count 14--- 9.6 resolved, Pro-Cal 0.14 negative.  RVP negative Patient was discharged on Augmentin twice daily for 5 days, Robitussin DM as needed for cough.  Follow with PCP and repeat chest x-ray after 4 weeks for resolution of pneumonia.   # A-fib with RVR Patient has blurry vision could be due to side effect of amiodarone as she had grapefruit juice yesterday which can increase toxicity of amiodarone Skip the dose of amiodarone on the day of admission and resumed amiodarone on the next day.  Patient remained asymptomatic.  Continued Xarelto. S/p pacemaker, interrogation was done at [facility] as per ED physician which showed tachycardia.  Heart rate is stable during hospital stay on telemetry, recommended to follow-up with cardiology as an outpatient. # Diastolic CHF and severe pulmonary hypertension Resumed Entresto home dose. BNP 314 slightly elevated Clinically euvolemic   # AKI, baseline creatinine 0.94 Creatinine 1.34 on admission, Continue oral hydration.  Creatinine 1.04 improved.  Follow-up with PCP and repeat BMP after 1 week. # History of breast cancer continue tamoxifen # Depression continue Effexor    Body  mass index is 33.06 kg/m.  Nutrition Interventions:  Patient was ambulatory without any assistance. On the day of the discharge the patient's vitals were stable, and no other acute medical condition were reported by patient. the patient was felt safe to be discharge at Home.  Consultants:  None Procedures: None  Discharge Exam: General: Appear in no distress, no Rash; Oral Mucosa Clear, moist. Cardiovascular: S1 and S2 Present, no Murmur, Respiratory: normal respiratory effort, Bilateral Air entry present and no Crackles, no wheezes Abdomen: Bowel Sound present, Soft and no tenderness, no hernia Extremities: no Pedal edema, no calf tenderness Neurology: alert and oriented to time, place, and person affect appropriate.  Filed Weights   07/03/23 0604  Weight: 82 kg   Vitals:   07/04/23 0527 07/04/23 0750  BP: 115/74 136/69  Pulse: (!) 59 (!) 59  Resp: 18 16  Temp:  97.7 F (36.5 C)  SpO2: 95% 96%    DISCHARGE MEDICATION: Allergies as of 07/04/2023   No Known Allergies      Medication List     STOP taking these medications    acetaminophen-codeine 300-30 MG tablet Commonly known as: TYLENOL #3       TAKE these medications    acetaminophen 500 MG tablet Commonly known as: TYLENOL Take 500-1,000 mg by mouth as needed for moderate pain (pain score 4-6).   amiodarone 100 MG tablet Commonly known as: Pacerone Take 1 tablet (100 mg total) by mouth daily.   amoxicillin-clavulanate 875-125 MG tablet Commonly known as: AUGMENTIN Take 1 tablet by mouth 2 (two) times daily for 5 days.   atorvastatin 20 MG tablet Commonly known as: LIPITOR Take 1 tablet (20 mg total) by mouth daily. What changed: when to take this   calcium carbonate 500 MG chewable tablet Commonly known as: TUMS - dosed in mg elemental calcium Chew 1 tablet by mouth as needed for indigestion or heartburn.   Caltrate 600+D Plus Minerals 600-800 MG-UNIT Chew Chew 1 each by mouth daily.   Cholecalciferol 50 MCG (2000 UT) Tabs Take 2,000 Units by mouth daily.   Entresto 24-26 MG Generic drug: sacubitril-valsartan TAKE 1 TABLET BY MOUTH TWICE  DAILY   famotidine 40 MG tablet Commonly known as: PEPCID Take 40 mg by mouth daily as needed for heartburn or indigestion.    furosemide 20 MG tablet Commonly known as: LASIX Take 20 mg by mouth as needed for fluid or edema.   guaiFENesin-dextromethorphan 100-10 MG/5ML syrup Commonly known as: ROBITUSSIN DM Take 10 mLs by mouth every 6 (six) hours as needed for cough.   melatonin 5 MG Tabs Take 1.25 mg by mouth at bedtime as needed (sleep).   rivaroxaban 20 MG Tabs tablet Commonly known as: Xarelto Take 1 tablet (20 mg total) by mouth daily with supper.   tamoxifen 20 MG tablet Commonly known as: NOLVADEX TAKE 1 TABLET BY MOUTH DAILY   venlafaxine XR 37.5 MG 24 hr capsule Commonly known as: EFFEXOR-XR Take 1 capsule (37.5 mg total) by mouth daily with breakfast.   vitamin B-12 100 MCG tablet Commonly known as: CYANOCOBALAMIN Take 100 mcg by mouth daily.       No Known Allergies Discharge Instructions     Call MD for:  difficulty breathing, headache or visual disturbances   Complete by: As directed    Call MD for:  persistant dizziness or light-headedness   Complete by: As directed    Call MD for:  persistant nausea and vomiting   Complete by: As  directed    Call MD for:  severe uncontrolled pain   Complete by: As directed    Call MD for:  temperature >100.4   Complete by: As directed    Diet - low sodium heart healthy   Complete by: As directed    Discharge instructions   Complete by: As directed    Follow-up with PCP in 1 week, repeat chest x-ray after 4 weeks for resolution of pneumonia   Increase activity slowly   Complete by: As directed        The results of significant diagnostics from this hospitalization (including imaging, microbiology, ancillary and laboratory) are listed below for reference.    Significant Diagnostic Studies: CT Angio Chest PE W and/or Wo Contrast Result Date: 07/03/2023 CLINICAL DATA:  Syncope/presyncope, cerebrovascular cause suspected. Shortness of breath. Dizziness. EXAM: CT ANGIOGRAPHY CHEST WITH CONTRAST TECHNIQUE: Multidetector CT imaging of the  chest was performed using the standard protocol during bolus administration of intravenous contrast. Multiplanar CT image reconstructions and MIPs were obtained to evaluate the vascular anatomy. RADIATION DOSE REDUCTION: This exam was performed according to the departmental dose-optimization program which includes automated exposure control, adjustment of the mA and/or kV according to patient size and/or use of iterative reconstruction technique. CONTRAST:  60mL OMNIPAQUE IOHEXOL 350 MG/ML SOLN COMPARISON:  CT scan chest from 12/07/2005. FINDINGS: Cardiovascular: No evidence of embolism to the proximal subsegmental pulmonary artery level. There is dilation of the main pulmonary trunk measuring up to 3.5 cm, which is nonspecific but can be seen with pulmonary artery hypertension. Mild cardiomegaly. No pericardial effusion. No aortic aneurysm. There are coronary artery calcifications, in keeping with coronary artery disease. There are also moderate peripheral atherosclerotic vascular calcifications of thoracic aorta and its major branches. There is a left sided 3-lead pacemaker. Mediastinum/Nodes: Visualized thyroid gland appears grossly unremarkable. No solid / cystic mediastinal masses. The esophagus is nondistended precluding optimal assessment. No axillary, mediastinal or hilar lymphadenopathy by size criteria. Lungs/Pleura: The central tracheo-bronchial tree is patent. There are patchy areas of linear, plate-like atelectasis and/or scarring throughout bilateral lungs. There also several patchy ground-glass opacities in the left upper lobe and middle lobe. No mass or consolidation. No pleural effusion or pneumothorax. No suspicious lung nodules. Upper Abdomen: Surgically absent gallbladder. There is a partially imaged cystic structure arising from the right kidney upper pole. There are scattered diverticula in the splenic flexure of colon without diverticulitis. There is a small sliding hiatal hernia. Remaining  visualized upper abdominal viscera within normal limits. Musculoskeletal: The visualized soft tissues of the chest wall are grossly unremarkable. No suspicious osseous lesions. There are mild multilevel degenerative changes in the visualized spine. Review of the MIP images confirms the above findings. IMPRESSION: 1. No embolism to the proximal subsegmental pulmonary artery level. 2. There are several patchy ground-glass opacities in the left upper lobe and middle lobe. These are nonspecific and differential diagnosis includes but not limited to are infection, chronic interstitial disease, hypersensitivity pneumonitis, etc. 3. Multiple other nonacute observations, as described above. Aortic Atherosclerosis (ICD10-I70.0). Electronically Signed   By: Jules Schick M.D.   On: 07/03/2023 08:57   DG Chest Portable 1 View Result Date: 07/03/2023 CLINICAL DATA:  88 year old female with shortness of breath and dizziness. History of treated breast cancer. EXAM: PORTABLE CHEST 1 VIEW COMPARISON:  Chest radiographs 09/04/2022 and earlier. FINDINGS: Portable AP semi upright view at 0617 hours. Chronic left chest 3 lead pacer or ICD. Stable cardiomegaly and mediastinal contours. Calcified aortic atherosclerosis. Mildly  lower lung volumes. Visualized tracheal air column is within normal limits. No pneumothorax. No convincing pulmonary edema, pleural effusion, or confluent lung opacity. Paucity of bowel gas. Stable visualized osseous structures. IMPRESSION: Low lung volumes. Stable cardiomegaly and no acute cardiopulmonary abnormality. Electronically Signed   By: Odessa Fleming M.D.   On: 07/03/2023 06:27    Microbiology: Recent Results (from the past 240 hours)  Resp panel by RT-PCR (RSV, Flu A&B, Covid) Anterior Nasal Swab     Status: None   Collection Time: 07/03/23  6:13 AM   Specimen: Anterior Nasal Swab  Result Value Ref Range Status   SARS Coronavirus 2 by RT PCR NEGATIVE NEGATIVE Final    Comment: (NOTE) SARS-CoV-2  target nucleic acids are NOT DETECTED.  The SARS-CoV-2 RNA is generally detectable in upper respiratory specimens during the acute phase of infection. The lowest concentration of SARS-CoV-2 viral copies this assay can detect is 138 copies/mL. A negative result does not preclude SARS-Cov-2 infection and should not be used as the sole basis for treatment or other patient management decisions. A negative result may occur with  improper specimen collection/handling, submission of specimen other than nasopharyngeal swab, presence of viral mutation(s) within the areas targeted by this assay, and inadequate number of viral copies(<138 copies/mL). A negative result must be combined with clinical observations, patient history, and epidemiological information. The expected result is Negative.  Fact Sheet for Patients:  BloggerCourse.com  Fact Sheet for Healthcare Providers:  SeriousBroker.it  This test is no t yet approved or cleared by the [place] FDA and  has been authorized for detection and/or diagnosis of SARS-CoV-2 by FDA under an Emergency Use Authorization (EUA). This EUA will remain  in effect (meaning this test can be used) for the duration of the COVID-19 declaration under Section 564(b)(1) of the Act, 21 U.S.C.section 360bbb-3(b)(1), unless the authorization is terminated  or revoked sooner.       Influenza A by PCR NEGATIVE NEGATIVE Final   Influenza B by PCR NEGATIVE NEGATIVE Final    Comment: (NOTE) The Xpert Xpress SARS-CoV-2/FLU/RSV plus assay is intended as an aid in the diagnosis of influenza from Nasopharyngeal swab specimens and should not be used as a sole basis for treatment. Nasal washings and aspirates are unacceptable for Xpert Xpress SARS-CoV-2/FLU/RSV testing.  Fact Sheet for Patients: BloggerCourse.com  Fact Sheet for Healthcare  Providers: SeriousBroker.it  This test is not yet approved or cleared by the [place] FDA and has been authorized for detection and/or diagnosis of SARS-CoV-2 by FDA under an Emergency Use Authorization (EUA). This EUA will remain in effect (meaning this test can be used) for the duration of the COVID-19 declaration under Section 564(b)(1) of the Act, 21 U.S.C. section 360bbb-3(b)(1), unless the authorization is terminated or revoked.     Resp Syncytial Virus by PCR NEGATIVE NEGATIVE Final    Comment: (NOTE) Fact Sheet for Patients: BloggerCourse.com  Fact Sheet for Healthcare Providers: SeriousBroker.it  This test is not yet approved or cleared by the [place] FDA and has been authorized for detection and/or diagnosis of SARS-CoV-2 by FDA under an Emergency Use Authorization (EUA). This EUA will remain in effect (meaning this test can be used) for the duration of the COVID-19 declaration under Section 564(b)(1) of the Act, 21 U.S.C. section 360bbb-3(b)(1), unless the authorization is terminated or revoked.  Performed at [facility], [address] [place], [place] [address]   Respiratory (~20 pathogens) panel by PCR  Status: None   Collection Time: 07/03/23  9:45 PM   Specimen: Nasopharyngeal Swab; Respiratory  Result Value Ref Range Status   Adenovirus NOT DETECTED NOT DETECTED Final   Coronavirus 229E NOT DETECTED NOT DETECTED Final    Comment: (NOTE) The Coronavirus on the Respiratory Panel, DOES NOT test for the novel  Coronavirus (2019 nCoV)    Coronavirus HKU1 NOT DETECTED NOT DETECTED Final   Coronavirus NL63 NOT DETECTED NOT DETECTED Final   Coronavirus OC43 NOT DETECTED NOT DETECTED Final   Metapneumovirus NOT DETECTED NOT DETECTED Final   Rhinovirus / Enterovirus NOT DETECTED NOT DETECTED Final   Influenza A NOT DETECTED NOT DETECTED Final   Influenza B NOT  DETECTED NOT DETECTED Final   Parainfluenza Virus 1 NOT DETECTED NOT DETECTED Final   Parainfluenza Virus 2 NOT DETECTED NOT DETECTED Final   Parainfluenza Virus 3 NOT DETECTED NOT DETECTED Final   Parainfluenza Virus 4 NOT DETECTED NOT DETECTED Final   Respiratory Syncytial Virus NOT DETECTED NOT DETECTED Final   Bordetella pertussis NOT DETECTED NOT DETECTED Final   Bordetella Parapertussis NOT DETECTED NOT DETECTED Final   Chlamydophila pneumoniae NOT DETECTED NOT DETECTED Final   Mycoplasma pneumoniae NOT DETECTED NOT DETECTED Final    Comment: Performed at [facility], [address], [place], [place] [address]     Labs: CBC: Recent Labs  Lab 07/03/23 [phone] 07/04/23 0556  WBC 14.0* 9.6  NEUTROABS 8.7*  --   HGB 13.4 12.9  HCT 40.4 39.0  MCV 101.8* 101.8*  PLT 167 153   Basic Metabolic Panel: Recent Labs  Lab 07/03/23 0613 07/03/23 2113 07/04/23 0556  NA 139  --  139  K 4.1  --  3.9  CL 105  --  109  CO2 23  --  24  GLUCOSE 100*  --  87  BUN 23  --  17  CREATININE 1.34*  --  1.04*  CALCIUM 9.3  --  8.6*  MG  --  2.0 2.0  PHOS  --  2.5 3.1   Liver Function Tests: No results for input(s): "AST", "ALT", "ALKPHOS", "BILITOT", "PROT", "ALBUMIN" in the last 168 hours. No results for input(s): "LIPASE", "AMYLASE" in the last 168 hours. No results for input(s): "AMMONIA" in the last 168 hours. Cardiac Enzymes: No results for input(s): "CKTOTAL", "CKMB", "CKMBINDEX", "TROPONINI" in the last 168 hours. BNP (last 3 results) Recent Labs    07/03/23 0613  BNP 314.5*   CBG: No results for input(s): "GLUCAP" in the last 168 hours.  Time spent: 35 minutes  Signed:  Gillis Santa  [facility] 07/04/2023 1:15 PM

## 2023-07-04 NOTE — Evaluation (Addendum)
Physical Therapy Evaluation and Discharge  Patient Details Name: Sheila Schmidt MRN: [id number] DOB: 07/25/1935 Today's Date: 07/04/2023  History of Present Illness  Pt is a 88 y/o female presenting on 1/22 with SOB, blurry vision. Pt found with hypoxic respiratory failure, CTA chest negative for PE; treatment for community acquired pneumonia.  PMH includes: L breast cancer, DJD, PPM, L TKA.   Clinical Impression  Patient evaluated by Physical Therapy with no further acute PT needs identified. All education has been completed and the patient has no further questions. At the time of PT eval pt was able to perform transfers and ambulation with gross modified independence and no AD. Although mildly dyspneic throughout ambulation, O2 sats >93% on RA throughout OOB mobility. Pt reports no concern with being able to return home and manage with mobility or ADL tasks. See below for any follow-up Physical Therapy or equipment needs. PT is signing off. Thank you for this referral.         If plan is discharge home, recommend the following: Assist for transportation   Can travel by private vehicle        Equipment Recommendations None recommended by PT  Recommendations for Other Services       Functional Status Assessment Patient has not had a recent decline in their functional status     Precautions / Restrictions Precautions Precautions: None Restrictions Weight Bearing Restrictions Per Provider Order: No      Mobility  Bed Mobility               General bed mobility comments: Pt was received OOB with OT    Transfers Overall transfer level: Modified independent Equipment used: None Transfers: Sit to/from Stand             General transfer comment: No assist required and no overt LOB noted.    Ambulation/Gait Ambulation/Gait assistance: Modified independent (Device/Increase time) Gait Distance (Feet): 250 Feet Assistive device: None Gait Pattern/deviations:  Step-through pattern, Decreased stride length, Decreased weight shift to right, Trunk flexed Gait velocity: Decreased Gait velocity interpretation: 1.31 - 2.62 ft/sec, indicative of limited community ambulator   General Gait Details: Slow but generally steady without UE support. Pt favoring R LE some but likely baseline due to chronic R knee pain. Pt appears mildly dyspneic with ambulation but sats >93% throughout on RA.  Stairs Stairs: Yes Stairs assistance: Supervision Stair Management: Alternating pattern, Forwards, One rail Right Number of Stairs: 5 General stair comments: Practice stairs in rehab gym. No difficulty advancing through stairs  Wheelchair Mobility     Tilt Bed    Modified Rankin (Stroke Patients Only)       Balance Overall balance assessment: Mild deficits observed, not formally tested                                           Pertinent Vitals/Pain Pain Assessment Pain Assessment: No/denies pain    Home Living Family/patient expects to be discharged to:: Private residence Living Arrangements: Children Available Help at Discharge: Family;Available 24 hours/day Type of Home: House Home Access: Stairs to enter Entrance Stairs-Rails: Right Entrance Stairs-Number of Steps: 3   Home Layout: Two level;Able to live on main level with bedroom/bathroom Home Equipment: Rolling Walker (2 wheels)      Prior Function Prior Level of Function : Independent/Modified Independent;Driving  Extremity/Trunk Assessment   Upper Extremity Assessment Upper Extremity Assessment: Defer to OT evaluation    Lower Extremity Assessment Lower Extremity Assessment: RLE deficits/detail RLE Deficits / Details: Baseline "bad" R knee. Has a brace she wears for it but not all the time. Has started PT for it but hasn't been since the holidays.       Communication   Communication Communication: No apparent difficulties  Cognition  Arousal: Alert Behavior During Therapy: WFL for tasks assessed/performed Overall Cognitive Status: Within Functional Limits for tasks assessed                                          General Comments General comments (skin integrity, edema, etc.): VSS on RA, SpO2 during activity >95%    Exercises     Assessment/Plan    PT Assessment Patient does not need any further PT services  PT Problem List         PT Treatment Interventions      PT Goals (Current goals can be found in the Care Plan section)  Acute Rehab PT Goals Patient Stated Goal: Home at d/c PT Goal Formulation: All assessment and education complete, DC therapy    Frequency       Co-evaluation               AM-PAC PT "6 Clicks" Mobility  Outcome Measure Help needed turning from your back to your side while in a flat bed without using bedrails?: None Help needed moving from lying on your back to sitting on the side of a flat bed without using bedrails?: None Help needed moving to and from a bed to a chair (including a wheelchair)?: None Help needed standing up from a chair using your arms (e.g., wheelchair or bedside chair)?: None Help needed to walk in hospital room?: None Help needed climbing 3-5 steps with a railing? : None 6 Click Score: 24    End of Session Equipment Utilized During Treatment: Gait belt Activity Tolerance: Patient tolerated treatment well Patient left: in chair;with call bell/phone within reach;with family/visitor present Nurse Communication: Mobility status PT Visit Diagnosis: Difficulty in walking, not elsewhere classified (R26.2)    Time: 1610-9604 PT Time Calculation (min) (ACUTE ONLY): 13 min   Charges:   PT Evaluation $PT Eval Low Complexity: 1 Low   PT General Charges $$ ACUTE PT VISIT: 1 Visit         Conni Slipper, PT, DPT Acute Rehabilitation Services Secure Chat Preferred Office: [phone]   Marylynn Pearson 07/04/2023, 2:16 PM

## 2023-07-04 NOTE — Care Management Obs Status (Signed)
MEDICARE OBSERVATION STATUS NOTIFICATION   Patient Details  Name: Sheila Schmidt MRN: [id number] Date of Birth: November 09, 1935   Medicare Observation Status Notification Given:  Yes    Lorri Frederick, LCSW 07/04/2023, 1:29 PM

## 2023-07-05 ENCOUNTER — Encounter: Payer: Self-pay | Admitting: Family Medicine

## 2023-07-13 NOTE — Patient Instructions (Incomplete)
It was good to see you today, I am glad you are feeling better Please come in for a chest film in about a month- can just walk in to have this done   Let me know if you don't continue to get your strength back   Recommend a dose of RSV

## 2023-07-13 NOTE — Progress Notes (Unsigned)
[facility] [address] [place], [place] [address] [phone]  Date:  07/15/2023   Name:  DANEEN VOLCY   DOB:  01/25/1936   MRN:  [id number]  PCP:  Pearline Cables, MD    Chief Complaint: No chief complaint on file.   History of Present Illness:  Sheila Schmidt is a 88 y.o. very pleasant female patient who presents with the following:  I saw Rheya most recently in November: History of nonischemic cardiomyopathy with pacemaker placement in June 2023,also history of atrial fibrillation, hyperlipidemia, chronic knee pain from arthritis, osteopenia, breast cancer LEFT   She was found to have recurrent breast cancer 2024, had a lumpectomy in early April 2024 with a limited node dissection  She saw her cardiologist also on May 28, 2023: DIAMON REDDINGER is a 88 y.o. female past medical history significant for paroxysmal atrial fibrillation successfully suppressed with amiodarone, she is anticoagulated, history of cardiomyopathy reduced nonischemic with estimated ejection fraction initially 35%, BiV pacing implanted in June 2023 since that time improvement and normalization.  She also had mastectomy done for breast cancer no chemotherapy required after that she is taking tamoxifen.   Patient seen today for follow-up-she was recently admitted overnight, 1/22 through 07/04/2023: Hospital Course:  JAYANA KOTULA is a 88 y.o. female with PMH of A-fib, s/p pacemaker, dCHF, depression, history of breast cancer, as reviewed from EMR, presented [facility] due to shortness of breath and blurry vision started early morning 3:30 AM.  As per patient daughter she was not feeling well and heart rate was in 40s, O2 saturation 68% on room air at home and [facility].  Order patient was found to have hypoxic respiratory failure, workup showed pneumonitis, no PE, heart rate was fluctuating, intermittent bigeminy,  A-fib with RVR.  Interrogation of pacemaker was done which showed tachycardia.  Patient was transferred to [facility] for further workup and management. Assessment/Plan # Acute respiratory failure with hypoxia, resolved S/p supplemental O2 inhalation, currently saturating well on room air  # Community-acquired pneumonia CTA chest ruled out PE. S/p ceftriaxone azithromycin given at [facility] ED, and Continued ceftriaxone and azithromycin IV daily during hospital stay. S/p Mucinex 600 mg p.o.  BID, DuoNeb every 6 hourly as needed. WBC count 14--- 9.6 resolved, Pro-Cal 0.14 negative.  RVP negative Patient was discharged on Augmentin twice daily for 5 days, Robitussin DM as needed for cough.  Follow with PCP and repeat chest x-ray after 4 weeks for resolution of pneumonia. # A-fib with RVR Patient has blurry vision could be due to side effect of amiodarone as she had grapefruit juice yesterday which can increase toxicity of amiodarone Skip the dose of amiodarone on the day of admission and resumed amiodarone on the next day.  Patient remained asymptomatic.  Continued Xarelto. S/p pacemaker, interrogation was done at [facility] as per ED physician which showed tachycardia.  Heart rate is stable during hospital stay on telemetry, recommended to follow-up with cardiology as an outpatient. # Diastolic CHF and severe pulmonary hypertension Resumed Entresto home dose. BNP 314 slightly elevated Clinically euvolemic # AKI, baseline creatinine 0.94 Creatinine 1.34 on admission, Continue oral hydration.  Creatinine 1.04 improved.  Follow-up with PCP and repeat BMP after 1 week. # History of breast cancer continue tamoxifen # Depression continue Effexor    Patient Active Problem List   Diagnosis Date Noted   Acute respiratory  failure with hypoxia (HCC) 07/03/2023   Acute hypoxic respiratory failure (HCC) 07/03/2023   Hypertensive disorder 01/15/2023   Dyslipidemia 01/15/2023   Genetic testing 09/18/2022    Pacemaker [employer] device BiV 09/06/2022   Malignant neoplasm of upper-outer quadrant of left breast in female, estrogen receptor positive (HCC) 08/06/2022   Pure hypercholesterolemia 05/15/2022   Skin cancer 03/07/2022   Closed nondisplaced fracture of distal phalanx of right thumb 02/15/2022   Atrial fibrillation (HCC) 12/04/2021   Cancer (HCC) 05/25/2021   Left bundle branch block 04/12/2021   Dilated cardiomyopathy (HCC) 04/12/2021   Vasculitis (HCC) 02/22/2021   Anxiety state 02/12/2021   Body fluid retention 02/12/2021   Cervical intraepithelial neoplasia 02/12/2021   Insomnia 02/12/2021   Pruritus of vulva 02/12/2021   Leg wound, right 10/03/2020   Bilateral primary osteoarthritis of knee 03/30/2019   Paroxysmal atrial fibrillation (HCC) 04/07/2015   Chronic anticoagulation 04/07/2015   Paroxysmal atrial flutter (HCC) 03/16/2015   Essential hypertension, benign 02/24/2014   Sciatica 12/08/2013   Hyperlipidemia 09/25/2013   Osteopenia 11/19/2012   Low back pain 01/26/2010   Carcinoma in situ of breast 09/04/2009   DEPRESSION, PROLONGED 09/04/2009   OA (osteoarthritis) of knee 09/04/2009   PERIPHERAL EDEMA 09/04/2009   History of colonic polyps 09/04/2009    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   Dysrhythmia    Afib   Heart murmur    Hx of adenomatous colonic polyps    Hyperlipidemia    Presence of permanent cardiac pacemaker     Past Surgical History:  Procedure Laterality Date   ATRIAL FLUTTER ABLATION     BIV PACEMAKER INSERTION CRT-P N/A 12/04/2021   Procedure: BIV PACEMAKER INSERTION CRT-P;  Surgeon: Lanier Prude, MD;  Location: [facility]VASIVE [facility];  Service: Cardiovascular;  Laterality: N/A;   BREAST BIOPSY Left 07/30/2022   [place] LT BREAST BX W LOC DEV 1ST LESION IMG BX SPEC US GUIDE 07/30/2022 [facility]    BREAST BIOPSY Left 09/10/2022   [place] LT RADIOACTIVE SEED LOC 09/10/2022 [facility]   BREAST BIOPSY Left 09/10/2022   [place] LT RADIOACTIVE SEED LOC 09/10/2022 [facility]   BREAST EXCISIONAL BIOPSY Left 2010   benign   BREAST LUMPECTOMY WITH RADIOACTIVE SEED AND SENTINEL LYMPH NODE BIOPSY Left 09/11/2022   Procedure: LEFT BREAST LUMPECTOMY WITH RADIOACTIVE SEED;  Surgeon: Harriette Bouillon, MD;  Location: [facility] OR;  Service: General;  Laterality: Left;   CARDIOVERSION  05/2020   in [facility]   CARDIOVERSION N/A 06/21/2021   Procedure: CARDIOVERSION;  Surgeon: Sande Rives, MD;  Location: [facility] ENDOSCOPY;  Service: Cardiovascular;  Laterality: N/A;   CARDIOVERSION N/A 03/26/2022   Procedure: CARDIOVERSION;  Surgeon: Lewayne Bunting, MD;  Location: [facility] ENDOSCOPY;  Service: Cardiovascular;  Laterality: N/A;   CHOLECYSTECTOMY     CHOLECYSTECTOMY, LAPAROSCOPIC  '06   [name]   KNEE ARTHROSCOPY     Left '00 [place]/ Right '08   lumpectomy- remote     benign   RADIOACTIVE SEED GUIDED AXILLARY SENTINEL LYMPH NODE Left 09/11/2022   Procedure: RADIOACTIVE SEED GUIDED LEFT SENTINEL LYMPH NODE BIOPSY;  Surgeon: Harriette Bouillon, MD;  Location: [facility] OR;  Service: General;  Laterality: Left;   TOOTH EXTRACTION     TOTAL KNEE ARTHROPLASTY  02/05/2011   left  Social History   Tobacco Use   Smoking status: Never    Passive exposure: Never   Smokeless tobacco: Never  Vaping Use   Vaping status: Never Used  Substance Use Topics   Alcohol use: Yes    Comment: 1 glass per day   Drug use: No    Family History  Problem Relation Age of Onset   Diabetes Mother        Deceased in 1s   Heart disease Mother        cad/MI-fatal   Heart attack Mother    Liver cancer Father 63   Heart disease Sister    Breast cancer Neg Hx    Colon cancer Neg Hx     No Known Allergies  Medication list has been reviewed and updated.  Current Outpatient Medications on File Prior to Visit  Medication Sig Dispense  Refill   acetaminophen (TYLENOL) 500 MG tablet Take 500-1,000 mg by mouth as needed for moderate pain (pain score 4-6).     amiodarone (PACERONE) 100 MG tablet Take 1 tablet (100 mg total) by mouth daily. 90 tablet 2   atorvastatin (LIPITOR) 20 MG tablet Take 1 tablet (20 mg total) by mouth daily. (Patient taking differently: Take 20 mg by mouth at bedtime.) 90 tablet 1   calcium carbonate (TUMS - DOSED IN MG ELEMENTAL CALCIUM) 500 MG chewable tablet Chew 1 tablet by mouth as needed for indigestion or heartburn.     Calcium Carbonate-Vit D-Min (CALTRATE 600+D PLUS MINERALS) 600-800 MG-UNIT CHEW Chew 1 each by mouth daily.     Cholecalciferol 50 MCG (2000 UT) TABS Take 2,000 Units by mouth daily.     famotidine (PEPCID) 40 MG tablet Take 40 mg by mouth daily as needed for heartburn or indigestion.     furosemide (LASIX) 20 MG tablet Take 20 mg by mouth as needed for fluid or edema.     guaiFENesin-dextromethorphan (ROBITUSSIN DM) 100-10 MG/5ML syrup Take 10 mLs by mouth every 6 (six) hours as needed for cough. 118 mL 0   melatonin 5 MG TABS Take 1.25 mg by mouth at bedtime as needed (sleep).     rivaroxaban (XARELTO) 20 MG TABS tablet Take 1 tablet (20 mg total) by mouth daily with supper. 90 tablet 1   sacubitril-valsartan (ENTRESTO) 24-26 MG TAKE 1 TABLET BY MOUTH TWICE  DAILY 180 tablet 0   tamoxifen (NOLVADEX) 20 MG tablet TAKE 1 TABLET BY MOUTH DAILY 90 tablet 3   venlafaxine XR (EFFEXOR-XR) 37.5 MG 24 hr capsule Take 1 capsule (37.5 mg total) by mouth daily with breakfast. 90 capsule 1   vitamin B-12 (CYANOCOBALAMIN) 100 MCG tablet Take 100 mcg by mouth daily.     No current facility-administered medications on file prior to visit.    Review of Systems:  As per HPI- otherwise negative.   Physical Examination: There were no vitals filed for this visit. There were no vitals filed for this visit. There is no height or weight on file to calculate BMI. Ideal Body Weight:    GEN: no  acute distress. HEENT: Atraumatic, Normocephalic.  Ears and Nose: No external deformity. CV: RRR, No M/G/R. No JVD. No thrill. No extra heart sounds. PULM: CTA B, no wheezes, crackles, rhonchi. No retractions. No resp. distress. No accessory muscle use. ABD: S, NT, ND, +BS. No rebound. No HSM. EXTR: No c/c/e PSYCH: Normally interactive. Conversant.    Assessment and Plan: ***  Signed Abbe Amsterdam, MD

## 2023-07-15 ENCOUNTER — Ambulatory Visit: Payer: Medicare Other | Admitting: Family Medicine

## 2023-07-15 VITALS — BP 110/62 | HR 60 | Temp 98.1°F | Resp 18 | Ht 62.0 in | Wt 179.4 lb

## 2023-07-15 DIAGNOSIS — I480 Paroxysmal atrial fibrillation: Secondary | ICD-10-CM

## 2023-07-15 DIAGNOSIS — Z09 Encounter for follow-up examination after completed treatment for conditions other than malignant neoplasm: Secondary | ICD-10-CM

## 2023-07-15 DIAGNOSIS — J189 Pneumonia, unspecified organism: Secondary | ICD-10-CM

## 2023-07-15 DIAGNOSIS — J9601 Acute respiratory failure with hypoxia: Secondary | ICD-10-CM

## 2023-07-15 DIAGNOSIS — I5032 Chronic diastolic (congestive) heart failure: Secondary | ICD-10-CM

## 2023-07-15 DIAGNOSIS — I270 Primary pulmonary hypertension: Secondary | ICD-10-CM

## 2023-07-23 ENCOUNTER — Ambulatory Visit
Admission: RE | Admit: 2023-07-23 | Discharge: 2023-07-23 | Disposition: A | Discharge status: Home / Self Care | Payer: Medicare Other | Source: Ambulatory Visit | Attending: Hematology

## 2023-07-23 DIAGNOSIS — C50412 Malignant neoplasm of upper-outer quadrant of left female breast: Secondary | ICD-10-CM

## 2023-08-06 ENCOUNTER — Encounter: Payer: Self-pay | Admitting: Family Medicine

## 2023-08-12 ENCOUNTER — Ambulatory Visit (HOSPITAL_BASED_OUTPATIENT_CLINIC_OR_DEPARTMENT_OTHER)
Admission: RE | Admit: 2023-08-12 | Discharge: 2023-08-12 | Disposition: A | Discharge status: Home / Self Care | Source: Ambulatory Visit | Attending: Family Medicine | Admitting: Family Medicine

## 2023-08-12 ENCOUNTER — Encounter: Payer: Self-pay | Admitting: Family Medicine

## 2023-08-12 ENCOUNTER — Other Ambulatory Visit: Payer: Self-pay | Admitting: Family Medicine

## 2023-08-12 DIAGNOSIS — J189 Pneumonia, unspecified organism: Secondary | ICD-10-CM | POA: Insufficient documentation

## 2023-09-03 ENCOUNTER — Ambulatory Visit (INDEPENDENT_AMBULATORY_CARE_PROVIDER_SITE_OTHER): Payer: Medicare Other

## 2023-09-03 DIAGNOSIS — I420 Dilated cardiomyopathy: Secondary | ICD-10-CM | POA: Diagnosis not present

## 2023-09-03 LAB — CUP PACEART REMOTE DEVICE CHECK
Battery Remaining Longevity: 67 mo
Battery Remaining Percentage: 78 %
Battery Voltage: 2.99 V
Brady Statistic AP VP Percent: 83 %
Brady Statistic AP VS Percent: 1 %
Brady Statistic AS VP Percent: 5.4 %
Brady Statistic AS VS Percent: 11 %
Brady Statistic RA Percent Paced: 78 %
Date Time Interrogation Session: 20250325040010
Implantable Lead Connection Status: 753985
Implantable Lead Connection Status: 753985
Implantable Lead Connection Status: 753985
Implantable Lead Implant Date: 20230626
Implantable Lead Implant Date: 20230626
Implantable Lead Implant Date: 20230626
Implantable Lead Location: 753858
Implantable Lead Location: 753859
Implantable Lead Location: 753860
Implantable Pulse Generator Implant Date: 20230626
Lead Channel Impedance Value: 410 Ohm
Lead Channel Impedance Value: 460 Ohm
Lead Channel Impedance Value: 930 Ohm
Lead Channel Pacing Threshold Amplitude: 0.5 V
Lead Channel Pacing Threshold Amplitude: 0.75 V
Lead Channel Pacing Threshold Amplitude: 1.25 V
Lead Channel Pacing Threshold Pulse Width: 0.5 ms
Lead Channel Pacing Threshold Pulse Width: 0.5 ms
Lead Channel Pacing Threshold Pulse Width: 0.5 ms
Lead Channel Sensing Intrinsic Amplitude: 1.4 mV
Lead Channel Sensing Intrinsic Amplitude: 10.8 mV
Lead Channel Setting Pacing Amplitude: 2.25 V
Lead Channel Setting Pacing Amplitude: 2.5 V
Lead Channel Setting Pacing Amplitude: 2.5 V
Lead Channel Setting Pacing Pulse Width: 0.5 ms
Lead Channel Setting Pacing Pulse Width: 0.5 ms
Lead Channel Setting Sensing Sensitivity: 2 mV
Pulse Gen Model: 3562
Pulse Gen Serial Number: 8071621

## 2023-09-07 ENCOUNTER — Encounter: Payer: Self-pay | Admitting: Cardiology

## 2023-10-15 ENCOUNTER — Other Ambulatory Visit: Payer: Self-pay | Admitting: Family Medicine

## 2023-10-15 DIAGNOSIS — I480 Paroxysmal atrial fibrillation: Secondary | ICD-10-CM

## 2023-10-15 DIAGNOSIS — E785 Hyperlipidemia, unspecified: Secondary | ICD-10-CM

## 2023-10-18 NOTE — Progress Notes (Signed)
 Remote pacemaker transmission.

## 2023-10-27 NOTE — Assessment & Plan Note (Signed)
 [id number], stage IA, ER+/PR+/HER2-, G2 -She was diagnosed in February 2024 -She underwent left breast lumpectomy and sentinel lymph node biopsy, I discussed her surgical findings with patient in detail.  Surgical margins were negative -Adjuvant radiation was recommended and offered, however due to her pacemaker on the same side of her breast cancer, the ultimate decision was not to proceed with adjuvant radiation due to her pacemaker and her advanced age. -Due to her advanced age, she is not a candidate for chemotherapy, so Oncotype was not obtained. -She started adjuvant tamoxifen in early June 2020, tolerating well overall, plan for 5 years  -continue cancer surveillance

## 2023-10-28 ENCOUNTER — Encounter: Payer: Self-pay | Admitting: Hematology

## 2023-10-28 ENCOUNTER — Inpatient Hospital Stay: Payer: Medicare Other | Attending: Hematology | Admitting: Hematology

## 2023-10-28 ENCOUNTER — Inpatient Hospital Stay: Payer: Medicare Other

## 2023-10-28 VITALS — BP 120/60 | HR 97 | Temp 97.3°F | Resp 17 | Ht 62.0 in | Wt 180.8 lb

## 2023-10-28 DIAGNOSIS — Z1732 Human epidermal growth factor receptor 2 negative status: Secondary | ICD-10-CM | POA: Diagnosis not present

## 2023-10-28 DIAGNOSIS — B369 Superficial mycosis, unspecified: Secondary | ICD-10-CM | POA: Insufficient documentation

## 2023-10-28 DIAGNOSIS — C50412 Malignant neoplasm of upper-outer quadrant of left female breast: Secondary | ICD-10-CM

## 2023-10-28 DIAGNOSIS — Z79810 Long term (current) use of selective estrogen receptor modulators (SERMs): Secondary | ICD-10-CM | POA: Insufficient documentation

## 2023-10-28 DIAGNOSIS — M545 Low back pain, unspecified: Secondary | ICD-10-CM | POA: Diagnosis not present

## 2023-10-28 DIAGNOSIS — Z1721 Progesterone receptor positive status: Secondary | ICD-10-CM | POA: Diagnosis not present

## 2023-10-28 DIAGNOSIS — Z170 Estrogen receptor positive status [ER+]: Secondary | ICD-10-CM | POA: Insufficient documentation

## 2023-10-28 LAB — CMP (CANCER CENTER ONLY)
ALT: 10 U/L (ref 0–44)
AST: 15 U/L (ref 15–41)
Albumin: 3.8 g/dL (ref 3.5–5.0)
Alkaline Phosphatase: 44 U/L (ref 38–126)
Anion gap: 4 — ABNORMAL LOW (ref 5–15)
BUN: 22 mg/dL (ref 8–23)
CO2: 30 mmol/L (ref 22–32)
Calcium: 9.2 mg/dL (ref 8.9–10.3)
Chloride: 106 mmol/L (ref 98–111)
Creatinine: 1.15 mg/dL — ABNORMAL HIGH (ref 0.44–1.00)
GFR, Estimated: 47 mL/min — ABNORMAL LOW (ref >60)
Glucose, Bld: 80 mg/dL (ref 70–99)
Potassium: 4.6 mmol/L (ref 3.5–5.1)
Sodium: 140 mmol/L (ref 135–145)
Total Bilirubin: 1.1 mg/dL (ref 0.0–1.2)
Total Protein: 6.4 g/dL — ABNORMAL LOW (ref 6.5–8.1)

## 2023-10-28 LAB — CBC WITH DIFFERENTIAL (CANCER CENTER ONLY)
Abs Immature Granulocytes: 0.03 10*3/uL (ref 0.00–0.07)
Basophils Absolute: 0.1 10*3/uL (ref 0.0–0.1)
Basophils Relative: 1 %
Eosinophils Absolute: 0.1 10*3/uL (ref 0.0–0.5)
Eosinophils Relative: 1 %
HCT: 42.6 % (ref 36.0–46.0)
Hemoglobin: 14.4 g/dL (ref 12.0–15.0)
Immature Granulocytes: 0 %
Lymphocytes Relative: 38 %
Lymphs Abs: 3.8 10*3/uL (ref 0.7–4.0)
MCH: 33.4 pg (ref 26.0–34.0)
MCHC: 33.8 g/dL (ref 30.0–36.0)
MCV: 98.8 fL (ref 80.0–100.0)
Monocytes Absolute: 0.8 10*3/uL (ref 0.1–1.0)
Monocytes Relative: 8 %
Neutro Abs: 5.1 10*3/uL (ref 1.7–7.7)
Neutrophils Relative %: 52 %
Platelet Count: 180 10*3/uL (ref 150–400)
RBC: 4.31 MIL/uL (ref 3.87–5.11)
RDW: 13.6 % (ref 11.5–15.5)
WBC Count: 10 10*3/uL (ref 4.0–10.5)
nRBC: 0 % (ref 0.0–0.2)

## 2023-10-28 MED ORDER — NYSTATIN 100000 UNIT/GM EX POWD: 1.0000 | Freq: Three times a day (TID) | CUTANEOUS | 1 refills | Status: AC

## 2023-10-28 NOTE — Progress Notes (Signed)
 [facility]   Telephone:([phone] Fax:([phone]   Clinic Follow up Note   Patient Care Team: Copland, Skipper Dumas, MD as PCP - General (Family Medicine) Boyce Byes, MD as PCP - Electrophysiology (Cardiology) Shirlee Dotter, MD (Inactive) (Orthopedic Surgery) Bonita Bussing, MD (Obstetrics and Gynecology) Sim Dryer, MD (General Surgery) Flavia Hughs, PA-C as Physician Assistant (Dermatology) Sim Dryer, MD as Consulting Physician (General Surgery) Sonja [place], MD as Consulting Physician (Hematology) Retta Caster, MD as Consulting Physician (Radiation Oncology) Alane Hsu, RN as Oncology Nurse Navigator Auther Bo, RN as Oncology Nurse Navigator Sharyon Deis, NP as Nurse Practitioner (Family Medicine)  Date of Service:  10/28/2023  CHIEF COMPLAINT: f/u of breast cancer  CURRENT THERAPY:  Adjuvant tamoxifen   Oncology History   Malignant neoplasm of upper-outer quadrant of left breast in female, estrogen receptor positive (HCC) pT1bN1aM0, stage IA, ER+/PR+/HER2-, G2 -She was diagnosed in February 2024 -She underwent left breast lumpectomy and sentinel lymph node biopsy, I discussed her surgical findings with patient in detail.  Surgical margins were negative -Adjuvant radiation was recommended and offered, however due to her pacemaker on the same side of her breast cancer, the ultimate decision was not to proceed with adjuvant radiation due to her pacemaker and her advanced age. -Due to her advanced age, she is not a candidate for chemotherapy, so Oncotype was not obtained. -She started adjuvant tamoxifen  in early June 2020, tolerating well overall, plan for 5 years  -continue cancer surveillance   Assessment & Plan Breast cancer Breast cancer is well-managed with no new symptoms. Recent mammogram in February or early March showed no concerning findings. She reports no breast pain and continues tamoxifen  therapy without  issues. Advised to maintain physical activity to mitigate the small risk of thromboembolism associated with tamoxifen . - Continue tamoxifen  therapy. - Encourage regular physical activity to reduce thromboembolism risk associated with tamoxifen .  Muscular back pain Muscular back pain in the left upper back, under the ribcage, persisting for 3-4 weeks. Pain is severe at times, causing crying, but not tender on examination. Pain is relieved by sitting and exacerbated by walking or bending over. Differential includes muscular pain versus metastatic disease. MRI is contraindicated due to pacemaker. Bone scan discussed as an alternative imaging modality to evaluate for possible metastatic disease. - Order bone scan to evaluate for possible metastatic disease. - Advise use of acetaminophen , naproxen, and heating pad for pain management. - Consider muscle relaxants if pain persists, though she is hesitant to use them. - Follow up with primary care physician if needed.  Fungal skin infection Fungal skin infection under the right axilla and inframammary areas, likely due to moisture accumulation. No pain reported. Advised to keep affected areas dry and use nystatin  powder. Deodorant use should be limited to the left axilla until infection clears. - Prescribe nystatin  powder for application to affected areas. - Advise keeping affected areas dry. - Instruct to use deodorant only on the left axilla until infection clears.  Plan - Will obtain a bone scan to rule out bone metastasis due to her back pain - Continue tamoxifen  - If bone scan negative, I will see her back in 6 months.   SUMMARY OF ONCOLOGIC HISTORY: Oncology History Overview Note   Cancer Staging  Malignant neoplasm of upper-outer quadrant of left breast in female, estrogen receptor positive (HCC) Staging form: Breast, AJCC 8th Edition - Clinical stage from 07/30/2022: Stage IB (cT1b, cN1, cM0, G2, ER+, PR+, HER2-) - Signed  by Sonja [employer], MD  on 08/07/2022 Stage prefix: Initial diagnosis Histologic grading system: 3 grade system - Pathologic stage from 09/11/2022: Stage IA (pT1b, pN1a, cM0, G2, ER+, PR+, HER2-) - Signed by Sonja Twiggs, MD on 09/24/2022 Stage prefix: Initial diagnosis Histologic grading system: 3 grade system Residual tumor (R): R0 - None     Malignant tumor of breast (HCC) (Resolved)  06/11/2009 Initial Diagnosis   Malignant tumor of breast (HCC)   Malignant neoplasm of upper-outer quadrant of left breast in female, estrogen receptor positive (HCC)  07/30/2022 Cancer Staging   Staging form: Breast, AJCC 8th Edition - Clinical stage from 07/30/2022: Stage IB (cT1b, cN1, cM0, G2, ER+, PR+, HER2-) - Signed by Sonja Loyola, MD on 08/07/2022 Stage prefix: Initial diagnosis Histologic grading system: 3 grade system   08/06/2022 Initial Diagnosis   Malignant neoplasm of upper-outer quadrant of left breast in female, estrogen receptor positive (HCC)   08/23/2022 Genetic Testing   Negative [facility] Common Hereditary Cancers +RNA Panel.  VUS detected in PMS2 at c.791A>C (p.His264Pro) and POLE at c.444G>C (p.Leu148Phe).  Report date is 08/23/2022.    The [facility] Common Hereditary Cancers + RNA Panel includes sequencing, deletion/duplication, and RNA analysis of the following 48 genes: APC, ATM, AXIN2, BAP1, BARD1, BMPR1A, BRCA1, BRCA2, BRIP1, CDH1, CDK4*, CDKN2A*, CHEK2, CTNNA1, DICER1, EPCAM* (del/dup only), FH, GREM1* (promoter dup analysis only), HOXB13*, KIT*, MBD4*, MEN1, MLH1, MSH2, MSH3, MSH6, MUTYH, NF1, NTHL1, PALB2, PDGFRA*, PMS2, POLD1, POLE, PTEN, RAD51C, RAD51D, SDHA (sequencing only), SDHB, SDHC, SDHD, SMAD4, SMARCA4, STK11, TP53, TSC1, TSC2, VHL.  *Genes without RNA analysis.    09/11/2022 Cancer Staging   Staging form: Breast, AJCC 8th Edition - Pathologic stage from 09/11/2022: Stage IA (pT1b, pN1a, cM0, G2, ER+, PR+, HER2-) - Signed by Sonja [employer], MD on 09/24/2022 Stage prefix: Initial diagnosis Histologic grading system:  3 grade system Residual tumor (R): R0 - None   09/11/2022 Pathology Results     FINAL MICROSCOPIC DIAGNOSIS:  A. BREAST, LEFT, LUMPECTOMY: Invasive ductal carcinoma, 0.8 cm, grade II/III Ductal carcinoma in situ: Not identified Margins, invasive: Negative     Closest, invasive: Posterior at 6 mm Margins, DCIS: N/A     Closest, DCIS: N/A Lymphovascular invasion: Suspicious for small arteriolar invasion Prognostic markers:  ER positive, PR positive, Her2 negative, Ki-67 20% % Other: N/A See oncology table  B. LYMPH NODE, LEFT AXILLA, SENTINEL, EXCISION: -  1 lymph node positive for malignancy, 18 mm in greatest dimension, with extracapsular extension (1/1).  C. LYMPH NODE, LEFT AXILLA, SENTINEL, EXCISION: -  1 lymph node positive for malignancy, 4 mm in greatest dimension (1/1).  D. LYMPH NODE, LEFT AXILLA, SENTINEL, EXCISION: -  1 lymph node negative for malignancy (0/1).  E. BREAST, LEFT ADDITIONAL MEDIAL MARGIN, EXCISION: -  Benign breast tissue, negative for malignancy. -  New additional margin thickness 7 mm.  F. BREAST, LEFT ADDITIONAL SUPERIOR MARGIN, EXCISION: -  Benign breast tissue, negative for malignancy. -  New additional margin thickness 13 mm.  G. BREAST, LEFT ADDITIONAL INFERIOR MARGIN, EXCISION: -  Benign breast tissue, negative for malignancy. -  New additional margin thickness 6 mm  H. BREAST, LEFT ADDITIONAL LATERAL MARGIN, EXCISION: -  Benign breast tissue, negative for malignancy. -  New additional margin thickness 5 mm.       Discussed the use of AI scribe software for clinical note transcription with the patient, who gave verbal consent to proceed.  History of Present Illness Sheila Schmidt is an 88 year  old female with breast cancer who presents for follow-up. She is accompanied by her daughter.  Her recent mammogram in February or early March showed good results. She continues tamoxifen  therapy without issues. She engages in regular  physical activity using a floor pedal machine and walking with support from a shopping cart.  She has experienced low back pain for the past three to four weeks, located on the left side under the ribcage. The pain worsens with walking and bending over and is alleviated by sitting. She rates the pain as severe at times, occasionally using Tylenol  for relief. Her daughter provides massages and applies doTERRA oils, which offer some relief.  She has developed a rash under her right armpit and under her breasts. She has been using a new deodorant but has not applied it to the affected areas. The rash is not painful.     All other systems were reviewed with the patient and are negative.  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   Dysrhythmia    Afib   Heart murmur    Hx of adenomatous colonic polyps    Hyperlipidemia    Presence of permanent cardiac pacemaker     SURGICAL HISTORY: Past Surgical History:  Procedure Laterality Date   ATRIAL FLUTTER ABLATION     BIV PACEMAKER INSERTION CRT-P N/A 12/04/2021   Procedure: BIV PACEMAKER INSERTION CRT-P;  Surgeon: Boyce Byes, MD;  Location: [facility] INVASIVE [facility] LAB;  Service: Cardiovascular;  Laterality: N/A;   BREAST BIOPSY Left 07/30/2022   US  LT BREAST BX W LOC DEV 1ST LESION IMG BX SPEC US  GUIDE 07/30/2022 [facility]MO[facility]   BREAST BIOPSY Left 09/10/2022   US  LT RADIOACTIVE SEED LOC 09/10/2022 [facility]AMMOGR[facility]   BREAST BIOPSY Left 09/10/2022   US  LT RADIOACTIVE SEED LOC 09/10/2022 [facility]AMMO[facility]   BREAST EXCISIONAL BIOPSY Left 2010   benign   BREAST LUMPECTOMY WITH RADIOACTIVE SEED AND SENTINEL LYMPH NODE BIOPSY Left 09/11/2022   Procedure: LEFT BREAST LUMPECTOMY WITH RADIOACTIVE SEED;  Surgeon: Sim Dryer, MD;  Location: [facility] OR;  Service: General;  Laterality: Left;    CARDIOVERSION  05/2020   in [facility]   CARDIOVERSION N/A 06/21/2021   Procedure: CARDIOVERSION;  Surgeon: Harrold Lincoln, MD;  Location: [facility] ENDOSCOPY;  Service: Cardiovascular;  Laterality: N/A;   CARDIOVERSION N/A 03/26/2022   Procedure: CARDIOVERSION;  Surgeon: Lenise Quince, MD;  Location: [facility] ENDOSCOPY;  Service: Cardiovascular;  Laterality: N/A;   CHOLECYSTECTOMY     CHOLECYSTECTOMY, LAPAROSCOPIC  '06   [name]   KNEE ARTHROSCOPY     Left '00 [place]/ Right '08   lumpectomy- remote     benign   RADIOACTIVE SEED GUIDED AXILLARY SENTINEL LYMPH NODE Left 09/11/2022   Procedure: RADIOACTIVE SEED GUIDED LEFT SENTINEL LYMPH NODE BIOPSY;  Surgeon: Sim Dryer, MD;  Location: [facility] OR;  Service: General;  Laterality: Left;   TOOTH EXTRACTION     TOTAL KNEE ARTHROPLASTY  02/05/2011   left    I have reviewed the social history and family history with the patient and they are unchanged from previous note.  ALLERGIES:  has no known allergies.  MEDICATIONS:  Current Outpatient Medications  Medication Sig Dispense Refill   acetaminophen  (TYLENOL ) 500 MG tablet Take 500-1,000 mg by mouth as needed for moderate  pain (pain score 4-6).     amiodarone  (PACERONE ) 100 MG tablet Take 1 tablet (100 mg total) by mouth daily. 90 tablet 2   atorvastatin  (LIPITOR) 20 MG tablet Take 1 tablet (20 mg total) by mouth daily. 90 tablet 1   calcium  carbonate (TUMS - DOSED IN MG ELEMENTAL CALCIUM ) 500 MG chewable tablet Chew 1 tablet by mouth as needed for indigestion or heartburn.     Calcium  Carbonate-Vit D-Min (CALTRATE 600+D PLUS MINERALS) 600-800 MG-UNIT CHEW Chew 1 each by mouth daily.     Cholecalciferol 50 MCG (2000 UT) TABS Take 2,000 Units by mouth daily.     famotidine  (PEPCID ) 40 MG tablet Take 40 mg by mouth daily as needed for heartburn or indigestion.     furosemide  (LASIX ) 20 MG tablet Take 20 mg by mouth as needed for fluid or edema.     guaiFENesin -dextromethorphan  (ROBITUSSIN DM) 100-10  MG/5ML syrup Take 10 mLs by mouth every 6 (six) hours as needed for cough. 118 mL 0   melatonin 5 MG TABS Take 1.25 mg by mouth at bedtime as needed (sleep).     nystatin  (MYCOSTATIN /NYSTOP ) powder Apply 1 Application topically 3 (three) times daily. TO skin rash at axilla or below breasts 60 g 1   rivaroxaban  (XARELTO ) 20 MG TABS tablet Take 1 tablet (20 mg total) by mouth daily with supper. 90 tablet 1   sacubitril -valsartan  (ENTRESTO ) 24-26 MG Take 1 tablet by mouth 2 (two) times daily. 180 tablet 1   tamoxifen  (NOLVADEX ) 20 MG tablet TAKE 1 TABLET BY MOUTH DAILY 90 tablet 3   venlafaxine  XR (EFFEXOR -XR) 37.5 MG 24 hr capsule Take 1 capsule (37.5 mg total) by mouth daily with breakfast. 90 capsule 1   vitamin B-12 (CYANOCOBALAMIN ) 100 MCG tablet Take 100 mcg by mouth daily.     No current facility-administered medications for this visit.    PHYSICAL EXAMINATION: ECOG PERFORMANCE STATUS: 2 - Symptomatic, <50% confined to bed  Vitals:   10/28/23 1304  BP: 120/60  Pulse: 97  Resp: 17  Temp: (!) 97.3 F (36.3 C)  SpO2: 94%   Wt Readings from Last 3 Encounters:  10/28/23 180 lb 12.8 oz (82 kg)  07/15/23 179 lb 6.4 oz (81.4 kg)  07/03/23 180 lb 12.4 oz (82 kg)     GENERAL:alert, no distress and comfortable SKIN: skin color, texture, turgor are normal, no rashes or significant lesions except skin redness in her right axilla and underneath breasts EYES: normal, Conjunctiva are pink and non-injected, sclera clear NECK: supple, thyroid  normal size, non-tender, without nodularity LYMPH:  no palpable lymphadenopathy in the cervical, axillary  LUNGS: clear to auscultation and percussion with normal breathing effort HEART: regular rate & rhythm and no murmurs and no lower extremity edema ABDOMEN:abdomen soft, non-tender and normal bowel sounds Musculoskeletal:no cyanosis of digits and no clubbing  NEURO: alert & oriented x 3 with fluent speech, no focal motor/sensory deficits  Physical  Exam   LABORATORY DATA:  I have reviewed the data as listed    Latest Ref Rng & Units 10/28/2023   12:43 PM 07/04/2023    5:56 AM 07/03/2023    6:13 AM  CBC  WBC 4.0 - 10.5 K/uL 10.0  9.6  14.0   Hemoglobin 12.0 - 15.0 g/dL 19.1  47.8  29.5   Hematocrit 36.0 - 46.0 % 42.6  39.0  40.4   Platelets 150 - 400 K/uL 180  153  167  Latest Ref Rng & Units 10/28/2023   12:43 PM 07/04/2023    5:56 AM 07/03/2023    6:13 AM  CMP  Glucose 70 - 99 mg/dL 80  87  161   BUN 8 - 23 mg/dL 22  17  23    Creatinine 0.44 - 1.00 mg/dL 0.96  0.45  4.09   Sodium 135 - 145 mmol/L 140  139  139   Potassium 3.5 - 5.1 mmol/L 4.6  3.9  4.1   Chloride 98 - 111 mmol/L 106  109  105   CO2 22 - 32 mmol/L 30  24  23    Calcium  8.9 - 10.3 mg/dL 9.2  8.6  9.3   Total Protein 6.5 - 8.1 g/dL 6.4     Total Bilirubin 0.0 - 1.2 mg/dL 1.1     Alkaline Phos 38 - 126 U/L 44     AST 15 - 41 U/L 15     ALT 0 - 44 U/L 10         RADIOGRAPHIC STUDIES: I have personally reviewed the radiological images as listed and agreed with the findings in the report. No results found.    Orders Placed This Encounter  Procedures   NM Bone Scan Whole Body    Standing Status:   Future    Expected Date:   11/04/2023    Expiration Date:   10/27/2024    If indicated for the ordered procedure, I authorize the administration of a radiopharmaceutical per Radiology protocol:   Yes    Preferred imaging location?:   [facility]   All questions were answered. The patient knows to call the clinic with any problems, questions or concerns. No barriers to learning was detected. The total time spent in the appointment was 30 minutes, including review of chart and various tests results, discussions about plan of care and coordination of care plan     Sonja Hudson, MD 10/28/2023

## 2023-10-29 ENCOUNTER — Encounter: Payer: Self-pay | Admitting: Family Medicine

## 2023-10-31 ENCOUNTER — Other Ambulatory Visit: Payer: Self-pay

## 2023-11-01 ENCOUNTER — Encounter (HOSPITAL_COMMUNITY)
Admission: RE | Admit: 2023-11-01 | Discharge: 2023-11-01 | Disposition: A | Discharge status: Home / Self Care | Source: Ambulatory Visit | Attending: Hematology | Admitting: Hematology

## 2023-11-01 DIAGNOSIS — C50412 Malignant neoplasm of upper-outer quadrant of left female breast: Secondary | ICD-10-CM | POA: Diagnosis present

## 2023-11-01 DIAGNOSIS — Z170 Estrogen receptor positive status [ER+]: Secondary | ICD-10-CM | POA: Insufficient documentation

## 2023-11-01 MED ORDER — TECHNETIUM TC 99M MEDRONATE IV KIT: 20.1000 | PACK | Freq: Once | INTRAVENOUS | Status: DC

## 2023-11-05 ENCOUNTER — Telehealth: Payer: Self-pay | Admitting: Hematology

## 2023-11-05 ENCOUNTER — Encounter: Payer: Self-pay | Admitting: Hematology

## 2023-11-08 ENCOUNTER — Inpatient Hospital Stay: Admitting: Hematology

## 2023-11-08 DIAGNOSIS — C50412 Malignant neoplasm of upper-outer quadrant of left female breast: Secondary | ICD-10-CM

## 2023-11-08 DIAGNOSIS — Z170 Estrogen receptor positive status [ER+]: Secondary | ICD-10-CM | POA: Diagnosis not present

## 2023-11-08 NOTE — Assessment & Plan Note (Signed)
 pT1bN1aM0, stage IA, ER+/PR+/HER2-, G2 -She was diagnosed in February 2024 -She underwent left breast lumpectomy and sentinel lymph node biopsy, I discussed her surgical findings with patient in detail.  Surgical margins were negative -Adjuvant radiation was recommended and offered, however due to her pacemaker on the same side of her breast cancer, the ultimate decision was not to proceed with adjuvant radiation due to her pacemaker and her advanced age. -Due to her advanced age, she is not a candidate for chemotherapy, so Oncotype was not obtained. -She started adjuvant tamoxifen  in early June 2020, tolerating well overall, plan for 5 years  -Due to her new onset back pain, we obtained bone scan in May 2025 which was negative -continue cancer surveillance

## 2023-11-08 NOTE — Progress Notes (Signed)
  [facility]   [phone] Fax:([phone]) [phone]   Clinic Follow up Note   Patient Care Team: Copland, Skipper Dumas, MD as PCP - General (Family Medicine) Boyce Byes, MD as PCP - Electrophysiology (Cardiology) Shirlee Dotter, MD (Inactive) (Orthopedic Surgery) Bonita Bussing, MD (Obstetrics and Gynecology) Sim Dryer, MD (General Surgery) Flavia Hughs, PA-C as Physician Assistant (Dermatology) Sim Dryer, MD as Consulting Physician (General Surgery) Sonja Phelan, MD as Consulting Physician (Hematology) Retta Caster, MD as Consulting Physician (Radiation Oncology) Alane Hsu, RN as Oncology Nurse Navigator Auther Bo, RN as Oncology Nurse Navigator Sharyon Deis, NP as Nurse Practitioner (Family Medicine) 11/08/2023  I connected with Sheila Schmidt on 11/08/23 at 10:00 AM EDT by telephone and verified that I am speaking with the correct person using two identifiers.   I discussed the limitations, risks, security and privacy concerns of performing an evaluation and management service by telephone and the availability of in person appointments. I also discussed with the patient that there may be a patient responsible charge related to this service. The patient expressed understanding and agreed to proceed.   Patient's location:  Vacation home  Provider's location:  Office    CHIEF COMPLAINT: f/u scan result    CURRENT THERAPY: Tamoxifen    Oncology history Malignant neoplasm of upper-outer quadrant of left breast in female, estrogen receptor positive (HCC) pT1bN1aM0, stage IA, ER+/PR+/HER2-, G2 -She was diagnosed in February 2024 -She underwent left breast lumpectomy and sentinel lymph node biopsy, I discussed her surgical findings with patient in detail.  Surgical margins were negative -Adjuvant radiation was recommended and offered, however due to her pacemaker on the same side of her breast cancer, the ultimate decision was  not to proceed with adjuvant radiation due to her pacemaker and her advanced age. -Due to her advanced age, she is not a candidate for chemotherapy, so Oncotype was not obtained. -She started adjuvant tamoxifen  in early June 2020, tolerating well overall, plan for 5 years  -Due to her new onset back pain, we obtained bone scan in May 2025 which was negative -continue cancer surveillance   Assessment & Plan Breast cancer No evidence of recurrence in the bones as per recent bone scan. Back pain is unrelated to breast cancer.  Back pain Chronic back pain with no evidence of metastasis from breast cancer. Bone scan is negative, confirming the pain is not cancer-related. Pain persists but is manageable with Tylenol . - Administer Tylenol  for pain management as needed. - Apply a heating pad for symptomatic relief. - Consult with primary care physician for further evaluation and potential medication adjustment.  Plan -I reviewed her bone scan which is negative, reviewed with pt and her daughter  -f/u in 6 months    SUMMARY OF ONCOLOGIC HISTORY: Oncology History Overview Note   Cancer Staging  Malignant neoplasm of upper-outer quadrant of left breast in female, estrogen receptor positive (HCC) Staging form: Breast, AJCC 8th Edition - Clinical stage from 07/30/2022: Stage IB (cT1b, cN1, cM0, G2, ER+, PR+, HER2-) - Signed by Sonja Temelec, MD on 08/07/2022 Stage prefix: Initial diagnosis Histologic grading system: 3 grade system - Pathologic stage from 09/11/2022: Stage IA (pT1b, pN1a, cM0, G2, ER+, PR+, HER2-) - Signed by Sonja Creswell, MD on 09/24/2022 Stage prefix: Initial diagnosis Histologic grading system: 3 grade system Residual tumor (R): R0 - None     Malignant tumor of breast (HCC) (Resolved)  06/11/2009 Initial Diagnosis   Malignant tumor of breast (HCC)  Malignant neoplasm of upper-outer quadrant of left breast in female, estrogen receptor positive (HCC)  07/30/2022 Cancer Staging    Staging form: Breast, AJCC 8th Edition - Clinical stage from 07/30/2022: Stage IB (cT1b, cN1, cM0, G2, ER+, PR+, HER2-) - Signed by Sonja Menard, MD on 08/07/2022 Stage prefix: Initial diagnosis Histologic grading system: 3 grade system   08/06/2022 Initial Diagnosis   Malignant neoplasm of upper-outer quadrant of left breast in female, estrogen receptor positive (HCC)   08/23/2022 Genetic Testing   Negative [facility] Common Hereditary Cancers +RNA Panel.  VUS detected in PMS2 at c.791A>C (p.His264Pro) and POLE at c.444G>C (p.Leu148Phe).  Report date is 08/23/2022.    The [facility] Common Hereditary Cancers + RNA Panel includes sequencing, deletion/duplication, and RNA analysis of the following 48 genes: APC, ATM, AXIN2, BAP1, BARD1, BMPR1A, BRCA1, BRCA2, BRIP1, CDH1, CDK4*, CDKN2A*, CHEK2, CTNNA1, DICER1, EPCAM* (del/dup only), FH, GREM1* (promoter dup analysis only), HOXB13*, KIT*, MBD4*, MEN1, MLH1, MSH2, MSH3, MSH6, MUTYH, NF1, NTHL1, PALB2, PDGFRA*, PMS2, POLD1, POLE, PTEN, RAD51C, RAD51D, SDHA (sequencing only), SDHB, SDHC, SDHD, SMAD4, SMARCA4, STK11, TP53, TSC1, TSC2, VHL.  *Genes without RNA analysis.    09/11/2022 Cancer Staging   Staging form: Breast, AJCC 8th Edition - Pathologic stage from 09/11/2022: Stage IA (pT1b, pN1a, cM0, G2, ER+, PR+, HER2-) - Signed by Sonja [place], MD on 09/24/2022 Stage prefix: Initial diagnosis Histologic grading system: 3 grade system Residual tumor (R): R0 - None   09/11/2022 Pathology Results     FINAL MICROSCOPIC DIAGNOSIS:  A. BREAST, LEFT, LUMPECTOMY: Invasive ductal carcinoma, 0.8 cm, grade II/III Ductal carcinoma in situ: Not identified Margins, invasive: Negative     Closest, invasive: Posterior at 6 mm Margins, DCIS: N/A     Closest, DCIS: N/A Lymphovascular invasion: Suspicious for small arteriolar invasion Prognostic markers:  ER positive, PR positive, Her2 negative, Ki-67 20% % Other: N/A See oncology table  B. LYMPH NODE, LEFT AXILLA, SENTINEL,  EXCISION: -  1 lymph node positive for malignancy, 18 mm in greatest dimension, with extracapsular extension (1/1).  C. LYMPH NODE, LEFT AXILLA, SENTINEL, EXCISION: -  1 lymph node positive for malignancy, 4 mm in greatest dimension (1/1).  D. LYMPH NODE, LEFT AXILLA, SENTINEL, EXCISION: -  1 lymph node negative for malignancy (0/1).  E. BREAST, LEFT ADDITIONAL MEDIAL MARGIN, EXCISION: -  Benign breast tissue, negative for malignancy. -  New additional margin thickness 7 mm.  F. BREAST, LEFT ADDITIONAL SUPERIOR MARGIN, EXCISION: -  Benign breast tissue, negative for malignancy. -  New additional margin thickness 13 mm.  G. BREAST, LEFT ADDITIONAL INFERIOR MARGIN, EXCISION: -  Benign breast tissue, negative for malignancy. -  New additional margin thickness 6 mm  H. BREAST, LEFT ADDITIONAL LATERAL MARGIN, EXCISION: -  Benign breast tissue, negative for malignancy. -  New additional margin thickness 5 mm.      Discussed the use of AI scribe software for clinical note transcription with the patient, who gave verbal consent to proceed.  History of Present Illness Sheila Schmidt is an 88 year old female with breast cancer who presents with back pain. She is accompanied by her daughter, Sheila Schmidt.  She experiences persistent back pain. A bone scan was performed to rule out recurrence of breast cancer, and the results were negative. She does not take pain medication due to a dislike of it but finds Tylenol  acceptable. She also uses a heating pad for relief.     REVIEW OF SYSTEMS:   Constitutional: Denies fevers, chills or  abnormal weight loss Eyes: Denies blurriness of vision Ears, nose, mouth, throat, and face: Denies mucositis or sore throat Respiratory: Denies cough, dyspnea or wheezes Cardiovascular: Denies palpitation, chest discomfort or lower extremity swelling Gastrointestinal:  Denies nausea, heartburn or change in bowel habits Skin: Denies abnormal skin  rashes Lymphatics: Denies new lymphadenopathy or easy bruising Neurological:Denies numbness, tingling or new weaknesses Behavioral/Psych: Mood is stable, no new changes  All other systems were reviewed with the patient and are negative.  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   Dysrhythmia    Afib   Heart murmur    Hx of adenomatous colonic polyps    Hyperlipidemia    Presence of permanent cardiac pacemaker     SURGICAL HISTORY: Past Surgical History:  Procedure Laterality Date   ATRIAL FLUTTER ABLATION     BIV PACEMAKER INSERTION CRT-P N/A 12/04/2021   Procedure: BIV PACEMAKER INSERTION CRT-P;  Surgeon: Boyce Byes, MD;  Location: [facility];  Service: Cardiovascular;  Laterality: N/A;   BREAST BIOPSY Left 07/30/2022   US  LT BREAST BX W LOC DEV 1ST LESION IMG BX SPEC US  GUIDE 07/30/2022 [facility]   BREAST BIOPSY Left 09/10/2022   US  LT RADIOACTIVE SEED LOC 09/10/2022 [facility]   BREAST BIOPSY Left 09/10/2022   US  LT RADIOACTIVE SEED LOC 09/10/2022 [facility]   BREAST EXCISIONAL BIOPSY Left 2010   benign   BREAST LUMPECTOMY WITH RADIOACTIVE SEED AND SENTINEL LYMPH NODE BIOPSY Left 09/11/2022   Procedure: LEFT BREAST LUMPECTOMY WITH RADIOACTIVE SEED;  Surgeon: Sim Dryer, MD;  Location: [facility] OR;  Service: General;  Laterality: Left;   CARDIOVERSION  05/2020   in [facility]   CARDIOVERSION N/A 06/21/2021   Procedure: CARDIOVERSION;  Surgeon: Harrold Lincoln, MD;  Location[facility] END[facility]COPY;  Service: Cardiovascular;  Laterality: N/A;   CARDIOVERSION N/A 03/26/2022   Procedure: CARDIOVERSION;  Surgeon: Lenise Quince, MD;  Location: [facility] ENDOSCOPY;  Service: Cardiovascular;  Laterality: N/A;   CHOLECYSTECTOMY     CHOLECYSTECTOMY, LAPAROSCOPIC  '06   [name]   KNEE ARTHROSCOPY     Left '00 [place]/  Right '08   lumpectomy- remote     benign   RADIOACTIVE SEED GUIDED AXILLARY SENTINEL LYMPH NODE Left 09/11/2022   Procedure: RADIOACTIVE SEED GUIDED LEFT SENTINEL LYMPH NODE BIOPSY;  Surgeon: Sim Dryer, MD;  Location: [facility] OR;  Service: General;  Laterality: Left;   TOOTH EXTRACTION     TOTAL KNEE ARTHROPLASTY  02/05/2011   left    I have reviewed the social history and family history with the patient and they are unchanged from previous note.  ALLERGIES:  has no known allergies.  MEDICATIONS:  Current Outpatient Medications  Medication Sig Dispense Refill   acetaminophen  (TYLENOL ) 500 MG tablet Take 500-1,000 mg by mouth as needed for moderate pain (pain score 4-6).     amiodarone  (PACERONE ) 100 MG tablet Take 1 tablet (100 mg total) by mouth daily. 90 tablet 2   atorvastatin  (LIPITOR) 20 MG tablet Take 1 tablet (20 mg total) by mouth daily. 90 tablet 1   calcium  carbonate (TUMS - DOSED IN MG ELEMENTAL CALCIUM ) 500 MG chewable tablet Chew 1 tablet by mouth as needed for indigestion or heartburn.     Calcium  Carbonate-Vit D-Min (CALTRATE 600+D PLUS MINERALS)  600-800 MG-UNIT CHEW Chew 1 each by mouth daily.     Cholecalciferol 50 MCG (2000 UT) TABS Take 2,000 Units by mouth daily.     famotidine  (PEPCID ) 40 MG tablet Take 40 mg by mouth daily as needed for heartburn or indigestion.     furosemide  (LASIX ) 20 MG tablet Take 20 mg by mouth as needed for fluid or edema.     guaiFENesin -dextromethorphan  (ROBITUSSIN DM) 100-10 MG/5ML syrup Take 10 mLs by mouth every 6 (six) hours as needed for cough. 118 mL 0   melatonin 5 MG TABS Take 1.25 mg by mouth at bedtime as needed (sleep).     nystatin  (MYCOSTATIN /NYSTOP ) powder Apply 1 Application topically 3 (three) times daily. TO skin rash at axilla or below breasts 60 g 1   rivaroxaban  (XARELTO ) 20 MG TABS tablet Take 1 tablet (20 mg total) by mouth daily with supper. 90 tablet 1   sacubitril -valsartan  (ENTRESTO ) 24-26 MG Take 1 tablet by mouth  2 (two) times daily. 180 tablet 1   tamoxifen  (NOLVADEX ) 20 MG tablet TAKE 1 TABLET BY MOUTH DAILY 90 tablet 3   venlafaxine  XR (EFFEXOR -XR) 37.5 MG 24 hr capsule Take 1 capsule (37.5 mg total) by mouth daily with breakfast. 90 capsule 1   vitamin B-12 (CYANOCOBALAMIN ) 100 MCG tablet Take 100 mcg by mouth daily.     No current facility-administered medications for this visit.    PHYSICAL EXAMINATION: Not performed   LABORATORY DATA:  I have reviewed the data as listed    Latest Ref Rng & Units 10/28/2023   12:43 PM 07/04/2023    5:56 AM 07/03/2023    6:13 AM  CBC  WBC 4.0 - 10.5 K/uL 10.0  9.6  14.0   Hemoglobin 12.0 - 15.0 g/dL 74.2  59.5  63.8   Hematocrit 36.0 - 46.0 % 42.6  39.0  40.4   Platelets 150 - 400 K/uL 180  153  167         Latest Ref Rng & Units 10/28/2023   12:43 PM 07/04/2023    5:56 AM 07/03/2023    6:13 AM  CMP  Glucose 70 - 99 mg/dL 80  87  756   BUN 8 - 23 mg/dL 22  17  23    Creatinine 0.44 - 1.00 mg/dL 4.33  2.95  1.88   Sodium 135 - 145 mmol/L 140  139  139   Potassium 3.5 - 5.1 mmol/L 4.6  3.9  4.1   Chloride 98 - 111 mmol/L 106  109  105   CO2 22 - 32 mmol/L 30  24  23    Calcium  8.9 - 10.3 mg/dL 9.2  8.6  9.3   Total Protein 6.5 - 8.1 g/dL 6.4     Total Bilirubin 0.0 - 1.2 mg/dL 1.1     Alkaline Phos 38 - 126 U/L 44     AST 15 - 41 U/L 15     ALT 0 - 44 U/L 10         RADIOGRAPHIC STUDIES: I have personally reviewed the radiological images as listed and agreed with the findings in the report. No results found.     I discussed the assessment and treatment plan with the patient. The patient was provided an opportunity to ask questions and all were answered. The patient agreed with the plan and demonstrated an understanding of the instructions.   The patient was advised to call back or seek an in-person evaluation if the symptoms worsen or  if the condition fails to improve as anticipated.  I provided 15 minutes of non face-to-face telephone  visit time during this encounter, including review of chart and various tests results, discussions about plan of care and coordination of care plan.    Sonja Stem, MD 11/08/23

## 2023-11-11 ENCOUNTER — Other Ambulatory Visit: Payer: Self-pay | Admitting: Cardiology

## 2023-11-11 ENCOUNTER — Other Ambulatory Visit: Payer: Self-pay | Admitting: Family Medicine

## 2023-11-15 ENCOUNTER — Encounter: Payer: Self-pay | Admitting: Physician Assistant

## 2023-11-15 ENCOUNTER — Ambulatory Visit: Payer: Self-pay

## 2023-11-15 ENCOUNTER — Ambulatory Visit (HOSPITAL_BASED_OUTPATIENT_CLINIC_OR_DEPARTMENT_OTHER)
Admission: RE | Admit: 2023-11-15 | Discharge: 2023-11-15 | Disposition: A | Discharge status: Home / Self Care | Source: Ambulatory Visit | Attending: Physician Assistant | Admitting: Physician Assistant

## 2023-11-15 ENCOUNTER — Ambulatory Visit: Admitting: Physician Assistant

## 2023-11-15 ENCOUNTER — Ambulatory Visit: Payer: Self-pay | Admitting: Physician Assistant

## 2023-11-15 VITALS — BP 93/60 | HR 71 | Ht 62.0 in | Wt 183.2 lb

## 2023-11-15 DIAGNOSIS — M25511 Pain in right shoulder: Secondary | ICD-10-CM

## 2023-11-15 DIAGNOSIS — W19XXXA Unspecified fall, initial encounter: Secondary | ICD-10-CM

## 2023-11-15 MED ORDER — LIDOCAINE 5 % EX PTCH: 1.0000 | MEDICATED_PATCH | CUTANEOUS | 0 refills | Status: AC

## 2023-11-15 NOTE — Telephone Encounter (Signed)
 FYI Only or Action Required?: FYI only for provider  Patient was last seen in primary care on 07/15/2023 by Copland, Skipper Dumas, MD. Called Nurse Triage reporting Fall. Symptoms began several days ago. Interventions attempted: OTC medications: Tylenol , Rest, hydration, or home remedies, and Ice/heat application. Symptoms are: gradually worsening.  Triage Disposition: See HCP Within 4 Hours (Or PCP Triage)  Patient/caregiver understands and will follow disposition?: Yes  Copied from CRM [id number]. Topic: Clinical - Red Word Triage >> Nov 15, 2023 11:42 AM Allyne Areola wrote: Red Word that prompted transfer to Nurse Triage: Marvell Slider down on June 1st on her right side. Have bruising on chest, arm, leg, and knee. Office called patient to inform she needed to triage. Reason for Disposition  [1] MODERATE weakness (i.e., interferes with work, school, normal activities) AND [2] new-onset or worsening  Answer Assessment - Initial Assessment Questions 1. MECHANISM: "How did the fall happen?"     Patient was in a hotel room-was going from tile floor to carpet and foot got caught and lost her balance. Fell on right side 2. DOMESTIC VIOLENCE AND ELDER ABUSE SCREENING: "Did you fall because someone pushed you or tried to hurt you?" If Yes, ask: "Are you safe now?"     No 3. ONSET: "When did the fall happen?" (e.g., minutes, hours, or days ago)     Occurred on November 10, 2023 4. LOCATION: "What part of the body hit the ground?" (e.g., back, buttocks, head, hips, knees, hands, head, stomach)     Right side of body-arm, chest, leg and knee 5. INJURY: "Did you hurt (injure) yourself when you fell?" If Yes, ask: "What did you injure? Tell me more about this?" (e.g., body area; type of injury; pain severity)"     Bruising to right arm, chest, leg and knee 6. PAIN: "Is there any pain?" If Yes, ask: "How bad is the pain?" (e.g., Scale 1-10; or mild,  moderate, severe)   - NONE (0): No pain   - MILD (1-3): Doesn't interfere with  normal activities    - MODERATE (4-7): Interferes with normal activities or awakens from sleep    - SEVERE (8-10): Excruciating pain, unable to do any normal activities      6-7 out of 10 7. SIZE: For cuts, bruises, or swelling, ask: "How large is it?" (e.g., inches or centimeters)      Bruising located to the right side of body. 9. OTHER SYMPTOMS: "Do you have any other symptoms?" (e.g., dizziness, fever, weakness; new onset or worsening).      no 10. CAUSE: "What do you think caused the fall (or falling)?" (e.g., tripped, dizzy spell)       Tripped  Patient and daughter were at [facility] at the time. Patient does take Xarleto. Reports bruising to right side of body. Daughter states she couldn't get up from the floor so she called for assistance from the front desk. The casino has an EMT and RN on staff. 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  Patient reports having difficulty with range of motion to right shoulder which started yesterday.  Protocols used: Falls and [facility]

## 2023-11-15 NOTE — Progress Notes (Signed)
 [facility] [address], [address] [place], [place] [address] [phone]  Date:  11/20/2023   Name:  Sheila Schmidt   DOB:  1936-04-02   MRN:  [id number]  PCP:  Kaylee Partridge, MD    Chief Complaint: Back Pain (Back pain onset its been so long I can't remember /Back pain before the fall)   History of Present Illness:  Sheila Schmidt is a 88 y.o. very pleasant female patient who presents with the following:  Patient seen today with concern of a recent fall and chronic back pain.  Seen today with her daughter Amalia Badder I last saw her in February when she had recently been in the hospital overnight with shortness of breath History of nonischemic cardiomyopathy with pacemaker placement in June 2023,also history of atrial fibrillation, hyperlipidemia, chronic knee pain from arthritis, osteopenia, breast cancer LEFT   She was found to have recurrent breast cancer 2024, had a lumpectomy in early April 2024 with a limited node dissection  Since our last visit she saw her oncologist, Dr. Maryalice Smaller about 2 weeks ago [id number], stage IA, ER+/PR+/HER2-, G2 -She was diagnosed in February 2024 -She underwent left breast lumpectomy and sentinel lymph node biopsy, I discussed her surgical findings with patient in detail.  Surgical margins were negative -Adjuvant radiation was recommended and offered, however due to her pacemaker on the same side of her breast cancer, the ultimate decision was not to proceed with adjuvant radiation due to her pacemaker and her advanced age. -Due to her advanced age, she is not a candidate for chemotherapy, so Oncotype was not obtained. -She started adjuvant tamoxifen  in early June 2020, tolerating well overall, plan for 5 years  -Due to her new onset back pain, we obtained bone scan in May 2025 which was negative -continue cancer surveillance   She saw my partner Trenton Frock last week after a fall at a hotel  bathroom EMS had to help her get up Her main issue was right shoulder pain-x-rays obtained and Ortho referral was placed Films are negative for fracture He has not seen orthopedics as of yet  She she was using Tylenol  for pain with some success.  However a few nights ago she had a bad nightmare and thought it might be related to Tylenol  so she stopped using it  She notes she is having trouble getting comfortable with back pain which is mostly located in the left sided paraspinous muscles of the thoracolumbar junction-this does predate her fall  We note her BP is low, but she is not feeling lightheaded.  She does feel fatigued She is not generally taking her lasix  because she usually does not need it for swelling   BP Readings from Last 3 Encounters:  11/20/23 106/72  11/15/23 93/60  10/28/23 120/60    Patient Active Problem List   Diagnosis Date Noted   Acute respiratory failure with hypoxia (HCC) 07/03/2023   Acute hypoxic respiratory failure (HCC) 07/03/2023   Hypertensive disorder 01/15/2023   Dyslipidemia 01/15/2023   Genetic testing 09/18/2022   Pacemaker Abbott device BiV 09/06/2022   Malignant neoplasm of upper-outer quadrant of left breast in female, estrogen receptor positive (HCC) 08/06/2022   Pure hypercholesterolemia 05/15/2022   Skin cancer 03/07/2022   Closed nondisplaced fracture of distal phalanx of right thumb 02/15/2022   Cancer (HCC) 05/25/2021   Left bundle branch block 04/12/2021   Dilated cardiomyopathy (HCC) 04/12/2021   Vasculitis (HCC)  02/22/2021   Anxiety state 02/12/2021   Body fluid retention 02/12/2021   Cervical intraepithelial neoplasia 02/12/2021   Insomnia 02/12/2021   Pruritus of vulva 02/12/2021   Leg wound, right 10/03/2020   Bilateral primary osteoarthritis of knee 03/30/2019   Paroxysmal atrial fibrillation (HCC) 04/07/2015   Chronic anticoagulation 04/07/2015   Paroxysmal atrial flutter (HCC) 03/16/2015   Essential hypertension,  benign 02/24/2014   Sciatica 12/08/2013   Hyperlipidemia 09/25/2013   Osteopenia 11/19/2012   Low back pain 01/26/2010   Carcinoma in situ of breast 09/04/2009   DEPRESSION, PROLONGED 09/04/2009   OA (osteoarthritis) of knee 09/04/2009   PERIPHERAL EDEMA 09/04/2009   History of colonic polyps 09/04/2009    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   Dysrhythmia    Afib   Heart murmur    Hx of adenomatous colonic polyps    Hyperlipidemia    Presence of permanent cardiac pacemaker     Past Surgical History:  Procedure Laterality Date   ATRIAL FLUTTER ABLATION     BIV PACEMAKER INSERTION CRT-P N/A 12/04/2021   Procedure: BIV PACEMAKER INSERTION CRT-P;  Surgeon: Boyce Byes, MD;  Location: [facility] INVASIVE CV LAB;  Service: Cardiovascular;  Laterality: N/A;   BREAST BIOPSY Left 07/30/2022   US  LT BREAST BX W LOC DEV 1ST LESION IMG BX SPEC US  GUIDE 07/30/2022 [facility] M[facility]MOGRAPHY   BREAST BIOPSY Left 09/10/2022   US  LT RADIOACTIVE SEED LOC 09/10/2022 [facility]MO[facility]   BREAST BIOPSY Left 09/10/2022   US  LT RADIOACTIVE SEED LOC 09/10/2022 [facility] MAMMO[facility]   BREAST EXCISIONAL BIOPSY Left 2010   benign   BREAST LUMPECTOMY WITH RADIOACTIVE SEED AND SENTINEL LYMPH NODE BIOPSY Left 09/11/2022   Procedure: LEFT BREAST LUMPECTOMY WITH RADIOACTIVE SEED;  Surgeon: Sim Dryer, MD;  Location: [facility] OR;  Service: General;  Laterality: Left;   CARDIOVERSION  05/2020   in [facility]   CARDIOVERSION N/A 06/21/2021   Procedure: CARDIOVERSION;  Surgeon: Harrold Lincoln, MD;  Location: [facility] ENDOSCOPY;  Service: Cardiovascular;  Laterality: N/A;   CARDIOVERSION N/A 03/26/2022   Procedure: CARDIOVERSION;  Surgeon: Lenise Quince, MD;  Location: [facility] ENDOSCOPY;  Service: Cardiovascular;  Laterality: N/A;   CHOLECYSTECTOMY     CHOLECYSTECTOMY, LAPAROSCOPIC  '06   [name]    KNEE ARTHROSCOPY     Left '00 [place]/ Right '08   lumpectomy- remote     benign   RADIOACTIVE SEED GUIDED AXILLARY SENTINEL LYMPH NODE Left 09/11/2022   Procedure: RADIOACTIVE SEED GUIDED LEFT SENTINEL LYMPH NODE BIOPSY;  Surgeon: Sim Dryer, MD;  Location: [facility] OR;  Service: General;  Laterality: Left;   TOOTH EXTRACTION     TOTAL KNEE ARTHROPLASTY  02/05/2011   left    Social History   Tobacco Use   Smoking status: Never    Passive exposure: Never   Smokeless tobacco: Never  Vaping Use   Vaping status: Never Used  Substance Use Topics   Alcohol use: Yes    Comment: 1 glass per day   Drug use: No    Family History  Problem Relation Age of Onset   Diabetes Mother        Deceased in 30s   Heart disease Mother        cad/MI-fatal   Heart attack Mother    Liver cancer  Father 45   Heart disease Sister    Breast cancer Neg Hx    Colon cancer Neg Hx     No Known Allergies  Medication list has been reviewed and updated.  Current Outpatient Medications on File Prior to Visit  Medication Sig Dispense Refill   acetaminophen  (TYLENOL ) 500 MG tablet Take 500-1,000 mg by mouth as needed for moderate pain (pain score 4-6).     amiodarone  (PACERONE ) 100 MG tablet TAKE 1 TABLET BY MOUTH DAILY 90 tablet 1   atorvastatin  (LIPITOR) 20 MG tablet Take 1 tablet (20 mg total) by mouth daily. 90 tablet 1   calcium  carbonate (TUMS - DOSED IN MG ELEMENTAL CALCIUM ) 500 MG chewable tablet Chew 1 tablet by mouth as needed for indigestion or heartburn.     Calcium  Carbonate-Vit D-Min (CALTRATE 600+D PLUS MINERALS) 600-800 MG-UNIT CHEW Chew 1 each by mouth daily.     Cholecalciferol 50 MCG (2000 UT) TABS Take 2,000 Units by mouth daily.     famotidine  (PEPCID ) 40 MG tablet Take 40 mg by mouth daily as needed for heartburn or indigestion.     furosemide  (LASIX ) 20 MG tablet Take 20 mg by mouth as needed for fluid or edema.     guaiFENesin -dextromethorphan  (ROBITUSSIN DM) 100-10 MG/5ML syrup  Take 10 mLs by mouth every 6 (six) hours as needed for cough. 118 mL 0   lidocaine  (LIDODERM ) 5 % Place 1 patch onto the skin daily. Remove & Discard patch within 12 hours or as directed by MD 15 patch 0   melatonin 5 MG TABS Take 1.25 mg by mouth at bedtime as needed (sleep).     nystatin  (MYCOSTATIN /NYSTOP ) powder Apply 1 Application topically 3 (three) times daily. TO skin rash at axilla or below breasts 60 g 1   rivaroxaban  (XARELTO ) 20 MG TABS tablet Take 1 tablet (20 mg total) by mouth daily with supper. 90 tablet 1   sacubitril -valsartan  (ENTRESTO ) 24-26 MG Take 1 tablet by mouth 2 (two) times daily. 180 tablet 1   tamoxifen  (NOLVADEX ) 20 MG tablet TAKE 1 TABLET BY MOUTH DAILY 90 tablet 3   venlafaxine  XR (EFFEXOR -XR) 37.5 MG 24 hr capsule Take 1 capsule (37.5 mg total) by mouth daily with breakfast. 90 capsule 0   vitamin B-12 (CYANOCOBALAMIN ) 100 MCG tablet Take 100 mcg by mouth daily.     No current facility-administered medications on file prior to visit.    Review of Systems:  As per HPI- otherwise negative.   Physical Examination: Vitals:   11/20/23 1026  BP: 106/72  Pulse: 69  SpO2: 96%   Vitals:   11/20/23 1026  Weight: 183 lb (83 kg)  Height: 5' 2 (1.575 m)   Body mass index is 33.47 kg/m. Ideal Body Weight: Weight in (lb) to have BMI = 25: 136.4  GEN: no acute distress.  Obese, looks well HEENT: Atraumatic, Normocephalic.  Ears and Nose: No external deformity. CV: RRR, No M/G/R. No JVD. No thrill. No extra heart sounds. PULM: CTA B, no wheezes, crackles, rhonchi. No retractions. No resp. distress. No accessory muscle use. EXTR: No c/c/e PSYCH: Normally interactive. Conversant.  She has bruising of her right upper arm and right chest from her recent fall I am able to reproduce tenderness in her back by pressing on the left-sided paraspinous muscles at the lower lumbar/upper thoracic level  Assessment and Plan: Acute pain of right shoulder - Plan:  Ambulatory referral to Physical Therapy, CANCELED: Ambulatory referral to Physical Therapy  Chronic  bilateral low back pain without sciatica  Patient seen today with concern of right-sided shoulder pain from a recent fall.  She has not yet heard from orthopedics.  However as she has no fracture I think right now the greatest danger is stiffness from lack of use.  Made referral to physical therapy to work on her shoulder range of motion  I also asked physical therapy to look at her back and assist in any way that they can.  I encouraged her to give Tylenol  another try, I do not think nightmares will be a chronic problem  I offered to give her something stronger for pain but at this time she declines Also can try over-the-counter lidocaine  patches  Signed Gates Kasal, MD

## 2023-11-15 NOTE — Progress Notes (Signed)
 Established patient visit   Patient: Sheila Schmidt   DOB: [birth year]   88 y.o. Female  MRN: [id number] Visit Date: 11/15/2023  Today's healthcare provider: Trenton Frock, PA-C   Cc. Right shoulder pain post fall  Subjective     Pt reports she fell in the bathroom over the weekend, landed on her right side. Unable to get up, they were at a hotel, called EMS and she was assisted to stand and checked out.   Initially felt okay, but starting earlier this week having right shoulder pain, ROM difficulties. Has some bruising on her chest. Denies any head injury.   Medications: Outpatient Medications Prior to Visit  Medication Sig   acetaminophen  (TYLENOL ) 500 MG tablet Take 500-1,000 mg by mouth as needed for moderate pain (pain score 4-6).   amiodarone  (PACERONE ) 100 MG tablet TAKE 1 TABLET BY MOUTH DAILY   atorvastatin  (LIPITOR) 20 MG tablet Take 1 tablet (20 mg total) by mouth daily.   calcium  carbonate (TUMS - DOSED IN MG ELEMENTAL CALCIUM ) 500 MG chewable tablet Chew 1 tablet by mouth as needed for indigestion or heartburn.   Calcium  Carbonate-Vit D-Min (CALTRATE 600+D PLUS MINERALS) 600-800 MG-UNIT CHEW Chew 1 each by mouth daily.   Cholecalciferol 50 MCG (2000 UT) TABS Take 2,000 Units by mouth daily.   famotidine  (PEPCID ) 40 MG tablet Take 40 mg by mouth daily as needed for heartburn or indigestion.   furosemide  (LASIX ) 20 MG tablet Take 20 mg by mouth as needed for fluid or edema.   guaiFENesin -dextromethorphan  (ROBITUSSIN DM) 100-10 MG/5ML syrup Take 10 mLs by mouth every 6 (six) hours as needed for cough.   melatonin 5 MG TABS Take 1.25 mg by mouth at bedtime as needed (sleep).   nystatin  (MYCOSTATIN /NYSTOP ) powder Apply 1 Application topically 3 (three) times daily. TO skin rash at axilla or below breasts   rivaroxaban  (XARELTO ) 20 MG TABS tablet Take 1 tablet (20 mg total) by mouth daily with supper.   sacubitril -valsartan  (ENTRESTO ) 24-26 MG Take 1 tablet by mouth 2  (two) times daily.   tamoxifen  (NOLVADEX ) 20 MG tablet TAKE 1 TABLET BY MOUTH DAILY   venlafaxine  XR (EFFEXOR -XR) 37.5 MG 24 hr capsule Take 1 capsule (37.5 mg total) by mouth daily with breakfast.   vitamin B-12 (CYANOCOBALAMIN ) 100 MCG tablet Take 100 mcg by mouth daily.   No facility-administered medications prior to visit.    Review of Systems  Constitutional:  Negative for fatigue and fever.  Respiratory:  Negative for cough and shortness of breath.   Cardiovascular:  Negative for chest pain and leg swelling.  Gastrointestinal:  Negative for abdominal pain.  Musculoskeletal:  Positive for arthralgias and myalgias.  Neurological:  Negative for dizziness and headaches.       Objective    BP 93/60   Pulse 71   Ht 5\' 2"  (1.575 m)   Wt 183 lb 3.2 oz (83.1 kg)   BMI 33.51 kg/m    Physical Exam Vitals reviewed.  Constitutional:      Appearance: She is not ill-appearing.  HENT:     Head: Normocephalic.  Eyes:     Conjunctiva/sclera: Conjunctivae normal.  Cardiovascular:     Rate and Rhythm: Normal rate.  Pulmonary:     Effort: Pulmonary effort is normal. No respiratory distress.  Musculoskeletal:     Comments: Limited ROM right shoulder all movements. Tenderness to [facility] joint, ant and post shoulder. Extensive bruising to right shoulder and front of chest  Neurological:     Mental Status: She is alert and oriented to person, place, and time.  Psychiatric:        Mood and Affect: Mood normal.        Behavior: Behavior normal.     No results found for any visits on 11/15/23.  Assessment & Plan    Acute pain of right shoulder Pt has CI to most pain meds, dislikes opioids Cont tylenol , rx lidocaine  patches.  Recommend heat/ice/gentle movement. Tried to supple w/ shoulder splint but did not fit and we do not have a larger size.  Recommending we get otc.  Will get xrays, refer to ortho -     Lidocaine ; Place 1 patch onto the skin daily. Remove & Discard patch within  12 hours or as directed by MD  Dispense: 15 patch; Refill: 0 -     Ambulatory referral to Orthopedics -     DG Humerus Right -     DG Clavicle Right -     DG Shoulder Right  Fall, initial encounter    Return if symptoms worsen or fail to improve.       Trenton Frock, PA-C  [facility] at [facility] [phone] (phone) [phone] (fax)  [facility]

## 2023-11-20 ENCOUNTER — Encounter: Payer: Self-pay | Admitting: Family Medicine

## 2023-11-20 ENCOUNTER — Ambulatory Visit: Admitting: Family Medicine

## 2023-11-20 VITALS — BP 106/72 | HR 69 | Ht 62.0 in | Wt 183.0 lb

## 2023-11-20 DIAGNOSIS — G8929 Other chronic pain: Secondary | ICD-10-CM | POA: Diagnosis not present

## 2023-11-20 DIAGNOSIS — M545 Low back pain, unspecified: Secondary | ICD-10-CM | POA: Diagnosis not present

## 2023-11-20 DIAGNOSIS — M25511 Pain in right shoulder: Secondary | ICD-10-CM | POA: Diagnosis not present

## 2023-11-20 NOTE — Patient Instructions (Addendum)
 Good to see you today- you might want to try some [place]pas otc lidocaine  patches for your arm We will also get you set up for PT to work on your right shoulder and your back Let me know if your pain is not improving over the next few days- I can send in something stronger for pain if needed!   Please keep me posted- let's visit in 3-4 months

## 2023-11-21 ENCOUNTER — Other Ambulatory Visit (HOSPITAL_COMMUNITY): Payer: Self-pay

## 2023-11-21 ENCOUNTER — Telehealth: Payer: Self-pay

## 2023-11-21 NOTE — Telephone Encounter (Signed)
 Pharmacy Patient Advocate Encounter   Received notification from CoverMyMeds that prior authorization for Lidocaine  5% patches  is required/requested.   Insurance verification completed.   The patient is insured through [employer] .   Per test claim: PA required; PA submitted to above mentioned insurance via CoverMyMeds Key/confirmation #/EOC [id number] Status is pending

## 2023-11-25 ENCOUNTER — Encounter: Payer: Self-pay | Admitting: Family Medicine

## 2023-11-25 NOTE — Telephone Encounter (Signed)
 Pharmacy Patient Advocate Encounter  Received notification from [facility] that Prior Authorization for Lidocaine  5% patches  has been DENIED.  See denial reason below. No denial letter attached in CMM. Will attach denial letter to Media tab once received.   PA #/Case ID/Reference #: [id number]

## 2023-12-03 ENCOUNTER — Ambulatory Visit (INDEPENDENT_AMBULATORY_CARE_PROVIDER_SITE_OTHER): Payer: Medicare Other

## 2023-12-03 DIAGNOSIS — I480 Paroxysmal atrial fibrillation: Secondary | ICD-10-CM | POA: Diagnosis not present

## 2023-12-03 LAB — CUP PACEART REMOTE DEVICE CHECK
Battery Remaining Longevity: 64 mo
Battery Remaining Percentage: 75 %
Battery Voltage: 2.99 V
Brady Statistic AP VP Percent: 83 %
Brady Statistic AP VS Percent: 1 %
Brady Statistic AS VP Percent: 5.8 %
Brady Statistic AS VS Percent: 11 %
Brady Statistic RA Percent Paced: 63 %
Date Time Interrogation Session: 20250624055329
Implantable Lead Connection Status: 753985
Implantable Lead Connection Status: 753985
Implantable Lead Connection Status: 753985
Implantable Lead Implant Date: 20230626
Implantable Lead Implant Date: 20230626
Implantable Lead Implant Date: 20230626
Implantable Lead Location: 753858
Implantable Lead Location: 753859
Implantable Lead Location: 753860
Implantable Pulse Generator Implant Date: 20230626
Lead Channel Impedance Value: 400 Ohm
Lead Channel Impedance Value: 480 Ohm
Lead Channel Impedance Value: 890 Ohm
Lead Channel Pacing Threshold Amplitude: 0.5 V
Lead Channel Pacing Threshold Amplitude: 0.75 V
Lead Channel Pacing Threshold Amplitude: 1.25 V
Lead Channel Pacing Threshold Pulse Width: 0.5 ms
Lead Channel Pacing Threshold Pulse Width: 0.5 ms
Lead Channel Pacing Threshold Pulse Width: 0.5 ms
Lead Channel Sensing Intrinsic Amplitude: 12 mV
Lead Channel Sensing Intrinsic Amplitude: 2.5 mV
Lead Channel Setting Pacing Amplitude: 2.25 V
Lead Channel Setting Pacing Amplitude: 2.5 V
Lead Channel Setting Pacing Amplitude: 2.5 V
Lead Channel Setting Pacing Pulse Width: 0.5 ms
Lead Channel Setting Pacing Pulse Width: 0.5 ms
Lead Channel Setting Sensing Sensitivity: 2 mV
Pulse Gen Model: 3562
Pulse Gen Serial Number: 8071621

## 2023-12-04 ENCOUNTER — Ambulatory Visit: Payer: Self-pay | Admitting: Cardiology

## 2023-12-05 NOTE — Progress Notes (Signed)
  Electrophysiology Office Follow up Visit Note:    Date:  12/06/2023   ID:  Sheila Schmidt, DOB 1935/08/14, MRN [id number]  PCP:  Watt Harlene BROCKS, MD  [facility] Cardiologist:  None  [facility] Electrophysiologist:  OLE ONEIDA HOLTS, MD    Interval History:     Sheila Schmidt is a 88 y.o. female who presents for a follow up visit.   She last saw her in a September 01, 2022.  Has a history of nonischemic cardiomyopathy and a CRT-P in situ.  She was on Xarelto  for persistent atrial fibrillation and amiodarone . She is doing well today.  She is with her daughter who I previously met.  She did have a fall recently that was mechanical in nature.  It was because of the transition between hardwood's and the carpet at her daughter's home.  Thankfully, no significant personal injury.       Past medical, surgical, social and family history were reviewed.  ROS:   Please see the history of present illness.    All other systems reviewed and are negative.  EKGs/Labs/Other Studies Reviewed:    The following studies were reviewed today:  December 06, 2023 in-clinic device interrogation personally reviewed Battery and lead parameter stable Presenting rhythm sinus Several AHR episodes reviewed No programming changes made today       Physical Exam:    VS:  BP 110/60   Pulse 60   Ht 5' 2 (1.575 m)   Wt 180 lb (81.6 kg)   SpO2 93%   BMI 32.92 kg/m     Wt Readings from Last 3 Encounters:  12/06/23 180 lb (81.6 kg)  11/20/23 183 lb (83 kg)  11/15/23 183 lb 3.2 oz (83.1 kg)     GEN: no distress CARD: RRR, No MRG.  CIED pocket well-healed RESP: No IWOB. CTAB.      ASSESSMENT:    1. Paroxysmal atrial fibrillation (HCC)   2. Presence of cardiac resynchronization therapy pacemaker (CRT-P)   3. Chronic systolic heart failure (HCC)   4. Encounter for long-term (current) use of high-risk medication    PLAN:    In order of problems listed above:  #Chronic systolic heart  failure #CRT-P in situ NYHA class II.  Follows with Dr. Krasowski. Device functioning appropriately.  Continue remote monitoring  #Atrial fibrillation #High risk med use-amiodarone  Update CMP, TSH and free T4 today Continue Xarelto   Follow-up 6 months with the EP APP  Signed, OLE HOLTS, MD, [facility], [facility] 12/06/2023 11:09 AM    Electrophysiology [facility]

## 2023-12-06 ENCOUNTER — Ambulatory Visit: Attending: Cardiology | Admitting: Cardiology

## 2023-12-06 ENCOUNTER — Other Ambulatory Visit: Payer: Self-pay

## 2023-12-06 ENCOUNTER — Encounter: Payer: Self-pay | Admitting: Cardiology

## 2023-12-06 VITALS — BP 110/60 | HR 60 | Ht 62.0 in | Wt 180.0 lb

## 2023-12-06 DIAGNOSIS — I5022 Chronic systolic (congestive) heart failure: Secondary | ICD-10-CM

## 2023-12-06 DIAGNOSIS — Z950 Presence of cardiac pacemaker: Secondary | ICD-10-CM | POA: Diagnosis not present

## 2023-12-06 DIAGNOSIS — Z79899 Other long term (current) drug therapy: Secondary | ICD-10-CM

## 2023-12-06 DIAGNOSIS — I480 Paroxysmal atrial fibrillation: Secondary | ICD-10-CM | POA: Diagnosis not present

## 2023-12-06 NOTE — Patient Instructions (Signed)
 Medication Instructions:  Your physician recommends that you continue on your current medications as directed. Please refer to the Current Medication list given to you today.  *If you need a refill on your cardiac medications before your next appointment, please call your pharmacy*  Lab Work: TODAY: CMET, TSH, T4   Follow-Up: At [facility], you and your health needs are our priority.  As part of our continuing mission to provide you with exceptional heart care, our providers are all part of one team.  This team includes your primary Cardiologist (physician) and Advanced Practice Providers or APPs (Physician Assistants and Nurse Practitioners) who all work together to provide you with the care you need, when you need it.  Your next appointment:   6 months  Provider:   You will see one of the following Advanced Practice Providers on your designated Care Team:   Mertha Abrahams, Kennard Pea [address] [place], PA-C Suzann Riddle, NP Creighton Doffing, NP

## 2023-12-07 LAB — COMPREHENSIVE METABOLIC PANEL WITH GFR
ALT: 10 IU/L (ref 0–32)
AST: 17 IU/L (ref 0–40)
Albumin: 3.9 g/dL (ref 3.7–4.7)
Alkaline Phosphatase: 56 IU/L (ref 44–121)
BUN/Creatinine Ratio: 20 (ref 12–28)
BUN: 19 mg/dL (ref 8–27)
Bilirubin Total: 0.9 mg/dL (ref 0.0–1.2)
CO2: 22 mmol/L (ref 20–29)
Calcium: 9.3 mg/dL (ref 8.7–10.3)
Chloride: 104 mmol/L (ref 96–106)
Creatinine, Ser: 0.94 mg/dL (ref 0.57–1.00)
Globulin, Total: 2.4 g/dL (ref 1.5–4.5)
Glucose: 77 mg/dL (ref 70–99)
Potassium: 4.1 mmol/L (ref 3.5–5.2)
Sodium: 140 mmol/L (ref 134–144)
Total Protein: 6.3 g/dL (ref 6.0–8.5)
eGFR: 59 mL/min/{1.73_m2} — ABNORMAL LOW (ref >59)

## 2023-12-07 LAB — TSH: TSH: 2.73 u[IU]/mL (ref 0.450–4.500)

## 2023-12-07 LAB — T4, FREE: Free T4: 1.6 ng/dL (ref 0.82–1.77)

## 2023-12-11 ENCOUNTER — Ambulatory Visit: Attending: Family Medicine | Admitting: Rehabilitation

## 2023-12-11 ENCOUNTER — Encounter: Payer: Self-pay | Admitting: Rehabilitation

## 2023-12-11 ENCOUNTER — Other Ambulatory Visit: Payer: Self-pay

## 2023-12-11 DIAGNOSIS — G8929 Other chronic pain: Secondary | ICD-10-CM | POA: Insufficient documentation

## 2023-12-11 DIAGNOSIS — M546 Pain in thoracic spine: Secondary | ICD-10-CM | POA: Diagnosis present

## 2023-12-11 DIAGNOSIS — R293 Abnormal posture: Secondary | ICD-10-CM | POA: Insufficient documentation

## 2023-12-11 DIAGNOSIS — R262 Difficulty in walking, not elsewhere classified: Secondary | ICD-10-CM | POA: Insufficient documentation

## 2023-12-11 DIAGNOSIS — G8911 Acute pain due to trauma: Secondary | ICD-10-CM | POA: Diagnosis present

## 2023-12-11 DIAGNOSIS — M25511 Pain in right shoulder: Secondary | ICD-10-CM | POA: Insufficient documentation

## 2023-12-11 DIAGNOSIS — M6281 Muscle weakness (generalized): Secondary | ICD-10-CM | POA: Diagnosis present

## 2023-12-11 IMAGING — MG MM DIGITAL SCREENING BILAT W/ TOMO AND CAD
8 series · 8 of 24 positions shown · non-contrast
Comparison: Previous exam(s).

CLINICAL DATA: Screening.

EXAM:
DIGITAL SCREENING BILATERAL MAMMOGRAM WITH TOMOSYNTHESIS AND CAD
TECHNIQUE: Bilateral screening digital craniocaudal and mediolateral oblique
mammograms were obtained. Bilateral screening digital breast
tomosynthesis was performed. The images were evaluated with
computer-aided detection.

[L CC synth-2D]
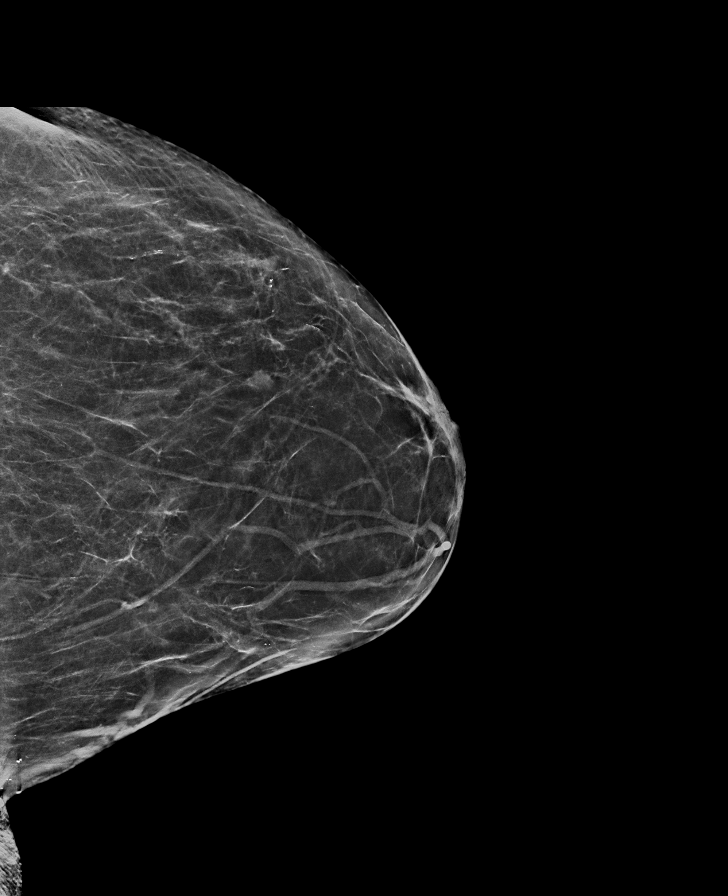

[L MLO synth-2D]
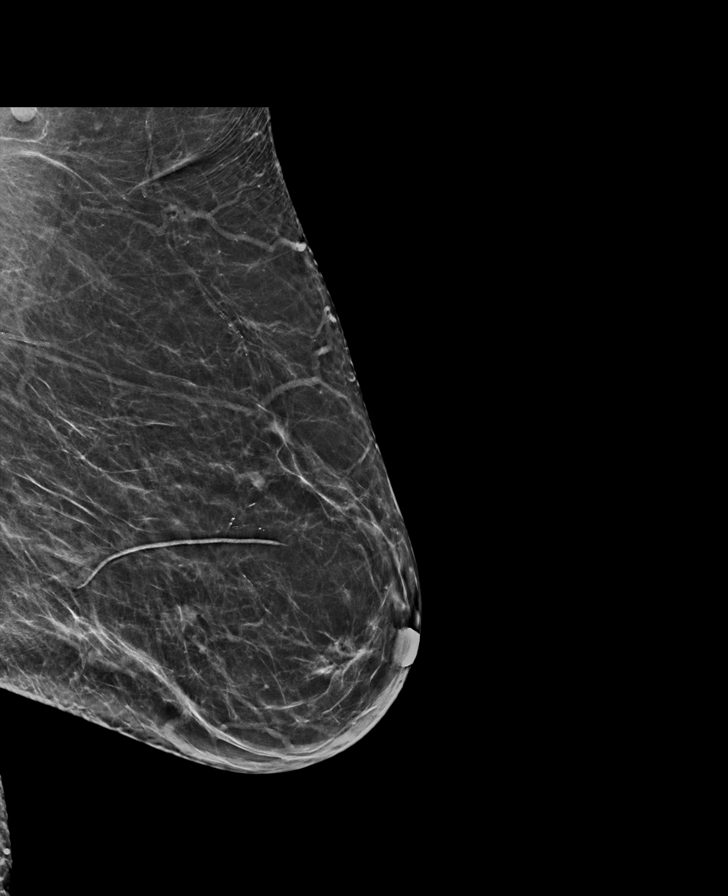

[R CC synth-2D]
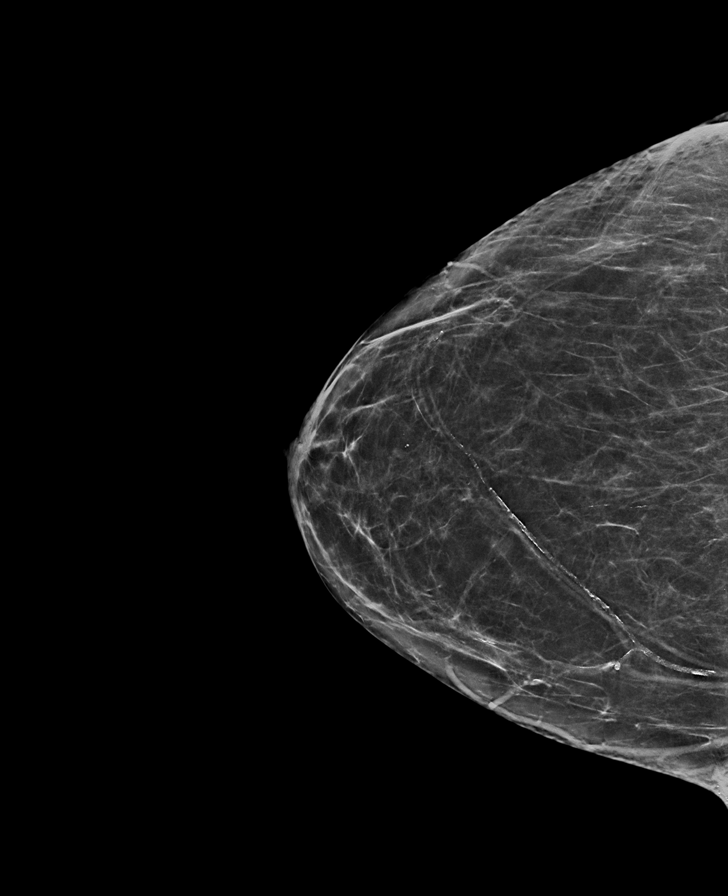

[R MLO synth-2D]
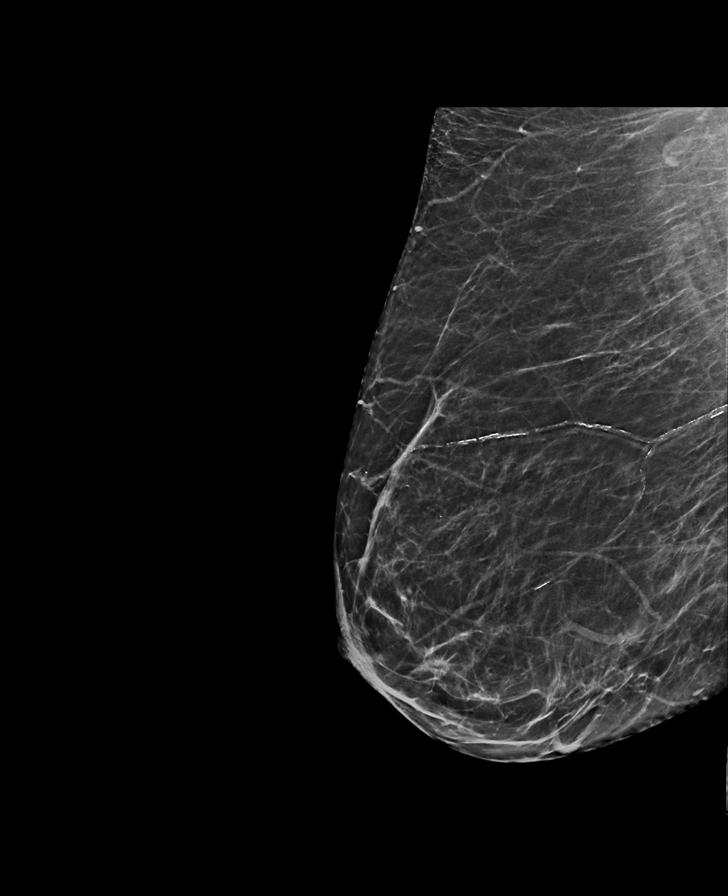

[L MLO tomo · tomo slice 33/66.0]
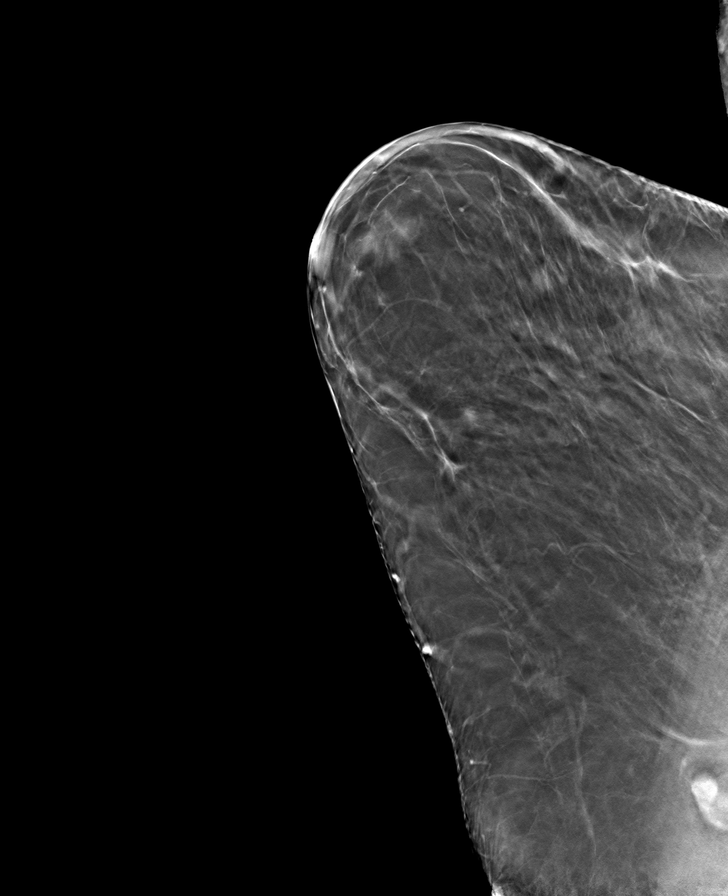

[R CC tomo · tomo slice 31/62.0]
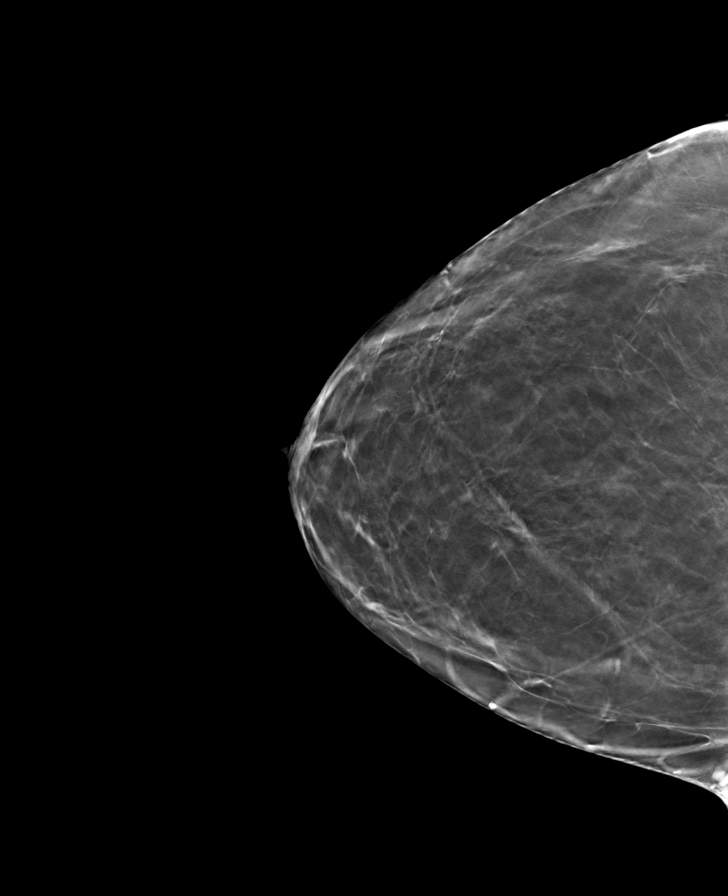

[L CC tomo · tomo slice 33/64.0]
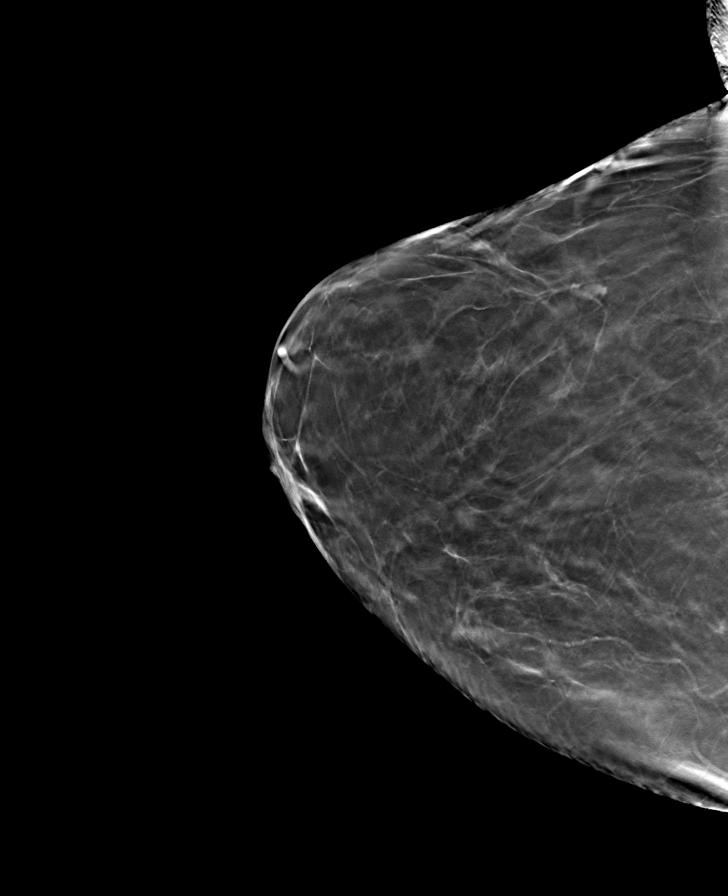

[R MLO tomo · tomo slice 33/66.0]
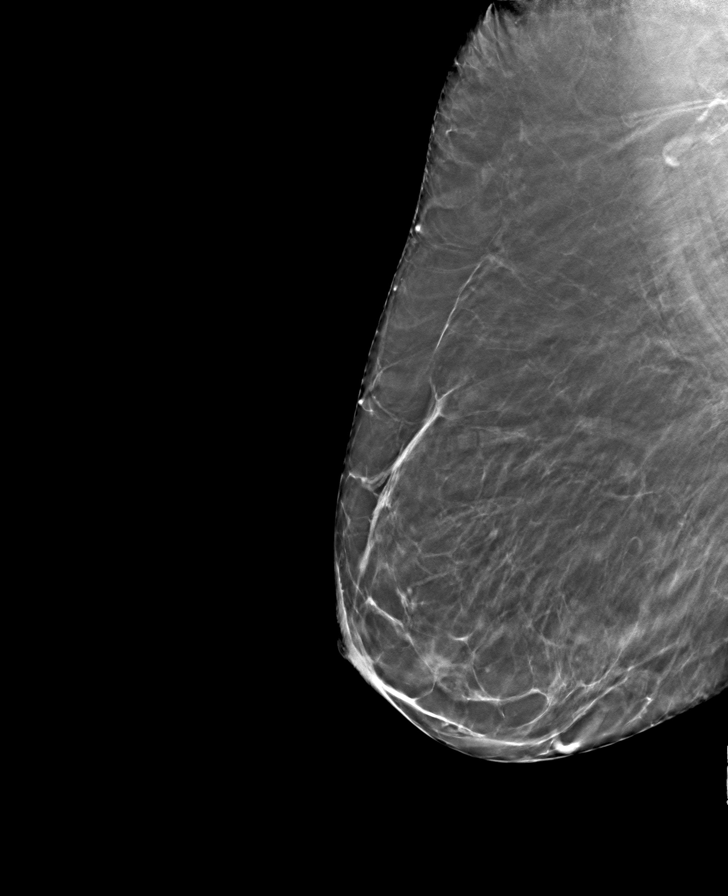

[8 of 24 positions shown; findings below may reference images not displayed]

ACR Breast Density Category b: There are scattered areas of
fibroglandular density.
FINDINGS: There are no findings suspicious for malignancy.
IMPRESSION: No mammographic evidence of malignancy. A result letter of this
screening mammogram will be mailed directly to the patient.

RECOMMENDATION:
Screening mammogram in one year. (Code:51-O-LD2)

BI-RADS CATEGORY  1: Negative.

## 2023-12-11 NOTE — Therapy (Addendum)
 OUTPATIENT PHYSICAL THERAPY SHOULDER EVALUATION   Patient Name: Sheila Schmidt MRN: [id number] DOB:28-Jun-1935, 88 y.o., female Today's Date: 12/11/2023   END OF SESSION:   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   Dysrhythmia    Afib   Heart murmur    Hx of adenomatous colonic polyps    Hyperlipidemia    Presence of permanent cardiac pacemaker    Past Surgical History:  Procedure Laterality Date   ATRIAL FLUTTER ABLATION     BIV PACEMAKER INSERTION CRT-P N/A 12/04/2021   Procedure: BIV PACEMAKER INSERTION CRT-P;  Surgeon: Cindie Ole DASEN, MD;  Location: [facility];  Service: Cardiovascular;  Laterality: N/A;   BREAST BIOPSY Left 07/30/2022   US  LT BREAST BX W LOC DEV 1ST LESION IMG BX SPEC US  GUIDE 07/30/2022 [facility]   BREAST BIOPSY Left 09/10/2022   US  LT RADIOACTIVE SEED LOC 09/10/2022 [facility]   BREAST BIOPSY Left 09/10/2022   US  LT RADIOACTIVE SEED LOC 09/10/2022 [facility]   BREAST EXCISIONAL BIOPSY Left 2010   benign   BREAST LUMPECTOMY WITH RADIOACTIVE SEED AND SENTINEL LYMPH NODE BIOPSY Left 09/11/2022   Procedure: LEFT BREAST LUMPECTOMY WITH RADIOACTIVE SEED;  Surgeon: Vanderbilt Ned, MD;  Location: [facility] OR;  Service: General;  Laterality: Left;   CARDIOVERSION  05/2020   in [facility]   CARDIOVERSION N/A 06/21/2021   Procedure: CARDIOVERSION;  Surgeon: Barbaraann Darryle Ned, MD;  Location: [facility] ENDOSCOPY;  Service: Cardiovascular;  Laterality: N/A;   CARDIOVERSION N/A 03/26/2022   Procedure: CARDIOVERSION;  Surgeon: Pietro Redell RAMAN, MD;  Location: [facility] ENDOSCOPY;  Service: Cardiovascular;  Laterality: N/A;   CHOLECYSTECTOMY     CHOLECYSTECTOMY, LAPAROSCOPIC  '06   [name]   KNEE ARTHROSCOPY     Left '00 [place]/ Right '08   lumpectomy- remote     benign   RADIOACTIVE SEED GUIDED AXILLARY SENTINEL LYMPH  NODE Left 09/11/2022   Procedure: RADIOACTIVE SEED GUIDED LEFT SENTINEL LYMPH NODE BIOPSY;  Surgeon: Vanderbilt Ned, MD;  Location: [facility] OR;  Service: General;  Laterality: Left;   TOOTH EXTRACTION     TOTAL KNEE ARTHROPLASTY  02/05/2011   left   Patient Active Problem List   Diagnosis Date Noted   Acute respiratory failure with hypoxia (HCC) 07/03/2023   Acute hypoxic respiratory failure (HCC) 07/03/2023   Hypertensive disorder 01/15/2023   Dyslipidemia 01/15/2023   Genetic testing 09/18/2022   Pacemaker [employer] device BiV 09/06/2022   Malignant neoplasm of upper-outer quadrant of left breast in female, estrogen receptor positive (HCC) 08/06/2022   Pure hypercholesterolemia 05/15/2022   Skin cancer 03/07/2022   Closed nondisplaced fracture of distal phalanx of right thumb 02/15/2022   Cancer (HCC) 05/25/2021   Left bundle branch block 04/12/2021   Dilated cardiomyopathy (HCC) 04/12/2021   Vasculitis (HCC) 02/22/2021   Anxiety state 02/12/2021   Body fluid retention 02/12/2021   Cervical intraepithelial neoplasia 02/12/2021   Insomnia 02/12/2021   Pruritus of vulva 02/12/2021   Leg wound, right 10/03/2020   Bilateral primary osteoarthritis of knee 03/30/2019   Paroxysmal atrial fibrillation (HCC) 04/07/2015   Chronic anticoagulation 04/07/2015   Paroxysmal atrial flutter (HCC) 03/16/2015   Essential hypertension, benign 02/24/2014   Sciatica 12/08/2013   Hyperlipidemia 09/25/2013   Osteopenia 11/19/2012   Low back pain 01/26/2010  Carcinoma in situ of breast 09/04/2009   DEPRESSION, PROLONGED 09/04/2009   OA (osteoarthritis) of knee 09/04/2009   PERIPHERAL EDEMA 09/04/2009   History of colonic polyps 09/04/2009    PCP: Copland, Harlene BROCKS, MD   REFERRING PROVIDER: Watt Harlene BROCKS, MD   REFERRING DIAG: M25.511 (ICD-10-CM) - Acute pain of right shoulder   THERAPY DIAG:  No diagnosis found.  RATIONALE FOR EVALUATION AND TREATMENT: Rehabilitation  ONSET DATE:  11/10/23  NEXT MD VISIT:  f/u PRN   SUBJECTIVE:                                                                                                                                                                                                         SUBJECTIVE STATEMENT: Patient is an 88 y/o female referred to PT for R shoulder pain and LBP following a fall 11/10/23 on vacation in the hotel.   States fell coming out of bathroom with floor transition from floor to carpet.  States hit R shoulder against the wall on the way down.  R shoulder pain resulted from the fall.   Patient reports significant bruising that has now resolved.    R shoulder xrays showed no fx's.  She has been using Salonpas with some relief.   LBP is actually more L sided thoracic in nature, and not a new issue.   The pain is intermittent and worse with making her bed in forward flexed reaching position and with vacuuming.    PAIN: Are you having pain? Yes: NPRS scale: R shoulder 0/10 resting, 2-3/10 with certain movements;   L thoracic spine:  0/10 resting; 5-6/10 worst with activities Pain location: R shoulder Pain description: subtle pinching pain Aggravating factors: elevating the R shoulder into full elevation Relieving factors: not elevating or lifting the R arm  PERTINENT HISTORY:  recurrent L breast CA with node dissection 4/24, dilated cardiomyopathy, Afib, pacemaker, HTN, anxiety, LBP, sciatica, anxiety  PRECAUTIONS: ICD/Pacemaker, Lymphedema precautions LUE due to breast CA  RED FLAGS: None  HAND DOMINANCE: Right  WEIGHT BEARING RESTRICTIONS: No  FALLS:  Has patient fallen in last 6 months? Yes. Number of falls 1  LIVING ENVIRONMENT: Lives with: lives with their daughter Lives in: House/apartment Stairs: Yes: Internal: 12 steps; on right going up and External: 4 steps; on right going up Has following equipment at home: Single point cane and Walker - 2 wheeled  OCCUPATION: retired from Investment banker, corporate  work  PLOF: Independent with community mobility with device  PATIENT GOALS: get my shoulder pain resolved, and walk better on the  R knee (OA)   OBJECTIVE: (objective measures completed at initial evaluation unless otherwise dated)  DIAGNOSTIC FINDINGS:  CLINICAL DATA:  Right shoulder pain after fall 5 days ago.   EXAM: RIGHT SHOULDER - 2+ VIEW   COMPARISON:  None Available.   FINDINGS: There is no evidence of fracture or dislocation. There is no evidence of arthropathy or other focal bone abnormality. Soft tissues are unremarkable.   IMPRESSION: Negative.     Electronically Signed   By: Lynwood Landy Raddle M.D.   On: 11/15/2023 15:45  PATIENT SURVEYS:  Quick Dash   COGNITION: Overall cognitive status: Within functional limits for tasks assessed     SENSATION: WFL  POSTURE: rounded shoulders, forward head, and R scoliosis  UPPER EXTREMITY ROM:   Active ROM Right eval Left eval  Shoulder flexion 160 165  Shoulder extension 70 70  Shoulder abduction 150 165  Shoulder adduction    Shoulder internal rotation 100 100  Shoulder external rotation 100 110  Elbow flexion    Elbow extension    Wrist flexion    Wrist extension    Wrist ulnar deviation    Wrist radial deviation    Wrist pronation    Wrist supination    (Blank rows = not tested)  UPPER EXTREMITY MMT:  MMT Right eval Left eval  Shoulder flexion 4- p! 5  Shoulder extension 4- 4  Shoulder abduction 4- p! 5  Shoulder adduction    Shoulder internal rotation 4 5  Shoulder external rotation 3- p! 5  Middle trapezius    Lower trapezius    Elbow flexion    Elbow extension    Wrist flexion    Wrist extension    Wrist ulnar deviation    Wrist radial deviation    Wrist pronation    Wrist supination    Grip strength (lbs)    (Blank rows = not tested) LUMBAR ROM:   Active  A/PROM  eval  Flexion 90% ROM  Extension 90% ROM  Right lateral flexion To knee jt  Left lateral flexion Above knee jt   Right rotation 90%  Left rotation 75%   (Blank rows = not tested)   SHOULDER SPECIAL TESTS: Impingement tests: Neer impingement test: positive , Hawkins/Kennedy impingement test: negative, and Painful arc test: negative SLAP lesions: Biceps load test: positive  Instability tests: Apprehension test: negative Rotator cuff assessment: Drop arm test: negative, Empty can test: positive , Full can test: positive , and Gerber lift off test: negative Biceps assessment: Speed's test: positive   LUMBAR SPECIAL TESTS:  Straight leg raise test: Negative   SLR RLE is 70 deg, LLE is 80 deg  JOINT MOBILITY TESTING:  hypermobile Bilateral shoulder joint accessory motions  PALPATION:  TTP over the R greater tuberosity, nontender over coracoid and posterior R shoulder;     TTP over L T-10 and T11 facets and paraspinals    TODAY'S TREATMENT:   SELF CARE: Provided education lifting with BUE to decrease pain, full can position of R shoulder with lifting.    PATIENT EDUCATION:  Education details: PT eval findings, anticipated POC, initial HEP, postural awareness, and posture and body mechanics for typical daily postioning, mobility and household tasks  Person educated: Patient Education method: Explanation, Demonstration, Verbal cues, Tactile cues, and Handouts Education comprehension: verbalized understanding, verbal cues required, tactile cues required, and needs further education  HOME EXERCISE PROGRAM: Access Code: [email or web address] URL: [email or web address] Date: 12/11/2023 Prepared by: Garnette Montclair  Exercises - Sidelying Shoulder  Horizontal Abduction  - 1 x daily - 7 x weekly - 1 sets - 20 reps - Supine Shoulder Circles  - 1 x daily - 7 x weekly - 1 sets - 20 reps - Seated Shoulder External Rotation AAROM with Dowel  - 1 x daily - 7 x weekly - 1 sets - 20 reps - Seated Single Arm Shoulder External Rotation with Towel  - 1 x daily - 7 x weekly - 1 sets - 20 reps - Seated  Shoulder External Rotation AROM in Supported Abduction  - 1 x daily - 7 x weekly - 1 sets - 20 reps   ASSESSMENT:  CLINICAL IMPRESSION: EMALEE KNIES is a 88 y.o. female who was referred to physical therapy for evaluation and treatment for R shoulder pain and L lower thoracic pain.    Patient reports onset of R shoulder pain beginning approximately 1 month ago s/p fall. Pain is worse with reaching overhead, lifting any weight with the RUE past shoulder level, and haircare.  Patient has deficits in lumbar flexibility, R shoulder strength, abnormal posture, and TTP with abnormal muscle tension R shoulder and L thoracic spine/BLE which are interfering with ADLs and are impacting quality of life.  On QuickDASH patient scored 20.5/100 demonstrating 20.5% disability.  Zaynab will benefit from skilled PT to address above deficits to improve mobility and activity tolerance with decreased pain interference.   OBJECTIVE IMPAIRMENTS: Abnormal gait, decreased balance, decreased strength, and postural dysfunction.   ACTIVITY LIMITATIONS: lifting, stairs, reach over head, and locomotion level  PARTICIPATION LIMITATIONS: cleaning, laundry, and making bed, vacuuming  PERSONAL FACTORS: Age and 1-2 comorbidities: recurrent L breast CA with node dissection 4/24, dilated cardiomyopathy, Afib, pacemaker, HTN, anxiety, LBP, anxiety are also affecting patient's functional outcome.   REHAB POTENTIAL: Good  CLINICAL DECISION MAKING: Evolving/moderate complexity  EVALUATION COMPLEXITY: Moderate   GOALS: Goals reviewed with patient? Yes  SHORT TERM GOALS: Target date: 01/08/2024  Patient will be independent with initial HEP to improve outcomes and carryover.  Baseline: 100% PT assistance required for correct completion Goal status: INITIAL  2.  Patient will report 25% improvement in R shoulder pain and L thoracic pain to improve QOL.   Baseline: R shoulder 2-3/10; L thoracic spine 5-6/10 Goal status:  INITIAL   LONG TERM GOALS: Target date: 02/05/2024  Patient will be independent with ongoing/advanced HEP for self-management at home.  Baseline: not doing advanced HEP yet Goal status: INITIAL  2.  Patient will report 50-75% improvement in R shoulder and L thoracic pain to improve QOL.  Baseline: R shoulder 2-3/10; L thoracic 5-6/10 Goal status: INITIAL  3.  Patient to demonstrate improved upright posture with posterior shoulder girdle engaged to promote improved glenohumeral joint mobility. Baseline: forward head/rounded shoulders Goal status: INITIAL  4.  Patient to improve L thoracic sidebending and rotation to equal bilaterally/90% without pain provocation to allow for increased ease of ADLs.  Baseline: Refer to above lumbar ROM table Goal status: INITIAL  5.  Patient will demonstrate improved R shoulder strength to >/= 4+/5 for functional UE use. Baseline: Refer to above UE MMT table Goal status: INITIAL  6  Patient will report </= 5%% on QuickDASH (MCID = 14%) to demonstrate improved functional ability.  Baseline: 20.5 Goal status: INITIAL  7.  Patient will report no falls and improve on functional strength/balance testing (TBD next visit if able) to WNL for agegroup to reduce fall risk   Baseline: TBD  Goal status: INITIAL  PLAN:  PT FREQUENCY: 1x/week  PT DURATION: 8 weeks  PLANNED INTERVENTIONS: 02835- PT Re-evaluation, 97750- Physical Performance Testing, 97110-Therapeutic exercises, 97530- Therapeutic activity, [id number]- Neuromuscular re-education, [phone]- Self Care, 02859- Manual therapy, [phone]- Gait training, [phone]- Electrical stimulation (unattended), [phone]- Ultrasound, [id number]- Traction (mechanical), [id number]- Ionotophoresis 4mg /ml Dexamethasone , 79439 (1-2 muscles), [id number] (3+ muscles)- Dry Needling, Patient/Family education, Balance training, Stair training, Taping, Joint mobilization, Spinal mobilization, Cryotherapy, Moist heat, and Biofeedback  PLAN FOR NEXT  SESSION: Add wall slides/dusting and/or dowel with eccentric lowering, Tband rowing and scapular strengthening, B thoracic sidebending and rotational stretching, advance HEP, evaluate 5x STS and TUG if time   Faiz Weber, PT 12/11/2023, 9:41 AM  Date of referral: 11/20/23 [place] provider: Watt Harlene BROCKS, MD/ Referring diagnosis? M25.511 (ICD-10-CM) - Acute pain of right shoulder  Treatment diagnosis? (if different than referring diagnosis)  Acute pain of right shoulder due to trauma - Plan: PT plan of care cert/re-cert  Muscle weakness (generalized) - Plan: PT plan of care cert/re-cert  Chronic left-sided thoracic back pain - Plan: PT plan of care cert/re-cert  Abnormal posture - Plan: PT plan of care cert/re-cert  Difficulty in walking, not elsewhere classified - Plan: PT plan of care cert/re-cert  What was this (referring dx) caused by? Felton Hawks of Condition: Initial Onset (within last 3 months)   Laterality: Rt  Current Functional Measure Score: DASH 20.5  Objective measurements identify impairments when they are compared to normal values, the uninvolved extremity, and prior level of function.  [x]  Yes  []  No  Objective assessment of functional ability: Moderate functional limitations   Briefly describe symptoms: R shoulder pain s/p fall with 3-4-/5 strength in the R shoulder, inability to lift any significant weight overhead with R arm, difficulty doing housework, Engineer, manufacturing systems, laundry, any reaching overhead  How did symptoms start: fall 11/10/23  Average pain intensity:  Last 24 hours: 2-3  Past week: 2-3  How often does the pt experience symptoms? Frequently  How much have the symptoms interfered with usual daily activities? Quite a bit  How has condition changed since care began at this facility? Better  In general, how is the patients overall health? Fair  Onset date: 11/10/23   BACK PAIN (STarT Back Screening Tool) - (When applicable):  Has your  back pain spread down your leg(s) at sometime in the last 2 weeks? []  Yes   [x]  No Have you had pain in the shoulder or neck at sometime in the past 2 weeks? [x]  Yes   []  No Have you only walked short distances because of your back pain? []  Yes   [x]  No In the past 2 weeks, have you dressed more slowly than usual because of your back pain? []  Yes   [x]  No Do you think it is not really safe for person with a condition like yours to be physically active? [x]  Yes   []  No Have worrying thoughts been going through your mind a lot of the time? []  Yes   [x]  No Do you feel that your back pain is terrible and it is never going to get any better? []  Yes   [x]  No In general, have you stopped enjoying all the things you usually enjoy? []  Yes   [x]  No Overall, how bothersome has your back pain been in the last 2 weeks? []  Not at all   []  Slightly     [x]  Moderate   []  Very much     []  Extremely

## 2023-12-14 ENCOUNTER — Other Ambulatory Visit: Payer: Self-pay | Admitting: Family Medicine

## 2023-12-17 ENCOUNTER — Encounter: Payer: Self-pay | Admitting: Rehabilitation

## 2023-12-17 ENCOUNTER — Ambulatory Visit: Admitting: Rehabilitation

## 2023-12-17 DIAGNOSIS — M6281 Muscle weakness (generalized): Secondary | ICD-10-CM

## 2023-12-17 DIAGNOSIS — G8929 Other chronic pain: Secondary | ICD-10-CM

## 2023-12-17 DIAGNOSIS — R293 Abnormal posture: Secondary | ICD-10-CM

## 2023-12-17 DIAGNOSIS — G8911 Acute pain due to trauma: Secondary | ICD-10-CM

## 2023-12-17 NOTE — Therapy (Signed)
 OUTPATIENT PHYSICAL THERAPY SHOULDER EVALUATION   Patient Name: Sheila Schmidt MRN: [id number] DOB:1935/12/14, 88 y.o., female Today's Date: 12/17/2023   END OF SESSION:  PT End of Session - 12/17/23 1114     Visit Number 2    Date for PT Re-Evaluation 02/05/24    Authorization Type [employer] MCR    PT Start Time 1106    PT Stop Time 1152    PT Time Calculation (min) 46 min    Activity Tolerance Patient tolerated treatment well    Behavior During Therapy WFL for tasks assessed/performed          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   Dysrhythmia    Afib   Heart murmur    Hx of adenomatous colonic polyps    Hyperlipidemia    Presence of permanent cardiac pacemaker    Past Surgical History:  Procedure Laterality Date   ATRIAL FLUTTER ABLATION     BIV PACEMAKER INSERTION CRT-P N/A 12/04/2021   Procedure: BIV PACEMAKER INSERTION CRT-P;  Surgeon: Cindie Ole DASEN, MD;  Location: [facility];  Service: Cardiovascular;  Laterality: N/A;   BREAST BIOPSY Left 07/30/2022   US  LT BREAST BX W LOC DEV 1ST LESION IMG BX SPEC US  GUIDE 07/30/2022 [facility]   BREAST BIOPSY Left 09/10/2022   US  LT RADIOACTIVE SEED LOC 09/10/2022 [facility]   BREAST BIOPSY Left 09/10/2022   US  LT RADIOACTIVE SEED LOC 09/10/2022 [facility]   BREAST EXCISIONAL BIOPSY Left 2010   benign   BREAST LUMPECTOMY WITH RADIOACTIVE SEED AND SENTINEL LYMPH NODE BIOPSY Left 09/11/2022   Procedure: LEFT BREAST LUMPECTOMY WITH RADIOACTIVE SEED;  Surgeon: Vanderbilt Ned, MD;  Location: [facility] OR;  Service: General;  Laterality: Left;   CARDIOVERSION  05/2020   in [facility]   CARDIOVERSION N/A 06/21/2021   Procedure: CARDIOVERSION;  Surgeon: Barbaraann Darryle Ned, MD;  Location: [facility] ENDOSCOPY;  Service: Cardiovascular;  Laterality: N/A;   CARDIOVERSION N/A 03/26/2022    Procedure: CARDIOVERSION;  Surgeon: Pietro Redell RAMAN, MD;  Location: [facility] ENDOSCOPY;  Service: Cardiovascular;  Laterality: N/A;   CHOLECYSTECTOMY     CHOLECYSTECTOMY, LAPAROSCOPIC  '06   [name]   KNEE ARTHROSCOPY     Left '00 [place]/ Right '08   lumpectomy- remote     benign   RADIOACTIVE SEED GUIDED AXILLARY SENTINEL LYMPH NODE Left 09/11/2022   Procedure: RADIOACTIVE SEED GUIDED LEFT SENTINEL LYMPH NODE BIOPSY;  Surgeon: Vanderbilt Ned, MD;  Location: [facility] OR;  Service: General;  Laterality: Left;   TOOTH EXTRACTION     TOTAL KNEE ARTHROPLASTY  02/05/2011   left   Patient Active Problem List   Diagnosis Date Noted   Acute respiratory failure with hypoxia (HCC) 07/03/2023   Acute hypoxic respiratory failure (HCC) 07/03/2023   Hypertensive disorder 01/15/2023   Dyslipidemia 01/15/2023   Genetic testing 09/18/2022   Pacemaker [employer] device BiV 09/06/2022   Malignant neoplasm of upper-outer quadrant of left breast in female, estrogen receptor positive (HCC) 08/06/2022   Pure hypercholesterolemia 05/15/2022   Skin cancer 03/07/2022   Closed nondisplaced fracture of distal phalanx of right thumb 02/15/2022   Cancer (HCC) 05/25/2021   Left bundle branch block 04/12/2021   Dilated cardiomyopathy (HCC) 04/12/2021   Vasculitis (HCC) 02/22/2021   Anxiety state 02/12/2021  Body fluid retention 02/12/2021   Cervical intraepithelial neoplasia 02/12/2021   Insomnia 02/12/2021   Pruritus of vulva 02/12/2021   Leg wound, right 10/03/2020   Bilateral primary osteoarthritis of knee 03/30/2019   Paroxysmal atrial fibrillation (HCC) 04/07/2015   Chronic anticoagulation 04/07/2015   Paroxysmal atrial flutter (HCC) 03/16/2015   Essential hypertension, benign 02/24/2014   Sciatica 12/08/2013   Hyperlipidemia 09/25/2013   Osteopenia 11/19/2012   Low back pain 01/26/2010   Carcinoma in situ of breast 09/04/2009   DEPRESSION, PROLONGED 09/04/2009   OA (osteoarthritis) of knee 09/04/2009    PERIPHERAL EDEMA 09/04/2009   History of colonic polyps 09/04/2009    PCP: Watt Harlene BROCKS, MD   REFERRING PROVIDER: Watt Harlene BROCKS, MD   REFERRING DIAG: M25.511 (ICD-10-CM) - Acute pain of right shoulder   THERAPY DIAG:  Acute pain of right shoulder due to trauma  Muscle weakness (generalized)  Chronic left-sided thoracic back pain  Abnormal posture  RATIONALE FOR EVALUATION AND TREATMENT: Rehabilitation  ONSET DATE: 11/10/23  NEXT MD VISIT:  f/u PRN   SUBJECTIVE:                                                                                                                                                                                                         SUBJECTIVE STATEMENT: Patient states feels ok.   States doing her HEP, but having difficulty with shoulder ER exercise and understanding how to do it.   She denies any increase in pain, but still has the R shoulder pain.  Denies any falls or med changes since last visit  Patient is an 88 y/o female referred to PT for R shoulder pain and LBP following a fall 11/10/23 on vacation in the hotel.   States fell coming out of bathroom with floor transition from floor to carpet.  States hit R shoulder against the wall on the way down.  R shoulder pain resulted from the fall.   Patient reports significant bruising that has now resolved.    R shoulder xrays showed no fx's.  She has been using Salonpas with some relief.   LBP is actually more L sided thoracic in nature, and not a new issue.   The pain is intermittent and worse with making her bed in forward flexed reaching position and with vacuuming.    PAIN: Are you having pain? Yes: NPRS scale: R shoulder 0/10 resting, 2-3/10 with certain movements;   L thoracic spine:  0/10 resting; 5-6/10 worst with activities Pain location: R shoulder Pain description: subtle pinching pain Aggravating  factors: elevating the R shoulder into full elevation Relieving factors: not elevating  or lifting the R arm  PERTINENT HISTORY:  recurrent L breast CA with node dissection 4/24, dilated cardiomyopathy, Afib, pacemaker, HTN, anxiety, LBP, sciatica, anxiety  PRECAUTIONS: ICD/Pacemaker, Lymphedema precautions LUE due to breast CA  RED FLAGS: None  HAND DOMINANCE: Right  WEIGHT BEARING RESTRICTIONS: No  FALLS:  Has patient fallen in last 6 months? Yes. Number of falls 1  LIVING ENVIRONMENT: Lives with: lives with their daughter Lives in: House/apartment Stairs: Yes: Internal: 12 steps; on right going up and External: 4 steps; on right going up Has following equipment at home: Single point cane and Walker - 2 wheeled  OCCUPATION: retired from [employer], corporate work  PLOF: Independent with community mobility with device  PATIENT GOALS: get my shoulder pain resolved, and walk better on the R knee (OA)   OBJECTIVE: (objective measures completed at initial evaluation unless otherwise dated)  DIAGNOSTIC FINDINGS:  CLINICAL DATA:  Right shoulder pain after fall 5 days ago.   EXAM: RIGHT SHOULDER - 2+ VIEW   COMPARISON:  None Available.   FINDINGS: There is no evidence of fracture or dislocation. There is no evidence of arthropathy or other focal bone abnormality. Soft tissues are unremarkable.   IMPRESSION: Negative.     Electronically Signed   By: Lynwood Landy Raddle M.D.   On: 11/15/2023 15:45  PATIENT SURVEYS:  Quick Dash   COGNITION: Overall cognitive status: Within functional limits for tasks assessed     SENSATION: WFL  POSTURE: rounded shoulders, forward head, and R scoliosis  UPPER EXTREMITY ROM:   Active ROM Right eval Left eval  Shoulder flexion 160 165  Shoulder extension 70 70  Shoulder abduction 150 165  Shoulder adduction    Shoulder internal rotation 100 100  Shoulder external rotation 100 110  Elbow flexion    Elbow extension    Wrist flexion    Wrist extension    Wrist ulnar deviation    Wrist radial deviation    Wrist  pronation    Wrist supination    (Blank rows = not tested)  UPPER EXTREMITY MMT:  MMT Right eval Left eval  Shoulder flexion 4- p! 5  Shoulder extension 4- 4  Shoulder abduction 4- p! 5  Shoulder adduction    Shoulder internal rotation 4 5  Shoulder external rotation 3- p! 5  Middle trapezius    Lower trapezius    Elbow flexion    Elbow extension    Wrist flexion    Wrist extension    Wrist ulnar deviation    Wrist radial deviation    Wrist pronation    Wrist supination    Grip strength (lbs)    (Blank rows = not tested) LUMBAR ROM:   Active  A/PROM  eval  Flexion 90% ROM  Extension 90% ROM  Right lateral flexion To knee jt  Left lateral flexion Above knee jt  Right rotation 90%  Left rotation 75%   (Blank rows = not tested)   SHOULDER SPECIAL TESTS: Impingement tests: Neer impingement test: positive , Hawkins/Kennedy impingement test: negative, and Painful arc test: negative SLAP lesions: Biceps load test: positive  Instability tests: Apprehension test: negative Rotator cuff assessment: Drop arm test: negative, Empty can test: positive , Full can test: positive , and Gerber lift off test: negative Biceps assessment: Speed's test: positive   LUMBAR SPECIAL TESTS:  Straight leg raise test: Negative   SLR RLE is 70 deg, LLE  is 80 deg  JOINT MOBILITY TESTING:  hypermobile Bilateral shoulder joint accessory motions  PALPATION:  TTP over the R greater tuberosity, nontender over coracoid and posterior R shoulder;     TTP over L T-10 and T11 facets and paraspinals    TODAY'S TREATMENT:  12/17/23 UBE x 5' F/B HEP review: SL HABD Supine circles at 90 CW/CCW Supine active ER Seated active ER Seated ER at 90 deg abd  SL ER attempted but unable Standing row GTB x 20 RUE Standing shoulder ext RTB x 20 RUE Wall dusting up/down x 2/10 RUE Wall dusting CCW x 10 ea RUE  MANUAL THERAPY: To promote normalized muscle tension, improved flexibility, and reduced pain  utilizing manual TP therapy. Seated intermittent R sidebending stretching with arm reaching overhead with some R trunk rotation with maintained deep pressure to R thoracic paraspinal Tp during stretch and with relaxation phase  12/11/23 SELF CARE: Provided education lifting with BUE to decrease pain, full can position of R shoulder with lifting.    PATIENT EDUCATION:  Education details: PT eval findings, anticipated POC, initial HEP, postural awareness, and posture and body mechanics for typical daily postioning, mobility and household tasks  Person educated: Patient Education method: Explanation, Demonstration, Verbal cues, Tactile cues, and Handouts Education comprehension: verbalized understanding, verbal cues required, tactile cues required, and needs further education  HOME EXERCISE PROGRAM: Access Code: [email or web address] URL: [email or web address] Date: 12/11/2023 Prepared by: Garnette Montclair  Exercises - Sidelying Shoulder Horizontal Abduction  - 1 x daily - 7 x weekly - 1 sets - 20 reps - Supine Shoulder Circles  - 1 x daily - 7 x weekly - 1 sets - 20 reps - Seated Shoulder External Rotation AAROM with Dowel  - 1 x daily - 7 x weekly - 1 sets - 20 reps - Seated Single Arm Shoulder External Rotation with Towel  - 1 x daily - 7 x weekly - 1 sets - 20 reps - Seated Shoulder External Rotation AROM in Supported Abduction  - 1 x daily - 7 x weekly - 1 sets - 20 reps   ASSESSMENT:  CLINICAL IMPRESSION: Patient still  with significant R shoulder ER weakness and focus today is on increasing ER strength.   Added in some antigravity shoulder flexion activity with wall slides.  She is able to do seated ER today without the dowel/cane assisting for the first 10 reps so she is gradually improving in strength.    Sheila Schmidt is a 88 y.o. female who was referred to physical therapy for evaluation and treatment for R shoulder pain and L lower thoracic pain.    Patient reports onset  of R shoulder pain beginning approximately 1 month ago s/p fall. Pain is worse with reaching overhead, lifting any weight with the RUE past shoulder level, and haircare.  Patient has deficits in lumbar flexibility, R shoulder strength, abnormal posture, and TTP with abnormal muscle tension R shoulder and L thoracic spine/BLE which are interfering with ADLs and are impacting quality of life.  On QuickDASH patient scored 20.5/100 demonstrating 20.5% disability.  Sheila Schmidt will benefit from skilled PT to address above deficits to improve mobility and activity tolerance with decreased pain interference.   OBJECTIVE IMPAIRMENTS: Abnormal gait, decreased balance, decreased strength, and postural dysfunction.   ACTIVITY LIMITATIONS: lifting, stairs, reach over head, and locomotion level  PARTICIPATION LIMITATIONS: cleaning, laundry, and making bed, vacuuming  PERSONAL FACTORS: Age and 1-2 comorbidities: recurrent L breast CA with node dissection 4/24, dilated cardiomyopathy,  Afib, pacemaker, HTN, anxiety, LBP, anxiety are also affecting patient's functional outcome.   REHAB POTENTIAL: Good  CLINICAL DECISION MAKING: Evolving/moderate complexity  EVALUATION COMPLEXITY: Moderate   GOALS: Goals reviewed with patient? Yes  SHORT TERM GOALS: Target date: 01/08/2024  Patient will be independent with initial HEP to improve outcomes and carryover.  Baseline: 100% PT assistance required for correct completion Goal status: INITIAL  2.  Patient will report 25% improvement in R shoulder pain and L thoracic pain to improve QOL.   Baseline: R shoulder 2-3/10; L thoracic spine 5-6/10 Goal status: INITIAL   LONG TERM GOALS: Target date: 02/05/2024  Patient will be independent with ongoing/advanced HEP for self-management at home.  Baseline: not doing advanced HEP yet Goal status: INITIAL  2.  Patient will report 50-75% improvement in R shoulder and L thoracic pain to improve QOL.  Baseline: R shoulder  2-3/10; L thoracic 5-6/10 Goal status: INITIAL  3.  Patient to demonstrate improved upright posture with posterior shoulder girdle engaged to promote improved glenohumeral joint mobility. Baseline: forward head/rounded shoulders Goal status: INITIAL  4.  Patient to improve L thoracic sidebending and rotation to equal bilaterally/90% without pain provocation to allow for increased ease of ADLs.  Baseline: Refer to above lumbar ROM table Goal status: INITIAL  5.  Patient will demonstrate improved R shoulder strength to >/= 4+/5 for functional UE use. Baseline: Refer to above UE MMT table Goal status: INITIAL  6  Patient will report </= 5%% on QuickDASH (MCID = 14%) to demonstrate improved functional ability.  Baseline: 20.5 Goal status: INITIAL  7.  Patient will report no falls and improve on functional strength/balance testing (TBD next visit if able) to WNL for agegroup to reduce fall risk   Baseline: TBD  Goal status: INITIAL   PLAN:  PT FREQUENCY: 1x/week  PT DURATION: 8 weeks  PLANNED INTERVENTIONS: 02835- PT Re-evaluation, 97750- Physical Performance Testing, 97110-Therapeutic exercises, 97530- Therapeutic activity, [id number]- Neuromuscular re-education, 97535- Self Care, 02859- Manual therapy, [id number]- Gait training, [phone]- Electrical stimulation (unattended), [id number]- Ultrasound, 02987- Traction (mechanical), [id number]- Ionotophoresis 4mg /ml Dexamethasone , 79439 (1-2 muscles), 20561 (3+ muscles)- Dry Needling, Patient/Family education, Balance training, Stair training, Taping, Joint mobilization, Spinal mobilization, Cryotherapy, Moist heat, and Biofeedback  PLAN FOR NEXT SESSION: Add wall slides/dusting and/or dowel with eccentric lowering, Tband rowing and scapular strengthening, B thoracic sidebending and rotational stretching, advance HEP, evaluate 5x STS and TUG if time   Bryann Mcnealy, PT 12/17/2023, 1:00 PM  Date of referral: 11/20/23 [place] provider: Watt Harlene BROCKS,  MD/ Referring diagnosis? M25.511 (ICD-10-CM) - Acute pain of right shoulder  Treatment diagnosis? (if different than referring diagnosis)  Acute pain of right shoulder due to trauma  Muscle weakness (generalized)  Chronic left-sided thoracic back pain  Abnormal posture  What was this (referring dx) caused by? Felton Hawks of Condition: Initial Onset (within last 3 months)   Laterality: Rt  Current Functional Measure Score: DASH 20.5  Objective measurements identify impairments when they are compared to normal values, the uninvolved extremity, and prior level of function.  [x]  Yes  []  No  Objective assessment of functional ability: Moderate functional limitations   Briefly describe symptoms: R shoulder pain s/p fall with 3-4-/5 strength in the R shoulder, inability to lift any significant weight overhead with R arm, difficulty doing housework, Engineer, manufacturing systems, laundry, any reaching overhead  How did symptoms start: fall 11/10/23  Average pain intensity:  Last 24 hours: 2-3  Past week: 2-3  How often does the  pt experience symptoms? Frequently  How much have the symptoms interfered with usual daily activities? Quite a bit  How has condition changed since care began at this facility? Better  In general, how is the patients overall health? Fair  Onset date: 11/10/23   BACK PAIN (STarT Back Screening Tool) - (When applicable):  Has your back pain spread down your leg(s) at sometime in the last 2 weeks? []  Yes   [x]  No Have you had pain in the shoulder or neck at sometime in the past 2 weeks? [x]  Yes   []  No Have you only walked short distances because of your back pain? []  Yes   [x]  No In the past 2 weeks, have you dressed more slowly than usual because of your back pain? []  Yes   [x]  No Do you think it is not really safe for person with a condition like yours to be physically active? [x]  Yes   []  No Have worrying thoughts been going through your mind a lot of the time? []   Yes   [x]  No Do you feel that your back pain is terrible and it is never going to get any better? []  Yes   [x]  No In general, have you stopped enjoying all the things you usually enjoy? []  Yes   [x]  No Overall, how bothersome has your back pain been in the last 2 weeks? []  Not at all   []  Slightly     [x]  Moderate   []  Very much     []  Extremely

## 2023-12-24 ENCOUNTER — Encounter: Payer: Self-pay | Admitting: Rehabilitation

## 2023-12-24 ENCOUNTER — Ambulatory Visit: Admitting: Rehabilitation

## 2023-12-24 DIAGNOSIS — M25511 Pain in right shoulder: Secondary | ICD-10-CM | POA: Diagnosis not present

## 2023-12-24 DIAGNOSIS — R293 Abnormal posture: Secondary | ICD-10-CM

## 2023-12-24 DIAGNOSIS — M6281 Muscle weakness (generalized): Secondary | ICD-10-CM

## 2023-12-24 DIAGNOSIS — G8929 Other chronic pain: Secondary | ICD-10-CM

## 2023-12-24 DIAGNOSIS — G8911 Acute pain due to trauma: Secondary | ICD-10-CM

## 2023-12-24 DIAGNOSIS — R262 Difficulty in walking, not elsewhere classified: Secondary | ICD-10-CM

## 2023-12-24 NOTE — Therapy (Signed)
 OUTPATIENT PHYSICAL THERAPY SHOULDER TREATMENT   Patient Name: Sheila Schmidt MRN: [id number] DOB:1936/05/26, 88 y.o., female Today's Date: 12/24/2023   END OF SESSION:  PT End of Session - 12/24/23 1034     Visit Number 3    Date for PT Re-Evaluation 02/05/24    Authorization Type [employer] MCR    PT Start Time 1016    PT Stop Time 1109    PT Time Calculation (min) 53 min    Activity Tolerance Patient tolerated treatment well    Behavior During Therapy WFL for tasks assessed/performed           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   Dysrhythmia    Afib   Heart murmur    Hx of adenomatous colonic polyps    Hyperlipidemia    Presence of permanent cardiac pacemaker    Past Surgical History:  Procedure Laterality Date   ATRIAL FLUTTER ABLATION     BIV PACEMAKER INSERTION CRT-P N/A 12/04/2021   Procedure: BIV PACEMAKER INSERTION CRT-P;  Surgeon: Cindie Ole DASEN, MD;  Location: [facility];  Service: Cardiovascular;  Laterality: N/A;   BREAST BIOPSY Left 07/30/2022   US  LT BREAST BX W LOC DEV 1ST LESION IMG BX SPEC US  GUIDE 07/30/2022 [facility]   BREAST BIOPSY Left 09/10/2022   US  LT RADIOACTIVE SEED LOC 09/10/2022 [facility]   BREAST BIOPSY Left 09/10/2022   US  LT RADIOACTIVE SEED LOC 09/10/2022 [facility]   BREAST EXCISIONAL BIOPSY Left 2010   benign   BREAST LUMPECTOMY WITH RADIOACTIVE SEED AND SENTINEL LYMPH NODE BIOPSY Left 09/11/2022   Procedure: LEFT BREAST LUMPECTOMY WITH RADIOACTIVE SEED;  Surgeon: Vanderbilt Ned, MD;  Location: [facility] OR;  Service: General;  Laterality: Left;   CARDIOVERSION  05/2020   in [facility]   CARDIOVERSION N/A 06/21/2021   Procedure: CARDIOVERSION;  Surgeon: Barbaraann Darryle Ned, MD;  Location: [facility] ENDOSCOPY;  Service: Cardiovascular;  Laterality: N/A;   CARDIOVERSION N/A 03/26/2022    Procedure: CARDIOVERSION;  Surgeon: Pietro Redell RAMAN, MD;  Location: [facility] ENDOSCOPY;  Service: Cardiovascular;  Laterality: N/A;   CHOLECYSTECTOMY     CHOLECYSTECTOMY, LAPAROSCOPIC  '06   [name]   KNEE ARTHROSCOPY     Left '00 [place]/ Right '08   lumpectomy- remote     benign   RADIOACTIVE SEED GUIDED AXILLARY SENTINEL LYMPH NODE Left 09/11/2022   Procedure: RADIOACTIVE SEED GUIDED LEFT SENTINEL LYMPH NODE BIOPSY;  Surgeon: Vanderbilt Ned, MD;  Location: [facility] OR;  Service: General;  Laterality: Left;   TOOTH EXTRACTION     TOTAL KNEE ARTHROPLASTY  02/05/2011   left   Patient Active Problem List   Diagnosis Date Noted   Acute respiratory failure with hypoxia (HCC) 07/03/2023   Acute hypoxic respiratory failure (HCC) 07/03/2023   Hypertensive disorder 01/15/2023   Dyslipidemia 01/15/2023   Genetic testing 09/18/2022   Pacemaker [employer] device BiV 09/06/2022   Malignant neoplasm of upper-outer quadrant of left breast in female, estrogen receptor positive (HCC) 08/06/2022   Pure hypercholesterolemia 05/15/2022   Skin cancer 03/07/2022   Closed nondisplaced fracture of distal phalanx of right thumb 02/15/2022   Cancer (HCC) 05/25/2021   Left bundle branch block 04/12/2021   Dilated cardiomyopathy (HCC) 04/12/2021   Vasculitis (HCC) 02/22/2021   Anxiety state 02/12/2021  Body fluid retention 02/12/2021   Cervical intraepithelial neoplasia 02/12/2021   Insomnia 02/12/2021   Pruritus of vulva 02/12/2021   Leg wound, right 10/03/2020   Bilateral primary osteoarthritis of knee 03/30/2019   Paroxysmal atrial fibrillation (HCC) 04/07/2015   Chronic anticoagulation 04/07/2015   Paroxysmal atrial flutter (HCC) 03/16/2015   Essential hypertension, benign 02/24/2014   Sciatica 12/08/2013   Hyperlipidemia 09/25/2013   Osteopenia 11/19/2012   Low back pain 01/26/2010   Carcinoma in situ of breast 09/04/2009   DEPRESSION, PROLONGED 09/04/2009   OA (osteoarthritis) of knee 09/04/2009    PERIPHERAL EDEMA 09/04/2009   History of colonic polyps 09/04/2009    PCP: Copland, Harlene BROCKS, MD   REFERRING PROVIDER: Watt Harlene BROCKS, MD   REFERRING DIAG: M25.511 (ICD-10-CM) - Acute pain of right shoulder   THERAPY DIAG:  Acute pain of right shoulder due to trauma  Muscle weakness (generalized)  Chronic left-sided thoracic back pain  Abnormal posture  Difficulty in walking, not elsewhere classified  RATIONALE FOR EVALUATION AND TREATMENT: Rehabilitation  ONSET DATE: 11/10/23  NEXT MD VISIT:  f/u PRN   SUBJECTIVE:                                                                                                                                                                                                         SUBJECTIVE STATEMENT: 12/24/23: Patient reports her R shoulder pain is better, but now her L hip/SI is hurting since she did some vaccuuming over the weekend.  States it didn't aggravate her R shoulder or L thoracic pain too much, but did bother the L hip/SI.   States has been doing her shoulder HEP and doing ok with it.   Rates R shoulder pain 2/10 today.  Patient is an 88 y/o female referred to PT for R shoulder pain and LBP following a fall 11/10/23 on vacation in the hotel.   States fell coming out of bathroom with floor transition from floor to carpet.  States hit R shoulder against the wall on the way down.  R shoulder pain resulted from the fall.   Patient reports significant bruising that has now resolved.    R shoulder xrays showed no fx's.  She has been using Salonpas with some relief.   LBP is actually more L sided thoracic in nature, and not a new issue.   The pain is intermittent and worse with making her bed in forward flexed reaching position and with vacuuming.    PAIN: Are you having pain? Yes: NPRS scale: R shoulder 0/10 resting, 2-3/10  with certain movements;   L thoracic spine:  0/10 resting; 5-6/10 worst with activities Pain location: R  shoulder Pain description: subtle pinching pain Aggravating factors: elevating the R shoulder into full elevation Relieving factors: not elevating or lifting the R arm  PERTINENT HISTORY:  recurrent L breast CA with node dissection 4/24, dilated cardiomyopathy, Afib, pacemaker, HTN, anxiety, LBP, sciatica, anxiety  PRECAUTIONS: ICD/Pacemaker, Lymphedema precautions LUE due to breast CA  RED FLAGS: None  HAND DOMINANCE: Right  WEIGHT BEARING RESTRICTIONS: No  FALLS:  Has patient fallen in last 6 months? Yes. Number of falls 1  LIVING ENVIRONMENT: Lives with: lives with their daughter Lives in: House/apartment Stairs: Yes: Internal: 12 steps; on right going up and External: 4 steps; on right going up Has following equipment at home: Single point cane and Walker - 2 wheeled  OCCUPATION: retired from [employer], corporate work  PLOF: Independent with community mobility with device  PATIENT GOALS: get my shoulder pain resolved, and walk better on the R knee (OA)   OBJECTIVE: (objective measures completed at initial evaluation unless otherwise dated)  DIAGNOSTIC FINDINGS:  CLINICAL DATA:  Right shoulder pain after fall 5 days ago.   EXAM: RIGHT SHOULDER - 2+ VIEW   COMPARISON:  None Available.   FINDINGS: There is no evidence of fracture or dislocation. There is no evidence of arthropathy or other focal bone abnormality. Soft tissues are unremarkable.   IMPRESSION: Negative.     Electronically Signed   By: Lynwood Landy Raddle M.D.   On: 11/15/2023 15:45  PATIENT SURVEYS:  Quick Dash   COGNITION: Overall cognitive status: Within functional limits for tasks assessed     SENSATION: WFL  POSTURE: rounded shoulders, forward head, and R scoliosis  UPPER EXTREMITY ROM:   Active ROM Right eval Left eval  Shoulder flexion 160 165  Shoulder extension 70 70  Shoulder abduction 150 165  Shoulder adduction    Shoulder internal rotation 100 100  Shoulder external rotation  100 110  Elbow flexion    Elbow extension    Wrist flexion    Wrist extension    Wrist ulnar deviation    Wrist radial deviation    Wrist pronation    Wrist supination    (Blank rows = not tested)  UPPER EXTREMITY MMT:  MMT Right eval Left eval  Shoulder flexion 4- p! 5  Shoulder extension 4- 4  Shoulder abduction 4- p! 5  Shoulder adduction    Shoulder internal rotation 4 5  Shoulder external rotation 3- p! 5  Middle trapezius    Lower trapezius    Elbow flexion    Elbow extension    Wrist flexion    Wrist extension    Wrist ulnar deviation    Wrist radial deviation    Wrist pronation    Wrist supination    Grip strength (lbs)    (Blank rows = not tested) LUMBAR ROM:   Active  A/PROM  eval  Flexion 90% ROM  Extension 90% ROM  Right lateral flexion To knee jt  Left lateral flexion Above knee jt  Right rotation 90%  Left rotation 75%   (Blank rows = not tested)   SHOULDER SPECIAL TESTS: Impingement tests: Neer impingement test: positive , Hawkins/Kennedy impingement test: negative, and Painful arc test: negative SLAP lesions: Biceps load test: positive  Instability tests: Apprehension test: negative Rotator cuff assessment: Drop arm test: negative, Empty can test: positive , Full can test: positive , and Gerber lift off test:  negative Biceps assessment: Speed's test: positive   LUMBAR SPECIAL TESTS:  Straight leg raise test: Negative   SLR RLE is 70 deg, LLE is 80 deg  JOINT MOBILITY TESTING:  hypermobile Bilateral shoulder joint accessory motions  PALPATION:  TTP over the R greater tuberosity, nontender over coracoid and posterior R shoulder;     TTP over L T-10 and T11 facets and paraspinals    TODAY'S TREATMENT:  UBE L1 x 3'F/3'B  Supine cross over piriformis stretch x 1' x 3 RLE Supine FABER piriformis stretch x 1' x 2 RLE Supine ER x 20 RUE Supine circles at 90 CW AND CCW  x 20 ea RUE Supine press up x 20 RUE Wand shoulder flexion 2/10  BUE SL ER x 20 RUE with PT assist on concentric phase and pt. Performing eccentric with slight PT assist SL HABD x 20 RUE Seated ER at 90/90 x 20 RUE  Standing rowing GTB x 20 RUE Standing shoulder ext RTB x 20 RUE  MANUAL THERAPY: To promote reduced pain utilizing manual TP therapy. MFR x 10' to L UT Tp  R UT stretch with manual PT assist and overpressure x 1' x 2    12/17/23 UBE x 5' F/B HEP review: SL HABD Supine circles at 90 CW/CCW Supine active ER Seated active ER Seated ER at 90 deg abd  SL ER attempted but unable Standing row GTB x 20 RUE Standing shoulder ext RTB x 20 RUE Wall dusting up/down x 2/10 RUE Wall dusting CCW x 10 ea RUE  MANUAL THERAPY: To promote normalized muscle tension, improved flexibility, and reduced pain utilizing manual TP therapy. Seated intermittent R sidebending stretching with arm reaching overhead with some R trunk rotation with maintained deep pressure to R thoracic paraspinal Tp during stretch and with relaxation phase  12/11/23 SELF CARE: Provided education lifting with BUE to decrease pain, full can position of R shoulder with lifting.    PATIENT EDUCATION:  Education details: PT eval findings, anticipated POC, initial HEP, postural awareness, and posture and body mechanics for typical daily postioning, mobility and household tasks  Person educated: Patient Education method: Explanation, Demonstration, Verbal cues, Tactile cues, and Handouts Education comprehension: verbalized understanding, verbal cues required, tactile cues required, and needs further education  HOME EXERCISE PROGRAM: Access Code: [email or web address] URL: [email or web address] Date: 12/11/2023 Prepared by: Garnette Montclair  Exercises - Sidelying Shoulder Horizontal Abduction  - 1 x daily - 7 x weekly - 1 sets - 20 reps - Supine Shoulder Circles  - 1 x daily - 7 x weekly - 1 sets - 20 reps - Seated Shoulder External Rotation AAROM with Dowel  - 1 x daily - 7  x weekly - 1 sets - 20 reps - Seated Single Arm Shoulder External Rotation with Towel  - 1 x daily - 7 x weekly - 1 sets - 20 reps - Seated Shoulder External Rotation AROM in Supported Abduction  - 1 x daily - 7 x weekly - 1 sets - 20 reps   ASSESSMENT:  CLINICAL IMPRESSION: Patient still  with significant R shoulder ER weakness and focus today is on increasing ER strength.   Added in some antigravity shoulder flexion activity with wall slides.  She is able to do seated ER today without the dowel/cane assisting for the first 10 reps so she is gradually improving in strength.    Paris Hohn Ritzel is a 88 y.o. female who was referred to physical therapy for evaluation and treatment for R  shoulder pain and L lower thoracic pain.    Patient reports onset of R shoulder pain beginning approximately 1 month ago s/p fall. Pain is worse with reaching overhead, lifting any weight with the RUE past shoulder level, and haircare.  Patient has deficits in lumbar flexibility, R shoulder strength, abnormal posture, and TTP with abnormal muscle tension R shoulder and L thoracic spine/BLE which are interfering with ADLs and are impacting quality of life.  On QuickDASH patient scored 20.5/100 demonstrating 20.5% disability.  Dorinne will benefit from skilled PT to address above deficits to improve mobility and activity tolerance with decreased pain interference.   OBJECTIVE IMPAIRMENTS: Abnormal gait, decreased balance, decreased strength, and postural dysfunction.   ACTIVITY LIMITATIONS: lifting, stairs, reach over head, and locomotion level  PARTICIPATION LIMITATIONS: cleaning, laundry, and making bed, vacuuming  PERSONAL FACTORS: Age and 1-2 comorbidities: recurrent L breast CA with node dissection 4/24, dilated cardiomyopathy, Afib, pacemaker, HTN, anxiety, LBP, anxiety are also affecting patient's functional outcome.   REHAB POTENTIAL: Good  CLINICAL DECISION MAKING: Evolving/moderate complexity  EVALUATION  COMPLEXITY: Moderate   GOALS: Goals reviewed with patient? Yes  SHORT TERM GOALS: Target date: 01/08/2024  Patient will be independent with initial HEP to improve outcomes and carryover.  Baseline: 100% PT assistance required for correct completion Goal status: INITIAL  2.  Patient will report 25% improvement in R shoulder pain and L thoracic pain to improve QOL.   Baseline: R shoulder 2-3/10; L thoracic spine 5-6/10 Goal status: INITIAL   LONG TERM GOALS: Target date: 02/05/2024  Patient will be independent with ongoing/advanced HEP for self-management at home.  Baseline: not doing advanced HEP yet Goal status: INITIAL  2.  Patient will report 50-75% improvement in R shoulder and L thoracic pain to improve QOL.  Baseline: R shoulder 2-3/10; L thoracic 5-6/10 Goal status: INITIAL  3.  Patient to demonstrate improved upright posture with posterior shoulder girdle engaged to promote improved glenohumeral joint mobility. Baseline: forward head/rounded shoulders Goal status: INITIAL  4.  Patient to improve L thoracic sidebending and rotation to equal bilaterally/90% without pain provocation to allow for increased ease of ADLs.  Baseline: Refer to above lumbar ROM table Goal status: INITIAL  5.  Patient will demonstrate improved R shoulder strength to >/= 4+/5 for functional UE use. Baseline: Refer to above UE MMT table Goal status: INITIAL  6  Patient will report </= 5%% on QuickDASH (MCID = 14%) to demonstrate improved functional ability.  Baseline: 20.5 Goal status: INITIAL  7.  Patient will report no falls and improve on functional strength/balance testing (TBD next visit if able) to WNL for agegroup to reduce fall risk   Baseline: TBD  Goal status: INITIAL   PLAN:  PT FREQUENCY: 1x/week  PT DURATION: 8 weeks  PLANNED INTERVENTIONS: 97164- PT Re-evaluation, 97750- Physical Performance Testing, 97110-Therapeutic exercises, 97530- Therapeutic activity, 97112-  Neuromuscular re-education, 97535- Self Care, 02859- Manual therapy, [id number]- Gait training, [phone]- Electrical stimulation (unattended), [id number]- Ultrasound, 02987- Traction (mechanical), [id number]- Ionotophoresis 4mg /ml Dexamethasone , 79439 (1-2 muscles), 20561 (3+ muscles)- Dry Needling, Patient/Family education, Balance training, Stair training, Taping, Joint mobilization, Spinal mobilization, Cryotherapy, Moist heat, and Biofeedback  PLAN FOR NEXT SESSION: Continue R shoulder strengthening focusing on elevation and ER.   evaluate 5x STS and TUG if time   Jaque Dacy, PT 12/24/2023, 12:31 PM  Date of referral: 11/20/23 [place] provider: Watt Harlene BROCKS, MD/ Referring diagnosis? M25.511 (ICD-10-CM) - Acute pain of right shoulder  Treatment diagnosis? (if different than referring diagnosis)  Acute pain of right shoulder due to trauma  Muscle weakness (generalized)  Chronic left-sided thoracic back pain  Abnormal posture  Difficulty in walking, not elsewhere classified  What was this (referring dx) caused by? Felton Hawks of Condition: Initial Onset (within last 3 months)   Laterality: Rt  Current Functional Measure Score: DASH 20.5  Objective measurements identify impairments when they are compared to normal values, the uninvolved extremity, and prior level of function.  [x]  Yes  []  No  Objective assessment of functional ability: Moderate functional limitations   Briefly describe symptoms: R shoulder pain s/p fall with 3-4-/5 strength in the R shoulder, inability to lift any significant weight overhead with R arm, difficulty doing housework, Engineer, manufacturing systems, laundry, any reaching overhead  How did symptoms start: fall 11/10/23  Average pain intensity:  Last 24 hours: 2-3  Past week: 2-3  How often does the pt experience symptoms? Frequently  How much have the symptoms interfered with usual daily activities? Quite a bit  How has condition changed since care began at this  facility? Better  In general, how is the patients overall health? Fair  Onset date: 11/10/23   BACK PAIN (STarT Back Screening Tool) - (When applicable):  Has your back pain spread down your leg(s) at sometime in the last 2 weeks? []  Yes   [x]  No Have you had pain in the shoulder or neck at sometime in the past 2 weeks? [x]  Yes   []  No Have you only walked short distances because of your back pain? []  Yes   [x]  No In the past 2 weeks, have you dressed more slowly than usual because of your back pain? []  Yes   [x]  No Do you think it is not really safe for person with a condition like yours to be physically active? [x]  Yes   []  No Have worrying thoughts been going through your mind a lot of the time? []  Yes   [x]  No Do you feel that your back pain is terrible and it is never going to get any better? []  Yes   [x]  No In general, have you stopped enjoying all the things you usually enjoy? []  Yes   [x]  No Overall, how bothersome has your back pain been in the last 2 weeks? []  Not at all   []  Slightly     [x]  Moderate   []  Very much     []  Extremely

## 2024-01-01 ENCOUNTER — Ambulatory Visit: Admitting: Rehabilitation

## 2024-01-01 DIAGNOSIS — M6281 Muscle weakness (generalized): Secondary | ICD-10-CM

## 2024-01-01 DIAGNOSIS — M25511 Pain in right shoulder: Secondary | ICD-10-CM | POA: Diagnosis not present

## 2024-01-01 DIAGNOSIS — G8929 Other chronic pain: Secondary | ICD-10-CM

## 2024-01-01 DIAGNOSIS — G8911 Acute pain due to trauma: Secondary | ICD-10-CM

## 2024-01-01 NOTE — Therapy (Signed)
 OUTPATIENT PHYSICAL THERAPY SHOULDER TREATMENT   Patient Name: Sheila Schmidt MRN: [id number] DOB:Sep 29, 1935, 88 y.o., female Today's Date: 01/01/2024   END OF SESSION:  PT End of Session - 01/01/24 1156     Visit Number 4    Date for PT Re-Evaluation 02/05/24    Authorization Type U[employer] MCR    PT Start Time 1152    Activity Tolerance Patient tolerated treatment well    Behavior During Therapy WFL for tasks assessed/performed            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   Dysrhythmia    Afib   Heart murmur    Hx of adenomatous colonic polyps    Hyperlipidemia    Presence of permanent cardiac pacemaker    Past Surgical History:  Procedure Laterality Date   ATRIAL FLUTTER ABLATION     BIV PACEMAKER INSERTION CRT-P N/A 12/04/2021   Procedure: BIV PACEMAKER INSERTION CRT-P;  Surgeon: Cindie Ole DASEN, MD;  Location: [facility];  Service: Cardiovascular;  Laterality: N/A;   BREAST BIOPSY Left 07/30/2022   US  LT BREAST BX W LOC DEV 1ST LESION IMG BX SPEC US  GUIDE 07/30/2022 [facility]   BREAST BIOPSY Left 09/10/2022   US  LT RADIOACTIVE SEED LOC 09/10/2022 [facility]   BREAST BIOPSY Left 09/10/2022   US  LT RADIOACTIVE SEED LOC 09/10/2022 [facility]   BREAST EXCISIONAL BIOPSY Left 2010   benign   BREAST LUMPECTOMY WITH RADIOACTIVE SEED AND SENTINEL LYMPH NODE BIOPSY Left 09/11/2022   Procedure: LEFT BREAST LUMPECTOMY WITH RADIOACTIVE SEED;  Surgeon: Vanderbilt Ned, MD;  Location: [facility] OR;  Service: General;  Laterality: Left;   CARDIOVERSION  05/2020   in [facility]   CARDIOVERSION N/A 06/21/2021   Procedure: CARDIOVERSION;  Surgeon: Barbaraann Darryle Ned, MD;  Location: [facility] ENDOSCOPY;  Service: Cardiovascular;  Laterality: N/A;   CARDIOVERSION N/A 03/26/2022   Procedure: CARDIOVERSION;  Surgeon: Pietro Redell RAMAN,  MD;  Location: [facility] ENDOSCOPY;  Service: Cardiovascular;  Laterality: N/A;   CHOLECYSTECTOMY     CHOLECYSTECTOMY, LAPAROSCOPIC  '06   [name]   KNEE ARTHROSCOPY     Left '00 [place]/ Right '08   lumpectomy- remote     benign   RADIOACTIVE SEED GUIDED AXILLARY SENTINEL LYMPH NODE Left 09/11/2022   Procedure: RADIOACTIVE SEED GUIDED LEFT SENTINEL LYMPH NODE BIOPSY;  Surgeon: Vanderbilt Ned, MD;  Location: [facility] OR;  Service: General;  Laterality: Left;   TOOTH EXTRACTION     TOTAL KNEE ARTHROPLASTY  02/05/2011   left   Patient Active Problem List   Diagnosis Date Noted   Acute respiratory failure with hypoxia (HCC) 07/03/2023   Acute hypoxic respiratory failure (HCC) 07/03/2023   Hypertensive disorder 01/15/2023   Dyslipidemia 01/15/2023   Genetic testing 09/18/2022   Pacemaker Abbott device BiV 09/06/2022   Malignant neoplasm of upper-outer quadrant of left breast in female, estrogen receptor positive (HCC) 08/06/2022   Pure hypercholesterolemia 05/15/2022   Skin cancer 03/07/2022   Closed nondisplaced fracture of distal phalanx of right thumb 02/15/2022   Cancer (HCC) 05/25/2021   Left bundle branch block 04/12/2021   Dilated cardiomyopathy (HCC) 04/12/2021   Vasculitis (HCC) 02/22/2021   Anxiety state 02/12/2021   Body fluid retention 02/12/2021   Cervical intraepithelial neoplasia 02/12/2021   Insomnia  02/12/2021   Pruritus of vulva 02/12/2021   Leg wound, right 10/03/2020   Bilateral primary osteoarthritis of knee 03/30/2019   Paroxysmal atrial fibrillation (HCC) 04/07/2015   Chronic anticoagulation 04/07/2015   Paroxysmal atrial flutter (HCC) 03/16/2015   Essential hypertension, benign 02/24/2014   Sciatica 12/08/2013   Hyperlipidemia 09/25/2013   Osteopenia 11/19/2012   Low back pain 01/26/2010   Carcinoma in situ of breast 09/04/2009   DEPRESSION, PROLONGED 09/04/2009   OA (osteoarthritis) of knee 09/04/2009   PERIPHERAL EDEMA 09/04/2009   History of colonic polyps  09/04/2009    PCP: Watt Harlene BROCKS, MD   REFERRING PROVIDER: Watt Harlene BROCKS, MD   REFERRING DIAG: M25.511 (ICD-10-CM) - Acute pain of right shoulder   THERAPY DIAG:  Acute pain of right shoulder due to trauma  Muscle weakness (generalized)  Chronic left-sided thoracic back pain  RATIONALE FOR EVALUATION AND TREATMENT: Rehabilitation  ONSET DATE: 11/10/23  NEXT MD VISIT:  f/u PRN   SUBJECTIVE:                                                                                                                                                                                                         SUBJECTIVE STATEMENT: 7/23:  Patient states R shoulder feels about the same and rates pain 4/10.  She is more bothered by her back pain on the L today and rates this 7/10.  States she didn't do any vaccuming due to the pain and had her dtr to do it for her.  Patient is an 88 y/o female referred to PT for R shoulder pain and LBP following a fall 11/10/23 on vacation in the hotel.   States fell coming out of bathroom with floor transition from floor to carpet.  States hit R shoulder against the wall on the way down.  R shoulder pain resulted from the fall.   Patient reports significant bruising that has now resolved.    R shoulder xrays showed no fx's.  She has been using Salonpas with some relief.   LBP is actually more L sided thoracic in nature, and not a new issue.   The pain is intermittent and worse with making her bed in forward flexed reaching position and with vacuuming.    PAIN: Are you having pain? Yes: NPRS scale: R shoulder 0/10 resting, 2-3/10 with certain movements;   L thoracic spine:  0/10 resting; 5-6/10 worst with activities Pain location: R shoulder Pain description: subtle pinching pain Aggravating factors: elevating the R shoulder into full elevation Relieving factors: not elevating or  lifting the R arm  PERTINENT HISTORY:  recurrent L breast CA with node dissection  4/24, dilated cardiomyopathy, Afib, pacemaker, HTN, anxiety, LBP, sciatica, anxiety  PRECAUTIONS: ICD/Pacemaker, Lymphedema precautions LUE due to breast CA  RED FLAGS: None  HAND DOMINANCE: Right  WEIGHT BEARING RESTRICTIONS: No  FALLS:  Has patient fallen in last 6 months? Yes. Number of falls 1  LIVING ENVIRONMENT: Lives with: lives with their daughter Lives in: House/apartment Stairs: Yes: Internal: 12 steps; on right going up and External: 4 steps; on right going up Has following equipment at home: Single point cane and Walker - 2 wheeled  OCCUPATION: retired from [employer], corporate work  PLOF: Independent with community mobility with device  PATIENT GOALS: get my shoulder pain resolved, and walk better on the R knee (OA)   OBJECTIVE: (objective measures completed at initial evaluation unless otherwise dated)  DIAGNOSTIC FINDINGS:  CLINICAL DATA:  Right shoulder pain after fall 5 days ago.   EXAM: RIGHT SHOULDER - 2+ VIEW   COMPARISON:  None Available.   FINDINGS: There is no evidence of fracture or dislocation. There is no evidence of arthropathy or other focal bone abnormality. Soft tissues are unremarkable.   IMPRESSION: Negative.     Electronically Signed   By: Lynwood Landy Raddle M.D.   On: 11/15/2023 15:45  PATIENT SURVEYS:  Quick Dash   COGNITION: Overall cognitive status: Within functional limits for tasks assessed     SENSATION: WFL  POSTURE: rounded shoulders, forward head, and R scoliosis  UPPER EXTREMITY ROM:   Active ROM Right eval Left eval  Shoulder flexion 160 165  Shoulder extension 70 70  Shoulder abduction 150 165  Shoulder adduction    Shoulder internal rotation 100 100  Shoulder external rotation 100 110  Elbow flexion    Elbow extension    Wrist flexion    Wrist extension    Wrist ulnar deviation    Wrist radial deviation    Wrist pronation    Wrist supination    (Blank rows = not tested)  UPPER EXTREMITY MMT:  MMT  Right eval Left eval  Shoulder flexion 4- p! 5  Shoulder extension 4- 4  Shoulder abduction 4- p! 5  Shoulder adduction    Shoulder internal rotation 4 5  Shoulder external rotation 3- p! 5  Middle trapezius    Lower trapezius    Elbow flexion    Elbow extension    Wrist flexion    Wrist extension    Wrist ulnar deviation    Wrist radial deviation    Wrist pronation    Wrist supination    Grip strength (lbs)    (Blank rows = not tested) LUMBAR ROM:   Active  A/PROM  eval  Flexion 90% ROM  Extension 90% ROM  Right lateral flexion To knee jt  Left lateral flexion Above knee jt  Right rotation 90%  Left rotation 75%   (Blank rows = not tested)   SHOULDER SPECIAL TESTS: Impingement tests: Neer impingement test: positive , Hawkins/Kennedy impingement test: negative, and Painful arc test: negative SLAP lesions: Biceps load test: positive  Instability tests: Apprehension test: negative Rotator cuff assessment: Drop arm test: negative, Empty can test: positive , Full can test: positive , and Gerber lift off test: negative Biceps assessment: Speed's test: positive   LUMBAR SPECIAL TESTS:  Straight leg raise test: Negative   SLR RLE is 70 deg, LLE is 80 deg  JOINT MOBILITY TESTING:  hypermobile Bilateral shoulder joint accessory  motions  PALPATION:  TTP over the R greater tuberosity, nontender over coracoid and posterior R shoulder;     TTP over L T-10 and T11 facets and paraspinals    TODAY'S TREATMENT:  01/01/24 THERAPEUTIC EXERCISE: To improve strength.  Demonstration, verbal and tactile cues throughout for technique.  THERAPEUTIC ACTIVITIES: To improve functional performance.  Demonstration, verbal and tactile cues throughout for technique. Supine LTR x 20 B Bridge x 10 B Manual SLR hamstretch x 1' x 2 KTOS piriformis stretch x 1' x 2 LLE Standing hip abd x 2/10 LLE; hip ext x 2/10 LLE Standing counter back extension x 2/10 BLE  NEUROMUSCULAR RE-EDUCATION: To  improve posture. Seated green swiss ball roll outs to R x 10, Forward x 10  Gaenslen's test stretch with LLE off table and R SKTC x 1' with some MET for posterior L inominate  MANUAL THERAPY: To promote reduced pain utilizing therapeutic massage. Theragun to L low back from L1-5 transverse processes laterally to quadratus and distal to PSIS Pelvic shotgun x 2 without any pop LLE long leg manual traction with grade 3-4 pulls x 2/10  12/17/23 UBE L1 x 3'F/3'B  Supine cross over piriformis stretch x 1' x 3 RLE Supine FABER piriformis stretch x 1' x 2 RLE Supine ER x 20 RUE Supine circles at 90 CW AND CCW  x 20 ea RUE Supine press up x 20 RUE Wand shoulder flexion 2/10 BUE SL ER x 20 RUE with PT assist on concentric phase and pt. Performing eccentric with slight PT assist SL HABD x 20 RUE Seated ER at 90/90 x 20 RUE  Standing rowing GTB x 20 RUE Standing shoulder ext RTB x 20 RUE  MANUAL THERAPY: To promote reduced pain utilizing manual TP therapy. MFR x 10' to L UT Tp  R UT stretch with manual PT assist and overpressure x 1' x 2    12/17/23 UBE x 5' F/B HEP review: SL HABD Supine circles at 90 CW/CCW Supine active ER Seated active ER Seated ER at 90 deg abd  SL ER attempted but unable Standing row GTB x 20 RUE Standing shoulder ext RTB x 20 RUE Wall dusting up/down x 2/10 RUE Wall dusting CCW x 10 ea RUE  MANUAL THERAPY: To promote normalized muscle tension, improved flexibility, and reduced pain utilizing manual TP therapy. Seated intermittent R sidebending stretching with arm reaching overhead with some R trunk rotation with maintained deep pressure to R thoracic paraspinal Tp during stretch and with relaxation phase  12/11/23 SELF CARE: Provided education lifting with BUE to decrease pain, full can position of R shoulder with lifting.    PATIENT EDUCATION:  Education details: PT eval findings, anticipated POC, initial HEP, postural awareness, and posture and body  mechanics for typical daily postioning, mobility and household tasks  Person educated: Patient Education method: Explanation, Demonstration, Verbal cues, Tactile cues, and Handouts Education comprehension: verbalized understanding, verbal cues required, tactile cues required, and needs further education  HOME EXERCISE PROGRAM: Access Code: [email or web address] URL: [email or web address] Date: 12/11/2023 Prepared by: Garnette Montclair  Exercises - Sidelying Shoulder Horizontal Abduction  - 1 x daily - 7 x weekly - 1 sets - 20 reps - Supine Shoulder Circles  - 1 x daily - 7 x weekly - 1 sets - 20 reps - Seated Shoulder External Rotation AAROM with Dowel  - 1 x daily - 7 x weekly - 1 sets - 20 reps - Seated Single Arm Shoulder External Rotation with Towel  -  1 x daily - 7 x weekly - 1 sets - 20 reps - Seated Shoulder External Rotation AROM in Supported Abduction  - 1 x daily - 7 x weekly - 1 sets - 20 reps   ASSESSMENT:  CLINICAL IMPRESSION: Focus today is on L lower back pain just above the L PSIS and up to L1 along transverse processes.  Patient requests to work on this problem rather than the R shoulder today since she is having more difficulty with it.  She does have a slightly shorter LLE in supine.  Tried posterior inominate MET for LLE f/b traction on the LLE.  Will see whether this helps or not  SHARNEE DOUGLASS is a 88 y.o. female who was referred to physical therapy for evaluation and treatment for R shoulder pain and L lower thoracic pain.    Patient reports onset of R shoulder pain beginning approximately 1 month ago s/p fall. Pain is worse with reaching overhead, lifting any weight with the RUE past shoulder level, and haircare.  Patient has deficits in lumbar flexibility, R shoulder strength, abnormal posture, and TTP with abnormal muscle tension R shoulder and L thoracic spine/BLE which are interfering with ADLs and are impacting quality of life.  On QuickDASH patient scored  20.5/100 demonstrating 20.5% disability.  Seva will benefit from skilled PT to address above deficits to improve mobility and activity tolerance with decreased pain interference.   OBJECTIVE IMPAIRMENTS: Abnormal gait, decreased balance, decreased strength, and postural dysfunction.   ACTIVITY LIMITATIONS: lifting, stairs, reach over head, and locomotion level  PARTICIPATION LIMITATIONS: cleaning, laundry, and making bed, vacuuming  PERSONAL FACTORS: Age and 1-2 comorbidities: recurrent L breast CA with node dissection 4/24, dilated cardiomyopathy, Afib, pacemaker, HTN, anxiety, LBP, anxiety are also affecting patient's functional outcome.   REHAB POTENTIAL: Good  CLINICAL DECISION MAKING: Evolving/moderate complexity  EVALUATION COMPLEXITY: Moderate   GOALS: Goals reviewed with patient? Yes  SHORT TERM GOALS: Target date: 01/08/2024  Patient will be independent with initial HEP to improve outcomes and carryover.  Baseline: 100% PT assistance required for correct completion Goal status: MET 01/01/24  2.  Patient will report 25% improvement in R shoulder pain and L thoracic pain to improve QOL.   Baseline: R shoulder 2-3/10; L thoracic spine 5-6/10 Goal status: INITIAL   LONG TERM GOALS: Target date: 02/05/2024  Patient will be independent with ongoing/advanced HEP for self-management at home.  Baseline: advancing HEP 01/01/24 Goal status: IN PROGRESS  2.  Patient will report 50-75% improvement in R shoulder and L thoracic pain to improve QOL.  Baseline: R shoulder 2-3/10; L thoracic 5-6/10 Goal status: IN PROGRESS  3.  Patient to demonstrate improved upright posture with posterior shoulder girdle engaged to promote improved glenohumeral joint mobility. Baseline: forward head/rounded shoulders Goal status: INITIAL  4.  Patient to improve L thoracic sidebending and rotation to equal bilaterally/90% without pain provocation to allow for increased ease of ADLs.  Baseline: Refer  to above lumbar ROM table Goal status: INITIAL  5.  Patient will demonstrate improved R shoulder strength to >/= 4+/5 for functional UE use. Baseline: Refer to above UE MMT table Goal status: INITIAL  6  Patient will report </= 5%% on QuickDASH (MCID = 14%) to demonstrate improved functional ability.  Baseline: 20.5 Goal status: INITIAL  7.  Patient will report no falls and improve on functional strength/balance testing (TBD next visit if able) to WNL for agegroup to reduce fall risk   Baseline: TBD  Goal  status: INITIAL   PLAN:  PT FREQUENCY: 1x/week  PT DURATION: 8 weeks  PLANNED INTERVENTIONS: 97164- PT Re-evaluation, 97750- Physical Performance Testing, 97110-Therapeutic exercises, 97530- Therapeutic activity, [id number]- Neuromuscular re-education, 97535- Self Care, [id number]- Manual therapy, [phone]- Gait training, [phone]- Electrical stimulation (unattended), [id number]- Ultrasound, [id number]- Traction (mechanical), [id number]- Ionotophoresis 4mg /ml Dexamethasone , [id number] (1-2 muscles), [id number] (3+ muscles)- Dry Needling, Patient/Family education, Balance training, Stair training, Taping, Joint mobilization, Spinal mobilization, Cryotherapy, Moist heat, and Biofeedback  PLAN FOR NEXT SESSION: Continue lumbar/LLE  strettching and strengthening.   Continue with R shoulder strengthening if patient is willing.   Assess how MET and STM affected the L LBP.   evaluate 5x STS and TUG if time   Payam Gribble, PT 01/01/2024, 11:57 AM  Date of referral: 11/20/23 [place] provider: Watt Harlene BROCKS, MD/ Referring diagnosis? M25.511 (ICD-10-CM) - Acute pain of right shoulder  Treatment diagnosis? (if different than referring diagnosis)  Acute pain of right shoulder due to trauma  Muscle weakness (generalized)  Chronic left-sided thoracic back pain  What was this (referring dx) caused by? Felton Hawks of Condition: Initial Onset (within last 3 months)   Laterality: Rt  Current Functional Measure Score:  DASH 20.5  Objective measurements identify impairments when they are compared to normal values, the uninvolved extremity, and prior level of function.  [x]  Yes  []  No  Objective assessment of functional ability: Moderate functional limitations   Briefly describe symptoms: R shoulder pain s/p fall with 3-4-/5 strength in the R shoulder, inability to lift any significant weight overhead with R arm, difficulty doing housework, Engineer, manufacturing systems, laundry, any reaching overhead  How did symptoms start: fall 11/10/23  Average pain intensity:  Last 24 hours: 2-3  Past week: 2-3  How often does the pt experience symptoms? Frequently  How much have the symptoms interfered with usual daily activities? Quite a bit  How has condition changed since care began at this facility? Better  In general, how is the patients overall health? Fair  Onset date: 11/10/23   BACK PAIN (STarT Back Screening Tool) - (When applicable):  Has your back pain spread down your leg(s) at sometime in the last 2 weeks? []  Yes   [x]  No Have you had pain in the shoulder or neck at sometime in the past 2 weeks? [x]  Yes   []  No Have you only walked short distances because of your back pain? []  Yes   [x]  No In the past 2 weeks, have you dressed more slowly than usual because of your back pain? []  Yes   [x]  No Do you think it is not really safe for person with a condition like yours to be physically active? [x]  Yes   []  No Have worrying thoughts been going through your mind a lot of the time? []  Yes   [x]  No Do you feel that your back pain is terrible and it is never going to get any better? []  Yes   [x]  No In general, have you stopped enjoying all the things you usually enjoy? []  Yes   [x]  No Overall, how bothersome has your back pain been in the last 2 weeks? []  Not at all   []  Slightly     [x]  Moderate   []  Very much     []  Extremely

## 2024-01-08 ENCOUNTER — Ambulatory Visit: Admitting: Rehabilitation

## 2024-01-08 DIAGNOSIS — G8911 Acute pain due to trauma: Secondary | ICD-10-CM

## 2024-01-08 DIAGNOSIS — M25511 Pain in right shoulder: Secondary | ICD-10-CM | POA: Diagnosis not present

## 2024-01-08 DIAGNOSIS — R262 Difficulty in walking, not elsewhere classified: Secondary | ICD-10-CM

## 2024-01-08 DIAGNOSIS — M6281 Muscle weakness (generalized): Secondary | ICD-10-CM

## 2024-01-08 DIAGNOSIS — G8929 Other chronic pain: Secondary | ICD-10-CM

## 2024-01-08 DIAGNOSIS — R293 Abnormal posture: Secondary | ICD-10-CM

## 2024-01-08 NOTE — Therapy (Signed)
 OUTPATIENT PHYSICAL THERAPY SHOULDER TREATMENT   Patient Name: Sheila Schmidt MRN: [id number] DOB:[birth year], 88 y.o., female Today's Date: 01/08/2024   END OF SESSION:  PT End of Session - 01/08/24 1110     Date for PT Re-Evaluation 02/05/24    Authorization Type U[employer] MCR    Activity Tolerance Patient tolerated treatment well    Behavior During Therapy [facility] for tasks assessed/performed            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   Dysrhythmia    Afib   Heart murmur    Hx of adenomatous colonic polyps    Hyperlipidemia    Presence of permanent cardiac pacemaker    Past Surgical History:  Procedure Laterality Date   ATRIAL FLUTTER ABLATION     BIV PACEMAKER INSERTION CRT-P N/A 12/04/2021   Procedure: BIV PACEMAKER INSERTION CRT-P;  Surgeon: Cindie Ole DASEN, MD;  Location: [facility];  Service: Cardiovascular;  Laterality: N/A;   BREAST BIOPSY Left 07/30/2022   US  LT BREAST BX W LOC DEV 1ST LESION IMG BX SPEC US  GUIDE 07/30/2022 [facility]   BREAST BIOPSY Left 09/10/2022   US  LT RADIOACTIVE SEED LOC 09/10/2022 [facility]   BREAST BIOPSY Left 09/10/2022   US  LT RADIOACTIVE SEED LOC 09/10/2022 [facility]   BREAST EXCISIONAL BIOPSY Left 2010   benign   BREAST LUMPECTOMY WITH RADIOACTIVE SEED AND SENTINEL LYMPH NODE BIOPSY Left 09/11/2022   Procedure: LEFT BREAST LUMPECTOMY WITH RADIOACTIVE SEED;  Surgeon: Vanderbilt Ned, MD;  Location: [facility] OR;  Service: General;  Laterality: Left;   CARDIOVERSION  05/2020   in [facility]   CARDIOVERSION N/A 06/21/2021   Procedure: CARDIOVERSION;  Surgeon: Barbaraann Darryle Ned, MD;  Location: [facility] ENDOSCOPY;  Service: Cardiovascular;  Laterality: N/A;   CARDIOVERSION N/A 03/26/2022   Procedure: CARDIOVERSION;  Surgeon: Pietro Redell RAMAN, MD;  Location: [facility] ENDOSCOPY;  Service:  Cardiovascular;  Laterality: N/A;   CHOLECYSTECTOMY     CHOLECYSTECTOMY, LAPAROSCOPIC  '06   [name]   KNEE ARTHROSCOPY     Left '00 [place]/ Right '08   lumpectomy- remote     benign   RADIOACTIVE SEED GUIDED AXILLARY SENTINEL LYMPH NODE Left 09/11/2022   Procedure: RADIOACTIVE SEED GUIDED LEFT SENTINEL LYMPH NODE BIOPSY;  Surgeon: Vanderbilt Ned, MD;  Location: [facility] OR;  Service: General;  Laterality: Left;   TOOTH EXTRACTION     TOTAL KNEE ARTHROPLASTY  02/05/2011   left   Patient Active Problem List   Diagnosis Date Noted   Acute respiratory failure with hypoxia (HCC) 07/03/2023   Acute hypoxic respiratory failure (HCC) 07/03/2023   Hypertensive disorder 01/15/2023   Dyslipidemia 01/15/2023   Genetic testing 09/18/2022   Pacemaker Abbott device BiV 09/06/2022   Malignant neoplasm of upper-outer quadrant of left breast in female, estrogen receptor positive (HCC) 08/06/2022   Pure hypercholesterolemia 05/15/2022   Skin cancer 03/07/2022   Closed nondisplaced fracture of distal phalanx of right thumb 02/15/2022   Cancer (HCC) 05/25/2021   Left bundle branch block 04/12/2021   Dilated cardiomyopathy (HCC) 04/12/2021   Vasculitis (HCC) 02/22/2021   Anxiety state 02/12/2021   Body fluid retention 02/12/2021   Cervical intraepithelial neoplasia 02/12/2021   Insomnia 02/12/2021   Pruritus of vulva 02/12/2021   Leg wound, right 10/03/2020  Bilateral primary osteoarthritis of knee 03/30/2019   Paroxysmal atrial fibrillation (HCC) 04/07/2015   Chronic anticoagulation 04/07/2015   Paroxysmal atrial flutter (HCC) 03/16/2015   Essential hypertension, benign 02/24/2014   Sciatica 12/08/2013   Hyperlipidemia 09/25/2013   Osteopenia 11/19/2012   Low back pain 01/26/2010   Carcinoma in situ of breast 09/04/2009   DEPRESSION, PROLONGED 09/04/2009   OA (osteoarthritis) of knee 09/04/2009   PERIPHERAL EDEMA 09/04/2009   History of colonic polyps 09/04/2009    PCP: Copland, Harlene BROCKS,  MD   REFERRING PROVIDER: Watt Harlene BROCKS, MD   REFERRING DIAG: M25.511 (ICD-10-CM) - Acute pain of right shoulder   THERAPY DIAG:  Acute pain of right shoulder due to trauma  Muscle weakness (generalized)  Chronic left-sided thoracic back pain  Abnormal posture  Difficulty in walking, not elsewhere classified  RATIONALE FOR EVALUATION AND TREATMENT: Rehabilitation  ONSET DATE: 11/10/23  NEXT MD VISIT:  f/u PRN   SUBJECTIVE:                                                                                                                                                                                                         SUBJECTIVE STATEMENT: 7/30:  Patient reports feels about the same and is frustrated that her back and shoulder pain are not improving.  States her shoulder feels the same.  States she is doing her HEP at home.    Patient is an 88 y/o female referred to PT for R shoulder pain and LBP following a fall 11/10/23 on vacation in the hotel.   States fell coming out of bathroom with floor transition from floor to carpet.  States hit R shoulder against the wall on the way down.  R shoulder pain resulted from the fall.   Patient reports significant bruising that has now resolved.    R shoulder xrays showed no fx's.  She has been using Salonpas with some relief.   LBP is actually more L sided thoracic in nature, and not a new issue.   The pain is intermittent and worse with making her bed in forward flexed reaching position and with vacuuming.    PAIN: Are you having pain? Yes: NPRS scale: R shoulder 0/10 resting, 2-3/10 with certain movements;   L thoracic spine:  0/10 resting; 5-6/10 worst with activities Pain location: R shoulder Pain description: subtle pinching pain Aggravating factors: elevating the R shoulder into full elevation Relieving factors: not elevating or lifting the R arm  PERTINENT HISTORY:  recurrent L breast CA with node dissection 4/24, dilated  cardiomyopathy, Afib, pacemaker, HTN, anxiety, LBP, sciatica, anxiety  PRECAUTIONS: ICD/Pacemaker, Lymphedema precautions LUE due to breast CA  RED FLAGS: None  HAND DOMINANCE: Right  WEIGHT BEARING RESTRICTIONS: No  FALLS:  Has patient fallen in last 6 months? Yes. Number of falls 1  LIVING ENVIRONMENT: Lives with: lives with their daughter Lives in: House/apartment Stairs: Yes: Internal: 12 steps; on right going up and External: 4 steps; on right going up Has following equipment at home: Single point cane and Walker - 2 wheeled  OCCUPATION: retired from [employer], corporate work  PLOF: Independent with community mobility with device  PATIENT GOALS: get my shoulder pain resolved, and walk better on the R knee (OA)   OBJECTIVE: (objective measures completed at initial evaluation unless otherwise dated)  DIAGNOSTIC FINDINGS:  CLINICAL DATA:  Right shoulder pain after fall 5 days ago.   EXAM: RIGHT SHOULDER - 2+ VIEW   COMPARISON:  None Available.   FINDINGS: There is no evidence of fracture or dislocation. There is no evidence of arthropathy or other focal bone abnormality. Soft tissues are unremarkable.   IMPRESSION: Negative.     Electronically Signed   By: Lynwood Landy Raddle M.D.   On: 11/15/2023 15:45  PATIENT SURVEYS:  Quick Dash   COGNITION: Overall cognitive status: Within functional limits for tasks assessed     SENSATION: WFL  POSTURE: rounded shoulders, forward head, and R scoliosis  UPPER EXTREMITY ROM:   Active ROM Right eval Left eval  Shoulder flexion 160 165  Shoulder extension 70 70  Shoulder abduction 150 165  Shoulder adduction    Shoulder internal rotation 100 100  Shoulder external rotation 100 110  Elbow flexion    Elbow extension    Wrist flexion    Wrist extension    Wrist ulnar deviation    Wrist radial deviation    Wrist pronation    Wrist supination    (Blank rows = not tested)  UPPER EXTREMITY MMT:  MMT Right eval Left  eval  Shoulder flexion 4- p! 5  Shoulder extension 4- 4  Shoulder abduction 4- p! 5  Shoulder adduction    Shoulder internal rotation 4 5  Shoulder external rotation 3- p! 5  Middle trapezius    Lower trapezius    Elbow flexion    Elbow extension    Wrist flexion    Wrist extension    Wrist ulnar deviation    Wrist radial deviation    Wrist pronation    Wrist supination    Grip strength (lbs)    (Blank rows = not tested) LUMBAR ROM:   Active  A/PROM  eval  Flexion 90% ROM  Extension 90% ROM  Right lateral flexion To knee jt  Left lateral flexion Above knee jt  Right rotation 90%  Left rotation 75%   (Blank rows = not tested)   SHOULDER SPECIAL TESTS: Impingement tests: Neer impingement test: positive , Hawkins/Kennedy impingement test: negative, and Painful arc test: negative SLAP lesions: Biceps load test: positive  Instability tests: Apprehension test: negative Rotator cuff assessment: Drop arm test: negative, Empty can test: positive , Full can test: positive , and Gerber lift off test: negative Biceps assessment: Speed's test: positive   LUMBAR SPECIAL TESTS:  Straight leg raise test: Negative   SLR RLE is 70 deg, LLE is 80 deg  JOINT MOBILITY TESTING:  hypermobile Bilateral shoulder joint accessory motions  PALPATION:  TTP over the R greater tuberosity, nontender over coracoid and posterior R shoulder;  TTP over L T-10 and T11 facets and paraspinals    TODAY'S TREATMENT:  01/08/24 THERAPEUTIC EXERCISE: To improve strength.  Demonstration, verbal and tactile cues throughout for technique. UBE L0 3'F/3'B  THERAPEUTIC ACTIVITIES: To improve functional performance.  Demonstration, verbal and tactile cues throughout for technique. Standing rowing GTB 2/20 RUE Shoulder ext RTB x 20 BUE Pendulums CW/CCW x 20 ea RUE Seated pulldowns blue TB arm straight x 20; elbow bent x 20 RUE Standing serratus punch/chest press YTB x 10 pain/difficult; no resistance x  20 RUE Wall push ups x 20 BUE Seated shoulder flexion x 8; AA flexion w/ eccentric lowering x 2/5 RUE  Ionto Patch (#1 of 6) to R greater tuberosity - Dexamethasone  1 mL, 80 mA-min, 4-6 hr wear time    01/01/24 THERAPEUTIC EXERCISE: To improve strength.  Demonstration, verbal and tactile cues throughout for technique. UBE L0 3'F/3'B  THERAPEUTIC ACTIVITIES: To improve functional performance.  Demonstration, verbal and tactile cues throughout for technique. Supine LTR x 20 B Bridge x 10 B Manual SLR hamstretch x 1' x 2 KTOS piriformis stretch x 1' x 2 LLE Standing hip abd x 2/10 LLE; hip ext x 2/10 LLE Standing counter back extension x 2/10 BLE  NEUROMUSCULAR RE-EDUCATION: To improve posture. Seated green swiss ball roll outs to R x 10, Forward x 10  Gaenslen's test stretch with LLE off table and R SKTC x 1' with some MET for posterior L inominate  MANUAL THERAPY: To promote reduced pain utilizing therapeutic massage. Theragun to L low back from L1-5 transverse processes laterally to quadratus and distal to PSIS Pelvic shotgun x 2 without any pop LLE long leg manual traction with grade 3-4 pulls x 2/10  12/17/23 UBE L1 x 3'F/3'B  Supine cross over piriformis stretch x 1' x 3 RLE Supine FABER piriformis stretch x 1' x 2 RLE Supine ER x 20 RUE Supine circles at 90 CW AND CCW  x 20 ea RUE Supine press up x 20 RUE Wand shoulder flexion 2/10 BUE SL ER x 20 RUE with PT assist on concentric phase and pt. Performing eccentric with slight PT assist SL HABD x 20 RUE Seated ER at 90/90 x 20 RUE  Standing rowing GTB x 20 RUE Standing shoulder ext RTB x 20 RUE  MANUAL THERAPY: To promote reduced pain utilizing manual TP therapy. MFR x 10' to L UT Tp  R UT stretch with manual PT assist and overpressure x 1' x 2    12/17/23 UBE x 5' F/B HEP review: SL HABD Supine circles at 90 CW/CCW Supine active ER Seated active ER Seated ER at 90 deg abd  SL ER attempted but unable Standing  row GTB x 20 RUE Standing shoulder ext RTB x 20 RUE Wall dusting up/down x 2/10 RUE Wall dusting CCW x 10 ea RUE  MANUAL THERAPY: To promote normalized muscle tension, improved flexibility, and reduced pain utilizing manual TP therapy. Seated intermittent R sidebending stretching with arm reaching overhead with some R trunk rotation with maintained deep pressure to R thoracic paraspinal Tp during stretch and with relaxation phase  12/11/23 SELF CARE: Provided education lifting with BUE to decrease pain, full can position of R shoulder with lifting.    PATIENT EDUCATION:  Education details: PT eval findings, anticipated POC, initial HEP, postural awareness, and posture and body mechanics for typical daily postioning, mobility and household tasks  Person educated: Patient Education method: Explanation, Demonstration, Verbal cues, Tactile cues, and Handouts Education comprehension: verbalized  understanding, verbal cues required, tactile cues required, and needs further education  HOME EXERCISE PROGRAM: Access Code: [email or web address] URL: [email or web address] Date: 12/11/2023 Prepared by: Garnette Montclair  Exercises - Sidelying Shoulder Horizontal Abduction  - 1 x daily - 7 x weekly - 1 sets - 20 reps - Supine Shoulder Circles  - 1 x daily - 7 x weekly - 1 sets - 20 reps - Seated Shoulder External Rotation AAROM with Dowel  - 1 x daily - 7 x weekly - 1 sets - 20 reps - Seated Single Arm Shoulder External Rotation with Towel  - 1 x daily - 7 x weekly - 1 sets - 20 reps - Seated Shoulder External Rotation AROM in Supported Abduction  - 1 x daily - 7 x weekly - 1 sets - 20 reps   ASSESSMENT:  CLINICAL IMPRESSION: 7/30 Focus of today's treatment is on R shoulder pain and weakness.   Patient reports any lifting or carrying, such as a coffee cup increased RUE pain.  States pain is more in the mid humerus, but this is likely referred shoulder pain.   Patient still has profound R  shoulder external rotation weakness in neutral.  She feels her pain is still the same and is frustrated at lack of progress.   She is recommended to contact her orthopedist and see if she can get an appointment.  Focus of treatment today is on her R shoulder and trying to advance her HEP as little at a time, but pain is a limitation.   She needs to do more strengthening in elevated positions.  Added ionto to R shoulder to see if this would help her pain  Sheila Schmidt is a 88 y.o. female who was referred to physical therapy for evaluation and treatment for R shoulder pain and L lower thoracic pain.    Patient reports onset of R shoulder pain beginning approximately 1 month ago s/p fall. Pain is worse with reaching overhead, lifting any weight with the RUE past shoulder level, and haircare.  Patient has deficits in lumbar flexibility, R shoulder strength, abnormal posture, and TTP with abnormal muscle tension R shoulder and L thoracic spine/BLE which are interfering with ADLs and are impacting quality of life.  On QuickDASH patient scored 20.5/100 demonstrating 20.5% disability.  Meyer will benefit from skilled PT to address above deficits to improve mobility and activity tolerance with decreased pain interference.   OBJECTIVE IMPAIRMENTS: Abnormal gait, decreased balance, decreased strength, and postural dysfunction.   ACTIVITY LIMITATIONS: lifting, stairs, reach over head, and locomotion level  PARTICIPATION LIMITATIONS: cleaning, laundry, and making bed, vacuuming  PERSONAL FACTORS: Age and 1-2 comorbidities: recurrent L breast CA with node dissection 4/24, dilated cardiomyopathy, Afib, pacemaker, HTN, anxiety, LBP, anxiety are also affecting patient's functional outcome.   REHAB POTENTIAL: Good  CLINICAL DECISION MAKING: Evolving/moderate complexity  EVALUATION COMPLEXITY: Moderate   GOALS: Goals reviewed with patient? Yes  SHORT TERM GOALS: Target date: 01/08/2024  Patient will be  independent with initial HEP to improve outcomes and carryover.  Baseline: 100% PT assistance required for correct completion Goal status: MET 01/01/24  2.  Patient will report 25% improvement in R shoulder pain and L thoracic pain to improve QOL.   Baseline: R shoulder 2-3/10; L thoracic spine 5-6/10 01/08/24:  R shoulder 5/10; L mid to lower back 7/10 Goal status: IN PROGRESS   LONG TERM GOALS: Target date: 02/05/2024  Patient will be independent with ongoing/advanced HEP for self-management at home.  Baseline: advancing  HEP 01/01/24 Goal status: IN PROGRESS  2.  Patient will report 50-75% improvement in R shoulder and L thoracic pain to improve QOL.  Baseline: R shoulder 2-3/10; L thoracic 5-6/10 01/08/24:  R shoulder 5/10; L mid to lower back 7/10 Goal status: IN PROGRESS  3.  Patient to demonstrate improved upright posture with posterior shoulder girdle engaged to promote improved glenohumeral joint mobility. Baseline: forward head/rounded shoulders Goal status: INITIAL  4.  Patient to improve L thoracic sidebending and rotation to equal bilaterally/90% without pain provocation to allow for increased ease of ADLs.  Baseline: Refer to above lumbar ROM table Goal status: INITIAL  5.  Patient will demonstrate improved R shoulder strength to >/= 4+/5 for functional UE use. Baseline: Refer to above UE MMT table Goal status: INITIAL  6  Patient will report </= 5%% on QuickDASH (MCID = 14%) to demonstrate improved functional ability.  Baseline: 20.5 Goal status: INITIAL  7.  Patient will report no falls and improve on functional strength/balance testing (TBD next visit if able) to WNL for agegroup to reduce fall risk   Baseline: TBD  Goal status: INITIAL   PLAN:  PT FREQUENCY: 1x/week  PT DURATION: 8 weeks  PLANNED INTERVENTIONS: 97164- PT Re-evaluation, 97750- Physical Performance Testing, 97110-Therapeutic exercises, 97530- Therapeutic activity, 97112- Neuromuscular  re-education, 97535- Self Care, 02859- Manual therapy, [id number]- Gait training, [phone]- Electrical stimulation (unattended), [id number]- Ultrasound, 02987- Traction (mechanical), [id number]- Ionotophoresis 4mg /ml Dexamethasone , 79439 (1-2 muscles), [id number] (3+ muscles)- Dry Needling, Patient/Family education, Balance training, Stair training, Taping, Joint mobilization, Spinal mobilization, Cryotherapy, Moist heat, and Biofeedback  PLAN FOR NEXT SESSION: Assess ionto; Continue to work on R shoulder elevation strength, add scapular wall clocks, try KT taping R shoulder  Makayah Pauli, PT 01/08/2024, 8:34 PM  Date of referral: 11/20/23 [place] provider: Watt Harlene BROCKS, MD/ Referring diagnosis? M25.511 (ICD-10-CM) - Acute pain of right shoulder  Treatment diagnosis? (if different than referring diagnosis)  Acute pain of right shoulder due to trauma  Muscle weakness (generalized)  Chronic left-sided thoracic back pain  Abnormal posture  Difficulty in walking, not elsewhere classified  What was this (referring dx) caused by? Felton Hawks of Condition: Initial Onset (within last 3 months)   Laterality: Rt  Current Functional Measure Score: DASH 20.5  Objective measurements identify impairments when they are compared to normal values, the uninvolved extremity, and prior level of function.  [x]  Yes  []  No  Objective assessment of functional ability: Moderate functional limitations   Briefly describe symptoms: R shoulder pain s/p fall with 3-4-/5 strength in the R shoulder, inability to lift any significant weight overhead with R arm, difficulty doing housework, Engineer, manufacturing systems, laundry, any reaching overhead  How did symptoms start: fall 11/10/23  Average pain intensity:  Last 24 hours: 2-3  Past week: 2-3  How often does the pt experience symptoms? Frequently  How much have the symptoms interfered with usual daily activities? Quite a bit  How has condition changed since care began at this  facility? Better  In general, how is the patients overall health? Fair  Onset date: 11/10/23   BACK PAIN (STarT Back Screening Tool) - (When applicable):  Has your back pain spread down your leg(s) at sometime in the last 2 weeks? []  Yes   [x]  No Have you had pain in the shoulder or neck at sometime in the past 2 weeks? [x]  Yes   []  No Have you only walked short distances because of your back pain? []  Yes   [  x] No In the past 2 weeks, have you dressed more slowly than usual because of your back pain? []  Yes   [x]  No Do you think it is not really safe for person with a condition like yours to be physically active? [x]  Yes   []  No Have worrying thoughts been going through your mind a lot of the time? []  Yes   [x]  No Do you feel that your back pain is terrible and it is never going to get any better? []  Yes   [x]  No In general, have you stopped enjoying all the things you usually enjoy? []  Yes   [x]  No Overall, how bothersome has your back pain been in the last 2 weeks? []  Not at all   []  Slightly     [x]  Moderate   []  Very much     []  Extremely

## 2024-01-15 ENCOUNTER — Ambulatory Visit: Admitting: Rehabilitation

## 2024-01-22 ENCOUNTER — Ambulatory Visit: Attending: Family Medicine | Admitting: Rehabilitation

## 2024-01-22 DIAGNOSIS — M6281 Muscle weakness (generalized): Secondary | ICD-10-CM | POA: Insufficient documentation

## 2024-01-22 DIAGNOSIS — R293 Abnormal posture: Secondary | ICD-10-CM | POA: Insufficient documentation

## 2024-01-22 DIAGNOSIS — G8929 Other chronic pain: Secondary | ICD-10-CM | POA: Diagnosis present

## 2024-01-22 DIAGNOSIS — M546 Pain in thoracic spine: Secondary | ICD-10-CM | POA: Diagnosis present

## 2024-01-22 DIAGNOSIS — M25511 Pain in right shoulder: Secondary | ICD-10-CM | POA: Insufficient documentation

## 2024-01-22 DIAGNOSIS — R262 Difficulty in walking, not elsewhere classified: Secondary | ICD-10-CM | POA: Diagnosis present

## 2024-01-22 DIAGNOSIS — G8911 Acute pain due to trauma: Secondary | ICD-10-CM | POA: Insufficient documentation

## 2024-01-22 NOTE — Therapy (Signed)
 OUTPATIENT PHYSICAL THERAPY SHOULDER TREATMENT   Patient Name: Sheila Schmidt MRN: [id number] DOB:[birth year], 88 y.o., female Today's Date: 01/22/2024   END OF SESSION:  PT End of Session - 01/22/24 1153     Visit Number 6    Date for PT Re-Evaluation 02/05/24    Authorization Type [employer] MCR    PT Start Time 1147    PT Stop Time 1235    PT Time Calculation (min) 48 min    Activity Tolerance Patient tolerated treatment well    Behavior During Therapy WFL for tasks assessed/performed            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   Dysrhythmia    Afib   Heart murmur    Hx of adenomatous colonic polyps    Hyperlipidemia    Presence of permanent cardiac pacemaker    Past Surgical History:  Procedure Laterality Date   ATRIAL FLUTTER ABLATION     BIV PACEMAKER INSERTION CRT-P N/A 12/04/2021   Procedure: BIV PACEMAKER INSERTION CRT-P;  Surgeon: Cindie Ole DASEN, MD;  Location: [facility];  Service: Cardiovascular;  Laterality: N/A;   BREAST BIOPSY Left 07/30/2022   US  LT BREAST BX W LOC DEV 1ST LESION IMG BX SPEC US  GUIDE 07/30/2022 [facility]   BREAST BIOPSY Left 09/10/2022   US  LT RADIOACTIVE SEED LOC 09/10/2022 [facility]   BREAST BIOPSY Left 09/10/2022   US  LT RADIOACTIVE SEED LOC 09/10/2022 [facility]   BREAST EXCISIONAL BIOPSY Left 2010   benign   BREAST LUMPECTOMY WITH RADIOACTIVE SEED AND SENTINEL LYMPH NODE BIOPSY Left 09/11/2022   Procedure: LEFT BREAST LUMPECTOMY WITH RADIOACTIVE SEED;  Surgeon: Vanderbilt Ned, MD;  Location: [facility] OR;  Service: General;  Laterality: Left;   CARDIOVERSION  05/2020   in [facility]   CARDIOVERSION N/A 06/21/2021   Procedure: CARDIOVERSION;  Surgeon: Barbaraann Darryle Ned, MD;  Location: [facility] ENDOSCOPY;  Service: Cardiovascular;  Laterality: N/A;   CARDIOVERSION N/A  03/26/2022   Procedure: CARDIOVERSION;  Surgeon: Pietro Redell RAMAN, MD;  Location: [facility] ENDOSCOPY;  Service: Cardiovascular;  Laterality: N/A;   CHOLECYSTECTOMY     CHOLECYSTECTOMY, LAPAROSCOPIC  '06   [name]   KNEE ARTHROSCOPY     Left '00 [place]/ Right '08   lumpectomy- remote     benign   RADIOACTIVE SEED GUIDED AXILLARY SENTINEL LYMPH NODE Left 09/11/2022   Procedure: RADIOACTIVE SEED GUIDED LEFT SENTINEL LYMPH NODE BIOPSY;  Surgeon: Vanderbilt Ned, MD;  Location: [facility] OR;  Service: General;  Laterality: Left;   TOOTH EXTRACTION     TOTAL KNEE ARTHROPLASTY  02/05/2011   left   Patient Active Problem List   Diagnosis Date Noted   Acute respiratory failure with hypoxia (HCC) 07/03/2023   Acute hypoxic respiratory failure (HCC) 07/03/2023   Hypertensive disorder 01/15/2023   Dyslipidemia 01/15/2023   Genetic testing 09/18/2022   Pacemaker Abbott device BiV 09/06/2022   Malignant neoplasm of upper-outer quadrant of left breast in female, estrogen receptor positive (HCC) 08/06/2022   Pure hypercholesterolemia 05/15/2022   Skin cancer 03/07/2022   Closed nondisplaced fracture of distal phalanx of right thumb 02/15/2022   Cancer (HCC) 05/25/2021   Left bundle branch block 04/12/2021   Dilated cardiomyopathy (HCC) 04/12/2021   Vasculitis (HCC) 02/22/2021   Anxiety state  02/12/2021   Body fluid retention 02/12/2021   Cervical intraepithelial neoplasia 02/12/2021   Insomnia 02/12/2021   Pruritus of vulva 02/12/2021   Leg wound, right 10/03/2020   Bilateral primary osteoarthritis of knee 03/30/2019   Paroxysmal atrial fibrillation (HCC) 04/07/2015   Chronic anticoagulation 04/07/2015   Paroxysmal atrial flutter (HCC) 03/16/2015   Essential hypertension, benign 02/24/2014   Sciatica 12/08/2013   Hyperlipidemia 09/25/2013   Osteopenia 11/19/2012   Low back pain 01/26/2010   Carcinoma in situ of breast 09/04/2009   DEPRESSION, PROLONGED 09/04/2009   OA (osteoarthritis) of knee  09/04/2009   PERIPHERAL EDEMA 09/04/2009   History of colonic polyps 09/04/2009    PCP: Watt Harlene BROCKS, MD   REFERRING PROVIDER: Watt Harlene BROCKS, MD   REFERRING DIAG: M25.511 (ICD-10-CM) - Acute pain of right shoulder   THERAPY DIAG:  Acute pain of right shoulder due to trauma  Muscle weakness (generalized)  Chronic left-sided thoracic back pain  Abnormal posture  Difficulty in walking, not elsewhere classified  RATIONALE FOR EVALUATION AND TREATMENT: Rehabilitation  ONSET DATE: 11/10/23  NEXT MD VISIT:  f/u PRN   SUBJECTIVE:                                                                                                                                                                                                         SUBJECTIVE STATEMENT: 8/13:  States feeling fair today.  Was unable to come for last visit due to severe rain/storms.   States pain today is 4-5/10 in the L low back and also the R shoulder.     Patient is an 88 y/o female referred to PT for R shoulder pain and LBP following a fall 11/10/23 on vacation in the hotel.   States fell coming out of bathroom with floor transition from floor to carpet.  States hit R shoulder against the wall on the way down.  R shoulder pain resulted from the fall.   Patient reports significant bruising that has now resolved.    R shoulder xrays showed no fx's.  She has been using Salonpas with some relief.   LBP is actually more L sided thoracic in nature, and not a new issue.   The pain is intermittent and worse with making her bed in forward flexed reaching position and with vacuuming.    PAIN: Are you having pain? Yes: NPRS scale: R shoulder 0/10 resting, 2-3/10 with certain movements;   L thoracic spine:  0/10 resting; 5-6/10 worst with activities Pain location: R shoulder Pain description: subtle pinching pain Aggravating  factors: elevating the R shoulder into full elevation Relieving factors: not elevating or lifting  the R arm  PERTINENT HISTORY:  recurrent L breast CA with node dissection 4/24, dilated cardiomyopathy, Afib, pacemaker, HTN, anxiety, LBP, sciatica, anxiety  PRECAUTIONS: ICD/Pacemaker, Lymphedema precautions LUE due to breast CA  RED FLAGS: None  HAND DOMINANCE: Right  WEIGHT BEARING RESTRICTIONS: No  FALLS:  Has patient fallen in last 6 months? Yes. Number of falls 1  LIVING ENVIRONMENT: Lives with: lives with their daughter Lives in: House/apartment Stairs: Yes: Internal: 12 steps; on right going up and External: 4 steps; on right going up Has following equipment at home: Single point cane and Walker - 2 wheeled  OCCUPATION: retired from [employer] banker, corporate work  PLOF: Independent with community mobility with device  PATIENT GOALS: get my shoulder pain resolved, and walk better on the R knee (OA)   OBJECTIVE: (objective measures completed at initial evaluation unless otherwise dated)  DIAGNOSTIC FINDINGS:  CLINICAL DATA:  Right shoulder pain after fall 5 days ago.   EXAM: RIGHT SHOULDER - 2+ VIEW   COMPARISON:  None Available.   FINDINGS: There is no evidence of fracture or dislocation. There is no evidence of arthropathy or other focal bone abnormality. Soft tissues are unremarkable.   IMPRESSION: Negative.     Electronically Signed   By: Lynwood Landy Raddle M.D.   On: 11/15/2023 15:45  PATIENT SURVEYS:  Quick Dash   COGNITION: Overall cognitive status: Within functional limits for tasks assessed     SENSATION: WFL  POSTURE: rounded shoulders, forward head, and R scoliosis  UPPER EXTREMITY ROM:   Active ROM Right eval Left eval  Shoulder flexion 160 165  Shoulder extension 70 70  Shoulder abduction 150 165  Shoulder adduction    Shoulder internal rotation 100 100  Shoulder external rotation 100 110  Elbow flexion    Elbow extension    Wrist flexion    Wrist extension    Wrist ulnar deviation    Wrist radial deviation    Wrist pronation     Wrist supination    (Blank rows = not tested)  UPPER EXTREMITY MMT:  MMT Right eval Left eval  Shoulder flexion 4- p! 5  Shoulder extension 4- 4  Shoulder abduction 4- p! 5  Shoulder adduction    Shoulder internal rotation 4 5  Shoulder external rotation 3- p! 5  Middle trapezius    Lower trapezius    Elbow flexion    Elbow extension    Wrist flexion    Wrist extension    Wrist ulnar deviation    Wrist radial deviation    Wrist pronation    Wrist supination    Grip strength (lbs)    (Blank rows = not tested) LUMBAR ROM:   Active  A/PROM  eval  Flexion 90% ROM  Extension 90% ROM  Right lateral flexion To knee jt  Left lateral flexion Above knee jt  Right rotation 90%  Left rotation 75%   (Blank rows = not tested)   SHOULDER SPECIAL TESTS: Impingement tests: Neer impingement test: positive , Hawkins/Kennedy impingement test: negative, and Painful arc test: negative SLAP lesions: Biceps load test: positive  Instability tests: Apprehension test: negative Rotator cuff assessment: Drop arm test: negative, Empty can test: positive , Full can test: positive , and Gerber lift off test: negative Biceps assessment: Speed's test: positive   LUMBAR SPECIAL TESTS:  Straight leg raise test: Negative   SLR RLE is 70 deg, LLE  is 80 deg  JOINT MOBILITY TESTING:  hypermobile Bilateral shoulder joint accessory motions  PALPATION:  TTP over the R greater tuberosity, nontender over coracoid and posterior R shoulder;     TTP over L T-10 and T11 facets and paraspinals    TODAY'S TREATMENT:  01/22/24 THERAPEUTIC EXERCISE: To improve strength.  Demonstration, verbal and tactile cues throughout for technique. Nu-step L3 x 6' BUE/BLE  THERAPEUTIC ACTIVITIES: To improve functional performance.  Demonstration, verbal and tactile cues throughout for technique. Seated ball flexion BUE x 2/10 BUE SL ER eccentrics x 3/10 RUE SL abd x 3/10 RUE SL HABD 1# x 3/10 RUE SL circles at 90  CW/CCW x 20 ea RUE Seated elbow prop R sidebending stretch for Lspine reaching LUE overhead x 10 sec x 6 reps Seated R sidebending quadratus stretch holding door frame BUE  Standing R pelvic sideglides x 20 at counter Standing counter support RUE with LUE raised/hand on cabinet with L hip extension x 20   MANUAL THERAPY: To promote reduced pain utilizing therapeutic massage. STM/MFR to L quadratus, L paraspinals, L lower latissimus  Ionto Patch (#2 of 6) to R greater tuberosity - Dexamethasone  1 mL, 80 mA-min, 4-6 hr wear time     01/08/24 THERAPEUTIC EXERCISE: To improve strength.  Demonstration, verbal and tactile cues throughout for technique. UBE L0 3'F/3'B  THERAPEUTIC ACTIVITIES: To improve functional performance.  Demonstration, verbal and tactile cues throughout for technique. Standing rowing GTB 2/20 RUE Shoulder ext RTB x 20 BUE Pendulums CW/CCW x 20 ea RUE Seated pulldowns blue TB arm straight x 20; elbow bent x 20 RUE Standing serratus punch/chest press YTB x 10 pain/difficult; no resistance x 20 RUE Wall push ups x 20 BUE Seated shoulder flexion x 8; AA flexion w/ eccentric lowering x 2/5 RUE  Ionto Patch (#1 of 6) to R greater tuberosity - Dexamethasone  1 mL, 80 mA-min, 4-6 hr wear time    01/01/24 THERAPEUTIC EXERCISE: To improve strength.  Demonstration, verbal and tactile cues throughout for technique. UBE L0 3'F/3'B  THERAPEUTIC ACTIVITIES: To improve functional performance.  Demonstration, verbal and tactile cues throughout for technique. Supine LTR x 20 B Bridge x 10 B Manual SLR hamstretch x 1' x 2 KTOS piriformis stretch x 1' x 2 LLE Standing hip abd x 2/10 LLE; hip ext x 2/10 LLE Standing counter back extension x 2/10 BLE  NEUROMUSCULAR RE-EDUCATION: To improve posture. Seated green swiss ball roll outs to R x 10, Forward x 10  Gaenslen's test stretch with LLE off table and R SKTC x 1' with some MET for posterior L inominate  MANUAL THERAPY: To promote  reduced pain utilizing therapeutic massage. Theragun to L low back from L1-5 transverse processes laterally to quadratus and distal to PSIS Pelvic shotgun x 2 without any pop LLE long leg manual traction with grade 3-4 pulls x 2/10  12/17/23 UBE L1 x 3'F/3'B  Supine cross over piriformis stretch x 1' x 3 RLE Supine FABER piriformis stretch x 1' x 2 RLE Supine ER x 20 RUE Supine circles at 90 CW AND CCW  x 20 ea RUE Supine press up x 20 RUE Wand shoulder flexion 2/10 BUE SL ER x 20 RUE with PT assist on concentric phase and pt. Performing eccentric with slight PT assist SL HABD x 20 RUE Seated ER at 90/90 x 20 RUE  Standing rowing GTB x 20 RUE Standing shoulder ext RTB x 20 RUE  MANUAL THERAPY: To promote reduced pain utilizing manual  TP therapy. MFR x 10' to L UT Tp  R UT stretch with manual PT assist and overpressure x 1' x 2    12/17/23 UBE x 5' F/B HEP review: SL HABD Supine circles at 90 CW/CCW Supine active ER Seated active ER Seated ER at 90 deg abd  SL ER attempted but unable Standing row GTB x 20 RUE Standing shoulder ext RTB x 20 RUE Wall dusting up/down x 2/10 RUE Wall dusting CCW x 10 ea RUE  MANUAL THERAPY: To promote normalized muscle tension, improved flexibility, and reduced pain utilizing manual TP therapy. Seated intermittent R sidebending stretching with arm reaching overhead with some R trunk rotation with maintained deep pressure to R thoracic paraspinal Tp during stretch and with relaxation phase  12/11/23 SELF CARE: Provided education lifting with BUE to decrease pain, full can position of R shoulder with lifting.    PATIENT EDUCATION:  Education details: PT eval findings, anticipated POC, initial HEP, postural awareness, and posture and body mechanics for typical daily postioning, mobility and household tasks  Person educated: Patient Education method: Explanation, Demonstration, Verbal cues, Tactile cues, and Handouts Education comprehension:  verbalized understanding, verbal cues required, tactile cues required, and needs further education  HOME EXERCISE PROGRAM: Access Code: [email or web address] URL: [email or web address] Date: 12/11/2023 Prepared by: Garnette Montclair  Exercises - Sidelying Shoulder Horizontal Abduction  - 1 x daily - 7 x weekly - 1 sets - 20 reps - Supine Shoulder Circles  - 1 x daily - 7 x weekly - 1 sets - 20 reps - Seated Shoulder External Rotation AAROM with Dowel  - 1 x daily - 7 x weekly - 1 sets - 20 reps - Seated Single Arm Shoulder External Rotation with Towel  - 1 x daily - 7 x weekly - 1 sets - 20 reps - Seated Shoulder External Rotation AROM in Supported Abduction  - 1 x daily - 7 x weekly - 1 sets - 20 reps   ASSESSMENT:  CLINICAL IMPRESSION: 8/13:  Patient got some relief with ionto to R shoulder, so this is repeated today.   Will plan to continue for 6 total treatments to R shoulder.  She is not as painful as she has been the last 2 visits.  She is able to lift the RUE antigravity, but still has painful arc from 90-110.  R shoulder external rotation continues to be weak and added eccentric lowering today with PT assist for concentric phase.  Progress is going to be slower since we are only seeing patient once weekly (due to high copay) and treating 2 different areas (R shoulder, and L lower thoracic/upper lumbar spine pain).     Sheila Schmidt is a 88 y.o. female who was referred to physical therapy for evaluation and treatment for R shoulder pain and L lower thoracic pain.    Patient reports onset of R shoulder pain beginning approximately 1 month ago s/p fall. Pain is worse with reaching overhead, lifting any weight with the RUE past shoulder level, and haircare.  Patient has deficits in lumbar flexibility, R shoulder strength, abnormal posture, and TTP with abnormal muscle tension R shoulder and L thoracic spine/BLE which are interfering with ADLs and are impacting quality of life.  On  QuickDASH patient scored 20.5/100 demonstrating 20.5% disability.  [place] will benefit from skilled PT to address above deficits to improve mobility and activity tolerance with decreased pain interference.   OBJECTIVE IMPAIRMENTS: Abnormal gait, decreased balance, decreased strength, and postural dysfunction.  ACTIVITY LIMITATIONS: lifting, stairs, reach over head, and locomotion level  PARTICIPATION LIMITATIONS: cleaning, laundry, and making bed, vacuuming  PERSONAL FACTORS: Age and 1-2 comorbidities: recurrent L breast CA with node dissection 4/24, dilated cardiomyopathy, Afib, pacemaker, HTN, anxiety, LBP, anxiety are also affecting patient's functional outcome.   REHAB POTENTIAL: Good  CLINICAL DECISION MAKING: Evolving/moderate complexity  EVALUATION COMPLEXITY: Moderate   GOALS: Goals reviewed with patient? Yes  SHORT TERM GOALS: Target date: 01/08/2024  Patient will be independent with initial HEP to improve outcomes and carryover.  Baseline: 100% PT assistance required for correct completion Goal status: MET 01/01/24  2.  Patient will report 25% improvement in R shoulder pain and L thoracic pain to improve QOL.   Baseline: R shoulder 2-3/10; L thoracic spine 5-6/10 01/08/24:  R shoulder 5/10; L mid to lower back 7/10 01/22/24:  4-5/10 R shoulder and 4-5/10 L lateral T/Lspine Goal status: IN PROGRESS   LONG TERM GOALS: Target date: 02/05/2024  Patient will be independent with ongoing/advanced HEP for self-management at home.  Baseline: advancing HEP 01/01/24 Goal status: IN PROGRESS  2.  Patient will report 50-75% improvement in R shoulder and L thoracic pain to improve QOL.  Baseline: R shoulder 2-3/10; L thoracic 5-6/10 01/08/24:  R shoulder 5/10; L mid to lower back 7/10 01/22/24:  4-5/10 R shoulder and 4-5/10 L lateral T/Lspine Goal status: IN PROGRESS  3.  Patient to demonstrate improved upright posture with posterior shoulder girdle engaged to promote improved  glenohumeral joint mobility. Baseline: forward head/rounded shoulders Goal status: INITIAL  4.  Patient to improve L thoracic sidebending and rotation to equal bilaterally/90% without pain provocation to allow for increased ease of ADLs.  Baseline: Refer to above lumbar ROM table Goal status: INITIAL  5.  Patient will demonstrate improved R shoulder strength to >/= 4+/5 for functional UE use. Baseline: Refer to above UE MMT table Goal status: INITIAL  6  Patient will report </= 5%% on QuickDASH (MCID = 14%) to demonstrate improved functional ability.  Baseline: 20.5 Goal status: INITIAL  7.  Patient will report no falls and improve on functional strength/balance testing (TBD next visit if able) to WNL for agegroup to reduce fall risk   Baseline: TBD  Goal status: INITIAL   PLAN:  PT FREQUENCY: 1x/week  PT DURATION: 8 weeks  PLANNED INTERVENTIONS: 97164- PT Re-evaluation, 97750- Physical Performance Testing, 97110-Therapeutic exercises, 97530- Therapeutic activity, 97112- Neuromuscular re-education, 97535- Self Care, [id number]- Manual therapy, [id number]- Gait training, [phone]- Electrical stimulation (unattended), [id number]- Ultrasound, 02987- Traction (mechanical), [id number]- Ionotophoresis 4mg /ml Dexamethasone , 79439 (1-2 muscles), 20561 (3+ muscles)- Dry Needling, Patient/Family education, Balance training, Stair training, Taping, Joint mobilization, Spinal mobilization, Cryotherapy, Moist heat, and Biofeedback  PLAN FOR NEXT SESSION: Continue ionto R shoulder, add scapular clocks; assess MFR and stretching to L quadratus if it helped  Ziyan Schoon, PT 01/22/2024, 3:05 PM  Date of referral: 11/20/23 [place] provider: Watt Harlene BROCKS, MD/ Referring diagnosis? M25.511 (ICD-10-CM) - Acute pain of right shoulder  Treatment diagnosis? (if different than referring diagnosis)  Acute pain of right shoulder due to trauma  Muscle weakness (generalized)  Chronic left-sided thoracic back  pain  Abnormal posture  Difficulty in walking, not elsewhere classified  What was this (referring dx) caused by? Felton Hawks of Condition: Initial Onset (within last 3 months)   Laterality: Rt  Current Functional Measure Score: DASH 20.5  Objective measurements identify impairments when they are compared to normal values, the uninvolved extremity, and prior level of function.  [  x] Yes  []  No  Objective assessment of functional ability: Moderate functional limitations   Briefly describe symptoms: R shoulder pain s/p fall with 3-4-/5 strength in the R shoulder, inability to lift any significant weight overhead with R arm, difficulty doing housework, Engineer, manufacturing systems, laundry, any reaching overhead  How did symptoms start: fall 11/10/23  Average pain intensity:  Last 24 hours: 2-3  Past week: 2-3  How often does the pt experience symptoms? Frequently  How much have the symptoms interfered with usual daily activities? Quite a bit  How has condition changed since care began at this facility? Better  In general, how is the patients overall health? Fair  Onset date: 11/10/23   BACK PAIN (STarT Back Screening Tool) - (When applicable):  Has your back pain spread down your leg(s) at sometime in the last 2 weeks? []  Yes   [x]  No Have you had pain in the shoulder or neck at sometime in the past 2 weeks? [x]  Yes   []  No Have you only walked short distances because of your back pain? []  Yes   [x]  No In the past 2 weeks, have you dressed more slowly than usual because of your back pain? []  Yes   [x]  No Do you think it is not really safe for person with a condition like yours to be physically active? [x]  Yes   []  No Have worrying thoughts been going through your mind a lot of the time? []  Yes   [x]  No Do you feel that your back pain is terrible and it is never going to get any better? []  Yes   [x]  No In general, have you stopped enjoying all the things you usually enjoy? []  Yes   [x]   No Overall, how bothersome has your back pain been in the last 2 weeks? []  Not at all   []  Slightly     [x]  Moderate   []  Very much     []  Extremely

## 2024-02-19 ENCOUNTER — Ambulatory Visit: Attending: Family Medicine | Admitting: Rehabilitation

## 2024-02-19 DIAGNOSIS — R293 Abnormal posture: Secondary | ICD-10-CM | POA: Diagnosis present

## 2024-02-19 DIAGNOSIS — M25561 Pain in right knee: Secondary | ICD-10-CM | POA: Insufficient documentation

## 2024-02-19 DIAGNOSIS — M25511 Pain in right shoulder: Secondary | ICD-10-CM | POA: Insufficient documentation

## 2024-02-19 DIAGNOSIS — G8929 Other chronic pain: Secondary | ICD-10-CM | POA: Insufficient documentation

## 2024-02-19 DIAGNOSIS — G8911 Acute pain due to trauma: Secondary | ICD-10-CM | POA: Insufficient documentation

## 2024-02-19 DIAGNOSIS — M546 Pain in thoracic spine: Secondary | ICD-10-CM | POA: Diagnosis present

## 2024-02-19 DIAGNOSIS — M6281 Muscle weakness (generalized): Secondary | ICD-10-CM | POA: Diagnosis present

## 2024-02-19 DIAGNOSIS — R262 Difficulty in walking, not elsewhere classified: Secondary | ICD-10-CM | POA: Diagnosis present

## 2024-02-19 NOTE — Therapy (Signed)
 OUTPATIENT PHYSICAL THERAPY SHOULDER AND KNEE RECERTIFICATION   Patient Name: Sheila Schmidt MRN: [id number] DOB:March 27, 1936, 88 y.o., female Today's Date: 02/23/2024   END OF SESSION:      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   Dysrhythmia    Afib   Heart murmur    Hx of adenomatous colonic polyps    Hyperlipidemia    Presence of permanent cardiac pacemaker    Past Surgical History:  Procedure Laterality Date   ATRIAL FLUTTER ABLATION     BIV PACEMAKER INSERTION CRT-P N/A 12/04/2021   Procedure: BIV PACEMAKER INSERTION CRT-P;  Surgeon: Cindie Ole DASEN, MD;  Location: [facility];  Service: Cardiovascular;  Laterality: N/A;   BREAST BIOPSY Left 07/30/2022   US  LT BREAST BX W LOC DEV 1ST LESION IMG BX SPEC US  GUIDE 07/30/2022 [facility]   BREAST BIOPSY Left 09/10/2022   US  LT RADIOACTIVE SEED LOC 09/10/2022 [facility]   BREAST BIOPSY Left 09/10/2022   US  LT RADIOACTIVE SEED LOC 09/10/2022 [facility]   BREAST EXCISIONAL BIOPSY Left 2010   benign   BREAST LUMPECTOMY WITH RADIOACTIVE SEED AND SENTINEL LYMPH NODE BIOPSY Left 09/11/2022   Procedure: LEFT BREAST LUMPECTOMY WITH RADIOACTIVE SEED;  Surgeon: Vanderbilt Ned, MD;  Location: [facility] OR;  Service: General;  Laterality: Left;   CARDIOVERSION  05/2020   in [facility]   CARDIOVERSION N/A 06/21/2021   Procedure: CARDIOVERSION;  Surgeon: Barbaraann Darryle Ned, MD;  Location: [facility] ENDOSCOPY;  Service: Cardiovascular;  Laterality: N/A;   CARDIOVERSION N/A 03/26/2022   Procedure: CARDIOVERSION;  Surgeon: Pietro Redell RAMAN, MD;  Location: [facility] ENDOSCOPY;  Service: Cardiovascular;  Laterality: N/A;   CHOLECYSTECTOMY     CHOLECYSTECTOMY, LAPAROSCOPIC  '06   [name]   KNEE ARTHROSCOPY     Left '00 [place]/ Right '08   lumpectomy- remote     benign   RADIOACTIVE SEED GUIDED  AXILLARY SENTINEL LYMPH NODE Left 09/11/2022   Procedure: RADIOACTIVE SEED GUIDED LEFT SENTINEL LYMPH NODE BIOPSY;  Surgeon: Vanderbilt Ned, MD;  Location: [facility] OR;  Service: General;  Laterality: Left;   TOOTH EXTRACTION     TOTAL KNEE ARTHROPLASTY  02/05/2011   left   Patient Active Problem List   Diagnosis Date Noted   Acute respiratory failure with hypoxia (HCC) 07/03/2023   Acute hypoxic respiratory failure (HCC) 07/03/2023   Hypertensive disorder 01/15/2023   Dyslipidemia 01/15/2023   Genetic testing 09/18/2022   Pacemaker [employer] device BiV 09/06/2022   Malignant neoplasm of upper-outer quadrant of left breast in female, estrogen receptor positive (HCC) 08/06/2022   Pure hypercholesterolemia 05/15/2022   Skin cancer 03/07/2022   Closed nondisplaced fracture of distal phalanx of right thumb 02/15/2022   Cancer (HCC) 05/25/2021   Left bundle branch block 04/12/2021   Dilated cardiomyopathy (HCC) 04/12/2021   Vasculitis (HCC) 02/22/2021   Anxiety state 02/12/2021   Body fluid retention 02/12/2021   Cervical intraepithelial neoplasia 02/12/2021   Insomnia 02/12/2021   Pruritus of vulva 02/12/2021   Leg wound, right 10/03/2020   Bilateral primary osteoarthritis of knee 03/30/2019   Paroxysmal atrial fibrillation (HCC) 04/07/2015   Chronic anticoagulation 04/07/2015   Paroxysmal atrial flutter (HCC) 03/16/2015   Essential hypertension, benign 02/24/2014   Sciatica 12/08/2013   Hyperlipidemia 09/25/2013   Osteopenia 11/19/2012  Low back pain 01/26/2010   Carcinoma in situ of breast 09/04/2009   DEPRESSION, PROLONGED 09/04/2009   OA (osteoarthritis) of knee 09/04/2009   PERIPHERAL EDEMA 09/04/2009   History of colonic polyps 09/04/2009    PCP: Copland, Harlene BROCKS, MD   REFERRING PROVIDER: Watt Harlene BROCKS, MD   REFERRING DIAG: M25.511 (ICD-10-CM) - Acute pain of right shoulder   THERAPY DIAG:  Acute pain of right shoulder due to trauma - Plan: PT plan of care  cert/re-cert  Muscle weakness (generalized) - Plan: PT plan of care cert/re-cert  Chronic left-sided thoracic back pain - Plan: PT plan of care cert/re-cert  Abnormal posture - Plan: PT plan of care cert/re-cert  Difficulty in walking, not elsewhere classified - Plan: PT plan of care cert/re-cert  Chronic pain of right knee - Plan: PT plan of care cert/re-cert  RATIONALE FOR EVALUATION AND TREATMENT: Rehabilitation  ONSET DATE: 11/10/23  NEXT MD VISIT:  f/u PRN   SUBJECTIVE:                                                                                                                                                                                                         SUBJECTIVE STATEMENT: Patient has been unable to come in for PT due to lack of insurance approval which is unfortunate.   She states she is doing her home exercises daily, but the R shoulder is still causing her pain with lifting any significant amount of weight.   She is able to lift the RUE alone, but not any weight.  She has also started having worsening R knee pain since her last visit.   She attributes this to OA, which is known.   She cannot have a TKA without increased risk due to her pacemaker and is not going to have any more elective surgeries due to this.  She rates her R knee pain today 6/10 and R shoulder 0/10 resting and 3/10 with lifting weight  INITIAL EVALUATION:  Patient is an 88 y/o female referred to PT for R shoulder pain and LBP following a fall 11/10/23 on vacation in the hotel.   States fell coming out of bathroom with floor transition from floor to carpet.  States hit R shoulder against the wall on the way down.  R shoulder pain resulted from the fall.   Patient reports significant bruising that has now resolved.    R shoulder xrays showed no fx's.  She has been using Salonpas with some relief.   LBP is actually more L sided thoracic in nature,  and not a new issue.   The pain is intermittent and worse  with making her bed in forward flexed reaching position and with vacuuming.    PAIN: Are you having pain? Yes: NPRS scale: R shoulder 0/10 resting, 2-3/10 with certain movements;   L thoracic spine:  0/10 resting; 5-6/10 worst with activities Pain location: R shoulder Pain description: subtle pinching pain Aggravating factors: elevating the R shoulder into full elevation Relieving factors: not elevating or lifting the R arm  PERTINENT HISTORY:  recurrent L breast CA with node dissection 4/24, dilated cardiomyopathy, Afib, pacemaker, HTN, anxiety, LBP, sciatica, anxiety  PRECAUTIONS: ICD/Pacemaker, Lymphedema precautions LUE due to breast CA  RED FLAGS: None  HAND DOMINANCE: Right  WEIGHT BEARING RESTRICTIONS: No  FALLS:  Has patient fallen in last 6 months? Yes. Number of falls 1  LIVING ENVIRONMENT: Lives with: lives with their daughter Lives in: House/apartment Stairs: Yes: Internal: 12 steps; on right going up and External: 4 steps; on right going up Has following equipment at home: Single point cane and Walker - 2 wheeled  OCCUPATION: retired from [employer] banker, corporate work  PLOF: Independent with community mobility with device  PATIENT GOALS: get my shoulder pain resolved, and walk better on the R knee (OA)   OBJECTIVE: (objective measures completed at initial evaluation unless otherwise dated)  DIAGNOSTIC FINDINGS:  CLINICAL DATA:  Right shoulder pain after fall 5 days ago.   EXAM: RIGHT SHOULDER - 2+ VIEW   COMPARISON:  None Available.   FINDINGS: There is no evidence of fracture or dislocation. There is no evidence of arthropathy or other focal bone abnormality. Soft tissues are unremarkable.   IMPRESSION: Negative.     Electronically Signed   By: Lynwood Landy Raddle M.D.   On: 11/15/2023 15:45  PATIENT SURVEYS:  Quick Dash   COGNITION: Overall cognitive status: Within functional limits for tasks assessed     SENSATION: WFL  POSTURE: rounded shoulders,  forward head, and R scoliosis  UPPER EXTREMITY ROM:   Active ROM Right eval Left eval RUE 02/19/24  Shoulder flexion 160 165 160  Shoulder extension 70 70 70  Shoulder abduction 150 165 160  Shoulder adduction     Shoulder internal rotation 100 100 95  Shoulder external rotation 100 110 104  Elbow flexion     Elbow extension     Wrist flexion     Wrist extension     Wrist ulnar deviation     Wrist radial deviation     Wrist pronation     Wrist supination     (Blank rows = not tested)  UPPER EXTREMITY MMT:  MMT Right eval Left eval RUE 02/19/24  Shoulder flexion 4- p! 5 4- but mininimal pain  Shoulder extension 4- 4 4  Shoulder abduction 4- p! 5 4- min to no pain  Shoulder adduction     Shoulder internal rotation 4 5 4+  Shoulder external rotation 3- p! 5 3  Middle trapezius     Lower trapezius     Elbow flexion     Elbow extension     Wrist flexion     Wrist extension     Wrist ulnar deviation     Wrist radial deviation     Wrist pronation     Wrist supination     Grip strength (lbs)     (Blank rows = not tested) LUMBAR ROM:   Active  A/PROM  eval 02/19/24  Flexion 90% ROM 100%  Extension 90%  ROM 100%  Right lateral flexion To knee jt Knee joint  Left lateral flexion Above knee jt Knee joint  Right rotation 90% 100%  Left rotation 75% 88%   (Blank rows = not tested)  LOWER EXTREMITY ROM:     Active  Right 02/19/24 Left 02/19/24  Hip flexion    Hip extension    Hip abduction    Hip adduction    Hip internal rotation    Hip external rotation    Knee flexion 122 129  Knee extension 0 0  Ankle dorsiflexion    Ankle plantarflexion    Ankle inversion    Ankle eversion     (Blank rows = not tested)  LOWER EXTREMITY MMT:    MMT Right 02/19/24 Left 02/19/24  Hip flexion 4 4  Hip extension    Hip abduction 3+ 3+  Hip adduction    Hip internal rotation 4- 4-  Hip external rotation 4 4+  Knee flexion 4 4+  Knee extension 4; pain 5  Ankle  dorsiflexion    Ankle plantarflexion    Ankle inversion    Ankle eversion     (Blank rows = not tested)   SHOULDER SPECIAL TESTS: Impingement tests: Neer impingement test: positive , Hawkins/Kennedy impingement test: negative, and Painful arc test: negative SLAP lesions: Biceps load test: positive  Instability tests: Apprehension test: negative Rotator cuff assessment: Drop arm test: negative, Empty can test: positive , Full can test: positive , and Gerber lift off test: negative Biceps assessment: Speed's test: positive   LUMBAR SPECIAL TESTS:  Straight leg raise test: Negative   SLR RLE is 70 deg, LLE is 80 deg  FUNCTIONAL TESTING:  5X STS  = 21.35 sec  TUG = 17.53 sec-    JOINT MOBILITY TESTING:  hypermobile Bilateral shoulder joint accessory motions  PALPATION:  TTP over the R greater tuberosity, nontender over coracoid and posterior R shoulder;     TTP over L T-10 and T11 facets and paraspinals    TODAY'S TREATMENT:  02/19/24 THERAPEUTIC EXERCISE: To improve strength.  Demonstration, verbal and tactile cues throughout for technique. Nu-step L3 x 6' BUE/BLE  THERAPEUTIC ACTIVITIES: To improve functional performance.  Demonstration, verbal and tactile cues throughout for technique. Recheck of R shoulder strength and ROM (see tables) Supine ER/IR 3# x 2/15 RUE Supine press up 0# x 2/10 RUE Circles at 90 deg flexion 3# x 15 CW; x15 CCW RUE w/ PT guidance Supine shoulder abd slides on mat to 90 deg x 2/10 RUE Strength and ROM checked BUE, BLE  HP to R shoulder and R knee in supine x 15'   PATIENT EDUCATION:  Education details: using a cane for safety and to decrease pain R knee; using heat for arthritic pain in the R knee;  f/u with orthopedist for other treatment options  Person educated: Patient Education method: Explanation, Demonstration, Verbal cues, Tactile cues, and Handouts Education comprehension: verbalized understanding, verbal cues required, tactile cues  required, and needs further education  HOME EXERCISE PROGRAM: Access Code: [email or web address] URL: [email or web address] Date: 12/11/2023 Prepared by: Garnette Montclair  Exercises - Sidelying Shoulder Horizontal Abduction  - 1 x daily - 7 x weekly - 1 sets - 20 reps - Supine Shoulder Circles  - 1 x daily - 7 x weekly - 1 sets - 20 reps - Seated Shoulder External Rotation AAROM with Dowel  - 1 x daily - 7 x weekly - 1 sets - 20 reps - Seated Single Arm Shoulder  External Rotation with Towel  - 1 x daily - 7 x weekly - 1 sets - 20 reps - Seated Shoulder External Rotation AROM in Supported Abduction  - 1 x daily - 7 x weekly - 1 sets - 20 reps   ASSESSMENT:  CLINICAL IMPRESSION: Patient has been seen x 7 PT visits over the last 2 months for R shoulder and L lumbothoracic pain.   She has made fair overall progress, but is limited by poor insurance coverage.  She has such a high copay that she can only come to PT once weekly and her insurance plan has not given her very many approved visits either.   This has been a major barrier to progress.  She is feeling a little better with the R shoulder and her strength has improved (see tables above).   She can now lift her R arm whereas she could not on her initial visit.   However, she cannot lift any weight with the RUE without weakness and pain.   Her L low back pain has improved for the moment, but still bothers her.   She is also now c/o R knee pain which she attributes to OA.   Her knee strength and ROM are checked today and have some deficits.   She is requesting PT for R knee pain so we will include this in our recertification orders that are going to be sent to Dr Watt for signature from today's visit.   PT remains necessary for R knee pain, R shoulder pain, weakness of the R shoulder and R knee, and decreasd HEP knowledge.   She is agreeable to the POC  CHRISHELLE ZITO is a 88 y.o. female who was referred to physical therapy for evaluation  and treatment for R shoulder pain and L lower thoracic pain.    Patient reports onset of R shoulder pain beginning approximately 1 month ago s/p fall. Pain is worse with reaching overhead, lifting any weight with the RUE past shoulder level, and haircare.  Patient has deficits in lumbar flexibility, R shoulder strength, abnormal posture, and TTP with abnormal muscle tension R shoulder and L thoracic spine/BLE which are interfering with ADLs and are impacting quality of life.  On QuickDASH patient scored 20.5/100 demonstrating 20.5% disability.  Kyra will benefit from skilled PT to address above deficits to improve mobility and activity tolerance with decreased pain interference.   OBJECTIVE IMPAIRMENTS: Abnormal gait, decreased balance, decreased strength, and postural dysfunction.   ACTIVITY LIMITATIONS: lifting, stairs, reach over head, and locomotion level  PARTICIPATION LIMITATIONS: cleaning, laundry, and making bed, vacuuming  PERSONAL FACTORS: Age and 1-2 comorbidities: recurrent L breast CA with node dissection 4/24, dilated cardiomyopathy, Afib, pacemaker, HTN, anxiety, LBP, anxiety are also affecting patient's functional outcome.   REHAB POTENTIAL: Good  CLINICAL DECISION MAKING: Evolving/moderate complexity  EVALUATION COMPLEXITY: Moderate   GOALS: Goals reviewed with patient? Yes  SHORT TERM GOALS: Target date: 03/21/24  Patient will be independent with HEP for R knee pain.  Baseline: will give HEP next visit since we have not been seeing her for knee pain Goal status: INITIAL  2.  Patient will report 25% improvement in R knee pain to improve QOL.   Baseline:  R knee is 5/10 Goal status: IN PROGRESS   LONG TERM GOALS: Target date: 04/18/24  Patient will be independent with ongoing/advanced HEP for self-management at home.  Baseline: has no advanced HEP for R knee;  still progressing with R shoulder Goal status:  IN PROGRESS  2.  Patient will report 50-75% improvement in  R shoulder and L thoracic pain to improve QOL.  Baseline: R knee 5/10;  R shoulder 3/10 with lifting weight overhead Goal status: IN PROGRESS  3.  Patient to demonstrate improved upright posture with posterior shoulder girdle engaged to promote improved glenohumeral joint mobility. Baseline: forward head/rounded shoulders Goal status: INITIAL  4.  Patient to improve L thoracic sidebending and rotation to equal bilaterally/90% without pain provocation to allow for increased ease of ADLs.  Baseline: Refer to above lumbar ROM table Goal status: INITIAL  5.  Patient will demonstrate improved R shoulder strength to >/= 4+/5 for functional UE use. Baseline: Refer to above UE MMT table Goal status: INITIAL  6  Patient will report </= 5%% on QuickDASH (MCID = 14%) to demonstrate improved functional ability.  Baseline: 20.5 Goal status: INITIAL  7.  Patient will report no falls and improve on functional strength/balance testing (TBD next visit if able) to WNL for agegroup to reduce fall risk   Baseline: TBD  Goal status: INITIAL    8.  Patient will demonstrate 5/5 R knee quad strength for improved function and improved QOL   Baseline:  4/5   Goal Status:  INITIAL   9.  Patient will report 50-75% subjective improvement in her R knee pain   Baseline:  02/19/24:  6/10 R knee   Goal status:  IN PROGRESS   PLAN:  PT FREQUENCY: 1x/week  PT DURATION: 8 weeks  PLANNED INTERVENTIONS: 97164- PT Re-evaluation, 97750- Physical Performance Testing, 97110-Therapeutic exercises, 97530- Therapeutic activity, 97112- Neuromuscular re-education, 97535- Self Care, 02859- Manual therapy, [id number]- Gait training, [phone]- Electrical stimulation (unattended), [id number]- Ultrasound, [id number]- Traction (mechanical), [id number]- Ionotophoresis 4mg /ml Dexamethasone , 79439 (1-2 muscles), [id number] (3+ muscles)- Dry Needling, Patient/Family education, Balance training, Stair training, Taping, Joint mobilization, Spinal mobilization,  Cryotherapy, Moist heat, and Biofeedback  PLAN FOR NEXT SESSIONBETHA RED SENIOR, PT 02/23/2024, 10:42 PM  Date of referral: 11/20/23 [place] provider: Watt Harlene BROCKS, MD/ Referring diagnosis? M25.511 (ICD-10-CM) - Acute pain of right shoulder  Treatment diagnosis? (if different than referring diagnosis)  Acute pain of right shoulder due to trauma - Plan: PT plan of care cert/re-cert  Muscle weakness (generalized) - Plan: PT plan of care cert/re-cert  Chronic left-sided thoracic back pain - Plan: PT plan of care cert/re-cert  Abnormal posture - Plan: PT plan of care cert/re-cert  Difficulty in walking, not elsewhere classified - Plan: PT plan of care cert/re-cert  Chronic pain of right knee - Plan: PT plan of care cert/re-cert  What was this (referring dx) caused by? Felton Hawks of Condition: Initial Onset (within last 3 months)   Laterality: Rt  Current Functional Measure Score: DASH 20.5  Objective measurements identify impairments when they are compared to normal values, the uninvolved extremity, and prior level of function.  [x]  Yes  []  No  Objective assessment of functional ability: Moderate functional limitations   Briefly describe symptoms: R shoulder pain s/p fall with 3-4-/5 strength in the R shoulder, inability to lift any significant weight overhead with R arm, difficulty doing housework, Engineer, manufacturing systems, laundry, any reaching overhead  How did symptoms start: fall 11/10/23  Average pain intensity:  Last 24 hours: 2-3  Past week: 2-3  How often does the pt experience symptoms? Frequently  How much have the symptoms interfered with usual daily activities? Quite a bit  How has condition changed since care began at this facility? Better  In general,  how is the patients overall health? Fair  Onset date: 11/10/23   BACK PAIN (STarT Back Screening Tool) - (When applicable):  Has your back pain spread down your leg(s) at sometime in the last 2 weeks? []   Yes   [x]  No Have you had pain in the shoulder or neck at sometime in the past 2 weeks? [x]  Yes   []  No Have you only walked short distances because of your back pain? []  Yes   [x]  No In the past 2 weeks, have you dressed more slowly than usual because of your back pain? []  Yes   [x]  No Do you think it is not really safe for person with a condition like yours to be physically active? [x]  Yes   []  No Have worrying thoughts been going through your mind a lot of the time? []  Yes   [x]  No Do you feel that your back pain is terrible and it is never going to get any better? []  Yes   [x]  No In general, have you stopped enjoying all the things you usually enjoy? []  Yes   [x]  No Overall, how bothersome has your back pain been in the last 2 weeks? []  Not at all   []  Slightly     [x]  Moderate   []  Very much     []  Extremely

## 2024-02-24 ENCOUNTER — Encounter: Payer: Self-pay | Admitting: Rehabilitation

## 2024-02-24 ENCOUNTER — Ambulatory Visit: Admitting: Rehabilitation

## 2024-02-24 DIAGNOSIS — M6281 Muscle weakness (generalized): Secondary | ICD-10-CM

## 2024-02-24 DIAGNOSIS — R293 Abnormal posture: Secondary | ICD-10-CM

## 2024-02-24 DIAGNOSIS — G8929 Other chronic pain: Secondary | ICD-10-CM

## 2024-02-24 DIAGNOSIS — R262 Difficulty in walking, not elsewhere classified: Secondary | ICD-10-CM

## 2024-02-24 DIAGNOSIS — G8911 Acute pain due to trauma: Secondary | ICD-10-CM

## 2024-02-24 DIAGNOSIS — M25511 Pain in right shoulder: Secondary | ICD-10-CM | POA: Diagnosis not present

## 2024-02-24 NOTE — Therapy (Signed)
 OUTPATIENT PHYSICAL THERAPY VISIT  Patient Name: Sheila Schmidt MRN: [id number] DOB:Apr 26, 1936, 88 y.o., female Today's Date: 02/24/2024   END OF SESSION:  PT End of Session - 02/24/24 1318     Visit Number 8    Date for PT Re-Evaluation 02/05/24    Authorization Type [employer] MCR    PT Start Time 1314    PT Stop Time 1400    PT Time Calculation (min) 46 min    Activity Tolerance Patient tolerated treatment well    Behavior During Therapy WFL for tasks assessed/performed             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   Dysrhythmia    Afib   Heart murmur    Hx of adenomatous colonic polyps    Hyperlipidemia    Presence of permanent cardiac pacemaker    Past Surgical History:  Procedure Laterality Date   ATRIAL FLUTTER ABLATION     BIV PACEMAKER INSERTION CRT-P N/A 12/04/2021   Procedure: BIV PACEMAKER INSERTION CRT-P;  Surgeon: Cindie Ole DASEN, MD;  Location: [facility];  Service: Cardiovascular;  Laterality: N/A;   BREAST BIOPSY Left 07/30/2022   US  LT BREAST BX W LOC DEV 1ST LESION IMG BX SPEC US  GUIDE 07/30/2022 [facility]   BREAST BIOPSY Left 09/10/2022   US  LT RADIOACTIVE SEED LOC 09/10/2022 [facility]   BREAST BIOPSY Left 09/10/2022   US  LT RADIOACTIVE SEED LOC 09/10/2022 [facility]   BREAST EXCISIONAL BIOPSY Left 2010   benign   BREAST LUMPECTOMY WITH RADIOACTIVE SEED AND SENTINEL LYMPH NODE BIOPSY Left 09/11/2022   Procedure: LEFT BREAST LUMPECTOMY WITH RADIOACTIVE SEED;  Surgeon: Vanderbilt Ned, MD;  Location: [facility] OR;  Service: General;  Laterality: Left;   CARDIOVERSION  05/2020   in [facility]   CARDIOVERSION N/A 06/21/2021   Procedure: CARDIOVERSION;  Surgeon: Barbaraann Darryle Ned, MD;  Location: [facility] ENDOSCOPY;  Service: Cardiovascular;  Laterality: N/A;   CARDIOVERSION N/A 03/26/2022    Procedure: CARDIOVERSION;  Surgeon: Pietro Redell RAMAN, MD;  Location: [facility] ENDOSCOPY;  Service: Cardiovascular;  Laterality: N/A;   CHOLECYSTECTOMY     CHOLECYSTECTOMY, LAPAROSCOPIC  '06   [name]   KNEE ARTHROSCOPY     Left '00 [place]/ Right '08   lumpectomy- remote     benign   RADIOACTIVE SEED GUIDED AXILLARY SENTINEL LYMPH NODE Left 09/11/2022   Procedure: RADIOACTIVE SEED GUIDED LEFT SENTINEL LYMPH NODE BIOPSY;  Surgeon: Vanderbilt Ned, MD;  Location: [facility] OR;  Service: General;  Laterality: Left;   TOOTH EXTRACTION     TOTAL KNEE ARTHROPLASTY  02/05/2011   left   Patient Active Problem List   Diagnosis Date Noted   Acute respiratory failure with hypoxia (HCC) 07/03/2023   Acute hypoxic respiratory failure (HCC) 07/03/2023   Hypertensive disorder 01/15/2023   Dyslipidemia 01/15/2023   Genetic testing 09/18/2022   Pacemaker [employer] device BiV 09/06/2022   Malignant neoplasm of upper-outer quadrant of left breast in female, estrogen receptor positive (HCC) 08/06/2022   Pure hypercholesterolemia 05/15/2022   Skin cancer 03/07/2022   Closed nondisplaced fracture of distal phalanx of right thumb 02/15/2022   Cancer (HCC) 05/25/2021   Left bundle branch block 04/12/2021   Dilated cardiomyopathy (HCC) 04/12/2021   Vasculitis (HCC) 02/22/2021   Anxiety state 02/12/2021  Body fluid retention 02/12/2021   Cervical intraepithelial neoplasia 02/12/2021   Insomnia 02/12/2021   Pruritus of vulva 02/12/2021   Leg wound, right 10/03/2020   Bilateral primary osteoarthritis of knee 03/30/2019   Paroxysmal atrial fibrillation (HCC) 04/07/2015   Chronic anticoagulation 04/07/2015   Paroxysmal atrial flutter (HCC) 03/16/2015   Essential hypertension, benign 02/24/2014   Sciatica 12/08/2013   Hyperlipidemia 09/25/2013   Osteopenia 11/19/2012   Low back pain 01/26/2010   Carcinoma in situ of breast 09/04/2009   DEPRESSION, PROLONGED 09/04/2009   OA (osteoarthritis) of knee 09/04/2009    PERIPHERAL EDEMA 09/04/2009   History of colonic polyps 09/04/2009    PCP: Copland, Harlene BROCKS, MD   REFERRING PROVIDER: Watt Harlene BROCKS, MD   REFERRING DIAG: M25.511 (ICD-10-CM) - Acute pain of right shoulder   THERAPY DIAG:  Acute pain of right shoulder due to trauma  Muscle weakness (generalized)  Chronic left-sided thoracic back pain  Abnormal posture  Difficulty in walking, not elsewhere classified  Chronic pain of right knee  RATIONALE FOR EVALUATION AND TREATMENT: Rehabilitation  ONSET DATE: 11/10/23  NEXT MD VISIT:  f/u PRN   SUBJECTIVE:                                                                                                                                                                                                         SUBJECTIVE STATEMENT:  Patient reports her R knee pain is her biggest c/o at this time.  States her L lumbothoracic pain is better and not bothering her anymore.  R shoulder mainly hurts when she is lifting things.    INITIAL EVALUATION:  Patient is an 88 y/o female referred to PT for R shoulder pain and LBP following a fall 11/10/23 on vacation in the hotel.   States fell coming out of bathroom with floor transition from floor to carpet.  States hit R shoulder against the wall on the way down.  R shoulder pain resulted from the fall.   Patient reports significant bruising that has now resolved.    R shoulder xrays showed no fx's.  She has been using Salonpas with some relief.   LBP is actually more L sided thoracic in nature, and not a new issue.   The pain is intermittent and worse with making her bed in forward flexed reaching position and with vacuuming.    PAIN: Are you having pain? Yes: NPRS scale: R shoulder 0/10 resting, 2-3/10 with certain movements;   L thoracic spine:  0/10 resting; 5-6/10 worst with activities Pain location: R  shoulder Pain description: subtle pinching pain Aggravating factors: elevating the R shoulder into  full elevation Relieving factors: not elevating or lifting the R arm  PERTINENT HISTORY:  recurrent L breast CA with node dissection 4/24, dilated cardiomyopathy, Afib, pacemaker, HTN, anxiety, LBP, sciatica, anxiety  PRECAUTIONS: ICD/Pacemaker, Lymphedema precautions LUE due to breast CA  RED FLAGS: None  HAND DOMINANCE: Right  WEIGHT BEARING RESTRICTIONS: No  FALLS:  Has patient fallen in last 6 months? Yes. Number of falls 1  LIVING ENVIRONMENT: Lives with: lives with their daughter Lives in: House/apartment Stairs: Yes: Internal: 12 steps; on right going up and External: 4 steps; on right going up Has following equipment at home: Single point cane and Walker - 2 wheeled  OCCUPATION: retired from [employer] banker, corporate work  PLOF: Independent with community mobility with device  PATIENT GOALS: get my shoulder pain resolved, and walk better on the R knee (OA)   OBJECTIVE: (objective measures completed at initial evaluation unless otherwise dated)  DIAGNOSTIC FINDINGS:  CLINICAL DATA:  Right shoulder pain after fall 5 days ago.   EXAM: RIGHT SHOULDER - 2+ VIEW   COMPARISON:  None Available.   FINDINGS: There is no evidence of fracture or dislocation. There is no evidence of arthropathy or other focal bone abnormality. Soft tissues are unremarkable.   IMPRESSION: Negative.     Electronically Signed   By: Lynwood Landy Raddle M.D.   On: 11/15/2023 15:45  PATIENT SURVEYS:  Quick Dash   COGNITION: Overall cognitive status: Within functional limits for tasks assessed     SENSATION: WFL  POSTURE: rounded shoulders, forward head, and R scoliosis  UPPER EXTREMITY ROM:   Active ROM Right eval Left eval RUE 02/19/24  Shoulder flexion 160 165 160  Shoulder extension 70 70 70  Shoulder abduction 150 165 160  Shoulder adduction     Shoulder internal rotation 100 100 95  Shoulder external rotation 100 110 104  Elbow flexion     Elbow extension     Wrist flexion      Wrist extension     Wrist ulnar deviation     Wrist radial deviation     Wrist pronation     Wrist supination     (Blank rows = not tested)  UPPER EXTREMITY MMT:  MMT Right eval Left eval RUE 02/19/24  Shoulder flexion 4- p! 5 4- but mininimal pain  Shoulder extension 4- 4 4  Shoulder abduction 4- p! 5 4- min to no pain  Shoulder adduction     Shoulder internal rotation 4 5 4+  Shoulder external rotation 3- p! 5 3  Middle trapezius     Lower trapezius     Elbow flexion     Elbow extension     Wrist flexion     Wrist extension     Wrist ulnar deviation     Wrist radial deviation     Wrist pronation     Wrist supination     Grip strength (lbs)     (Blank rows = not tested) LUMBAR ROM:   Active  A/PROM  eval 02/19/24  Flexion 90% ROM 100%  Extension 90% ROM 100%  Right lateral flexion To knee jt Knee joint  Left lateral flexion Above knee jt Knee joint  Right rotation 90% 100%  Left rotation 75% 88%   (Blank rows = not tested)  LOWER EXTREMITY ROM:     Active  Right 02/19/24 Left 02/19/24  Hip flexion    Hip extension  Hip abduction    Hip adduction    Hip internal rotation    Hip external rotation    Knee flexion 122 129  Knee extension 0 0  Ankle dorsiflexion    Ankle plantarflexion    Ankle inversion    Ankle eversion     (Blank rows = not tested)  LOWER EXTREMITY MMT:    MMT Right 02/19/24 Left 02/19/24  Hip flexion 4 4  Hip extension    Hip abduction 3+ 3+  Hip adduction    Hip internal rotation 4- 4-  Hip external rotation 4 4+  Knee flexion 4 4+  Knee extension 4; pain 5  Ankle dorsiflexion    Ankle plantarflexion    Ankle inversion    Ankle eversion     (Blank rows = not tested)   SHOULDER SPECIAL TESTS: Impingement tests: Neer impingement test: positive , Hawkins/Kennedy impingement test: negative, and Painful arc test: negative SLAP lesions: Biceps load test: positive  Instability tests: Apprehension test: negative Rotator cuff  assessment: Drop arm test: negative, Empty can test: positive , Full can test: positive , and Gerber lift off test: negative Biceps assessment: Speed's test: positive   LUMBAR SPECIAL TESTS:  Straight leg raise test: Negative   SLR RLE is 70 deg, LLE is 80 deg  FUNCTIONAL TESTING:  5X STS  = 21.35 sec  TUG = 17.53 sec-    JOINT MOBILITY TESTING:  hypermobile Bilateral shoulder joint accessory motions  PALPATION:  TTP over the R greater tuberosity, nontender over coracoid and posterior R shoulder;     TTP over L T-10 and T11 facets and paraspinals    TODAY'S TREATMENT:  02/24/24 THERAPEUTIC EXERCISE: To improve strength.  Demonstration, verbal and tactile cues throughout for technique. Nu-step L5 x 6' BUE/BLE  Seated knee extension 5# x 3/10 RLE Seated knee flexion 10# x 3/10 BLE Standing hip flexor stretch 1' x 2 RLE Seated hip flexor stretch x 1'  RLE Supine SLR 2# x 3/10 RLE Supine Peanut ball rollouts x 30 BLE Supine peanut bridge x 2/10 BLE  Supine 0# press up x 20 RUE Supine 1# circles at 90 CW x 10; CCW x 10 SL ER 0# with PT assist for concentric, patient does eccentric x 2/10 RUE  NEUROMUSCULAR RE-EDUCATION: To improve balance. Gait with SPC with facilitation for balance, leaning to the L for appropriate assistance; going over objects and around objects for balance and encouraement to use cane at home.   02/19/24 THERAPEUTIC EXERCISE: To improve strength.  Demonstration, verbal and tactile cues throughout for technique. Nu-step L3 x 6' BUE/BLE  THERAPEUTIC ACTIVITIES: To improve functional performance.  Demonstration, verbal and tactile cues throughout for technique. Recheck of R shoulder strength and ROM (see tables) Supine ER/IR 3# x 2/15 RUE Supine press up 0# x 2/10 RUE Circles at 90 deg flexion 3# x 15 CW; x15 CCW RUE w/ PT guidance Supine shoulder abd slides on mat to 90 deg x 2/10 RUE Strength and ROM checked BUE, BLE  HP to R shoulder and R knee in  supine x 15'   PATIENT EDUCATION:  Education details: educated on R knee strengthening  Person educated: Patient Education method: Programmer, multimedia, Demonstration, Verbal cues, Tactile cues, and Handouts Education comprehension: verbalized understanding, verbal cues required, tactile cues required, and needs further education  HOME EXERCISE PROGRAM: Access Code: [email or web address] URL: [email or web address] Date: 12/11/2023 Prepared by: Garnette Montclair  Exercises - Sidelying Shoulder Horizontal Abduction  - 1 x daily - 7  x weekly - 1 sets - 20 reps - Supine Shoulder Circles  - 1 x daily - 7 x weekly - 1 sets - 20 reps - Seated Shoulder External Rotation AAROM with Dowel  - 1 x daily - 7 x weekly - 1 sets - 20 reps - Seated Single Arm Shoulder External Rotation with Towel  - 1 x daily - 7 x weekly - 1 sets - 20 reps - Seated Shoulder External Rotation AROM in Supported Abduction  - 1 x daily - 7 x weekly - 1 sets - 20 reps   ASSESSMENT:  CLINICAL IMPRESSION:  Patient is able to progress with RLE strengthening and stretching today for R knee pain.  R knee pain and R shoulder pain/weakness will be focus of care going forward if patient remains agreeable.  Sheila Schmidt is done today and is improved from 20 to 36.4%.   LEFS is done for the first time today and is 38/80.   These scores demonstrate a need for therapy for decreased function of her R shoulder and R knee with pain, weakness limiting her QOL.   Will plan to continue with PT to address these issues and hopefully her insurance will approve at least another 1-2 months of PT.  She can only come once weekly due to the excessively high copay.  Sheila Schmidt is a 88 y.o. female who was referred to physical therapy for evaluation and treatment for R shoulder pain and L lower thoracic pain.    Patient reports onset of R shoulder pain beginning approximately 1 month ago s/p fall. Pain is worse with reaching overhead, lifting any weight with  the RUE past shoulder level, and haircare.  Patient has deficits in lumbar flexibility, R shoulder strength, abnormal posture, and TTP with abnormal muscle tension R shoulder and L thoracic spine/BLE which are interfering with ADLs and are impacting quality of life.  On QuickDASH patient scored 20.5/100 demonstrating 20.5% disability.  Sheila Schmidt will benefit from skilled PT to address above deficits to improve mobility and activity tolerance with decreased pain interference.   OBJECTIVE IMPAIRMENTS: Abnormal gait, decreased balance, decreased strength, and postural dysfunction.   ACTIVITY LIMITATIONS: lifting, stairs, reach over head, and locomotion level  PARTICIPATION LIMITATIONS: cleaning, laundry, and making bed, vacuuming  PERSONAL FACTORS: Age and 1-2 comorbidities: recurrent L breast CA with node dissection 4/24, dilated cardiomyopathy, Afib, pacemaker, HTN, anxiety, LBP, anxiety are also affecting patient's functional outcome.   REHAB POTENTIAL: Good  CLINICAL DECISION MAKING: Evolving/moderate complexity  EVALUATION COMPLEXITY: Moderate   GOALS: Goals reviewed with patient? Yes  SHORT TERM GOALS: Target date: 03/21/24  Patient will be independent with HEP for R knee pain.  Baseline: will give HEP next visit since we have not been seeing her for knee pain Goal status: INITIAL  2.  Patient will report 25% improvement in R knee pain to improve QOL.   Baseline:  R knee is 5/10 Goal status: IN PROGRESS   LONG TERM GOALS: Target date: 04/18/24  Patient will be independent with ongoing/advanced HEP for self-management at home.  Baseline: has no advanced HEP for R knee;  still progressing with R shoulder Goal status: IN PROGRESS  2.  Patient will report 50-75% improvement in R shoulder and L thoracic pain to improve QOL.  Baseline: R knee 5/10;  R shoulder 3/10 with lifting weight overhead Goal status: IN PROGRESS  3.  Patient to demonstrate improved upright posture with  posterior shoulder girdle engaged to promote improved glenohumeral joint  mobility. Baseline: forward head/rounded shoulders Goal status: INITIAL  4.  Patient to improve L thoracic sidebending and rotation to equal bilaterally/90% without pain provocation to allow for increased ease of ADLs.  Baseline: Refer to above lumbar ROM table Goal status: INITIAL  5.  Patient will demonstrate improved R shoulder strength to >/= 4+/5 for functional UE use. Baseline: Refer to above UE MMT table Goal status: INITIAL  6  Patient will report </= 5%% on QuickDASH (MCID = 14%) to demonstrate improved functional ability.  Baseline: 20.5 02/24/24:      36.4 / 100 = 36.4 % Goal status: IN PROGRESS  7.  Patient will report no falls and improve on functional strength/balance testing (TBD next visit if able) to WNL for agegroup to reduce fall risk   Baseline: TBD  Goal status: INITIAL    8.  Patient will demonstrate 5/5 R knee quad strength for improved function and improved QOL   Baseline:  4/5   Goal Status:  INITIAL   9.  Patient will report 50-75% subjective improvement in her R knee pain   Baseline:  02/19/24:  6/10 R knee   Goal status:  IN PROGRESS   10.  Patient will report >/= 48/80 on LEFS (MCID = 9 pts ) for improved ability to ADL and improved QOL   Baseline:  38/80   Goal status:  INITIAL   PLAN:  PT FREQUENCY: 1x/week  PT DURATION: 8 weeks  PLANNED INTERVENTIONS: 97164- PT Re-evaluation, 97750- Physical Performance Testing, 97110-Therapeutic exercises, 97530- Therapeutic activity, [id number]- Neuromuscular re-education, 97535- Self Care, [id number]- Manual therapy, [id number]- Gait training, [phone]- Electrical stimulation (unattended), [id number]- Ultrasound, [id number]- Traction (mechanical), [id number]- Ionotophoresis 4mg /ml Dexamethasone , 79439 (1-2 muscles), [id number] (3+ muscles)- Dry Needling, Patient/Family education, Balance training, Stair training, Taping, Joint mobilization, Spinal mobilization, Cryotherapy,  Moist heat, and Biofeedback  PLAN FOR NEXT SESSION: Continue to work on open and gentle closed chain R knee strengthening;  continue to address R shoulder weakness as well.  Add RDL RLE  RED SENIOR, PT 02/24/2024, 4:41 PM  Date of referral: 11/20/23 [place] provider: Watt Harlene BROCKS, MD/ Referring diagnosis? M25.511 (ICD-10-CM) - Acute pain of right shoulder  Treatment diagnosis? (if different than referring diagnosis)  Acute pain of right shoulder due to trauma  Muscle weakness (generalized)  Chronic left-sided thoracic back pain  Abnormal posture  Difficulty in walking, not elsewhere classified  Chronic pain of right knee  What was this (referring dx) caused by? Felton Hawks of Condition: Initial Onset (within last 3 months)   Laterality: Rt  Current Functional Measure Score: DASH 20.5  Objective measurements identify impairments when they are compared to normal values, the uninvolved extremity, and prior level of function.  [x]  Yes  []  No  Objective assessment of functional ability: Moderate functional limitations   Briefly describe symptoms: R shoulder pain s/p fall with 3-4-/5 strength in the R shoulder, inability to lift any significant weight overhead with R arm, difficulty doing housework, Engineer, manufacturing systems, laundry, any reaching overhead  How did symptoms start: fall 11/10/23  Average pain intensity:  Last 24 hours: 2-3  Past week: 2-3  How often does the pt experience symptoms? Frequently  How much have the symptoms interfered with usual daily activities? Quite a bit  How has condition changed since care began at this facility? Better  In general, how is the patients overall health? Fair  Onset date: 11/10/23   BACK PAIN (STarT Back Screening Tool) - (When applicable):  Has your back pain  spread down your leg(s) at sometime in the last 2 weeks? []  Yes   [x]  No Have you had pain in the shoulder or neck at sometime in the past 2 weeks? [x]  Yes   []   No Have you only walked short distances because of your back pain? []  Yes   [x]  No In the past 2 weeks, have you dressed more slowly than usual because of your back pain? []  Yes   [x]  No Do you think it is not really safe for person with a condition like yours to be physically active? [x]  Yes   []  No Have worrying thoughts been going through your mind a lot of the time? []  Yes   [x]  No Do you feel that your back pain is terrible and it is never going to get any better? []  Yes   [x]  No In general, have you stopped enjoying all the things you usually enjoy? []  Yes   [x]  No Overall, how bothersome has your back pain been in the last 2 weeks? []  Not at all   []  Slightly     [x]  Moderate   []  Very much     []  Extremely

## 2024-03-03 ENCOUNTER — Ambulatory Visit (INDEPENDENT_AMBULATORY_CARE_PROVIDER_SITE_OTHER): Payer: Medicare Other

## 2024-03-03 DIAGNOSIS — I480 Paroxysmal atrial fibrillation: Secondary | ICD-10-CM

## 2024-03-03 LAB — CUP PACEART REMOTE DEVICE CHECK
Battery Remaining Longevity: 67 mo
Battery Remaining Percentage: 71 %
Battery Voltage: 2.99 V
Brady Statistic AP VP Percent: 81 %
Brady Statistic AP VS Percent: 1 %
Brady Statistic AS VP Percent: 7.2 %
Brady Statistic AS VS Percent: 11 %
Brady Statistic RA Percent Paced: 42 %
Date Time Interrogation Session: 20250923043422
Implantable Lead Connection Status: 753985
Implantable Lead Connection Status: 753985
Implantable Lead Connection Status: 753985
Implantable Lead Implant Date: 20230626
Implantable Lead Implant Date: 20230626
Implantable Lead Implant Date: 20230626
Implantable Lead Location: 753858
Implantable Lead Location: 753859
Implantable Lead Location: 753860
Implantable Pulse Generator Implant Date: 20230626
Lead Channel Impedance Value: 400 Ohm
Lead Channel Impedance Value: 480 Ohm
Lead Channel Impedance Value: 860 Ohm
Lead Channel Pacing Threshold Amplitude: 0.5 V
Lead Channel Pacing Threshold Amplitude: 0.75 V
Lead Channel Pacing Threshold Amplitude: 1.125 V
Lead Channel Pacing Threshold Pulse Width: 0.5 ms
Lead Channel Pacing Threshold Pulse Width: 0.5 ms
Lead Channel Pacing Threshold Pulse Width: 0.5 ms
Lead Channel Sensing Intrinsic Amplitude: 1 mV
Lead Channel Sensing Intrinsic Amplitude: 12 mV
Lead Channel Setting Pacing Amplitude: 2 V
Lead Channel Setting Pacing Amplitude: 2.125
Lead Channel Setting Pacing Amplitude: 2.5 V
Lead Channel Setting Pacing Pulse Width: 0.5 ms
Lead Channel Setting Pacing Pulse Width: 0.5 ms
Lead Channel Setting Sensing Sensitivity: 2 mV
Pulse Gen Model: 3562
Pulse Gen Serial Number: 8071621

## 2024-03-04 ENCOUNTER — Ambulatory Visit

## 2024-03-04 ENCOUNTER — Ambulatory Visit: Payer: Self-pay | Admitting: Cardiology

## 2024-03-04 ENCOUNTER — Other Ambulatory Visit: Payer: Self-pay | Admitting: Cardiology

## 2024-03-04 ENCOUNTER — Other Ambulatory Visit: Payer: Self-pay | Admitting: Family Medicine

## 2024-03-04 ENCOUNTER — Other Ambulatory Visit: Payer: Self-pay | Admitting: Nurse Practitioner

## 2024-03-04 DIAGNOSIS — M25511 Pain in right shoulder: Secondary | ICD-10-CM

## 2024-03-04 DIAGNOSIS — E785 Hyperlipidemia, unspecified: Secondary | ICD-10-CM

## 2024-03-04 DIAGNOSIS — M6281 Muscle weakness (generalized): Secondary | ICD-10-CM

## 2024-03-04 DIAGNOSIS — R262 Difficulty in walking, not elsewhere classified: Secondary | ICD-10-CM

## 2024-03-04 DIAGNOSIS — R293 Abnormal posture: Secondary | ICD-10-CM

## 2024-03-04 DIAGNOSIS — I480 Paroxysmal atrial fibrillation: Secondary | ICD-10-CM

## 2024-03-04 DIAGNOSIS — G8929 Other chronic pain: Secondary | ICD-10-CM

## 2024-03-04 NOTE — Progress Notes (Signed)
 Remote PPM Transmission

## 2024-03-04 NOTE — Therapy (Signed)
 OUTPATIENT PHYSICAL THERAPY VISIT  Patient Name: Sheila Schmidt MRN: [id number] DOB:26-Jun-1935, 88 y.o., female Today's Date: 03/04/2024   END OF SESSION:  PT End of Session - 03/04/24 1549     Visit Number 9    Date for Recertification  04/18/24    Authorization Type U[employer] MCR    PT Start Time 1456    PT Stop Time 1544    PT Time Calculation (min) 48 min    Activity Tolerance Patient tolerated treatment well    Behavior During Therapy WFL for tasks assessed/performed              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   Dysrhythmia    Afib   Heart murmur    Hx of adenomatous colonic polyps    Hyperlipidemia    Presence of permanent cardiac pacemaker    Past Surgical History:  Procedure Laterality Date   ATRIAL FLUTTER ABLATION     BIV PACEMAKER INSERTION CRT-P N/A 12/04/2021   Procedure: BIV PACEMAKER INSERTION CRT-P;  Surgeon: Cindie Ole DASEN, MD;  Location: [facility];  Service: Cardiovascular;  Laterality: N/A;   BREAST BIOPSY Left 07/30/2022   US  LT BREAST BX W LOC DEV 1ST LESION IMG BX SPEC US  GUIDE 07/30/2022 [facility]   BREAST BIOPSY Left 09/10/2022   US  LT RADIOACTIVE SEED LOC 09/10/2022 [facility]   BREAST BIOPSY Left 09/10/2022   US  LT RADIOACTIVE SEED LOC 09/10/2022 [facility]   BREAST EXCISIONAL BIOPSY Left 2010   benign   BREAST LUMPECTOMY WITH RADIOACTIVE SEED AND SENTINEL LYMPH NODE BIOPSY Left 09/11/2022   Procedure: LEFT BREAST LUMPECTOMY WITH RADIOACTIVE SEED;  Surgeon: Vanderbilt Ned, MD;  Location: [facility] OR;  Service: General;  Laterality: Left;   CARDIOVERSION  05/2020   in [facility]   CARDIOVERSION N/A 06/21/2021   Procedure: CARDIOVERSION;  Surgeon: Barbaraann Darryle Ned, MD;  Location: [facility] ENDOSCOPY;  Service: Cardiovascular;  Laterality: N/A;   CARDIOVERSION N/A 03/26/2022    Procedure: CARDIOVERSION;  Surgeon: Pietro Redell RAMAN, MD;  Location: [facility] ENDOSCOPY;  Service: Cardiovascular;  Laterality: N/A;   CHOLECYSTECTOMY     CHOLECYSTECTOMY, LAPAROSCOPIC  '06   [name]   KNEE ARTHROSCOPY     Left '00 [place]/ Right '08   lumpectomy- remote     benign   RADIOACTIVE SEED GUIDED AXILLARY SENTINEL LYMPH NODE Left 09/11/2022   Procedure: RADIOACTIVE SEED GUIDED LEFT SENTINEL LYMPH NODE BIOPSY;  Surgeon: Vanderbilt Ned, MD;  Location: [facility] OR;  Service: General;  Laterality: Left;   TOOTH EXTRACTION     TOTAL KNEE ARTHROPLASTY  02/05/2011   left   Patient Active Problem List   Diagnosis Date Noted   Acute respiratory failure with hypoxia (HCC) 07/03/2023   Acute hypoxic respiratory failure (HCC) 07/03/2023   Hypertensive disorder 01/15/2023   Dyslipidemia 01/15/2023   Genetic testing 09/18/2022   Pacemaker Abbott device BiV 09/06/2022   Malignant neoplasm of upper-outer quadrant of left breast in female, estrogen receptor positive (HCC) 08/06/2022   Pure hypercholesterolemia 05/15/2022   Skin cancer 03/07/2022   Closed nondisplaced fracture of distal phalanx of right thumb 02/15/2022   Cancer (HCC) 05/25/2021   Left bundle branch block 04/12/2021   Dilated cardiomyopathy (HCC) 04/12/2021   Vasculitis 02/22/2021   Anxiety state 02/12/2021  Body fluid retention 02/12/2021   Cervical intraepithelial neoplasia 02/12/2021   Insomnia 02/12/2021   Pruritus of vulva 02/12/2021   Leg wound, right 10/03/2020   Bilateral primary osteoarthritis of knee 03/30/2019   Paroxysmal atrial fibrillation (HCC) 04/07/2015   Chronic anticoagulation 04/07/2015   Paroxysmal atrial flutter (HCC) 03/16/2015   Essential hypertension, benign 02/24/2014   Sciatica 12/08/2013   Hyperlipidemia 09/25/2013   Osteopenia 11/19/2012   Low back pain 01/26/2010   Carcinoma in situ of breast 09/04/2009   DEPRESSION, PROLONGED 09/04/2009   OA (osteoarthritis) of knee 09/04/2009   PERIPHERAL  EDEMA 09/04/2009   History of colonic polyps 09/04/2009    PCP: Watt Harlene BROCKS, MD   REFERRING PROVIDER: Watt Harlene BROCKS, MD   REFERRING DIAG: M25.511 (ICD-10-CM) - Acute pain of right shoulder   THERAPY DIAG:  Acute pain of right shoulder due to trauma  Muscle weakness (generalized)  Chronic left-sided thoracic back pain  Abnormal posture  Difficulty in walking, not elsewhere classified  RATIONALE FOR EVALUATION AND TREATMENT: Rehabilitation  ONSET DATE: 11/10/23  NEXT MD VISIT:  f/u PRN   SUBJECTIVE:                                                                                                                                                                                                         SUBJECTIVE STATEMENT:  Pt reports R knee bothering her more than anything, is supposed to try a new procedure but cannot recall what the name of it is.  INITIAL EVALUATION:  Patient is an 88 y/o female referred to PT for R shoulder pain and LBP following a fall 11/10/23 on vacation in the hotel.   States fell coming out of bathroom with floor transition from floor to carpet.  States hit R shoulder against the wall on the way down.  R shoulder pain resulted from the fall.   Patient reports significant bruising that has now resolved.    R shoulder xrays showed no fx's.  She has been using Salonpas with some relief.   LBP is actually more L sided thoracic in nature, and not a new issue.   The pain is intermittent and worse with making her bed in forward flexed reaching position and with vacuuming.    PAIN: Are you having pain? Yes: NPRS scale: R knee 8/10,  Pain location: R shoulder Pain description: subtle pinching pain Aggravating factors: elevating the R shoulder into full elevation Relieving factors: not elevating or lifting the R arm  PERTINENT HISTORY:  recurrent L breast CA with node dissection  4/24, dilated cardiomyopathy, Afib, pacemaker, HTN, anxiety, LBP, sciatica,  anxiety  PRECAUTIONS: ICD/Pacemaker, Lymphedema precautions LUE due to breast CA  RED FLAGS: None  HAND DOMINANCE: Right  WEIGHT BEARING RESTRICTIONS: No  FALLS:  Has patient fallen in last 6 months? Yes. Number of falls 1  LIVING ENVIRONMENT: Lives with: lives with their daughter Lives in: House/apartment Stairs: Yes: Internal: 12 steps; on right going up and External: 4 steps; on right going up Has following equipment at home: Single point cane and Walker - 2 wheeled  OCCUPATION: retired from Investment banker, corporate work  PLOF: Independent with community mobility with device  PATIENT GOALS: get my shoulder pain resolved, and walk better on the R knee (OA)   OBJECTIVE: (objective measures completed at initial evaluation unless otherwise dated)  DIAGNOSTIC FINDINGS:  CLINICAL DATA:  Right shoulder pain after fall 5 days ago.   EXAM: RIGHT SHOULDER - 2+ VIEW   COMPARISON:  None Available.   FINDINGS: There is no evidence of fracture or dislocation. There is no evidence of arthropathy or other focal bone abnormality. Soft tissues are unremarkable.   IMPRESSION: Negative.     Electronically Signed   By: Lynwood Landy Raddle M.D.   On: 11/15/2023 15:45  PATIENT SURVEYS:  Quick Dash   COGNITION: Overall cognitive status: Within functional limits for tasks assessed     SENSATION: WFL  POSTURE: rounded shoulders, forward head, and R scoliosis  UPPER EXTREMITY ROM:   Active ROM Right eval Left eval RUE 02/19/24  Shoulder flexion 160 165 160  Shoulder extension 70 70 70  Shoulder abduction 150 165 160  Shoulder adduction     Shoulder internal rotation 100 100 95  Shoulder external rotation 100 110 104  Elbow flexion     Elbow extension     Wrist flexion     Wrist extension     Wrist ulnar deviation     Wrist radial deviation     Wrist pronation     Wrist supination     (Blank rows = not tested)  UPPER EXTREMITY MMT:  MMT Right eval Left eval RUE 02/19/24 R   9/24  Shoulder flexion 4- p! 5 4- but mininimal pain 4  Shoulder extension 4- 4 4   Shoulder abduction 4- p! 5 4- min to no pain 4-  Shoulder adduction      Shoulder internal rotation 4 5 4+ 4+  Shoulder external rotation 3- p! 5 3 4-  Middle trapezius      Lower trapezius      Elbow flexion      Elbow extension      Wrist flexion      Wrist extension      Wrist ulnar deviation      Wrist radial deviation      Wrist pronation      Wrist supination      Grip strength (lbs)      (Blank rows = not tested) LUMBAR ROM:   Active  A/PROM  eval 02/19/24  Flexion 90% ROM 100%  Extension 90% ROM 100%  Right lateral flexion To knee jt Knee joint  Left lateral flexion Above knee jt Knee joint  Right rotation 90% 100%  Left rotation 75% 88%   (Blank rows = not tested)  LOWER EXTREMITY ROM:     Active  Right 02/19/24 Left 02/19/24  Hip flexion    Hip extension    Hip abduction    Hip adduction    Hip internal  rotation    Hip external rotation    Knee flexion 122 129  Knee extension 0 0  Ankle dorsiflexion    Ankle plantarflexion    Ankle inversion    Ankle eversion     (Blank rows = not tested)  LOWER EXTREMITY MMT:    MMT Right 02/19/24 Left 02/19/24  Hip flexion 4 4  Hip extension    Hip abduction 3+ 3+  Hip adduction    Hip internal rotation 4- 4-  Hip external rotation 4 4+  Knee flexion 4 4+  Knee extension 4; pain 5  Ankle dorsiflexion    Ankle plantarflexion    Ankle inversion    Ankle eversion     (Blank rows = not tested)   SHOULDER SPECIAL TESTS: Impingement tests: Neer impingement test: positive , Hawkins/Kennedy impingement test: negative, and Painful arc test: negative SLAP lesions: Biceps load test: positive  Instability tests: Apprehension test: negative Rotator cuff assessment: Drop arm test: negative, Empty can test: positive , Full can test: positive , and Gerber lift off test: negative Biceps assessment: Speed's test: positive   LUMBAR  SPECIAL TESTS:  Straight leg raise test: Negative   SLR RLE is 70 deg, LLE is 80 deg  FUNCTIONAL TESTING:  5X STS  = 21.35 sec  TUG = 17.53 sec-    JOINT MOBILITY TESTING:  hypermobile Bilateral shoulder joint accessory motions  PALPATION:  TTP over the R greater tuberosity, nontender over coracoid and posterior R shoulder;     TTP over L T-10 and T11 facets and paraspinals    TODAY'S TREATMENT:  03/04/24 Nustep L5x40min  Leg curls 10lb 2x15 BLE Leg ext 5lb 2x15 BLE Standing hip flex to abd to ext all in one motion x 5 BLE Standing march with trA set on handle bars x 10 B Mini squats UE support x 10  Seated B shoulder flexion 1lb 2x5 BUE Rows RTB seated 2x10  02/24/24 THERAPEUTIC EXERCISE: To improve strength.  Demonstration, verbal and tactile cues throughout for technique. Nu-step L5 x 6' BUE/BLE  Seated knee extension 5# x 3/10 RLE Seated knee flexion 10# x 3/10 BLE Standing hip flexor stretch 1' x 2 RLE Seated hip flexor stretch x 1'  RLE Supine SLR 2# x 3/10 RLE Supine Peanut ball rollouts x 30 BLE Supine peanut bridge x 2/10 BLE  Supine 0# press up x 20 RUE Supine 1# circles at 90 CW x 10; CCW x 10 SL ER 0# with PT assist for concentric, patient does eccentric x 2/10 RUE  NEUROMUSCULAR RE-EDUCATION: To improve balance. Gait with SPC with facilitation for balance, leaning to the L for appropriate assistance; going over objects and around objects for balance and encouraement to use cane at home.   02/19/24 THERAPEUTIC EXERCISE: To improve strength.  Demonstration, verbal and tactile cues throughout for technique. Nu-step L3 x 6' BUE/BLE  THERAPEUTIC ACTIVITIES: To improve functional performance.  Demonstration, verbal and tactile cues throughout for technique. Recheck of R shoulder strength and ROM (see tables) Supine ER/IR 3# x 2/15 RUE Supine press up 0# x 2/10 RUE Circles at 90 deg flexion 3# x 15 CW; x15 CCW RUE w/ PT guidance Supine shoulder abd slides on  mat to 90 deg x 2/10 RUE Strength and ROM checked BUE, BLE  HP to R shoulder and R knee in supine x 15'   PATIENT EDUCATION:  Education details: added mini squat, clock reach, march   Person educated: Patient Education method: Programmer, multimedia, Facilities manager, Verbal  cues, Tactile cues, and Handouts Education comprehension: verbalized understanding, verbal cues required, tactile cues required, and needs further education  HOME EXERCISE PROGRAM: Access Code: [email or web address] URL: [email or web address] Date: 12/11/2023 Prepared by: Garnette Montclair  Exercises - Sidelying Shoulder Horizontal Abduction  - 1 x daily - 7 x weekly - 1 sets - 20 reps - Supine Shoulder Circles  - 1 x daily - 7 x weekly - 1 sets - 20 reps - Seated Shoulder External Rotation AAROM with Dowel  - 1 x daily - 7 x weekly - 1 sets - 20 reps - Seated Single Arm Shoulder External Rotation with Towel  - 1 x daily - 7 x weekly - 1 sets - 20 reps - Seated Shoulder External Rotation AROM in Supported Abduction  - 1 x daily - 7 x weekly - 1 sets - 20 reps   ASSESSMENT:  CLINICAL IMPRESSION:  Pt responded well treatment. R knee pain was the biggest complaint today, so we focused on knee specific strengthening with good response. We were able to add more CKC strengthening for R R knee. Shoulder strength has improved but she lacks muscular endurance with small weights. Would recommend continued focus on LE strength but also work on shoulder strength as well, for overall strength building.  Sheila Schmidt is a 88 y.o. female who was referred to physical therapy for evaluation and treatment for R shoulder pain and L lower thoracic pain.    Patient reports onset of R shoulder pain beginning approximately 1 month ago s/p fall. Pain is worse with reaching overhead, lifting any weight with the RUE past shoulder level, and haircare.  Patient has deficits in lumbar flexibility, R shoulder strength, abnormal posture, and TTP with  abnormal muscle tension R shoulder and L thoracic spine/BLE which are interfering with ADLs and are impacting quality of life.  On QuickDASH patient scored 20.5/100 demonstrating 20.5% disability.  Sheila Schmidt will benefit from skilled PT to address above deficits to improve mobility and activity tolerance with decreased pain interference.   OBJECTIVE IMPAIRMENTS: Abnormal gait, decreased balance, decreased strength, and postural dysfunction.   ACTIVITY LIMITATIONS: lifting, stairs, reach over head, and locomotion level  PARTICIPATION LIMITATIONS: cleaning, laundry, and making bed, vacuuming  PERSONAL FACTORS: Age and 1-2 comorbidities: recurrent L breast CA with node dissection 4/24, dilated cardiomyopathy, Afib, pacemaker, HTN, anxiety, LBP, anxiety are also affecting patient's functional outcome.   REHAB POTENTIAL: Good  CLINICAL DECISION MAKING: Evolving/moderate complexity  EVALUATION COMPLEXITY: Moderate   GOALS: Goals reviewed with patient? Yes  SHORT TERM GOALS: Target date: 03/21/24  Patient will be independent with HEP for R knee pain.  Baseline: will give HEP next visit since we have not been seeing her for knee pain Goal status: INITIAL  2.  Patient will report 25% improvement in R knee pain to improve QOL.   Baseline:  R knee is 5/10 Goal status: IN PROGRESS   LONG TERM GOALS: Target date: 04/18/24  Patient will be independent with ongoing/advanced HEP for self-management at home.  Baseline: has no advanced HEP for R knee;  still progressing with R shoulder Goal status: IN PROGRESS  2.  Patient will report 50-75% improvement in R shoulder and L thoracic pain to improve QOL.  Baseline: R knee 5/10;  R shoulder 3/10 with lifting weight overhead Goal status: IN PROGRESS  3.  Patient to demonstrate improved upright posture with posterior shoulder girdle engaged to promote improved glenohumeral joint mobility. Baseline: forward head/rounded shoulders Goal status:  INITIAL  4.  Patient to improve L thoracic sidebending and rotation to equal bilaterally/90% without pain provocation to allow for increased ease of ADLs.  Baseline: Refer to above lumbar ROM table Goal status: INITIAL  5.  Patient will demonstrate improved R shoulder strength to >/= 4+/5 for functional UE use. Baseline: Refer to above UE MMT table Goal status: IN PROGRESS- 03/04/24 see table above  6  Patient will report </= 5%% on QuickDASH (MCID = 14%) to demonstrate improved functional ability.  Baseline: 20.5 02/24/24:      36.4 / 100 = 36.4 % Goal status: IN PROGRESS  7.  Patient will report no falls and improve on functional strength/balance testing (TBD next visit if able) to WNL for agegroup to reduce fall risk   Baseline: TBD  Goal status: INITIAL    8.  Patient will demonstrate 5/5 R knee quad strength for improved function and improved QOL   Baseline:  4/5   Goal Status:  INITIAL   9.  Patient will report 50-75% subjective improvement in her R knee pain   Baseline:  02/19/24:  6/10 R knee   Goal status:  IN PROGRESS   10.  Patient will report >/= 48/80 on LEFS (MCID = 9 pts ) for improved ability to ADL and improved QOL   Baseline:  38/80   Goal status:  INITIAL   PLAN:  PT FREQUENCY: 1x/week  PT DURATION: 8 weeks  PLANNED INTERVENTIONS: 97164- PT Re-evaluation, 97750- Physical Performance Testing, 97110-Therapeutic exercises, 97530- Therapeutic activity, [id number]- Neuromuscular re-education, 97535- Self Care, [id number]- Manual therapy, [id number]- Gait training, [id number]- Electrical stimulation (unattended), [id number]- Ultrasound, [id number]- Traction (mechanical), [id number]- Ionotophoresis 4mg /ml Dexamethasone , 79439 (1-2 muscles), 20561 (3+ muscles)- Dry Needling, Patient/Family education, Balance training, Stair training, Taping, Joint mobilization, Spinal mobilization, Cryotherapy, Moist heat, and Biofeedback  PLAN FOR NEXT SESSION: Continue to work on open and gentle closed chain R knee  strengthening;  continue to address R shoulder weakness as well.  Add RDL RLE  Sol LITTIE Gaskins, PTA 03/04/2024, 3:53 PM  Date of referral: 11/20/23 [place] provider: Watt Harlene BROCKS, MD/ Referring diagnosis? M25.511 (ICD-10-CM) - Acute pain of right shoulder  Treatment diagnosis? (if different than referring diagnosis)  Acute pain of right shoulder due to trauma  Muscle weakness (generalized)  Chronic left-sided thoracic back pain  Abnormal posture  Difficulty in walking, not elsewhere classified  What was this (referring dx) caused by? Felton Hawks of Condition: Initial Onset (within last 3 months)   Laterality: Rt  Current Functional Measure Score: DASH 20.5  Objective measurements identify impairments when they are compared to normal values, the uninvolved extremity, and prior level of function.  [x]  Yes  []  No  Objective assessment of functional ability: Moderate functional limitations   Briefly describe symptoms: R shoulder pain s/p fall with 3-4-/5 strength in the R shoulder, inability to lift any significant weight overhead with R arm, difficulty doing housework, Engineer, manufacturing systems, laundry, any reaching overhead  How did symptoms start: fall 11/10/23  Average pain intensity:  Last 24 hours: 2-3  Past week: 2-3  How often does the pt experience symptoms? Frequently  How much have the symptoms interfered with usual daily activities? Quite a bit  How has condition changed since care began at this facility? Better  In general, how is the patients overall health? Fair  Onset date: 11/10/23   BACK PAIN (STarT Back Screening Tool) - (When applicable):  Has your back pain spread down your leg(s) at sometime in the  last 2 weeks? []  Yes   [x]  No Have you had pain in the shoulder or neck at sometime in the past 2 weeks? [x]  Yes   []  No Have you only walked short distances because of your back pain? []  Yes   [x]  No In the past 2 weeks, have you dressed more slowly than  usual because of your back pain? []  Yes   [x]  No Do you think it is not really safe for person with a condition like yours to be physically active? [x]  Yes   []  No Have worrying thoughts been going through your mind a lot of the time? []  Yes   [x]  No Do you feel that your back pain is terrible and it is never going to get any better? []  Yes   [x]  No In general, have you stopped enjoying all the things you usually enjoy? []  Yes   [x]  No Overall, how bothersome has your back pain been in the last 2 weeks? []  Not at all   []  Slightly     [x]  Moderate   []  Very much     []  Extremely

## 2024-03-06 NOTE — Patient Instructions (Addendum)
 It was good to see you again today!  I will be in touch with your labs Recommend flu and covid booster at your convenience  Recommend a tetanus booster at the pharmacy   Please stop by the imaging center on the ground floor to set up your bone density and have chest and back x-rays  Ok to use tylenol  as needed for pain- but no more than  1 drink daily if you are using tylenol  Lidocaine  patches might also help

## 2024-03-06 NOTE — Progress Notes (Addendum)
 [facility] [employer] [facility] [employer] [address], [address] [place], [place] [address] [phone]  Date:  03/09/2024   Name:  Sheila Schmidt   DOB:  04/30/1936   MRN:  [id number]  PCP:  Watt Harlene BROCKS, MD    Chief Complaint: Follow-up (L Mid back to lower back My back hurts more than normal, if I try to vacuum I have to sit down real quick/R knee pain /Sneezing, wheezing and coughing onset I've had it, its my allergies )   History of Present Illness:  CHANEE HENRICKSON is a 88 y.o. very pleasant female patient who presents with the following:  Patient seen today for periodic follow-up.  I saw her most recently in June.  At that time she had recently fallen and hurt her back.  She is accompanied today by her daughter and primary caregiver Devere  History of nonischemic cardiomyopathy with pacemaker placement in June 2023,also history of atrial fibrillation, hyperlipidemia, chronic knee pain from arthritis, osteopenia, breast cancer LEFT   She was found to have recurrent breast cancer 2024, had a lumpectomy in early April 2024 with a limited node dissection   She has been doing physical therapy to work on her shoulder injury from her recent fall as well as improving her gait and overall strength. She saw her oncologist, Dr.Feng most recently in May.  She is using tamoxifen , they plan for 5 years of treatment They also did a bone scan in May of this year which was negative  Recommend COVID booster this fall Flu shot- she would like to do later  Due for tetanus booster per pharmacy We are not sure if she received her second dose of Shingrix-consider getting another dose DEXA scan 3/23, can update.  Scan showed osteopenia Mammogram 2/25, benign ?  Follow-up with cardiology- she has an appt coming up in December   Amiodarone  Lipitor Entresto  Lasix  20 by mouth daily as needed Xarelto  20 daily Tamoxifen  Effexor  XR 37.5  Discussed the use of AI  scribe software for clinical note transcription with the patient, who gave verbal consent to proceed.  History of Present Illness KIMMIE BERGGREN is a 88 year old female who presents with back pain and constipation. She is accompanied by her daughter, who is her primary caregiver.  She has been experiencing back pain primarily on the left side, which worsens with activities such as vacuuming and folding clothes. The pain does not radiate down her leg. She has not had any recent falls since June, when she fell in [place]. She is currently not taking any medication for her back pain.  She will occasionally take Tylenol .  She avoids NSAIDs due to renal insufficiency and also Xarelto  use  She reports constipation following a meal that included shrimp, which she suspects may have caused food poisoning. She has not experienced diarrhea or vomiting but feels discomfort in her stomach. She has not been taking any medication for constipation.  She has been experiencing sneezing, wheezing, and coughing, particularly in the morning and when changing positions. These symptoms have been present since the allergy season began in August. She does not take any allergy medication because she is unsure if it is safe with her current medications.  She consumes alcohol regularly, typically half to one small bottle of Prosecco daily, and occasionally two glasses when dining out.    Patient Active Problem List   Diagnosis Date Noted   Acute  respiratory failure with hypoxia (HCC) 07/03/2023   Acute hypoxic respiratory failure (HCC) 07/03/2023   Hypertensive disorder 01/15/2023   Dyslipidemia 01/15/2023   Genetic testing 09/18/2022   Pacemaker Abbott device BiV 09/06/2022   Malignant neoplasm of upper-outer quadrant of left breast in female, estrogen receptor positive (HCC) 08/06/2022   Pure hypercholesterolemia 05/15/2022   Skin cancer 03/07/2022   Closed nondisplaced fracture of distal phalanx  of right thumb 02/15/2022   Cancer (HCC) 05/25/2021   Left bundle branch block 04/12/2021   Dilated cardiomyopathy (HCC) 04/12/2021   Vasculitis 02/22/2021   Anxiety state 02/12/2021   Body fluid retention 02/12/2021   Cervical intraepithelial neoplasia 02/12/2021   Insomnia 02/12/2021   Pruritus of vulva 02/12/2021   Leg wound, right 10/03/2020   Bilateral primary osteoarthritis of knee 03/30/2019   Paroxysmal atrial fibrillation (HCC) 04/07/2015   Chronic anticoagulation 04/07/2015   Paroxysmal atrial flutter (HCC) 03/16/2015   Essential hypertension, benign 02/24/2014   Sciatica 12/08/2013   Hyperlipidemia 09/25/2013   Osteopenia 11/19/2012   Low back pain 01/26/2010   Carcinoma in situ of breast 09/04/2009   DEPRESSION, PROLONGED 09/04/2009   OA (osteoarthritis) of knee 09/04/2009   PERIPHERAL EDEMA 09/04/2009   History of colonic polyps 09/04/2009    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   Dysrhythmia    Afib   Heart murmur    Hx of adenomatous colonic polyps    Hyperlipidemia    Presence of permanent cardiac pacemaker     Past Surgical History:  Procedure Laterality Date   ATRIAL FLUTTER ABLATION     BIV PACEMAKER INSERTION CRT-P N/A 12/04/2021   Procedure: BIV PACEMAKER INSERTION CRT-P;  Surgeon: Cindie Ole DASEN, MD;  Location: [facility];  Service: Cardiovascular;  Laterality: N/A;   BREAST BIOPSY Left 07/30/2022   US  LT BREAST BX W LOC DEV 1ST LESION IMG BX SPEC US  GUIDE 07/30/2022 [facility]   BREAST BIOPSY Left 09/10/2022   US  LT RADIOACTIVE SEED LOC 09/10/2022 [facility]   BREAST BIOPSY Left 09/10/2022   US  LT RADIOACTIVE SEED LOC 09/10/2022 [facility]   BREAST EXCISIONAL BIOPSY Left 2010   benign   BREAST LUMPECTOMY WITH RADIOACTIVE SEED AND SENTINEL LYMPH NODE BIOPSY Left 09/11/2022    Procedure: LEFT BREAST LUMPECTOMY WITH RADIOACTIVE SEED;  Surgeon: Vanderbilt Ned, MD;  Location: [facility] OR;  Service: General;  Laterality: Left;   CARDIOVERSION  05/2020   in [facility]   CARDIOVERSION N/A 06/21/2021   Procedure: CARDIOVERSION;  Surgeon: Barbaraann Darryle Ned, MD;  Location: [facility] ENDOSCOPY;  Service: Cardiovascular;  Laterality: N/A;   CARDIOVERSION N/A 03/26/2022   Procedure: CARDIOVERSION;  Surgeon: Pietro Redell RAMAN, MD;  Location: [facility] ENDOSCOPY;  Service: Cardiovascular;  Laterality: N/A;   CHOLECYSTECTOMY     CHOLECYSTECTOMY, LAPAROSCOPIC  '06   [name]   KNEE ARTHROSCOPY     Left '00 [place]/ Right '08   lumpectomy- remote     benign   RADIOACTIVE SEED GUIDED AXILLARY SENTINEL LYMPH NODE Left 09/11/2022   Procedure: RADIOACTIVE SEED GUIDED LEFT SENTINEL LYMPH NODE BIOPSY;  Surgeon: Vanderbilt Ned, MD;  Location: [facility] OR;  Service: General;  Laterality: Left;   TOOTH EXTRACTION     TOTAL KNEE ARTHROPLASTY  02/05/2011   left    Social History   Tobacco Use  Smoking status: Never    Passive exposure: Never   Smokeless tobacco: Never  Vaping Use   Vaping status: Never Used  Substance Use Topics   Alcohol use: Yes    Comment: 1 glass per day   Drug use: No    Family History  Problem Relation Age of Onset   Diabetes Mother        Deceased in 26s   Heart disease Mother        cad/MI-fatal   Heart attack Mother    Liver cancer Father 59   Heart disease Sister    Breast cancer Neg Hx    Colon cancer Neg Hx     No Known Allergies  Medication list has been reviewed and updated.  Current Outpatient Medications on File Prior to Visit  Medication Sig Dispense Refill   acetaminophen  (TYLENOL ) 500 MG tablet Take 500-1,000 mg by mouth as needed for moderate pain (pain score 4-6).     amiodarone  (PACERONE ) 100 MG tablet TAKE 1 TABLET BY MOUTH DAILY 90 tablet 0   atorvastatin  (LIPITOR) 20 MG tablet Take 1 tablet (20 mg total) by mouth daily. 90 tablet 0   calcium  carbonate  (TUMS - DOSED IN MG ELEMENTAL CALCIUM ) 500 MG chewable tablet Chew 1 tablet by mouth as needed for indigestion or heartburn.     Calcium  Carbonate-Vit D-Min (CALTRATE 600+D PLUS MINERALS) 600-800 MG-UNIT CHEW Chew 1 each by mouth daily.     Cholecalciferol 50 MCG (2000 UT) TABS Take 2,000 Units by mouth daily.     ENTRESTO  24-26 MG TAKE 1 TABLET BY MOUTH TWICE  DAILY 180 tablet 3   famotidine  (PEPCID ) 40 MG tablet Take 40 mg by mouth daily as needed for heartburn or indigestion.     furosemide  (LASIX ) 20 MG tablet Take 20 mg by mouth as needed for fluid or edema.     guaiFENesin -dextromethorphan  (ROBITUSSIN DM) 100-10 MG/5ML syrup Take 10 mLs by mouth every 6 (six) hours as needed for cough. 118 mL 0   lidocaine  (LIDODERM ) 5 % Place 1 patch onto the skin daily. Remove & Discard patch within 12 hours or as directed by MD 15 patch 0   melatonin 5 MG TABS Take 1.25 mg by mouth at bedtime as needed (sleep).     nystatin  (MYCOSTATIN /NYSTOP ) powder Apply 1 Application topically 3 (three) times daily. TO skin rash at axilla or below breasts 60 g 1   rivaroxaban  (XARELTO ) 20 MG TABS tablet Take 1 tablet (20 mg total) by mouth daily with supper. 90 tablet 0   tamoxifen  (NOLVADEX ) 20 MG tablet TAKE 1 TABLET BY MOUTH DAILY 90 tablet 3   venlafaxine  XR (EFFEXOR -XR) 37.5 MG 24 hr capsule TAKE 1 CAPSULE BY MOUTH DAILY  WITH BREAKFAST 90 capsule 3   vitamin B-12 (CYANOCOBALAMIN ) 100 MCG tablet Take 100 mcg by mouth daily.     No current facility-administered medications on file prior to visit.    Review of Systems:  As per HPI- otherwise negative. BP Readings from Last 3 Encounters:  03/09/24 108/64  12/06/23 110/60  11/20/23 106/72      Physical Examination: Vitals:   03/09/24 1347  BP: 108/64  Pulse: 69  Temp: 98 F (36.7 C)  SpO2: 95%   Vitals:   03/09/24 1347  Weight: 178 lb 3.2 oz (80.8 kg)  Height: 5' 2 (1.575 m)   Body mass index is 32.59 kg/m. Ideal Body Weight: Weight in (lb)  to have BMI = 25: 136.4  GEN: no acute distress.  Elderly lady, mild obesity.  Looks well in her normal self HEENT: Atraumatic, Normocephalic.  Ears and Nose: No external deformity. CV: RRR, No M/G/R. No JVD. No thrill. No extra heart sounds. PULM: CTA B, no wheezes, crackles, rhonchi. No retractions. No resp. distress. No accessory muscle use. ABD: S, NT, ND EXTR: No c/c/e PSYCH: Normally interactive. Conversant.  She indicates the left lumbar musculature as the site of her tenderness  Results for orders placed or performed in visit on 03/09/24  POC COVID-19 BinaxNow   Collection Time: 03/09/24  2:45 PM  Result Value Ref Range   SARS Coronavirus 2 Ag Negative Negative    Assessment and Plan: Essential hypertension, benign  Paroxysmal atrial fibrillation (HCC)  Carcinoma in situ of left breast, unspecified type  Chronic anticoagulation  Dyslipidemia - Plan: Lipid panel  Screening for diabetes mellitus - Plan: Hemoglobin A1c  Acute cough - Plan: DG Chest 2 View, POC COVID-19 BinaxNow, CBC  Chronic bilateral low back pain without sciatica - Plan: DG Lumbar Spine Complete  Estrogen deficiency - Plan: DG Bone Density  Assessment & Plan Chronic left-sided low back pain Chronic low back pain, primarily on the left side, exacerbated by activities. No leg radiation. No recent imaging.  Consider compression fracture - Order x-ray of the lower back. Update bone density - Recommend lidocaine  patches for pain relief. - Advise acetaminophen  500-1,000 mg as needed, especially before and after physical therapy. - Caution against NSAIDs due to potential interactions. - Limit alcohol to one drink per day with acetaminophen .  Right shoulder pain Managed with physical therapy.  Constipation No fever, vomiting, or diarrhea. - Recommend Miralax  for relief.  Wheezing and cough.  COVID-negative today Possibly related to seasonal allergies. Symptoms include sneezing and wheezing. No  current allergy medication. - Recommend trial of antihistamine such as Zyrtec.  Signed Harlene Schroeder, MD  Addendum 9/30, received labs as below.  Message to patient  Results for orders placed or performed in visit on 03/09/24  Lipid panel   Collection Time: 03/09/24  2:41 PM  Result Value Ref Range   Cholesterol 156 0 - 200 mg/dL   Triglycerides 22.9 0.0 - 149.0 mg/dL   HDL 41.39 >60.99 mg/dL   VLDL 84.5 0.0 - 59.9 mg/dL   LDL Cholesterol 82 0 - 99 mg/dL   Total CHOL/HDL Ratio 3    NonHDL 97.54   Hemoglobin A1c   Collection Time: 03/09/24  2:41 PM  Result Value Ref Range   Hgb A1c MFr Bld 5.2 4.6 - 6.5 %  CBC   Collection Time: 03/09/24  2:41 PM  Result Value Ref Range   WBC 8.8 4.0 - 10.5 K/uL   RBC 4.37 3.87 - 5.11 Mil/uL   Platelets 165.0 150.0 - 400.0 K/uL   Hemoglobin 14.7 12.0 - 15.0 g/dL   HCT 55.5 63.9 - 53.9 %   MCV 101.6 (H) 78.0 - 100.0 fl   MCHC 33.1 30.0 - 36.0 g/dL   RDW 86.2 88.4 - 84.4 %  POC COVID-19 BinaxNow   Collection Time: 03/09/24  2:45 PM  Result Value Ref Range   SARS Coronavirus 2 Ag Negative Negative    "

## 2024-03-09 ENCOUNTER — Ambulatory Visit (HOSPITAL_BASED_OUTPATIENT_CLINIC_OR_DEPARTMENT_OTHER)
Admission: RE | Admit: 2024-03-09 | Discharge: 2024-03-09 | Disposition: A | Discharge status: Home / Self Care | Source: Ambulatory Visit | Attending: Family Medicine | Admitting: Family Medicine

## 2024-03-09 ENCOUNTER — Encounter: Payer: Self-pay | Admitting: Family Medicine

## 2024-03-09 ENCOUNTER — Ambulatory Visit: Admitting: Family Medicine

## 2024-03-09 VITALS — BP 108/64 | HR 69 | Temp 98.0°F | Ht 62.0 in | Wt 178.2 lb

## 2024-03-09 DIAGNOSIS — M545 Low back pain, unspecified: Secondary | ICD-10-CM | POA: Diagnosis present

## 2024-03-09 DIAGNOSIS — I10 Essential (primary) hypertension: Secondary | ICD-10-CM | POA: Diagnosis not present

## 2024-03-09 DIAGNOSIS — Z131 Encounter for screening for diabetes mellitus: Secondary | ICD-10-CM

## 2024-03-09 DIAGNOSIS — R051 Acute cough: Secondary | ICD-10-CM | POA: Insufficient documentation

## 2024-03-09 DIAGNOSIS — E785 Hyperlipidemia, unspecified: Secondary | ICD-10-CM | POA: Diagnosis not present

## 2024-03-09 DIAGNOSIS — I420 Dilated cardiomyopathy: Secondary | ICD-10-CM | POA: Diagnosis not present

## 2024-03-09 DIAGNOSIS — G8929 Other chronic pain: Secondary | ICD-10-CM | POA: Insufficient documentation

## 2024-03-09 DIAGNOSIS — I480 Paroxysmal atrial fibrillation: Secondary | ICD-10-CM

## 2024-03-09 DIAGNOSIS — Z7901 Long term (current) use of anticoagulants: Secondary | ICD-10-CM

## 2024-03-09 DIAGNOSIS — D0592 Unspecified type of carcinoma in situ of left breast: Secondary | ICD-10-CM

## 2024-03-09 DIAGNOSIS — E2839 Other primary ovarian failure: Secondary | ICD-10-CM

## 2024-03-09 LAB — POC COVID19 BINAXNOW: SARS Coronavirus 2 Ag: NEGATIVE

## 2024-03-10 ENCOUNTER — Encounter: Payer: Self-pay | Admitting: Family Medicine

## 2024-03-10 LAB — CBC
HCT: 44.4 % (ref 36.0–46.0)
Hemoglobin: 14.7 g/dL (ref 12.0–15.0)
MCHC: 33.1 g/dL (ref 30.0–36.0)
MCV: 101.6 fl — ABNORMAL HIGH (ref 78.0–100.0)
Platelets: 165 K/uL (ref 150.0–400.0)
RBC: 4.37 Mil/uL (ref 3.87–5.11)
RDW: 13.7 % (ref 11.5–15.5)
WBC: 8.8 K/uL (ref 4.0–10.5)

## 2024-03-10 LAB — LIPID PANEL
Cholesterol: 156 mg/dL (ref 0–200)
HDL: 58.6 mg/dL (ref >39.00)
LDL Cholesterol: 82 mg/dL (ref 0–99)
NonHDL: 97.54
Total CHOL/HDL Ratio: 3
Triglycerides: 77 mg/dL (ref 0.0–149.0)
VLDL: 15.4 mg/dL (ref 0.0–40.0)

## 2024-03-10 LAB — HEMOGLOBIN A1C: Hgb A1c MFr Bld: 5.2 % (ref 4.6–6.5)

## 2024-03-11 ENCOUNTER — Ambulatory Visit: Attending: Family Medicine | Admitting: Rehabilitation

## 2024-03-11 ENCOUNTER — Encounter: Payer: Self-pay | Admitting: Rehabilitation

## 2024-03-11 DIAGNOSIS — G8929 Other chronic pain: Secondary | ICD-10-CM | POA: Insufficient documentation

## 2024-03-11 DIAGNOSIS — M546 Pain in thoracic spine: Secondary | ICD-10-CM | POA: Diagnosis present

## 2024-03-11 DIAGNOSIS — M25561 Pain in right knee: Secondary | ICD-10-CM | POA: Diagnosis present

## 2024-03-11 DIAGNOSIS — R293 Abnormal posture: Secondary | ICD-10-CM | POA: Diagnosis present

## 2024-03-11 DIAGNOSIS — M25511 Pain in right shoulder: Secondary | ICD-10-CM | POA: Insufficient documentation

## 2024-03-11 DIAGNOSIS — M6281 Muscle weakness (generalized): Secondary | ICD-10-CM | POA: Insufficient documentation

## 2024-03-11 DIAGNOSIS — R262 Difficulty in walking, not elsewhere classified: Secondary | ICD-10-CM | POA: Insufficient documentation

## 2024-03-11 DIAGNOSIS — G8911 Acute pain due to trauma: Secondary | ICD-10-CM | POA: Diagnosis present

## 2024-03-11 NOTE — Therapy (Addendum)
 "   OUTPATIENT PHYSICAL THERAPY VISIT / DC SUMMARY   Progress Note Reporting Period 12/11/23 to 03/11/24  See note below for Objective Data and Assessment of Progress/Goals.       Patient Name: Sheila Schmidt MRN: [id number] DOB:1935/11/12, 88 y.o., female Today's Date: 03/11/2024   END OF SESSION:  PT End of Session - 03/11/24 1459     Visit Number 10    Date for Recertification  04/18/24    Authorization Type U[employer] MCR    PT Start Time 1455   10' late for appointment   PT Stop Time 1555    PT Time Calculation (min) 60 min    Activity Tolerance Patient tolerated treatment well    Behavior During Therapy WFL for tasks assessed/performed              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   Dysrhythmia    Afib   Heart murmur    Hx of adenomatous colonic polyps    Hyperlipidemia    Presence of permanent cardiac pacemaker    Past Surgical History:  Procedure Laterality Date   ATRIAL FLUTTER ABLATION     BIV PACEMAKER INSERTION CRT-P N/A 12/04/2021   Procedure: BIV PACEMAKER INSERTION CRT-P;  Surgeon: Cindie Ole DASEN, MD;  Location: [facility];  Service: Cardiovascular;  Laterality: N/A;   BREAST BIOPSY Left 07/30/2022   US  LT BREAST BX W LOC DEV 1ST LESION IMG BX SPEC US  GUIDE 07/30/2022 [facility]   BREAST BIOPSY Left 09/10/2022   US  LT RADIOACTIVE SEED LOC 09/10/2022 [facility]   BREAST BIOPSY Left 09/10/2022   US  LT RADIOACTIVE SEED LOC 09/10/2022 [facility]   BREAST EXCISIONAL BIOPSY Left 2010   benign   BREAST LUMPECTOMY WITH RADIOACTIVE SEED AND SENTINEL LYMPH NODE BIOPSY Left 09/11/2022   Procedure: LEFT BREAST LUMPECTOMY WITH RADIOACTIVE SEED;  Surgeon: Vanderbilt Ned, MD;  Location: [facility] OR;  Service: General;  Laterality: Left;   CARDIOVERSION  05/2020   in [facility]   CARDIOVERSION N/A 06/21/2021    Procedure: CARDIOVERSION;  Surgeon: Barbaraann Darryle Ned, MD;  Location: [facility] ENDOSCOPY;  Service: Cardiovascular;  Laterality: N/A;   CARDIOVERSION N/A 03/26/2022   Procedure: CARDIOVERSION;  Surgeon: Pietro Redell RAMAN, MD;  Location: [facility] ENDOSCOPY;  Service: Cardiovascular;  Laterality: N/A;   CHOLECYSTECTOMY     CHOLECYSTECTOMY, LAPAROSCOPIC  '06   [name]   KNEE ARTHROSCOPY     Left '00 [place]/ Right '08   lumpectomy- remote     benign   RADIOACTIVE SEED GUIDED AXILLARY SENTINEL LYMPH NODE Left 09/11/2022   Procedure: RADIOACTIVE SEED GUIDED LEFT SENTINEL LYMPH NODE BIOPSY;  Surgeon: Vanderbilt Ned, MD;  Location: [facility] OR;  Service: General;  Laterality: Left;   TOOTH EXTRACTION     TOTAL KNEE ARTHROPLASTY  02/05/2011   left   Patient Active Problem List   Diagnosis Date Noted   Acute respiratory failure with hypoxia (HCC) 07/03/2023   Acute hypoxic respiratory failure (HCC) 07/03/2023   Hypertensive disorder 01/15/2023   Dyslipidemia 01/15/2023   Genetic testing 09/18/2022   Pacemaker [employer] device BiV 09/06/2022   Malignant neoplasm of upper-outer quadrant of left breast in female, estrogen receptor positive (HCC) 08/06/2022   Pure hypercholesterolemia 05/15/2022   Skin cancer 03/07/2022   Closed nondisplaced fracture  of distal phalanx of right thumb 02/15/2022   Cancer (HCC) 05/25/2021   Left bundle branch block 04/12/2021   Dilated cardiomyopathy (HCC) 04/12/2021   Vasculitis 02/22/2021   Anxiety state 02/12/2021   Body fluid retention 02/12/2021   Cervical intraepithelial neoplasia 02/12/2021   Insomnia 02/12/2021   Pruritus of vulva 02/12/2021   Leg wound, right 10/03/2020   Bilateral primary osteoarthritis of knee 03/30/2019   Paroxysmal atrial fibrillation (HCC) 04/07/2015   Chronic anticoagulation 04/07/2015   Paroxysmal atrial flutter (HCC) 03/16/2015   Essential hypertension, benign 02/24/2014   Sciatica 12/08/2013   Hyperlipidemia 09/25/2013   Osteopenia  11/19/2012   Low back pain 01/26/2010   Carcinoma in situ of breast 09/04/2009   DEPRESSION, PROLONGED 09/04/2009   OA (osteoarthritis) of knee 09/04/2009   PERIPHERAL EDEMA 09/04/2009   History of colonic polyps 09/04/2009    PCP: Copland, Harlene BROCKS, MD   REFERRING PROVIDER: Watt Harlene BROCKS, MD   REFERRING DIAG: M25.511 (ICD-10-CM) - Acute pain of right shoulder   THERAPY DIAG:  Acute pain of right shoulder due to trauma  Muscle weakness (generalized)  Chronic left-sided thoracic back pain  Abnormal posture  Difficulty in walking, not elsewhere classified  Chronic pain of right knee  RATIONALE FOR EVALUATION AND TREATMENT: Rehabilitation  ONSET DATE: 11/10/23  NEXT MD VISIT:  f/u PRN   SUBJECTIVE:                                                                                                                                                                                                         SUBJECTIVE STATEMENT:  Patient reports her knee still bothers her but wants to address her L sided low back pain today.   She was doing better with this, but did some vacuuming and it is now hurting again.   She states the R knee always hurts and she is supposed to see Dr claudene at [facility] in 3 weeks to discuss options for pain relief.    INITIAL EVALUATION:  Patient is an 88 y/o female referred to PT for R shoulder pain and LBP following a fall 11/10/23 on vacation in the hotel.   States fell coming out of bathroom with floor transition from floor to carpet.  States hit R shoulder against the wall on the way down.  R shoulder pain resulted from the fall.   Patient reports significant bruising that has now resolved.    R shoulder xrays showed no fx's.  She has been using Salonpas with some relief.   LBP is actually more  L sided thoracic in nature, and not a new issue.   The pain is intermittent and worse with making her bed in forward flexed reaching position and with vacuuming.     PAIN: Are you having pain? Yes: NPRS scale: R knee 8/10,  Pain location: R shoulder Pain description: subtle pinching pain Aggravating factors: elevating the R shoulder into full elevation Relieving factors: not elevating or lifting the R arm  PERTINENT HISTORY:  recurrent L breast CA with node dissection 4/24, dilated cardiomyopathy, Afib, pacemaker, HTN, anxiety, LBP, sciatica, anxiety  PRECAUTIONS: ICD/Pacemaker, Lymphedema precautions LUE due to breast CA  RED FLAGS: None  HAND DOMINANCE: Right  WEIGHT BEARING RESTRICTIONS: No  FALLS:  Has patient fallen in last 6 months? Yes. Number of falls 1  LIVING ENVIRONMENT: Lives with: lives with their daughter Lives in: House/apartment Stairs: Yes: Internal: 12 steps; on right going up and External: 4 steps; on right going up Has following equipment at home: Single point cane and Walker - 2 wheeled  OCCUPATION: retired from investment banker, corporate work  PLOF: Independent with community mobility with device  PATIENT GOALS: get my shoulder pain resolved, and walk better on the R knee (OA)   OBJECTIVE: (objective measures completed at initial evaluation unless otherwise dated)  DIAGNOSTIC FINDINGS:  CLINICAL DATA:  Right shoulder pain after fall 5 days ago.   EXAM: RIGHT SHOULDER - 2+ VIEW   COMPARISON:  None Available.   FINDINGS: There is no evidence of fracture or dislocation. There is no evidence of arthropathy or other focal bone abnormality. Soft tissues are unremarkable.   IMPRESSION: Negative.     Electronically Signed   By: Lynwood Landy Raddle M.D.   On: 11/15/2023 15:45  PATIENT SURVEYS:  Modified Oswestry:  MODIFIED OSWESTRY DISABILITY SCALE  Date: 03/11/2024 Score  Pain intensity 2 =  Pain medication provides me with complete relief from pain.  2. Personal care (washing, dressing, etc.) 0 =  I can take care of myself normally without causing increased pain.  3. Lifting 4 = I can lift only very light weights   4. Walking 4 = I can only walk with crutches or a cane.  5. Sitting 0 =  I can sit in any chair as long as I like.  6. Standing 1 =  I can stand as long as I want but, it increases my pain.  7. Sleeping 0 = Pain does not prevent me from sleeping well.  8. Social Life 0 = My social life is normal and does not increase my pain.  9. Traveling 0 =  I can travel anywhere without increased pain.  10. Employment/ Homemaking 2 = I can perform most of my homemaking/job duties, but pain prevents me from performing more physically stressful activities (eg, lifting, vacuuming).  Total 13/50 = 26%   Interpretation of scores: Score Category Description  0-20% Minimal Disability The patient can cope with most living activities. Usually no treatment is indicated apart from advice on lifting, sitting and exercise  21-40% Moderate Disability The patient experiences more pain and difficulty with sitting, lifting and standing. Travel and social life are more difficult and they may be disabled from work. Personal care, sexual activity and sleeping are not grossly affected, and the patient can usually be managed by conservative means  41-60% Severe Disability Pain remains the main problem in this group, but activities of daily living are affected. These patients require a detailed investigation  61-80% Crippled Back pain impinges on all aspects  of the patients life. Positive intervention is required  81-100% Bed-bound  These patients are either bed-bound or exaggerating their symptoms  Bluford FORBES Zoe DELENA Karon DELENA, et al. Surgery versus conservative management of stable thoracolumbar fracture: the PRESTO feasibility RCT. [place] ([place]): [employer]; 2021 Nov. [facility] Technology Assessment, No. 25.62.) Appendix 3, Oswestry Disability Index category descriptors. Available from: Findjewelers.cz  Minimally Clinically Important Difference (MCID) = 12.8%  Quick Dash:  QUICK  DASH  Please rate your ability do the following activities in the last week by selecting the number below the appropriate response.   Activities Rating  Open a tight or new jar.  2 = Mild difficulty  Do heavy household chores (e.g., wash walls, floors). 5 = Unable  Carry a shopping bag or briefcase 1 = No difficulty   Wash your back. 1 = No difficulty   Use a knife to cut food. 1 = No difficulty   Recreational activities in which you take some force or impact through your arm, shoulder or hand (e.g., golf, hammering, tennis, etc.). 2 = Mild difficulty  During the past week, to what extent has your arm, shoulder or hand problem interfered with your normal social activities with family, friends, neighbors or groups?  3 = Moderately  During the past week, were you limited in your work or other regular daily activities as a result of your arm, shoulder or hand problem? 2 = Slightly limited  Rate the severity of the following symptoms in the last week: Arm, Shoulder, or hand pain. 2 = Mild  Rate the severity of the following symptoms in the last week: Tingling (pins and needles) in your arm, shoulder or hand. 2 = Mild  During the past week, how much difficulty have you had sleeping because of the pain in your arm, shoulder or hand?  1 = No difficulty   (A QuickDASH score may not be calculated if there is greater than 1 missing item.)  Quick Dash Disability/Symptom Score: [(sum of 22 (n) responses/11 (n)] x 25 = 25  Minimally Clinically Important Difference (MCID): 15-20 points  (Franchignoni, F. et al. (2013). Minimally clinically important difference of the disabilities of the arm, shoulder, and hand outcome measures (DASH) and its shortened version (Quick DASH). Journal of Orthopaedic & Sports Physical Therapy, 44(1), 30-39)       COGNITION: Overall cognitive status: Within functional limits for tasks assessed     SENSATION: WFL  POSTURE: rounded shoulders, forward head, and R  scoliosis  UPPER EXTREMITY ROM:   Active ROM Right eval Left eval RUE 02/19/24  Shoulder flexion 160 165 160  Shoulder extension 70 70 70  Shoulder abduction 150 165 160  Shoulder adduction     Shoulder internal rotation 100 100 95  Shoulder external rotation 100 110 104  Elbow flexion     Elbow extension     Wrist flexion     Wrist extension     Wrist ulnar deviation     Wrist radial deviation     Wrist pronation     Wrist supination     (Blank rows = not tested)  UPPER EXTREMITY MMT:  MMT Right eval Left eval RUE 02/19/24 R  9/24  Shoulder flexion 4- p! 5 4- but mininimal pain 4  Shoulder extension 4- 4 4   Shoulder abduction 4- p! 5 4- min to no pain 4-  Shoulder adduction      Shoulder internal rotation 4 5 4+ 4+  Shoulder external  rotation 3- p! 5 3 4-  Middle trapezius      Lower trapezius      Elbow flexion      Elbow extension      Wrist flexion      Wrist extension      Wrist ulnar deviation      Wrist radial deviation      Wrist pronation      Wrist supination      Grip strength (lbs)      (Blank rows = not tested) LUMBAR ROM:   Active  A/PROM  eval 02/19/24 03/11/24  Flexion 90% ROM 100% 100%; no pain  Extension 90% ROM 100% 100% no pain  Right lateral flexion To knee jt Knee joint To knee joint  Left lateral flexion Above knee jt Knee joint Above knee joint; p! Over L low back  Right rotation 90% 100% 100%  Left rotation 75% 88% 80%   (Blank rows = not tested)  LOWER EXTREMITY ROM:     Active  Right 02/19/24 Left 02/19/24  Hip flexion    Hip extension    Hip abduction    Hip adduction    Hip internal rotation    Hip external rotation    Knee flexion 122 129  Knee extension 0 0  Ankle dorsiflexion    Ankle plantarflexion    Ankle inversion    Ankle eversion     (Blank rows = not tested)  LOWER EXTREMITY MMT:    MMT Right 02/19/24 Left 02/19/24 RLE  03/11/24  Hip flexion 4 4 4   Hip extension     Hip abduction 3+ 3+ 3+  Hip  adduction     Hip internal rotation 4- 4- 4  Hip external rotation 4 4+ 4+  Knee flexion 4 4+ 4+  Knee extension 4; pain 5 4+  Ankle dorsiflexion     Ankle plantarflexion     Ankle inversion     Ankle eversion      (Blank rows = not tested)   SHOULDER SPECIAL TESTS: Impingement tests: Neer impingement test: positive , Hawkins/Kennedy impingement test: negative, and Painful arc test: negative SLAP lesions: Biceps load test: positive  Instability tests: Apprehension test: negative Rotator cuff assessment: Drop arm test: negative, Empty can test: positive , Full can test: positive , and Gerber lift off test: negative Biceps assessment: Speed's test: positive   LUMBAR SPECIAL TESTS:  Straight leg raise test: Negative   SLR RLE is 70 deg, LLE is 80 deg  FUNCTIONAL TESTING:  5X STS  = 21.35 sec  TUG = 17.53 sec-    JOINT MOBILITY TESTING:  hypermobile Bilateral shoulder joint accessory motions  PALPATION:  TTP over the R greater tuberosity, nontender over coracoid and posterior R shoulder;     TTP over L T-10 and T11 facets and paraspinals    TODAY'S TREATMENT:  03/11/24 Nustep L5x28min  Rechecked lumbar ROM Seated green swiss ball bouncing x 1' x 2 Seated green SB LAQ x 2/10 BLE Seated on mat table green SB rollouts diagonally each side and F/B X 10 EACH BUE Supine yellow swiss ball side to side rocking LE's x 20 BLE Supine yellow SB rollouts/knee flexion x 20 BLE Supine yellow SB bridge x 20 BLE Sidelying trunk rotation away x 3 w/ 10 sec holds bilaterally Seated R sidebending over bolster w/ overhead reach LUE x 30 sec x 3  HP x 15' to low back in supine  03/04/24 Nustep L5x13min  Leg  curls 10lb 2x15 BLE Leg ext 5lb 2x15 BLE Standing hip flex to abd to ext all in one motion x 5 BLE Standing march with trA set on handle bars x 10 B Mini squats UE support x 10  Seated B shoulder flexion 1lb 2x5 BUE Rows RTB seated 2x10  02/24/24 THERAPEUTIC EXERCISE: To improve  strength.  Demonstration, verbal and tactile cues throughout for technique. Nu-step L5 x 6' BUE/BLE  Seated knee extension 5# x 3/10 RLE Seated knee flexion 10# x 3/10 BLE Standing hip flexor stretch 1' x 2 RLE Seated hip flexor stretch x 1'  RLE Supine SLR 2# x 3/10 RLE Supine Peanut ball rollouts x 30 BLE Supine peanut bridge x 2/10 BLE  Supine 0# press up x 20 RUE Supine 1# circles at 90 CW x 10; CCW x 10 SL ER 0# with PT assist for concentric, patient does eccentric x 2/10 RUE  NEUROMUSCULAR RE-EDUCATION: To improve balance. Gait with SPC with facilitation for balance, leaning to the L for appropriate assistance; going over objects and around objects for balance and encouraement to use cane at home.   02/19/24 THERAPEUTIC EXERCISE: To improve strength.  Demonstration, verbal and tactile cues throughout for technique. Nu-step L3 x 6' BUE/BLE  THERAPEUTIC ACTIVITIES: To improve functional performance.  Demonstration, verbal and tactile cues throughout for technique. Recheck of R shoulder strength and ROM (see tables) Supine ER/IR 3# x 2/15 RUE Supine press up 0# x 2/10 RUE Circles at 90 deg flexion 3# x 15 CW; x15 CCW RUE w/ PT guidance Supine shoulder abd slides on mat to 90 deg x 2/10 RUE Strength and ROM checked BUE, BLE  HP to R shoulder and R knee in supine x 15'   PATIENT EDUCATION:  Education details: added mini squat, clock reach, march   Person educated: Patient Education method: Explanation, Demonstration, Verbal cues, Tactile cues, and Handouts Education comprehension: verbalized understanding, verbal cues required, tactile cues required, and needs further education  HOME EXERCISE PROGRAM: Access Code: [email or web address] URL: [email or web address] Date: 12/11/2023 Prepared by: Garnette Montclair  Exercises - Sidelying Shoulder Horizontal Abduction  - 1 x daily - 7 x weekly - 1 sets - 20 reps - Supine Shoulder Circles  - 1 x daily - 7 x weekly - 1 sets -  20 reps - Seated Shoulder External Rotation AAROM with Dowel  - 1 x daily - 7 x weekly - 1 sets - 20 reps - Seated Single Arm Shoulder External Rotation with Towel  - 1 x daily - 7 x weekly - 1 sets - 20 reps - Seated Shoulder External Rotation AROM in Supported Abduction  - 1 x daily - 7 x weekly - 1 sets - 20 reps   ASSESSMENT:  CLINICAL IMPRESSION:  Patient has been seen x 10 total PT visits over the last 3 months initially for R shoulder pain.  Then she received a referral for L LBP.  Then she requested therapy for her R knee OA.   She also has balance deficits.   We have tried to address each one of these problems for her as much as possible.   However, it is very difficult to make any progress when we are switching treatment from 1 body part to another every visit.   And she is only able to come in for therapy very sporadically due to high copay, out of town, etc.   Her R shoulder strength has remained essentially the same.  Her R quad strength has  improved by 1/2 grade.   She initially improved with her LBP, but is now worse again.  She has not had any falls.  She has met some of her goals   Sheila Schmidt is a 88 y.o. female who was referred to physical therapy for evaluation and treatment for R shoulder pain and L lower thoracic pain.    Patient reports onset of R shoulder pain beginning approximately 1 month ago s/p fall. Pain is worse with reaching overhead, lifting any weight with the RUE past shoulder level, and haircare.  Patient has deficits in lumbar flexibility, R shoulder strength, abnormal posture, and TTP with abnormal muscle tension R shoulder and L thoracic spine/BLE which are interfering with ADLs and are impacting quality of life.  On QuickDASH patient scored 20.5/100 demonstrating 20.5% disability.  Lemma will benefit from skilled PT to address above deficits to improve mobility and activity tolerance with decreased pain interference.   OBJECTIVE IMPAIRMENTS: Abnormal gait,  decreased balance, decreased strength, and postural dysfunction.   ACTIVITY LIMITATIONS: lifting, stairs, reach over head, and locomotion level  PARTICIPATION LIMITATIONS: cleaning, laundry, and making bed, vacuuming  PERSONAL FACTORS: Age and 1-2 comorbidities: recurrent L breast CA with node dissection 4/24, dilated cardiomyopathy, Afib, pacemaker, HTN, anxiety, LBP, anxiety are also affecting patient's functional outcome.   REHAB POTENTIAL: Good  CLINICAL DECISION MAKING: Evolving/moderate complexity  EVALUATION COMPLEXITY: Moderate   GOALS: Goals reviewed with patient? Yes  SHORT TERM GOALS: Target date: 03/21/24  Patient will be independent with HEP for R knee pain.  Baseline: will give HEP next visit since we have not been seeing her for knee pain Goal status: INITIAL  2.  Patient will report 25% improvement in R knee pain to improve QOL.   Baseline:  R knee is 5/10 Goal status: IN PROGRESS   LONG TERM GOALS: Target date: 04/18/24  Patient will be independent with ongoing/advanced HEP for self-management at home.  Baseline: has no advanced HEP for R knee;  still progressing with R shoulder Goal status: IN PROGRESS  2.  Patient will report 50-75% improvement in R shoulder and L thoracic pain to improve QOL.  Baseline: R knee 5/10;  R shoulder 3/10 with lifting weight overhead Goal status: IN PROGRESS  3.  Patient to demonstrate improved upright posture with posterior shoulder girdle engaged to promote improved glenohumeral joint mobility. Baseline: forward head/rounded shoulders 03/11/24:  improved shoulder positioning over evaluation visit Goal status: IN PROGRESS  4.  Patient to improve L thoracic sidebending and rotation to equal bilaterally/90% without pain provocation to allow for increased ease of ADLs.  Baseline: Refer to above lumbar ROM table 03/11/24:  remeasured today and targeted exercises given Goal status: IN PROGRESS  5.  Patient will demonstrate  improved R shoulder strength to >/= 4+/5 for functional UE use. Baseline: Refer to above UE MMT table Goal status: IN PROGRESS- 03/04/24 see table above  6  Patient will report </= 5%% on QuickDASH (MCID = 14%) to demonstrate improved functional ability.  Baseline: 20.5 02/24/24:      36.4 / 100 = 36.4 % 03/11/24:  25% Goal status: IN PROGRESS  7.  Patient will report no falls and improve on functional strength/balance testing (TBD next visit if able) to WNL for agegroup to reduce fall risk   Baseline: TBD  Goal status: INITIAL    8.  Patient will demonstrate 5/5 R knee quad strength for improved function and improved QOL   Baseline:  4/5  03/11/24:  4+   Goal Status: IN PROGRESS   9.  Patient will report 50-75% subjective improvement in her R knee pain   Baseline:  02/19/24:  6/10 R knee   Goal status:  IN PROGRESS   10.  Patient will report >/= 48/80 on LEFS (MCID = 9 pts ) for improved ability to ADL and improved QOL   Baseline:  38/80 03/11/24:  patient did not fully complete the survey before leaving.  Only answered 1/3 questions   Goal status:  IN PROGRESS   11.  Patient will improve in modified ODI score </=  14% (MCID = 12%) for improved function and QOL   Baseline: 26%   Goal status:  INITIAL 03/11/24 PLAN:  PT FREQUENCY: 1x/week  PT DURATION: 8 weeks  PLANNED INTERVENTIONS: 02835- PT Re-evaluation, 97750- Physical Performance Testing, 97110-Therapeutic exercises, 97530- Therapeutic activity, [id number]- Neuromuscular re-education, 97535- Self Care, 02859- Manual therapy, [phone]- Gait training, [phone]- Electrical stimulation (unattended), 97035- Ultrasound, 02987- Traction (mechanical), [id number]- Ionotophoresis 4mg /ml Dexamethasone , 79439 (1-2 muscles), 20561 (3+ muscles)- Dry Needling, Patient/Family education, Balance training, Stair training, Taping, Joint mobilization, Spinal mobilization, Cryotherapy, Moist heat, and Biofeedback  PLAN FOR NEXT SESSION: Spend next 3-4 visits  reviewing HEP for shoulder, back, and knee so that patient continue independently at D/C  PHYSICAL THERAPY DISCHARGE SUMMARY  Visits from Start of Care: 10  Current functional level related to goals / functional outcomes: Patient came in for 10 visits for a variety of problems including R shoulder pain, L thoracolumbar pain, R knee OA/pain, and balance deficits.   It was difficult for her to make any progress due to only being to come in for therapy once every week or 2 due to poor insurance coverage.   She has not returned since her last PT visit, but we would be happy to see her again PRN   Remaining deficits: Shoulder, back, knee pain are still bothering her, although the shoulder seems to have improved the most.  See above notes and goals for status   Education / Equipment: Independent with her HEP   Patient agrees to discharge. Patient goals were partially met. Patient is being discharged due to not returning since the last visit.   Timouthy Gilardi, PT 03/11/2024, 9:06 PM  "

## 2024-03-13 ENCOUNTER — Encounter: Payer: Self-pay | Admitting: Family Medicine

## 2024-03-18 ENCOUNTER — Encounter: Admitting: Rehabilitation

## 2024-03-25 ENCOUNTER — Encounter: Admitting: Rehabilitation

## 2024-03-31 ENCOUNTER — Ambulatory Visit: Admitting: Family Medicine

## 2024-04-10 ENCOUNTER — Other Ambulatory Visit: Payer: Self-pay

## 2024-04-10 DIAGNOSIS — Z170 Estrogen receptor positive status [ER+]: Secondary | ICD-10-CM

## 2024-04-13 ENCOUNTER — Inpatient Hospital Stay: Admitting: Hematology

## 2024-04-13 ENCOUNTER — Encounter: Payer: Self-pay | Admitting: Radiology

## 2024-04-13 ENCOUNTER — Inpatient Hospital Stay: Attending: Hematology

## 2024-04-13 VITALS — BP 130/74 | HR 71 | Temp 98.2°F | Resp 17 | Ht 62.0 in | Wt 184.5 lb

## 2024-04-13 DIAGNOSIS — Z170 Estrogen receptor positive status [ER+]: Secondary | ICD-10-CM | POA: Diagnosis not present

## 2024-04-13 DIAGNOSIS — C50412 Malignant neoplasm of upper-outer quadrant of left female breast: Secondary | ICD-10-CM

## 2024-04-13 DIAGNOSIS — R21 Rash and other nonspecific skin eruption: Secondary | ICD-10-CM | POA: Diagnosis not present

## 2024-04-13 DIAGNOSIS — Z79810 Long term (current) use of selective estrogen receptor modulators (SERMs): Secondary | ICD-10-CM | POA: Insufficient documentation

## 2024-04-13 DIAGNOSIS — R040 Epistaxis: Secondary | ICD-10-CM | POA: Diagnosis not present

## 2024-04-13 DIAGNOSIS — Z1732 Human epidermal growth factor receptor 2 negative status: Secondary | ICD-10-CM | POA: Insufficient documentation

## 2024-04-13 DIAGNOSIS — Z1721 Progesterone receptor positive status: Secondary | ICD-10-CM | POA: Insufficient documentation

## 2024-04-13 LAB — CBC WITH DIFFERENTIAL (CANCER CENTER ONLY)
Abs Immature Granulocytes: 0.03 K/uL (ref 0.00–0.07)
Basophils Absolute: 0.1 K/uL (ref 0.0–0.1)
Basophils Relative: 1 %
Eosinophils Absolute: 0.1 K/uL (ref 0.0–0.5)
Eosinophils Relative: 1 %
HCT: 40.7 % (ref 36.0–46.0)
Hemoglobin: 13.5 g/dL (ref 12.0–15.0)
Immature Granulocytes: 0 %
Lymphocytes Relative: 46 %
Lymphs Abs: 4.9 K/uL — ABNORMAL HIGH (ref 0.7–4.0)
MCH: 34.2 pg — ABNORMAL HIGH (ref 26.0–34.0)
MCHC: 33.2 g/dL (ref 30.0–36.0)
MCV: 103 fL — ABNORMAL HIGH (ref 80.0–100.0)
Monocytes Absolute: 0.7 K/uL (ref 0.1–1.0)
Monocytes Relative: 7 %
Neutro Abs: 4.8 K/uL (ref 1.7–7.7)
Neutrophils Relative %: 45 %
Platelet Count: 163 K/uL (ref 150–400)
RBC: 3.95 MIL/uL (ref 3.87–5.11)
RDW: 13.4 % (ref 11.5–15.5)
WBC Count: 10.7 K/uL — ABNORMAL HIGH (ref 4.0–10.5)
nRBC: 0 % (ref 0.0–0.2)

## 2024-04-13 LAB — CMP (CANCER CENTER ONLY)
ALT: 7 U/L (ref 0–44)
AST: 14 U/L — ABNORMAL LOW (ref 15–41)
Albumin: 3.7 g/dL (ref 3.5–5.0)
Alkaline Phosphatase: 38 U/L (ref 38–126)
Anion gap: 3 — ABNORMAL LOW (ref 5–15)
BUN: 17 mg/dL (ref 8–23)
CO2: 29 mmol/L (ref 22–32)
Calcium: 9.1 mg/dL (ref 8.9–10.3)
Chloride: 108 mmol/L (ref 98–111)
Creatinine: 1.07 mg/dL — ABNORMAL HIGH (ref 0.44–1.00)
GFR, Estimated: 51 mL/min — ABNORMAL LOW (ref >60)
Glucose, Bld: 84 mg/dL (ref 70–99)
Potassium: 3.9 mmol/L (ref 3.5–5.1)
Sodium: 140 mmol/L (ref 135–145)
Total Bilirubin: 0.9 mg/dL (ref 0.0–1.2)
Total Protein: 6.3 g/dL — ABNORMAL LOW (ref 6.5–8.1)

## 2024-04-13 NOTE — Progress Notes (Signed)
 [facility]   [phone] [phone]   Clinic Follow up Note   Patient Care Team: Copland, Harlene BROCKS, MD as PCP - General (Family Medicine) Cindie Ole DASEN, MD as PCP - Electrophysiology (Cardiology) Anderson Maude ORN, MD (Inactive) (Orthopedic Surgery) Rox Charleston, MD (Obstetrics and Gynecology) Vanderbilt Ned, MD (General Surgery) Rennie Parents, PA-C as Physician Assistant (Dermatology) Vanderbilt Ned, MD as Consulting Physician (General Surgery) Lanny Callander, MD as Consulting Physician (Hematology) Shannon Agent, MD as Consulting Physician (Radiation Oncology) Tyree Nanetta SAILOR, RN as Oncology Nurse Navigator Hanford Powell BRAVO, NP as Nurse Practitioner (Family Medicine)  Date of Service:  04/13/2024  CHIEF COMPLAINT: f/u of breast cancer   CURRENT THERAPY:  Adjuvant tamoxifen  20 mg daily  Oncology History   Malignant neoplasm of upper-outer quadrant of left breast in female, estrogen receptor positive (HCC) pT1bN1aM0, stage IA, ER+/PR+/HER2-, G2 -She was diagnosed in February 2024 -She underwent left breast lumpectomy and sentinel lymph node biopsy, I discussed her surgical findings with patient in detail.  Surgical margins were negative -Adjuvant radiation was recommended and offered, however due to her pacemaker on the same side of her breast cancer, the ultimate decision was not to proceed with adjuvant radiation due to her pacemaker and her advanced age. -Due to her advanced age, she is not a candidate for chemotherapy, so Oncotype was not obtained. -She started adjuvant tamoxifen  in early 01/20/2023, tolerating well overall, plan for 5 years  -Due to her new onset back pain, we obtained bone scan in May 2025 which was negative -continue cancer surveillance   Assessment & Plan Malignant neoplasm of upper-outer quadrant of left breast, status post surgery, on tamoxifen  therapy Status post surgery for malignant neoplasm of the  upper-outer quadrant of the left breast. Currently on tamoxifen  therapy, started on August 11th, 2024. Tolerating tamoxifen  well with minimal side effects, including occasional mild hot flashes. No significant issues reported. Blood counts are stable, and liver function tests are improved. Discussed the risk of endometrial cancer associated with tamoxifen , especially since she retains her uterus. - Continue tamoxifen  therapy for a total of five years, contingent on tolerance. - Monitor for any vaginal bleeding and report if it occurs. - Scheduled mammogram for mid-February 2026.  Epistaxis, left nostril Recent episode of epistaxis from the left nostril, likely exacerbated by anticoagulation therapy. Bleeding was significant but managed with ice application. No significant drop in blood counts, indicating mild bleeding. Possible contributing factor includes pressure from glasses. - Continue to monitor for any further episodes of epistaxis. - Advised on the use of ice for management of future episodes.  Rash under breast Rash under the breast, likely due to sweating. No significant discomfort reported. - Apply nystatin  powder under the breast to manage the rash.  Plan - She is clinically doing well, tolerating tamoxifen  well, will continue. - She follows her PCP every 3 to 6 months, I plan to see her back in 1 year. - I ordered next mammogram   SUMMARY OF ONCOLOGIC HISTORY: Oncology History Overview Note   Cancer Staging  Malignant neoplasm of upper-outer quadrant of left breast in female, estrogen receptor positive (HCC) Staging form: Breast, AJCC 8th Edition - Clinical stage from 07/30/2022: Stage IB (cT1b, cN1, cM0, G2, ER+, PR+, HER2-) - Signed by Lanny Callander, MD on 08/07/2022 Stage prefix: Initial diagnosis Histologic grading system: 3 grade system - Pathologic stage from 09/11/2022: Stage IA (pT1b, pN1a, cM0, G2, ER+, PR+, HER2-) - Signed by Lanny Callander, MD on  09/24/2022 Stage prefix: Initial  diagnosis Histologic grading system: 3 grade system Residual tumor (R): R0 - None     Malignant tumor of breast (HCC) (Resolved)  06/11/2009 Initial Diagnosis   Malignant tumor of breast (HCC)   Malignant neoplasm of upper-outer quadrant of left breast in female, estrogen receptor positive (HCC)  07/30/2022 Cancer Staging   Staging form: Breast, AJCC 8th Edition - Clinical stage from 07/30/2022: Stage IB (cT1b, cN1, cM0, G2, ER+, PR+, HER2-) - Signed by Lanny Callander, MD on 08/07/2022 Stage prefix: Initial diagnosis Histologic grading system: 3 grade system   08/06/2022 Initial Diagnosis   Malignant neoplasm of upper-outer quadrant of left breast in female, estrogen receptor positive (HCC)   08/23/2022 Genetic Testing   Negative [facility] Common Hereditary Cancers +RNA Panel.  VUS detected in PMS2 at c.791A>C (p.His264Pro) and POLE at c.444G>C (p.Leu148Phe).  Report date is 08/23/2022.    The [facility] Common Hereditary Cancers + RNA Panel includes sequencing, deletion/duplication, and RNA analysis of the following 48 genes: APC, ATM, AXIN2, BAP1, BARD1, BMPR1A, BRCA1, BRCA2, BRIP1, CDH1, CDK4*, CDKN2A*, CHEK2, CTNNA1, DICER1, EPCAM* (del/dup only), FH, GREM1* (promoter dup analysis only), HOXB13*, KIT*, MBD4*, MEN1, MLH1, MSH2, MSH3, MSH6, MUTYH, NF1, NTHL1, PALB2, PDGFRA*, PMS2, POLD1, POLE, PTEN, RAD51C, RAD51D, SDHA (sequencing only), SDHB, SDHC, SDHD, SMAD4, SMARCA4, STK11, TP53, TSC1, TSC2, VHL.  *Genes without RNA analysis.    09/11/2022 Cancer Staging   Staging form: Breast, AJCC 8th Edition - Pathologic stage from 09/11/2022: Stage IA (pT1b, pN1a, cM0, G2, ER+, PR+, HER2-) - Signed by Lanny Callander, MD on 09/24/2022 Stage prefix: Initial diagnosis Histologic grading system: 3 grade system Residual tumor (R): R0 - None   09/11/2022 Pathology Results     FINAL MICROSCOPIC DIAGNOSIS:  A. BREAST, LEFT, LUMPECTOMY: Invasive ductal carcinoma, 0.8 cm, grade II/III Ductal carcinoma in situ: Not  identified Margins, invasive: Negative     Closest, invasive: Posterior at 6 mm Margins, DCIS: N/A     Closest, DCIS: N/A Lymphovascular invasion: Suspicious for small arteriolar invasion Prognostic markers:  ER positive, PR positive, Her2 negative, Ki-67 20% % Other: N/A See oncology table  B. LYMPH NODE, LEFT AXILLA, SENTINEL, EXCISION: -  1 lymph node positive for malignancy, 18 mm in greatest dimension, with extracapsular extension (1/1).  C. LYMPH NODE, LEFT AXILLA, SENTINEL, EXCISION: -  1 lymph node positive for malignancy, 4 mm in greatest dimension (1/1).  D. LYMPH NODE, LEFT AXILLA, SENTINEL, EXCISION: -  1 lymph node negative for malignancy (0/1).  E. BREAST, LEFT ADDITIONAL MEDIAL MARGIN, EXCISION: -  Benign breast tissue, negative for malignancy. -  New additional margin thickness 7 mm.  F. BREAST, LEFT ADDITIONAL SUPERIOR MARGIN, EXCISION: -  Benign breast tissue, negative for malignancy. -  New additional margin thickness 13 mm.  G. BREAST, LEFT ADDITIONAL INFERIOR MARGIN, EXCISION: -  Benign breast tissue, negative for malignancy. -  New additional margin thickness 6 mm  H. BREAST, LEFT ADDITIONAL LATERAL MARGIN, EXCISION: -  Benign breast tissue, negative for malignancy. -  New additional margin thickness 5 mm.       Discussed the use of AI scribe software for clinical note transcription with the patient, who gave verbal consent to proceed.  History of Present Illness Sheila Schmidt is an 88 year old female with breast cancer who presents for follow-up. She is accompanied by her daughter, who is her primary caregiver.  She has been on tamoxifen  since January 20, 2023, with only occasional mild hot flashes. A  mammogram in February 2025 showed no abnormalities, and she is not due for another until next year. No vaginal bleeding is present.  Recently, she experienced significant epistaxis from her left nostril, which was managed with ice packs. Her  daughter noted a swollen spot inside the nostril.  She has a history of skin changes, with moles monitored by a dermatologist. One mole on her arm was removed, and another has increased in size.     All other systems were reviewed with the patient and are negative.  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   Dysrhythmia    Afib   Heart murmur    Hx of adenomatous colonic polyps    Hyperlipidemia    Presence of permanent cardiac pacemaker     SURGICAL HISTORY: Past Surgical History:  Procedure Laterality Date   ATRIAL FLUTTER ABLATION     BIV PACEMAKER INSERTION CRT-P N/A 12/04/2021   Procedure: BIV PACEMAKER INSERTION CRT-P;  Surgeon: Cindie Ole DASEN, MD;  Location: [facility];  Service: Cardiovascular;  Laterality: N/A;   BREAST BIOPSY Left 07/30/2022   US  LT BREAST BX W LOC DEV 1ST LESION IMG BX SPEC US  GUIDE 07/30/2022 [facility]   BREAST BIOPSY Left 09/10/2022   US  LT RADIOACTIVE SEED LOC 09/10/2022 [facility]MO[facility]   BREAST BIOPSY Left 09/10/2022   US  LT RADIOACTIVE SEED LOC 09/10/2022 [facility]   BREAST EXCISIONAL BIOPSY Left 2010   benign   BREAST LUMPECTOMY WITH RADIOACTIVE SEED AND SENTINEL LYMPH NODE BIOPSY Left 09/11/2022   Procedure: LEFT BREAST LUMPECTOMY WITH RADIOACTIVE SEED;  Surgeon: Vanderbilt Ned, MD;  Location: [facility] OR;  Service: General;  Laterality: Left;   CARDIOVERSION  05/2020   in [facility]   CARDIOVERSION N/A 06/21/2021   Procedure: CARDIOVERSION;  Surgeon: Barbaraann Darryle Ned, MD;  Location[facility] ENDOSCOPY;  Service: Cardiovascular;  Laterality: N/A;   CARDIOVERSION N/A 03/26/2022   Procedure: CARDIOVERSION;  Surgeon: Pietro Redell RAMAN, MD;  Location: [facility] ENDOSCOPY;  Service: Cardiovascular;  Laterality: N/A;   CHOLECYSTECTOMY     CHOLECYSTECTOMY, LAPAROSCOPIC  '06   [name]   KNEE  ARTHROSCOPY     Left '00 [place]/ Right '08   lumpectomy- remote     benign   RADIOACTIVE SEED GUIDED AXILLARY SENTINEL LYMPH NODE Left 09/11/2022   Procedure: RADIOACTIVE SEED GUIDED LEFT SENTINEL LYMPH NODE BIOPSY;  Surgeon: Vanderbilt Ned, MD;  Location: [facility] OR;  Service: General;  Laterality: Left;   TOOTH EXTRACTION     TOTAL KNEE ARTHROPLASTY  02/05/2011   left    I have reviewed the social history and family history with the patient and they are unchanged from previous note.  ALLERGIES:  has no known allergies.  MEDICATIONS:  Current Outpatient Medications  Medication Sig Dispense Refill   acetaminophen  (TYLENOL ) 500 MG tablet Take 500-1,000 mg by mouth as needed for moderate pain (pain score 4-6).     amiodarone  (PACERONE ) 100 MG tablet TAKE 1 TABLET BY MOUTH DAILY 90 tablet 0   atorvastatin  (LIPITOR) 20 MG tablet Take 1 tablet (20 mg total) by mouth daily. 90 tablet 0   calcium  carbonate (TUMS - DOSED IN MG ELEMENTAL CALCIUM ) 500 MG chewable tablet Chew 1 tablet by mouth as needed for indigestion or heartburn.     Calcium  Carbonate-Vit  D-Min (CALTRATE 600+D PLUS MINERALS) 600-800 MG-UNIT CHEW Chew 1 each by mouth daily.     Cholecalciferol 50 MCG (2000 UT) TABS Take 2,000 Units by mouth daily.     ENTRESTO  24-26 MG TAKE 1 TABLET BY MOUTH TWICE  DAILY 180 tablet 3   famotidine  (PEPCID ) 40 MG tablet Take 40 mg by mouth daily as needed for heartburn or indigestion.     furosemide  (LASIX ) 20 MG tablet Take 20 mg by mouth as needed for fluid or edema.     guaiFENesin -dextromethorphan  (ROBITUSSIN DM) 100-10 MG/5ML syrup Take 10 mLs by mouth every 6 (six) hours as needed for cough. 118 mL 0   lidocaine  (LIDODERM ) 5 % Place 1 patch onto the skin daily. Remove & Discard patch within 12 hours or as directed by MD 15 patch 0   melatonin 5 MG TABS Take 1.25 mg by mouth at bedtime as needed (sleep).     nystatin  (MYCOSTATIN /NYSTOP ) powder Apply 1 Application topically 3 (three) times daily.  TO skin rash at axilla or below breasts 60 g 1   rivaroxaban  (XARELTO ) 20 MG TABS tablet Take 1 tablet (20 mg total) by mouth daily with supper. 90 tablet 0   tamoxifen  (NOLVADEX ) 20 MG tablet TAKE 1 TABLET BY MOUTH DAILY 90 tablet 3   venlafaxine  XR (EFFEXOR -XR) 37.5 MG 24 hr capsule TAKE 1 CAPSULE BY MOUTH DAILY  WITH BREAKFAST 90 capsule 3   vitamin B-12 (CYANOCOBALAMIN ) 100 MCG tablet Take 100 mcg by mouth daily.     No current facility-administered medications for this visit.    PHYSICAL EXAMINATION: ECOG PERFORMANCE STATUS: 1 - Symptomatic but completely ambulatory  Vitals:   04/13/24 1300 04/13/24 1303  BP: (!) 143/76 130/74  Pulse: 71   Resp: 17   Temp: 98.2 F (36.8 C)   SpO2: 100%    Wt Readings from Last 3 Encounters:  04/13/24 184 lb 8 oz (83.7 kg)  03/09/24 178 lb 3.2 oz (80.8 kg)  12/06/23 180 lb (81.6 kg)     GENERAL:alert, no distress and comfortable SKIN: skin color, texture, turgor are normal, no rashes or significant lesions except of mild skin erythema underneath right breast EYES: normal, Conjunctiva are pink and non-injected, sclera clear NECK: supple, thyroid  normal size, non-tender, without nodularity LYMPH:  no palpable lymphadenopathy in the cervical, axillary  LUNGS: clear to auscultation and percussion with normal breathing effort HEART: regular rate & rhythm and no murmurs and no lower extremity edema ABDOMEN:abdomen soft, non-tender and normal bowel sounds Musculoskeletal:no cyanosis of digits and no clubbing  NEURO: alert & oriented x 3 with fluent speech, no focal motor/sensory deficits Breasts: Breast inspection showed them to be symmetrical with no nipple discharge. Palpation of the breasts and axilla revealed no obvious mass that I could appreciate.  Physical Exam   LABORATORY DATA:  I have reviewed the data as listed    Latest Ref Rng & Units 04/13/2024   12:20 PM 03/09/2024    2:41 PM 10/28/2023   12:43 PM  CBC  WBC 4.0 - 10.5 K/uL  10.7  8.8  10.0   Hemoglobin 12.0 - 15.0 g/dL 86.4  85.2  85.5   Hematocrit 36.0 - 46.0 % 40.7  44.4  42.6   Platelets 150 - 400 K/uL 163  165.0  180         Latest Ref Rng & Units 04/13/2024   12:20 PM 12/06/2023   11:52 AM 10/28/2023   12:43 PM  CMP  Glucose 70 -  99 mg/dL 84  77  80   BUN 8 - 23 mg/dL 17  19  22    Creatinine 0.44 - 1.00 mg/dL 8.92  9.05  8.84   Sodium 135 - 145 mmol/L 140  140  140   Potassium 3.5 - 5.1 mmol/L 3.9  4.1  4.6   Chloride 98 - 111 mmol/L 108  104  106   CO2 22 - 32 mmol/L 29  22  30    Calcium  8.9 - 10.3 mg/dL 9.1  9.3  9.2   Total Protein 6.5 - 8.1 g/dL 6.3  6.3  6.4   Total Bilirubin 0.0 - 1.2 mg/dL 0.9  0.9  1.1   Alkaline Phos 38 - 126 U/L 38  56  44   AST 15 - 41 U/L 14  17  15    ALT 0 - 44 U/L 7  10  10        RADIOGRAPHIC STUDIES: I have personally reviewed the radiological images as listed and agreed with the findings in the report. No results found.    Orders Placed This Encounter  Procedures   MM 3D DIAGNOSTIC MAMMOGRAM BILATERAL BREAST    [facility]; : 07/23/2023@[facility]  No need- no issues- yes hx of br ca- no implants- no reductions Spoke with pt/    Standing Status:   Future    Expected Date:   07/31/2024    Expiration Date:   04/13/2025    Reason for Exam (SYMPTOM  OR DIAGNOSIS REQUIRED):   screening    Preferred imaging location?:   [facility]   All questions were answered. The patient knows to call the clinic with any problems, questions or concerns. No barriers to learning was detected. The total time spent in the appointment was 25 minutes, including review of chart and various tests results, discussions about plan of care and coordination of care plan     Onita Mattock, MD 04/13/2024

## 2024-04-13 NOTE — Assessment & Plan Note (Addendum)
 pT1bN1aM0, stage IA, ER+/PR+/HER2-, G2 -She was diagnosed in February 2024 -She underwent left breast lumpectomy and sentinel lymph node biopsy, I discussed her surgical findings with patient in detail.  Surgical margins were negative -Adjuvant radiation was recommended and offered, however due to her pacemaker on the same side of her breast cancer, the ultimate decision was not to proceed with adjuvant radiation due to her pacemaker and her advanced age. -Due to her advanced age, she is not a candidate for chemotherapy, so Oncotype was not obtained. -She started adjuvant tamoxifen  in early 01/20/2023, tolerating well overall, plan for 5 years  -Due to her new onset back pain, we obtained bone scan in May 2025 which was negative -continue cancer surveillance

## 2024-04-16 ENCOUNTER — Ambulatory Visit: Payer: Self-pay

## 2024-04-16 NOTE — Telephone Encounter (Signed)
 FYI Only or Action Required?: FYI only for provider: appointment scheduled on 04/17/2024 at 11:20 AM.  Patient was last seen in primary care on 03/09/2024 by Copland, Harlene BROCKS, MD.  Called Nurse Triage reporting Epistaxis.  Symptoms began several days ago.  Interventions attempted: Rest, hydration, or home remedies.  Symptoms are: unchanged.  Triage Disposition: See Physician Within 24 Hours  Patient/caregiver understands and will follow disposition?: Yes  Reason for Triage: Patient is calling because she has been experiencing 3 nose bleeds in the last week one on Saturday, Sunday and today. Each time it occurred patient states she was not doing any physical activity, she was just sitting and each nose bleed lasted about 10 min before she was able to get it to stop. She currently was not experiencing a nose bleed and did not have any other symptoms.  Reason for Disposition  Taking Coumadin (warfarin) or other strong blood thinner, or known bleeding disorder (e.g., thrombocytopenia)  Answer Assessment - Initial Assessment Questions Patient reports three nosebleeds in the last week. Patient states she was able to control the bleeding within 10 minutes. Patient was scheduled for office visit tomorrow at 11:20 AM. Patient was educated on the concerns of bleeding while on blood thinners. Patient was instructed to follow up in the ED if she has another nosebleed before her appointment. Patient verbalized understanding.   1. AMOUNT OF BLEEDING: How bad is the bleeding? How much blood was lost? Has the bleeding stopped?     Bleeding has stopped. Patient states each nose bleed has taken about 10 minutes to stop 2. ONSET: When did the nosebleed start?      Has had three nosebleeds in the last week 3. FREQUENCY: How many nosebleeds have you had in the last 24 hours?      1 4. RECURRENT SYMPTOMS: Have there been other recent nosebleeds? If Yes, ask: How long did it take you to stop the  bleeding? What worked best?      Yes-took 10 minutes to get the bleeding to stop 5. CAUSE: What do you think caused this nosebleed?     unsure 6. LOCAL FACTORS: Do you have any cold symptoms?, Have you been rubbing or picking at your nose?     no 7. SYSTEMIC FACTORS: Do you have high blood pressure or any bleeding problems?     no 8. BLOOD THINNERS: Do you take any blood thinners? (e.g., aspirin, clopidogrel / Plavix, coumadin, heparin ). Notes: Other strong blood thinners include: Arixtra (fondaparinux), Eliquis (apixaban), Pradaxa (dabigatran), and Xarelto  (rivaroxaban ).     yes 9. OTHER SYMPTOMS: Do you have any other symptoms? (e.g., lightheadedness)     no  Protocols used: Nosebleed-A-AH

## 2024-04-17 ENCOUNTER — Ambulatory Visit: Admitting: Family Medicine

## 2024-04-17 VITALS — BP 112/78 | HR 72 | Temp 97.8°F | Resp 18 | Ht 62.0 in | Wt 182.6 lb

## 2024-04-17 DIAGNOSIS — Z23 Encounter for immunization: Secondary | ICD-10-CM | POA: Diagnosis not present

## 2024-04-17 DIAGNOSIS — R040 Epistaxis: Secondary | ICD-10-CM

## 2024-04-17 NOTE — Progress Notes (Signed)
 Subjective:    Patient ID: Sheila Schmidt, female    DOB: [birth year], 88 y.o.   MRN: [id number]  Chief Complaint  Patient presents with   Epistaxis    Pt states having 3 nosebleeds over the weekend. Bleeding stopped after 10 mins. Pt states current nose bleed was yesterday. No dizziness or headaches, no use of nasal sprays     HPI Patient is in today for recurrent nosebleeds.   Discussed the use of AI scribe software for clinical note transcription with the patient, who gave verbal consent to proceed.  History of Present Illness Sheila Schmidt is an 88 year old female who presents with recurrent epistaxis  She has experienced three episodes of epistaxis over the past week, occurring on Saturday, Sunday, and Monday, primarily from the left nostril. The episodes typically occur while she is at home and require the use of toilet paper to manage, taking approximately fifteen minutes to stop. She attributes the epistaxis to the dry air in her living environment due to constant heating.  She denies any associated symptoms such as headaches, lightheadedness, or congestion. No history of allergies. Despite a comment from someone suggesting she appeared swollen, she has not experienced any swelling or redness in the facial area.  She is currently on tamoxifen  and a blood thinner. She recently visited her oncologist, who noted that her blood counts were normal. There have been no recent changes in her medication regimen.  She has not received her flu shot yet but plans to do so. She has not experienced any adverse reactions to flu shots in the past.   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   Dysrhythmia    Afib   Heart murmur    Hx of adenomatous colonic polyps    Hyperlipidemia    Presence of permanent cardiac pacemaker     Past Surgical History:   Procedure Laterality Date   ATRIAL FLUTTER ABLATION     BIV PACEMAKER INSERTION CRT-P N/A 12/04/2021   Procedure: BIV PACEMAKER INSERTION CRT-P;  Surgeon: Cindie Ole DASEN, MD;  [facility]: [facility];  Service: Cardiovascular;  Laterality: N/A;   BREAST BIOPSY Left 07/30/2022   US  LT BREAST BX W LOC DEV 1ST LESION IMG BX SPEC US  GUIDE 07/30/2022 [facility]   BREAST BIOPSY Left 09/10/2022   US  LT RADIOACTIVE SEED LOC 09/10/2022 [facility]   BREAST BIOPSY Left 09/10/2022   US  LT RADIOACTIVE SEED LOC 09/10/2022 [facility]   BREAST EXCISIONAL BIOPSY Left 2010   benign   BREAST LUMPECTOMY WITH RADIOACTIVE SEED AND SENTINEL LYMPH NODE BIOPSY Left 09/11/2022   Procedure: LEFT BREAST LUMPECTOMY WITH RADIOACTIVE SEED;  Surgeon: Vanderbilt Ned, MD;  Location: [facility];  Service: General;  Laterality: Left;   CARD[facility]OVERSION  05/2020   [facility]   CARDIOVERSION N/A 06/21/2021   Procedure: CARDIOVERSION;  Surgeon: Barbaraann Darryle Ned, MD;  Location: [facility] ENDOSCOPY;  Service: Cardiovascular;  Laterality: N/A;   CARDIOVERSION N/A 03/26/2022   Procedure: CARDIOVERSION;  Surgeon: Pietro Redell RAMAN, MD;  Location: [facility] ENDOSCOPY;  Service: Cardiovascular;  Laterality: N/A;   CHOLECYSTECTOMY     CHOLECYSTECTOMY, LAPAROSCOPIC  '06   [name]   KNEE ARTHROSCOPY     Left '00 [place]/ Right '08   lumpectomy- remote  benign   RADIOACTIVE SEED GUIDED AXILLARY SENTINEL LYMPH NODE Left 09/11/2022   Procedure: RADIOACTIVE SEED GUIDED LEFT SENTINEL LYMPH NODE BIOPSY;  Surgeon: Vanderbilt Ned, MD;  Location: [facility] OR;  Service: General;  Laterality: Left;   TOOTH EXTRACTION     TOTAL KNEE ARTHROPLASTY  02/05/2011   left    Family History  Problem Relation Age of Onset   Diabetes Mother        Deceased in 56s   Heart disease Mother        cad/MI-fatal   Heart attack Mother    Liver cancer Father 2   Heart disease Sister    Breast cancer Neg Hx    Colon cancer Neg Hx     Social History    Socioeconomic History   Marital status: Widowed    Spouse name: Not on file   Number of children: 3   Years of education: Not on file   Highest education level: 12th grade  Occupational History   Occupation: retired     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 [employer]. Married '59, 3 daughters- '61, '64, '73; 2 grandchildren. Exercise - goes to gym 5/wk. ACP - directed to the [email or web address]   Lives with her daughter - goes between daughters home in [place] and her own home here   Social Drivers of Health   Financial Resource Strain: Low Risk  (04/17/2024)   Overall Financial Resource Strain (CARDIA)    Difficulty of Paying Living Expenses: Not very hard  Food Insecurity: Patient Declined (04/17/2024)   Hunger Vital Sign    Worried About Running Out of Food in the Last Year: Patient declined    Ran Out of Food in the Last Year: Patient declined  Transportation Needs: No Transportation Needs (04/17/2024)   PRAPARE - Administrator, Civil Service (Medical): No    Lack of Transportation (Non-Medical): No  Physical Activity: Sufficiently Active (04/17/2024)   Exercise Vital Sign    Days of Exercise per Week: 3 days    Minutes of Exercise per Session: 60 min  Stress: No Stress Concern Present (04/17/2024)   [employer] of Occupational Health - Occupational Stress Questionnaire    Feeling of Stress: Not at all  Social Connections: Socially Isolated (04/17/2024)   Social Connection and Isolation Panel    Frequency of Communication with Friends and Family: Twice a week    Frequency of Social Gatherings with Friends  and Family: Three times a week    Attends Religious Services: Patient declined    Active Member of Clubs or Organizations: No    Attends Banker Meetings: Not on file    Marital Status: Widowed  Intimate Partner Violence: Not At Risk (07/03/2023)   Humiliation, Afraid, Rape, and Kick questionnaire    Fear of Current or Ex-Partner: No    Emotionally Abused: No    Physically Abused: No    Sexually Abused: No    Outpatient Medications Prior to Visit  Medication Sig Dispense Refill   acetaminophen  (TYLENOL ) 500 MG tablet  Take 500-1,000 mg by mouth as needed for moderate pain (pain score 4-6).     amiodarone  (PACERONE ) 100 MG tablet TAKE 1 TABLET BY MOUTH DAILY 90 tablet 0   atorvastatin  (LIPITOR) 20 MG tablet Take 1 tablet (20 mg total) by mouth daily. 90 tablet 0   calcium  carbonate (TUMS - DOSED IN MG ELEMENTAL CALCIUM ) 500 MG chewable tablet Chew 1 tablet by mouth as needed for indigestion or heartburn.     Calcium  Carbonate-Vit D-Min (CALTRATE 600+D PLUS MINERALS) 600-800 MG-UNIT CHEW Chew 1 each by mouth daily.     Cholecalciferol 50 MCG (2000 UT) TABS Take 2,000 Units by mouth daily.     ENTRESTO  24-26 MG TAKE 1 TABLET BY MOUTH TWICE  DAILY 180 tablet 3   famotidine  (PEPCID ) 40 MG tablet Take 40 mg by mouth daily as needed for heartburn or indigestion.     furosemide  (LASIX ) 20 MG tablet Take 20 mg by mouth as needed for fluid or edema.     guaiFENesin -dextromethorphan  (ROBITUSSIN DM) 100-10 MG/5ML syrup Take 10 mLs by mouth every 6 (six) hours as needed for cough. 118 mL 0   lidocaine  (LIDODERM ) 5 % Place 1 patch onto the skin daily. Remove & Discard patch within 12 hours or as directed by MD 15 patch 0   melatonin 5 MG TABS Take 1.25 mg by mouth at bedtime as needed (sleep).     nystatin  (MYCOSTATIN /NYSTOP ) powder Apply 1 Application topically 3 (three) times daily. TO skin rash at axilla or below breasts 60 g 1   rivaroxaban  (XARELTO ) 20 MG TABS tablet Take 1 tablet (20  mg total) by mouth daily with supper. 90 tablet 0   tamoxifen  (NOLVADEX ) 20 MG tablet TAKE 1 TABLET BY MOUTH DAILY 90 tablet 3   venlafaxine  XR (EFFEXOR -XR) 37.5 MG 24 hr capsule TAKE 1 CAPSULE BY MOUTH DAILY  WITH BREAKFAST 90 capsule 3   vitamin B-12 (CYANOCOBALAMIN ) 100 MCG tablet Take 100 mcg by mouth daily.     No facility-administered medications prior to visit.    No Known Allergies  Review of Systems  Constitutional:  Negative for fever and malaise/fatigue.  HENT:  Negative for congestion.   Eyes:  Negative for blurred vision.  Respiratory:  Negative for shortness of breath.   Cardiovascular:  Negative for chest pain, palpitations and leg swelling.  Gastrointestinal:  Negative for abdominal pain, blood in stool and nausea.  Genitourinary:  Negative for dysuria and frequency.  Musculoskeletal:  Negative for falls.  Skin:  Negative for rash.  Neurological:  Negative for dizziness, loss of consciousness and headaches.  Endo/Heme/Allergies:  Negative for environmental allergies.  Psychiatric/Behavioral:  Negative for depression. The patient is not nervous/anxious.        Objective:    Physical Exam Vitals and nursing note reviewed.  Constitutional:      General: She is not in acute distress.    Appearance: Normal appearance. She is well-developed.  HENT:     Head: Normocephalic and atraumatic.     Nose: Nose normal.  Eyes:     General: No scleral icterus.       Right eye: No discharge.        Left eye: No discharge.  Cardiovascular:     Rate and Rhythm: Normal rate and regular rhythm.     Heart sounds: No murmur heard. Pulmonary:     Effort: Pulmonary effort is normal. No respiratory distress.     Breath sounds: Normal breath sounds.  Musculoskeletal:  General: Normal range of motion.     Cervical back: Normal range of motion and neck supple.     Right lower leg: No edema.     Left lower leg: No edema.  Skin:    General: Skin is warm and dry.   Neurological:     Mental Status: She is alert and oriented to person, place, and time.  Psychiatric:        Mood and Affect: Mood normal.        Behavior: Behavior normal.        Thought Content: Thought content normal.        Judgment: Judgment normal.     BP 112/78 (BP Location: Right Arm, Patient Position: Sitting, Cuff Size: Large)   Pulse 72   Temp 97.8 F (36.6 C) (Oral)   Resp 18   Ht 5' 2 (1.575 m)   Wt 182 lb 9.6 oz (82.8 kg)   SpO2 97%   BMI 33.40 kg/m  Wt Readings from Last 3 Encounters:  04/17/24 182 lb 9.6 oz (82.8 kg)  04/13/24 184 lb 8 oz (83.7 kg)  03/09/24 178 lb 3.2 oz (80.8 kg)    Diabetic Foot Exam - Simple   No data filed    Lab Results  Component Value Date   WBC 10.7 (H) 04/13/2024   HGB 13.5 04/13/2024   HCT 40.7 04/13/2024   PLT 163 04/13/2024   GLUCOSE 84 04/13/2024   CHOL 156 03/09/2024   TRIG 77.0 03/09/2024   HDL 58.60 03/09/2024   LDLCALC 82 03/09/2024   ALT 7 04/13/2024   AST 14 (L) 04/13/2024   NA 140 04/13/2024   K 3.9 04/13/2024   CL 108 04/13/2024   CREATININE 1.07 (H) 04/13/2024   BUN 17 04/13/2024   CO2 29 04/13/2024   TSH 2.730 12/06/2023   INR 1.9 (H) 07/04/2023   HGBA1C 5.2 03/09/2024    Lab Results  Component Value Date   TSH 2.730 12/06/2023   Lab Results  Component Value Date   WBC 10.7 (H) 04/13/2024   HGB 13.5 04/13/2024   HCT 40.7 04/13/2024   MCV 103.0 (H) 04/13/2024   PLT 163 04/13/2024   Lab Results  Component Value Date   NA 140 04/13/2024   K 3.9 04/13/2024   CHLORIDE 108 11/12/2013   CO2 29 04/13/2024   GLUCOSE 84 04/13/2024   BUN 17 04/13/2024   CREATININE 1.07 (H) 04/13/2024   BILITOT 0.9 04/13/2024   ALKPHOS 38 04/13/2024   AST 14 (L) 04/13/2024   ALT 7 04/13/2024   PROT 6.3 (L) 04/13/2024   ALBUMIN 3.7 04/13/2024   CALCIUM  9.1 04/13/2024   ANIONGAP 3 (L) 04/13/2024   EGFR 59 (L) 12/06/2023   GFR 54.89 (L) 12/06/2022   Lab Results  Component Value Date   CHOL 156  03/09/2024   Lab Results  Component Value Date   HDL 58.60 03/09/2024   Lab Results  Component Value Date   LDLCALC 82 03/09/2024   Lab Results  Component Value Date   TRIG 77.0 03/09/2024   Lab Results  Component Value Date   CHOLHDL 3 03/09/2024   Lab Results  Component Value Date   HGBA1C 5.2 03/09/2024       Assessment & Plan:  Frequent nosebleeds  Need for influenza vaccination -     Flu vaccine HIGH DOSE PF(Fluzone Trivalent)   Assessment and Plan Assessment & Plan Recurrent left-sided epistaxis   Epistaxis likely results from dry air  and a thin septum, worsened by blood thinners. Blood pressure and hemoglobin levels are normal, with no congestion or allergies. Bleeding occurs at home and is manageable with pressure and ice. Use a cool mist humidifier in the bedroom, apply saline spray during the day, and Vaseline at night to maintain moisture. Use Afrin nasal spray if bleeding is difficult to control. Referral to an ENT specialist for possible cauterization if epistaxis becomes more frequent or problematic.  Breast cancer on tamoxifen    She is currently on tamoxifen  for breast cancer. Blood counts are normal, and blood pressure is well-controlled. No new symptoms reported.  Encounter for immunization (influenza vaccine)   She has not received the flu shot this year and has no history of adverse reactions. Her daughter will be home by Monday, and she is willing to receive the vaccine. Administered influenza vaccine.  Jeidy Hoerner R Lowne Chase, DO

## 2024-04-23 ENCOUNTER — Other Ambulatory Visit: Payer: Self-pay

## 2024-04-23 ENCOUNTER — Encounter (HOSPITAL_BASED_OUTPATIENT_CLINIC_OR_DEPARTMENT_OTHER): Payer: Self-pay | Admitting: Emergency Medicine

## 2024-04-23 DIAGNOSIS — Z5321 Procedure and treatment not carried out due to patient leaving prior to being seen by health care provider: Secondary | ICD-10-CM | POA: Diagnosis not present

## 2024-04-23 DIAGNOSIS — R040 Epistaxis: Secondary | ICD-10-CM | POA: Diagnosis present

## 2024-04-23 NOTE — ED Triage Notes (Signed)
 BIB EMS from a restaurant with her daughter. Reports had 2 glasses of proseco.  Had several nosebleeds this week.  Pt was told by her doctor if she got another one she should come to the ED.  Has tissue in left nare and bleeding has not continued.  Pt is on xarelto 

## 2024-04-24 ENCOUNTER — Encounter: Payer: Self-pay | Admitting: Family Medicine

## 2024-04-24 ENCOUNTER — Emergency Department (HOSPITAL_BASED_OUTPATIENT_CLINIC_OR_DEPARTMENT_OTHER)
Admission: EM | Admit: 2024-04-24 | Discharge: 2024-04-24 | Discharge status: Against Medical Advice | Attending: Emergency Medicine | Admitting: Emergency Medicine

## 2024-04-24 DIAGNOSIS — R040 Epistaxis: Secondary | ICD-10-CM

## 2024-04-28 NOTE — Progress Notes (Unsigned)
 Sheila Schmidt [address] [place] [address] Phone: [phone] Subjective:   Sheila Schmidt, am serving as a scribe for Dr. Arthea Schmidt.  I'm seeing this patient by the request  of:  Copland, Sheila BROCKS, MD  CC: Right knee pain  YEP:Dlagzrupcz  02/20/2023 Patient given injections well noted at this point.  Discussed which activities to do and which ones to avoid.  We discussed with patient patient is ambulatory but is having instability of the knee.  Abnormal thigh to calf ratio noted and will get a custom OA stability brace that I think will be helpful.  Discussed which activities to do and which ones to avoid.  Follow-up again in 6 to 8 weeks.  Secondary to the social determinants of health and recent diagnosis of breast cancer we will also get an x-ray to further evaluate for any other bony abnormality that could be contributing.     Updated 04/29/2024 Sheila Schmidt is a 88 y.o. female coming in with complaint of R knee pain. Not doing the best. Injection didn't last very long. Has a Sheila Schmidt knee brace, does well with it but bothersome to put on.    Last x-rays are from September 2024 that showed moderate to severe tricompartmental osteoarthritic changes noted with effusion.   Patient was seen by another provider in November 2024 and given viscosupplementation.  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   Dysrhythmia    Afib   Heart murmur    Hx of adenomatous colonic polyps    Hyperlipidemia    Presence of permanent cardiac pacemaker    Past Surgical History:  Procedure Laterality Date   ATRIAL FLUTTER ABLATION     BIV PACEMAKER INSERTION CRT-P N/A 12/04/2021   Procedure: BIV PACEMAKER INSERTION CRT-P;  Surgeon: Cindie Ole DASEN, MD;  [facility]: [facility];  Service: Cardiovascular;   Laterality: N/A;   BREAST BIOPSY Left 07/30/2022   US  LT BREAST BX W LOC DEV 1ST LESION IMG BX SPEC US  GUIDE 07/30/2022 [facility]MO[facility]   BREAST BIOPSY Left 09/10/2022   US  LT RADIOACTIVE SEED LOC 09/10/2022 [facility]   BREAST BIOPSY Left 09/10/2022   US  LT RADIOACTIVE SEED LOC 09/10/2022 [facility]MO[facility]   BREAST EXCISIONAL BIOPSY Left 2010   benign   BREAST LUMPECTOMY WITH RADIOACTIVE SEED AND SENTINEL LYMPH NODE BIOPSY Left 09/11/2022   Procedure: LEFT BREAST LUMPECTOMY WITH RADIOACTIVE SEED;  Surgeon: Vanderbilt Ned, MD;  Location: [facility] OR;  Service: General;  Laterality: Left;   CARDIOVERSION  05/2020   in [facility]   CARDIOVERSION N/A 06/21/2021   Procedure: CARDIOVERSION;  Surgeon: Barbaraann Darryle Ned, MD;  Location: [facility] ENDOSCOPY;  Service: Cardiovascular;  Laterality: N/A;   CARDIOVERSION N/A 03/26/2022   Procedure: CARDIOVERSION;  Surgeon: Pietro Redell RAMAN, MD;  Location: [facility] ENDOSCOPY;  Service: Cardiovascular;  Laterality: N/A;   CHOLECYSTECTOMY     CHOLECYSTECTOMY, LAPAROSCOPIC  '06   Sheila Schmidt   KNEE ARTHROSCOPY     Left '00 Sheila Schmidt/ Right '08   lumpectomy- remote     benign   RADIOACTIVE SEED GUIDED AXILLARY SENTINEL LYMPH NODE Left 09/11/2022   Procedure: RADIOACTIVE SEED GUIDED LEFT SENTINEL LYMPH NODE BIOPSY;  Surgeon: Vanderbilt Ned, MD;  Location: [facility] OR;  Service: General;  Laterality: Left;  TOOTH EXTRACTION     TOTAL KNEE ARTHROPLASTY  02/05/2011   left   Social History   Socioeconomic History   Marital status: Widowed    Spouse name: Not on file   Number of children: 3   Years of education: Not on file   Highest education level: 12th grade  Occupational History   Occupation: retired     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 [employer]. Married '59, 3 daughters- '61, '64, '73; 2 grandchildren. Exercise - goes to gym 5/wk. ACP - directed to the [email or web address]   Lives with her daughter - goes between daughters home in [place] and her own home here   Social Drivers of Health   Financial Resource Strain: Low Risk  (04/17/2024)   Overall Financial Resource Strain (CARDIA)    Difficulty of Paying Living Expenses: Not very hard  Food Insecurity: Patient Declined (04/17/2024)   Hunger Vital Sign    Worried About Running Out of Food in the Last Year: Patient declined    Ran Out of Food in the Last Year: Patient declined  Transportation Needs: No Transportation Needs (04/17/2024)   PRAPARE - Administrator, Civil Service (Medical): No    Lack of Transportation (Non-Medical): No  Physical Activity: Sufficiently Active (04/17/2024)   Exercise Vital Sign    Days of Exercise per Week: 3 days    Minutes of Exercise per Session: 60 min  Stress: No Stress Concern Present (04/17/2024)   [employer] of Occupational Health - Occupational Stress Questionnaire    Feeling of Stress: Not at all  Social Connections: Socially Isolated (04/17/2024)   Social Connection and Isolation Panel    Frequency of Communication with Friends and Family: Twice a week    Frequency of Social Gatherings with Friends and Family: Three times a week    Attends Religious Services: Patient declined    Active Member of Clubs or Organizations: No    Attends Banker Meetings: Not on file    Marital Status: Widowed   No Known Allergies Family History  Problem Relation Age of Onset   Diabetes Mother        Deceased in 35s   Heart disease Mother        cad/MI-fatal   Heart attack Mother    Liver cancer Father 69   Heart disease Sister    Breast cancer Neg Hx    Colon cancer Neg Hx      Current  Outpatient Medications (Cardiovascular):    amiodarone  (PACERONE ) 100 MG tablet, TAKE 1 TABLET BY MOUTH DAILY   atorvastatin  (LIPITOR) 20 MG tablet, Take 1 tablet (20 mg total) by mouth daily.   ENTRESTO  24-26 MG, TAKE 1 TABLET BY MOUTH TWICE  DAILY   furosemide  (LASIX ) 20 MG tablet, Take 20 mg by mouth as needed for fluid or edema.  Current Outpatient Medications (Respiratory):    guaiFENesin -dextromethorphan  (ROBITUSSIN DM) 100-10 MG/5ML syrup, Take 10 mLs by mouth every 6 (six) hours as needed for cough.  Current Outpatient Medications (Analgesics):  acetaminophen  (TYLENOL ) 500 MG tablet, Take 500-1,000 mg by mouth as needed for moderate pain (pain score 4-6).  Current Outpatient Medications (Hematological):    rivaroxaban  (XARELTO ) 20 MG TABS tablet, Take 1 tablet (20 mg total) by mouth daily with supper.   vitamin B-12 (CYANOCOBALAMIN ) 100 MCG tablet, Take 100 mcg by mouth daily.  Current Outpatient Medications (Other):    calcium  carbonate (TUMS - DOSED IN MG ELEMENTAL CALCIUM ) 500 MG chewable tablet, Chew 1 tablet by mouth as needed for indigestion or heartburn.   Calcium  Carbonate-Vit D-Min (CALTRATE 600+D PLUS MINERALS) 600-800 MG-UNIT CHEW, Chew 1 each by mouth daily.   Cholecalciferol 50 MCG (2000 UT) TABS, Take 2,000 Units by mouth daily.   famotidine  (PEPCID ) 40 MG tablet, Take 40 mg by mouth daily as needed for heartburn or indigestion.   lidocaine  (LIDODERM ) 5 %, Place 1 patch onto the skin daily. Remove & Discard patch within 12 hours or as directed by MD   melatonin 5 MG TABS, Take 1.25 mg by mouth at bedtime as needed (sleep).   nystatin  (MYCOSTATIN /NYSTOP ) powder, Apply 1 Application topically 3 (three) times daily. TO skin rash at axilla or below breasts   tamoxifen  (NOLVADEX ) 20 MG tablet, TAKE 1 TABLET BY MOUTH DAILY   venlafaxine  XR (EFFEXOR -XR) 37.5 MG 24 hr capsule, TAKE 1 CAPSULE BY MOUTH DAILY  WITH BREAKFAST   Reviewed prior external information including  notes and imaging from  primary care provider As well as notes that were available from care everywhere and other healthcare systems.  Past medical history, social, surgical and family history all reviewed in electronic medical record.  No pertanent information unless stated regarding to the chief complaint.   Review of Systems:  No headache, visual changes, nausea, vomiting, diarrhea, constipation, dizziness, abdominal pain, skin rash, fevers, chills, night sweats, weight loss, swollen lymph nodes,  chest pain, shortness of breath, mood changes. POSITIVE muscle aches, joint swelling, body aches  Objective  Blood pressure 116/84, pulse 71, height 5' 2 (1.575 m), weight 182 lb (82.6 kg), SpO2 95%.   General: No apparent distress alert and oriented x3 mood and affect normal, dressed appropriately.  HEENT: Pupils equal, extraocular movements intact  Respiratory: Patient's speak in full sentences and does not appear short of breath  Cardiovascular: Trace lower extremity edema, non tender, no erythema  Right knee  exam shows significant arthritic changes noted.  Patient has abnormal thigh to calf ratio noted.  Severe tenderness to palpation diffusely on the knee.  Tender to palpation more on the medial aspect of the knee though.  Limited range of motion lacking last 5 degrees of extension.  After informed written and verbal consent, patient was seated on exam table. Right knee was prepped with alcohol swab and utilizing anterolateral approach, patient's right knee space was injected with 4:1  marcaine  0.5%: Kenalog  40mg /dL. Patient tolerated the procedure well without immediate complications.    Impression and Recommendations:    The above documentation has been reviewed and is accurate and complete Haven Foss M Paulita Licklider, DO

## 2024-04-29 ENCOUNTER — Ambulatory Visit: Admitting: Family Medicine

## 2024-04-29 ENCOUNTER — Encounter: Payer: Self-pay | Admitting: Family Medicine

## 2024-04-29 VITALS — BP 116/84 | HR 71 | Ht 62.0 in | Wt 182.0 lb

## 2024-04-29 DIAGNOSIS — M1711 Unilateral primary osteoarthritis, right knee: Secondary | ICD-10-CM

## 2024-04-29 DIAGNOSIS — G8929 Other chronic pain: Secondary | ICD-10-CM

## 2024-04-29 DIAGNOSIS — M25561 Pain in right knee: Secondary | ICD-10-CM

## 2024-04-29 NOTE — Patient Instructions (Addendum)
 Injection in knee See you again in 10-12 weeks Good to see you!

## 2024-04-29 NOTE — Assessment & Plan Note (Signed)
 Patient given injection and tolerated the procedure well.  Has failed all other conservative therapy.  Due to patient's age she would like to avoid a replacement if necessary.  We discussed with patient about the possibility of a geniculate arterial embolization and will see if she is a candidate for that and referred appropriately.  Discussed with patient about trying to use the custom brace on a more regular basis.  Hopefully patient responds well to the injection and we will get approval for viscosupplementation.  Follow-up again with me in 2 to 3 months

## 2024-05-11 ENCOUNTER — Ambulatory Visit: Attending: Cardiology | Admitting: Cardiology

## 2024-05-11 ENCOUNTER — Encounter: Payer: Self-pay | Admitting: Cardiology

## 2024-05-11 VITALS — BP 120/70 | HR 71 | Ht 62.0 in | Wt 177.0 lb

## 2024-05-11 DIAGNOSIS — I420 Dilated cardiomyopathy: Secondary | ICD-10-CM | POA: Diagnosis not present

## 2024-05-11 DIAGNOSIS — I10 Essential (primary) hypertension: Secondary | ICD-10-CM | POA: Diagnosis not present

## 2024-05-11 DIAGNOSIS — I480 Paroxysmal atrial fibrillation: Secondary | ICD-10-CM | POA: Diagnosis not present

## 2024-05-11 DIAGNOSIS — I4719 Other supraventricular tachycardia: Secondary | ICD-10-CM | POA: Insufficient documentation

## 2024-05-11 MED ORDER — AMIODARONE HCL 200 MG PO TABS: 200.0000 mg | ORAL_TABLET | Freq: Every day | ORAL | 3 refills | Status: AC

## 2024-05-11 NOTE — Addendum Note (Signed)
 Addended by: ARLOA PLANAS D on: 05/11/2024 10:23 AM   Modules accepted: Orders

## 2024-05-11 NOTE — Progress Notes (Signed)
 Cardiology Office Note:    Date:  05/11/2024   ID:  Sheila Schmidt, DOB 1935/09/06, MRN [id number]  PCP:  Watt Harlene BROCKS, MD  Cardiologist:  Lamar Fitch, MD    Referring MD: Watt Harlene BROCKS, MD   Chief Complaint  Patient presents with   Follow-up  Doing fine  History of Present Illness:    Sheila Schmidt is a 88 y.o. female past medical history significant for paroxysmal atrial fibrillation successfully suppressed with amiodarone , atrial tachycardia which is she has right now, history of cardiomyopathy reduced ejection fraction nonischemic initial ejection fraction 35% BiV pacing implanted in June 2023 since that time improvement to normalization.  Also history of breast cancer status postchemotherapy comes today to my office doing well.  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   Dysrhythmia    Afib   Heart murmur    Hx of adenomatous colonic polyps    Hyperlipidemia    Presence of permanent cardiac pacemaker     Past Surgical History:  Procedure Laterality Date   ATRIAL FLUTTER ABLATION     BIV PACEMAKER INSERTION CRT-P N/A 12/04/2021   Procedure: BIV PACEMAKER INSERTION CRT-P;  Surgeon: Cindie Ole DASEN, MD;  [facility];  Service: Cardiovascular;  Laterality: N/A;   BREAST BIOPSY Left 07/30/2022   US  LT BREAST BX W LOC DEV 1ST LESION IMG BX SPEC US  GUIDE 07/30/2022 [facility]   BREAST BIOPSY Left 09/10/2022   US  LT RADIOACTIVE SEED LOC 09/10/2022 [facility]   BREAST BIOPSY Left 09/10/2022   US  LT RADIOACTIVE SEED LOC 09/10/2022 [facility]AMMO[facility]   BREAST EXCISIONAL BIOPSY Left 2010   benign   BREAST  LUMPECTOMY WITH RADIOACTIVE SEED AND SENTINEL LYMPH NODE BIOPSY Left 09/11/2022   Procedure: LEFT BREAST LUMPECTOMY WITH RADIOACTIVE SEED;  Surgeon: Vanderbilt Ned, MD;  Location: [facility];  Service: General;  Laterality: Left;   CARDIOVERSION  05/2020   in [facility]   CARDIOVERSION N/A 06/21/2021   Procedure: CARDIOVERSION;  Surgeon: Barbaraann Darryle Ned, MD;  Location: [facility] ENDOSCOPY;  Service: Cardiovascular;  Laterality: N/A;   CARDIOVERSION N/A 03/26/2022   Procedure: CARDIOVERSION;  Surgeon: Pietro Redell RAMAN, MD;  Location: [facility] ENDOSCOPY;  Service: Cardiovascular;  Laterality: N/A;   CHOLECYSTECTOMY     CHOLECYSTECTOMY, LAPAROSCOPIC  '06   [name]   KNEE ARTHROSCOPY     Left '00 [place]/ Right '08   lumpectomy- remote     benign   RADIOACTIVE SEED GUIDED AXILLARY SENTINEL LYMPH NODE Left 09/11/2022   Procedure: RADIOACTIVE SEED GUIDED LEFT SENTINEL LYMPH NODE BIOPSY;  Surgeon: Vanderbilt Ned, MD;  Location: [facility] OR;  Service: General;  Laterality: Left;   TOOTH EXTRACTION     TOTAL KNEE ARTHROPLASTY  02/05/2011   left    Current Medications: Current Meds  Medication Sig   acetaminophen  (TYLENOL ) 500 MG tablet Take 500-1,000 mg by mouth as needed for moderate pain (pain score 4-6).   amiodarone  (PACERONE ) 100 MG tablet TAKE 1 TABLET BY MOUTH DAILY   atorvastatin  (LIPITOR) 20 MG tablet Take 1 tablet (20 mg total) by mouth daily.   calcium  carbonate (TUMS -  DOSED IN MG ELEMENTAL CALCIUM ) 500 MG chewable tablet Chew 1 tablet by mouth as needed for indigestion or heartburn.   Calcium  Carbonate-Vit D-Min (CALTRATE 600+D PLUS MINERALS) 600-800 MG-UNIT CHEW Chew 1 each by mouth daily.   Cholecalciferol 50 MCG (2000 UT) TABS Take 2,000 Units by mouth daily.   ENTRESTO  24-26 MG TAKE 1 TABLET BY MOUTH TWICE  DAILY   famotidine  (PEPCID ) 40 MG tablet Take 40 mg by mouth daily as needed for heartburn or indigestion.   furosemide  (LASIX ) 20 MG tablet Take 20 mg by mouth as needed for fluid or edema.    guaiFENesin -dextromethorphan  (ROBITUSSIN DM) 100-10 MG/5ML syrup Take 10 mLs by mouth every 6 (six) hours as needed for cough.   lidocaine  (LIDODERM ) 5 % Place 1 patch onto the skin daily. Remove & Discard patch within 12 hours or as directed by MD   melatonin 5 MG TABS Take 1.25 mg by mouth at bedtime as needed (sleep).   nystatin  (MYCOSTATIN /NYSTOP ) powder Apply 1 Application topically 3 (three) times daily. TO skin rash at axilla or below breasts   rivaroxaban  (XARELTO ) 20 MG TABS tablet Take 1 tablet (20 mg total) by mouth daily with supper.   tamoxifen  (NOLVADEX ) 20 MG tablet TAKE 1 TABLET BY MOUTH DAILY   venlafaxine  XR (EFFEXOR -XR) 37.5 MG 24 hr capsule TAKE 1 CAPSULE BY MOUTH DAILY  WITH BREAKFAST   vitamin B-12 (CYANOCOBALAMIN ) 100 MCG tablet Take 100 mcg by mouth daily.     Allergies:   Patient has no known allergies.   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 [employer]. Married '59, 3 daughters- '61, '64, '73; 2 grandchildren. Exercise - goes to gym 5/wk. ACP - directed to the [email or web address]   Lives with her daughter - goes between daughters home in [place] and her own home here   Social Drivers of Health   Financial Resource Strain: Low Risk  (04/17/2024)   Overall Financial Resource Strain (CARDIA)    Difficulty of Paying Living Expenses: Not very hard  Food Insecurity: Patient  Declined (04/17/2024)   Hunger Vital Sign    Worried About Running Out of Food in the Last Year: Patient declined    Ran Out of Food in the Last Year: Patient declined  Transportation Needs: No Transportation Needs (04/17/2024)   PRAPARE - Administrator, Civil Service (Medical): No    Lack of Transportation (Non-Medical): No  Physical Activity: Sufficiently Active (04/17/2024)   Exercise Vital Sign    Days of Exercise per Week: 3 days    Minutes of Exercise per Session: 60 min  Stress: No Stress Concern Present (04/17/2024)   [employer] of Occupational Health - Occupational Stress Questionnaire    Feeling of Stress: Not at all  Social  Connections: Socially Isolated (04/17/2024)   Social Connection and Isolation Panel    Frequency of Communication with Friends and Family: Twice a week    Frequency of Social Gatherings with Friends and Family: Three times a week    Attends Religious Services: Patient declined    Active Member of Clubs or Organizations: No    Attends Banker Meetings: Not on file    Marital Status: Widowed     Family History: The patient's family history includes Diabetes in her mother; Heart attack in her mother; Heart disease in her mother and sister; Liver cancer (age of onset: 98) in her father. There is no history of Breast cancer or Colon cancer. ROS:   Please see the history of present illness.    All 14 point review of systems negative except as described per history of present illness  EKGs/Labs/Other Studies Reviewed:         Recent Labs: 07/03/2023: B Natriuretic Peptide 314.5 07/04/2023: Magnesium 2.0 12/06/2023: TSH 2.730 04/13/2024: ALT 7; BUN 17; Creatinine 1.07; Hemoglobin 13.5; Platelet Count 163; Potassium 3.9; Sodium 140  Recent Lipid Panel    Component Value Date/Time   CHOL 156 03/09/2024 1441   TRIG 77.0 03/09/2024 1441   HDL 58.60 03/09/2024 1441   CHOLHDL 3 03/09/2024 1441   VLDL 15.4 03/09/2024 1441    LDLCALC 82 03/09/2024 1441    Physical Exam:    VS:  BP 120/70   Pulse 71   Ht 5' 2 (1.575 m)   Wt 177 lb (80.3 kg)   SpO2 99%   BMI 32.37 kg/m     Wt Readings from Last 3 Encounters:  05/11/24 177 lb (80.3 kg)  04/29/24 182 lb (82.6 kg)  04/17/24 182 lb 9.6 oz (82.8 kg)     GEN:  Well nourished, well developed in no acute distress HEENT: Normal NECK: No JVD; No carotid bruits LYMPHATICS: No lymphadenopathy CARDIAC: RRR, no murmurs, no rubs, no gallops RESPIRATORY:  Clear to auscultation without rales, wheezing or rhonchi  ABDOMEN: Soft, non-tender, non-distended MUSCULOSKELETAL:  No edema; No deformity  SKIN: Warm and dry LOWER EXTREMITIES: no swelling NEUROLOGIC:  Alert and oriented x 3 PSYCHIATRIC:  Normal affect   ASSESSMENT:    1. Paroxysmal atrial fibrillation (HCC)   2. Essential hypertension, benign   3. Dilated cardiomyopathy (HCC)   4. Atrial tachycardia    PLAN:    In order of problems listed above:  Paroxysmal atrial fibrillation, her EKG today show atrial tachycardia with 221 conduction with ventricular rate of 71.  I will increase dose of amiodarone  from 100 mg to 200 mg daily for a month bring her back in a month for EKG and reassessment.  I do not even get up push heart towards conversion her to sinus rhythm I will also get echocardiogram to assess left ventricular ejection fraction, if her ejection fraction is diminished then we may consider cardioversion. Dilated cardiomyopathy again echocardiogram will be done on guideline directed medical therapy that she can tolerate. Essential hypertension blood pressure well-controlled continue present management   Medication Adjustments/Labs and Tests Ordered: Current medicines are reviewed at length with the patient today.  Concerns regarding medicines are outlined above.  Orders Placed This Encounter  Procedures   EKG 12-Lead   Medication changes: No orders of the defined types were placed in this  encounter.   Signed, Lamar DOROTHA Fitch, MD, [facility] 05/11/2024 9:58 AM    [facility]

## 2024-05-11 NOTE — Patient Instructions (Addendum)
 Medication Instructions:   INCREASE: Amiodarone  to 200mg  daily   Lab Work: None Ordered If you have labs (blood work) drawn today and your tests are completely normal, you will receive your results only by: MyChart Message (if you have MyChart) OR A paper copy in the mail If you have any lab test that is abnormal or we need to change your treatment, we will call you to review the results.   Testing/Procedures: None Ordered   Follow-Up: At [facility], you and your health needs are our priority.  As part of our continuing mission to provide you with exceptional heart care, we have created designated Provider Care Teams.  These Care Teams include your primary Cardiologist (physician) and Advanced Practice Providers (APPs -  Physician Assistants and Nurse Practitioners) who all work together to provide you with the care you need, when you need it.  We recommend signing up for the patient portal called MyChart.  Sign up information is provided on this After Visit Summary.  MyChart is used to connect with patients for Virtual Visits (Telemedicine).  Patients are able to view lab/test results, encounter notes, upcoming appointments, etc.  Non-urgent messages can be sent to your provider as well.   To learn more about what you can do with MyChart, go to [email or web address].    Your next appointment:   2 month(s)  The format for your next appointment:   In Person  Provider:   Lamar Fitch, MD    Other Instructions NA

## 2024-05-14 ENCOUNTER — Encounter (INDEPENDENT_AMBULATORY_CARE_PROVIDER_SITE_OTHER): Payer: Self-pay

## 2024-05-15 ENCOUNTER — Institutional Professional Consult (permissible substitution) (INDEPENDENT_AMBULATORY_CARE_PROVIDER_SITE_OTHER)

## 2024-05-19 ENCOUNTER — Other Ambulatory Visit: Payer: Self-pay | Admitting: Family Medicine

## 2024-05-19 DIAGNOSIS — I480 Paroxysmal atrial fibrillation: Secondary | ICD-10-CM

## 2024-05-19 DIAGNOSIS — E785 Hyperlipidemia, unspecified: Secondary | ICD-10-CM

## 2024-06-01 ENCOUNTER — Ambulatory Visit: Payer: Self-pay | Admitting: Cardiology

## 2024-06-01 ENCOUNTER — Ambulatory Visit (HOSPITAL_BASED_OUTPATIENT_CLINIC_OR_DEPARTMENT_OTHER)
Admission: RE | Admit: 2024-06-01 | Discharge: 2024-06-01 | Disposition: A | Discharge status: Home / Self Care | Source: Ambulatory Visit | Attending: Cardiology | Admitting: Cardiology

## 2024-06-01 DIAGNOSIS — I420 Dilated cardiomyopathy: Secondary | ICD-10-CM | POA: Insufficient documentation

## 2024-06-01 DIAGNOSIS — I361 Nonrheumatic tricuspid (valve) insufficiency: Secondary | ICD-10-CM | POA: Diagnosis not present

## 2024-06-01 LAB — ECHOCARDIOGRAM COMPLETE
AR max vel: 1.93 cm2
AV Area VTI: 1.72 cm2
AV Area mean vel: 1.86 cm2
AV Mean grad: 5 mmHg
AV Peak grad: 8.8 mmHg
AV Vena cont: 0.3 cm
Ao pk vel: 1.49 m/s
Area-P 1/2: 4.8 cm2
Calc EF: 62.6 %
MV M vel: 3.95 m/s
MV Peak grad: 62.4 mmHg
S' Lateral: 3.1 cm
Single Plane A2C EF: 63.9 %
Single Plane A4C EF: 60.4 %

## 2024-06-02 ENCOUNTER — Ambulatory Visit: Payer: Medicare Other

## 2024-06-02 DIAGNOSIS — I480 Paroxysmal atrial fibrillation: Secondary | ICD-10-CM

## 2024-06-02 LAB — CUP PACEART REMOTE DEVICE CHECK
Battery Remaining Longevity: 65 mo
Battery Remaining Percentage: 68 %
Battery Voltage: 2.99 V
Brady Statistic AP VP Percent: 57 %
Brady Statistic AP VS Percent: 1 %
Brady Statistic AS VP Percent: 35 %
Brady Statistic AS VS Percent: 7.7 %
Brady Statistic RA Percent Paced: 29 %
Date Time Interrogation Session: 20251223040008
Implantable Lead Connection Status: 753985
Implantable Lead Connection Status: 753985
Implantable Lead Connection Status: 753985
Implantable Lead Implant Date: 20230626
Implantable Lead Implant Date: 20230626
Implantable Lead Implant Date: 20230626
Implantable Lead Location: 753858
Implantable Lead Location: 753859
Implantable Lead Location: 753860
Implantable Pulse Generator Implant Date: 20230626
Lead Channel Impedance Value: 390 Ohm
Lead Channel Impedance Value: 540 Ohm
Lead Channel Impedance Value: 810 Ohm
Lead Channel Pacing Threshold Amplitude: 0.5 V
Lead Channel Pacing Threshold Amplitude: 0.75 V
Lead Channel Pacing Threshold Amplitude: 1.125 V
Lead Channel Pacing Threshold Pulse Width: 0.5 ms
Lead Channel Pacing Threshold Pulse Width: 0.5 ms
Lead Channel Pacing Threshold Pulse Width: 0.5 ms
Lead Channel Sensing Intrinsic Amplitude: 1.3 mV
Lead Channel Sensing Intrinsic Amplitude: 12 mV
Lead Channel Setting Pacing Amplitude: 2 V
Lead Channel Setting Pacing Amplitude: 2.125
Lead Channel Setting Pacing Amplitude: 2.5 V
Lead Channel Setting Pacing Pulse Width: 0.5 ms
Lead Channel Setting Pacing Pulse Width: 0.5 ms
Lead Channel Setting Sensing Sensitivity: 2 mV
Pulse Gen Model: 3562
Pulse Gen Serial Number: 8071621

## 2024-06-02 NOTE — Telephone Encounter (Signed)
 Patient read results on MyChart today 06/02/24.  Alan, RN

## 2024-06-03 NOTE — Progress Notes (Signed)
 Remote PPM Transmission

## 2024-06-05 ENCOUNTER — Ambulatory Visit: Payer: Self-pay | Admitting: Cardiology

## 2024-06-16 ENCOUNTER — Ambulatory Visit (HOSPITAL_BASED_OUTPATIENT_CLINIC_OR_DEPARTMENT_OTHER)
Admission: RE | Admit: 2024-06-16 | Discharge: 2024-06-16 | Disposition: A | Discharge status: Home / Self Care | Source: Ambulatory Visit | Attending: Family Medicine | Admitting: Family Medicine

## 2024-06-16 ENCOUNTER — Encounter: Payer: Self-pay | Admitting: Family Medicine

## 2024-06-16 DIAGNOSIS — E2839 Other primary ovarian failure: Secondary | ICD-10-CM | POA: Diagnosis present

## 2024-06-16 DIAGNOSIS — M810 Age-related osteoporosis without current pathological fracture: Secondary | ICD-10-CM

## 2024-06-21 DIAGNOSIS — M810 Age-related osteoporosis without current pathological fracture: Secondary | ICD-10-CM | POA: Insufficient documentation

## 2024-06-21 MED ORDER — ALENDRONATE SODIUM 70 MG PO TABS: 70.0000 mg | ORAL_TABLET | ORAL | 3 refills | Status: DC

## 2024-06-25 ENCOUNTER — Encounter: Payer: Self-pay | Admitting: Cardiology

## 2024-06-25 NOTE — Progress Notes (Signed)
 " [facility] [address] [place] [address] Phone: [phone] Subjective:   Sheila Schmidt, am serving as a scribe for Dr. Arthea Claudene.  I'm seeing this patient by the request  of:  Copland, Harlene BROCKS, MD  CC: Right knee pain  YEP:Dlagzrupcz  04/29/2024 Patient given injection and tolerated the procedure well.  Has failed all other conservative therapy.  Due to patient's age she would like to avoid a replacement if necessary.  We discussed with patient about the possibility of a geniculate arterial embolization and will see if she is a candidate for that and referred appropriately.  Discussed with patient about trying to use the custom brace on a more regular basis.  Hopefully patient responds well to the injection and we will get approval for viscosupplementation.  Follow-up again with me in 2 to 3 months     Updated 06/30/2024 Sheila Schmidt is a 89 y.o. female coming in with complaint of R knee pain known arthritic changes.  Last injection this 10 weeks ago.  Patient states that she is the same as last visit. Had 2 weeks of relief from injection.        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   Dysrhythmia    Afib   Heart murmur    Hx of adenomatous colonic polyps    Hyperlipidemia    Presence of permanent cardiac pacemaker    Past Surgical History:  Procedure Laterality Date   ATRIAL FLUTTER ABLATION     BIV PACEMAKER INSERTION CRT-P N/A 12/04/2021   Procedure: BIV PACEMAKER INSERTION CRT-P;  Surgeon: Cindie Ole DASEN, MD;  [facility]: [facility];  Service: Cardiovascular;  Laterality: N/A;   BREAST BIOPSY Left 07/30/2022   US  LT BREAST BX W LOC DEV 1ST LESION IMG BX SPEC US  GUIDE 07/30/2022 [facility] MAMMOGRAPHY   BREAST BIOPSY Left 09/10/2022   US  LT RADIOACTIVE SEED LOC 09/10/2022 [facility]  MAMMOGRAPHY   BREAST BIOPSY Left 09/10/2022   US  LT RADIOACTIVE SEED LOC 09/10/2022 [facility] MAMMOGRAPHY   BREAST EXCISIONAL BIOPSY Left 2010   benign   BREAST LUMPECTOMY WITH RADIOACTIVE SEED AND SENTINEL LYMPH NODE BIOPSY Left 09/11/2022   Procedure: LEFT BREAST LUMPECTOMY WITH RADIOACTIVE SEED;  Surgeon: Vanderbilt Ned, MD;  Location: [facility];  Service: General;  Laterality: Left;   CARDIOVERSION  05/2020   in [facility]   CARDIOVERSION N/A 06/21/2021   Procedure: CARDIOVERSION;  Surgeon: Barbaraann Darryle Ned, MD;  Location: [facility] ENDOSCOPY;  Service: Cardiovascular;  Laterality: N/A;   CARDIOVERSION N/A 03/26/2022   Procedure: CARDIOVERSION;  Surgeon: Pietro Redell RAMAN, MD;  Location: [facility] ENDOSCOPY;  Service: Cardiovascular;  Laterality: N/A;   CHOLECYSTECTOMY     CHOLECYSTECTOMY, LAPAROSCOPIC  '06   [name]   KNEE ARTHROSCOPY     Left '00 [place]/ Right '08   lumpectomy- remote     benign   RADIOACTIVE SEED GUIDED AXILLARY SENTINEL LYMPH NODE Left 09/11/2022   Procedure: RADIOACTIVE SEED GUIDED LEFT SENTINEL LYMPH NODE BIOPSY;  Surgeon: Vanderbilt Ned, MD;  Location: [facility] OR;  Service: General;  Laterality: Left;   TOOTH EXTRACTION     TOTAL KNEE ARTHROPLASTY  02/05/2011   left   Social History   Socioeconomic History   Marital status: Widowed    Spouse name:  Not on file   Number of children: 3   Years of education: Not on file   Highest education level: 12th grade  Occupational History   Occupation: retired     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 HS[employer]. Married '59, 3 daughters- '61, '64, '73; 2 grandchildren. Exercise - goes to gym 5/wk. ACP -  directed to the [email or web address]   Lives with her daughter - goes between daughters home in [place] and her own home here   Social Drivers of Health   Tobacco Use: Low Risk (06/30/2024)   Patient History    Smoking Tobacco Use: Never    Smokeless Tobacco Use: Never    Passive Exposure: Never  Financial Resource Strain: Low Risk (04/17/2024)   Overall Financial Resource Strain (CARDIA)    Difficulty of Paying Living Expenses: Not very hard  Food Insecurity: Patient Declined (04/17/2024)   Epic    Worried About Programme Researcher, Broadcasting/film/video in the Last Year: Patient declined    Barista in the Last Year: Patient declined  Transportation Needs: No Transportation Needs (04/17/2024)   Epic    Lack of Transportation (Medical): No    Lack of Transportation (Non-Medical): No  Physical Activity: Sufficiently Active (04/17/2024)   Exercise Vital Sign    Days of Exercise per Week: 3 days    Minutes of Exercise per Session: 60 min  Stress: No Stress Concern Present (04/17/2024)   [employer] of Occupational Health - Occupational Stress Questionnaire    Feeling of Stress: Not at all  Social Connections: Socially Isolated (04/17/2024)   Social Connection and Isolation Panel    Frequency of Communication with Friends and Family: Twice a week    Frequency of Social Gatherings with Friends and Family: Three times a week    Attends Religious Services: Patient declined    Active Member of Clubs or Organizations: No    Attends Banker Meetings: Not on file    Marital Status: Widowed  Depression (PHQ2-9): Low Risk (03/09/2024)   Depression (PHQ2-9)    PHQ-2 Score: 0  Alcohol Screen: Low Risk (04/17/2024)   Alcohol Screen    Last Alcohol Screening Score (AUDIT): 2  Housing: Unknown (04/17/2024)   Epic    Unable to Pay for Housing in the Last Year: Patient declined    Number of Times Moved in the Last Year: 0    Homeless in the Last Year: No  Utilities: Not At Risk  (07/03/2023)   AHC Utilities    Threatened with loss of utilities: No  Health Literacy: Not on file   Allergies[1] Family History  Problem Relation Age of Onset   Diabetes Mother        Deceased in 65s   Heart disease Mother        cad/MI-fatal   Heart attack Mother    Liver cancer Father 64   Heart disease Sister    Breast cancer Neg Hx    Colon cancer Neg Hx     Current Outpatient Medications (Endocrine & Metabolic):  alendronate  (FOSAMAX ) 70 MG tablet, Take 1 tablet (70 mg total) by mouth every 7 (seven) days. Take with a full glass of water on an empty stomach.  Current Outpatient Medications (Cardiovascular):    amiodarone  (PACERONE ) 200 MG tablet, Take 1 tablet (200 mg total) by mouth daily.   atorvastatin  (LIPITOR) 20 MG tablet, TAKE 1 TABLET BY MOUTH DAILY   ENTRESTO  24-26 MG, TAKE 1 TABLET BY MOUTH TWICE  DAILY   furosemide  (LASIX ) 20 MG tablet, Take 20 mg by mouth as needed for fluid or edema.  Current Outpatient Medications (Respiratory):    guaiFENesin -dextromethorphan  (ROBITUSSIN DM) 100-10 MG/5ML syrup, Take 10 mLs by mouth every 6 (six) hours as needed for cough.  Current Outpatient Medications (Analgesics):    acetaminophen  (TYLENOL ) 500 MG tablet, Take 500-1,000 mg by mouth as needed for moderate pain (pain score 4-6).  Current Outpatient Medications (Hematological):    vitamin B-12 (CYANOCOBALAMIN ) 100 MCG tablet, Take 100 mcg by mouth daily.   XARELTO  20 MG TABS tablet, TAKE 1 TABLET BY MOUTH DAILY  WITH SUPPER  Current Outpatient Medications (Other):    calcium  carbonate (TUMS - DOSED IN MG ELEMENTAL CALCIUM ) 500 MG chewable tablet, Chew 1 tablet by mouth as needed for indigestion or heartburn.   Calcium  Carbonate-Vit D-Min (CALTRATE 600+D PLUS MINERALS) 600-800 MG-UNIT CHEW, Chew 1 each by mouth daily.   Cholecalciferol 50 MCG (2000 UT) TABS, Take 2,000 Units by mouth daily.   famotidine  (PEPCID ) 40 MG tablet, Take 40 mg by mouth daily as needed for  heartburn or indigestion.   lidocaine  (LIDODERM ) 5 %, Place 1 patch onto the skin daily. Remove & Discard patch within 12 hours or as directed by MD   melatonin 5 MG TABS, Take 1.25 mg by mouth at bedtime as needed (sleep).   nystatin  (MYCOSTATIN /NYSTOP ) powder, Apply 1 Application topically 3 (three) times daily. TO skin rash at axilla or below breasts   tamoxifen  (NOLVADEX ) 20 MG tablet, TAKE 1 TABLET BY MOUTH DAILY   venlafaxine  XR (EFFEXOR -XR) 37.5 MG 24 hr capsule, TAKE 1 CAPSULE BY MOUTH DAILY  WITH BREAKFAST   Reviewed prior external information including notes and imaging from  primary care provider As well as notes that were available from care everywhere and other healthcare systems.  Past medical history, social, surgical and family history all reviewed in electronic medical record.  No pertanent information unless stated regarding to the chief complaint.   Review of Systems:  No headache, visual changes, nausea, vomiting, diarrhea, constipation, dizziness, abdominal pain, skin rash, fevers, chills, night sweats, weight loss, swollen lymph nodes, body aches, joint swelling, chest pain, shortness of breath, mood changes. POSITIVE muscle aches  Objective  Blood pressure 122/66, pulse 61, height 5' 2 (1.575 m), weight 178 lb (80.7 kg), SpO2 95%.   General: No apparent distress alert and oriented x3 mood and affect normal, dressed appropriately.  HEENT: Pupils equal, extraocular movements intact  Respiratory: Patient's speak in full sentences and does not appear short of breath  Cardiovascular: No lower extremity edema, non tender, no erythema  Right knee exam shows patient does have arthritic changes noted.  Mild shuffling gait noted.  Patient does have some instability with valgus and varus force.  Right knee seems to be the most severe.  After informed written and verbal consent, patient was seated on exam table. Right knee was prepped with alcohol swab and utilizing  anterolateral approach, patient's right knee space was injected with 60 mg per 3 mL of [place] (  sodium hyaluronate) in a prefilled syringe was injected easily into the knee through a 22-gauge needle..Patient tolerated the procedure well without immediate complications.   Impression and Recommendations:     The above documentation has been reviewed and is accurate and complete Mattew Chriswell M Reita Shindler, DO       [1] No Known Allergies  "

## 2024-06-27 NOTE — Progress Notes (Unsigned)
 [facility] [address], [address] [place], [place] [address] [phone]  Date:  06/29/2024   Name:  Sheila Schmidt   DOB:  1936-01-09   MRN:  [id number]  PCP:  Watt Harlene BROCKS, MD    Chief Complaint: No chief complaint on file.   History of Present Illness:  Sheila Schmidt is a 89 y.o. very pleasant female patient who presents with the following:  Patient seen today with concerns about her bone density and height loss.  I saw her most recently in September Her daughter Devere is her main caretaker History of nonischemic cardiomyopathy with pacemaker placement in June 2023,also history of atrial fibrillation, hyperlipidemia, chronic knee pain from arthritis, osteopenia, breast cancer LEFT   She was found to have recurrent breast cancer 2024, had a lumpectomy in early April 2024 with a limited node dissection  Amiodarone  Lipitor Entresto  Tamoxifen  Venlafaxine  Xarelto  Fosamax   Shingrix Recommend COVID booster and RSV Flu, pneumonia up-to-date Mammogram is up-to-date  Most recent bone density scan was earlier this month- in osteoporosis range She has lost a bit of ground since her most previous recent scan in 2023 Patient declined to start on treatment in 2023 but most recently did except Fosamax , we just started this earlier this month  Discussed the use of AI scribe software for clinical note transcription with the patient, who gave verbal consent to proceed.  History of Present Illness    Patient Active Problem List   Diagnosis Date Noted   Osteoporosis 06/21/2024   Atrial tachycardia 05/11/2024   Acute respiratory failure with hypoxia (HCC) 07/03/2023   Acute hypoxic respiratory failure (HCC) 07/03/2023   Hypertensive disorder 01/15/2023   Dyslipidemia 01/15/2023   Genetic testing 09/18/2022   Pacemaker [employer] device BiV 09/06/2022   Malignant neoplasm of upper-outer quadrant of left breast in female,  estrogen receptor positive (HCC) 08/06/2022   Pure hypercholesterolemia 05/15/2022   Skin cancer 03/07/2022   Closed nondisplaced fracture of distal phalanx of right thumb 02/15/2022   Cancer (HCC) 05/25/2021   Left bundle branch block 04/12/2021   Dilated cardiomyopathy (HCC) 04/12/2021   Vasculitis 02/22/2021   Anxiety state 02/12/2021   Body fluid retention 02/12/2021   Cervical intraepithelial neoplasia 02/12/2021   Insomnia 02/12/2021   Pruritus of vulva 02/12/2021   Leg wound, right 10/03/2020   Bilateral primary osteoarthritis of knee 03/30/2019   Paroxysmal atrial fibrillation (HCC) 04/07/2015   Chronic anticoagulation 04/07/2015   Paroxysmal atrial flutter (HCC) 03/16/2015   Essential hypertension, benign 02/24/2014   Sciatica 12/08/2013   Hyperlipidemia 09/25/2013   Low back pain 01/26/2010   Carcinoma in situ of breast 09/04/2009   DEPRESSION, PROLONGED 09/04/2009   Degenerative arthritis of knee 09/04/2009   PERIPHERAL EDEMA 09/04/2009   History of colonic polyps 09/04/2009    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   Dysrhythmia    Afib   Heart murmur    Hx of adenomatous colonic polyps    Hyperlipidemia    Presence of permanent cardiac pacemaker     Past Surgical History:  Procedure Laterality Date   ATRIAL FLUTTER ABLATION     BIV PACEMAKER INSERTION CRT-P N/A 12/04/2021   Procedure: BIV PACEMAKER INSERTION CRT-P;  Surgeon: Cindie Ole DASEN, MD;  [facility]: [facility];  Service: Cardiovascular;  Laterality: N/A;   BREAST BIOPSY Left 07/30/2022   US  LT BREAST BX W LOC DEV 1ST LESION IMG BX SPEC US  GUIDE 07/30/2022 [facility]   BREAST BIOPSY Left 09/10/2022   US  LT RADIOACTIVE SEED LOC 09/10/2022 [facility]   BREAST BIOPSY Left 09/10/2022   US  LT RADIOACTIVE SEED LOC 09/10/2022 [facility]    BREAST EXCISIONAL BIOPSY Left 2010   benign   BREAST LUMPECTOMY WITH RADIOACTIVE SEED AND SENTINEL LYMPH NODE BIOPSY Left 09/11/2022   Procedure: LEFT BREAST LUMPECTOMY WITH RADIOACTIVE SEED;  Surgeon: Vanderbilt Ned, MD;  Location: [facility];  Service: General;  Laterality: Left;   CARD[facility]ERSION  05/2020   in [facility]   CARDIOVERSION N/A 06/21/2021   Procedure: CARDIOVERSION;  Surgeon: Barbaraann Darryle Ned, MD;  Location: [facility] ENDOSCOPY;  Service: Cardiovascular;  Laterality: N/A;   CARDIOVERSION N/A 03/26/2022   Procedure: CARDIOVERSION;  Surgeon: Pietro Redell RAMAN, MD;  Location: [facility] ENDOSCOPY;  Service: Cardiovascular;  Laterality: N/A;   CHOLECYSTECTOMY     CHOLECYSTECTOMY, LAPAROSCOPIC  '06   [name]   KNEE ARTHROSCOPY     Left '00 [place]/ Right '08   lumpectomy- remote     benign   RADIOACTIVE SEED GUIDED AXILLARY SENTINEL LYMPH NODE Left 09/11/2022   Procedure: RADIOACTIVE SEED GUIDED LEFT SENTINEL LYMPH NODE BIOPSY;  Surgeon: Vanderbilt Ned, MD;  Location: [facility] OR;  Service: General;  Laterality: Left;   TOOTH EXTRACTION     TOTAL KNEE ARTHROPLASTY  02/05/2011   left    Social History[1]  Family History  Problem Relation Age of Onset   Diabetes Mother        Deceased in 51s   Heart disease Mother        cad/MI-fatal   Heart attack Mother    Liver cancer Father 36   Heart disease Sister    Breast cancer Neg Hx    Colon cancer Neg Hx     Allergies[2]  Medication list has been reviewed and updated.  Medications Ordered Prior to Encounter[3]  Review of Systems:  ***  Physical Examination: There were no vitals filed for this visit. There were no vitals filed for this visit. There is no height or weight on file to calculate BMI. Ideal Body Weight:    ***  Assessment and Plan: No diagnosis found.  Assessment & Plan   Signed Harlene Schroeder, MD    [1]  Social History Tobacco Use   Smoking status: Never    Passive exposure: Never   Smokeless tobacco: Never   Vaping Use   Vaping status: Never Used  Substance Use Topics   Alcohol use: Yes    Comment: 1 glass per day   Drug use: No  [2] No Known Allergies [3]  Current Outpatient Medications on File Prior to Visit  Medication Sig Dispense Refill   acetaminophen  (TYLENOL ) 500 MG tablet Take 500-1,000 mg by mouth as needed for moderate pain (pain score 4-6).     alendronate  (FOSAMAX ) 70 MG tablet Take 1 tablet (70 mg total) by mouth every 7 (seven) days. Take with a full glass of water on an empty stomach. 12 tablet 3   amiodarone  (PACERONE ) 200 MG tablet Take 1 tablet (200 mg total) by mouth daily. 90 tablet 3   atorvastatin  (LIPITOR) 20 MG tablet TAKE 1 TABLET BY MOUTH DAILY 90 tablet 3   calcium  carbonate (TUMS - DOSED IN MG ELEMENTAL CALCIUM ) 500 MG chewable tablet Chew 1  tablet by mouth as needed for indigestion or heartburn.     Calcium  Carbonate-Vit D-Min (CALTRATE 600+D PLUS MINERALS) 600-800 MG-UNIT CHEW Chew 1 each by mouth daily.     Cholecalciferol 50 MCG (2000 UT) TABS Take 2,000 Units by mouth daily.     ENTRESTO  24-26 MG TAKE 1 TABLET BY MOUTH TWICE  DAILY 180 tablet 3   famotidine  (PEPCID ) 40 MG tablet Take 40 mg by mouth daily as needed for heartburn or indigestion.     furosemide  (LASIX ) 20 MG tablet Take 20 mg by mouth as needed for fluid or edema.     guaiFENesin -dextromethorphan  (ROBITUSSIN DM) 100-10 MG/5ML syrup Take 10 mLs by mouth every 6 (six) hours as needed for cough. 118 mL 0   lidocaine  (LIDODERM ) 5 % Place 1 patch onto the skin daily. Remove & Discard patch within 12 hours or as directed by MD 15 patch 0   melatonin 5 MG TABS Take 1.25 mg by mouth at bedtime as needed (sleep).     nystatin  (MYCOSTATIN /NYSTOP ) powder Apply 1 Application topically 3 (three) times daily. TO skin rash at axilla or below breasts 60 g 1   tamoxifen  (NOLVADEX ) 20 MG tablet TAKE 1 TABLET BY MOUTH DAILY 90 tablet 3   venlafaxine  XR (EFFEXOR -XR) 37.5 MG 24 hr capsule TAKE 1 CAPSULE BY MOUTH  DAILY  WITH BREAKFAST 90 capsule 3   vitamin B-12 (CYANOCOBALAMIN ) 100 MCG tablet Take 100 mcg by mouth daily.     XARELTO  20 MG TABS tablet TAKE 1 TABLET BY MOUTH DAILY  WITH SUPPER 90 tablet 3   No current facility-administered medications on file prior to visit.   "

## 2024-06-29 ENCOUNTER — Encounter: Payer: Self-pay | Admitting: Family Medicine

## 2024-06-29 ENCOUNTER — Ambulatory Visit: Admitting: Family Medicine

## 2024-06-29 VITALS — BP 110/62 | HR 61 | Temp 98.1°F | Ht 62.0 in | Wt 178.6 lb

## 2024-06-29 DIAGNOSIS — M810 Age-related osteoporosis without current pathological fracture: Secondary | ICD-10-CM

## 2024-06-29 DIAGNOSIS — R040 Epistaxis: Secondary | ICD-10-CM

## 2024-06-29 DIAGNOSIS — I10 Essential (primary) hypertension: Secondary | ICD-10-CM | POA: Diagnosis not present

## 2024-06-29 DIAGNOSIS — I480 Paroxysmal atrial fibrillation: Secondary | ICD-10-CM

## 2024-06-29 MED ORDER — ALENDRONATE SODIUM 70 MG PO TABS: 70.0000 mg | ORAL_TABLET | ORAL | 3 refills | Status: AC

## 2024-06-29 NOTE — Patient Instructions (Addendum)
 It was good to see you again today- best of luck with your ENT appt  Ok to take some of your meds different times of the day if your stomach is bothered by taking so much in the morning   I would recommend that you get the Shingles vaccine series at your pharmacy at your convenience   If you like we can go up on your Effexor - just keep me posted

## 2024-06-30 ENCOUNTER — Ambulatory Visit: Admitting: Family Medicine

## 2024-06-30 ENCOUNTER — Encounter: Payer: Self-pay | Admitting: Family Medicine

## 2024-06-30 ENCOUNTER — Telehealth: Payer: Self-pay

## 2024-06-30 VITALS — BP 122/66 | HR 61 | Ht 62.0 in | Wt 178.0 lb

## 2024-06-30 DIAGNOSIS — M1711 Unilateral primary osteoarthritis, right knee: Secondary | ICD-10-CM

## 2024-06-30 MED ORDER — SODIUM HYALURONATE 60 MG/3ML IX PRSY
60.0000 mg | PREFILLED_SYRINGE | Freq: Once | INTRA_ARTICULAR | Status: AC
  Administered 2024-06-30: 60 mg via INTRA_ARTICULAR

## 2024-06-30 NOTE — Patient Instructions (Addendum)
 Good to see you. PT at [facility] Injected right knee with Durolane today.  See me again in 10 to 12 weeks.

## 2024-06-30 NOTE — Telephone Encounter (Signed)
 Durolane benefits ran for right knee case [id number]

## 2024-06-30 NOTE — Assessment & Plan Note (Signed)
 Injection given today and tolerated the procedure well, discussed with patient that it will take some time to notice it appropriately.  I discussed the fullness feeling.  Does use the aid of some type of a cane or walker.  Will be traveling in the near future.  Follow-up again in 6 to 8 weeks

## 2024-07-01 ENCOUNTER — Ambulatory Visit (INDEPENDENT_AMBULATORY_CARE_PROVIDER_SITE_OTHER)

## 2024-07-01 ENCOUNTER — Encounter (INDEPENDENT_AMBULATORY_CARE_PROVIDER_SITE_OTHER): Payer: Self-pay

## 2024-07-01 VITALS — BP 121/79 | HR 60 | Ht 62.0 in | Wt 178.0 lb

## 2024-07-01 DIAGNOSIS — R040 Epistaxis: Secondary | ICD-10-CM | POA: Diagnosis not present

## 2024-07-01 DIAGNOSIS — Z7901 Long term (current) use of anticoagulants: Secondary | ICD-10-CM | POA: Diagnosis not present

## 2024-07-01 DIAGNOSIS — H9193 Unspecified hearing loss, bilateral: Secondary | ICD-10-CM

## 2024-07-01 MED ORDER — AYR SALINE NASAL NA GEL: 1.0000 | Freq: Every evening | NASAL | 0 refills | Status: AC

## 2024-07-01 MED ORDER — MUPIROCIN 2 % EX OINT: 1.0000 | TOPICAL_OINTMENT | Freq: Two times a day (BID) | CUTANEOUS | 0 refills | Status: AC

## 2024-07-01 NOTE — Telephone Encounter (Signed)
 Durolane authorized right knee NO PRE CERT REQUIRED  Patient responsible for 20% coinsurance Deductible $190 has met $0 OOP Max $3090 has met $0 Deductible must be met before coverage applies Once OOP has been met coverage goes to 100% Reference # [id number]

## 2024-07-01 NOTE — Progress Notes (Signed)
 Dear Dr. Watt, Here is my assessment for our mutual patient, Sheila Schmidt. Thank you for allowing me the opportunity to care for your patient. Please do not hesitate to contact me should you have any other questions. Sincerely, Dr. Hadassah Parody  Otolaryngology Clinic Note Referring provider: Dr. Watt HPI:   Initial HPI (07/01/2024)  89 year old female on Xarelto  presents for recurrent left-sided epistaxis.  She is with her daughter.  Experiences recurrent sudden onset episodes of profuse bleeding from the left nasal cavity since the fall of last year.  In November she had an episode that required ambulance transport to the emergency department.  Home management includes humidifier/diffuser with essential oils, Vaseline.  Also reports occasional difficulty hearing.  No recent audiologic assessment.  No otalgia or other otologic symptoms.    Independent Review of Additional Tests or Records:  Referral note 06/29/24 Jessica Koplin, MD: Experiencing recurrent epistaxis primarily from the left side severe enough to require emergency care.   PMH/Meds/All/SocHx/FamHx/ROS:   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   Dysrhythmia    Afib   Heart murmur    Hx of adenomatous colonic polyps    Hyperlipidemia    Presence of permanent cardiac pacemaker      Past Surgical History:  Procedure Laterality Date   ATRIAL FLUTTER ABLATION     BIV PACEMAKER INSERTION CRT-P N/A 12/04/2021   Procedure: BIV PACEMAKER INSERTION CRT-P;  Surgeon: Cindie Ole DASEN, MD;  Location: [facility];  Service: Cardiovascular;  Laterality: N/A;   BREAST BIOPSY Left 07/30/2022   US  LT BREAST BX W LOC DEV 1ST LESION IMG BX SPEC US  GUIDE 07/30/2022 [facility]   BREAST BIOPSY Left 09/10/2022   US  LT RADIOACTIVE SEED LOC 09/10/2022 [facility]   BREAST  BIOPSY Left 09/10/2022   US  LT RADIOACTIVE SEED LOC 09/10/2022 [facility]   BREAST EXCISIONAL BIOPSY Left 2010   benign   BREAST LUMPECTOMY WITH RADIOACTIVE SEED AND SENTINEL LYMPH NODE BIOPSY Left 09/11/2022   Procedure: LEFT BREAST LUMPECTOMY WITH RADIOACTIVE SEED;  Surgeon: Vanderbilt Ned, MD;  Location: [facility];  Service: General;  Laterality: Left;   CARD[facility]OVERSION  05/2020   in [facility]   CARDIOVERSION N/A 06/21/2021   Procedure: CARDIOVERSION;  Surgeon: Barbaraann Darryle Ned, MD;  Location: [facility] ENDOSCOPY;  Service: Cardiovascular;  Laterality: N/A;   CARDIOVERSION N/A 03/26/2022   Procedure: CARDIOVERSION;  Surgeon: Pietro Redell RAMAN, MD;  Location: [facility] ENDOSCOPY;  Service: Cardiovascular;  Laterality: N/A;   CHOLECYSTECTOMY     CHOLECYSTECTOMY, LAPAROSCOPIC  '06   [name]   KNEE ARTHROSCOPY     Left '00 [place]/ Right '08   lumpectomy- remote     benign   RADIOACTIVE SEED GUIDED AXILLARY SENTINEL LYMPH NODE Left 09/11/2022   Procedure: RADIOACTIVE SEED GUIDED LEFT SENTINEL LYMPH NODE BIOPSY;  Surgeon: Vanderbilt Ned, MD;  Location: [facility];  Service: General;  Laterality: Left;   TOOTH EXTRACTION     TOTAL KNEE ARTHROPLASTY  02/05/2011   left    Family History  Problem Relation Age of Onset   Diabetes Mother        Deceased in 40s   Heart disease Mother        cad/MI-fatal   Heart attack Mother    Liver cancer Father 98   Heart disease  Sister    Breast cancer Neg Hx    Colon cancer Neg Hx      Social Connections: Socially Isolated (04/17/2024)   Social Connection and Isolation Panel    Frequency of Communication with Friends and Family: Twice a week    Frequency of Social Gatherings with Friends and Family: Three times a week    Attends Religious Services: Patient declined    Active Member of Clubs or Organizations: No    Attends Banker Meetings: Not on file    Marital Status: Widowed     Current Outpatient Medications  Medication Instructions   acetaminophen   (TYLENOL ) 500-1,000 mg, As needed   alendronate  (FOSAMAX ) 70 mg, Oral, Every 7 days, Take with a full glass of water on an empty stomach.   amiodarone  (PACERONE ) 200 mg, Oral, Daily   atorvastatin  (LIPITOR) 20 mg, Oral, Daily   calcium  carbonate (TUMS - DOSED IN MG ELEMENTAL CALCIUM ) 500 MG chewable tablet 1 tablet, As needed   Calcium  Carbonate-Vit D-Min (CALTRATE 600+D PLUS MINERALS) 600-800 MG-UNIT CHEW 1 each, Daily   Cholecalciferol 2,000 Units, Daily   ENTRESTO  24-26 MG 1 tablet, Oral, 2 times daily   famotidine  (PEPCID ) 40 mg, Daily PRN   furosemide  (LASIX ) 20 mg, As needed   guaiFENesin -dextromethorphan  (ROBITUSSIN DM) 100-10 MG/5ML syrup 10 mLs, Oral, Every 6 hours PRN   lidocaine  (LIDODERM ) 5 % 1 patch, Transdermal, Every 24 hours, Remove & Discard patch within 12 hours or as directed by MD   melatonin 1.25 mg, At bedtime PRN   mupirocin  ointment (BACTROBAN ) 2 % 1 Application, Topical, 2 times daily   nystatin  (MYCOSTATIN /NYSTOP ) powder 1 Application, Topical, 3 times daily, TO skin rash at axilla or below breasts   saline (AYR) GEL 1 Application, Each Nare, Nightly   tamoxifen  (NOLVADEX ) 20 mg, Oral, Daily   venlafaxine  XR (EFFEXOR -XR) 37.5 mg, Oral, Daily with breakfast   vitamin B-12 (CYANOCOBALAMIN ) 100 mcg, Daily   Xarelto  20 mg, Oral, Daily with supper     Physical Exam:   BP 121/79 (BP Location: Right Arm, Patient Position: Sitting)   Pulse 60   Ht 5' 2 (1.575 m)   Wt 178 lb (80.7 kg)   SpO2 94%   BMI 32.56 kg/m   Salient findings:  CN II-XII intact   Bilateral EAC clear and TM intact with well pneumatized middle ear spaces  Anterior rhinoscopy: Septum with prominent vasculature with scabbing on the left anterior aspect; bilateral inferior turbinates with mild hypertrophy  No lesions of oral cavity/oropharynx  No obviously palpable neck masses/lymphadenopathy/thyromegaly  No respiratory distress or stridor  Seprately Identifiable Procedures:  Prior to  initiating any procedures, risks/benefits/alternatives were explained to the patient and verbal consent obtained.  PROCEDURE NOTE (07/01/2024): Preoperative diagnosis: left sided epistaxis Postoperative diagnosis: same  Procedure: control of nasal hemorrhage anterior, simple (CPT 30901) Indication: Epistaxis EBL: 0 mL Surgeon: Hadassah Parody, MD  Procedure: The patient was identified and properly positioned and verbal consent obtained. Topical lidocaine  and Afrin were applied to the nasal cavity  bilaterally and allowed to work for 10 minutes. The area over the  left anterior septum which was the suspected source of bleeding was focally cauterized using silver nitrate cautery with help of nasal speculum and headlight. There was no significant bleeding afterwards. Mupirocin  ointment was applied over the area. Patient tolerated the procedure well   Impression & Plans:  Sheila Schmidt is a 89 y.o. female with     ICD-10-CM   1.  Bilateral hearing loss, unspecified hearing loss type  H91.93 Ambulatory referral to Audiology    2. Recurrent epistaxis  R04.0      Assessment and Plan Assessment & Plan Recurrent left-sided epistaxis Recurrent left-sided epistaxis is likely multifactorial, attributed to nasal dryness during winter and anticoagulation with Xarelto , which increases severity and frequency. A scab on the left anterior nasal septum was identified as the source. Chemical cauterization was recommended to promote healing and reduce recurrence, with ongoing preventive measures necessary due to persistent bleeding risk from anticoagulation. The procedure may cause mild discomfort but is generally well tolerated. - Performed chemical cauterization of the left anterior nasal septum. - Prescribed antibiotic ointment for application to the left nostril for three days using the pad of the finger, avoiding Q-tips. - Prescribed saline nasal gel for nightly use after completion of the antibiotic  ointment to maintain nasal hydration. - Provided education on epistaxis management: advised to keep Afrin nasal spray available and, during an episode, administer two sprays and apply firm pressure to the soft part of the nose for five minutes. - Instructed to avoid inserting tissues or objects into the nostril and to allow clots to remain in place for several hours to facilitate healing. - Provided anticipatory guidance regarding increased bleeding risk due to Xarelto  and emphasized the importance of preventive measures. - Arranged for pharmacy to dispense antibiotic ointment and saline gel.  Bilateral hearing loss She reports difficulty hearing. Otoscopic examination revealed healthy-appearing ears. Further evaluation is required to determine the degree and type of hearing loss. - Ordered audiologic evaluation. - Scheduled follow-up in two months to review audiologic results and reassess hearing loss.    See below regarding exact medications prescribed this encounter including dosages and route: Meds ordered this encounter  Medications   mupirocin  ointment (BACTROBAN ) 2 %    Sig: Apply 1 Application topically 2 (two) times daily for 3 days.    Dispense:  14 g    Refill:  0   saline (AYR) GEL    Sig: Place 1 Application into both nostrils at bedtime.    Dispense:  14.1 g    Refill:  0      Thank you for allowing me the opportunity to care for your patient. Please do not hesitate to contact me should you have any other questions.  Sincerely, Hadassah Parody, MD Otolaryngologist (ENT), [facility] Phone: [phone] Fax: [phone]  MDM:  Level 4 Complexity/Problems addressed: 4 -2 chronic problems Data complexity: 3-  independent review of referral note, ordered audiogram - Morbidity: 4- med mgmt  - Prescription Drug prescribed or managed: yes

## 2024-07-14 ENCOUNTER — Ambulatory Visit: Admitting: Cardiology

## 2024-07-28 ENCOUNTER — Ambulatory Visit: Admitting: Rehabilitation

## 2024-08-17 ENCOUNTER — Ambulatory Visit (INDEPENDENT_AMBULATORY_CARE_PROVIDER_SITE_OTHER): Admitting: Audiology

## 2024-08-17 ENCOUNTER — Ambulatory Visit (INDEPENDENT_AMBULATORY_CARE_PROVIDER_SITE_OTHER)

## 2024-08-25 ENCOUNTER — Ambulatory Visit: Admitting: Cardiology

## 2024-09-14 ENCOUNTER — Ambulatory Visit: Admitting: Family Medicine

## 2025-04-13 ENCOUNTER — Inpatient Hospital Stay

## 2025-04-13 ENCOUNTER — Inpatient Hospital Stay: Admitting: Hematology
